# Patient Record
Sex: Female | Born: 1935
Health system: Southern US, Community
[De-identification: ages and names within clinical notes are randomized; demographics above are authoritative.]

## PROBLEM LIST (undated history)

## (undated) DIAGNOSIS — I10 Essential (primary) hypertension: Secondary | ICD-10-CM

## (undated) DIAGNOSIS — E079 Disorder of thyroid, unspecified: Secondary | ICD-10-CM

## (undated) HISTORY — DX: Essential (primary) hypertension: I10

## (undated) HISTORY — DX: Disorder of thyroid, unspecified: E07.9

## (undated) HISTORY — PX: APPENDECTOMY: SHX54

## (undated) MED FILL — Leucovorin Calcium For Inj 350 MG: INTRAMUSCULAR | Qty: 31.5 | Status: AC

## (undated) MED FILL — Fluorouracil IV Soln 5 GM/100ML (50 MG/ML): INTRAVENOUS | Qty: 76 | Status: AC

## (undated) MED FILL — Dexamethasone Sodium Phosphate Inj 100 MG/10ML: INTRAMUSCULAR | Qty: 1 | Status: AC

## (undated) MED FILL — Fluorouracil IV Soln 5 GM/100ML (50 MG/ML): INTRAVENOUS | Qty: 77 | Status: AC

## (undated) MED FILL — Fluorouracil IV Soln 2.5 GM/50ML (50 MG/ML): INTRAVENOUS | Qty: 13 | Status: AC

## (undated) MED FILL — Nivolumab IV Soln 100 MG/10ML: INTRAVENOUS | Qty: 24 | Status: AC

## (undated) MED FILL — Fosaprepitant Dimeglumine For IV Infusion 150 MG (Base Eq): INTRAVENOUS | Qty: 5 | Status: AC

---

## 1998-02-25 ENCOUNTER — Ambulatory Visit (HOSPITAL_COMMUNITY): Admission: RE | Admit: 1998-02-25 | Discharge: 1998-02-25 | Payer: Self-pay | Admitting: Family Medicine

## 1999-03-09 ENCOUNTER — Encounter: Payer: Self-pay | Admitting: Family Medicine

## 1999-03-09 ENCOUNTER — Encounter: Admission: RE | Admit: 1999-03-09 | Discharge: 1999-03-09 | Payer: Self-pay | Admitting: Family Medicine

## 2000-11-18 ENCOUNTER — Encounter: Payer: Self-pay | Admitting: Internal Medicine

## 2000-11-18 ENCOUNTER — Encounter: Admission: RE | Admit: 2000-11-18 | Discharge: 2000-11-18 | Payer: Self-pay | Admitting: Internal Medicine

## 2001-04-06 ENCOUNTER — Other Ambulatory Visit: Admission: RE | Admit: 2001-04-06 | Discharge: 2001-04-06 | Payer: Self-pay | Admitting: Internal Medicine

## 2001-06-27 ENCOUNTER — Ambulatory Visit (HOSPITAL_COMMUNITY): Admission: RE | Admit: 2001-06-27 | Discharge: 2001-06-27 | Payer: Self-pay | Admitting: Gastroenterology

## 2001-07-08 ENCOUNTER — Emergency Department (HOSPITAL_COMMUNITY): Admission: EM | Admit: 2001-07-08 | Discharge: 2001-07-08 | Payer: Self-pay

## 2002-01-16 ENCOUNTER — Encounter: Payer: Self-pay | Admitting: Internal Medicine

## 2002-01-16 ENCOUNTER — Encounter: Admission: RE | Admit: 2002-01-16 | Discharge: 2002-01-16 | Payer: Self-pay | Admitting: Internal Medicine

## 2003-01-15 ENCOUNTER — Encounter: Admission: RE | Admit: 2003-01-15 | Discharge: 2003-01-15 | Payer: Self-pay | Admitting: Internal Medicine

## 2003-01-30 ENCOUNTER — Encounter: Admission: RE | Admit: 2003-01-30 | Discharge: 2003-01-30 | Payer: Self-pay | Admitting: Internal Medicine

## 2004-07-29 ENCOUNTER — Encounter: Admission: RE | Admit: 2004-07-29 | Discharge: 2004-07-29 | Payer: Self-pay | Admitting: Internal Medicine

## 2005-02-19 ENCOUNTER — Encounter: Admission: RE | Admit: 2005-02-19 | Discharge: 2005-02-19 | Payer: Self-pay | Admitting: Internal Medicine

## 2005-08-23 ENCOUNTER — Encounter: Admission: RE | Admit: 2005-08-23 | Discharge: 2005-08-23 | Payer: Self-pay | Admitting: Internal Medicine

## 2006-09-28 ENCOUNTER — Encounter: Admission: RE | Admit: 2006-09-28 | Discharge: 2006-09-28 | Payer: Self-pay | Admitting: Internal Medicine

## 2008-11-29 ENCOUNTER — Encounter: Admission: RE | Admit: 2008-11-29 | Discharge: 2008-11-29 | Payer: Self-pay | Admitting: Internal Medicine

## 2010-03-07 ENCOUNTER — Encounter: Payer: Self-pay | Admitting: Internal Medicine

## 2010-04-14 ENCOUNTER — Other Ambulatory Visit: Payer: Self-pay | Admitting: Internal Medicine

## 2010-04-14 DIAGNOSIS — Z1231 Encounter for screening mammogram for malignant neoplasm of breast: Secondary | ICD-10-CM

## 2010-06-09 ENCOUNTER — Ambulatory Visit
Admission: RE | Admit: 2010-06-09 | Discharge: 2010-06-09 | Disposition: A | Discharge status: Home / Self Care | Payer: Medicare Other | Source: Ambulatory Visit | Attending: Internal Medicine | Admitting: Internal Medicine

## 2010-06-09 DIAGNOSIS — Z1231 Encounter for screening mammogram for malignant neoplasm of breast: Secondary | ICD-10-CM

## 2010-07-03 NOTE — Procedures (Signed)
[facility]  Patient:    Mackenzie Lewis, Mackenzie Lewis Visit Number: [id number] MRN: [id number]          Service Type: END Location: ENDO Attending Physician:  Charna Elizabeth Proc. Date: 06/27/01 Admit Date:  06/27/2001   CC:         Velna Hatchet, M.D.   Procedure Report  DATE OF BIRTH:  February 14, 2036.  PROCEDURE PERFORMED:  Colonoscopy.  ENDOSCOPIST:  Anselmo Rod, M.D.  INSTRUMENT USED:  Olympus colonoscope.  INDICATIONS FOR PROCEDURE:  Rectal bleeding in a 75 year old white female, rule out colonic polyps, masses, hemorrhoids, etc.  PREPROCEDURE PREPARATION:  Informed consent was procured from the patient. The patient was fasted for eight hours prior to the procedure, and prepped with a bottle of magnesium of citrate and a gallon of NuLytely the night prior to the procedure.  PREPROCEDURE PHYSICAL:  VITAL SIGNS:  The patient had stable vital signs.  NECK:  Supple.  CHEST:  Clear to auscultation.  CARDIAC:  S1 and S2 is regular.  ABDOMEN:  Soft with normal bowel sounds.  DESCRIPTION OF THE PROCEDURE:   The patient was placed in the left lateral decubitus position and sedated with 50 mg of Demerol and 5 mg of Versed intravenously.  Once the patient was adequately sedated and maintained on low-flow oxygen and continuous cardiac monitoring, the Olympus videocolonoscope was advanced from the rectum to the cecum without difficulty. Except for small nonbleeding internal hemorrhoids, no other abnormalities were seen.   The appendiceal orifice and the ileocecal valve were clearly visualized and photographed.  No masses, polyps, or diverticula were present.  IMPRESSION:  Normal colonoscopy except for small internal hemorrhoids.  RECOMMENDATIONS: 1. A high fiber diet has been recommended for the patient. 2. Repeat colorectal cancer screening is recommended in the next five years,    unless the patient were to develop any abnormal symptoms in the  interim. 3. If the patient has recurrent rectal bleeding in spite of a high    fiber diet, she should come to the office at the earliest. Attending Physician:  Charna Elizabeth DD:  06/27/01 TD:  06/28/01 Job: [id number]

## 2011-03-02 DIAGNOSIS — K5900 Constipation, unspecified: Secondary | ICD-10-CM | POA: Diagnosis not present

## 2011-03-02 DIAGNOSIS — R141 Gas pain: Secondary | ICD-10-CM | POA: Diagnosis not present

## 2011-05-25 DIAGNOSIS — E039 Hypothyroidism, unspecified: Secondary | ICD-10-CM | POA: Diagnosis not present

## 2011-05-25 DIAGNOSIS — E559 Vitamin D deficiency, unspecified: Secondary | ICD-10-CM | POA: Diagnosis not present

## 2011-05-25 DIAGNOSIS — I10 Essential (primary) hypertension: Secondary | ICD-10-CM | POA: Diagnosis not present

## 2011-05-25 DIAGNOSIS — Z0000 Encounter for general adult medical examination without abnormal findings: Secondary | ICD-10-CM | POA: Diagnosis not present

## 2011-05-25 DIAGNOSIS — R7989 Other specified abnormal findings of blood chemistry: Secondary | ICD-10-CM | POA: Diagnosis not present

## 2011-05-25 DIAGNOSIS — Z79899 Other long term (current) drug therapy: Secondary | ICD-10-CM | POA: Diagnosis not present

## 2011-05-25 DIAGNOSIS — I119 Hypertensive heart disease without heart failure: Secondary | ICD-10-CM | POA: Diagnosis not present

## 2011-05-25 DIAGNOSIS — R413 Other amnesia: Secondary | ICD-10-CM | POA: Diagnosis not present

## 2011-05-25 DIAGNOSIS — R945 Abnormal results of liver function studies: Secondary | ICD-10-CM | POA: Diagnosis not present

## 2011-06-04 DIAGNOSIS — Z1231 Encounter for screening mammogram for malignant neoplasm of breast: Secondary | ICD-10-CM | POA: Diagnosis not present

## 2011-11-24 DIAGNOSIS — M545 Low back pain, unspecified: Secondary | ICD-10-CM | POA: Diagnosis not present

## 2011-11-24 DIAGNOSIS — K219 Gastro-esophageal reflux disease without esophagitis: Secondary | ICD-10-CM | POA: Diagnosis not present

## 2011-11-24 DIAGNOSIS — I10 Essential (primary) hypertension: Secondary | ICD-10-CM | POA: Diagnosis not present

## 2011-11-24 DIAGNOSIS — Z79899 Other long term (current) drug therapy: Secondary | ICD-10-CM | POA: Diagnosis not present

## 2011-11-24 DIAGNOSIS — E039 Hypothyroidism, unspecified: Secondary | ICD-10-CM | POA: Diagnosis not present

## 2011-12-22 DIAGNOSIS — R0989 Other specified symptoms and signs involving the circulatory and respiratory systems: Secondary | ICD-10-CM | POA: Diagnosis not present

## 2011-12-22 DIAGNOSIS — G4710 Hypersomnia, unspecified: Secondary | ICD-10-CM | POA: Diagnosis not present

## 2011-12-22 DIAGNOSIS — R0609 Other forms of dyspnea: Secondary | ICD-10-CM | POA: Diagnosis not present

## 2011-12-22 DIAGNOSIS — G4733 Obstructive sleep apnea (adult) (pediatric): Secondary | ICD-10-CM | POA: Diagnosis not present

## 2011-12-25 DIAGNOSIS — Z79899 Other long term (current) drug therapy: Secondary | ICD-10-CM | POA: Diagnosis not present

## 2011-12-25 DIAGNOSIS — R0602 Shortness of breath: Secondary | ICD-10-CM | POA: Diagnosis not present

## 2011-12-25 DIAGNOSIS — G4733 Obstructive sleep apnea (adult) (pediatric): Secondary | ICD-10-CM | POA: Diagnosis not present

## 2011-12-25 DIAGNOSIS — E7800 Pure hypercholesterolemia, unspecified: Secondary | ICD-10-CM | POA: Diagnosis not present

## 2011-12-25 DIAGNOSIS — I10 Essential (primary) hypertension: Secondary | ICD-10-CM | POA: Diagnosis not present

## 2011-12-25 DIAGNOSIS — K219 Gastro-esophageal reflux disease without esophagitis: Secondary | ICD-10-CM | POA: Diagnosis not present

## 2011-12-25 DIAGNOSIS — E669 Obesity, unspecified: Secondary | ICD-10-CM | POA: Diagnosis not present

## 2011-12-27 DIAGNOSIS — R0602 Shortness of breath: Secondary | ICD-10-CM | POA: Diagnosis not present

## 2011-12-31 DIAGNOSIS — G4733 Obstructive sleep apnea (adult) (pediatric): Secondary | ICD-10-CM | POA: Diagnosis not present

## 2011-12-31 DIAGNOSIS — G4710 Hypersomnia, unspecified: Secondary | ICD-10-CM | POA: Diagnosis not present

## 2011-12-31 DIAGNOSIS — R0609 Other forms of dyspnea: Secondary | ICD-10-CM | POA: Diagnosis not present

## 2011-12-31 DIAGNOSIS — R05 Cough: Secondary | ICD-10-CM | POA: Diagnosis not present

## 2011-12-31 DIAGNOSIS — R059 Cough, unspecified: Secondary | ICD-10-CM | POA: Diagnosis not present

## 2011-12-31 DIAGNOSIS — R0989 Other specified symptoms and signs involving the circulatory and respiratory systems: Secondary | ICD-10-CM | POA: Diagnosis not present

## 2012-01-11 DIAGNOSIS — G4733 Obstructive sleep apnea (adult) (pediatric): Secondary | ICD-10-CM | POA: Diagnosis not present

## 2012-01-19 DIAGNOSIS — I10 Essential (primary) hypertension: Secondary | ICD-10-CM | POA: Diagnosis not present

## 2012-01-19 DIAGNOSIS — R0989 Other specified symptoms and signs involving the circulatory and respiratory systems: Secondary | ICD-10-CM | POA: Diagnosis not present

## 2012-01-19 DIAGNOSIS — E669 Obesity, unspecified: Secondary | ICD-10-CM | POA: Diagnosis not present

## 2012-01-19 DIAGNOSIS — G4730 Sleep apnea, unspecified: Secondary | ICD-10-CM | POA: Diagnosis not present

## 2012-01-19 DIAGNOSIS — R0609 Other forms of dyspnea: Secondary | ICD-10-CM | POA: Diagnosis not present

## 2012-02-01 DIAGNOSIS — I10 Essential (primary) hypertension: Secondary | ICD-10-CM | POA: Diagnosis not present

## 2012-02-10 DIAGNOSIS — I10 Essential (primary) hypertension: Secondary | ICD-10-CM | POA: Diagnosis not present

## 2012-02-10 DIAGNOSIS — R0609 Other forms of dyspnea: Secondary | ICD-10-CM | POA: Diagnosis not present

## 2012-02-10 DIAGNOSIS — R0989 Other specified symptoms and signs involving the circulatory and respiratory systems: Secondary | ICD-10-CM | POA: Diagnosis not present

## 2012-05-09 DIAGNOSIS — G4733 Obstructive sleep apnea (adult) (pediatric): Secondary | ICD-10-CM | POA: Diagnosis not present

## 2012-05-23 DIAGNOSIS — G4733 Obstructive sleep apnea (adult) (pediatric): Secondary | ICD-10-CM | POA: Diagnosis not present

## 2012-05-29 DIAGNOSIS — G4733 Obstructive sleep apnea (adult) (pediatric): Secondary | ICD-10-CM | POA: Diagnosis not present

## 2012-05-29 DIAGNOSIS — Z0000 Encounter for general adult medical examination without abnormal findings: Secondary | ICD-10-CM | POA: Diagnosis not present

## 2012-05-29 DIAGNOSIS — E559 Vitamin D deficiency, unspecified: Secondary | ICD-10-CM | POA: Diagnosis not present

## 2012-05-29 DIAGNOSIS — R7309 Other abnormal glucose: Secondary | ICD-10-CM | POA: Diagnosis not present

## 2012-05-29 DIAGNOSIS — R635 Abnormal weight gain: Secondary | ICD-10-CM | POA: Diagnosis not present

## 2012-05-29 DIAGNOSIS — R1084 Generalized abdominal pain: Secondary | ICD-10-CM | POA: Diagnosis not present

## 2012-05-29 DIAGNOSIS — I10 Essential (primary) hypertension: Secondary | ICD-10-CM | POA: Diagnosis not present

## 2012-05-29 DIAGNOSIS — E785 Hyperlipidemia, unspecified: Secondary | ICD-10-CM | POA: Diagnosis not present

## 2012-05-29 DIAGNOSIS — IMO0001 Reserved for inherently not codable concepts without codable children: Secondary | ICD-10-CM | POA: Diagnosis not present

## 2012-05-29 DIAGNOSIS — E039 Hypothyroidism, unspecified: Secondary | ICD-10-CM | POA: Diagnosis not present

## 2012-05-31 DIAGNOSIS — R1084 Generalized abdominal pain: Secondary | ICD-10-CM | POA: Diagnosis not present

## 2012-06-29 DIAGNOSIS — R32 Unspecified urinary incontinence: Secondary | ICD-10-CM | POA: Diagnosis not present

## 2012-06-29 DIAGNOSIS — Z0289 Encounter for other administrative examinations: Secondary | ICD-10-CM | POA: Diagnosis not present

## 2012-06-29 DIAGNOSIS — Z79899 Other long term (current) drug therapy: Secondary | ICD-10-CM | POA: Diagnosis not present

## 2012-06-29 DIAGNOSIS — I10 Essential (primary) hypertension: Secondary | ICD-10-CM | POA: Diagnosis not present

## 2012-07-04 DIAGNOSIS — G4733 Obstructive sleep apnea (adult) (pediatric): Secondary | ICD-10-CM | POA: Diagnosis not present

## 2012-08-08 DIAGNOSIS — Z1231 Encounter for screening mammogram for malignant neoplasm of breast: Secondary | ICD-10-CM | POA: Diagnosis not present

## 2012-09-11 DIAGNOSIS — M545 Low back pain, unspecified: Secondary | ICD-10-CM | POA: Diagnosis not present

## 2012-09-11 DIAGNOSIS — M25559 Pain in unspecified hip: Secondary | ICD-10-CM | POA: Diagnosis not present

## 2012-09-25 DIAGNOSIS — R109 Unspecified abdominal pain: Secondary | ICD-10-CM | POA: Diagnosis not present

## 2012-09-28 DIAGNOSIS — R109 Unspecified abdominal pain: Secondary | ICD-10-CM | POA: Diagnosis not present

## 2012-10-10 DIAGNOSIS — M545 Low back pain, unspecified: Secondary | ICD-10-CM | POA: Diagnosis not present

## 2012-11-29 DIAGNOSIS — I129 Hypertensive chronic kidney disease with stage 1 through stage 4 chronic kidney disease, or unspecified chronic kidney disease: Secondary | ICD-10-CM | POA: Diagnosis not present

## 2012-11-29 DIAGNOSIS — Z79899 Other long term (current) drug therapy: Secondary | ICD-10-CM | POA: Diagnosis not present

## 2012-11-29 DIAGNOSIS — E039 Hypothyroidism, unspecified: Secondary | ICD-10-CM | POA: Diagnosis not present

## 2012-11-29 DIAGNOSIS — N182 Chronic kidney disease, stage 2 (mild): Secondary | ICD-10-CM | POA: Diagnosis not present

## 2012-11-29 DIAGNOSIS — J3489 Other specified disorders of nose and nasal sinuses: Secondary | ICD-10-CM | POA: Diagnosis not present

## 2012-12-07 DIAGNOSIS — H2589 Other age-related cataract: Secondary | ICD-10-CM | POA: Diagnosis not present

## 2012-12-07 DIAGNOSIS — H04129 Dry eye syndrome of unspecified lacrimal gland: Secondary | ICD-10-CM | POA: Diagnosis not present

## 2012-12-07 DIAGNOSIS — H17829 Peripheral opacity of cornea, unspecified eye: Secondary | ICD-10-CM | POA: Diagnosis not present

## 2012-12-07 DIAGNOSIS — H43399 Other vitreous opacities, unspecified eye: Secondary | ICD-10-CM | POA: Diagnosis not present

## 2013-07-02 DIAGNOSIS — E559 Vitamin D deficiency, unspecified: Secondary | ICD-10-CM | POA: Diagnosis not present

## 2013-07-02 DIAGNOSIS — Z0000 Encounter for general adult medical examination without abnormal findings: Secondary | ICD-10-CM | POA: Diagnosis not present

## 2013-07-02 DIAGNOSIS — E785 Hyperlipidemia, unspecified: Secondary | ICD-10-CM | POA: Diagnosis not present

## 2013-07-02 DIAGNOSIS — N182 Chronic kidney disease, stage 2 (mild): Secondary | ICD-10-CM | POA: Diagnosis not present

## 2013-07-02 DIAGNOSIS — E039 Hypothyroidism, unspecified: Secondary | ICD-10-CM | POA: Diagnosis not present

## 2013-07-02 DIAGNOSIS — M545 Low back pain, unspecified: Secondary | ICD-10-CM | POA: Diagnosis not present

## 2013-07-02 DIAGNOSIS — I129 Hypertensive chronic kidney disease with stage 1 through stage 4 chronic kidney disease, or unspecified chronic kidney disease: Secondary | ICD-10-CM | POA: Diagnosis not present

## 2013-07-30 DIAGNOSIS — M6281 Muscle weakness (generalized): Secondary | ICD-10-CM | POA: Diagnosis not present

## 2013-07-30 DIAGNOSIS — M545 Low back pain, unspecified: Secondary | ICD-10-CM | POA: Diagnosis not present

## 2013-07-30 DIAGNOSIS — M533 Sacrococcygeal disorders, not elsewhere classified: Secondary | ICD-10-CM | POA: Diagnosis not present

## 2013-08-01 DIAGNOSIS — M545 Low back pain, unspecified: Secondary | ICD-10-CM | POA: Diagnosis not present

## 2013-08-01 DIAGNOSIS — M533 Sacrococcygeal disorders, not elsewhere classified: Secondary | ICD-10-CM | POA: Diagnosis not present

## 2013-08-01 DIAGNOSIS — M6281 Muscle weakness (generalized): Secondary | ICD-10-CM | POA: Diagnosis not present

## 2013-08-03 DIAGNOSIS — M545 Low back pain, unspecified: Secondary | ICD-10-CM | POA: Diagnosis not present

## 2013-08-03 DIAGNOSIS — M6281 Muscle weakness (generalized): Secondary | ICD-10-CM | POA: Diagnosis not present

## 2013-08-03 DIAGNOSIS — M533 Sacrococcygeal disorders, not elsewhere classified: Secondary | ICD-10-CM | POA: Diagnosis not present

## 2013-08-06 DIAGNOSIS — M6281 Muscle weakness (generalized): Secondary | ICD-10-CM | POA: Diagnosis not present

## 2013-08-06 DIAGNOSIS — M533 Sacrococcygeal disorders, not elsewhere classified: Secondary | ICD-10-CM | POA: Diagnosis not present

## 2013-08-06 DIAGNOSIS — M545 Low back pain, unspecified: Secondary | ICD-10-CM | POA: Diagnosis not present

## 2013-08-08 DIAGNOSIS — M545 Low back pain, unspecified: Secondary | ICD-10-CM | POA: Diagnosis not present

## 2013-08-08 DIAGNOSIS — M533 Sacrococcygeal disorders, not elsewhere classified: Secondary | ICD-10-CM | POA: Diagnosis not present

## 2013-08-08 DIAGNOSIS — M6281 Muscle weakness (generalized): Secondary | ICD-10-CM | POA: Diagnosis not present

## 2013-08-10 DIAGNOSIS — M545 Low back pain, unspecified: Secondary | ICD-10-CM | POA: Diagnosis not present

## 2013-08-10 DIAGNOSIS — M6281 Muscle weakness (generalized): Secondary | ICD-10-CM | POA: Diagnosis not present

## 2013-08-10 DIAGNOSIS — M533 Sacrococcygeal disorders, not elsewhere classified: Secondary | ICD-10-CM | POA: Diagnosis not present

## 2013-08-13 DIAGNOSIS — M545 Low back pain, unspecified: Secondary | ICD-10-CM | POA: Diagnosis not present

## 2013-08-13 DIAGNOSIS — M533 Sacrococcygeal disorders, not elsewhere classified: Secondary | ICD-10-CM | POA: Diagnosis not present

## 2013-08-13 DIAGNOSIS — M6281 Muscle weakness (generalized): Secondary | ICD-10-CM | POA: Diagnosis not present

## 2013-08-14 DIAGNOSIS — L821 Other seborrheic keratosis: Secondary | ICD-10-CM | POA: Diagnosis not present

## 2013-08-14 DIAGNOSIS — D1739 Benign lipomatous neoplasm of skin and subcutaneous tissue of other sites: Secondary | ICD-10-CM | POA: Diagnosis not present

## 2013-08-15 DIAGNOSIS — M6281 Muscle weakness (generalized): Secondary | ICD-10-CM | POA: Diagnosis not present

## 2013-08-15 DIAGNOSIS — M545 Low back pain, unspecified: Secondary | ICD-10-CM | POA: Diagnosis not present

## 2013-08-15 DIAGNOSIS — M533 Sacrococcygeal disorders, not elsewhere classified: Secondary | ICD-10-CM | POA: Diagnosis not present

## 2013-08-16 DIAGNOSIS — M545 Low back pain, unspecified: Secondary | ICD-10-CM | POA: Diagnosis not present

## 2013-08-16 DIAGNOSIS — M6281 Muscle weakness (generalized): Secondary | ICD-10-CM | POA: Diagnosis not present

## 2013-08-16 DIAGNOSIS — M533 Sacrococcygeal disorders, not elsewhere classified: Secondary | ICD-10-CM | POA: Diagnosis not present

## 2013-08-20 DIAGNOSIS — M545 Low back pain, unspecified: Secondary | ICD-10-CM | POA: Diagnosis not present

## 2013-08-20 DIAGNOSIS — M533 Sacrococcygeal disorders, not elsewhere classified: Secondary | ICD-10-CM | POA: Diagnosis not present

## 2013-08-20 DIAGNOSIS — M6281 Muscle weakness (generalized): Secondary | ICD-10-CM | POA: Diagnosis not present

## 2013-08-23 DIAGNOSIS — M545 Low back pain, unspecified: Secondary | ICD-10-CM | POA: Diagnosis not present

## 2013-08-23 DIAGNOSIS — M6281 Muscle weakness (generalized): Secondary | ICD-10-CM | POA: Diagnosis not present

## 2013-08-23 DIAGNOSIS — M533 Sacrococcygeal disorders, not elsewhere classified: Secondary | ICD-10-CM | POA: Diagnosis not present

## 2013-08-27 DIAGNOSIS — M6281 Muscle weakness (generalized): Secondary | ICD-10-CM | POA: Diagnosis not present

## 2013-08-27 DIAGNOSIS — M533 Sacrococcygeal disorders, not elsewhere classified: Secondary | ICD-10-CM | POA: Diagnosis not present

## 2013-08-27 DIAGNOSIS — M545 Low back pain, unspecified: Secondary | ICD-10-CM | POA: Diagnosis not present

## 2013-08-30 DIAGNOSIS — M545 Low back pain, unspecified: Secondary | ICD-10-CM | POA: Diagnosis not present

## 2013-08-30 DIAGNOSIS — M6281 Muscle weakness (generalized): Secondary | ICD-10-CM | POA: Diagnosis not present

## 2013-08-30 DIAGNOSIS — M533 Sacrococcygeal disorders, not elsewhere classified: Secondary | ICD-10-CM | POA: Diagnosis not present

## 2013-09-03 DIAGNOSIS — M545 Low back pain, unspecified: Secondary | ICD-10-CM | POA: Diagnosis not present

## 2013-09-03 DIAGNOSIS — M533 Sacrococcygeal disorders, not elsewhere classified: Secondary | ICD-10-CM | POA: Diagnosis not present

## 2013-09-03 DIAGNOSIS — M6281 Muscle weakness (generalized): Secondary | ICD-10-CM | POA: Diagnosis not present

## 2013-09-06 DIAGNOSIS — M545 Low back pain, unspecified: Secondary | ICD-10-CM | POA: Diagnosis not present

## 2013-09-06 DIAGNOSIS — M533 Sacrococcygeal disorders, not elsewhere classified: Secondary | ICD-10-CM | POA: Diagnosis not present

## 2013-09-06 DIAGNOSIS — M6281 Muscle weakness (generalized): Secondary | ICD-10-CM | POA: Diagnosis not present

## 2013-09-11 DIAGNOSIS — M6281 Muscle weakness (generalized): Secondary | ICD-10-CM | POA: Diagnosis not present

## 2013-09-11 DIAGNOSIS — M545 Low back pain, unspecified: Secondary | ICD-10-CM | POA: Diagnosis not present

## 2013-09-11 DIAGNOSIS — Z1231 Encounter for screening mammogram for malignant neoplasm of breast: Secondary | ICD-10-CM | POA: Diagnosis not present

## 2013-09-11 DIAGNOSIS — M533 Sacrococcygeal disorders, not elsewhere classified: Secondary | ICD-10-CM | POA: Diagnosis not present

## 2013-09-18 DIAGNOSIS — M6281 Muscle weakness (generalized): Secondary | ICD-10-CM | POA: Diagnosis not present

## 2013-09-18 DIAGNOSIS — M545 Low back pain, unspecified: Secondary | ICD-10-CM | POA: Diagnosis not present

## 2013-09-18 DIAGNOSIS — M533 Sacrococcygeal disorders, not elsewhere classified: Secondary | ICD-10-CM | POA: Diagnosis not present

## 2013-09-20 DIAGNOSIS — M545 Low back pain, unspecified: Secondary | ICD-10-CM | POA: Diagnosis not present

## 2013-09-20 DIAGNOSIS — M6281 Muscle weakness (generalized): Secondary | ICD-10-CM | POA: Diagnosis not present

## 2013-09-20 DIAGNOSIS — M533 Sacrococcygeal disorders, not elsewhere classified: Secondary | ICD-10-CM | POA: Diagnosis not present

## 2013-09-25 DIAGNOSIS — M545 Low back pain, unspecified: Secondary | ICD-10-CM | POA: Diagnosis not present

## 2013-09-25 DIAGNOSIS — M533 Sacrococcygeal disorders, not elsewhere classified: Secondary | ICD-10-CM | POA: Diagnosis not present

## 2013-09-25 DIAGNOSIS — M6281 Muscle weakness (generalized): Secondary | ICD-10-CM | POA: Diagnosis not present

## 2013-09-27 DIAGNOSIS — M545 Low back pain, unspecified: Secondary | ICD-10-CM | POA: Diagnosis not present

## 2013-09-27 DIAGNOSIS — M533 Sacrococcygeal disorders, not elsewhere classified: Secondary | ICD-10-CM | POA: Diagnosis not present

## 2013-09-27 DIAGNOSIS — M6281 Muscle weakness (generalized): Secondary | ICD-10-CM | POA: Diagnosis not present

## 2013-10-09 DIAGNOSIS — M6281 Muscle weakness (generalized): Secondary | ICD-10-CM | POA: Diagnosis not present

## 2013-10-09 DIAGNOSIS — M545 Low back pain, unspecified: Secondary | ICD-10-CM | POA: Diagnosis not present

## 2013-10-09 DIAGNOSIS — M533 Sacrococcygeal disorders, not elsewhere classified: Secondary | ICD-10-CM | POA: Diagnosis not present

## 2013-10-11 DIAGNOSIS — M533 Sacrococcygeal disorders, not elsewhere classified: Secondary | ICD-10-CM | POA: Diagnosis not present

## 2013-10-11 DIAGNOSIS — M6281 Muscle weakness (generalized): Secondary | ICD-10-CM | POA: Diagnosis not present

## 2013-10-11 DIAGNOSIS — M545 Low back pain, unspecified: Secondary | ICD-10-CM | POA: Diagnosis not present

## 2013-10-16 DIAGNOSIS — M545 Low back pain, unspecified: Secondary | ICD-10-CM | POA: Diagnosis not present

## 2013-10-16 DIAGNOSIS — M533 Sacrococcygeal disorders, not elsewhere classified: Secondary | ICD-10-CM | POA: Diagnosis not present

## 2013-10-16 DIAGNOSIS — M6281 Muscle weakness (generalized): Secondary | ICD-10-CM | POA: Diagnosis not present

## 2013-10-18 DIAGNOSIS — M533 Sacrococcygeal disorders, not elsewhere classified: Secondary | ICD-10-CM | POA: Diagnosis not present

## 2013-10-18 DIAGNOSIS — M6281 Muscle weakness (generalized): Secondary | ICD-10-CM | POA: Diagnosis not present

## 2013-10-18 DIAGNOSIS — M545 Low back pain, unspecified: Secondary | ICD-10-CM | POA: Diagnosis not present

## 2013-10-23 DIAGNOSIS — M545 Low back pain, unspecified: Secondary | ICD-10-CM | POA: Diagnosis not present

## 2013-10-23 DIAGNOSIS — M533 Sacrococcygeal disorders, not elsewhere classified: Secondary | ICD-10-CM | POA: Diagnosis not present

## 2013-10-23 DIAGNOSIS — M6281 Muscle weakness (generalized): Secondary | ICD-10-CM | POA: Diagnosis not present

## 2013-10-25 DIAGNOSIS — M533 Sacrococcygeal disorders, not elsewhere classified: Secondary | ICD-10-CM | POA: Diagnosis not present

## 2013-10-25 DIAGNOSIS — M545 Low back pain, unspecified: Secondary | ICD-10-CM | POA: Diagnosis not present

## 2013-10-25 DIAGNOSIS — M6281 Muscle weakness (generalized): Secondary | ICD-10-CM | POA: Diagnosis not present

## 2013-10-29 DIAGNOSIS — M545 Low back pain, unspecified: Secondary | ICD-10-CM | POA: Diagnosis not present

## 2013-10-29 DIAGNOSIS — M533 Sacrococcygeal disorders, not elsewhere classified: Secondary | ICD-10-CM | POA: Diagnosis not present

## 2013-10-29 DIAGNOSIS — M6281 Muscle weakness (generalized): Secondary | ICD-10-CM | POA: Diagnosis not present

## 2013-10-31 DIAGNOSIS — M6281 Muscle weakness (generalized): Secondary | ICD-10-CM | POA: Diagnosis not present

## 2013-10-31 DIAGNOSIS — M533 Sacrococcygeal disorders, not elsewhere classified: Secondary | ICD-10-CM | POA: Diagnosis not present

## 2013-10-31 DIAGNOSIS — M545 Low back pain, unspecified: Secondary | ICD-10-CM | POA: Diagnosis not present

## 2014-01-03 DIAGNOSIS — M85859 Other specified disorders of bone density and structure, unspecified thigh: Secondary | ICD-10-CM | POA: Diagnosis not present

## 2014-01-03 DIAGNOSIS — E039 Hypothyroidism, unspecified: Secondary | ICD-10-CM | POA: Diagnosis not present

## 2014-01-03 DIAGNOSIS — R7309 Other abnormal glucose: Secondary | ICD-10-CM | POA: Diagnosis not present

## 2014-01-03 DIAGNOSIS — R945 Abnormal results of liver function studies: Secondary | ICD-10-CM | POA: Diagnosis not present

## 2014-01-03 DIAGNOSIS — M79671 Pain in right foot: Secondary | ICD-10-CM | POA: Diagnosis not present

## 2014-01-03 DIAGNOSIS — Z13820 Encounter for screening for osteoporosis: Secondary | ICD-10-CM | POA: Diagnosis not present

## 2014-01-03 DIAGNOSIS — N182 Chronic kidney disease, stage 2 (mild): Secondary | ICD-10-CM | POA: Diagnosis not present

## 2014-01-03 DIAGNOSIS — I129 Hypertensive chronic kidney disease with stage 1 through stage 4 chronic kidney disease, or unspecified chronic kidney disease: Secondary | ICD-10-CM | POA: Diagnosis not present

## 2014-02-04 ENCOUNTER — Ambulatory Visit: Payer: Self-pay

## 2014-02-25 ENCOUNTER — Ambulatory Visit (INDEPENDENT_AMBULATORY_CARE_PROVIDER_SITE_OTHER): Payer: Medicare Other

## 2014-02-25 VITALS — BP 148/86 | HR 80 | Resp 12

## 2014-02-25 DIAGNOSIS — M7731 Calcaneal spur, right foot: Secondary | ICD-10-CM

## 2014-02-25 DIAGNOSIS — R52 Pain, unspecified: Secondary | ICD-10-CM

## 2014-02-25 DIAGNOSIS — M722 Plantar fascial fibromatosis: Secondary | ICD-10-CM

## 2014-02-25 NOTE — Progress Notes (Signed)
   Subjective:    Patient ID: Mackenzie Lewis, female    DOB: 03-29-1935, 79 y.o.   MRN: [id number]  HPI  PT STATED RT BOTTOM/BACK OF THE HEEL IS BEEN PAINFUL FOR 5 MONTHS. THE FOOT IS GETTING WORSE ESPECIALLY WHEN WALKING BAREFOOT. TRIED NO TREATMENT.  ALSO, BILATERAL THE WHOLE FOOT ARE ITCHING.  Review of Systems  HENT: Positive for sinus pressure.   Skin: Positive for color change.  All other systems reviewed and are negative.      Objective:   Physical Exam 79 year old white female well-developed well-nourished oriented 3 presents this time with pain of her right heel past 6 months pain especially first and first step in the morning refute tried going barefoot having pain in the inferior as well as inferior posterior junction of her right heel left heel and mid arch also having some tenderness on palpation consistent with early plantar fasciitis. Patient wearing Birkenstocks today actually the Birkenstock seem to help him feel the most comfortable she can't wear certain other shoes definite can go barefoot. Again pedal pulses are palpable DP and PT +2 over 4 Refill time 3 seconds all digits epicritic and proprioceptive sensations intact and symmetric there is normal plantar response DTRs not elicited      Assessment & Plan:  Assessment plantar fasciitis/heel spur syndrome right plan at this time fascial strapping applied to the right foot patient taking Aleve 1 daily does have a history of some GI issues with other medications however tolerates Aleve and will stay with that in the interim. May be candidate for orthoses in the future based on progress maintain good shoes such as Birkenstocks Brooks or a 6 her new balance athletic shoes are recommended. Exercise activities at this time try to moderate activities and avoid any ballistic activities. Recheck in 2 weeks for follow-up  Harriet Masson DPM

## 2014-02-25 NOTE — Patient Instructions (Signed)

## 2014-03-11 ENCOUNTER — Ambulatory Visit: Payer: Medicare Other

## 2014-03-28 ENCOUNTER — Ambulatory Visit: Payer: Medicare Other

## 2014-05-08 DIAGNOSIS — Z79899 Other long term (current) drug therapy: Secondary | ICD-10-CM | POA: Diagnosis not present

## 2014-05-08 DIAGNOSIS — R7309 Other abnormal glucose: Secondary | ICD-10-CM | POA: Diagnosis not present

## 2014-05-08 DIAGNOSIS — R945 Abnormal results of liver function studies: Secondary | ICD-10-CM | POA: Diagnosis not present

## 2014-05-08 DIAGNOSIS — I129 Hypertensive chronic kidney disease with stage 1 through stage 4 chronic kidney disease, or unspecified chronic kidney disease: Secondary | ICD-10-CM | POA: Diagnosis not present

## 2014-05-08 DIAGNOSIS — N182 Chronic kidney disease, stage 2 (mild): Secondary | ICD-10-CM | POA: Diagnosis not present

## 2014-05-27 DIAGNOSIS — R7989 Other specified abnormal findings of blood chemistry: Secondary | ICD-10-CM | POA: Diagnosis not present

## 2014-05-27 DIAGNOSIS — R945 Abnormal results of liver function studies: Secondary | ICD-10-CM | POA: Diagnosis not present

## 2014-06-18 DIAGNOSIS — K5900 Constipation, unspecified: Secondary | ICD-10-CM | POA: Diagnosis not present

## 2014-06-18 DIAGNOSIS — Z6836 Body mass index (BMI) 36.0-36.9, adult: Secondary | ICD-10-CM | POA: Diagnosis not present

## 2014-06-18 DIAGNOSIS — R350 Frequency of micturition: Secondary | ICD-10-CM | POA: Diagnosis not present

## 2014-06-18 DIAGNOSIS — I129 Hypertensive chronic kidney disease with stage 1 through stage 4 chronic kidney disease, or unspecified chronic kidney disease: Secondary | ICD-10-CM | POA: Diagnosis not present

## 2014-08-07 DIAGNOSIS — N182 Chronic kidney disease, stage 2 (mild): Secondary | ICD-10-CM | POA: Diagnosis not present

## 2014-08-07 DIAGNOSIS — K529 Noninfective gastroenteritis and colitis, unspecified: Secondary | ICD-10-CM | POA: Diagnosis not present

## 2014-08-07 DIAGNOSIS — R509 Fever, unspecified: Secondary | ICD-10-CM | POA: Diagnosis not present

## 2014-08-07 DIAGNOSIS — I129 Hypertensive chronic kidney disease with stage 1 through stage 4 chronic kidney disease, or unspecified chronic kidney disease: Secondary | ICD-10-CM | POA: Diagnosis not present

## 2014-08-07 DIAGNOSIS — Z79899 Other long term (current) drug therapy: Secondary | ICD-10-CM | POA: Diagnosis not present

## 2014-08-11 DIAGNOSIS — J069 Acute upper respiratory infection, unspecified: Secondary | ICD-10-CM | POA: Diagnosis not present

## 2014-10-04 DIAGNOSIS — Z1231 Encounter for screening mammogram for malignant neoplasm of breast: Secondary | ICD-10-CM | POA: Diagnosis not present

## 2014-10-29 DIAGNOSIS — M25561 Pain in right knee: Secondary | ICD-10-CM | POA: Diagnosis not present

## 2014-10-29 DIAGNOSIS — M2550 Pain in unspecified joint: Secondary | ICD-10-CM | POA: Diagnosis not present

## 2014-10-29 DIAGNOSIS — Z79899 Other long term (current) drug therapy: Secondary | ICD-10-CM | POA: Diagnosis not present

## 2014-10-29 DIAGNOSIS — E039 Hypothyroidism, unspecified: Secondary | ICD-10-CM | POA: Diagnosis not present

## 2014-10-29 DIAGNOSIS — E559 Vitamin D deficiency, unspecified: Secondary | ICD-10-CM | POA: Diagnosis not present

## 2014-10-29 DIAGNOSIS — M25562 Pain in left knee: Secondary | ICD-10-CM | POA: Diagnosis not present

## 2015-01-22 DIAGNOSIS — M25511 Pain in right shoulder: Secondary | ICD-10-CM | POA: Diagnosis not present

## 2015-01-22 DIAGNOSIS — M79671 Pain in right foot: Secondary | ICD-10-CM | POA: Diagnosis not present

## 2015-01-22 DIAGNOSIS — M79641 Pain in right hand: Secondary | ICD-10-CM | POA: Diagnosis not present

## 2015-01-22 DIAGNOSIS — R5381 Other malaise: Secondary | ICD-10-CM | POA: Diagnosis not present

## 2015-01-22 DIAGNOSIS — M79642 Pain in left hand: Secondary | ICD-10-CM | POA: Diagnosis not present

## 2015-01-22 DIAGNOSIS — M1712 Unilateral primary osteoarthritis, left knee: Secondary | ICD-10-CM | POA: Diagnosis not present

## 2015-01-22 DIAGNOSIS — M79672 Pain in left foot: Secondary | ICD-10-CM | POA: Diagnosis not present

## 2015-01-22 DIAGNOSIS — M2550 Pain in unspecified joint: Secondary | ICD-10-CM | POA: Diagnosis not present

## 2015-01-22 DIAGNOSIS — M25512 Pain in left shoulder: Secondary | ICD-10-CM | POA: Diagnosis not present

## 2015-01-22 DIAGNOSIS — Z79899 Other long term (current) drug therapy: Secondary | ICD-10-CM | POA: Diagnosis not present

## 2015-01-22 DIAGNOSIS — M1711 Unilateral primary osteoarthritis, right knee: Secondary | ICD-10-CM | POA: Diagnosis not present

## 2015-01-29 DIAGNOSIS — Z0000 Encounter for general adult medical examination without abnormal findings: Secondary | ICD-10-CM | POA: Diagnosis not present

## 2015-01-29 DIAGNOSIS — I129 Hypertensive chronic kidney disease with stage 1 through stage 4 chronic kidney disease, or unspecified chronic kidney disease: Secondary | ICD-10-CM | POA: Diagnosis not present

## 2015-01-29 DIAGNOSIS — N182 Chronic kidney disease, stage 2 (mild): Secondary | ICD-10-CM | POA: Diagnosis not present

## 2015-01-29 DIAGNOSIS — R7309 Other abnormal glucose: Secondary | ICD-10-CM | POA: Diagnosis not present

## 2015-01-29 DIAGNOSIS — M2550 Pain in unspecified joint: Secondary | ICD-10-CM | POA: Diagnosis not present

## 2015-02-20 DIAGNOSIS — M170 Bilateral primary osteoarthritis of knee: Secondary | ICD-10-CM | POA: Diagnosis not present

## 2015-02-20 DIAGNOSIS — M19271 Secondary osteoarthritis, right ankle and foot: Secondary | ICD-10-CM | POA: Diagnosis not present

## 2015-02-20 DIAGNOSIS — M19041 Primary osteoarthritis, right hand: Secondary | ICD-10-CM | POA: Diagnosis not present

## 2015-05-07 DIAGNOSIS — N182 Chronic kidney disease, stage 2 (mild): Secondary | ICD-10-CM | POA: Diagnosis not present

## 2015-05-07 DIAGNOSIS — I129 Hypertensive chronic kidney disease with stage 1 through stage 4 chronic kidney disease, or unspecified chronic kidney disease: Secondary | ICD-10-CM | POA: Diagnosis not present

## 2015-05-07 DIAGNOSIS — M25512 Pain in left shoulder: Secondary | ICD-10-CM | POA: Diagnosis not present

## 2015-05-07 DIAGNOSIS — R001 Bradycardia, unspecified: Secondary | ICD-10-CM | POA: Diagnosis not present

## 2015-06-18 DIAGNOSIS — R001 Bradycardia, unspecified: Secondary | ICD-10-CM | POA: Diagnosis not present

## 2015-07-17 DIAGNOSIS — R001 Bradycardia, unspecified: Secondary | ICD-10-CM | POA: Diagnosis not present

## 2015-08-07 DIAGNOSIS — E039 Hypothyroidism, unspecified: Secondary | ICD-10-CM | POA: Diagnosis not present

## 2015-08-07 DIAGNOSIS — N182 Chronic kidney disease, stage 2 (mild): Secondary | ICD-10-CM | POA: Diagnosis not present

## 2015-08-07 DIAGNOSIS — R7309 Other abnormal glucose: Secondary | ICD-10-CM | POA: Diagnosis not present

## 2015-08-07 DIAGNOSIS — I129 Hypertensive chronic kidney disease with stage 1 through stage 4 chronic kidney disease, or unspecified chronic kidney disease: Secondary | ICD-10-CM | POA: Diagnosis not present

## 2015-08-21 DIAGNOSIS — M19071 Primary osteoarthritis, right ankle and foot: Secondary | ICD-10-CM | POA: Diagnosis not present

## 2015-08-21 DIAGNOSIS — M19072 Primary osteoarthritis, left ankle and foot: Secondary | ICD-10-CM | POA: Diagnosis not present

## 2015-08-21 DIAGNOSIS — M19042 Primary osteoarthritis, left hand: Secondary | ICD-10-CM | POA: Diagnosis not present

## 2015-08-21 DIAGNOSIS — M19041 Primary osteoarthritis, right hand: Secondary | ICD-10-CM | POA: Diagnosis not present

## 2015-09-23 DIAGNOSIS — K219 Gastro-esophageal reflux disease without esophagitis: Secondary | ICD-10-CM | POA: Diagnosis not present

## 2015-09-23 DIAGNOSIS — E559 Vitamin D deficiency, unspecified: Secondary | ICD-10-CM | POA: Diagnosis not present

## 2015-09-23 DIAGNOSIS — R079 Chest pain, unspecified: Secondary | ICD-10-CM | POA: Diagnosis not present

## 2015-09-23 DIAGNOSIS — R202 Paresthesia of skin: Secondary | ICD-10-CM | POA: Diagnosis not present

## 2015-10-13 DIAGNOSIS — R6884 Jaw pain: Secondary | ICD-10-CM | POA: Diagnosis not present

## 2016-01-13 ENCOUNTER — Other Ambulatory Visit: Payer: Self-pay | Admitting: Rheumatology

## 2016-01-13 DIAGNOSIS — M2550 Pain in unspecified joint: Secondary | ICD-10-CM | POA: Diagnosis not present

## 2016-01-13 DIAGNOSIS — Z79899 Other long term (current) drug therapy: Secondary | ICD-10-CM | POA: Diagnosis not present

## 2016-01-13 LAB — CBC WITH DIFFERENTIAL/PLATELET
Basophils Absolute: 0 cells/uL (ref 0–200)
Basophils Relative: 0 %
Eosinophils Absolute: 213 cells/uL (ref 15–500)
Eosinophils Relative: 3 %
HCT: 44.2 % (ref 35.0–45.0)
Hemoglobin: 15.1 g/dL (ref 11.7–15.5)
Lymphocytes Relative: 32 %
Lymphs Abs: 2272 cells/uL (ref 850–3900)
MCH: 29.6 pg (ref 27.0–33.0)
MCHC: 34.2 g/dL (ref 32.0–36.0)
MCV: 86.7 fL (ref 80.0–100.0)
MPV: 10.2 fL (ref 7.5–12.5)
Monocytes Absolute: 710 cells/uL (ref 200–950)
Monocytes Relative: 10 %
Neutro Abs: 3905 cells/uL (ref 1500–7800)
Neutrophils Relative %: 55 %
Platelets: 201 10*3/uL (ref 140–400)
RBC: 5.1 MIL/uL (ref 3.80–5.10)
RDW: 13.4 % (ref 11.0–15.0)
WBC: 7.1 10*3/uL (ref 3.8–10.8)

## 2016-01-13 LAB — COMPLETE METABOLIC PANEL WITH GFR
ALT: 45 U/L — ABNORMAL HIGH (ref 6–29)
AST: 28 U/L (ref 10–35)
Albumin: 4.4 g/dL (ref 3.6–5.1)
Alkaline Phosphatase: 79 U/L (ref 33–130)
BUN: 15 mg/dL (ref 7–25)
CO2: 25 mmol/L (ref 20–31)
Calcium: 9.7 mg/dL (ref 8.6–10.4)
Chloride: 104 mmol/L (ref 98–110)
Creat: 0.89 mg/dL (ref 0.60–0.93)
GFR, Est African American: 72 mL/min (ref >=60)
GFR, Est Non African American: 62 mL/min (ref >=60)
Glucose, Bld: 109 mg/dL — ABNORMAL HIGH (ref 65–99)
Potassium: 4 mmol/L (ref 3.5–5.3)
Sodium: 141 mmol/L (ref 135–146)
Total Bilirubin: 0.5 mg/dL (ref 0.2–1.2)
Total Protein: 7.1 g/dL (ref 6.1–8.1)

## 2016-01-14 DIAGNOSIS — H04123 Dry eye syndrome of bilateral lacrimal glands: Secondary | ICD-10-CM | POA: Diagnosis not present

## 2016-01-14 DIAGNOSIS — H524 Presbyopia: Secondary | ICD-10-CM | POA: Diagnosis not present

## 2016-01-14 LAB — RHEUMATOID FACTOR: Rhuematoid fact SerPl-aCnc: <14 IU/mL (ref <14)

## 2016-01-14 LAB — ANA: Anti Nuclear Antibody(ANA): NEGATIVE

## 2016-01-14 LAB — CYCLIC CITRUL PEPTIDE ANTIBODY, IGG: Cyclic Citrullin Peptide Ab: 26 Units — ABNORMAL HIGH

## 2016-01-15 NOTE — Progress Notes (Signed)
CCP positive at 26 rest of the labs are normal. We will discuss at follow-up visit

## 2016-01-20 DIAGNOSIS — M170 Bilateral primary osteoarthritis of knee: Secondary | ICD-10-CM

## 2016-01-20 DIAGNOSIS — M19071 Primary osteoarthritis, right ankle and foot: Secondary | ICD-10-CM

## 2016-01-20 DIAGNOSIS — R7989 Other specified abnormal findings of blood chemistry: Secondary | ICD-10-CM | POA: Insufficient documentation

## 2016-01-20 DIAGNOSIS — R768 Other specified abnormal immunological findings in serum: Secondary | ICD-10-CM

## 2016-01-20 DIAGNOSIS — M19072 Primary osteoarthritis, left ankle and foot: Secondary | ICD-10-CM

## 2016-01-20 DIAGNOSIS — M19049 Primary osteoarthritis, unspecified hand: Secondary | ICD-10-CM | POA: Insufficient documentation

## 2016-01-20 HISTORY — DX: Other specified abnormal findings of blood chemistry: R79.89

## 2016-01-20 HISTORY — DX: Primary osteoarthritis, right ankle and foot: M19.072

## 2016-01-20 HISTORY — DX: Primary osteoarthritis, right ankle and foot: M19.071

## 2016-01-20 HISTORY — DX: Bilateral primary osteoarthritis of knee: M17.0

## 2016-01-20 HISTORY — DX: Other specified abnormal immunological findings in serum: R76.8

## 2016-01-20 HISTORY — DX: Primary osteoarthritis, unspecified hand: M19.049

## 2016-01-20 NOTE — Progress Notes (Signed)
Office Visit Note  Patient: Mackenzie Lewis             Date of Birth: 11-13-1935           MRN: [id number]             PCP: Maximino Greenland, MD Referring: Glendale Chard, MD Visit Date: 01/22/2016 Occupation: Retired salesperson    Subjective:  Hand Pain (left ring finger locks at times otherwise doing well)   History of Present Illness: Mackenzie Lewis is a 80 y.o. female with history of osteoarthritis. She states she's been having left fourth trigger finger she does sewing and some crafting which causes the finger lock but it gets better after massaging. She denies any joint swelling she has some discomfort in her shoulders. She states the knee joint pain and that feet pain is tolerable currently  Activities of Daily Living:  Patient reports morning stiffness for 10 minutes.   Patient Denies nocturnal pain.  Difficulty dressing/grooming: Denies Difficulty climbing stairs: Denies Difficulty getting out of chair: Denies Difficulty using hands for taps, buttons, cutlery, and/or writing: Reports   Review of Systems  Constitutional: Positive for fatigue. Negative for night sweats, weight gain, weight loss and weakness.  HENT: Negative for mouth sores, trouble swallowing, trouble swallowing, mouth dryness and nose dryness.   Eyes: Positive for dryness. Negative for pain, redness and visual disturbance.  Respiratory: Negative for cough, shortness of breath and difficulty breathing.   Cardiovascular: Negative for chest pain, palpitations, hypertension, irregular heartbeat and swelling in legs/feet.  Gastrointestinal: Negative for blood in stool, constipation and diarrhea.  Endocrine: Negative for increased urination.  Genitourinary: Negative for vaginal dryness.  Musculoskeletal: Positive for arthralgias, joint pain and morning stiffness. Negative for joint swelling, myalgias, muscle weakness, muscle tenderness and myalgias.  Skin: Negative for color change, rash, hair loss, skin  tightness, ulcers and sensitivity to sunlight.  Allergic/Immunologic: Negative for susceptible to infections.  Neurological: Negative for dizziness, memory loss and night sweats.  Hematological: Negative for swollen glands.  Psychiatric/Behavioral: Positive for sleep disturbance. Negative for depressed mood. The patient is not nervous/anxious.     PMFS History:  Patient Active Problem List   Diagnosis Date Noted  . Bilateral primary osteoarthritis of knee 01/20/2016  . Osteoarthritis of both feet 01/20/2016  . Osteoarthritis, hand 01/20/2016  . Cyclic citrullinated peptide (CCP) antibody positive 01/20/2016    Past Medical History:  Diagnosis Date  . Hypertension   . Thyroid disease     No family history on file. No past surgical history on file. Social History   Social History Narrative  . No narrative on file     Objective: Vital Signs: BP 134/70   Pulse 80   Resp 14   Ht 5\' 4"  (1.626 m)   Wt 213 lb (96.6 kg)   BMI 36.56 kg/m    Physical Exam  Constitutional: She is oriented to person, place, and time. She appears well-developed and well-nourished.  HENT:  Head: Normocephalic and atraumatic.  Eyes: Conjunctivae and EOM are normal.  Neck: Normal range of motion.  Cardiovascular: Normal rate, regular rhythm, normal heart sounds and intact distal pulses.   Pulmonary/Chest: Effort normal and breath sounds normal.  Abdominal: Soft. Bowel sounds are normal.  Lymphadenopathy:    She has no cervical adenopathy.  Neurological: She is alert and oriented to person, place, and time.  Skin: Skin is warm and dry. Capillary refill takes less than 2 seconds.  Psychiatric: She has a normal  mood and affect. Her behavior is normal.  Nursing note and vitals reviewed.    Musculoskeletal Exam: C-spine, thoracic, lumbar spine good range of motion. She has good range of motion of her shoulder joints elbow joints wrist joints she has some thickening of PIP/DIP joints. She has left  fourth trigger finger. Hip joints knee joints ankles MTPs PIPs with good range of motion. With no synovitis.  CDAI Exam: No CDAI exam completed.    Investigation: Findings:  01/27/2015   She has positive CCP(30), negative rheumatoid factor, negative ANA.   CMP, UA, CK, CCP, ACE level, uric acid, SPEP, G6PD, immunoglobulins, and acute hepatitis panel normal Parvovirus B19 IgG/IgM Abs       Parvovirus B19 IgG Abs  5.1 <0.9 index    Parvovirus B19 IgM Abs 0.1  <0.9 index  01/13/2016 CCP 26 rheumatoid factor negative, ANA negative, CMP normal, CBC normal     Imaging: No results found.  Speciality Comments: No specialty comments available.    Procedures:  No procedures performed Allergies: Sulfa antibiotics   Assessment / Plan:     Visit Diagnoses: Bilateral primary osteoarthritis of knee - Bilateral moderate with chondromalacia patella: She is doing fairly well without much discomfort in her knee joints she is overweight weight loss diet and exercise was discussed.  Primary osteoarthritis of both feet - With calcaneal spurs: Proper fitting shoes were discussed.  Primary osteoarthritis of both hands - Bilateral mild: Muscle strengthening and joint protection was discussed.  Trigger ring finger of left hand: I offered cortisone injection or Voltaren gel but patient declined. She states that she has some topical medication at home she will try that for right now. I've advised her to contact me in case her symptoms get worse.  Cyclic citrullinated peptide (CCP) antibody positive: She continues to have low titer positive antibody with no features of synovitis on examination.  Her other medical problems are as follows for which she seen by PCP:  Essential hypertension  Acquired hypothyroidism    Orders: No orders of the defined types were placed in this encounter.  No orders of the defined types were placed in this encounter.   Face-to-face time spent with patient was 30  minutes. 50% of time was spent in counseling and coordination of care.  Follow-Up Instructions: Return in about 6 months (around 07/22/2016) for Osteoarthritis.   Bo Merino, MD

## 2016-01-22 ENCOUNTER — Ambulatory Visit (INDEPENDENT_AMBULATORY_CARE_PROVIDER_SITE_OTHER): Payer: Medicare Other | Admitting: Rheumatology

## 2016-01-22 ENCOUNTER — Encounter: Payer: Self-pay | Admitting: Rheumatology

## 2016-01-22 ENCOUNTER — Ambulatory Visit: Payer: Medicare Other | Admitting: Rheumatology

## 2016-01-22 VITALS — BP 134/70 | HR 80 | Resp 14 | Ht 64.0 in | Wt 213.0 lb

## 2016-01-22 DIAGNOSIS — R7989 Other specified abnormal findings of blood chemistry: Secondary | ICD-10-CM | POA: Diagnosis not present

## 2016-01-22 DIAGNOSIS — M19041 Primary osteoarthritis, right hand: Secondary | ICD-10-CM | POA: Diagnosis not present

## 2016-01-22 DIAGNOSIS — M19042 Primary osteoarthritis, left hand: Secondary | ICD-10-CM | POA: Diagnosis not present

## 2016-01-22 DIAGNOSIS — E039 Hypothyroidism, unspecified: Secondary | ICD-10-CM

## 2016-01-22 DIAGNOSIS — M65342 Trigger finger, left ring finger: Secondary | ICD-10-CM | POA: Diagnosis not present

## 2016-01-22 DIAGNOSIS — M170 Bilateral primary osteoarthritis of knee: Secondary | ICD-10-CM

## 2016-01-22 DIAGNOSIS — I10 Essential (primary) hypertension: Secondary | ICD-10-CM | POA: Diagnosis not present

## 2016-01-22 DIAGNOSIS — M19071 Primary osteoarthritis, right ankle and foot: Secondary | ICD-10-CM | POA: Diagnosis not present

## 2016-01-22 DIAGNOSIS — M19072 Primary osteoarthritis, left ankle and foot: Secondary | ICD-10-CM

## 2016-01-22 DIAGNOSIS — R768 Other specified abnormal immunological findings in serum: Secondary | ICD-10-CM

## 2016-01-22 NOTE — Patient Instructions (Signed)
Supplements for OA Natural anti-inflammatories  You can purchase these at [employer], [employer] or online.  . Turmeric (capsules)  . Ginger (ginger root or capsules)  . Omega 3 (Fish, flax seeds, chia seeds, walnuts, almonds)  . Tart cherry (dried or extract)   Patient should be under the care of a physician while taking these supplements. This may not be reproduced without the permission of Dr. Kirkland Figg.  

## 2016-03-24 DIAGNOSIS — R002 Palpitations: Secondary | ICD-10-CM | POA: Diagnosis not present

## 2016-03-24 DIAGNOSIS — H9202 Otalgia, left ear: Secondary | ICD-10-CM | POA: Diagnosis not present

## 2016-03-24 DIAGNOSIS — E039 Hypothyroidism, unspecified: Secondary | ICD-10-CM | POA: Diagnosis not present

## 2016-03-24 DIAGNOSIS — I951 Orthostatic hypotension: Secondary | ICD-10-CM | POA: Diagnosis not present

## 2016-04-10 ENCOUNTER — Emergency Department (HOSPITAL_COMMUNITY): Payer: Medicare Other | Admitting: Certified Registered Nurse Anesthetist

## 2016-04-10 ENCOUNTER — Encounter (HOSPITAL_COMMUNITY): Payer: Self-pay | Admitting: Emergency Medicine

## 2016-04-10 ENCOUNTER — Emergency Department (HOSPITAL_COMMUNITY): Payer: Medicare Other

## 2016-04-10 ENCOUNTER — Encounter (HOSPITAL_COMMUNITY): Admission: EM | Disposition: A | Discharge status: Home / Self Care | Payer: Self-pay | Source: Home / Self Care

## 2016-04-10 ENCOUNTER — Inpatient Hospital Stay (HOSPITAL_COMMUNITY)
Admission: EM | Admit: 2016-04-10 | Discharge: 2016-04-13 | DRG: 340 | Disposition: A | Discharge status: Home / Self Care | Payer: Medicare Other | Attending: Surgery | Admitting: Surgery

## 2016-04-10 DIAGNOSIS — M170 Bilateral primary osteoarthritis of knee: Secondary | ICD-10-CM | POA: Diagnosis not present

## 2016-04-10 DIAGNOSIS — I10 Essential (primary) hypertension: Secondary | ICD-10-CM | POA: Diagnosis not present

## 2016-04-10 DIAGNOSIS — M19072 Primary osteoarthritis, left ankle and foot: Secondary | ICD-10-CM | POA: Diagnosis not present

## 2016-04-10 DIAGNOSIS — Z881 Allergy status to other antibiotic agents status: Secondary | ICD-10-CM | POA: Diagnosis not present

## 2016-04-10 DIAGNOSIS — Z7982 Long term (current) use of aspirin: Secondary | ICD-10-CM

## 2016-04-10 DIAGNOSIS — Z90710 Acquired absence of both cervix and uterus: Secondary | ICD-10-CM | POA: Diagnosis not present

## 2016-04-10 DIAGNOSIS — R1031 Right lower quadrant pain: Secondary | ICD-10-CM | POA: Diagnosis not present

## 2016-04-10 DIAGNOSIS — K353 Acute appendicitis with localized peritonitis, without perforation or gangrene: Secondary | ICD-10-CM

## 2016-04-10 DIAGNOSIS — Z79899 Other long term (current) drug therapy: Secondary | ICD-10-CM

## 2016-04-10 DIAGNOSIS — E039 Hypothyroidism, unspecified: Secondary | ICD-10-CM | POA: Diagnosis present

## 2016-04-10 DIAGNOSIS — M19071 Primary osteoarthritis, right ankle and foot: Secondary | ICD-10-CM | POA: Diagnosis not present

## 2016-04-10 DIAGNOSIS — M19049 Primary osteoarthritis, unspecified hand: Secondary | ICD-10-CM | POA: Diagnosis not present

## 2016-04-10 DIAGNOSIS — K3533 Acute appendicitis with perforation and localized peritonitis, with abscess: Secondary | ICD-10-CM

## 2016-04-10 DIAGNOSIS — K3580 Unspecified acute appendicitis: Secondary | ICD-10-CM | POA: Diagnosis not present

## 2016-04-10 DIAGNOSIS — K760 Fatty (change of) liver, not elsewhere classified: Secondary | ICD-10-CM | POA: Diagnosis not present

## 2016-04-10 DIAGNOSIS — R110 Nausea: Secondary | ICD-10-CM | POA: Diagnosis not present

## 2016-04-10 HISTORY — PX: LAPAROSCOPIC APPENDECTOMY: SHX408

## 2016-04-10 HISTORY — DX: Acute appendicitis with perforation, localized peritonitis, and gangrene, with abscess: K35.33

## 2016-04-10 LAB — URINALYSIS, ROUTINE W REFLEX MICROSCOPIC
Bacteria, UA: NONE SEEN
Bilirubin Urine: NEGATIVE
Glucose, UA: NEGATIVE mg/dL
Hgb urine dipstick: NEGATIVE
Ketones, ur: NEGATIVE mg/dL
Nitrite: NEGATIVE
Protein, ur: NEGATIVE mg/dL
Specific Gravity, Urine: 1.005 (ref 1.005–1.030)
Squamous Epithelial / LPF: NONE SEEN
pH: 6 (ref 5.0–8.0)

## 2016-04-10 LAB — COMPREHENSIVE METABOLIC PANEL
ALT: 43 U/L (ref 14–54)
AST: 25 U/L (ref 15–41)
Albumin: 4.2 g/dL (ref 3.5–5.0)
Alkaline Phosphatase: 85 U/L (ref 38–126)
Anion gap: 11 (ref 5–15)
BUN: 12 mg/dL (ref 6–20)
CO2: 25 mmol/L (ref 22–32)
Calcium: 9.6 mg/dL (ref 8.9–10.3)
Chloride: 102 mmol/L (ref 101–111)
Creatinine, Ser: 0.92 mg/dL (ref 0.44–1.00)
GFR calc Af Amer: >60 mL/min (ref >60)
GFR calc non Af Amer: 58 mL/min — ABNORMAL LOW (ref >60)
Glucose, Bld: 122 mg/dL — ABNORMAL HIGH (ref 65–99)
Potassium: 4.1 mmol/L (ref 3.5–5.1)
Sodium: 138 mmol/L (ref 135–145)
Total Bilirubin: 0.9 mg/dL (ref 0.3–1.2)
Total Protein: 7.6 g/dL (ref 6.5–8.1)

## 2016-04-10 LAB — CBC
HCT: 42.7 % (ref 36.0–46.0)
Hemoglobin: 14.7 g/dL (ref 12.0–15.0)
MCH: 29.6 pg (ref 26.0–34.0)
MCHC: 34.4 g/dL (ref 30.0–36.0)
MCV: 86.1 fL (ref 78.0–100.0)
Platelets: 198 10*3/uL (ref 150–400)
RBC: 4.96 MIL/uL (ref 3.87–5.11)
RDW: 12.9 % (ref 11.5–15.5)
WBC: 14.4 10*3/uL — ABNORMAL HIGH (ref 4.0–10.5)

## 2016-04-10 LAB — LIPASE, BLOOD: Lipase: 48 U/L (ref 11–51)

## 2016-04-10 SURGERY — APPENDECTOMY, LAPAROSCOPIC
Anesthesia: General | Site: Abdomen

## 2016-04-10 MED ORDER — FENTANYL CITRATE (PF) 100 MCG/2ML IJ SOLN
INTRAMUSCULAR | Status: AC
  Filled 2016-04-10: qty 2

## 2016-04-10 MED ORDER — ROCURONIUM BROMIDE 50 MG/5ML IV SOSY
PREFILLED_SYRINGE | INTRAVENOUS | Status: AC
  Filled 2016-04-10: qty 5

## 2016-04-10 MED ORDER — FENTANYL CITRATE (PF) 100 MCG/2ML IJ SOLN
50.0000 ug | INTRAMUSCULAR | Status: DC | PRN
  Administered 2016-04-10 (×6): 50 ug via INTRAVENOUS
  Filled 2016-04-10 (×2): qty 2

## 2016-04-10 MED ORDER — SODIUM CHLORIDE 0.9 % IV SOLN: Freq: Once | INTRAVENOUS | Status: DC

## 2016-04-10 MED ORDER — LIDOCAINE 2% (20 MG/ML) 5 ML SYRINGE
INTRAMUSCULAR | Status: AC
  Filled 2016-04-10: qty 5

## 2016-04-10 MED ORDER — PIPERACILLIN-TAZOBACTAM 3.375 G IVPB 30 MIN
3.3750 g | Freq: Once | INTRAVENOUS | Status: AC
  Administered 2016-04-10: 3.375 g via INTRAVENOUS
  Filled 2016-04-10: qty 50

## 2016-04-10 MED ORDER — BUPIVACAINE HCL 0.25 % IJ SOLN
INTRAMUSCULAR | Status: DC | PRN
  Administered 2016-04-10: 20 mL

## 2016-04-10 MED ORDER — OXYCODONE HCL 5 MG/5ML PO SOLN: 5.0000 mg | Freq: Once | ORAL | Status: DC | PRN

## 2016-04-10 MED ORDER — SUGAMMADEX SODIUM 200 MG/2ML IV SOLN
INTRAVENOUS | Status: AC
  Filled 2016-04-10: qty 2

## 2016-04-10 MED ORDER — PROPOFOL 10 MG/ML IV BOLUS
INTRAVENOUS | Status: DC | PRN
  Administered 2016-04-10: 140 mg via INTRAVENOUS

## 2016-04-10 MED ORDER — IOPAMIDOL (ISOVUE-300) INJECTION 61%
INTRAVENOUS | Status: AC
  Administered 2016-04-10: 100 mL
  Filled 2016-04-10: qty 100

## 2016-04-10 MED ORDER — PHENYLEPHRINE 40 MCG/ML (10ML) SYRINGE FOR IV PUSH (FOR BLOOD PRESSURE SUPPORT)
PREFILLED_SYRINGE | INTRAVENOUS | Status: AC
  Filled 2016-04-10: qty 10

## 2016-04-10 MED ORDER — SUCCINYLCHOLINE CHLORIDE 200 MG/10ML IV SOSY
PREFILLED_SYRINGE | INTRAVENOUS | Status: AC
  Filled 2016-04-10: qty 10

## 2016-04-10 MED ORDER — OXYCODONE HCL 5 MG PO TABS: 5.0000 mg | ORAL_TABLET | Freq: Once | ORAL | Status: DC | PRN

## 2016-04-10 MED ORDER — SUGAMMADEX SODIUM 200 MG/2ML IV SOLN
INTRAVENOUS | Status: DC | PRN
  Administered 2016-04-10: 200 mg via INTRAVENOUS

## 2016-04-10 MED ORDER — ARTIFICIAL TEARS OP OINT
TOPICAL_OINTMENT | OPHTHALMIC | Status: AC
  Filled 2016-04-10: qty 3.5

## 2016-04-10 MED ORDER — FENTANYL CITRATE (PF) 100 MCG/2ML IJ SOLN
INTRAMUSCULAR | Status: AC
  Administered 2016-04-10: 25 ug via INTRAVENOUS
  Filled 2016-04-10: qty 2

## 2016-04-10 MED ORDER — ONDANSETRON HCL 4 MG/2ML IJ SOLN
INTRAMUSCULAR | Status: DC | PRN
  Administered 2016-04-10: 4 mg via INTRAVENOUS

## 2016-04-10 MED ORDER — LACTATED RINGERS IV SOLN
INTRAVENOUS | Status: DC | PRN
  Administered 2016-04-10 (×2): via INTRAVENOUS

## 2016-04-10 MED ORDER — SODIUM CHLORIDE 0.9 % IV BOLUS (SEPSIS)
500.0000 mL | Freq: Once | INTRAVENOUS | Status: AC
  Administered 2016-04-10: 500 mL via INTRAVENOUS

## 2016-04-10 MED ORDER — FENTANYL CITRATE (PF) 100 MCG/2ML IJ SOLN
25.0000 ug | INTRAMUSCULAR | Status: DC | PRN
  Administered 2016-04-10 – 2016-04-11 (×3): 25 ug via INTRAVENOUS

## 2016-04-10 MED ORDER — BUPIVACAINE HCL (PF) 0.25 % IJ SOLN
INTRAMUSCULAR | Status: AC
  Filled 2016-04-10: qty 30

## 2016-04-10 MED ORDER — ROCURONIUM BROMIDE 100 MG/10ML IV SOLN
INTRAVENOUS | Status: DC | PRN
  Administered 2016-04-10: 40 mg via INTRAVENOUS

## 2016-04-10 MED ORDER — SODIUM CHLORIDE 0.9 % IR SOLN
Status: DC | PRN
  Administered 2016-04-10: 1000 mL

## 2016-04-10 MED ORDER — POTASSIUM CHLORIDE IN NACL 20-0.9 MEQ/L-% IV SOLN
INTRAVENOUS | Status: DC
  Administered 2016-04-11: 100 mL/h via INTRAVENOUS
  Administered 2016-04-12: 21:00:00 via INTRAVENOUS
  Filled 2016-04-10 (×4): qty 1000

## 2016-04-10 MED ORDER — 0.9 % SODIUM CHLORIDE (POUR BTL) OPTIME
TOPICAL | Status: DC | PRN
  Administered 2016-04-10: 1000 mL

## 2016-04-10 SURGICAL SUPPLY — 45 items
ADH SKN CLS APL DERMABOND .7 (GAUZE/BANDAGES/DRESSINGS) ×1
APPLIER CLIP 5 13 M/L LIGAMAX5 (MISCELLANEOUS) ×2
APPLIER CLIP ROT 10 11.4 M/L (STAPLE) ×2
APR CLP MED LRG 11.4X10 (STAPLE) ×1
APR CLP MED LRG 5 ANG JAW (MISCELLANEOUS) ×1
BAG SPEC RTRVL LRG 6X4 10 (ENDOMECHANICALS) ×1
CANISTER SUCT 3000ML PPV (MISCELLANEOUS) ×2 IMPLANT
CHLORAPREP W/TINT 26ML (MISCELLANEOUS) ×2 IMPLANT
CLIP APPLIE 5 13 M/L LIGAMAX5 (MISCELLANEOUS) IMPLANT
CLIP APPLIE ROT 10 11.4 M/L (STAPLE) IMPLANT
COVER SURGICAL LIGHT HANDLE (MISCELLANEOUS) ×2 IMPLANT
CUTTER FLEX LINEAR 45M (STAPLE) ×1 IMPLANT
DERMABOND ADVANCED (GAUZE/BANDAGES/DRESSINGS) ×1
DERMABOND ADVANCED .7 DNX12 (GAUZE/BANDAGES/DRESSINGS) ×1 IMPLANT
DRAIN CHANNEL 19F RND (DRAIN) ×1 IMPLANT
ELECT REM PT RETURN 9FT ADLT (ELECTROSURGICAL) ×2
ELECTRODE REM PT RTRN 9FT ADLT (ELECTROSURGICAL) ×1 IMPLANT
EVACUATOR SILICONE 100CC (DRAIN) ×1 IMPLANT
GLOVE SURG SIGNA 7.5 PF LTX (GLOVE) ×2 IMPLANT
GOWN STRL REUS W/ TWL LRG LVL3 (GOWN DISPOSABLE) ×2 IMPLANT
GOWN STRL REUS W/ TWL XL LVL3 (GOWN DISPOSABLE) ×1 IMPLANT
GOWN STRL REUS W/TWL LRG LVL3 (GOWN DISPOSABLE) ×2
GOWN STRL REUS W/TWL XL LVL3 (GOWN DISPOSABLE) ×2
KIT BASIN OR (CUSTOM PROCEDURE TRAY) ×2 IMPLANT
KIT ROOM TURNOVER OR (KITS) ×2 IMPLANT
NS IRRIG 1000ML POUR BTL (IV SOLUTION) ×2 IMPLANT
PAD ARMBOARD 7.5X6 YLW CONV (MISCELLANEOUS) ×4 IMPLANT
POUCH SPECIMEN RETRIEVAL 10MM (ENDOMECHANICALS) ×2 IMPLANT
RELOAD 45 VASCULAR/THIN (ENDOMECHANICALS) IMPLANT
RELOAD STAPLE 45 2.5 WHT GRN (ENDOMECHANICALS) IMPLANT
RELOAD STAPLE 45 3.5 BLU ETS (ENDOMECHANICALS) IMPLANT
RELOAD STAPLE TA45 3.5 REG BLU (ENDOMECHANICALS) ×4 IMPLANT
SET IRRIG TUBING LAPAROSCOPIC (IRRIGATION / IRRIGATOR) ×2 IMPLANT
SHEARS HARMONIC ACE PLUS 36CM (ENDOMECHANICALS) ×2 IMPLANT
SLEEVE ENDOPATH XCEL 5M (ENDOMECHANICALS) ×2 IMPLANT
SPECIMEN JAR SMALL (MISCELLANEOUS) ×2 IMPLANT
SUT MON AB 4-0 PC3 18 (SUTURE) ×2 IMPLANT
SUT SILK 2 0 FS (SUTURE) ×1 IMPLANT
SUT VICRYL 0 AB UR-6 (SUTURE) ×1 IMPLANT
TOWEL OR 17X24 6PK STRL BLUE (TOWEL DISPOSABLE) ×2 IMPLANT
TOWEL OR 17X26 10 PK STRL BLUE (TOWEL DISPOSABLE) ×2 IMPLANT
TRAY LAPAROSCOPIC MC (CUSTOM PROCEDURE TRAY) ×2 IMPLANT
TROCAR XCEL BLUNT TIP 100MML (ENDOMECHANICALS) ×2 IMPLANT
TROCAR XCEL NON-BLD 5MMX100MML (ENDOMECHANICALS) ×2 IMPLANT
TUBING INSUFFLATION (TUBING) ×2 IMPLANT

## 2016-04-10 NOTE — ED Notes (Signed)
Patient walked independently to the room.  No shortness of breath or increased pain.  Patient A&Ox4 at this time.

## 2016-04-10 NOTE — ED Notes (Signed)
Patient returned from CT

## 2016-04-10 NOTE — Op Note (Addendum)
APPENDECTOMY LAPAROSCOPIC  Procedure Note  LONYA SCHRENK 04/10/2016   Pre-op Diagnosis: Acute appendicitis     Post-op Diagnosis: acute appendicitis  Procedure(s): APPENDECTOMY LAPAROSCOPIC  Surgeon(s): Coralie Keens, MD  Anesthesia: General  Staff:  Circulator: Candie Mile, RN Scrub Person: Dennison Mascot Circulator Assistant: Tracey Harries, RN  Estimated Blood Loss: less than 50 mL               Specimens: sent to path   Findings:  Phlegmon in the RLQ with necrotic appendix.  Procedure: The patient was brought to the operating room and identified as the correct patient. She was placed supine on the operating table and general anesthesia was induced. Her abdomen was then prepped and draped in the usual sterile fashion. I made a small transverse incision at the lower edge of the umbilicus through a previous scar. I then took this down to the fascia which I opened with scalpel. I then used a hemostat to pass into the peritoneal cavity under direct vision. A 0 Vicryl person suture was then placed around the fascia opening. The high sun port was placed through the opening and insufflation of the abdomen was begun. I placed a 5 mm trocar the patient's right upper quadrant and a another in the left lower quadrant under direct vision. The cecum was identified. The patient had a phlegmon of the cecum. I had to peel the terminal ileum off of this area. I then could identify the base of the appendix but was difficult to tell initially with the rest of the appendix was. The base itself was soft. I used the suction device to bluntly dissect around the edges of the appendix going down into the phlegmon. I was able to dissect circumferentially around the base of the appendix and then transected with 2 separate firings of the laparoscopic stapling device. The appendix then came out in pieces as I removed it from the phlegmon. The mesial appendix appear to be taken down with the harmonic  scalpel. I evaluated the rest of the phlegmon and I felt like I got all the pieces the appendix out of the right lower quadrant. At this point I See her again the abdomen with saline. I made an incision the right lower quadrant and placed a 19 [place] Blake drain into the right lower quadrant. This was sutured in place with a nylon suture. Hemostasis appeared to be achieved. I removed the port of the umbilicus and tied the suture down to the fascial defect. I then removed the other trochars under direct vision and deflated the abdomen. All incisions were then anesthetized Marcaine and closed with 4-0 Monocryl subcuticular sutures. Skin glue was then applied. The patient tolerated procedure well. All the counts were correct at the end of the procedure. The patient was in a Betadine operating room and taken in a stable condition to the recovery room.      Naji Mehringer A   Date: 04/10/2016  Time: 11:16 PM

## 2016-04-10 NOTE — Anesthesia Preprocedure Evaluation (Signed)
Anesthesia Evaluation  Patient identified by MRN, date of birth, ID band Patient awake    Reviewed: Allergy & Precautions, NPO status , Patient's Chart, lab work & pertinent test results  History of Anesthesia Complications Negative for: history of anesthetic complications  Airway Mallampati: II  TM Distance: >3 FB Neck ROM: Full    Dental   Pulmonary neg pulmonary ROS,    breath sounds clear to auscultation       Cardiovascular hypertension, Pt. on medications  Rhythm:Regular     Neuro/Psych negative neurological ROS  negative psych ROS   GI/Hepatic negative GI ROS, Neg liver ROS,   Endo/Other  Hypothyroidism   Renal/GU negative Renal ROS     Musculoskeletal  (+) Arthritis ,   Abdominal   Peds  Hematology negative hematology ROS (+)   Anesthesia Other Findings   Reproductive/Obstetrics                             Anesthesia Physical Anesthesia Plan  ASA: II  Anesthesia Plan: General   Post-op Pain Management:    Induction: Intravenous  Airway Management Planned: Oral ETT  Additional Equipment: None  Intra-op Plan:   Post-operative Plan: Extubation in OR  Informed Consent: I have reviewed the patients History and Physical, chart, labs and discussed the procedure including the risks, benefits and alternatives for the proposed anesthesia with the patient or authorized representative who has indicated his/her understanding and acceptance.   Dental advisory given  Plan Discussed with: CRNA and Surgeon  Anesthesia Plan Comments:         Anesthesia Quick Evaluation

## 2016-04-10 NOTE — ED Notes (Signed)
MD Zavitz at bedside  

## 2016-04-10 NOTE — ED Notes (Signed)
Patient transported to CT 

## 2016-04-10 NOTE — Anesthesia Procedure Notes (Signed)
Procedure Name: Intubation Date/Time: 04/10/2016 10:18 PM Performed by: Hollie Salk Z Pre-anesthesia Checklist: Patient identified, Emergency Drugs available, Suction available and Patient being monitored Patient Re-evaluated:Patient Re-evaluated prior to inductionOxygen Delivery Method: Circle System Utilized Preoxygenation: Pre-oxygenation with 100% oxygen Intubation Type: IV induction Ventilation: Mask ventilation without difficulty Laryngoscope Size: Mac and 3 Grade View: Grade I Tube type: Oral Number of attempts: 1 Airway Equipment and Method: Stylet and Oral airway Placement Confirmation: ETT inserted through vocal cords under direct vision,  positive ETCO2 and breath sounds checked- equal and bilateral Secured at: 22 cm Tube secured with: Tape Dental Injury: Teeth and Oropharynx as per pre-operative assessment

## 2016-04-10 NOTE — ED Provider Notes (Addendum)
[facility] DEPT Provider Note   CSN: [id number] Arrival date & time: 04/10/16  1439     History   Chief Complaint Chief Complaint  Patient presents with  . Abdominal Pain    HPI Mackenzie Lewis is a 81 y.o. female.  Patient presents from urgent care for worsening right lower quadrant abdominal pain. Patient has hysterectomy history. Patient is high blood pressure history. Pain fairly constant gradually worsening the past few days. No fevers or chills. No blood in the stools. Ache sensation. No pain after eating.      Past Medical History:  Diagnosis Date  . Hypertension   . Thyroid disease     Patient Active Problem List   Diagnosis Date Noted  . Bilateral primary osteoarthritis of knee 01/20/2016  . Osteoarthritis of both feet 01/20/2016  . Osteoarthritis, hand 01/20/2016  . Cyclic citrullinated peptide (CCP) antibody positive 01/20/2016    History reviewed. No pertinent surgical history.  OB History    No data available       Home Medications    Prior to Admission medications   Medication Sig Start Date End Date Taking? Authorizing Provider  aspirin 81 MG EC tablet Take 81 mg by mouth daily. Swallow whole.    Historical Provider, MD  b complex vitamins capsule Take 1 capsule by mouth daily.    Historical Provider, MD  glucosamine-chondroitin 500-400 MG tablet Take 1 tablet by mouth 3 (three) times daily.    Historical Provider, MD  KRILL OIL PO Take by mouth.    Historical Provider, MD  levothyroxine (SYNTHROID, LEVOTHROID) 125 MCG tablet Take 125 mcg by mouth daily before breakfast.    Historical Provider, MD  Olmesartan-Amlodipine-HCTZ (TRIBENZOR PO) Take by mouth.    Historical Provider, MD  Probiotic Product (PROBIOTIC DAILY PO) Take by mouth.    Historical Provider, MD  Vitamin D, Ergocalciferol, (DRISDOL) 50000 UNITS CAPS capsule Take 50,000 Units by mouth every 7 (seven) days.    Historical Provider, MD    Family History No family history on  file.  Social History Social History  Substance Use Topics  . Smoking status: Never Smoker  . Smokeless tobacco: Never Used  . Alcohol use No     Allergies   Sulfa antibiotics   Review of Systems Review of Systems  Constitutional: Negative for chills and fever.  HENT: Negative for ear pain and sore throat.   Eyes: Negative for pain and visual disturbance.  Respiratory: Negative for cough and shortness of breath.   Cardiovascular: Negative for chest pain and palpitations.  Gastrointestinal: Positive for abdominal pain and nausea. Negative for vomiting.  Genitourinary: Negative for dysuria and hematuria.  Musculoskeletal: Negative for arthralgias and back pain.  Skin: Negative for color change and rash.  Neurological: Negative for seizures and syncope.  All other systems reviewed and are negative.    Physical Exam Updated Vital Signs BP 153/70   Pulse 99   Temp 98.7 F (37.1 C) (Oral)   Resp 22   Ht 5\' 4"  (1.626 m)   Wt 213 lb (96.6 kg)   SpO2 93%   BMI 36.56 kg/m   Physical Exam  Constitutional: She appears well-developed and well-nourished. No distress.  HENT:  Head: Normocephalic and atraumatic.  Eyes: Conjunctivae are normal.  Neck: Neck supple.  Cardiovascular: Normal rate and regular rhythm.   No murmur heard. Pulmonary/Chest: Effort normal and breath sounds normal. No respiratory distress.  Abdominal: Soft. There is tenderness (mild right mid and right lower quadrant).  Musculoskeletal: She exhibits no edema.  Neurological: She is alert.  Skin: Skin is warm and dry.  Psychiatric: She has a normal mood and affect.  Nursing note and vitals reviewed.    ED Treatments / Results  Labs (all labs ordered are listed, but only abnormal results are displayed) Labs Reviewed  COMPREHENSIVE METABOLIC PANEL - Abnormal; Notable for the following:       Result Value   Glucose, Bld 122 (*)    GFR calc non Af Amer 58 (*)    All other components within normal  limits  CBC - Abnormal; Notable for the following:    WBC 14.4 (*)    All other components within normal limits  URINALYSIS, ROUTINE W REFLEX MICROSCOPIC - Abnormal; Notable for the following:    Color, Urine STRAW (*)    Leukocytes, UA TRACE (*)    All other components within normal limits  CULTURE, BLOOD (ROUTINE X 2)  CULTURE, BLOOD (ROUTINE X 2)  LIPASE, BLOOD    EKG  EKG Interpretation  Date/Time:  Saturday April 10 2016 21:28:46 EST Ventricular Rate:  98 PR Interval:    QRS Duration: 107 QT Interval:  359 QTC Calculation: 459 R Axis:   39 Text Interpretation:  Sinus tachycardia Ventricular premature complex Prolonged PR interval Confirmed by Reather Converse MD, Vonna Kotyk [id number]) on 04/10/2016 9:38:19 PM       Radiology Ct Abdomen Pelvis W Contrast  Result Date: 04/10/2016 CLINICAL DATA:  Right lower quadrant abdominal pain EXAM: CT ABDOMEN AND PELVIS WITH CONTRAST TECHNIQUE: Multidetector CT imaging of the abdomen and pelvis was performed using the standard protocol following bolus administration of intravenous contrast. CONTRAST:  100 mL Isovue-300 intravenous COMPARISON:  Ultrasound abdomen 05/27/2014 FINDINGS: Lower chest: Mild scarring and fibrosis in the right middle lobe and subpleural right lower lobe anteriorly. 6 mm pulmonary nodule anterior left lung base. 7 mm pulmonary nodule posterior left lung base. Heart size nonenlarged. Hepatobiliary: Hepatic steatosis. No calcified gallstones. No biliary dilatation. Pancreas: Unremarkable. No pancreatic ductal dilatation or surrounding inflammatory changes. Spleen: Normal in size without focal abnormality. Adrenals/Urinary Tract: Adrenal glands are within normal limits. Kidneys show no focal abnormality. No ureteral stones. Bladder normal. 1.5 cm left posterior perinephric solid nodule. Stomach/Bowel: Stomach nonenlarged.  No dilated small bowel. Inflammatory process in the right lower quadrant. The appendix is enlarged, measuring up to 9  mm. The distal lumen and tip or poorly defined. Moderate phlegmonous change and inflammation. No extraluminal gas bubbles. Vascular/Lymphatic: Multiple enlarged right lower quadrant lymph nodes. Atherosclerosis of the aorta. Reproductive: Status post hysterectomy. No adnexal masses. Other: No free air.  Trace free fluid in the pelvis. Musculoskeletal: Degenerative changes of the spine. No acute or suspicious bone lesions. IMPRESSION: 1. Findings consistent with acute appendicitis. Moderate phlegmonous change and inflammatory response in the right lower quadrant around the appendix with poor definition of the distal lumen and tip, cannot exclude micro perforation. No extraluminal gas, free air, or abscess is visualized. 2. Multiple enlarged right lower quadrant lymph nodes, likely reactive. 3. 2 pulmonary nodules in the left lower lobe. No follow-up needed if patient is low-risk (and has no known or suspected primary neoplasm). Non-contrast chest CT can be considered in 12 months if patient is high-risk. This recommendation follows the consensus statement: Guidelines for Management of Incidental Pulmonary Nodules Detected on CT Images: From the Fleischner Society 2017; Radiology 2017; 284:228-243. 4. 1.5 cm solid left posterior periureteral nodule could represent an enlarged lymph node or possible small solid  exophytic renal lesion. 5. Hepatic steatosis Electronically Signed   By: Donavan Foil M.D.   On: 04/10/2016 19:30    Procedures Procedures (including critical care time)  Medications Ordered in ED Medications  fentaNYL (SUBLIMAZE) injection 50 mcg (50 mcg Intravenous Given 04/10/16 2030)  piperacillin-tazobactam (ZOSYN) IVPB 3.375 g (3.375 g Intravenous New Bag/Given 04/10/16 2120)  0.9 %  sodium chloride infusion (not administered)  sodium chloride 0.9 % bolus 500 mL (0 mLs Intravenous Stopped 04/10/16 1829)  iopamidol (ISOVUE-300) 61 % injection (100 mLs  Contrast Given 04/10/16 1836)     Initial  Impression / Assessment and Plan / ED Course  I have reviewed the triage vital signs and the nursing notes.  Pertinent labs & imaging results that were available during my care of the patient were reviewed by me and considered in my medical decision making (see chart for details).    Patient presents with right mid and lower abdominal pain. Discuss concern for appendicitis versus possible gallbladder however atypical symptoms. CT scan pain meds IV fluids.  CT scan confirms appendicitis. Blood cultures and Zosyn ordered. Discussed with surgeon for consult at 748.  The patients results and plan were reviewed and discussed.   Any x-rays performed were independently reviewed by myself.   Differential diagnosis were considered with the presenting HPI.  Medications  fentaNYL (SUBLIMAZE) injection 50 mcg (50 mcg Intravenous Given 04/10/16 2030)  piperacillin-tazobactam (ZOSYN) IVPB 3.375 g (3.375 g Intravenous New Bag/Given 04/10/16 2120)  0.9 %  sodium chloride infusion (not administered)  sodium chloride 0.9 % bolus 500 mL (0 mLs Intravenous Stopped 04/10/16 1829)  iopamidol (ISOVUE-300) 61 % injection (100 mLs  Contrast Given 04/10/16 1836)    Vitals:   04/10/16 2030 04/10/16 2045 04/10/16 2121 04/10/16 2128  BP: 146/72 143/82 153/70   Pulse: 97 99 95 99  Resp:    22  Temp:      TempSrc:      SpO2: 92% 94% 91% 93%  Weight:      Height:        Final diagnoses:  Acute appendicitis with localized peritonitis    Admission/ observation were discussed with the admitting physician, patient and/or family and they are comfortable with the plan.    Final Clinical Impressions(s) / ED Diagnoses   Final diagnoses:  Acute appendicitis with localized peritonitis    New Prescriptions New Prescriptions   No medications on file     Elnora Morrison, MD 04/10/16 1949    Elnora Morrison, MD 04/10/16 2139

## 2016-04-10 NOTE — ED Triage Notes (Signed)
Pt c/o abdominal pain that's been going on for awhile but has increased past few days. Pt also reports experiencing nausea. Pt was seen at urgent care in [place] and was told to come here for ultrasound.

## 2016-04-10 NOTE — H&P (Signed)
Mackenzie Lewis is an 81 y.o. female.   Chief Complaint: Right lower quadrant abdominal pain HPI: This is a pleasant 81 year old female who presents with a several-day history of worsening right lower quadrant abdominal pain. It started a clear and the umbilicus. The pain is now sharp and constant. It is moderate in severity. She has had no nausea or vomiting. Albumin 2 been normal. She is otherwise without complaints. She denies fever.  Past Medical History:  Diagnosis Date  . Hypertension   . Thyroid disease     History reviewed. No pertinent surgical history.  No family history on file. Social History:  reports that she has never smoked. She has never used smokeless tobacco. She reports that she does not drink alcohol or use drugs.  Allergies:  Allergies  Allergen Reactions  . Sulfa Antibiotics      (Not in a hospital admission)  Results for orders placed or performed during the hospital encounter of 04/10/16 (from the past 48 hour(s))  Lipase, blood     Status: None   Collection Time: 04/10/16  2:55 PM  Result Value Ref Range   Lipase 48 11 - 51 U/L  Comprehensive metabolic panel     Status: Abnormal   Collection Time: 04/10/16  2:55 PM  Result Value Ref Range   Sodium 138 135 - 145 mmol/L   Potassium 4.1 3.5 - 5.1 mmol/L   Chloride 102 101 - 111 mmol/L   CO2 25 22 - 32 mmol/L   Glucose, Bld 122 (H) 65 - 99 mg/dL   BUN 12 6 - 20 mg/dL   Creatinine, Ser 0.92 0.44 - 1.00 mg/dL   Calcium 9.6 8.9 - 10.3 mg/dL   Total Protein 7.6 6.5 - 8.1 g/dL   Albumin 4.2 3.5 - 5.0 g/dL   AST 25 15 - 41 U/L   ALT 43 14 - 54 U/L   Alkaline Phosphatase 85 38 - 126 U/L   Total Bilirubin 0.9 0.3 - 1.2 mg/dL   GFR calc non Af Amer 58 (L) >60 mL/min   GFR calc Af Amer >60 >60 mL/min    Comment: (NOTE) The eGFR has been calculated using the CKD EPI equation. This calculation has not been validated in all clinical situations. eGFR's persistently <60 mL/min signify possible Chronic  Kidney Disease.    Anion gap 11 5 - 15  CBC     Status: Abnormal   Collection Time: 04/10/16  2:55 PM  Result Value Ref Range   WBC 14.4 (H) 4.0 - 10.5 K/uL   RBC 4.96 3.87 - 5.11 MIL/uL   Hemoglobin 14.7 12.0 - 15.0 g/dL   HCT 42.7 36.0 - 46.0 %   MCV 86.1 78.0 - 100.0 fL   MCH 29.6 26.0 - 34.0 pg   MCHC 34.4 30.0 - 36.0 g/dL   RDW 12.9 11.5 - 15.5 %   Platelets 198 150 - 400 K/uL  Urinalysis, Routine w reflex microscopic     Status: Abnormal   Collection Time: 04/10/16  3:25 PM  Result Value Ref Range   Color, Urine STRAW (A) YELLOW   APPearance CLEAR CLEAR   Specific Gravity, Urine 1.005 1.005 - 1.030   pH 6.0 5.0 - 8.0   Glucose, UA NEGATIVE NEGATIVE mg/dL   Hgb urine dipstick NEGATIVE NEGATIVE   Bilirubin Urine NEGATIVE NEGATIVE   Ketones, ur NEGATIVE NEGATIVE mg/dL   Protein, ur NEGATIVE NEGATIVE mg/dL   Nitrite NEGATIVE NEGATIVE   Leukocytes, UA TRACE (A) NEGATIVE  RBC / HPF 0-5 0 - 5 RBC/hpf   WBC, UA 0-5 0 - 5 WBC/hpf   Bacteria, UA NONE SEEN NONE SEEN   Squamous Epithelial / LPF NONE SEEN NONE SEEN   Mucous PRESENT    Ct Abdomen Pelvis W Contrast  Result Date: 04/10/2016 CLINICAL DATA:  Right lower quadrant abdominal pain EXAM: CT ABDOMEN AND PELVIS WITH CONTRAST TECHNIQUE: Multidetector CT imaging of the abdomen and pelvis was performed using the standard protocol following bolus administration of intravenous contrast. CONTRAST:  100 mL Isovue-300 intravenous COMPARISON:  Ultrasound abdomen 05/27/2014 FINDINGS: Lower chest: Mild scarring and fibrosis in the right middle lobe and subpleural right lower lobe anteriorly. 6 mm pulmonary nodule anterior left lung base. 7 mm pulmonary nodule posterior left lung base. Heart size nonenlarged. Hepatobiliary: Hepatic steatosis. No calcified gallstones. No biliary dilatation. Pancreas: Unremarkable. No pancreatic ductal dilatation or surrounding inflammatory changes. Spleen: Normal in size without focal abnormality.  Adrenals/Urinary Tract: Adrenal glands are within normal limits. Kidneys show no focal abnormality. No ureteral stones. Bladder normal. 1.5 cm left posterior perinephric solid nodule. Stomach/Bowel: Stomach nonenlarged.  No dilated small bowel. Inflammatory process in the right lower quadrant. The appendix is enlarged, measuring up to 9 mm. The distal lumen and tip or poorly defined. Moderate phlegmonous change and inflammation. No extraluminal gas bubbles. Vascular/Lymphatic: Multiple enlarged right lower quadrant lymph nodes. Atherosclerosis of the aorta. Reproductive: Status post hysterectomy. No adnexal masses. Other: No free air.  Trace free fluid in the pelvis. Musculoskeletal: Degenerative changes of the spine. No acute or suspicious bone lesions. IMPRESSION: 1. Findings consistent with acute appendicitis. Moderate phlegmonous change and inflammatory response in the right lower quadrant around the appendix with poor definition of the distal lumen and tip, cannot exclude micro perforation. No extraluminal gas, free air, or abscess is visualized. 2. Multiple enlarged right lower quadrant lymph nodes, likely reactive. 3. 2 pulmonary nodules in the left lower lobe. No follow-up needed if patient is low-risk (and has no known or suspected primary neoplasm). Non-contrast chest CT can be considered in 12 months if patient is high-risk. This recommendation follows the consensus statement: Guidelines for Management of Incidental Pulmonary Nodules Detected on CT Images: From the Fleischner Society 2017; Radiology 2017; 284:228-243. 4. 1.5 cm solid left posterior periureteral nodule could represent an enlarged lymph node or possible small solid exophytic renal lesion. 5. Hepatic steatosis Electronically Signed   By: Donavan Foil M.D.   On: 04/10/2016 19:30    Review of Systems  Constitutional: Negative for chills and fever.  HENT: Negative for congestion.   Eyes: Negative for blurred vision.  Respiratory:  Negative for cough, shortness of breath and stridor.   Cardiovascular: Negative for chest pain.  Gastrointestinal: Positive for abdominal pain. Negative for nausea and vomiting.  Genitourinary: Negative for dysuria.  All other systems reviewed and are negative.   Blood pressure 155/81, pulse 95, temperature 98.7 F (37.1 C), temperature source Oral, resp. rate 18, height '5\' 4"'  (1.626 m), weight 96.6 kg (213 lb), SpO2 93 %. Physical Exam  Constitutional: She is oriented to person, place, and time. She appears well-developed and well-nourished. No distress.  HENT:  Head: Normocephalic and atraumatic.  Right Ear: External ear normal.  Left Ear: External ear normal.  Nose: Nose normal.  Mouth/Throat: Oropharynx is clear and moist. No oropharyngeal exudate.  Eyes: Conjunctivae are normal. Pupils are equal, round, and reactive to light. Right eye exhibits no discharge. Left eye exhibits no discharge. No scleral icterus.  Neck: Normal range of motion. No tracheal deviation present.  Cardiovascular: Normal rate, regular rhythm, normal heart sounds and intact distal pulses.   No murmur heard. Respiratory: Effort normal and breath sounds normal. She has no wheezes. She exhibits no tenderness.  GI: Soft. There is tenderness. There is guarding.  There is moderate tenderness with guarding in the right lower quadrant  Musculoskeletal: Normal range of motion. She exhibits no edema.  Lymphadenopathy:    She has no cervical adenopathy.  Neurological: She is alert and oriented to person, place, and time.  Skin: Skin is warm and dry. She is not diaphoretic. No erythema.  Psychiatric: Her behavior is normal. Judgment normal.     Assessment/Plan Acute appendicitis  I explained the diagnosis to the patient. Appendectomy is recommended. I will attempt a laparoscopically. She has a moderate phlegmon and perforation cannot be ruled out. I discussed the surgical procedure with her in detail. I discussed the  risks which includes but is not limited to bleeding, infection, injury to stranding structures, the need to convert to an open procedure, cardiopulmonary issues, DVT, postoperative recovery, etc. She understands and agrees to proceed with surgery  Tellis Spivak A, MD 04/10/2016, 8:38 PM

## 2016-04-10 NOTE — Transfer of Care (Signed)
Immediate Anesthesia Transfer of Care Note  Patient: Mackenzie Lewis  Procedure(s) Performed: Procedure(s): APPENDECTOMY LAPAROSCOPIC (N/A)  Patient Location: PACU  Anesthesia Type:General  Level of Consciousness: awake, alert , oriented and patient cooperative  Airway & Oxygen Therapy: Patient Spontanous Breathing and Patient connected to nasal cannula oxygen  Post-op Assessment: Report given to RN and Post -op Vital signs reviewed and stable  Post vital signs: Reviewed and stable  Last Vitals:  Vitals:   04/10/16 2121 04/10/16 2128  BP: 153/70   Pulse: 95 99  Resp:  22  Temp:      Last Pain:  Vitals:   04/10/16 2129  TempSrc:   PainSc: 4          Complications: No apparent anesthesia complications

## 2016-04-11 LAB — CBC
HCT: 40.1 % (ref 36.0–46.0)
Hemoglobin: 13.6 g/dL (ref 12.0–15.0)
MCH: 29.2 pg (ref 26.0–34.0)
MCHC: 33.9 g/dL (ref 30.0–36.0)
MCV: 86.1 fL (ref 78.0–100.0)
Platelets: 178 10*3/uL (ref 150–400)
RBC: 4.66 MIL/uL (ref 3.87–5.11)
RDW: 13.1 % (ref 11.5–15.5)
WBC: 14 10*3/uL — ABNORMAL HIGH (ref 4.0–10.5)

## 2016-04-11 LAB — BASIC METABOLIC PANEL
Anion gap: 8 (ref 5–15)
BUN: 7 mg/dL (ref 6–20)
CO2: 28 mmol/L (ref 22–32)
Calcium: 9.2 mg/dL (ref 8.9–10.3)
Chloride: 102 mmol/L (ref 101–111)
Creatinine, Ser: 0.92 mg/dL (ref 0.44–1.00)
GFR calc Af Amer: >60 mL/min (ref >60)
GFR calc non Af Amer: 58 mL/min — ABNORMAL LOW (ref >60)
Glucose, Bld: 137 mg/dL — ABNORMAL HIGH (ref 65–99)
Potassium: 4.1 mmol/L (ref 3.5–5.1)
Sodium: 138 mmol/L (ref 135–145)

## 2016-04-11 MED ORDER — DIPHENHYDRAMINE HCL 50 MG/ML IJ SOLN: 12.5000 mg | Freq: Four times a day (QID) | INTRAMUSCULAR | Status: DC | PRN

## 2016-04-11 MED ORDER — ALUM & MAG HYDROXIDE-SIMETH 200-200-20 MG/5ML PO SUSP
30.0000 mL | ORAL | Status: DC | PRN
  Administered 2016-04-11 – 2016-04-12 (×2): 30 mL via ORAL
  Filled 2016-04-11 (×2): qty 30

## 2016-04-11 MED ORDER — LEVOTHYROXINE SODIUM 25 MCG PO TABS
125.0000 ug | ORAL_TABLET | Freq: Every day | ORAL | Status: DC
  Filled 2016-04-11: qty 1

## 2016-04-11 MED ORDER — ONDANSETRON HCL 4 MG/2ML IJ SOLN: 4.0000 mg | Freq: Four times a day (QID) | INTRAMUSCULAR | Status: DC | PRN

## 2016-04-11 MED ORDER — LEVOTHYROXINE SODIUM 75 MCG PO TABS
75.0000 ug | ORAL_TABLET | Freq: Every day | ORAL | Status: DC
  Administered 2016-04-11 – 2016-04-13 (×3): 75 ug via ORAL
  Filled 2016-04-11 (×3): qty 1

## 2016-04-11 MED ORDER — MORPHINE SULFATE (PF) 2 MG/ML IV SOLN
1.0000 mg | INTRAVENOUS | Status: DC | PRN
  Administered 2016-04-11 (×4): 2 mg via INTRAVENOUS
  Administered 2016-04-11: 1 mg via INTRAVENOUS
  Filled 2016-04-11 (×5): qty 1

## 2016-04-11 MED ORDER — ONDANSETRON 4 MG PO TBDP
4.0000 mg | ORAL_TABLET | Freq: Four times a day (QID) | ORAL | Status: DC | PRN
  Filled 2016-04-11: qty 1

## 2016-04-11 MED ORDER — HYDROCODONE-ACETAMINOPHEN 5-325 MG PO TABS
1.0000 | ORAL_TABLET | ORAL | Status: DC | PRN
  Administered 2016-04-11 – 2016-04-13 (×5): 1 via ORAL
  Filled 2016-04-11 (×6): qty 1

## 2016-04-11 MED ORDER — DIPHENHYDRAMINE HCL 12.5 MG/5ML PO ELIX: 12.5000 mg | ORAL_SOLUTION | Freq: Four times a day (QID) | ORAL | Status: DC | PRN

## 2016-04-11 MED ORDER — ENOXAPARIN SODIUM 40 MG/0.4ML ~~LOC~~ SOLN
40.0000 mg | SUBCUTANEOUS | Status: DC
  Administered 2016-04-11 – 2016-04-13 (×3): 40 mg via SUBCUTANEOUS
  Filled 2016-04-11 (×3): qty 0.4

## 2016-04-11 MED ORDER — PIPERACILLIN-TAZOBACTAM 3.375 G IVPB
3.3750 g | Freq: Three times a day (TID) | INTRAVENOUS | Status: DC
  Administered 2016-04-11 – 2016-04-13 (×8): 3.375 g via INTRAVENOUS
  Filled 2016-04-11 (×9): qty 50

## 2016-04-11 MED ORDER — DOCUSATE SODIUM 100 MG PO CAPS
100.0000 mg | ORAL_CAPSULE | Freq: Two times a day (BID) | ORAL | Status: DC
  Administered 2016-04-11 – 2016-04-13 (×4): 100 mg via ORAL
  Filled 2016-04-11 (×4): qty 1

## 2016-04-11 NOTE — Progress Notes (Signed)
  Progress Note: General Surgery Service   Subjective: Pain in lower abdomen, no nausea or vomiting  Objective: Vital signs in last 24 hours: Temp:  [98.1 F (36.7 C)-99.1 F (37.3 C)] 99.1 F (37.3 C) (02/25 0527) Pulse Rate:  [60-114] 101 (02/25 0527) Resp:  [12-22] 16 (02/25 0213) BP: (139-171)/(62-82) 149/67 (02/25 0527) SpO2:  [91 %-95 %] 95 % (02/25 0527) Weight:  [96.6 kg (213 lb)] 96.6 kg (213 lb) (02/24 1452) Last BM Date: 04/10/16  Intake/Output from previous day: 02/24 0701 - 02/25 0700 In: 1361.7 [I.V.:1311.7; IV Piggyback:50] Out: 60 [Drains:30; Blood:30] Intake/Output this shift: No intake/output data recorded.  Lungs: CTAB  Cardiovascular: tachycardic  Abd: soft, NT, ND, bulb with SS drainage  Extremities: no edema  Neuro: AOx4  Lab Results: CBC   Recent Labs  04/10/16 1455 04/11/16 0448  WBC 14.4* 14.0*  HGB 14.7 13.6  HCT 42.7 40.1  PLT 198 178   BMET  Recent Labs  04/10/16 1455 04/11/16 0448  NA 138 138  K 4.1 4.1  CL 102 102  CO2 25 28  GLUCOSE 122* 137*  BUN 12 7  CREATININE 0.92 0.92  CALCIUM 9.6 9.2   PT/INR No results for input(s): LABPROT, INR in the last 72 hours. ABG No results for input(s): PHART, HCO3 in the last 72 hours.  Invalid input(s): PCO2, PO2  Studies/Results:  Anti-infectives: Anti-infectives    Start     Dose/Rate Route Frequency Ordered Stop   04/11/16 0400  piperacillin-tazobactam (ZOSYN) IVPB 3.375 g     3.375 g 12.5 mL/hr over 240 Minutes Intravenous Every 8 hours 04/11/16 0052     04/10/16 2000  piperacillin-tazobactam (ZOSYN) IVPB 3.375 g     3.375 g 100 mL/hr over 30 Minutes Intravenous  Once 04/10/16 1949 04/10/16 2150      Medications: Scheduled Meds: . sodium chloride   Intravenous Once  . enoxaparin (LOVENOX) injection  40 mg Subcutaneous Q24H  . levothyroxine  75 mcg Oral QAC breakfast  . piperacillin-tazobactam (ZOSYN)  IV  3.375 g Intravenous Q8H   Continuous Infusions: .  0.9 % NaCl with KCl 20 mEq / L 100 mL/hr (04/11/16 0120)   PRN Meds:.diphenhydrAMINE **OR** diphenhydrAMINE, fentaNYL (SUBLIMAZE) injection, HYDROcodone-acetaminophen, morphine injection, ondansetron **OR** ondansetron (ZOFRAN) IV  Assessment/Plan: Patient Active Problem List   Diagnosis Date Noted  . Appendicitis with peritonitis 04/10/2016  . Bilateral primary osteoarthritis of knee 01/20/2016  . Osteoarthritis of both feet 01/20/2016  . Osteoarthritis, hand 01/20/2016  . Cyclic citrullinated peptide (CCP) antibody positive 01/20/2016   s/p Procedure(s): APPENDECTOMY LAPAROSCOPIC 04/10/2016 With peritonitis -continue abx -continue drain -advance diet -cbc in am    LOS: 1 day   Mickeal Skinner, MD Pg# [phone] [facility]

## 2016-04-11 NOTE — Progress Notes (Signed)
Placed patient on CPAP with pressure set 6cm and oxygen set at 3lpm

## 2016-04-12 ENCOUNTER — Encounter (HOSPITAL_COMMUNITY): Payer: Self-pay | Admitting: Surgery

## 2016-04-12 LAB — BASIC METABOLIC PANEL
Anion gap: 5 (ref 5–15)
BUN: 7 mg/dL (ref 6–20)
CO2: 29 mmol/L (ref 22–32)
Calcium: 8.6 mg/dL — ABNORMAL LOW (ref 8.9–10.3)
Chloride: 104 mmol/L (ref 101–111)
Creatinine, Ser: 1.01 mg/dL — ABNORMAL HIGH (ref 0.44–1.00)
GFR calc Af Amer: >60 mL/min (ref >60)
GFR calc non Af Amer: 52 mL/min — ABNORMAL LOW (ref >60)
Glucose, Bld: 135 mg/dL — ABNORMAL HIGH (ref 65–99)
Potassium: 3.9 mmol/L (ref 3.5–5.1)
Sodium: 138 mmol/L (ref 135–145)

## 2016-04-12 LAB — CBC
HCT: 37.1 % (ref 36.0–46.0)
Hemoglobin: 12.7 g/dL (ref 12.0–15.0)
MCH: 29.9 pg (ref 26.0–34.0)
MCHC: 34.2 g/dL (ref 30.0–36.0)
MCV: 87.3 fL (ref 78.0–100.0)
Platelets: 160 10*3/uL (ref 150–400)
RBC: 4.25 MIL/uL (ref 3.87–5.11)
RDW: 13.3 % (ref 11.5–15.5)
WBC: 11.6 10*3/uL — ABNORMAL HIGH (ref 4.0–10.5)

## 2016-04-12 MED ORDER — POLYETHYLENE GLYCOL 3350 17 G PO PACK
17.0000 g | PACK | Freq: Every day | ORAL | Status: DC
  Administered 2016-04-12 – 2016-04-13 (×2): 17 g via ORAL
  Filled 2016-04-12 (×2): qty 1

## 2016-04-12 NOTE — Progress Notes (Signed)
[facility] Progress Note  2 Days Post-Op  Subjective: Pt had a lot of gas pains yesterday which have resolved. No abdominal pain today at rest. + Flatus, no nausea, vomiting. Had a fever yesterday no fever or chills overnight. Pt is ambulating. No acute events overnight.   Objective: Vital signs in last 24 hours: Temp:  [98.5 F (36.9 C)-100.4 F (38 C)] 99.5 F (37.5 C) (02/26 0647) Pulse Rate:  [99-105] 104 (02/26 0647) Resp:  [16-18] 16 (02/26 0647) BP: (132-140)/(52-66) 140/66 (02/26 0647) SpO2:  [92 %-95 %] 95 % (02/26 0647) Last BM Date: 04/10/16  Intake/Output from previous day: 02/25 0701 - 02/26 0700 In: 720 [P.O.:720] Out: 50 [Drains:50] Intake/Output this shift: No intake/output data recorded.  PE: Gen:  Alert, NAD, pleasant, cooperative, well appearing Card:  RRR, mild systolic murmur noted Pulm:  CTA, no W/R/R, effort normal Abd: Soft, nondistended, +BS, mild TTP, incisions C/D/I, drain with minimal serosanguinous drainage, mild blanching erythema noted around umbilical incision, no drainage noted. ecchymosis noted around other incisions.  Skin: no rashes noted, warm and dry, not diaphoretic  Lab Results:   Recent Labs  04/11/16 0448 04/12/16 0248  WBC 14.0* 11.6*  HGB 13.6 12.7  HCT 40.1 37.1  PLT 178 160   BMET  Recent Labs  04/11/16 0448 04/12/16 0248  NA 138 138  K 4.1 3.9  CL 102 104  CO2 28 29  GLUCOSE 137* 135*  BUN 7 7  CREATININE 0.92 1.01*  CALCIUM 9.2 8.6*   PT/INR No results for input(s): LABPROT, INR in the last 72 hours. CMP     Component Value Date/Time   NA 138 04/12/2016 0248   K 3.9 04/12/2016 0248   CL 104 04/12/2016 0248   CO2 29 04/12/2016 0248   GLUCOSE 135 (H) 04/12/2016 0248   BUN 7 04/12/2016 0248   CREATININE 1.01 (H) 04/12/2016 0248   CREATININE 0.89 01/13/2016 1113   CALCIUM 8.6 (L) 04/12/2016 0248   PROT 7.6 04/10/2016 1455   ALBUMIN 4.2 04/10/2016 1455   AST 25 04/10/2016 1455   ALT 43  04/10/2016 1455   ALKPHOS 85 04/10/2016 1455   BILITOT 0.9 04/10/2016 1455   GFRNONAA 52 (L) 04/12/2016 0248   GFRNONAA 62 01/13/2016 1113   GFRAA >60 04/12/2016 0248   GFRAA 72 01/13/2016 1113   Lipase     Component Value Date/Time   LIPASE 48 04/10/2016 1455       Studies/Results: Ct Abdomen Pelvis W Contrast  Result Date: 04/10/2016 CLINICAL DATA:  Right lower quadrant abdominal pain EXAM: CT ABDOMEN AND PELVIS WITH CONTRAST TECHNIQUE: Multidetector CT imaging of the abdomen and pelvis was performed using the standard protocol following bolus administration of intravenous contrast. CONTRAST:  100 mL Isovue-300 intravenous COMPARISON:  Ultrasound abdomen 05/27/2014 FINDINGS: Lower chest: Mild scarring and fibrosis in the right middle lobe and subpleural right lower lobe anteriorly. 6 mm pulmonary nodule anterior left lung base. 7 mm pulmonary nodule posterior left lung base. Heart size nonenlarged. Hepatobiliary: Hepatic steatosis. No calcified gallstones. No biliary dilatation. Pancreas: Unremarkable. No pancreatic ductal dilatation or surrounding inflammatory changes. Spleen: Normal in size without focal abnormality. Adrenals/Urinary Tract: Adrenal glands are within normal limits. Kidneys show no focal abnormality. No ureteral stones. Bladder normal. 1.5 cm left posterior perinephric solid nodule. Stomach/Bowel: Stomach nonenlarged.  No dilated small bowel. Inflammatory process in the right lower quadrant. The appendix is enlarged, measuring up to 9 mm. The distal lumen and tip or poorly defined.  Moderate phlegmonous change and inflammation. No extraluminal gas bubbles. Vascular/Lymphatic: Multiple enlarged right lower quadrant lymph nodes. Atherosclerosis of the aorta. Reproductive: Status post hysterectomy. No adnexal masses. Other: No free air.  Trace free fluid in the pelvis. Musculoskeletal: Degenerative changes of the spine. No acute or suspicious bone lesions. IMPRESSION: 1. Findings  consistent with acute appendicitis. Moderate phlegmonous change and inflammatory response in the right lower quadrant around the appendix with poor definition of the distal lumen and tip, cannot exclude micro perforation. No extraluminal gas, free air, or abscess is visualized. 2. Multiple enlarged right lower quadrant lymph nodes, likely reactive. 3. 2 pulmonary nodules in the left lower lobe. No follow-up needed if patient is low-risk (and has no known or suspected primary neoplasm). Non-contrast chest CT can be considered in 12 months if patient is high-risk. This recommendation follows the consensus statement: Guidelines for Management of Incidental Pulmonary Nodules Detected on CT Images: From the Fleischner Society 2017; Radiology 2017; 284:228-243. 4. 1.5 cm solid left posterior periureteral nodule could represent an enlarged lymph node or possible small solid exophytic renal lesion. 5. Hepatic steatosis Electronically Signed   By: Donavan Foil M.D.   On: 04/10/2016 19:30    Anti-infectives: Anti-infectives    Start     Dose/Rate Route Frequency Ordered Stop   04/11/16 0400  piperacillin-tazobactam (ZOSYN) IVPB 3.375 g     3.375 g 12.5 mL/hr over 240 Minutes Intravenous Every 8 hours 04/11/16 0052     04/10/16 2000  piperacillin-tazobactam (ZOSYN) IVPB 3.375 g     3.375 g 100 mL/hr over 30 Minutes Intravenous  Once 04/10/16 1949 04/10/16 2150       Assessment/Plan  Appendicitis S/P appendectomy laparoscopic, 04/10/16, Dr. Ninfa Linden - WBC trending down, 11.6 today down from 14  - Tmax yesterday afternoon 100.4 - tolerating diet, + flatus  Plan: continue abd, continue drain until f/u Thursday, PO Abx for 10 days outpt, encourage ambulation, IS, hopefully discharge today   LOS: 2 days    Kalman Drape , [facility] 04/12/2016, 11:18 AM Pager: [phone] Consults: [phone] Mon-Fri 7:00 am-4:30 pm Sat-Sun 7:00 am-11:30 am

## 2016-04-13 ENCOUNTER — Encounter (HOSPITAL_COMMUNITY): Payer: Self-pay | Admitting: Surgery

## 2016-04-13 MED ORDER — CIPROFLOXACIN HCL 500 MG PO TABS: 500.0000 mg | ORAL_TABLET | Freq: Two times a day (BID) | ORAL | 0 refills | Status: DC

## 2016-04-13 MED ORDER — HYDROCODONE-ACETAMINOPHEN 5-325 MG PO TABS: 1.0000 | ORAL_TABLET | Freq: Four times a day (QID) | ORAL | 0 refills | Status: DC | PRN

## 2016-04-13 MED ORDER — METRONIDAZOLE 500 MG PO TABS: 500.0000 mg | ORAL_TABLET | Freq: Three times a day (TID) | ORAL | 0 refills | Status: DC

## 2016-04-13 NOTE — Progress Notes (Signed)
Discharge instructions reviewed with the patient and family to include activity, medications and follow up appointments.  Printed copies of D/C instructions given to the patient as well as her prescriptions.  Patient discharged with JP drain in place which is to be removed by MD on Thursday.  All voice understanding to teaching. To door via wheelchair.  Home via [place] with her daughter driving.

## 2016-04-13 NOTE — Discharge Instructions (Addendum)
Please arrive at least 30 min before your appointment to complete your check in paperwork.  If you are unable to arrive 30 min prior to your appointment time we may have to cancel or reschedule you.  **please keep a log of how much fluid is coming from your drain every 24 hours  LAPAROSCOPIC SURGERY: POST OP INSTRUCTIONS  1. DIET: Follow a light bland diet the first 24 hours after arrival home, such as soup, liquids, crackers, etc. Be sure to include lots of fluids daily. Avoid fast food or heavy meals as your are more likely to get nauseated. Eat a low fat the next few days after surgery.  2. Take your usually prescribed home medications unless otherwise directed. 3. PAIN CONTROL:  1. Pain is best controlled by a usual combination of three different methods TOGETHER:  1. Ice/Heat 2. Over the counter pain medication 3. Prescription pain medication 2. Most patients will experience some swelling and bruising around the incisions. Ice packs or heating pads (30-60 minutes up to 6 times a day) will help. Use ice for the first few days to help decrease swelling and bruising, then switch to heat to help relax tight/sore spots and speed recovery. Some people prefer to use ice alone, heat alone, alternating between ice & heat. Experiment to what works for you. Swelling and bruising can take several weeks to resolve.  3. It is helpful to take an over-the-counter pain medication regularly for the first few weeks. Choose one of the following that works best for you:  1. Naproxen (Aleve, etc) Two 220mg  tabs twice a day 2. Ibuprofen (Advil, etc) Three 200mg  tabs four times a day (every meal & bedtime) 3. Acetaminophen (Tylenol, etc) 500-650mg  four times a day (every meal & bedtime) 4. A prescription for pain medication (such as oxycodone, hydrocodone, etc) should be given to you upon discharge. Take your pain medication as prescribed.  1. If you are having problems/concerns with the prescription medicine (does  not control pain, nausea, vomiting, rash, itching, etc), please call us [phone] to see if we need to switch you to a different pain medicine that will work better for you and/or control your side effect better. 2. If you need a refill on your pain medication, please contact your pharmacy. They will contact our office to request authorization. Prescriptions will not be filled after 5 pm or on week-ends. 4. Avoid getting constipated. Between the surgery and the pain medications, it is common to experience some constipation. Increasing fluid intake and taking a fiber supplement (such as Metamucil, Citrucel, FiberCon, MiraLax, etc) 1-2 times a day regularly will usually help prevent this problem from occurring. A mild laxative (prune juice, Milk of Magnesia, MiraLax, etc) should be taken according to package directions if there are no bowel movements after 48 hours.  5. Watch out for diarrhea. If you have many loose bowel movements, simplify your diet to bland foods & liquids for a few days. Stop any stool softeners and decrease your fiber supplement. Switching to mild anti-diarrheal medications (Kayopectate, Pepto Bismol) can help. If this worsens or does not improve, please call us. 6. Wash / shower every day. You may shower over the dressings as they are waterproof. Continue to shower over incision(s) after the dressing is off. 7. Remove your waterproof bandages 5 days after surgery. You may leave the incision open to air. You may replace a dressing/Band-Aid to cover the incision for comfort if you wish.  8. ACTIVITIES as tolerated:  1.  NO LIFTING >15lbs for 4 weeks 2. You may resume regular (light) daily activities beginning the next day--such as daily self-care, walking, climbing stairs--gradually increasing activities as tolerated. If you can walk 30 minutes without difficulty, it is safe to try more intense activity such as jogging, treadmill, bicycling, low-impact aerobics, swimming, etc. 3. DO  NOT PUSH THROUGH PAIN. Let pain be your guide: If it hurts to do something, don't do it. Pain is your body warning you to avoid that activity for another week until the pain goes down. 4. You may drive when you are no longer taking prescription pain medication, you can comfortably wear a seatbelt, and you can safely maneuver your car and apply brakes. 5. You may have sexual intercourse when it is comfortable.  9. FOLLOW UP in our office  1. Please call [facility] at [phone] to set up an appointment to see your surgeon in the office for a follow-up appointment approximately 2-3 weeks after your surgery. 2. Make sure that you call for this appointment the day you arrive home to insure a convenient appointment time.      10. IF YOU HAVE DISABILITY OR FAMILY LEAVE FORMS, BRING THEM TO THE               OFFICE FOR PROCESSING.   WHEN TO CALL us [phone]:  1. Poor pain control 2. Reactions / problems with new medications (rash/itching, nausea, etc)  3. Fever over 101.5 F (38.5 C) 4. Inability to urinate 5. Nausea and/or vomiting 6. Worsening swelling or bruising 7. Continued bleeding from incision. 8. Increased pain, redness, or drainage from the incision  The clinic staff is available to answer your questions during regular business hours (8:30am-5pm). Please dont hesitate to call and ask to speak to one of our nurses for clinical concerns.  If you have a medical emergency, go to the nearest emergency room or call 911.  A surgeon from [facility] is always on call at the [facility], [place], [place], [place], [place] [address] ?  MAIN: [phone] ? TOLL FREE: [phone] ?  FAX ([phone]) [phone]  [email or web address]

## 2016-04-13 NOTE — Anesthesia Postprocedure Evaluation (Addendum)
Anesthesia Post Note  Patient: HEBA ARUTYUNYAN  Procedure(s) Performed: Procedure(s) (LRB): APPENDECTOMY LAPAROSCOPIC (N/A)  Patient location during evaluation: PACU Anesthesia Type: General Level of consciousness: awake and alert Pain management: pain level controlled Vital Signs Assessment: post-procedure vital signs reviewed and stable Respiratory status: spontaneous breathing, nonlabored ventilation, respiratory function stable and patient connected to nasal cannula oxygen Cardiovascular status: blood pressure returned to baseline and stable Postop Assessment: no signs of nausea or vomiting Anesthetic complications: no       Last Vitals:  Vitals:   04/13/16 0428 04/13/16 0952  BP: (!) 129/59 136/77  Pulse: 89 94  Resp: 18 18  Temp: 36.9 C 37.4 C    Last Pain:  Vitals:   04/13/16 0952  TempSrc: Oral  PainSc:                  Avedis Bevis

## 2016-04-15 LAB — CULTURE, BLOOD (ROUTINE X 2)
Culture: NO GROWTH
Culture: NO GROWTH

## 2016-04-15 NOTE — Discharge Summary (Signed)
[facility] Discharge Summary   Patient ID: Mackenzie Lewis MRN: [id number] DOB/AGE: Sep 23, 1935 81 y.o.  Admit date: 04/10/2016 Discharge date: 04/12/2016  Admitting Diagnosis: Acute appendicitis  Discharge Diagnosis Patient Active Problem List   Diagnosis Date Noted  . Appendicitis with peritonitis 04/10/2016  . Bilateral primary osteoarthritis of knee 01/20/2016  . Osteoarthritis of both feet 01/20/2016  . Osteoarthritis, hand 01/20/2016  . Cyclic citrullinated peptide (CCP) antibody positive 01/20/2016    Consultants None  Imaging: CT abdomen pelvis w contrast 04/10/16: 1. Findings consistent with acute appendicitis. Moderate phlegmonous change and inflammatory response in the right lower quadrant around the appendix with poor definition of the distal lumen and tip, cannot exclude micro perforation. No extraluminal gas, free air, or abscess is visualized.  2. Multiple enlarged right lower quadrant lymph nodes, likely reactive. 3. 2 pulmonary nodules in the left lower lobe. No follow-up needed if patient is low-risk (and has no known or suspected primary neoplasm). Non-contrast chest CT can be considered in 12 months if patient is high-risk. This recommendation follows the consensus statement: Guidelines for Management of Incidental Pulmonary Nodules Detected on CT Images: From the Fleischner Society 2017; Radiology 2017; 284:228-243. 4. 1.5 cm solid left posterior periureteral nodule could represent an enlarged lymph node or possible small solid exophytic renal lesion. 5. Hepatic steatosis  Procedures Dr. Ninfa Linden (04/10/16) - Laparoscopic Appendectomy  Hospital Course:  Mackenzie Lewis is a 81yo female who presented to [facility]/10/16 with worsening RLQ abdominal pain.  Workup showed acute appendicitis.  Patient was admitted and underwent procedure listed above; a drain was left intraoperatively.  Tolerated procedure well and was transferred to the floor.  Diet was  advanced as tolerated.  On POD2 the patient was voiding well, tolerating diet, ambulating well, pain well controlled, vital signs stable, incisions c/d/i and felt stable for discharge home.  She will follow-up in our office later this week for possible drain removal. She knows to call with questions or concerns.    I have personally reviewed the patients medication history on the [place] controlled substance database.    Physical Exam: Gen:  Alert, NAD, pleasant, cooperative, well appearing Card:  RRR, mild systolic murmur noted Pulm:  CTA, no W/R/R, effort normal Abd: Soft, nondistended, +BS, mild TTP, incisions C/D/I, drain with minimal serosanguinous drainage, mild blanching erythema noted around umbilical incision, no drainage noted. ecchymosis noted around other incisions.  Skin: no rashes noted, warm and dry, not diaphoretic  Allergies as of 04/13/2016      Reactions   Sulfa Antibiotics Rash      Medication List    STOP taking these medications   TRIBENZOR 40-5-12.5 MG Tabs Generic drug:  Olmesartan-Amlodipine-HCTZ     TAKE these medications   aspirin 81 MG EC tablet Take 81 mg by mouth daily. Swallow whole.   ciprofloxacin 500 MG tablet Commonly known as:  CIPRO Take 1 tablet (500 mg total) by mouth 2 (two) times daily. Notes to patient:  Take 12 hours apart   glucosamine-chondroitin 500-400 MG tablet Take 1 tablet by mouth 3 (three) times daily.   HYDROcodone-acetaminophen 5-325 MG tablet Commonly known as:  NORCO/VICODIN Take 1 tablet by mouth every 6 (six) hours as needed for moderate pain.   KRILL OIL PO Take by mouth.   levothyroxine 125 MCG tablet Commonly known as:  SYNTHROID, LEVOTHROID Take 75 mcg by mouth daily before breakfast.   metroNIDAZOLE 500 MG tablet Commonly known as:  FLAGYL Take 1 tablet (500  mg total) by mouth 3 (three) times daily.   PROBIOTIC DAILY PO Take by mouth.   Vitamin D (Ergocalciferol) 50000 units Caps capsule Commonly known as:   DRISDOL Take 50,000 Units by mouth every 7 (seven) days.        Follow-up [facility], [place]. Go on 04/15/2016.   Specialty:  General Surgery Why:  Appointment on 04/15/16 @ 11:00 to have drain looked at/removed. Please arrive 59mins prior for paperwork. Also you have an postop follow up appointment on 05/04/16 @ 11:00.  Contact information: [address] [place] [phone]          Signed: Jerrye Beavers, [facility] 04/15/2016, 3:14 PM Pager: [phone] Consults: [phone] Mon-Fri 7:00 am-4:30 pm Sat-Sun 7:00 am-11:30 am

## 2016-05-03 DIAGNOSIS — J3489 Other specified disorders of nose and nasal sinuses: Secondary | ICD-10-CM | POA: Diagnosis not present

## 2016-05-03 DIAGNOSIS — M270 Developmental disorders of jaws: Secondary | ICD-10-CM | POA: Diagnosis not present

## 2016-05-03 DIAGNOSIS — H9202 Otalgia, left ear: Secondary | ICD-10-CM | POA: Diagnosis not present

## 2016-05-03 DIAGNOSIS — J342 Deviated nasal septum: Secondary | ICD-10-CM | POA: Diagnosis not present

## 2016-05-03 DIAGNOSIS — G4733 Obstructive sleep apnea (adult) (pediatric): Secondary | ICD-10-CM | POA: Diagnosis not present

## 2016-05-03 DIAGNOSIS — Z9989 Dependence on other enabling machines and devices: Secondary | ICD-10-CM | POA: Diagnosis not present

## 2016-05-17 DIAGNOSIS — R945 Abnormal results of liver function studies: Secondary | ICD-10-CM | POA: Diagnosis not present

## 2016-05-18 DIAGNOSIS — R238 Other skin changes: Secondary | ICD-10-CM | POA: Diagnosis not present

## 2016-05-18 DIAGNOSIS — E559 Vitamin D deficiency, unspecified: Secondary | ICD-10-CM | POA: Diagnosis not present

## 2016-05-18 DIAGNOSIS — Z0000 Encounter for general adult medical examination without abnormal findings: Secondary | ICD-10-CM | POA: Diagnosis not present

## 2016-05-18 DIAGNOSIS — E039 Hypothyroidism, unspecified: Secondary | ICD-10-CM | POA: Diagnosis not present

## 2016-05-18 DIAGNOSIS — I129 Hypertensive chronic kidney disease with stage 1 through stage 4 chronic kidney disease, or unspecified chronic kidney disease: Secondary | ICD-10-CM | POA: Diagnosis not present

## 2016-05-18 DIAGNOSIS — N182 Chronic kidney disease, stage 2 (mild): Secondary | ICD-10-CM | POA: Diagnosis not present

## 2016-06-10 DIAGNOSIS — D225 Melanocytic nevi of trunk: Secondary | ICD-10-CM | POA: Diagnosis not present

## 2016-06-10 DIAGNOSIS — L821 Other seborrheic keratosis: Secondary | ICD-10-CM | POA: Diagnosis not present

## 2016-06-10 DIAGNOSIS — L820 Inflamed seborrheic keratosis: Secondary | ICD-10-CM | POA: Diagnosis not present

## 2016-06-10 DIAGNOSIS — D2239 Melanocytic nevi of other parts of face: Secondary | ICD-10-CM | POA: Diagnosis not present

## 2016-06-10 DIAGNOSIS — L578 Other skin changes due to chronic exposure to nonionizing radiation: Secondary | ICD-10-CM | POA: Diagnosis not present

## 2016-07-19 NOTE — Addendum Note (Signed)
Addendum  created 07/19/16 1138 by Oleta Mouse, MD   Sign clinical note

## 2016-07-22 ENCOUNTER — Ambulatory Visit: Payer: Medicare Other | Admitting: Rheumatology

## 2016-08-04 DIAGNOSIS — R079 Chest pain, unspecified: Secondary | ICD-10-CM | POA: Diagnosis not present

## 2016-08-04 DIAGNOSIS — K219 Gastro-esophageal reflux disease without esophagitis: Secondary | ICD-10-CM | POA: Diagnosis not present

## 2016-08-04 DIAGNOSIS — Z1389 Encounter for screening for other disorder: Secondary | ICD-10-CM | POA: Diagnosis not present

## 2016-08-04 DIAGNOSIS — M25512 Pain in left shoulder: Secondary | ICD-10-CM | POA: Diagnosis not present

## 2016-08-04 DIAGNOSIS — R202 Paresthesia of skin: Secondary | ICD-10-CM | POA: Diagnosis not present

## 2016-08-24 DIAGNOSIS — R079 Chest pain, unspecified: Secondary | ICD-10-CM | POA: Diagnosis not present

## 2016-08-24 DIAGNOSIS — K219 Gastro-esophageal reflux disease without esophagitis: Secondary | ICD-10-CM | POA: Diagnosis not present

## 2016-09-01 DIAGNOSIS — E785 Hyperlipidemia, unspecified: Secondary | ICD-10-CM

## 2016-09-01 DIAGNOSIS — E039 Hypothyroidism, unspecified: Secondary | ICD-10-CM

## 2016-09-01 DIAGNOSIS — N182 Chronic kidney disease, stage 2 (mild): Secondary | ICD-10-CM

## 2016-09-01 DIAGNOSIS — I129 Hypertensive chronic kidney disease with stage 1 through stage 4 chronic kidney disease, or unspecified chronic kidney disease: Secondary | ICD-10-CM

## 2016-09-01 HISTORY — DX: Hypothyroidism, unspecified: E03.9

## 2016-09-01 HISTORY — DX: Hypertensive chronic kidney disease with stage 1 through stage 4 chronic kidney disease, or unspecified chronic kidney disease: I12.9

## 2016-09-01 HISTORY — DX: Hyperlipidemia, unspecified: E78.5

## 2016-09-01 HISTORY — DX: Chronic kidney disease, stage 2 (mild): N18.2

## 2016-09-06 ENCOUNTER — Other Ambulatory Visit: Payer: Self-pay

## 2016-09-09 ENCOUNTER — Encounter: Payer: Self-pay | Admitting: Cardiology

## 2016-09-09 ENCOUNTER — Ambulatory Visit (INDEPENDENT_AMBULATORY_CARE_PROVIDER_SITE_OTHER): Payer: Medicare Other | Admitting: Cardiology

## 2016-09-09 VITALS — BP 146/78 | HR 100 | Resp 10 | Ht 60.0 in | Wt 216.8 lb

## 2016-09-09 DIAGNOSIS — R7303 Prediabetes: Secondary | ICD-10-CM | POA: Diagnosis not present

## 2016-09-09 DIAGNOSIS — I10 Essential (primary) hypertension: Secondary | ICD-10-CM

## 2016-09-09 DIAGNOSIS — R0789 Other chest pain: Secondary | ICD-10-CM | POA: Diagnosis not present

## 2016-09-09 DIAGNOSIS — E782 Mixed hyperlipidemia: Secondary | ICD-10-CM

## 2016-09-09 HISTORY — DX: Essential (primary) hypertension: I10

## 2016-09-09 HISTORY — DX: Prediabetes: R73.03

## 2016-09-09 HISTORY — DX: Other chest pain: R07.89

## 2016-09-09 NOTE — Patient Instructions (Signed)
Medication Instructions:  Your physician recommends that you continue on your current medications as directed. Please refer to the Current Medication list given to you today.   Labwork: None  Testing/Procedures: Your physician has requested that you have an exercise tolerance test. For further information please visit [email or web address]. Please also follow instruction sheet, as given.  Your physician has requested that you have an echocardiogram. Echocardiography is a painless test that uses sound waves to create images of your heart. It provides your doctor with information about the size and shape of your heart and how well your heart's chambers and valves are working. This procedure takes approximately one hour. There are no restrictions for this procedure.    Follow-Up: Your physician recommends that you schedule a follow-up appointment in: 3-4 weeks.   Any Other Special Instructions Will Be Listed Below (If Applicable).     If you need a refill on your cardiac medications before your next appointment, please call your pharmacy.

## 2016-09-09 NOTE — Progress Notes (Signed)
Cardiology Consultation:    Date:  09/09/2016   ID:  Mackenzie Lewis, DOB [birth year], MRN [id number]  PCP:  Glendale Chard, MD  Cardiologist:  Jenne Campus, MD   Referring MD: Glendale Chard, MD   Chief Complaint  Patient presents with  . Chest Pain    Tightness in the chest  I'm here to be evaluated for chest pain  History of Present Illness:    Mackenzie Lewis is a 81 y.o. female who is being seen today for the evaluation of Chest pain at the request of Glendale Chard, MD. Patient is a 48 is a woman with hypertension, borderline diabetes, poorly controlled dyslipidemia, presented to [place] with chief complaint of chest pain. For about 6 months she's been experiencing this uncomfortable sensation in the chest that is not related to exercise. She described this as heaviness in the chest. That sensation can last for a few hours. It is not provoked by exercise, it is not relieved by rest, she does not have any sweating with the sensation but sometimes does have some shortness of breath. Also described to have some exertional shortness of breath. She is not physically active. She was scheduled to have gastroscopy however was referred to [place] for evaluation for potential heart problem which was very appropriate. She was referred to gastroenterologist for gastroscopy however symptoms were suspicious and she was very appropriately referred to [place] for evaluation of her heart.  Past Medical History:  Diagnosis Date  . Hypertension   . Thyroid disease     Past Surgical History:  Procedure Laterality Date  . LAPAROSCOPIC APPENDECTOMY N/A 04/10/2016   Procedure: APPENDECTOMY LAPAROSCOPIC;  Surgeon: Coralie Keens, MD;  Location: [facility];  Service: General;  Laterality: N/A;    Current Medications: Current Meds  Medication Sig  . aspirin 81 MG EC tablet Take 81 mg by mouth daily. Swallow whole.  . Cholecalciferol (VITAMIN D3) 5000 units CAPS Take 1 capsule by mouth daily.  Marland Kitchen KRILL OIL PO Take 1  capsule by mouth daily.   Marland Kitchen levothyroxine (SYNTHROID, LEVOTHROID) 150 MCG tablet Take 75 mcg by mouth daily before breakfast.   . Olmesartan-Amlodipine-HCTZ (TRIBENZOR) 40-5-12.5 MG TABS Take 1 tablet by mouth daily.  . pantoprazole (PROTONIX) 40 MG tablet Take 40 mg by mouth daily.  . Probiotic Product (PROBIOTIC DAILY PO) Take by mouth.     Allergies:   Sulfa antibiotics   Social History   Social History  . Marital status: Divorced    Spouse name: N/A  . Number of children: N/A  . Years of education: N/A   Social History Main Topics  . Smoking status: Never Smoker  . Smokeless tobacco: Never Used  . Alcohol use No  . Drug use: No  . Sexual activity: Not Asked   Other Topics Concern  . None   Social History Narrative  . None     Family History: The patient's family history includes AAA (abdominal aortic aneurysm) in her brother; Hypertension in her mother; Prostate cancer in her father. ROS:   Please see the history of present illness.    All 14 point review of systems negative except as described per history of present illness.  EKGs/Labs/Other Studies Reviewed:    The following studies were reviewed today: EKG showed normal sinus rhythm, normal. Interval, normal., Nonspecific ST-T segment changes.   Recent Labs: 04/10/2016: ALT 43 04/12/2016: BUN 7; Creatinine, Ser 1.01; Hemoglobin 12.7; Platelets 160; Potassium 3.9; Sodium 138  Recent Lipid Panel No  results found for: CHOL, TRIG, HDL, CHOLHDL, VLDL, LDLCALC, LDLDIRECT  Physical Exam:    VS:  BP (!) 146/78   Pulse 100   Resp 10   Ht 5' (1.524 m)   Wt 216 lb 12.8 oz (98.3 kg)   BMI 42.34 kg/m     Wt Readings from Last 3 Encounters:  09/09/16 216 lb 12.8 oz (98.3 kg)  04/10/16 213 lb (96.6 kg)  01/22/16 213 lb (96.6 kg)     GEN:  Well nourished, well developed in no acute distress HEENT: Normal NECK: No JVD; No carotid bruits LYMPHATICS: No lymphadenopathy CARDIAC: RRR,There is a soft ejection  murmur best heard at the right upper portion of the sternum grade 1-2/6, no, no rubs, no gallops RESPIRATORY:  Clear to auscultation without rales, wheezing or rhonchi  ABDOMEN: Soft, non-tender, non-distended MUSCULOSKELETAL:  No edema; No deformity  SKIN: Warm and dry NEUROLOGIC:  Alert and oriented x 3 PSYCHIATRIC:  Normal affect   ASSESSMENT:    1. Mixed hyperlipidemia   2. Atypical chest pain   3. Essential hypertension   4. Borderline diabetes    PLAN:    In order of problems listed above:  1. Atypical chest pain in this woman with multiple risk factors for coronary artery disease. We had a long discussion about what to do with this situation. I advised her to continue with aspirin. I advised her to avoid exercise until we have a stress test done. I will schedule her to have nuclear stress test. 2. Essential hypertension. His first visit in our office will keep eye on this I expect that needs to intensify her medication to get better control her blood pressure. 3. Dyslipidemia: She does have minimal elevation of liver function tests which is probably related to fatty reliever. Started talking to her about cholesterol on medication she's reluctant I think we can wait for results of stress test to decide about aggressiveness of therapy. 4. Borderline diabetes: We talked about exercises on the radial basis and this is something we can revisit again when I see him one more time. I told her not to push herself up to will get stress test). 5. Systolic murmur I suspect aortic stenosis but I doubt very much that this is critical and significant however echocardiogram to be done to confirm the diagnosis and check severity.   Medication Adjustments/Labs and Tests Ordered: Current medicines are reviewed at length with the patient today.  Concerns regarding medicines are outlined above.  No orders of the defined types were placed in this encounter.  No orders of the defined types were placed  in this encounter.   Signed, Park Liter, MD, [facility]. 09/09/2016 9:29 AM    [place]

## 2016-09-15 DIAGNOSIS — R0789 Other chest pain: Secondary | ICD-10-CM | POA: Diagnosis not present

## 2016-09-21 ENCOUNTER — Telehealth: Payer: Self-pay

## 2016-09-21 NOTE — Telephone Encounter (Signed)
Pt advised of results. 

## 2016-10-07 ENCOUNTER — Ambulatory Visit: Payer: Medicare Other | Admitting: Cardiology

## 2016-10-14 ENCOUNTER — Ambulatory Visit (INDEPENDENT_AMBULATORY_CARE_PROVIDER_SITE_OTHER): Payer: Medicare Other | Admitting: Cardiology

## 2016-10-14 ENCOUNTER — Encounter: Payer: Self-pay | Admitting: Cardiology

## 2016-10-14 VITALS — BP 138/70 | HR 88 | Resp 10 | Ht 60.0 in | Wt 217.8 lb

## 2016-10-14 DIAGNOSIS — R0789 Other chest pain: Secondary | ICD-10-CM | POA: Diagnosis not present

## 2016-10-14 DIAGNOSIS — I10 Essential (primary) hypertension: Secondary | ICD-10-CM | POA: Diagnosis not present

## 2016-10-14 DIAGNOSIS — E782 Mixed hyperlipidemia: Secondary | ICD-10-CM | POA: Diagnosis not present

## 2016-10-14 DIAGNOSIS — R7303 Prediabetes: Secondary | ICD-10-CM | POA: Diagnosis not present

## 2016-10-14 NOTE — Patient Instructions (Addendum)
Medication Instructions:  Your physician recommends that you continue on your current medications as directed. Please refer to the Current Medication list given to you today.  Labwork: None  Testing/Procedures: None   Follow-Up: Your physician recommends that you schedule a follow-up appointment in: 4 months   Dr. Chrys Racer C. Prochnau, MD [address] [place], [place] [address] [phone]  Any Other Special Instructions Will Be Listed Below (If Applicable).  Please note that any paperwork needing to be filled out by the provider will need to be addressed at the front desk prior to seeing the provider. Please note that any paperwork FMLA, Disability or other documents regarding health condition is subject to a $25.00 charge that must be received prior to completion of paperwork in the form of a money order or check.    If you need a refill on your cardiac medications before your next appointment, please call your pharmacy.

## 2016-10-14 NOTE — Progress Notes (Signed)
Cardiology Office Note:    Date:  10/14/2016   ID:  Mackenzie Lewis, DOB 16-Dec-1935, MRN [id number]  PCP:  Glendale Chard, MD  Cardiologist:  Jenne Campus, MD    Referring MD: Glendale Chard, MD   Chief Complaint  Patient presents with  . 3-4 week follow up  Doing better.  History of Present Illness:    Mackenzie Lewis is a 81 y.o. female  with borderline diabetes, hypertension, dyslipidemia, she complained of atypical chest pain. She did have echocardiogram done which showed preserved left ventricular ejection fraction, she also had stress test done which showed no evidence of ischemia. Loss was very low. However, she is already determined that she would like to start exercising on the regular basis. She sign-up with gym she got new shoes and ready to exercise. We spent cradle of time talking about that. I told her I will not initiate any medications for now seems she decided to lose weight and change her diet however I would like to see her back in about 3 months to revisit status issues.  Past Medical History:  Diagnosis Date  . Hypertension   . Thyroid disease     Past Surgical History:  Procedure Laterality Date  . LAPAROSCOPIC APPENDECTOMY N/A 04/10/2016   Procedure: APPENDECTOMY LAPAROSCOPIC;  Surgeon: Coralie Keens, MD;  Location: [facility];  Service: General;  Laterality: N/A;    Current Medications: Current Meds  Medication Sig  . aspirin 81 MG EC tablet Take 81 mg by mouth daily. Swallow whole.  . Cholecalciferol (VITAMIN D3) 5000 units CAPS Take 1 capsule by mouth daily.  Marland Kitchen KRILL OIL PO Take 1 capsule by mouth daily.   Marland Kitchen levothyroxine (SYNTHROID, LEVOTHROID) 150 MCG tablet Take 75 mcg by mouth daily before breakfast.   . Olmesartan-Amlodipine-HCTZ (TRIBENZOR) 40-5-12.5 MG TABS Take 1 tablet by mouth daily.  . pantoprazole (PROTONIX) 40 MG tablet Take 40 mg by mouth daily.  . Probiotic Product (PROBIOTIC DAILY PO) Take by mouth.     Allergies:   Sulfa antibiotics    Social History   Social History  . Marital status: Divorced    Spouse name: N/A  . Number of children: N/A  . Years of education: N/A   Social History Main Topics  . Smoking status: Never Smoker  . Smokeless tobacco: Never Used  . Alcohol use No  . Drug use: No  . Sexual activity: Not Asked   Other Topics Concern  . None   Social History Narrative  . None     Family History: The patient's family history includes AAA (abdominal aortic aneurysm) in her brother; Hypertension in her mother; Prostate cancer in her father. ROS:   Please see the history of present illness.    All 14 point review of systems negative except as described per history of present illness  EKGs/Labs/Other Studies Reviewed:      Recent Labs: 04/10/2016: ALT 43 04/12/2016: BUN 7; Creatinine, Ser 1.01; Hemoglobin 12.7; Platelets 160; Potassium 3.9; Sodium 138  Recent Lipid Panel No results found for: CHOL, TRIG, HDL, CHOLHDL, VLDL, LDLCALC, LDLDIRECT  Physical Exam:    VS:  BP 138/70   Pulse 88   Resp 10   Ht 5' (1.524 m)   Wt 217 lb 12.8 oz (98.8 kg)   BMI 42.54 kg/m     Wt Readings from Last 3 Encounters:  10/14/16 217 lb 12.8 oz (98.8 kg)  09/09/16 216 lb 12.8 oz (98.3 kg)  04/10/16 213 lb (96.6  kg)     GEN:  Well nourished, well developed in no acute distress HEENT: Normal NECK: No JVD; No carotid bruits LYMPHATICS: No lymphadenopathy CARDIAC: RRR, no murmurs, no rubs, no gallops RESPIRATORY:  Clear to auscultation without rales, wheezing or rhonchi  ABDOMEN: Soft, non-tender, non-distended MUSCULOSKELETAL:  No edema; No deformity  SKIN: Warm and dry LOWER EXTREMITIES: no swelling NEUROLOGIC:  Alert and oriented x 3 PSYCHIATRIC:  Normal affect   ASSESSMENT:    1. Essential hypertension   2. Mixed hyperlipidemia   3. Atypical chest pain   4. Borderline diabetes    PLAN:    In order of problems listed above:  1. Essential hypertension: Blood pressure was slightly  elevated today but again she is planning to exercise on the radial basis as well as change her diet which can lead to improvement in blood pressure. 2. Dyslipidemia: Not enough to start treating it but again we can watch her carefully and hopefully she will be able to correct this with exercises and diet. 3. Chest pain: Denies having any, stress test negative for exercise-induced myocardial ischemia. 4. Borderline diabetes I'm convinced that losing weight and exercises on the radial basis help prevent her from developing diabetes.   Medication Adjustments/Labs and Tests Ordered: Current medicines are reviewed at length with the patient today.  Concerns regarding medicines are outlined above.  No orders of the defined types were placed in this encounter.  Medication changes: No orders of the defined types were placed in this encounter.   Signed, Park Liter, MD, [facility] 10/14/2016 12:15 PM    [place]

## 2016-11-02 DIAGNOSIS — R079 Chest pain, unspecified: Secondary | ICD-10-CM | POA: Diagnosis not present

## 2016-11-02 DIAGNOSIS — K219 Gastro-esophageal reflux disease without esophagitis: Secondary | ICD-10-CM | POA: Diagnosis not present

## 2016-11-02 DIAGNOSIS — R140 Abdominal distension (gaseous): Secondary | ICD-10-CM | POA: Diagnosis not present

## 2016-11-02 DIAGNOSIS — R1013 Epigastric pain: Secondary | ICD-10-CM | POA: Diagnosis not present

## 2016-11-26 DIAGNOSIS — K219 Gastro-esophageal reflux disease without esophagitis: Secondary | ICD-10-CM | POA: Diagnosis not present

## 2016-11-26 DIAGNOSIS — K2970 Gastritis, unspecified, without bleeding: Secondary | ICD-10-CM | POA: Diagnosis not present

## 2016-11-26 DIAGNOSIS — R079 Chest pain, unspecified: Secondary | ICD-10-CM | POA: Diagnosis not present

## 2017-01-19 DIAGNOSIS — H02834 Dermatochalasis of left upper eyelid: Secondary | ICD-10-CM | POA: Diagnosis not present

## 2017-01-19 DIAGNOSIS — H02831 Dermatochalasis of right upper eyelid: Secondary | ICD-10-CM | POA: Diagnosis not present

## 2017-01-19 DIAGNOSIS — H40003 Preglaucoma, unspecified, bilateral: Secondary | ICD-10-CM | POA: Diagnosis not present

## 2017-01-19 DIAGNOSIS — H25813 Combined forms of age-related cataract, bilateral: Secondary | ICD-10-CM | POA: Diagnosis not present

## 2017-01-20 DIAGNOSIS — R748 Abnormal levels of other serum enzymes: Secondary | ICD-10-CM | POA: Diagnosis not present

## 2017-01-20 DIAGNOSIS — E559 Vitamin D deficiency, unspecified: Secondary | ICD-10-CM | POA: Diagnosis not present

## 2017-01-20 DIAGNOSIS — M2550 Pain in unspecified joint: Secondary | ICD-10-CM | POA: Diagnosis not present

## 2017-01-20 DIAGNOSIS — Z79899 Other long term (current) drug therapy: Secondary | ICD-10-CM | POA: Diagnosis not present

## 2017-01-20 DIAGNOSIS — R5383 Other fatigue: Secondary | ICD-10-CM | POA: Diagnosis not present

## 2017-01-20 DIAGNOSIS — K2970 Gastritis, unspecified, without bleeding: Secondary | ICD-10-CM | POA: Diagnosis not present

## 2017-01-20 DIAGNOSIS — R0982 Postnasal drip: Secondary | ICD-10-CM | POA: Diagnosis not present

## 2017-01-20 DIAGNOSIS — E038 Other specified hypothyroidism: Secondary | ICD-10-CM | POA: Diagnosis not present

## 2017-01-20 DIAGNOSIS — I10 Essential (primary) hypertension: Secondary | ICD-10-CM | POA: Diagnosis not present

## 2017-01-20 DIAGNOSIS — E063 Autoimmune thyroiditis: Secondary | ICD-10-CM | POA: Diagnosis not present

## 2017-01-20 DIAGNOSIS — R110 Nausea: Secondary | ICD-10-CM | POA: Diagnosis not present

## 2017-01-20 DIAGNOSIS — G4733 Obstructive sleep apnea (adult) (pediatric): Secondary | ICD-10-CM | POA: Diagnosis not present

## 2017-01-20 DIAGNOSIS — E785 Hyperlipidemia, unspecified: Secondary | ICD-10-CM | POA: Diagnosis not present

## 2017-02-03 ENCOUNTER — Encounter: Payer: Self-pay | Admitting: Cardiology

## 2017-02-03 ENCOUNTER — Ambulatory Visit (INDEPENDENT_AMBULATORY_CARE_PROVIDER_SITE_OTHER): Payer: Medicare Other | Admitting: Cardiology

## 2017-02-03 VITALS — BP 134/66 | HR 93 | Ht 60.0 in | Wt 220.0 lb

## 2017-02-03 DIAGNOSIS — R0789 Other chest pain: Secondary | ICD-10-CM

## 2017-02-03 DIAGNOSIS — E782 Mixed hyperlipidemia: Secondary | ICD-10-CM | POA: Diagnosis not present

## 2017-02-03 DIAGNOSIS — R7303 Prediabetes: Secondary | ICD-10-CM

## 2017-02-03 NOTE — Patient Instructions (Signed)
Medication Instructions:  Your physician recommends that you continue on your current medications as directed. Please refer to the Current Medication list given to you today.  Labwork: None ordered  Testing/Procedures: None ordered  Follow-Up: Your physician recommends that you schedule a follow-up appointment in: 4 months with Dr. Krasowski   Any Other Special Instructions Will Be Listed Below (If Applicable).     If you need a refill on your cardiac medications before your next appointment, please call your pharmacy.   

## 2017-02-03 NOTE — Progress Notes (Signed)
Cardiology Office Note:    Date:  02/03/2017   ID:  Mackenzie Lewis, DOB [birth year], MRN [id number]  PCP:  Glendale Chard, MD  Cardiologist:  Jenne Campus, MD    Referring MD: Glendale Chard, MD   Chief Complaint  Patient presents with  . Follow-up  Doing fine  History of Present Illness:    Mackenzie Lewis is a 81 y.o. female with atypical chest pain.  So far workup has been negative and we concentrating on modification of her risk factors for coronary artery disease.  Last time I seen her she decided to sign up with the gym she brought her new comfortable shoes for walking however now she tells me she does not have time to do it in the medevac she 3 pounds since I seen her last time again we spent a lot of time talking about healthy lifestyle good diet exercises on the regular basis she promised to do it.  She visited her primary care physician who did her cholesterol will wait for results of it.  Past Medical History:  Diagnosis Date  . Hypertension   . Thyroid disease     Past Surgical History:  Procedure Laterality Date  . LAPAROSCOPIC APPENDECTOMY N/A 04/10/2016   Procedure: APPENDECTOMY LAPAROSCOPIC;  Surgeon: Coralie Keens, MD;  Location: [facility];  Service: General;  Laterality: N/A;    Current Medications: Current Meds  Medication Sig  . aspirin 81 MG EC tablet Take 81 mg by mouth daily. Swallow whole.  . Cholecalciferol (VITAMIN D3) 5000 units CAPS Take 1 capsule by mouth daily.  Marland Kitchen KRILL OIL PO Take 1 capsule by mouth daily.   Marland Kitchen levothyroxine (SYNTHROID, LEVOTHROID) 150 MCG tablet Take 75 mcg by mouth daily before breakfast.   . Olmesartan-Amlodipine-HCTZ (TRIBENZOR) 40-5-12.5 MG TABS Take 1 tablet by mouth daily.  . pantoprazole (PROTONIX) 40 MG tablet Take 40 mg by mouth daily.  . Probiotic Product (PROBIOTIC DAILY PO) Take by mouth.     Allergies:   Sulfa antibiotics   Social History   Socioeconomic History  . Marital status: Divorced    Spouse name:  None  . Number of children: None  . Years of education: None  . Highest education level: None  Social Needs  . Financial resource strain: None  . Food insecurity - worry: None  . Food insecurity - inability: None  . Transportation needs - medical: None  . Transportation needs - non-medical: None  Occupational History  . None  Tobacco Use  . Smoking status: Never Smoker  . Smokeless tobacco: Never Used  Substance and Sexual Activity  . Alcohol use: No    Alcohol/week: 0.0 oz  . Drug use: No  . Sexual activity: None  Other Topics Concern  . None  Social History Narrative  . None     Family History: The patient's family history includes AAA (abdominal aortic aneurysm) in her brother; Hypertension in her mother; Prostate cancer in her father. ROS:   Please see the history of present illness.    All 14 point review of systems negative except as described per history of present illness  EKGs/Labs/Other Studies Reviewed:      Recent Labs: 04/10/2016: ALT 43 04/12/2016: BUN 7; Creatinine, Ser 1.01; Hemoglobin 12.7; Platelets 160; Potassium 3.9; Sodium 138  Recent Lipid Panel No results found for: CHOL, TRIG, HDL, CHOLHDL, VLDL, LDLCALC, LDLDIRECT  Physical Exam:    VS:  BP 134/66   Pulse 93   Ht 5' (1.524 m)  Wt 220 lb (99.8 kg)   SpO2 96%   BMI 42.97 kg/m     Wt Readings from Last 3 Encounters:  02/03/17 220 lb (99.8 kg)  10/14/16 217 lb 12.8 oz (98.8 kg)  09/09/16 216 lb 12.8 oz (98.3 kg)     GEN:  Well nourished, well developed in no acute distress HEENT: Normal NECK: No JVD; No carotid bruits LYMPHATICS: No lymphadenopathy CARDIAC: RRR, no murmurs, no rubs, no gallops RESPIRATORY:  Clear to auscultation without rales, wheezing or rhonchi  ABDOMEN: Soft, non-tender, non-distended MUSCULOSKELETAL:  No edema; No deformity  SKIN: Warm and dry LOWER EXTREMITIES: no swelling NEUROLOGIC:  Alert and oriented x 3 PSYCHIATRIC:  Normal affect   ASSESSMENT:     1. Atypical chest pain   2. Borderline diabetes   3. Mixed hyperlipidemia    PLAN:    In order of problems listed above:  1. Essential hypertension: Blood pressure well controlled continue present management. 2. Typical chest pain: Stress test negative.  Denies having any symptoms. 3. Diabetes will get results from her primary care physician.  Again issue of exercise been discussed in length and she promised to try to do it.   Medication Adjustments/Labs and Tests Ordered: Current medicines are reviewed at length with the patient today.  Concerns regarding medicines are outlined above.  No orders of the defined types were placed in this encounter.  Medication changes: No orders of the defined types were placed in this encounter.   Signed, Park Liter, MD, [facility] 02/03/2017 10:48 AM    [place]

## 2017-03-02 DIAGNOSIS — R110 Nausea: Secondary | ICD-10-CM | POA: Diagnosis not present

## 2017-03-02 DIAGNOSIS — I10 Essential (primary) hypertension: Secondary | ICD-10-CM | POA: Diagnosis not present

## 2017-03-02 DIAGNOSIS — R1011 Right upper quadrant pain: Secondary | ICD-10-CM | POA: Diagnosis not present

## 2017-03-02 DIAGNOSIS — I8393 Asymptomatic varicose veins of bilateral lower extremities: Secondary | ICD-10-CM | POA: Diagnosis not present

## 2017-03-02 DIAGNOSIS — E063 Autoimmune thyroiditis: Secondary | ICD-10-CM | POA: Diagnosis not present

## 2017-03-02 DIAGNOSIS — L299 Pruritus, unspecified: Secondary | ICD-10-CM | POA: Diagnosis not present

## 2017-03-09 DIAGNOSIS — R1011 Right upper quadrant pain: Secondary | ICD-10-CM | POA: Diagnosis not present

## 2017-03-09 DIAGNOSIS — R110 Nausea: Secondary | ICD-10-CM | POA: Diagnosis not present

## 2017-03-17 DIAGNOSIS — I8311 Varicose veins of right lower extremity with inflammation: Secondary | ICD-10-CM | POA: Diagnosis not present

## 2017-03-17 DIAGNOSIS — I8312 Varicose veins of left lower extremity with inflammation: Secondary | ICD-10-CM | POA: Diagnosis not present

## 2017-03-17 DIAGNOSIS — I83813 Varicose veins of bilateral lower extremities with pain: Secondary | ICD-10-CM | POA: Diagnosis not present

## 2017-03-22 DIAGNOSIS — I8311 Varicose veins of right lower extremity with inflammation: Secondary | ICD-10-CM | POA: Diagnosis not present

## 2017-03-22 DIAGNOSIS — I8312 Varicose veins of left lower extremity with inflammation: Secondary | ICD-10-CM | POA: Diagnosis not present

## 2017-03-24 DIAGNOSIS — R1010 Upper abdominal pain, unspecified: Secondary | ICD-10-CM | POA: Diagnosis not present

## 2017-03-24 DIAGNOSIS — R110 Nausea: Secondary | ICD-10-CM | POA: Diagnosis not present

## 2017-03-24 DIAGNOSIS — R1011 Right upper quadrant pain: Secondary | ICD-10-CM | POA: Diagnosis not present

## 2017-04-05 DIAGNOSIS — I83813 Varicose veins of bilateral lower extremities with pain: Secondary | ICD-10-CM | POA: Diagnosis not present

## 2017-04-05 DIAGNOSIS — I8312 Varicose veins of left lower extremity with inflammation: Secondary | ICD-10-CM | POA: Diagnosis not present

## 2017-04-05 DIAGNOSIS — I8311 Varicose veins of right lower extremity with inflammation: Secondary | ICD-10-CM | POA: Diagnosis not present

## 2017-04-13 DIAGNOSIS — E063 Autoimmune thyroiditis: Secondary | ICD-10-CM | POA: Diagnosis not present

## 2017-04-13 DIAGNOSIS — E785 Hyperlipidemia, unspecified: Secondary | ICD-10-CM | POA: Diagnosis not present

## 2017-04-13 DIAGNOSIS — E038 Other specified hypothyroidism: Secondary | ICD-10-CM | POA: Diagnosis not present

## 2017-04-13 DIAGNOSIS — Z1231 Encounter for screening mammogram for malignant neoplasm of breast: Secondary | ICD-10-CM | POA: Diagnosis not present

## 2017-04-13 DIAGNOSIS — G4733 Obstructive sleep apnea (adult) (pediatric): Secondary | ICD-10-CM | POA: Diagnosis not present

## 2017-04-13 DIAGNOSIS — I10 Essential (primary) hypertension: Secondary | ICD-10-CM | POA: Diagnosis not present

## 2017-04-13 DIAGNOSIS — K219 Gastro-esophageal reflux disease without esophagitis: Secondary | ICD-10-CM | POA: Diagnosis not present

## 2017-04-13 DIAGNOSIS — Z6837 Body mass index (BMI) 37.0-37.9, adult: Secondary | ICD-10-CM | POA: Diagnosis not present

## 2017-04-21 DIAGNOSIS — E063 Autoimmune thyroiditis: Secondary | ICD-10-CM | POA: Diagnosis not present

## 2017-04-21 DIAGNOSIS — E785 Hyperlipidemia, unspecified: Secondary | ICD-10-CM | POA: Diagnosis not present

## 2017-04-21 DIAGNOSIS — E038 Other specified hypothyroidism: Secondary | ICD-10-CM | POA: Diagnosis not present

## 2017-04-21 DIAGNOSIS — R7309 Other abnormal glucose: Secondary | ICD-10-CM | POA: Diagnosis not present

## 2017-04-21 DIAGNOSIS — R748 Abnormal levels of other serum enzymes: Secondary | ICD-10-CM | POA: Diagnosis not present

## 2017-04-21 DIAGNOSIS — I10 Essential (primary) hypertension: Secondary | ICD-10-CM | POA: Diagnosis not present

## 2017-04-21 DIAGNOSIS — Z79899 Other long term (current) drug therapy: Secondary | ICD-10-CM | POA: Diagnosis not present

## 2017-04-22 DIAGNOSIS — I8312 Varicose veins of left lower extremity with inflammation: Secondary | ICD-10-CM | POA: Diagnosis not present

## 2017-04-22 DIAGNOSIS — I83812 Varicose veins of left lower extremities with pain: Secondary | ICD-10-CM | POA: Diagnosis not present

## 2017-04-27 DIAGNOSIS — I8312 Varicose veins of left lower extremity with inflammation: Secondary | ICD-10-CM | POA: Diagnosis not present

## 2017-05-03 DIAGNOSIS — Z1231 Encounter for screening mammogram for malignant neoplasm of breast: Secondary | ICD-10-CM | POA: Diagnosis not present

## 2017-05-18 DIAGNOSIS — N6041 Mammary duct ectasia of right breast: Secondary | ICD-10-CM | POA: Diagnosis not present

## 2017-05-18 DIAGNOSIS — R928 Other abnormal and inconclusive findings on diagnostic imaging of breast: Secondary | ICD-10-CM | POA: Diagnosis not present

## 2017-05-18 DIAGNOSIS — N6001 Solitary cyst of right breast: Secondary | ICD-10-CM | POA: Diagnosis not present

## 2017-05-20 DIAGNOSIS — I8312 Varicose veins of left lower extremity with inflammation: Secondary | ICD-10-CM | POA: Diagnosis not present

## 2017-05-20 DIAGNOSIS — I83812 Varicose veins of left lower extremities with pain: Secondary | ICD-10-CM | POA: Diagnosis not present

## 2017-05-23 DIAGNOSIS — I8312 Varicose veins of left lower extremity with inflammation: Secondary | ICD-10-CM | POA: Diagnosis not present

## 2017-06-10 ENCOUNTER — Encounter (HOSPITAL_COMMUNITY): Payer: Self-pay | Admitting: *Deleted

## 2017-06-10 ENCOUNTER — Emergency Department (HOSPITAL_COMMUNITY)
Admission: EM | Admit: 2017-06-10 | Discharge: 2017-06-10 | Disposition: A | Discharge status: Home / Self Care | Payer: Medicare Other | Attending: Emergency Medicine | Admitting: Emergency Medicine

## 2017-06-10 ENCOUNTER — Emergency Department (HOSPITAL_COMMUNITY): Payer: Medicare Other

## 2017-06-10 ENCOUNTER — Other Ambulatory Visit: Payer: Self-pay

## 2017-06-10 DIAGNOSIS — Y999 Unspecified external cause status: Secondary | ICD-10-CM | POA: Insufficient documentation

## 2017-06-10 DIAGNOSIS — Z79899 Other long term (current) drug therapy: Secondary | ICD-10-CM | POA: Insufficient documentation

## 2017-06-10 DIAGNOSIS — N182 Chronic kidney disease, stage 2 (mild): Secondary | ICD-10-CM | POA: Insufficient documentation

## 2017-06-10 DIAGNOSIS — I129 Hypertensive chronic kidney disease with stage 1 through stage 4 chronic kidney disease, or unspecified chronic kidney disease: Secondary | ICD-10-CM | POA: Diagnosis not present

## 2017-06-10 DIAGNOSIS — Z7982 Long term (current) use of aspirin: Secondary | ICD-10-CM | POA: Insufficient documentation

## 2017-06-10 DIAGNOSIS — S92511A Displaced fracture of proximal phalanx of right lesser toe(s), initial encounter for closed fracture: Secondary | ICD-10-CM | POA: Insufficient documentation

## 2017-06-10 DIAGNOSIS — Y9301 Activity, walking, marching and hiking: Secondary | ICD-10-CM | POA: Insufficient documentation

## 2017-06-10 DIAGNOSIS — Y929 Unspecified place or not applicable: Secondary | ICD-10-CM | POA: Diagnosis not present

## 2017-06-10 DIAGNOSIS — S99921A Unspecified injury of right foot, initial encounter: Secondary | ICD-10-CM | POA: Diagnosis present

## 2017-06-10 DIAGNOSIS — W2203XA Walked into furniture, initial encounter: Secondary | ICD-10-CM | POA: Diagnosis not present

## 2017-06-10 DIAGNOSIS — S92514A Nondisplaced fracture of proximal phalanx of right lesser toe(s), initial encounter for closed fracture: Secondary | ICD-10-CM | POA: Diagnosis not present

## 2017-06-10 DIAGNOSIS — S92312A Displaced fracture of first metatarsal bone, left foot, initial encounter for closed fracture: Secondary | ICD-10-CM | POA: Diagnosis not present

## 2017-06-10 DIAGNOSIS — E039 Hypothyroidism, unspecified: Secondary | ICD-10-CM | POA: Diagnosis not present

## 2017-06-10 NOTE — ED Notes (Signed)
Buddy tape and post op shoe applied to patient's right 4th and 5th toes. Procedure explained to the patient and understanding verbalized prior to procedure.  Patient tolerated procedure well.  Sensation unchanged throughout.    See radiology notes below.   Dg Foot Complete Right  Result Date: 06/10/2017 CLINICAL DATA:  Right fourth and fifth toe pain after stubbing foot against table last evening. EXAM: RIGHT FOOT COMPLETE - 3+ VIEW COMPARISON:  None. FINDINGS: Acute closed fractures of the right fourth and fifth proximal phalanges are identified. The fracture involving the fourth proximal phalanx is at the neck, transverse in orientation and without significant displacement. An oblique fracture of the distal fifth proximal phalanx with displacement of the distal fracture fragment by nearly 1 full shaft width is noted. No joint dislocation. No intra-articular extension of fractures. Mid and hindfoot are unremarkable. An accessory ossicle is seen adjacent to the tarsal navicular. There soft tissue swelling over the lateral malleolus and forefoot. Calcaneal enthesopathy is noted. IMPRESSION: 1. Acute nondisplaced transverse fracture of the distal fourth proximal phalanx at the neck. 2. Acute oblique fracture with lateral displacement involving the distal fifth proximal phalanx. 3. Soft tissue swelling over the lateral malleolus and forefoot. 4. Plantar and dorsal calcaneal enthesopathy. Electronically Signed   By: Ashley Royalty M.D.   On: 06/10/2017 20:33

## 2017-06-10 NOTE — ED Provider Notes (Signed)
[facility] EMERGENCY DEPARTMENT Provider Note   CSN: [id number] Arrival date & time: 06/10/17  1235     History   Chief Complaint Chief Complaint  Patient presents with  . Toe Pain    HPI Mackenzie Lewis is a 82 y.o. female.  The history is provided by the patient and medical records. No language interpreter was used.   Mackenzie Lewis is a 82 y.o. female  with a PMH as listed above who presents to the Emergency Department complaining of acute onset of right 4th and 5th toe pain since last night.  Patient states that she hit her toes on a table accidentally.  She went to urgent care this morning where she states x-rays were done showing fractures to her fourth and fifth toes.  She reports that urgent care called orthopedist, recommending that she come to the emergency department for further evaluation.  Unfortunately, I cannot see any records of urgent care visit in computer system and patient was given no paperwork from urgent care.  She has been taking Tylenol with some improvement as well as applying ice to the area.  She is on 81 mg aspirin, but no other blood thinners.  No numbness, tingling or weakness.  Pain is worse with ambulation or movement of the toes.  Past Medical History:  Diagnosis Date  . Hypertension   . Thyroid disease     Patient Active Problem List   Diagnosis Date Noted  . Atypical chest pain 09/09/2016  . Essential hypertension 09/09/2016  . Borderline diabetes 09/09/2016  . Benign hypertensive renal disease 09/01/2016  . Hypothyroidism 09/01/2016  . Hyperlipidemia 09/01/2016  . CKD (chronic kidney disease), stage II 09/01/2016  . Appendicitis with peritonitis 04/10/2016  . Bilateral primary osteoarthritis of knee 01/20/2016  . Osteoarthritis of both feet 01/20/2016  . Osteoarthritis, hand 01/20/2016  . Cyclic citrullinated peptide (CCP) antibody positive 01/20/2016    Past Surgical History:  Procedure Laterality Date  .  LAPAROSCOPIC APPENDECTOMY N/A 04/10/2016   Procedure: APPENDECTOMY LAPAROSCOPIC;  Surgeon: Coralie Keens, MD;  Location: [facility];  Service: General;  Laterality: N/A;     OB History   None      Home Medications    Prior to Admission medications   Medication Sig Start Date End Date Taking? Authorizing Provider  aspirin 81 MG EC tablet Take 81 mg by mouth daily. Swallow whole.    [provider]  Cholecalciferol (VITAMIN D3) 5000 units CAPS Take 1 capsule by mouth daily.    [provider]  KRILL OIL PO Take 1 capsule by mouth daily.     [provider]  levothyroxine (SYNTHROID, LEVOTHROID) 150 MCG tablet Take 75 mcg by mouth daily before breakfast.     [provider]  Olmesartan-Amlodipine-HCTZ (TRIBENZOR) 40-5-12.5 MG TABS Take 1 tablet by mouth daily.    [provider]  pantoprazole (PROTONIX) 40 MG tablet Take 40 mg by mouth daily.    [provider]  Probiotic Product (PROBIOTIC DAILY PO) Take by mouth.    [provider]    Family History Family History  Problem Relation Age of Onset  . Hypertension Mother   . Prostate cancer Father   . AAA (abdominal aortic aneurysm) Brother     Social History Social History   Tobacco Use  . Smoking status: Never Smoker  . Smokeless tobacco: Never Used  Substance Use Topics  . Alcohol use: No    Alcohol/week: 0.0 oz  .  Drug use: No     Allergies   Sulfa antibiotics   Review of Systems Review of Systems  Constitutional: Negative for fever.  Musculoskeletal: Positive for arthralgias and myalgias.  Skin: Negative for wound.  Allergic/Immunologic: Negative for immunocompromised state.  Neurological: Negative for weakness and numbness.  Hematological: Does not bruise/bleed easily.     Physical Exam Updated Vital Signs BP (!) 155/70 (BP Location: Right Arm)   Pulse 98   Temp 99 F (37.2 C) (Oral)   Resp 16   Ht 5\' 4"  (1.626 m)   Wt 97.1 kg (214 lb)    SpO2 94%   BMI 36.73 kg/m   Physical Exam  Constitutional: She is oriented to person, place, and time. She appears well-developed and well-nourished. No distress.  HENT:  Head: Normocephalic and atraumatic.  Neck: Neck supple.  Cardiovascular: Normal rate, regular rhythm and normal heart sounds.  No murmur heard. Pulmonary/Chest: Effort normal and breath sounds normal. No respiratory distress.  Musculoskeletal:  Tenderness to palpation of the right fourth and fifth toes.  No tenderness to malleoli or fifth metatarsal area.  No tenderness to the forefoot.  Sensation intact.  Able to wiggle all toes without difficulty.  2+ DP.  No open wounds.  Neurological: She is alert and oriented to person, place, and time.  Skin: Skin is warm and dry.  Nursing note and vitals reviewed.    ED Treatments / Results  Labs (all labs ordered are listed, but only abnormal results are displayed) Labs Reviewed - No data to display  EKG None  Radiology Dg Foot Complete Right  Result Date: 06/10/2017 CLINICAL DATA:  Right fourth and fifth toe pain after stubbing foot against table last evening. EXAM: RIGHT FOOT COMPLETE - 3+ VIEW COMPARISON:  None. FINDINGS: Acute closed fractures of the right fourth and fifth proximal phalanges are identified. The fracture involving the fourth proximal phalanx is at the neck, transverse in orientation and without significant displacement. An oblique fracture of the distal fifth proximal phalanx with displacement of the distal fracture fragment by nearly 1 full shaft width is noted. No joint dislocation. No intra-articular extension of fractures. Mid and hindfoot are unremarkable. An accessory ossicle is seen adjacent to the tarsal navicular. There soft tissue swelling over the lateral malleolus and forefoot. Calcaneal enthesopathy is noted. IMPRESSION: 1. Acute nondisplaced transverse fracture of the distal fourth proximal phalanx at the neck. 2. Acute oblique fracture with  lateral displacement involving the distal fifth proximal phalanx. 3. Soft tissue swelling over the lateral malleolus and forefoot. 4. Plantar and dorsal calcaneal enthesopathy. Electronically Signed   By: Ashley Royalty M.D.   On: 06/10/2017 20:33    Procedures Procedures (including critical care time)  Medications Ordered in ED Medications - No data to display   Initial Impression / Assessment and Plan / ED Course  I have reviewed the triage vital signs and the nursing notes.  Pertinent labs & imaging results that were available during my care of the patient were reviewed by me and considered in my medical decision making (see chart for details).    Mackenzie Lewis is a 82 y.o. female who presents to ED from urgent care for toe pain after hitting toe on a table last night. NVI with no open wounds on exam. Does have tenderness to 4th/5th digits. X-ray obtained showing acute nondisplaced transverse fracture of the fourth phalanx and acute fracture of distal fifth phalanx with lateral displacement.  Imaging reviewed with attending, Dr. Jeanell Sparrow.  Will buddy taped his place postop shoe.  Patient states that she does have a podiatrist in [place] who she would like to follow-up.  She will call on Monday morning to schedule this appointment.  Symptomatic home care instructions discussed as well.  All questions answered.  Patient discussed with Dr. Jeanell Sparrow who agrees with treatment plan.    Final Clinical Impressions(s) / ED Diagnoses   Final diagnoses:  Closed displaced fracture of proximal phalanx of lesser toe of right foot, initial encounter    ED Discharge Orders    None       Ward, Ozella Almond, PA-C 06/10/17 2110    Pattricia Boss, MD 06/11/17 [phone]

## 2017-06-10 NOTE — Discharge Instructions (Signed)
It was my pleasure taking care of you today!   Ice affected area for pain relief. Tylenol three times daily as needed for pain.  Keeping the foot elevated will help with pain / swelling.   Follow up with your podiatrist - call Monday morning to schedule an appointment.   COLD THERAPY DIRECTIONS:  Ice or gel packs can be used to reduce both pain and swelling. Ice is the most helpful within the first 24 to 48 hours after an injury or flareup from overusing a muscle or joint.  Ice is effective, has very few side effects, and is safe for most people to use.   If you expose your skin to cold temperatures for too long or without the proper protection, you can damage your skin or nerves. Watch for signs of skin damage due to cold.   HOME CARE INSTRUCTIONS  Follow these tips to use ice and cold packs safely.  Place a dry or damp towel between the ice and skin. A damp towel will cool the skin more quickly, so you may need to shorten the time that the ice is used.  For a more rapid response, add gentle compression to the ice.  Ice for no more than 10 to 20 minutes at a time. The bonier the area you are icing, the less time it will take to get the benefits of ice.  Check your skin after 5 minutes to make sure there are no signs of a poor response to cold or skin damage.  Rest 20 minutes or more in between uses.  Once your skin is numb, you can end your treatment. You can test numbness by very lightly touching your skin. The touch should be so light that you do not see the skin dimple from the pressure of your fingertip. When using ice, most people will feel these normal sensations in this order: cold, burning, aching, and numbness.

## 2017-06-10 NOTE — ED Notes (Signed)
Patient Alert and oriented to baseline. Stable and ambulatory to baseline. Patient verbalized understanding of the discharge instructions.  Patient belongings were taken by the patient.   

## 2017-06-10 NOTE — ED Triage Notes (Addendum)
Pt hit R 4th and 5th toe on a table last night, pt went [facility] with xrays that revealed fractures, pt sent here for eval, pt A&O x4, DP pulses present

## 2017-06-22 DIAGNOSIS — I83892 Varicose veins of left lower extremities with other complications: Secondary | ICD-10-CM | POA: Diagnosis not present

## 2017-06-22 DIAGNOSIS — I8312 Varicose veins of left lower extremity with inflammation: Secondary | ICD-10-CM | POA: Diagnosis not present

## 2017-07-25 DIAGNOSIS — I8312 Varicose veins of left lower extremity with inflammation: Secondary | ICD-10-CM | POA: Diagnosis not present

## 2017-07-25 DIAGNOSIS — I83892 Varicose veins of left lower extremities with other complications: Secondary | ICD-10-CM | POA: Diagnosis not present

## 2017-08-11 IMAGING — CT CT ABD-PELV W/ CM
2 of 5 series · 15 of 46 positions shown, 17 images · IV contrast (APPLIED)
Comparison: Ultrasound abdomen 05/27/2014

CLINICAL DATA: Right lower quadrant abdominal pain

EXAM:
CT ABDOMEN AND PELVIS WITH CONTRAST
TECHNIQUE: Multidetector CT imaging of the abdomen and pelvis was performed
using the standard protocol following bolus administration of
intravenous contrast.
CONTRAST:  100 mL [id number] intravenous

[Series 2: abd/ pelvis 5.0 i30f 1 · axial · 0.77mm/px · z∈[+626,+1056]mm · 12 of 98 slices shown, 14 images]
[im 6/98  soft-tissue]
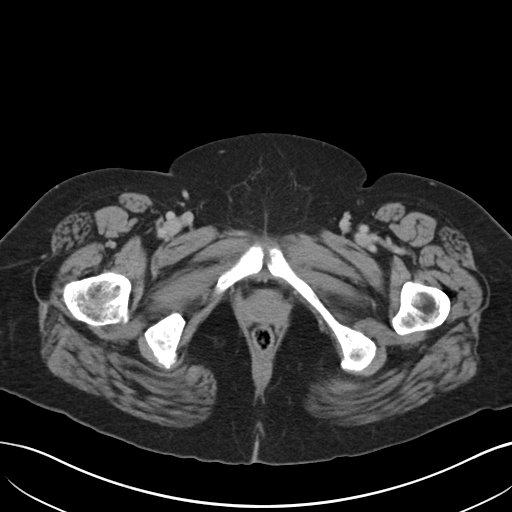
[im 6/98  bone]
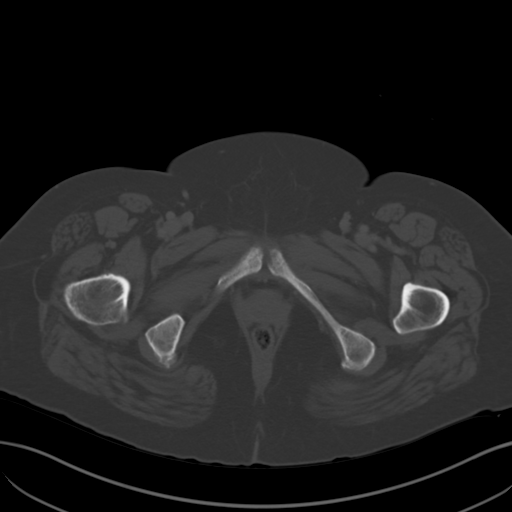
[im 17/98  soft-tissue]
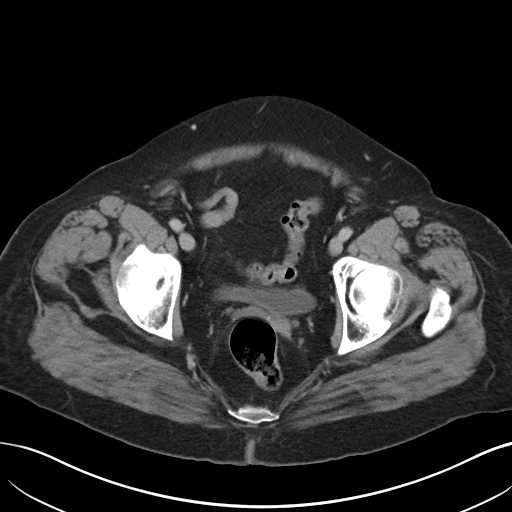
[im 22/98  soft-tissue]
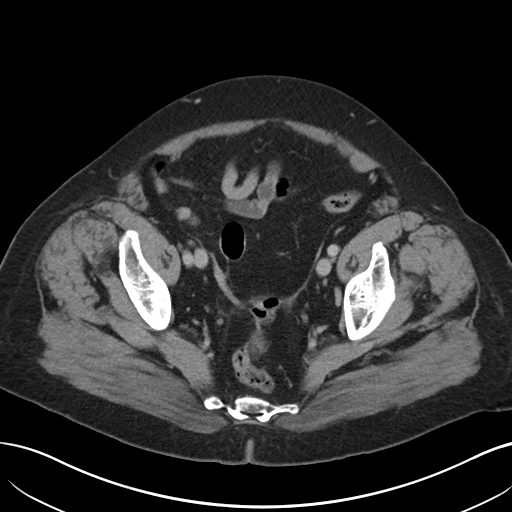
[im 27/98  soft-tissue]
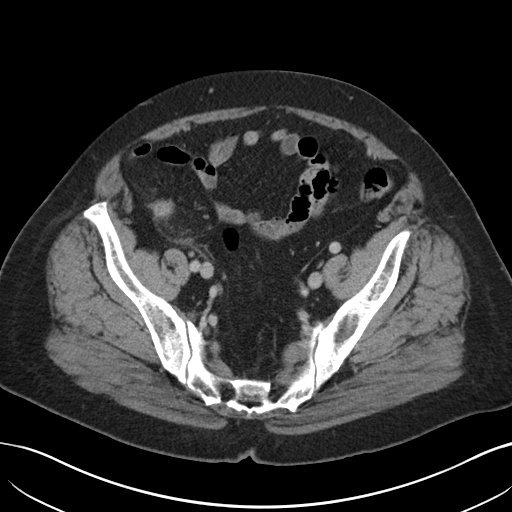
[im 38/98  soft-tissue]
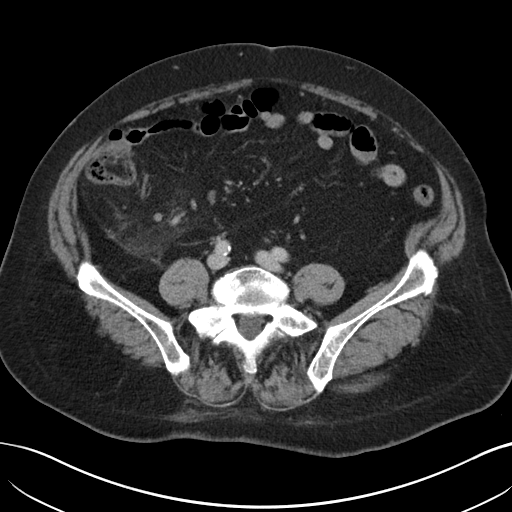
[im 44/98  soft-tissue]
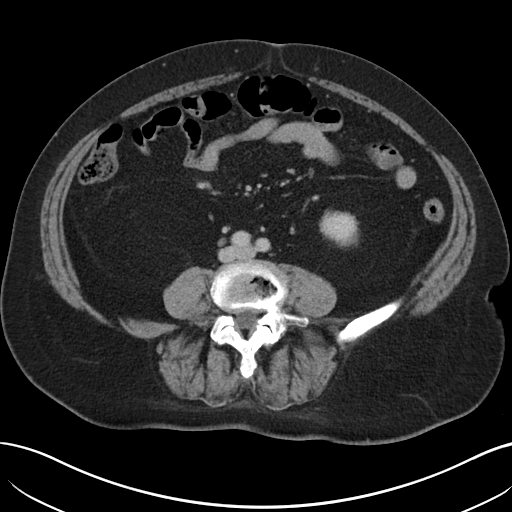
[im 54/98  soft-tissue]
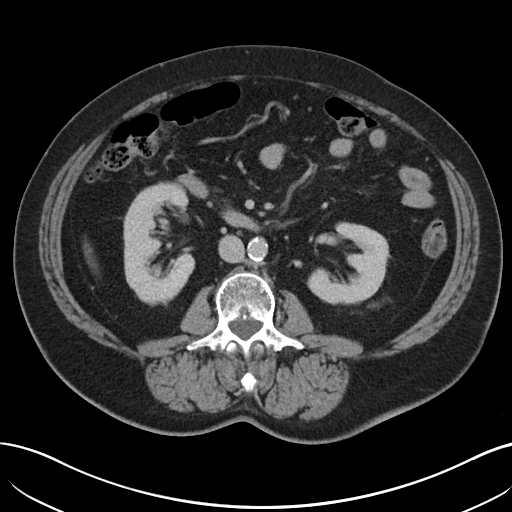
[im 60/98  soft-tissue]
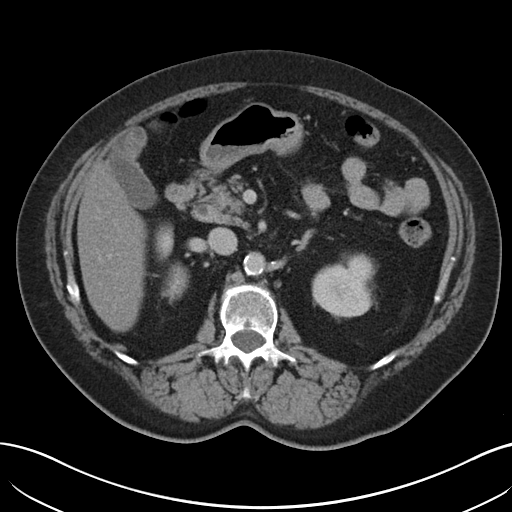
[im 71/98  soft-tissue]
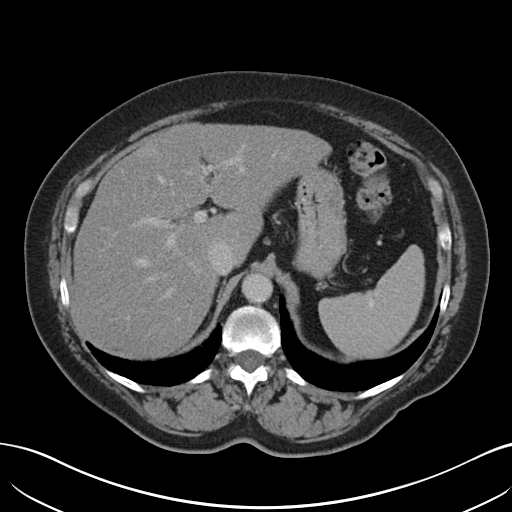
[im 71/98  bone]
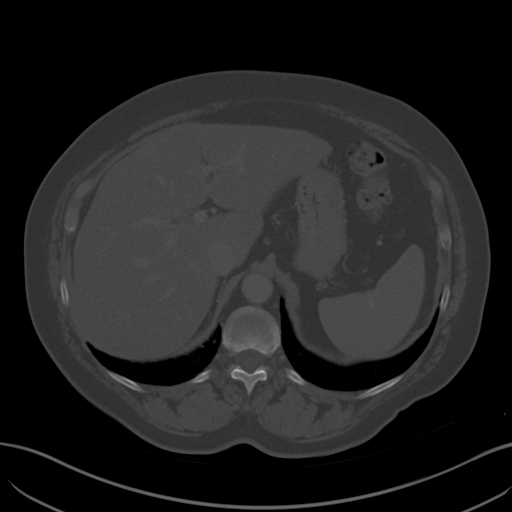
[im 76/98  soft-tissue]
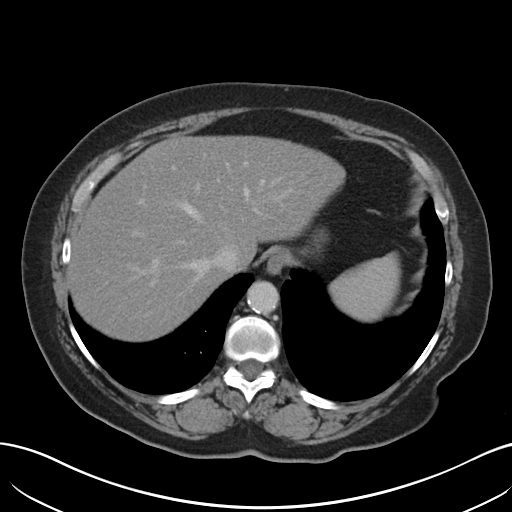
[im 81/98  soft-tissue]
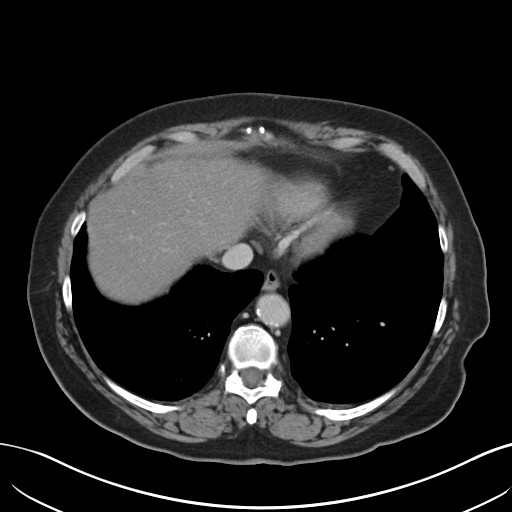
[im 92/98  soft-tissue]
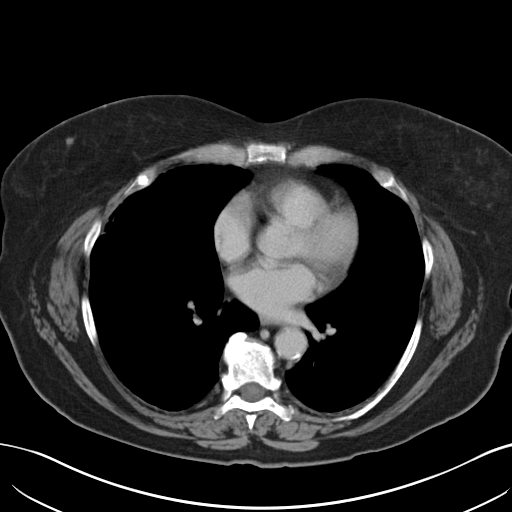

[Series 5: coronal soft tissue · coronal · 0.73mm/px · 3 of 98 slices shown]
[im 33/98  soft-tissue]
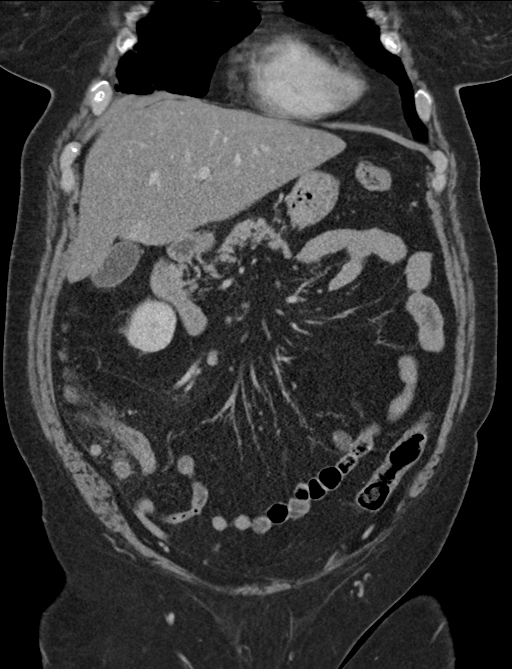
[im 44/98  soft-tissue]
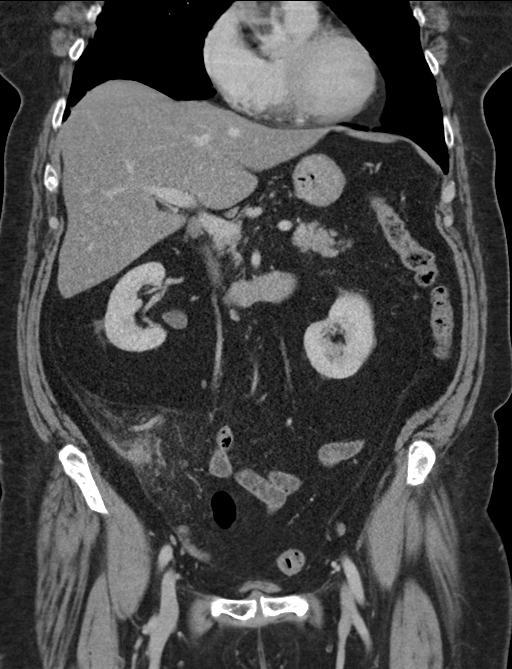
[im 54/98  soft-tissue]
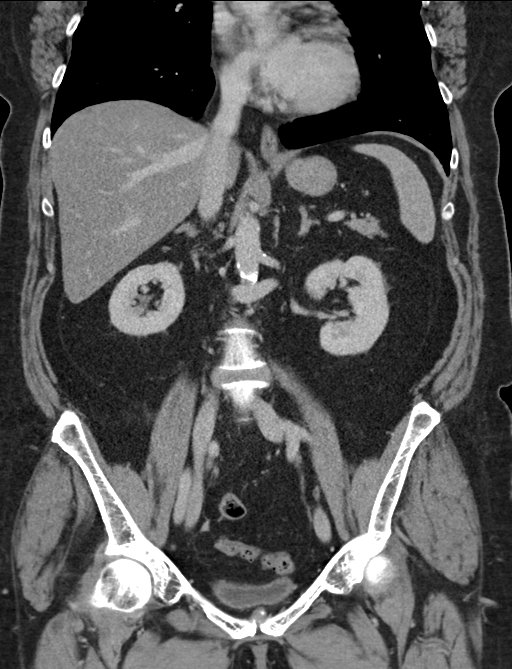

[15 of 46 positions shown; findings below may reference images not displayed]

FINDINGS: Lower chest: Mild scarring and fibrosis in the right middle lobe and
subpleural right lower lobe anteriorly. 6 mm pulmonary nodule
anterior left lung base. 7 mm pulmonary nodule posterior left lung
base. Heart size nonenlarged.

Hepatobiliary: Hepatic steatosis. No calcified gallstones. No
biliary dilatation.

Pancreas: Unremarkable. No pancreatic ductal dilatation or
surrounding inflammatory changes.

Spleen: Normal in size without focal abnormality.

Adrenals/Urinary Tract: Adrenal glands are within normal limits.
Kidneys show no focal abnormality. No ureteral stones. Bladder
normal.

1.5 cm left posterior perinephric solid nodule.

Stomach/Bowel: Stomach nonenlarged.  No dilated small bowel.

Inflammatory process in the right lower quadrant. The appendix is
enlarged, measuring up to 9 mm. The distal lumen and tip or poorly
defined. Moderate phlegmonous change and inflammation. No
extraluminal gas bubbles.

Vascular/Lymphatic: Multiple enlarged right lower quadrant lymph
nodes. Atherosclerosis of the aorta.

Reproductive: Status post hysterectomy. No adnexal masses.

Other: No free air.  Trace free fluid in the pelvis.

Musculoskeletal: Degenerative changes of the spine. No acute or
suspicious bone lesions.
IMPRESSION: 1. Findings consistent with acute appendicitis. Moderate phlegmonous
change and inflammatory response in the right lower quadrant around
the appendix with poor definition of the distal lumen and tip,
cannot exclude micro perforation. No extraluminal gas, free air, or
abscess is visualized.
2. Multiple enlarged right lower quadrant lymph nodes, likely
reactive.
3. 2 pulmonary nodules in the left lower lobe. No follow-up needed
if patient is low-risk (and has no known or suspected primary
neoplasm). Non-contrast chest CT can be considered in 12 months if
patient is high-risk. This recommendation follows the consensus
statement: Guidelines for Management of Incidental Pulmonary Nodules
Detected on CT Images: From the [HOSPITAL] 6507; Radiology
6507; [DATE].
4. 1.5 cm solid left posterior periureteral nodule could represent
an enlarged lymph node or possible small solid exophytic renal
lesion.
5. Hepatic steatosis

## 2017-08-15 DIAGNOSIS — E063 Autoimmune thyroiditis: Secondary | ICD-10-CM | POA: Diagnosis not present

## 2017-08-15 DIAGNOSIS — I10 Essential (primary) hypertension: Secondary | ICD-10-CM | POA: Diagnosis not present

## 2017-08-15 DIAGNOSIS — R748 Abnormal levels of other serum enzymes: Secondary | ICD-10-CM | POA: Diagnosis not present

## 2017-08-15 DIAGNOSIS — E785 Hyperlipidemia, unspecified: Secondary | ICD-10-CM | POA: Diagnosis not present

## 2017-08-15 DIAGNOSIS — Z79899 Other long term (current) drug therapy: Secondary | ICD-10-CM | POA: Diagnosis not present

## 2017-08-15 DIAGNOSIS — G4733 Obstructive sleep apnea (adult) (pediatric): Secondary | ICD-10-CM | POA: Diagnosis not present

## 2017-12-16 DIAGNOSIS — E669 Obesity, unspecified: Secondary | ICD-10-CM | POA: Diagnosis not present

## 2017-12-16 DIAGNOSIS — Z79899 Other long term (current) drug therapy: Secondary | ICD-10-CM | POA: Diagnosis not present

## 2017-12-16 DIAGNOSIS — K219 Gastro-esophageal reflux disease without esophagitis: Secondary | ICD-10-CM | POA: Diagnosis not present

## 2017-12-16 DIAGNOSIS — G4733 Obstructive sleep apnea (adult) (pediatric): Secondary | ICD-10-CM | POA: Diagnosis not present

## 2017-12-16 DIAGNOSIS — J0100 Acute maxillary sinusitis, unspecified: Secondary | ICD-10-CM | POA: Diagnosis not present

## 2017-12-16 DIAGNOSIS — I10 Essential (primary) hypertension: Secondary | ICD-10-CM | POA: Diagnosis not present

## 2017-12-16 DIAGNOSIS — E785 Hyperlipidemia, unspecified: Secondary | ICD-10-CM | POA: Diagnosis not present

## 2017-12-16 DIAGNOSIS — Z6836 Body mass index (BMI) 36.0-36.9, adult: Secondary | ICD-10-CM | POA: Diagnosis not present

## 2017-12-16 DIAGNOSIS — R1310 Dysphagia, unspecified: Secondary | ICD-10-CM | POA: Diagnosis not present

## 2017-12-16 DIAGNOSIS — E038 Other specified hypothyroidism: Secondary | ICD-10-CM | POA: Diagnosis not present

## 2018-01-05 DIAGNOSIS — R1310 Dysphagia, unspecified: Secondary | ICD-10-CM | POA: Diagnosis not present

## 2018-01-05 DIAGNOSIS — K449 Diaphragmatic hernia without obstruction or gangrene: Secondary | ICD-10-CM | POA: Diagnosis not present

## 2018-01-10 DIAGNOSIS — M25512 Pain in left shoulder: Secondary | ICD-10-CM | POA: Diagnosis not present

## 2018-01-10 DIAGNOSIS — M549 Dorsalgia, unspecified: Secondary | ICD-10-CM | POA: Diagnosis not present

## 2018-02-01 DIAGNOSIS — F5101 Primary insomnia: Secondary | ICD-10-CM | POA: Diagnosis not present

## 2018-02-01 DIAGNOSIS — I10 Essential (primary) hypertension: Secondary | ICD-10-CM | POA: Diagnosis not present

## 2018-02-01 DIAGNOSIS — E669 Obesity, unspecified: Secondary | ICD-10-CM | POA: Diagnosis not present

## 2018-02-01 DIAGNOSIS — E038 Other specified hypothyroidism: Secondary | ICD-10-CM | POA: Diagnosis not present

## 2018-02-01 DIAGNOSIS — K219 Gastro-esophageal reflux disease without esophagitis: Secondary | ICD-10-CM | POA: Diagnosis not present

## 2018-02-01 DIAGNOSIS — Z6835 Body mass index (BMI) 35.0-35.9, adult: Secondary | ICD-10-CM | POA: Diagnosis not present

## 2018-02-01 DIAGNOSIS — Q278 Other specified congenital malformations of peripheral vascular system: Secondary | ICD-10-CM | POA: Diagnosis not present

## 2018-02-01 DIAGNOSIS — G4733 Obstructive sleep apnea (adult) (pediatric): Secondary | ICD-10-CM | POA: Diagnosis not present

## 2018-02-01 DIAGNOSIS — M549 Dorsalgia, unspecified: Secondary | ICD-10-CM | POA: Diagnosis not present

## 2018-02-27 DIAGNOSIS — H25813 Combined forms of age-related cataract, bilateral: Secondary | ICD-10-CM | POA: Diagnosis not present

## 2018-02-28 DIAGNOSIS — J111 Influenza due to unidentified influenza virus with other respiratory manifestations: Secondary | ICD-10-CM | POA: Diagnosis not present

## 2018-03-23 ENCOUNTER — Encounter: Payer: Self-pay | Admitting: Cardiology

## 2018-03-23 ENCOUNTER — Ambulatory Visit (INDEPENDENT_AMBULATORY_CARE_PROVIDER_SITE_OTHER): Payer: Medicare Other | Admitting: Cardiology

## 2018-03-23 VITALS — BP 124/72 | HR 87 | Ht 64.0 in | Wt 210.4 lb

## 2018-03-23 DIAGNOSIS — R0789 Other chest pain: Secondary | ICD-10-CM | POA: Diagnosis not present

## 2018-03-23 DIAGNOSIS — R7303 Prediabetes: Secondary | ICD-10-CM | POA: Diagnosis not present

## 2018-03-23 DIAGNOSIS — I10 Essential (primary) hypertension: Secondary | ICD-10-CM

## 2018-03-23 DIAGNOSIS — E782 Mixed hyperlipidemia: Secondary | ICD-10-CM | POA: Diagnosis not present

## 2018-03-23 NOTE — Progress Notes (Signed)
Cardiology Office Note:    Date:  03/23/2018   ID:  Mackenzie Lewis, DOB [birth year], MRN [id number]  PCP:  Glendale Chard, MD  Cardiologist:  Jenne Campus, MD    Referring MD: Glendale Chard, MD   Chief Complaint  Patient presents with  . Follow-up  Doing well  History of Present Illness:    Mackenzie Lewis is a 83 y.o. female with borderline diabetes hypertension obesity hyperlipidemia comes today to office for follow-up cardiac wise seems to be doing fine described to have some pain in the chest which is relieved by proton pump inhibitors.  She will have had some work done in her esophagus.  Previously we did stress test which was negative she does not have exertional chest pain.  She is trying to be a little more active and have no difficulty doing it.  She lost some weight and she is happy with it.  She is telling me that she did have some esophageal study done which showed some vessel crossing her esophagus and pressing there I will log into computer system in front of hospital trying to investigate exactly what was done what it showed.  Past Medical History:  Diagnosis Date  . Hypertension   . Thyroid disease     Past Surgical History:  Procedure Laterality Date  . LAPAROSCOPIC APPENDECTOMY N/A 04/10/2016   Procedure: APPENDECTOMY LAPAROSCOPIC;  Surgeon: Coralie Keens, MD;  Location: [facility];  Service: General;  Laterality: N/A;    Current Medications: Current Meds  Medication Sig  . aspirin 81 MG EC tablet Take 81 mg by mouth daily. Swallow whole.  . Cholecalciferol (VITAMIN D3) 5000 units CAPS Take 1 capsule by mouth daily.  Marland Kitchen KRILL OIL PO Take 1 capsule by mouth daily.   Marland Kitchen levothyroxine (SYNTHROID, LEVOTHROID) 75 MCG tablet Take 75 mcg by mouth daily before breakfast.   . Olmesartan-Amlodipine-HCTZ (TRIBENZOR) 40-5-12.5 MG TABS Take 1 tablet by mouth daily.  . pantoprazole (PROTONIX) 40 MG tablet Take 40 mg by mouth daily.  . Probiotic Product (PROBIOTIC DAILY PO)  Take by mouth.     Allergies:   Sulfa antibiotics   Social History   Socioeconomic History  . Marital status: Divorced    Spouse name: Not on file  . Number of children: Not on file  . Years of education: Not on file  . Highest education level: Not on file  Occupational History  . Not on file  Social Needs  . Financial resource strain: Not on file  . Food insecurity:    Worry: Not on file    Inability: Not on file  . Transportation needs:    Medical: Not on file    Non-medical: Not on file  Tobacco Use  . Smoking status: Never Smoker  . Smokeless tobacco: Never Used  Substance and Sexual Activity  . Alcohol use: No    Alcohol/week: 0.0 standard drinks  . Drug use: No  . Sexual activity: Not on file  Lifestyle  . Physical activity:    Days per week: Not on file    Minutes per session: Not on file  . Stress: Not on file  Relationships  . Social connections:    Talks on phone: Not on file    Gets together: Not on file    Attends religious service: Not on file    Active member of club or organization: Not on file    Attends meetings of clubs or organizations: Not on file  Relationship status: Not on file  Other Topics Concern  . Not on file  Social History Narrative  . Not on file     Family History: The patient's family history includes AAA (abdominal aortic aneurysm) in her brother; Hypertension in her mother; Prostate cancer in her father. ROS:   Please see the history of present illness.    All 14 point review of systems negative except as described per history of present illness  EKGs/Labs/Other Studies Reviewed:    EKG done today showed normal sinus rhythm normal P interval normal QS complex duration morphology  Recent Labs: No results found for requested labs within last 8760 hours.  Recent Lipid Panel No results found for: CHOL, TRIG, HDL, CHOLHDL, VLDL, LDLCALC, LDLDIRECT  Physical Exam:    VS:  BP 124/72   Pulse 87   Ht 5\' 4"  (1.626 m)    Wt 210 lb 6.4 oz (95.4 kg)   SpO2 97%   BMI 36.12 kg/m     Wt Readings from Last 3 Encounters:  03/23/18 210 lb 6.4 oz (95.4 kg)  06/10/17 214 lb (97.1 kg)  02/03/17 220 lb (99.8 kg)     GEN:  Well nourished, well developed in no acute distress HEENT: Normal NECK: No JVD; No carotid bruits LYMPHATICS: No lymphadenopathy CARDIAC: RRR, no murmurs, no rubs, no gallops RESPIRATORY:  Clear to auscultation without rales, wheezing or rhonchi  ABDOMEN: Soft, non-tender, non-distended MUSCULOSKELETAL:  No edema; No deformity  SKIN: Warm and dry LOWER EXTREMITIES: no swelling NEUROLOGIC:  Alert and oriented x 3 PSYCHIATRIC:  Normal affect   ASSESSMENT:    1. Essential hypertension   2. Mixed hyperlipidemia   3. Borderline diabetes   4. Atypical chest pain    PLAN:    In order of problems listed above:  1. Essential hypertension blood pressure well controlled continue present management. 2. Mixed dyslipidemia will call primary care physician to get fasting lipid profile last numbers have from 2018 which showing high HDL but also has slightly elevated LDL. 3. Borderline diabetes apparently stable followed by internal medicine team. 4. Atypical chest pain does not look like it is related to her heart but will do EKG today and will get test from the hospital. We will retrieve records from the hospital she did have barium swallow which suggest anomalous right subclavian artery.  She is completely asymptomatic from that point of view.  Therefore we will just monitor that.  Medication Adjustments/Labs and Tests Ordered: Current medicines are reviewed at length with the patient today.  Concerns regarding medicines are outlined above.  No orders of the defined types were placed in this encounter.  Medication changes: No orders of the defined types were placed in this encounter.   Signed, Park Liter, MD, [facility] 03/23/2018 8:34 AM    [facility]

## 2018-03-23 NOTE — Patient Instructions (Signed)
Medication Instructions:  Your physician recommends that you continue on your current medications as directed. Please refer to the Current Medication list given to you today.  If you need a refill on your cardiac medications before your next appointment, please call your pharmacy.   Lab work: None.  If you have labs (blood work) drawn today and your tests are completely normal, you will receive your results only by: Marland Kitchen MyChart Message (if you have MyChart) OR . A paper copy in the mail If you have any lab test that is abnormal or we need to change your treatment, we will call you to review the results.  Testing/Procedures: None.  Follow-Up: At [facility], you and your health needs are our priority.  As part of our continuing mission to provide you with exceptional heart care, we have created designated Provider Care Teams.  These Care Teams include your primary Cardiologist (physician) and Advanced Practice Providers (APPs -  Physician Assistants and Nurse Practitioners) who all work together t2o provide you with the care you need, when you need it. You will need a follow up appointment in years.  Please call our office 2 months in advance to schedule this appointment.  You may see No primary care provider on file. or another member of our [employer] Provider Team in [place]: Shirlee More, MD . Jyl Heinz, MD  Any Other Special Instructions Will Be Listed Below (If Applicable).

## 2018-05-08 DIAGNOSIS — E785 Hyperlipidemia, unspecified: Secondary | ICD-10-CM | POA: Diagnosis not present

## 2018-05-08 DIAGNOSIS — I10 Essential (primary) hypertension: Secondary | ICD-10-CM | POA: Diagnosis not present

## 2018-05-08 DIAGNOSIS — Z6836 Body mass index (BMI) 36.0-36.9, adult: Secondary | ICD-10-CM | POA: Diagnosis not present

## 2018-05-08 DIAGNOSIS — E063 Autoimmune thyroiditis: Secondary | ICD-10-CM | POA: Diagnosis not present

## 2018-05-08 DIAGNOSIS — K219 Gastro-esophageal reflux disease without esophagitis: Secondary | ICD-10-CM | POA: Diagnosis not present

## 2018-05-08 DIAGNOSIS — Z79899 Other long term (current) drug therapy: Secondary | ICD-10-CM | POA: Diagnosis not present

## 2018-05-08 DIAGNOSIS — R7301 Impaired fasting glucose: Secondary | ICD-10-CM | POA: Diagnosis not present

## 2018-05-08 DIAGNOSIS — G4733 Obstructive sleep apnea (adult) (pediatric): Secondary | ICD-10-CM | POA: Diagnosis not present

## 2018-05-08 DIAGNOSIS — E038 Other specified hypothyroidism: Secondary | ICD-10-CM | POA: Diagnosis not present

## 2018-07-07 DIAGNOSIS — Z01818 Encounter for other preprocedural examination: Secondary | ICD-10-CM | POA: Diagnosis not present

## 2018-07-07 DIAGNOSIS — H40003 Preglaucoma, unspecified, bilateral: Secondary | ICD-10-CM | POA: Diagnosis not present

## 2018-07-07 DIAGNOSIS — H25812 Combined forms of age-related cataract, left eye: Secondary | ICD-10-CM | POA: Diagnosis not present

## 2018-08-08 DIAGNOSIS — H40003 Preglaucoma, unspecified, bilateral: Secondary | ICD-10-CM | POA: Diagnosis not present

## 2018-08-08 DIAGNOSIS — H25812 Combined forms of age-related cataract, left eye: Secondary | ICD-10-CM | POA: Diagnosis not present

## 2018-08-08 DIAGNOSIS — H259 Unspecified age-related cataract: Secondary | ICD-10-CM | POA: Diagnosis not present

## 2018-08-29 DIAGNOSIS — K219 Gastro-esophageal reflux disease without esophagitis: Secondary | ICD-10-CM | POA: Diagnosis not present

## 2018-08-29 DIAGNOSIS — E039 Hypothyroidism, unspecified: Secondary | ICD-10-CM | POA: Diagnosis not present

## 2018-08-29 DIAGNOSIS — Z7982 Long term (current) use of aspirin: Secondary | ICD-10-CM | POA: Diagnosis not present

## 2018-08-29 DIAGNOSIS — G4730 Sleep apnea, unspecified: Secondary | ICD-10-CM | POA: Diagnosis not present

## 2018-08-29 DIAGNOSIS — H25811 Combined forms of age-related cataract, right eye: Secondary | ICD-10-CM | POA: Diagnosis not present

## 2018-08-29 DIAGNOSIS — Z79899 Other long term (current) drug therapy: Secondary | ICD-10-CM | POA: Diagnosis not present

## 2018-08-29 DIAGNOSIS — H259 Unspecified age-related cataract: Secondary | ICD-10-CM | POA: Diagnosis not present

## 2018-08-29 DIAGNOSIS — Z9989 Dependence on other enabling machines and devices: Secondary | ICD-10-CM | POA: Diagnosis not present

## 2018-09-13 ENCOUNTER — Other Ambulatory Visit: Payer: Self-pay

## 2018-10-13 DIAGNOSIS — H26492 Other secondary cataract, left eye: Secondary | ICD-10-CM | POA: Diagnosis not present

## 2018-10-13 DIAGNOSIS — H04123 Dry eye syndrome of bilateral lacrimal glands: Secondary | ICD-10-CM | POA: Diagnosis not present

## 2018-10-13 DIAGNOSIS — H02833 Dermatochalasis of right eye, unspecified eyelid: Secondary | ICD-10-CM | POA: Diagnosis not present

## 2018-10-16 DIAGNOSIS — Z0000 Encounter for general adult medical examination without abnormal findings: Secondary | ICD-10-CM | POA: Diagnosis not present

## 2018-10-16 DIAGNOSIS — E785 Hyperlipidemia, unspecified: Secondary | ICD-10-CM | POA: Diagnosis not present

## 2018-10-16 DIAGNOSIS — Z1339 Encounter for screening examination for other mental health and behavioral disorders: Secondary | ICD-10-CM | POA: Diagnosis not present

## 2018-10-16 DIAGNOSIS — R7301 Impaired fasting glucose: Secondary | ICD-10-CM | POA: Diagnosis not present

## 2018-10-16 DIAGNOSIS — R1013 Epigastric pain: Secondary | ICD-10-CM | POA: Diagnosis not present

## 2018-10-16 DIAGNOSIS — R5383 Other fatigue: Secondary | ICD-10-CM | POA: Diagnosis not present

## 2018-10-16 DIAGNOSIS — M25511 Pain in right shoulder: Secondary | ICD-10-CM | POA: Diagnosis not present

## 2018-10-16 DIAGNOSIS — E038 Other specified hypothyroidism: Secondary | ICD-10-CM | POA: Diagnosis not present

## 2018-10-16 DIAGNOSIS — K219 Gastro-esophageal reflux disease without esophagitis: Secondary | ICD-10-CM | POA: Diagnosis not present

## 2018-10-16 DIAGNOSIS — Z79899 Other long term (current) drug therapy: Secondary | ICD-10-CM | POA: Diagnosis not present

## 2018-10-16 DIAGNOSIS — Z1231 Encounter for screening mammogram for malignant neoplasm of breast: Secondary | ICD-10-CM | POA: Diagnosis not present

## 2018-10-16 DIAGNOSIS — Z1331 Encounter for screening for depression: Secondary | ICD-10-CM | POA: Diagnosis not present

## 2018-10-16 DIAGNOSIS — Z6836 Body mass index (BMI) 36.0-36.9, adult: Secondary | ICD-10-CM | POA: Diagnosis not present

## 2018-10-16 DIAGNOSIS — I10 Essential (primary) hypertension: Secondary | ICD-10-CM | POA: Diagnosis not present

## 2018-10-30 DIAGNOSIS — R1310 Dysphagia, unspecified: Secondary | ICD-10-CM | POA: Diagnosis not present

## 2018-10-30 DIAGNOSIS — K219 Gastro-esophageal reflux disease without esophagitis: Secondary | ICD-10-CM | POA: Diagnosis not present

## 2018-11-09 DIAGNOSIS — H02833 Dermatochalasis of right eye, unspecified eyelid: Secondary | ICD-10-CM | POA: Diagnosis not present

## 2018-11-09 DIAGNOSIS — H02402 Unspecified ptosis of left eyelid: Secondary | ICD-10-CM | POA: Diagnosis not present

## 2018-11-09 DIAGNOSIS — Z01818 Encounter for other preprocedural examination: Secondary | ICD-10-CM | POA: Diagnosis not present

## 2018-11-14 DIAGNOSIS — H26492 Other secondary cataract, left eye: Secondary | ICD-10-CM | POA: Diagnosis not present

## 2018-11-21 DIAGNOSIS — Z90710 Acquired absence of both cervix and uterus: Secondary | ICD-10-CM | POA: Diagnosis not present

## 2018-11-21 DIAGNOSIS — Z79899 Other long term (current) drug therapy: Secondary | ICD-10-CM | POA: Diagnosis not present

## 2018-11-21 DIAGNOSIS — I10 Essential (primary) hypertension: Secondary | ICD-10-CM | POA: Diagnosis not present

## 2018-11-21 DIAGNOSIS — K219 Gastro-esophageal reflux disease without esophagitis: Secondary | ICD-10-CM | POA: Diagnosis not present

## 2018-11-21 DIAGNOSIS — K449 Diaphragmatic hernia without obstruction or gangrene: Secondary | ICD-10-CM | POA: Diagnosis not present

## 2018-11-21 DIAGNOSIS — R1310 Dysphagia, unspecified: Secondary | ICD-10-CM | POA: Diagnosis not present

## 2018-11-21 DIAGNOSIS — Z7982 Long term (current) use of aspirin: Secondary | ICD-10-CM | POA: Diagnosis not present

## 2018-11-21 DIAGNOSIS — R634 Abnormal weight loss: Secondary | ICD-10-CM | POA: Diagnosis not present

## 2018-11-27 DIAGNOSIS — H02402 Unspecified ptosis of left eyelid: Secondary | ICD-10-CM | POA: Diagnosis not present

## 2018-11-27 DIAGNOSIS — H02834 Dermatochalasis of left upper eyelid: Secondary | ICD-10-CM | POA: Diagnosis not present

## 2018-11-27 DIAGNOSIS — H02831 Dermatochalasis of right upper eyelid: Secondary | ICD-10-CM | POA: Diagnosis not present

## 2018-12-21 DIAGNOSIS — Z1231 Encounter for screening mammogram for malignant neoplasm of breast: Secondary | ICD-10-CM | POA: Diagnosis not present

## 2019-01-01 DIAGNOSIS — M19011 Primary osteoarthritis, right shoulder: Secondary | ICD-10-CM | POA: Diagnosis not present

## 2019-01-01 DIAGNOSIS — Z6835 Body mass index (BMI) 35.0-35.9, adult: Secondary | ICD-10-CM | POA: Diagnosis not present

## 2019-01-01 DIAGNOSIS — M545 Low back pain: Secondary | ICD-10-CM | POA: Diagnosis not present

## 2019-01-01 DIAGNOSIS — R350 Frequency of micturition: Secondary | ICD-10-CM | POA: Diagnosis not present

## 2019-01-09 DIAGNOSIS — M545 Low back pain: Secondary | ICD-10-CM | POA: Diagnosis not present

## 2019-01-09 DIAGNOSIS — R531 Weakness: Secondary | ICD-10-CM | POA: Diagnosis not present

## 2019-01-09 DIAGNOSIS — M25611 Stiffness of right shoulder, not elsewhere classified: Secondary | ICD-10-CM | POA: Diagnosis not present

## 2019-01-09 DIAGNOSIS — M19011 Primary osteoarthritis, right shoulder: Secondary | ICD-10-CM | POA: Diagnosis not present

## 2019-01-09 DIAGNOSIS — M25511 Pain in right shoulder: Secondary | ICD-10-CM | POA: Diagnosis not present

## 2019-01-15 DIAGNOSIS — M19011 Primary osteoarthritis, right shoulder: Secondary | ICD-10-CM | POA: Diagnosis not present

## 2019-01-15 DIAGNOSIS — M25511 Pain in right shoulder: Secondary | ICD-10-CM | POA: Diagnosis not present

## 2019-01-15 DIAGNOSIS — M545 Low back pain: Secondary | ICD-10-CM | POA: Diagnosis not present

## 2019-01-15 DIAGNOSIS — M25611 Stiffness of right shoulder, not elsewhere classified: Secondary | ICD-10-CM | POA: Diagnosis not present

## 2019-01-15 DIAGNOSIS — R531 Weakness: Secondary | ICD-10-CM | POA: Diagnosis not present

## 2019-01-17 DIAGNOSIS — K219 Gastro-esophageal reflux disease without esophagitis: Secondary | ICD-10-CM | POA: Diagnosis not present

## 2019-01-17 DIAGNOSIS — K5900 Constipation, unspecified: Secondary | ICD-10-CM | POA: Diagnosis not present

## 2019-01-19 DIAGNOSIS — M545 Low back pain: Secondary | ICD-10-CM | POA: Diagnosis not present

## 2019-01-19 DIAGNOSIS — R531 Weakness: Secondary | ICD-10-CM | POA: Diagnosis not present

## 2019-01-19 DIAGNOSIS — M25611 Stiffness of right shoulder, not elsewhere classified: Secondary | ICD-10-CM | POA: Diagnosis not present

## 2019-01-19 DIAGNOSIS — M25511 Pain in right shoulder: Secondary | ICD-10-CM | POA: Diagnosis not present

## 2019-01-19 DIAGNOSIS — M19011 Primary osteoarthritis, right shoulder: Secondary | ICD-10-CM | POA: Diagnosis not present

## 2019-03-23 ENCOUNTER — Other Ambulatory Visit: Payer: Self-pay

## 2019-03-23 ENCOUNTER — Ambulatory Visit (INDEPENDENT_AMBULATORY_CARE_PROVIDER_SITE_OTHER): Payer: Medicare Other | Admitting: Cardiology

## 2019-03-23 ENCOUNTER — Encounter: Payer: Self-pay | Admitting: Cardiology

## 2019-03-23 VITALS — BP 126/82 | HR 89 | Ht 64.0 in | Wt 210.0 lb

## 2019-03-23 DIAGNOSIS — E039 Hypothyroidism, unspecified: Secondary | ICD-10-CM

## 2019-03-23 DIAGNOSIS — I10 Essential (primary) hypertension: Secondary | ICD-10-CM | POA: Diagnosis not present

## 2019-03-23 DIAGNOSIS — R7303 Prediabetes: Secondary | ICD-10-CM

## 2019-03-23 DIAGNOSIS — R0789 Other chest pain: Secondary | ICD-10-CM

## 2019-03-23 DIAGNOSIS — E782 Mixed hyperlipidemia: Secondary | ICD-10-CM | POA: Diagnosis not present

## 2019-03-23 NOTE — Addendum Note (Signed)
Addended by: Ashok Norris on: 03/23/2019 02:18 PM   Modules accepted: Orders

## 2019-03-23 NOTE — Progress Notes (Signed)
Cardiology Office Note:    Date:  03/23/2019   ID:  Mackenzie Lewis, DOB 05/31/1935, MRN [id number]  PCP:  Glendale Chard, MD  Cardiologist:  Jenne Campus, MD    Referring MD: Glendale Chard, MD   Chief Complaint  Patient presents with  . Follow-up    1 YR FU     History of Present Illness:    Mackenzie Lewis is a 84 y.o. female with multiple risk factors for coronary artery disease history include hypertension borderline diabetes dyslipidemia.  I been following her for some atypical symptoms she did have a atypical chest pain, that being managed with proton pump inhibitor with very good response she denies having any chest pain right now.  She admits that she does not exercise on a regular basis but she is trying to be active.  She lost 3 pounds and she is very proud of herself.  Past Medical History:  Diagnosis Date  . Hypertension   . Thyroid disease     Past Surgical History:  Procedure Laterality Date  . LAPAROSCOPIC APPENDECTOMY N/A 04/10/2016   Procedure: APPENDECTOMY LAPAROSCOPIC;  Surgeon: Coralie Keens, MD;  Location: [place];  Service: General;  Laterality: N/A;    Current Medications: Current Meds  Medication Sig  . aspirin 81 MG EC tablet Take 81 mg by mouth daily. Swallow whole.  . Cholecalciferol (VITAMIN D3) 5000 units CAPS Take 1 capsule by mouth daily.  Marland Kitchen KRILL OIL PO Take 1 capsule by mouth daily.   Marland Kitchen levothyroxine (SYNTHROID, LEVOTHROID) 75 MCG tablet Take 75 mcg by mouth daily before breakfast.   . Olmesartan-Amlodipine-HCTZ (TRIBENZOR) 40-5-12.5 MG TABS Take 1 tablet by mouth daily.  . pantoprazole (PROTONIX) 40 MG tablet Take 40 mg by mouth daily.  . Probiotic Product (PROBIOTIC DAILY PO) Take by mouth.     Allergies:   Sulfa antibiotics   Social History   Socioeconomic History  . Marital status: Divorced    Spouse name: Not on file  . Number of children: Not on file  . Years of education: Not on file  . Highest education level: Not on file   Occupational History  . Not on file  Tobacco Use  . Smoking status: Never Smoker  . Smokeless tobacco: Never Used  Substance and Sexual Activity  . Alcohol use: No    Alcohol/week: 0.0 standard drinks  . Drug use: No  . Sexual activity: Not on file  Other Topics Concern  . Not on file  Social History Narrative  . Not on file   Social Determinants of Health   Financial Resource Strain:   . Difficulty of Paying Living Expenses: Not on file  Food Insecurity:   . Worried About Charity fundraiser in the Last Year: Not on file  . Ran Out of Food in the Last Year: Not on file  Transportation Needs:   . Lack of Transportation (Medical): Not on file  . Lack of Transportation (Non-Medical): Not on file  Physical Activity:   . Days of Exercise per Week: Not on file  . Minutes of Exercise per Session: Not on file  Stress:   . Feeling of Stress : Not on file  Social Connections:   . Frequency of Communication with Friends and Family: Not on file  . Frequency of Social Gatherings with Friends and Family: Not on file  . Attends Religious Services: Not on file  . Active Member of Clubs or Organizations: Not on file  . Attends  Club or Organization Meetings: Not on file  . Marital Status: Not on file     Family History: The patient's family history includes AAA (abdominal aortic aneurysm) in her brother; Hypertension in her mother; Prostate cancer in her father. ROS:   Please see the history of present illness.    All 14 point review of systems negative except as described per history of present illness  EKGs/Labs/Other Studies Reviewed:    EKG reviewed showed normal sinus rhythm, normal P interval, normal QS complex duration morphology  Recent Labs: No results found for requested labs within last 8760 hours.  Recent Lipid Panel No results found for: CHOL, TRIG, HDL, CHOLHDL, VLDL, LDLCALC, LDLDIRECT  Physical Exam:    VS:  BP 126/82   Pulse 89   Ht 5\' 4"  (1.626 m)   Wt  210 lb (95.3 kg)   SpO2 97%   BMI 36.05 kg/m     Wt Readings from Last 3 Encounters:  03/23/19 210 lb (95.3 kg)  03/23/18 210 lb 6.4 oz (95.4 kg)  06/10/17 214 lb (97.1 kg)     GEN:  Well nourished, well developed in no acute distress HEENT: Normal NECK: No JVD; No carotid bruits LYMPHATICS: No lymphadenopathy CARDIAC: RRR, no murmurs, no rubs, no gallops RESPIRATORY:  Clear to auscultation without rales, wheezing or rhonchi  ABDOMEN: Soft, non-tender, non-distended MUSCULOSKELETAL:  No edema; No deformity  SKIN: Warm and dry LOWER EXTREMITIES: no swelling NEUROLOGIC:  Alert and oriented x 3 PSYCHIATRIC:  Normal affect   ASSESSMENT:    1. Essential hypertension   2. Hypothyroidism, unspecified type   3. Mixed hyperlipidemia   4. Atypical chest pain   5. Borderline diabetes    PLAN:    In order of problems listed above:  1. Essential hypertension blood pressure well controlled I will continue present management. 2. Hypothyroidism diabetes followed by primary care physician.  I reviewed her K PN dated today TSH was 1.060 on August of last year.  We will continue present management. 3. Mixed dyslipidemia.  Again I reviewed K PN her LDL last time was 126 it was in March 2020.  I will schedule her to have her fasting lipid profile done.  She is not taking any statin but if her repeat her cholesterol will be still elevated and statin will be initiated. 4. Borderline diabetes to me followed by primary care physician APN showed a A1c of 5.9 which was in October 16, 2018 I encouraged her to stick with good diet as well as exercises.   Medication Adjustments/Labs and Tests Ordered: Current medicines are reviewed at length with the patient today.  Concerns regarding medicines are outlined above.  No orders of the defined types were placed in this encounter.  Medication changes: No orders of the defined types were placed in this encounter.   Signed, Park Liter, MD,  [facility] 03/23/2019 2:06 PM    [place]

## 2019-03-23 NOTE — Patient Instructions (Signed)
Medication Instructions:  Your physician recommends that you continue on your current medications as directed. Please refer to the Current Medication list given to you today.  *If you need a refill on your cardiac medications before your next appointment, please call your pharmacy*  Lab Work: Your physician recommends that you return for lab work in 1 week: lipids   If you have labs (blood work) drawn today and your tests are completely normal, you will receive your results only by: Marland Kitchen MyChart Message (if you have MyChart) OR . A paper copy in the mail If you have any lab test that is abnormal or we need to change your treatment, we will call you to review the results.  Testing/Procedures: None.   Follow-Up: At [facility], you and your health needs are our priority.  As part of our continuing mission to provide you with exceptional heart care, we have created designated Provider Care Teams.  These Care Teams include your primary Cardiologist (physician) and Advanced Practice Providers (APPs -  Physician Assistants and Nurse Practitioners) who all work together to provide you with the care you need, when you need it.  Your next appointment:   6 month(s)  The format for your next appointment:   In Person  Provider:   Jenne Campus, MD  Other Instructions

## 2019-03-27 DIAGNOSIS — E782 Mixed hyperlipidemia: Secondary | ICD-10-CM | POA: Diagnosis not present

## 2019-03-27 LAB — LIPID PANEL
Chol/HDL Ratio: 3.3 ratio (ref 0.0–4.4)
Cholesterol, Total: 206 mg/dL — ABNORMAL HIGH (ref 100–199)
HDL: 62 mg/dL (ref >39)
LDL Chol Calc (NIH): 114 mg/dL — ABNORMAL HIGH (ref 0–99)
Triglycerides: 172 mg/dL — ABNORMAL HIGH (ref 0–149)
VLDL Cholesterol Cal: 30 mg/dL (ref 5–40)

## 2019-03-28 ENCOUNTER — Telehealth: Payer: Self-pay | Admitting: *Deleted

## 2019-03-28 DIAGNOSIS — E782 Mixed hyperlipidemia: Secondary | ICD-10-CM

## 2019-03-28 MED ORDER — ATORVASTATIN CALCIUM 20 MG PO TABS: 20.0000 mg | ORAL_TABLET | Freq: Every day | ORAL | 3 refills | Status: DC

## 2019-03-28 MED ORDER — ATORVASTATIN CALCIUM 20 MG PO TABS: 20.0000 mg | ORAL_TABLET | Freq: Every day | ORAL | 0 refills | Status: DC

## 2019-03-28 NOTE — Telephone Encounter (Signed)
-----   Message from Park Liter, MD sent at 03/27/2019 10:12 PM EST ----- Cholesterol still elevated please start Lipitor 20 mg daily, fasting lipid profile in 6 weeks

## 2019-04-27 DIAGNOSIS — Z6836 Body mass index (BMI) 36.0-36.9, adult: Secondary | ICD-10-CM | POA: Diagnosis not present

## 2019-04-27 DIAGNOSIS — Z131 Encounter for screening for diabetes mellitus: Secondary | ICD-10-CM | POA: Diagnosis not present

## 2019-04-27 DIAGNOSIS — E038 Other specified hypothyroidism: Secondary | ICD-10-CM | POA: Diagnosis not present

## 2019-04-27 DIAGNOSIS — R7301 Impaired fasting glucose: Secondary | ICD-10-CM | POA: Diagnosis not present

## 2019-04-27 DIAGNOSIS — I10 Essential (primary) hypertension: Secondary | ICD-10-CM | POA: Diagnosis not present

## 2019-04-27 DIAGNOSIS — E785 Hyperlipidemia, unspecified: Secondary | ICD-10-CM | POA: Diagnosis not present

## 2019-04-27 DIAGNOSIS — Z79899 Other long term (current) drug therapy: Secondary | ICD-10-CM | POA: Diagnosis not present

## 2019-10-16 ENCOUNTER — Encounter: Payer: Self-pay | Admitting: Cardiology

## 2019-10-16 ENCOUNTER — Ambulatory Visit (INDEPENDENT_AMBULATORY_CARE_PROVIDER_SITE_OTHER): Payer: Medicare Other | Admitting: Cardiology

## 2019-10-16 ENCOUNTER — Other Ambulatory Visit: Payer: Self-pay

## 2019-10-16 VITALS — BP 148/72 | HR 84 | Ht 64.0 in | Wt 210.0 lb

## 2019-10-16 DIAGNOSIS — R7303 Prediabetes: Secondary | ICD-10-CM

## 2019-10-16 DIAGNOSIS — R0789 Other chest pain: Secondary | ICD-10-CM | POA: Diagnosis not present

## 2019-10-16 DIAGNOSIS — I10 Essential (primary) hypertension: Secondary | ICD-10-CM

## 2019-10-16 MED ORDER — PRAVASTATIN SODIUM 20 MG PO TABS: 20.0000 mg | ORAL_TABLET | Freq: Every evening | ORAL | 1 refills | Status: DC

## 2019-10-16 NOTE — Progress Notes (Signed)
Cardiology Office Note:    Date:  10/16/2019   ID:  Mackenzie Lewis, DOB February 07, 1936, MRN [id number]  PCP:  Ernestene Kiel, MD  Cardiologist:  Jenne Campus, MD    Referring MD: Glendale Chard, MD   No chief complaint on file. I am doing fine but have multiple family members with diet recently  History of Present Illness:    Mackenzie Lewis is a 84 y.o. female past medical history significant for multiple risk factors for coronary artery disease namely hypertension, borderline diabetes, dyslipidemia today follow-up for atypical chest pain.  She was given proton pump inhibitor and pain completely subsided.  Now she is doing very well.  She is devastated because she got multiple family members who died.  Denies have any chest pain tightness squeezing pressure burning chest.  Past Medical History:  Diagnosis Date  . Hypertension   . Thyroid disease     Past Surgical History:  Procedure Laterality Date  . LAPAROSCOPIC APPENDECTOMY N/A 04/10/2016   Procedure: APPENDECTOMY LAPAROSCOPIC;  Surgeon: Coralie Keens, MD;  Location: [place];  Service: General;  Laterality: N/A;    Current Medications: Current Meds  Medication Sig  . aspirin 81 MG EC tablet Take 81 mg by mouth daily. Swallow whole.  . Cholecalciferol (VITAMIN D3) 5000 units CAPS Take 1 capsule by mouth daily.  Marland Kitchen KRILL OIL PO Take 1 capsule by mouth daily.   Marland Kitchen levothyroxine (SYNTHROID, LEVOTHROID) 75 MCG tablet Take 75 mcg by mouth daily before breakfast.   . Olmesartan-Amlodipine-HCTZ (TRIBENZOR) 40-5-12.5 MG TABS Take 1 tablet by mouth daily.  . pantoprazole (PROTONIX) 40 MG tablet Take 40 mg by mouth daily.  . Probiotic Product (PROBIOTIC DAILY PO) Take by mouth.     Allergies:   Sulfa antibiotics   Social History   Socioeconomic History  . Marital status: Divorced    Spouse name: Not on file  . Number of children: Not on file  . Years of education: Not on file  . Highest education level: Not on file    Occupational History  . Not on file  Tobacco Use  . Smoking status: Never Smoker  . Smokeless tobacco: Never Used  Vaping Use  . Vaping Use: Never used  Substance and Sexual Activity  . Alcohol use: No    Alcohol/week: 0.0 standard drinks  . Drug use: No  . Sexual activity: Not on file  Other Topics Concern  . Not on file  Social History Narrative  . Not on file   Social Determinants of Health   Financial Resource Strain:   . Difficulty of Paying Living Expenses: Not on file  Food Insecurity:   . Worried About Charity fundraiser in the Last Year: Not on file  . Ran Out of Food in the Last Year: Not on file  Transportation Needs:   . Lack of Transportation (Medical): Not on file  . Lack of Transportation (Non-Medical): Not on file  Physical Activity:   . Days of Exercise per Week: Not on file  . Minutes of Exercise per Session: Not on file  Stress:   . Feeling of Stress : Not on file  Social Connections:   . Frequency of Communication with Friends and Family: Not on file  . Frequency of Social Gatherings with Friends and Family: Not on file  . Attends Religious Services: Not on file  . Active Member of Clubs or Organizations: Not on file  . Attends Archivist Meetings: Not on file  .  Marital Status: Not on file     Family History: The patient's family history includes AAA (abdominal aortic aneurysm) in her brother; Hypertension in her mother; Prostate cancer in her father. ROS:   Please see the history of present illness.    All 14 point review of systems negative except as described per history of present illness  EKGs/Labs/Other Studies Reviewed:      Recent Labs: No results found for requested labs within last 8760 hours.  Recent Lipid Panel    Component Value Date/Time   CHOL 206 (H) 03/27/2019 0901   TRIG 172 (H) 03/27/2019 0901   HDL 62 03/27/2019 0901   CHOLHDL 3.3 03/27/2019 0901   LDLCALC 114 (H) 03/27/2019 0901    Physical Exam:     VS:  BP (!) 148/72   Pulse 84   Ht 5\' 4"  (1.626 m)   Wt 210 lb (95.3 kg)   SpO2 94%   BMI 36.05 kg/m     Wt Readings from Last 3 Encounters:  10/16/19 210 lb (95.3 kg)  03/23/19 210 lb (95.3 kg)  03/23/18 210 lb 6.4 oz (95.4 kg)     GEN:  Well nourished, well developed in no acute distress HEENT: Normal NECK: No JVD; No carotid bruits LYMPHATICS: No lymphadenopathy CARDIAC: RRR, no murmurs, no rubs, no gallops RESPIRATORY:  Clear to auscultation without rales, wheezing or rhonchi  ABDOMEN: Soft, non-tender, non-distended MUSCULOSKELETAL:  No edema; No deformity  SKIN: Warm and dry LOWER EXTREMITIES: no swelling NEUROLOGIC:  Alert and oriented x 3 PSYCHIATRIC:  Normal affect   ASSESSMENT:    1. Essential hypertension   2. Borderline diabetes   3. Atypical chest pain    PLAN:    In order of problems listed above:  1. Essential hypertension blood pressure seems to be well controlled today we will continue present management. 2. Borderline diabetes: Her last hemoglobin A1c from March is 5.8.  That being followed by internal medicine team we did talk about good diet and exercises which he understand. 3. Atypical chest pain denies having any since she was put on proton pump inhibitor. 4. Dyslipidemia: Intolerant to Lipitor.  She agreed to try pravastatin 20 mg which we will do.   Medication Adjustments/Labs and Tests Ordered: Current medicines are reviewed at length with the patient today.  Concerns regarding medicines are outlined above.  No orders of the defined types were placed in this encounter.  Medication changes: No orders of the defined types were placed in this encounter.   Signed, Park Liter, MD, [facility] 10/16/2019 1:29 PM    [place]

## 2019-10-16 NOTE — Addendum Note (Signed)
Addended by: Ashok Norris on: 10/16/2019 01:36 PM   Modules accepted: Orders

## 2019-10-16 NOTE — Patient Instructions (Signed)
Medication Instructions:  Your physician has recommended you make the following change in your medication:   START: Pravastatin 20 mg daily    *If you need a refill on your cardiac medications before your next appointment, please call your pharmacy*   Lab Work: None.  If you have labs (blood work) drawn today and your tests are completely normal, you will receive your results only by: Marland Kitchen MyChart Message (if you have MyChart) OR . A paper copy in the mail If you have any lab test that is abnormal or we need to change your treatment, we will call you to review the results.   Testing/Procedures: None.    Follow-Up: At [facility], you and your health needs are our priority.  As part of our continuing mission to provide you with exceptional heart care, we have created designated Provider Care Teams.  These Care Teams include your primary Cardiologist (physician) and Advanced Practice Providers (APPs -  Physician Assistants and Nurse Practitioners) who all work together to provide you with the care you need, when you need it.  We recommend signing up for the patient portal called "MyChart".  Sign up information is provided on this After Visit Summary.  MyChart is used to connect with patients for Virtual Visits (Telemedicine).  Patients are able to view lab/test results, encounter notes, upcoming appointments, etc.  Non-urgent messages can be sent to your provider as well.   To learn more about what you can do with MyChart, go to [email or web address].    Your next appointment:   5 month(s)  The format for your next appointment:   In Person  Provider:   Jenne Campus, MD   Other Instructions  Pravastatin tablets What is this medicine? PRAVASTATIN (PRA va stat in) is known as a HMG-CoA reductase inhibitor or 'statin'. It lowers the level of cholesterol and triglycerides in the blood. This drug may also reduce the risk of heart attack, stroke, or other health problems in  patients with risk factors for heart disease. Diet and lifestyle changes are often used with this drug. This medicine may be used for other purposes; ask your health care provider or pharmacist if you have questions. COMMON BRAND NAME(S): Pravachol What should I tell my health care provider before I take this medicine? They need to know if you have any of these conditions:  diabetes  if you often drink alcohol  history of stroke  kidney disease  liver disease  muscle aches or weakness  thyroid disease  an unusual or allergic reaction to pravastatin, other medicines, foods, dyes, or preservatives  pregnant or trying to get pregnant  breast-feeding How should I use this medicine? Take pravastatin tablets by mouth. Swallow the tablets with a drink of water. Pravastatin can be taken at anytime of the day, with or without food. Follow the directions on the prescription label. Take your doses at regular intervals. Do not take your medicine more often than directed. Talk to your pediatrician regarding the use of this medicine in children. Special care may be needed. Pravastatin has been used in children as young as 77 years of age. Overdosage: If you think you have taken too much of this medicine contact a poison control center or emergency room at once. NOTE: This medicine is only for you. Do not share this medicine with others. What if I miss a dose? If you miss a dose, take it as soon as you can. If it is almost time for your next dose, take  only that dose. Do not take double or extra doses. What may interact with this medicine? This medicine may interact with the following medications:  colchicine  cyclosporine  other medicines for high cholesterol  some antibiotics like azithromycin, clarithromycin, erythromycin, and telithromycin This list may not describe all possible interactions. Give your health care provider a list of all the medicines, herbs, non-prescription drugs, or  dietary supplements you use. Also tell them if you smoke, drink alcohol, or use illegal drugs. Some items may interact with your medicine. What should I watch for while using this medicine? Visit your doctor or health care professional for regular check-ups. You may need regular tests to make sure your liver is working properly. Your health care professional may tell you to stop taking this medicine if you develop muscle problems. If your muscle problems do not go away after stopping this medicine, contact your health care professional. Do not become pregnant while taking this medicine. Women should inform their health care professional if they wish to become pregnant or think they might be pregnant. There is a potential for serious side effects to an unborn child. Talk to your health care professional or pharmacist for more information. Do not breast-feed an infant while taking this medicine. This medicine may affect blood sugar levels. If you have diabetes, check with your doctor or health care professional before you change your diet or the dose of your diabetic medicine. If you are going to need surgery or other procedure, tell your doctor that you are using this medicine. This drug is only part of a total heart-health program. Your doctor or a dietician can suggest a low-cholesterol and low-fat diet to help. Avoid alcohol and smoking, and keep a proper exercise schedule. This medicine may cause a decrease in Co-Enzyme Q-10. You should make sure that you get enough Co-Enzyme Q-10 while you are taking this medicine. Discuss the foods you eat and the vitamins you take with your health care professional. What side effects may I notice from receiving this medicine? Side effects that you should report to your doctor or health care professional as soon as possible:  allergic reactions like skin rash, itching or hives, swelling of the face, lips, or tongue  dark urine  fever  muscle pain, cramps, or  weakness  redness, blistering, peeling or loosening of the skin, including inside the mouth  trouble passing urine or change in the amount of urine  unusually weak or tired  yellowing of the eyes or skin Side effects that usually do not require medical attention (report to your doctor or health care professional if they continue or are bothersome):  gas  headache  heartburn  indigestion  stomach pain This list may not describe all possible side effects. Call your doctor for medical advice about side effects. You may report side effects to FDA at 1-800-FDA-1088. Where should I keep my medicine? Keep out of the reach of children. Store at room temperature between 15 to 30 degrees C (59 to 86 degrees F). Protect from light. Keep container tightly closed. Throw away any unused medicine after the expiration date. NOTE: This sheet is a summary. It may not cover all possible information. If you have questions about this medicine, talk to your doctor, pharmacist, or health care provider.  2020 Elsevier/Gold Standard (2016-10-05 12:37:09)

## 2019-10-26 DIAGNOSIS — Z79899 Other long term (current) drug therapy: Secondary | ICD-10-CM | POA: Diagnosis not present

## 2019-10-26 DIAGNOSIS — I10 Essential (primary) hypertension: Secondary | ICD-10-CM | POA: Diagnosis not present

## 2019-10-26 DIAGNOSIS — R140 Abdominal distension (gaseous): Secondary | ICD-10-CM | POA: Diagnosis not present

## 2019-10-26 DIAGNOSIS — R7301 Impaired fasting glucose: Secondary | ICD-10-CM | POA: Diagnosis not present

## 2019-10-26 DIAGNOSIS — K219 Gastro-esophageal reflux disease without esophagitis: Secondary | ICD-10-CM | POA: Diagnosis not present

## 2019-10-26 DIAGNOSIS — E785 Hyperlipidemia, unspecified: Secondary | ICD-10-CM | POA: Diagnosis not present

## 2019-10-26 DIAGNOSIS — E038 Other specified hypothyroidism: Secondary | ICD-10-CM | POA: Diagnosis not present

## 2019-10-26 DIAGNOSIS — R413 Other amnesia: Secondary | ICD-10-CM | POA: Diagnosis not present

## 2019-10-30 DIAGNOSIS — Z23 Encounter for immunization: Secondary | ICD-10-CM | POA: Diagnosis not present

## 2020-01-01 DIAGNOSIS — Z1231 Encounter for screening mammogram for malignant neoplasm of breast: Secondary | ICD-10-CM | POA: Diagnosis not present

## 2020-01-02 DIAGNOSIS — R0600 Dyspnea, unspecified: Secondary | ICD-10-CM | POA: Diagnosis not present

## 2020-01-02 DIAGNOSIS — K219 Gastro-esophageal reflux disease without esophagitis: Secondary | ICD-10-CM | POA: Diagnosis not present

## 2020-01-02 DIAGNOSIS — R1010 Upper abdominal pain, unspecified: Secondary | ICD-10-CM | POA: Diagnosis not present

## 2020-01-02 DIAGNOSIS — R0602 Shortness of breath: Secondary | ICD-10-CM | POA: Diagnosis not present

## 2020-01-02 DIAGNOSIS — R109 Unspecified abdominal pain: Secondary | ICD-10-CM | POA: Diagnosis not present

## 2020-01-02 DIAGNOSIS — J329 Chronic sinusitis, unspecified: Secondary | ICD-10-CM | POA: Diagnosis not present

## 2020-01-02 DIAGNOSIS — Z6835 Body mass index (BMI) 35.0-35.9, adult: Secondary | ICD-10-CM | POA: Diagnosis not present

## 2020-01-08 DIAGNOSIS — Z961 Presence of intraocular lens: Secondary | ICD-10-CM | POA: Diagnosis not present

## 2020-01-08 DIAGNOSIS — H04123 Dry eye syndrome of bilateral lacrimal glands: Secondary | ICD-10-CM | POA: Diagnosis not present

## 2020-01-08 DIAGNOSIS — H01009 Unspecified blepharitis unspecified eye, unspecified eyelid: Secondary | ICD-10-CM | POA: Diagnosis not present

## 2020-03-17 DIAGNOSIS — I10 Essential (primary) hypertension: Secondary | ICD-10-CM | POA: Insufficient documentation

## 2020-03-17 DIAGNOSIS — E079 Disorder of thyroid, unspecified: Secondary | ICD-10-CM | POA: Insufficient documentation

## 2020-03-19 ENCOUNTER — Ambulatory Visit (INDEPENDENT_AMBULATORY_CARE_PROVIDER_SITE_OTHER): Payer: Medicare Other | Admitting: Cardiology

## 2020-03-19 ENCOUNTER — Encounter: Payer: Self-pay | Admitting: Cardiology

## 2020-03-19 ENCOUNTER — Other Ambulatory Visit: Payer: Self-pay

## 2020-03-19 VITALS — BP 156/74 | HR 51 | Ht 64.0 in | Wt 208.0 lb

## 2020-03-19 DIAGNOSIS — I10 Essential (primary) hypertension: Secondary | ICD-10-CM | POA: Diagnosis not present

## 2020-03-19 DIAGNOSIS — R0789 Other chest pain: Secondary | ICD-10-CM | POA: Diagnosis not present

## 2020-03-19 DIAGNOSIS — E782 Mixed hyperlipidemia: Secondary | ICD-10-CM | POA: Diagnosis not present

## 2020-03-19 NOTE — Progress Notes (Signed)
Cardiology Office Note:    Date:  03/19/2020   ID:  Mackenzie Lewis, DOB [birth year], MRN [id number]  PCP:  Ernestene Kiel, MD  Cardiologist:  Jenne Campus, MD    Referring MD: Ernestene Kiel, MD   Chief Complaint  Patient presents with  . Follow-up  I am doing much better  History of Present Illness:    Mackenzie Lewis is a 85 y.o. female with past medical history significant for essential hypertension, dyslipidemia, atypical chest pain.  She was referred to [place] because of that atypical chest pain however she was also given proton pump inhibitor would make her symptoms go away completely since that time she is doing well.  She try to be more active she try to exercise on the regular basis she does have stationary bike that she use every morning for one half an hour and no problem doing it.  Past Medical History:  Diagnosis Date  . Appendicitis with peritonitis 04/10/2016  . Atypical chest pain 09/09/2016  . Benign hypertensive renal disease 09/01/2016  . Bilateral primary osteoarthritis of knee 01/20/2016  . Borderline diabetes 09/09/2016  . CKD (chronic kidney disease), stage II 09/01/2016  . Cyclic citrullinated peptide (CCP) antibody positive 01/20/2016   Because she has positive CCP, I want to make sure we monitor the patient closely and we encouraged the patient to look for symptoms that include increased hand stiffness, swelling and redness to the MCP joint.  If that happens, she is to call us so that we can schedule her for an ultrasound to look for synovitis.    . Essential hypertension 09/09/2016  . Hyperlipidemia 09/01/2016  . Hypertension   . Hypothyroidism 09/01/2016  . Osteoarthritis of both feet 01/20/2016  . Osteoarthritis, hand 01/20/2016  . Thyroid disease     Past Surgical History:  Procedure Laterality Date  . LAPAROSCOPIC APPENDECTOMY N/A 04/10/2016   Procedure: APPENDECTOMY LAPAROSCOPIC;  Surgeon: Coralie Keens, MD;  Location: [facility];  Service: General;   Laterality: N/A;    Current Medications: Current Meds  Medication Sig  . aspirin 81 MG EC tablet Take 81 mg by mouth daily. Swallow whole.  Marland Kitchen atorvastatin (LIPITOR) 20 MG tablet Take 1 tablet (20 mg total) by mouth daily.  . Cholecalciferol (VITAMIN D3) 5000 units CAPS Take 1 capsule by mouth daily.  Marland Kitchen KRILL OIL PO Take 1 capsule by mouth daily.   Marland Kitchen levothyroxine (SYNTHROID, LEVOTHROID) 75 MCG tablet Take 75 mcg by mouth daily before breakfast.   . Olmesartan-amLODIPine-HCTZ 40-5-12.5 MG TABS Take 1 tablet by mouth daily.  . pantoprazole (PROTONIX) 40 MG tablet Take 40 mg by mouth daily.  . pravastatin (PRAVACHOL) 20 MG tablet Take 1 tablet (20 mg total) by mouth every evening.  . Probiotic Product (PROBIOTIC DAILY PO) Take by mouth.  . [DISCONTINUED] EXFORGE HCT 5-160-12.5 MG TABS Take 1 tablet by mouth daily.     Allergies:   Sulfa antibiotics   Social History   Socioeconomic History  . Marital status: Divorced    Spouse name: Not on file  . Number of children: Not on file  . Years of education: Not on file  . Highest education level: Not on file  Occupational History  . Not on file  Tobacco Use  . Smoking status: Never Smoker  . Smokeless tobacco: Never Used  Vaping Use  . Vaping Use: Never used  Substance and Sexual Activity  . Alcohol use: No    Alcohol/week: 0.0 standard drinks  . Drug  use: No  . Sexual activity: Not on file  Other Topics Concern  . Not on file  Social History Narrative  . Not on file   Social Determinants of Health   Financial Resource Strain: Not on file  Food Insecurity: Not on file  Transportation Needs: Not on file  Physical Activity: Not on file  Stress: Not on file  Social Connections: Not on file     Family History: The patient's family history includes AAA (abdominal aortic aneurysm) in her brother; Hypertension in her mother; Prostate cancer in her father. ROS:   Please see the history of present illness.    All 14 point  review of systems negative except as described per history of present illness  EKGs/Labs/Other Studies Reviewed:      Recent Labs: No results found for requested labs within last 8760 hours.  Recent Lipid Panel    Component Value Date/Time   CHOL 206 (H) 03/27/2019 0901   TRIG 172 (H) 03/27/2019 0901   HDL 62 03/27/2019 0901   CHOLHDL 3.3 03/27/2019 0901   LDLCALC 114 (H) 03/27/2019 0901    Physical Exam:    VS:  BP (!) 156/74 (BP Location: Right Arm, Patient Position: Sitting)   Pulse (!) 51   Ht 5\' 4"  (1.626 m)   Wt 208 lb (94.3 kg)   SpO2 99%   BMI 35.70 kg/m     Wt Readings from Last 3 Encounters:  03/19/20 208 lb (94.3 kg)  10/16/19 210 lb (95.3 kg)  03/23/19 210 lb (95.3 kg)     GEN:  Well nourished, well developed in no acute distress HEENT: Normal NECK: No JVD; No carotid bruits LYMPHATICS: No lymphadenopathy CARDIAC: RRR, no murmurs, no rubs, no gallops RESPIRATORY:  Clear to auscultation without rales, wheezing or rhonchi  ABDOMEN: Soft, non-tender, non-distended MUSCULOSKELETAL:  No edema; No deformity  SKIN: Warm and dry LOWER EXTREMITIES: no swelling NEUROLOGIC:  Alert and oriented x 3 PSYCHIATRIC:  Normal affect   ASSESSMENT:    1. Essential hypertension   2. Mixed hyperlipidemia   3. Atypical chest pain    PLAN:    In order of problems listed above:  1. Atypical chest pain denies having any.  Doing well from that point review on proton pump inhibitor. 2. Essential hypertension blood pressure not at target today.  I asked her to keep checking blood pressure at home and let me know what the blood pressure is she said she is probably excited that is why her blood pressure is elevated she may be right. 3. Dyslipidemia she is on statin she take Lipitor 20 which I will continue, her last K PN from March of last year showed an LDL of 112 and HDL 73.  We will recheck her fasting lipid profile today   Medication Adjustments/Labs and Tests  Ordered: Current medicines are reviewed at length with the patient today.  Concerns regarding medicines are outlined above.  No orders of the defined types were placed in this encounter.  Medication changes: No orders of the defined types were placed in this encounter.   Signed, Park Liter, MD, [facility] 03/19/2020 11:35 AM    [place]

## 2020-03-19 NOTE — Patient Instructions (Signed)
Medication Instructions:  Your physician recommends that you continue on your current medications as directed. Please refer to the Current Medication list given to you today.  *If you need a refill on your cardiac medications before your next appointment, please call your pharmacy*   Lab Work: Your physician recommends that you return for lab work today: lipid   If you have labs (blood work) drawn today and your tests are completely normal, you will receive your results only by: Marland Kitchen MyChart Message (if you have MyChart) OR . A paper copy in the mail If you have any lab test that is abnormal or we need to change your treatment, we will call you to review the results.   Testing/Procedures: [place]   Follow-Up: At [facility], you and your health needs are our priority.  As part of our continuing mission to provide you with exceptional heart care, we have created designated Provider Care Teams.  These Care Teams include your primary Cardiologist (physician) and Advanced Practice Providers (APPs -  Physician Assistants and Nurse Practitioners) who all work together to provide you with the care you need, when you need it.  We recommend signing up for the patient portal called "MyChart".  Sign up information is provided on this After Visit Summary.  MyChart is used to connect with patients for Virtual Visits (Telemedicine).  Patients are able to view lab/test results, encounter notes, upcoming appointments, etc.  Non-urgent messages can be sent to your provider as well.   To learn more about what you can do with MyChart, go to [email or web address].    Your next appointment:   6 month(s)  The format for your next appointment:   In Person  Provider:   Jenne Campus, MD   Other Instructions

## 2020-03-19 NOTE — Addendum Note (Signed)
Addended by: Senaida Ores on: 03/19/2020 11:39 AM   Modules accepted: Orders

## 2020-03-20 LAB — LIPID PANEL
Chol/HDL Ratio: 2.8 ratio (ref 0.0–4.4)
Cholesterol, Total: 181 mg/dL (ref 100–199)
HDL: 64 mg/dL (ref >39)
LDL Chol Calc (NIH): 92 mg/dL (ref 0–99)
Triglycerides: 145 mg/dL (ref 0–149)
VLDL Cholesterol Cal: 25 mg/dL (ref 5–40)

## 2020-04-01 DIAGNOSIS — Z1331 Encounter for screening for depression: Secondary | ICD-10-CM | POA: Diagnosis not present

## 2020-04-01 DIAGNOSIS — E2839 Other primary ovarian failure: Secondary | ICD-10-CM | POA: Diagnosis not present

## 2020-04-01 DIAGNOSIS — Z6835 Body mass index (BMI) 35.0-35.9, adult: Secondary | ICD-10-CM | POA: Diagnosis not present

## 2020-04-01 DIAGNOSIS — Z0000 Encounter for general adult medical examination without abnormal findings: Secondary | ICD-10-CM | POA: Diagnosis not present

## 2020-04-01 DIAGNOSIS — Z1339 Encounter for screening examination for other mental health and behavioral disorders: Secondary | ICD-10-CM | POA: Diagnosis not present

## 2020-04-07 DIAGNOSIS — M8589 Other specified disorders of bone density and structure, multiple sites: Secondary | ICD-10-CM | POA: Diagnosis not present

## 2020-04-07 DIAGNOSIS — E2839 Other primary ovarian failure: Secondary | ICD-10-CM | POA: Diagnosis not present

## 2020-04-19 ENCOUNTER — Other Ambulatory Visit: Payer: Self-pay | Admitting: Cardiology

## 2020-04-21 NOTE — Telephone Encounter (Signed)
Rx refill sent to pharmacy.  Atorvastatin discontinued per pt

## 2020-04-25 DIAGNOSIS — R7301 Impaired fasting glucose: Secondary | ICD-10-CM | POA: Diagnosis not present

## 2020-04-25 DIAGNOSIS — E785 Hyperlipidemia, unspecified: Secondary | ICD-10-CM | POA: Diagnosis not present

## 2020-04-25 DIAGNOSIS — E038 Other specified hypothyroidism: Secondary | ICD-10-CM | POA: Diagnosis not present

## 2020-04-25 DIAGNOSIS — I10 Essential (primary) hypertension: Secondary | ICD-10-CM | POA: Diagnosis not present

## 2020-04-25 DIAGNOSIS — K219 Gastro-esophageal reflux disease without esophagitis: Secondary | ICD-10-CM | POA: Diagnosis not present

## 2020-04-25 DIAGNOSIS — Z79899 Other long term (current) drug therapy: Secondary | ICD-10-CM | POA: Diagnosis not present

## 2020-05-19 DIAGNOSIS — J0100 Acute maxillary sinusitis, unspecified: Secondary | ICD-10-CM | POA: Diagnosis not present

## 2020-09-01 DIAGNOSIS — H81399 Other peripheral vertigo, unspecified ear: Secondary | ICD-10-CM | POA: Diagnosis not present

## 2020-09-01 DIAGNOSIS — J0100 Acute maxillary sinusitis, unspecified: Secondary | ICD-10-CM | POA: Diagnosis not present

## 2020-09-01 DIAGNOSIS — H6990 Unspecified Eustachian tube disorder, unspecified ear: Secondary | ICD-10-CM | POA: Diagnosis not present

## 2020-09-01 DIAGNOSIS — R5383 Other fatigue: Secondary | ICD-10-CM | POA: Diagnosis not present

## 2020-09-01 DIAGNOSIS — Z20828 Contact with and (suspected) exposure to other viral communicable diseases: Secondary | ICD-10-CM | POA: Diagnosis not present

## 2020-10-17 ENCOUNTER — Other Ambulatory Visit: Payer: Self-pay | Admitting: Cardiology

## 2020-10-27 DIAGNOSIS — Z79899 Other long term (current) drug therapy: Secondary | ICD-10-CM | POA: Diagnosis not present

## 2020-10-27 DIAGNOSIS — G479 Sleep disorder, unspecified: Secondary | ICD-10-CM | POA: Diagnosis not present

## 2020-10-27 DIAGNOSIS — F419 Anxiety disorder, unspecified: Secondary | ICD-10-CM | POA: Diagnosis not present

## 2020-10-27 DIAGNOSIS — I10 Essential (primary) hypertension: Secondary | ICD-10-CM | POA: Diagnosis not present

## 2020-10-27 DIAGNOSIS — E063 Autoimmune thyroiditis: Secondary | ICD-10-CM | POA: Diagnosis not present

## 2020-10-27 DIAGNOSIS — E785 Hyperlipidemia, unspecified: Secondary | ICD-10-CM | POA: Diagnosis not present

## 2020-10-27 DIAGNOSIS — K5900 Constipation, unspecified: Secondary | ICD-10-CM | POA: Diagnosis not present

## 2020-10-27 DIAGNOSIS — R7303 Prediabetes: Secondary | ICD-10-CM | POA: Diagnosis not present

## 2020-11-25 DIAGNOSIS — M545 Low back pain, unspecified: Secondary | ICD-10-CM | POA: Diagnosis not present

## 2020-12-10 ENCOUNTER — Other Ambulatory Visit: Payer: Self-pay

## 2020-12-11 ENCOUNTER — Ambulatory Visit (INDEPENDENT_AMBULATORY_CARE_PROVIDER_SITE_OTHER): Payer: Medicare Other | Admitting: Cardiology

## 2020-12-11 ENCOUNTER — Other Ambulatory Visit: Payer: Self-pay

## 2020-12-11 ENCOUNTER — Encounter: Payer: Self-pay | Admitting: Cardiology

## 2020-12-11 VITALS — BP 134/66 | HR 87 | Ht 64.0 in | Wt 199.4 lb

## 2020-12-11 DIAGNOSIS — I129 Hypertensive chronic kidney disease with stage 1 through stage 4 chronic kidney disease, or unspecified chronic kidney disease: Secondary | ICD-10-CM | POA: Diagnosis not present

## 2020-12-11 DIAGNOSIS — I10 Essential (primary) hypertension: Secondary | ICD-10-CM | POA: Diagnosis not present

## 2020-12-11 DIAGNOSIS — R7303 Prediabetes: Secondary | ICD-10-CM

## 2020-12-11 DIAGNOSIS — R0789 Other chest pain: Secondary | ICD-10-CM

## 2020-12-11 DIAGNOSIS — E782 Mixed hyperlipidemia: Secondary | ICD-10-CM | POA: Diagnosis not present

## 2020-12-11 DIAGNOSIS — E039 Hypothyroidism, unspecified: Secondary | ICD-10-CM | POA: Diagnosis not present

## 2020-12-11 NOTE — Progress Notes (Signed)
Cardiology Office Note:    Date:  12/11/2020   ID:  Mackenzie Lewis, DOB 01-Sep-1935, MRN [id number]  PCP:  Mackenzie Kiel, MD  Cardiologist:  Mackenzie Campus, MD    Referring MD: Mackenzie Kiel, MD   Chief Complaint  Patient presents with   Follow-up    HR elevated    History of Present Illness:    Mackenzie Lewis is a 85 y.o. female with past medical history significant for essential hypertension, atypical chest pain she comes for regular follow-up.  She noted her heart rate being elevated and she is very concerned about it.  There is no chest pain tightness squeezing pressure burning chest she does have some exertional shortness of breath but she is obese.  She lost some weight however.  He also got dyslipidemia and she is unhappy because apparently her cholesterol being rechecked which show slightly worsening results.  Past Medical History:  Diagnosis Date   Appendicitis with peritonitis 04/10/2016   Atypical chest pain 09/09/2016   Benign hypertensive renal disease 09/01/2016   Bilateral primary osteoarthritis of knee 01/20/2016   Borderline diabetes 09/09/2016   CKD (chronic kidney disease), stage II 05/24/8117   Cyclic citrullinated peptide (CCP) antibody positive 01/20/2016   Because she has positive CCP, I want to make sure we monitor the patient closely and we encouraged the patient to look for symptoms that include increased hand stiffness, swelling and redness to the MCP joint.  If that happens, she is to call us so that we can schedule her for an ultrasound to look for synovitis.     Essential hypertension 09/09/2016   Hyperlipidemia 09/01/2016   Hypertension    Hypothyroidism 09/01/2016   Osteoarthritis of both feet 01/20/2016   Osteoarthritis, hand 01/20/2016   Thyroid disease     Past Surgical History:  Procedure Laterality Date   LAPAROSCOPIC APPENDECTOMY N/A 04/10/2016   Procedure: APPENDECTOMY LAPAROSCOPIC;  Surgeon: Mackenzie Keens, MD;  Location: [facility];   Service: General;  Laterality: N/A;    Current Medications: Current Meds  Medication Sig   aspirin 81 MG EC tablet Take 81 mg by mouth daily. Swallow whole.   Calcium Carbonate (CALCIUM 600 PO) Take 1 tablet by mouth daily.   Cholecalciferol (VITAMIN D3) 5000 units CAPS Take 1 capsule by mouth daily.   KRILL OIL PO Take 1 capsule by mouth daily. Unknown strenght   levothyroxine (SYNTHROID, LEVOTHROID) 75 MCG tablet Take 75 mcg by mouth daily before breakfast.    Olmesartan-amLODIPine-HCTZ 40-5-12.5 MG TABS Take 1 tablet by mouth daily.   pantoprazole (PROTONIX) 40 MG tablet Take 40 mg by mouth daily.   pravastatin (PRAVACHOL) 20 MG tablet TAKE 1 TABLET BY MOUTH EVERY EVENING (Patient taking differently: Take 20 mg by mouth daily.)   Probiotic Product (PROBIOTIC DAILY PO) Take 1 tablet by mouth daily.     Allergies:   Sulfa antibiotics   Social History   Socioeconomic History   Marital status: Divorced    Spouse name: Not on file   Number of children: Not on file   Years of education: Not on file   Highest education level: Not on file  Occupational History   Not on file  Tobacco Use   Smoking status: Never   Smokeless tobacco: Never  Vaping Use   Vaping Use: Never used  Substance and Sexual Activity   Alcohol use: No    Alcohol/week: 0.0 standard drinks   Drug use: No   Sexual activity: Not on file  Other Topics Concern   Not on file  Social History Narrative   Not on file   Social Determinants of Health   Financial Resource Strain: Not on file  Food Insecurity: Not on file  Transportation Needs: Not on file  Physical Activity: Not on file  Stress: Not on file  Social Connections: Not on file     Family History: The patient's family history includes AAA (abdominal aortic aneurysm) in her brother; Hypertension in her mother; Prostate cancer in her father. ROS:   Please see the history of present illness.    All 14 point review of systems negative except as  described per history of present illness  EKGs/Labs/Other Studies Reviewed:      Recent Labs: No results found for requested labs within last 8760 hours.  Recent Lipid Panel    Component Value Date/Time   CHOL 181 03/19/2020 1148   TRIG 145 03/19/2020 1148   HDL 64 03/19/2020 1148   CHOLHDL 2.8 03/19/2020 1148   LDLCALC 92 03/19/2020 1148    Physical Exam:    VS:  BP 134/66 (BP Location: Left Arm, Patient Position: Sitting)   Pulse 87   Ht 5\' 4"  (1.626 m)   Wt 199 lb 6.4 oz (90.4 kg)   SpO2 95%   BMI 34.23 kg/m     Wt Readings from Last 3 Encounters:  12/11/20 199 lb 6.4 oz (90.4 kg)  03/19/20 208 lb (94.3 kg)  10/16/19 210 lb (95.3 kg)     GEN:  Well nourished, well developed in no acute distress HEENT: Normal NECK: No JVD; No carotid bruits LYMPHATICS: No lymphadenopathy CARDIAC: RRR, no murmurs, no rubs, no gallops RESPIRATORY:  Clear to auscultation without rales, wheezing or rhonchi  ABDOMEN: Soft, non-tender, non-distended MUSCULOSKELETAL:  No edema; No deformity  SKIN: Warm and dry LOWER EXTREMITIES: no swelling NEUROLOGIC:  Alert and oriented x 3 PSYCHIATRIC:  Normal affect   ASSESSMENT:    1. Essential hypertension   2. Acquired hypothyroidism   3. Benign hypertensive renal disease   4. Mixed hyperlipidemia   5. Borderline diabetes    PLAN:    In order of problems listed above:  Palpitations/tachycardia.  She noted persistent elevation of heart rate.  We will do EKG today we will schedule her to have echocardiogram to assess left ventricle ejection fraction. Hypothyroidism.  I did review her K PN which show me her TSH is 1.63 this is from October 27, 2020 is a good number. Dyslipidemia I did review K PN which show me HDL of 66 from September 12 but they do not have LDL.  We will call primary care physician to get copy of it. Borderline diabetes last hemoglobin A1c from September 5 0.8 continue present management.   Medication  Adjustments/Labs and Tests Ordered: Current medicines are reviewed at length with the patient today.  Concerns regarding medicines are outlined above.  No orders of the defined types were placed in this encounter.  Medication changes: No orders of the defined types were placed in this encounter.   Signed, Park Liter, MD, [facility] 12/11/2020 1:41 PM    [place]

## 2020-12-11 NOTE — Patient Instructions (Signed)
Medication Instructions:  Your physician recommends that you continue on your current medications as directed. Please refer to the Current Medication list given to you today.  *If you need a refill on your cardiac medications before your next appointment, please call your pharmacy*   Lab Work: None If you have labs (blood work) drawn today and your tests are completely normal, you will receive your results only by: [place] (if you have MyChart) OR A paper copy in the mail If you have any lab test that is abnormal or we need to change your treatment, we will call you to review the results.   Testing/Procedures: Your physician has requested that you have an echocardiogram. Echocardiography is a painless test that uses sound waves to create images of your heart. It provides your doctor with information about the size and shape of your heart and how well your heart's chambers and valves are working. This procedure takes approximately one hour. There are no restrictions for this procedure.    Follow-Up: At [facility], you and your health needs are our priority.  As part of our continuing mission to provide you with exceptional heart care, we have created designated Provider Care Teams.  These Care Teams include your primary Cardiologist (physician) and Advanced Practice Providers (APPs -  Physician Assistants and Nurse Practitioners) who all work together to provide you with the care you need, when you need it.  We recommend signing up for the patient portal called "MyChart".  Sign up information is provided on this After Visit Summary.  MyChart is used to connect with patients for Virtual Visits (Telemedicine).  Patients are able to view lab/test results, encounter notes, upcoming appointments, etc.  Non-urgent messages can be sent to your provider as well.   To learn more about what you can do with MyChart, go to [email or web address].    Your next appointment:   6  month(s)  The format for your next appointment:   In Person  Provider:   Jenne Campus, MD   Other Instructions  Echocardiogram An echocardiogram is a test that uses sound waves (ultrasound) to produce images of the heart. Images from an echocardiogram can provide important information about: Heart size and shape. The size and thickness and movement of your heart's walls. Heart muscle function and strength. Heart valve function or if you have stenosis. Stenosis is when the heart valves are too narrow. If blood is flowing backward through the heart valves (regurgitation). A tumor or infectious growth around the heart valves. Areas of heart muscle that are not working well because of poor blood flow or injury from a heart attack. Aneurysm detection. An aneurysm is a weak or damaged part of an artery wall. The wall bulges out from the normal force of blood pumping through the body. Tell a health care provider about: Any allergies you have. All medicines you are taking, including vitamins, herbs, eye drops, creams, and over-the-counter medicines. Any blood disorders you have. Any surgeries you have had. Any medical conditions you have. Whether you are pregnant or may be pregnant. What are the risks? Generally, this is a safe test. However, problems may occur, including an allergic reaction to dye (contrast) that may be used during the test. What happens before the test? No specific preparation is needed. You may eat and drink normally. What happens during the test?  You will take off your clothes from the waist up and put on a hospital gown. Electrodes or electrocardiogram (ECG)patches may be placed  on your chest. The electrodes or patches are then connected to a device that monitors your heart rate and rhythm. You will lie down on a table for an ultrasound exam. A gel will be applied to your chest to help sound waves pass through your skin. A handheld device, called a  transducer, will be pressed against your chest and moved over your heart. The transducer produces sound waves that travel to your heart and bounce back (or "echo" back) to the transducer. These sound waves will be captured in real-time and changed into images of your heart that can be viewed on a video monitor. The images will be recorded on a computer and reviewed by your health care provider. You may be asked to change positions or hold your breath for a short time. This makes it easier to get different views or better views of your heart. In some cases, you may receive contrast through an IV in one of your veins. This can improve the quality of the pictures from your heart. The procedure may vary among health care providers and hospitals. What can I expect after the test? You may return to your normal, everyday life, including diet, activities, and medicines, unless your health care provider tells you not to do that. Follow these instructions at home: It is up to you to get the results of your test. Ask your health care provider, or the department that is doing the test, when your results will be ready. Keep all follow-up visits. This is important. Summary An echocardiogram is a test that uses sound waves (ultrasound) to produce images of the heart. Images from an echocardiogram can provide important information about the size and shape of your heart, heart muscle function, heart valve function, and other possible heart problems. You do not need to do anything to prepare before this test. You may eat and drink normally. After the echocardiogram is completed, you may return to your normal, everyday life, unless your health care provider tells you not to do that. This information is not intended to replace advice given to you by your health care provider. Make sure you discuss any questions you have with your health care provider. Document Revised: 09/25/2019 Document Reviewed: 09/25/2019 Elsevier  Patient Education  2022 [employer].

## 2020-12-24 ENCOUNTER — Ambulatory Visit (INDEPENDENT_AMBULATORY_CARE_PROVIDER_SITE_OTHER): Payer: Medicare Other

## 2020-12-24 ENCOUNTER — Other Ambulatory Visit: Payer: Self-pay

## 2020-12-24 DIAGNOSIS — R7303 Prediabetes: Secondary | ICD-10-CM | POA: Diagnosis not present

## 2020-12-24 DIAGNOSIS — R0789 Other chest pain: Secondary | ICD-10-CM | POA: Diagnosis not present

## 2020-12-24 DIAGNOSIS — E039 Hypothyroidism, unspecified: Secondary | ICD-10-CM | POA: Diagnosis not present

## 2020-12-24 DIAGNOSIS — E782 Mixed hyperlipidemia: Secondary | ICD-10-CM

## 2020-12-24 DIAGNOSIS — I10 Essential (primary) hypertension: Secondary | ICD-10-CM

## 2020-12-24 DIAGNOSIS — I129 Hypertensive chronic kidney disease with stage 1 through stage 4 chronic kidney disease, or unspecified chronic kidney disease: Secondary | ICD-10-CM

## 2020-12-24 LAB — ECHOCARDIOGRAM COMPLETE
AR max vel: 1.49 cm2
AV Area VTI: 1.55 cm2
AV Area mean vel: 1.41 cm2
AV Mean grad: 9 mmHg
AV Peak grad: 16.3 mmHg
Ao pk vel: 2.02 m/s
Area-P 1/2: 5.34 cm2
S' Lateral: 2.7 cm

## 2021-01-14 DIAGNOSIS — H26101 Unspecified traumatic cataract, right eye: Secondary | ICD-10-CM | POA: Diagnosis not present

## 2021-01-14 DIAGNOSIS — H40003 Preglaucoma, unspecified, bilateral: Secondary | ICD-10-CM | POA: Diagnosis not present

## 2021-01-14 DIAGNOSIS — Z961 Presence of intraocular lens: Secondary | ICD-10-CM | POA: Diagnosis not present

## 2021-01-16 DIAGNOSIS — Z1231 Encounter for screening mammogram for malignant neoplasm of breast: Secondary | ICD-10-CM | POA: Diagnosis not present

## 2021-01-20 ENCOUNTER — Telehealth: Payer: Self-pay | Admitting: Cardiology

## 2021-01-20 MED ORDER — PRAVASTATIN SODIUM 20 MG PO TABS: 20.0000 mg | ORAL_TABLET | Freq: Every evening | ORAL | 0 refills | Status: DC

## 2021-01-20 NOTE — Telephone Encounter (Signed)
*  STAT* If patient is at the pharmacy, call can be transferred to refill team.   1. Which medications need to be refilled? (please list name of each medication and dose if known) pravastatin (PRAVACHOL) 20 MG tablet  2. Which pharmacy/location (including street and city if local pharmacy) is medication to be sent to? [place], [place]  3. Do they need a 30 day or 90 day supply? [place]

## 2021-01-20 NOTE — Telephone Encounter (Signed)
Medication refilled

## 2021-02-12 DIAGNOSIS — R051 Acute cough: Secondary | ICD-10-CM | POA: Diagnosis not present

## 2021-02-12 DIAGNOSIS — Z20828 Contact with and (suspected) exposure to other viral communicable diseases: Secondary | ICD-10-CM | POA: Diagnosis not present

## 2021-02-12 DIAGNOSIS — M791 Myalgia, unspecified site: Secondary | ICD-10-CM | POA: Diagnosis not present

## 2021-02-12 DIAGNOSIS — J019 Acute sinusitis, unspecified: Secondary | ICD-10-CM | POA: Diagnosis not present

## 2021-03-03 DIAGNOSIS — Z6834 Body mass index (BMI) 34.0-34.9, adult: Secondary | ICD-10-CM | POA: Diagnosis not present

## 2021-03-03 DIAGNOSIS — L309 Dermatitis, unspecified: Secondary | ICD-10-CM | POA: Diagnosis not present

## 2021-03-03 DIAGNOSIS — R5383 Other fatigue: Secondary | ICD-10-CM | POA: Diagnosis not present

## 2021-03-31 ENCOUNTER — Other Ambulatory Visit: Payer: Self-pay | Admitting: Cardiology

## 2021-04-03 DIAGNOSIS — R1013 Epigastric pain: Secondary | ICD-10-CM | POA: Diagnosis not present

## 2021-04-03 DIAGNOSIS — Z79899 Other long term (current) drug therapy: Secondary | ICD-10-CM | POA: Diagnosis not present

## 2021-04-03 DIAGNOSIS — Z1211 Encounter for screening for malignant neoplasm of colon: Secondary | ICD-10-CM | POA: Diagnosis not present

## 2021-04-03 DIAGNOSIS — R42 Dizziness and giddiness: Secondary | ICD-10-CM | POA: Diagnosis not present

## 2021-04-03 DIAGNOSIS — J329 Chronic sinusitis, unspecified: Secondary | ICD-10-CM | POA: Diagnosis not present

## 2021-04-03 DIAGNOSIS — R209 Unspecified disturbances of skin sensation: Secondary | ICD-10-CM | POA: Diagnosis not present

## 2021-04-03 DIAGNOSIS — E785 Hyperlipidemia, unspecified: Secondary | ICD-10-CM | POA: Diagnosis not present

## 2021-04-03 DIAGNOSIS — Z0000 Encounter for general adult medical examination without abnormal findings: Secondary | ICD-10-CM | POA: Diagnosis not present

## 2021-04-03 DIAGNOSIS — I10 Essential (primary) hypertension: Secondary | ICD-10-CM | POA: Diagnosis not present

## 2021-04-03 DIAGNOSIS — E038 Other specified hypothyroidism: Secondary | ICD-10-CM | POA: Diagnosis not present

## 2021-04-03 DIAGNOSIS — Z6833 Body mass index (BMI) 33.0-33.9, adult: Secondary | ICD-10-CM | POA: Diagnosis not present

## 2021-04-03 DIAGNOSIS — Z8709 Personal history of other diseases of the respiratory system: Secondary | ICD-10-CM | POA: Diagnosis not present

## 2021-04-03 DIAGNOSIS — Z1331 Encounter for screening for depression: Secondary | ICD-10-CM | POA: Diagnosis not present

## 2021-04-07 DIAGNOSIS — Z1211 Encounter for screening for malignant neoplasm of colon: Secondary | ICD-10-CM | POA: Diagnosis not present

## 2021-04-27 ENCOUNTER — Encounter: Payer: Self-pay | Admitting: Podiatry

## 2021-04-27 ENCOUNTER — Ambulatory Visit (INDEPENDENT_AMBULATORY_CARE_PROVIDER_SITE_OTHER): Payer: Medicare Other

## 2021-04-27 ENCOUNTER — Ambulatory Visit (INDEPENDENT_AMBULATORY_CARE_PROVIDER_SITE_OTHER): Payer: Medicare Other | Admitting: Podiatry

## 2021-04-27 ENCOUNTER — Other Ambulatory Visit: Payer: Self-pay

## 2021-04-27 DIAGNOSIS — K625 Hemorrhage of anus and rectum: Secondary | ICD-10-CM | POA: Insufficient documentation

## 2021-04-27 DIAGNOSIS — M7742 Metatarsalgia, left foot: Secondary | ICD-10-CM

## 2021-04-27 DIAGNOSIS — R140 Abdominal distension (gaseous): Secondary | ICD-10-CM | POA: Insufficient documentation

## 2021-04-27 DIAGNOSIS — R142 Eructation: Secondary | ICD-10-CM | POA: Insufficient documentation

## 2021-04-27 DIAGNOSIS — M7741 Metatarsalgia, right foot: Secondary | ICD-10-CM

## 2021-04-27 DIAGNOSIS — R141 Gas pain: Secondary | ICD-10-CM | POA: Insufficient documentation

## 2021-04-27 DIAGNOSIS — R1013 Epigastric pain: Secondary | ICD-10-CM | POA: Insufficient documentation

## 2021-04-27 DIAGNOSIS — K219 Gastro-esophageal reflux disease without esophagitis: Secondary | ICD-10-CM | POA: Insufficient documentation

## 2021-04-27 DIAGNOSIS — R635 Abnormal weight gain: Secondary | ICD-10-CM | POA: Insufficient documentation

## 2021-04-27 DIAGNOSIS — K5900 Constipation, unspecified: Secondary | ICD-10-CM | POA: Insufficient documentation

## 2021-04-27 DIAGNOSIS — R194 Change in bowel habit: Secondary | ICD-10-CM | POA: Insufficient documentation

## 2021-04-27 NOTE — Progress Notes (Signed)
?  Subjective:  ?Patient ID: Mackenzie Lewis, female    DOB: 18-Mar-1935,   MRN: [id number] ? ?No chief complaint on file. ? ? ?86 y.o. female presents for concern of bilateral pain in feet in the ball. Relates this has been going on for over a year. Relates she normally wears birkenstocks and does well but when she wears 'cute' shoes she has a lot of pain and feels like she walks on rocks . Denies any other pedal complaints. Denies n/v/f/c.  ? ?Past Medical History:  ?Diagnosis Date  ? Appendicitis with peritonitis 04/10/2016  ? Atypical chest pain 09/09/2016  ? Benign hypertensive renal disease 09/01/2016  ? Bilateral primary osteoarthritis of knee 01/20/2016  ? Borderline diabetes 09/09/2016  ? CKD (chronic kidney disease), stage II 09/01/2016  ? Cyclic citrullinated peptide (CCP) antibody positive 01/20/2016  ? Because she has positive CCP, I want to make sure we monitor the patient closely and we encouraged the patient to look for symptoms that include increased hand stiffness, swelling and redness to the MCP joint.  If that happens, she is to call us so that we can schedule her for an ultrasound to look for synovitis.    ? Essential hypertension 09/09/2016  ? Hyperlipidemia 09/01/2016  ? Hypertension   ? Hypothyroidism 09/01/2016  ? Osteoarthritis of both feet 01/20/2016  ? Osteoarthritis, hand 01/20/2016  ? Thyroid disease   ? ? ?Objective:  ?Physical Exam: ?Vascular: DP/PT pulses 2/4 bilateral. CFT <3 seconds. Normal hair growth on digits. No edema.  ?Skin. No lacerations or abrasions bilateral feet.  ?Musculoskeletal: MMT 5/5 bilateral lower extremities in DF, PF, Inversion and Eversion. Deceased ROM in DF of ankle joint. Minimally tender to plantar metatarsal heads although they are prominent and fat pad atrophy noted.  ?Neurological: Sensation intact to light touch.  ? ?Assessment:  ? ?1. Metatarsalgia of both feet   ? ? ? ?Plan:  ?Patient was evaluated and treated and all questions answered. ?Discussed metatarsalgia and  fat pad atrophy and treatment options with patient.  ?Radiographs reviewed and discussed with patient.  ?Discussed padding and offloading today.  ?Anti-inflammatories as needed.  ?Patient to return as needed.  ? ? ?Lorenda Peck, DPM  ? ? ?

## 2021-05-07 DIAGNOSIS — Z20822 Contact with and (suspected) exposure to covid-19: Secondary | ICD-10-CM | POA: Diagnosis not present

## 2021-05-12 DIAGNOSIS — Z20828 Contact with and (suspected) exposure to other viral communicable diseases: Secondary | ICD-10-CM | POA: Diagnosis not present

## 2021-06-05 DIAGNOSIS — Z20822 Contact with and (suspected) exposure to covid-19: Secondary | ICD-10-CM | POA: Diagnosis not present

## 2021-06-11 ENCOUNTER — Encounter: Payer: Self-pay | Admitting: Cardiology

## 2021-06-11 ENCOUNTER — Ambulatory Visit (INDEPENDENT_AMBULATORY_CARE_PROVIDER_SITE_OTHER): Payer: Medicare Other | Admitting: Cardiology

## 2021-06-11 VITALS — BP 130/82 | HR 95 | Ht 64.0 in | Wt 198.4 lb

## 2021-06-11 DIAGNOSIS — E782 Mixed hyperlipidemia: Secondary | ICD-10-CM | POA: Diagnosis not present

## 2021-06-11 DIAGNOSIS — R7303 Prediabetes: Secondary | ICD-10-CM | POA: Diagnosis not present

## 2021-06-11 DIAGNOSIS — I10 Essential (primary) hypertension: Secondary | ICD-10-CM | POA: Diagnosis not present

## 2021-06-11 MED ORDER — PRAVASTATIN SODIUM 20 MG PO TABS: 20.0000 mg | ORAL_TABLET | Freq: Every evening | ORAL | 3 refills | Status: DC

## 2021-06-11 NOTE — Patient Instructions (Signed)

## 2021-06-11 NOTE — Progress Notes (Signed)
?Cardiology Office Note:   ? ?Date:  06/11/2021  ? ?ID:  Mackenzie Lewis, DOB August 14, 1935, MRN [id number] ? ?PCP:  Ernestene Kiel, MD  ?Cardiologist:  Jenne Campus, MD   ? ?Referring MD: Ernestene Kiel, MD  ? ?Chief Complaint  ?Patient presents with  ? Follow-up  ?Doing very well ? ?History of Present Illness:   ? ?Mackenzie Lewis is a 86 y.o. female with past medical history significant for essential hypertension, chronic kidney failure, dyslipidemia initially sent to Mackenzie Lewis because of epigastric discomfort.  She was given further primary.  Complete subsided symptomatology.  Last time I seen her she was complaining of having palpitations, she is supposed to wear a monitor but she never did doing quite well right now doing well denies have any chest pain tightness squeezing pressure burning chest no palpitations dizziness swelling of lower extremities. ? ?Past Medical History:  ?Diagnosis Date  ? Appendicitis with peritonitis 04/10/2016  ? Atypical chest pain 09/09/2016  ? Benign hypertensive renal disease 09/01/2016  ? Bilateral primary osteoarthritis of knee 01/20/2016  ? Borderline diabetes 09/09/2016  ? CKD (chronic kidney disease), stage II 09/01/2016  ? Cyclic citrullinated peptide (CCP) antibody positive 01/20/2016  ? Because she has positive CCP, I want to make sure we monitor the patient closely and we encouraged the patient to look for symptoms that include increased hand stiffness, swelling and redness to the MCP joint.  If that happens, she is to call Mackenzie Lewis so that we can schedule her for an ultrasound to look for synovitis.    ? Essential hypertension 09/09/2016  ? Hyperlipidemia 09/01/2016  ? Hypertension   ? Hypothyroidism 09/01/2016  ? Osteoarthritis of both feet 01/20/2016  ? Osteoarthritis, hand 01/20/2016  ? Thyroid disease   ? ? ?Past Surgical History:  ?Procedure Laterality Date  ? LAPAROSCOPIC APPENDECTOMY N/A 04/10/2016  ? Procedure: APPENDECTOMY LAPAROSCOPIC;  Surgeon: Coralie Keens, MD;  Location: [facility];  Service: General;  Laterality: N/A;  ? ? ?Current Medications: ?Current Meds  ?Medication Sig  ? aspirin 81 MG EC tablet Take 81 mg by mouth daily. Swallow whole.  ? Calcium Carbonate (CALCIUM 600 PO) Take 1 tablet by mouth daily.  ? Cholecalciferol (VITAMIN D3) 5000 units CAPS Take 1 capsule by mouth daily.  ? EXFORGE HCT 5-160-12.5 MG TABS Take 1 tablet by mouth daily.  ? KRILL OIL PO Take 1 capsule by mouth daily. Unknown strenght  ? levothyroxine (SYNTHROID, LEVOTHROID) 75 MCG tablet Take 75 mcg by mouth daily before breakfast.   ? Olmesartan-amLODIPine-HCTZ 40-5-12.5 MG TABS Take 1 tablet by mouth daily.  ? pravastatin (PRAVACHOL) 20 MG tablet TAKE 1 TABLET BY MOUTH EVERY EVENING (Patient taking differently: Take 20 mg by mouth daily.)  ? Probiotic Product (PROBIOTIC DAILY PO) Take 1 tablet by mouth daily.  ?  ? ?Allergies:   Sulfa antibiotics  ? ?Social History  ? ?Socioeconomic History  ? Marital status: Divorced  ?  Spouse name: Not on file  ? Number of children: Not on file  ? Years of education: Not on file  ? Highest education level: Not on file  ?Occupational History  ? Not on file  ?Tobacco Use  ? Smoking status: Never  ? Smokeless tobacco: Never  ?Vaping Use  ? Vaping Use: Never used  ?Substance and Sexual Activity  ? Alcohol use: No  ?  Alcohol/week: 0.0 standard drinks  ? Drug use: No  ? Sexual activity: Not on file  ?Other Topics Concern  ?  Not on file  ?Social History Narrative  ? Not on file  ? ?Social Determinants of Health  ? ?Financial Resource Strain: Not on file  ?Food Insecurity: Not on file  ?Transportation Needs: Not on file  ?Physical Activity: Not on file  ?Stress: Not on file  ?Social Connections: Not on file  ?  ? ?Family History: ?The patient's family history includes AAA (abdominal aortic aneurysm) in her brother; Hypertension in her mother; Prostate cancer in her father. ?ROS:   ?Please see the history of present illness.    ?All 14 point review of systems negative except as  described per history of present illness ? ?EKGs/Labs/Other Studies Reviewed:   ? ? ? ?Recent Labs: ?No results found for requested labs within last 8760 hours.  ?Recent Lipid Panel ?   ?Component Value Date/Time  ? CHOL 181 03/19/2020 1148  ? TRIG 145 03/19/2020 1148  ? HDL 64 03/19/2020 1148  ? CHOLHDL 2.8 03/19/2020 1148  ? [place] 92 03/19/2020 1148  ? ? ?Physical Exam:   ? ?VS:  BP 130/82 (BP Location: Left Arm, Patient Position: Sitting)   Pulse 95   Ht '5\' 4"'$  (1.626 m)   Wt 198 lb 6.4 oz (90 kg)   SpO2 96%   BMI 34.06 kg/m?    ? ?Wt Readings from Last 3 Encounters:  ?06/11/21 198 lb 6.4 oz (90 kg)  ?12/11/20 199 lb 6.4 oz (90.4 kg)  ?03/19/20 208 lb (94.3 kg)  ?  ? ?GEN:  Well nourished, well developed in no acute distress ?HEENT: Normal ?NECK: No JVD; No carotid bruits ?LYMPHATICS: No lymphadenopathy ?CARDIAC: RRR, no murmurs, no rubs, no gallops ?RESPIRATORY:  Clear to auscultation without rales, wheezing or rhonchi  ?ABDOMEN: Soft, non-tender, non-distended ?MUSCULOSKELETAL:  No edema; No deformity  ?SKIN: Warm and dry ?LOWER EXTREMITIES: no swelling ?NEUROLOGIC:  Alert and oriented x 3 ?PSYCHIATRIC:  Normal affect  ? ?ASSESSMENT:   ? ?1. Essential hypertension   ?2. Borderline diabetes   ?3. Mixed hyperlipidemia   ? ?PLAN:   ? ?In order of problems listed above: ? ?Essential hypertension blood pressure controlled continue present management. ?Dyslipidemia I did review K PN done by primary care physician LDL 89 HDL 66 this is from April 03, 2021 she is on pravastatin which I will continue. ?Borderline diabetes I did review K PN which show me her hemoglobin A1c of 5.8 continue present management. ?We did talk about risk factors modification need to exercise on the regular basis which she is trying to do and good diet. ? ? ?Medication Adjustments/Labs and Tests Ordered: ?Current medicines are reviewed at length with the patient today.  Concerns regarding medicines are outlined above.  ?No orders of  the defined types were placed in this encounter. ? ?Medication changes: No orders of the defined types were placed in this encounter. ? ? ?Signed, ?Park Liter, MD, [facility] ?06/11/2021 12:52 PM    ?[place] ?

## 2021-06-20 DIAGNOSIS — Z20822 Contact with and (suspected) exposure to covid-19: Secondary | ICD-10-CM | POA: Diagnosis not present

## 2021-06-22 DIAGNOSIS — Z6833 Body mass index (BMI) 33.0-33.9, adult: Secondary | ICD-10-CM | POA: Diagnosis not present

## 2021-06-22 DIAGNOSIS — R1013 Epigastric pain: Secondary | ICD-10-CM | POA: Diagnosis not present

## 2021-06-29 DIAGNOSIS — R1013 Epigastric pain: Secondary | ICD-10-CM | POA: Diagnosis not present

## 2021-06-29 DIAGNOSIS — K7689 Other specified diseases of liver: Secondary | ICD-10-CM | POA: Diagnosis not present

## 2021-06-29 DIAGNOSIS — K828 Other specified diseases of gallbladder: Secondary | ICD-10-CM | POA: Diagnosis not present

## 2021-07-20 DIAGNOSIS — M47816 Spondylosis without myelopathy or radiculopathy, lumbar region: Secondary | ICD-10-CM | POA: Diagnosis not present

## 2021-07-20 DIAGNOSIS — M5136 Other intervertebral disc degeneration, lumbar region: Secondary | ICD-10-CM | POA: Diagnosis not present

## 2021-07-20 DIAGNOSIS — R600 Localized edema: Secondary | ICD-10-CM | POA: Diagnosis not present

## 2021-07-20 DIAGNOSIS — R935 Abnormal findings on diagnostic imaging of other abdominal regions, including retroperitoneum: Secondary | ICD-10-CM | POA: Diagnosis not present

## 2021-07-20 DIAGNOSIS — K3189 Other diseases of stomach and duodenum: Secondary | ICD-10-CM | POA: Diagnosis not present

## 2021-07-20 DIAGNOSIS — K838 Other specified diseases of biliary tract: Secondary | ICD-10-CM | POA: Diagnosis not present

## 2021-07-20 DIAGNOSIS — R932 Abnormal findings on diagnostic imaging of liver and biliary tract: Secondary | ICD-10-CM | POA: Diagnosis not present

## 2021-07-29 DIAGNOSIS — J324 Chronic pansinusitis: Secondary | ICD-10-CM | POA: Diagnosis not present

## 2021-08-10 DIAGNOSIS — R1310 Dysphagia, unspecified: Secondary | ICD-10-CM | POA: Diagnosis not present

## 2021-08-10 DIAGNOSIS — R112 Nausea with vomiting, unspecified: Secondary | ICD-10-CM | POA: Diagnosis not present

## 2021-08-10 DIAGNOSIS — R634 Abnormal weight loss: Secondary | ICD-10-CM | POA: Diagnosis not present

## 2021-08-10 DIAGNOSIS — R935 Abnormal findings on diagnostic imaging of other abdominal regions, including retroperitoneum: Secondary | ICD-10-CM | POA: Diagnosis not present

## 2021-08-10 DIAGNOSIS — K219 Gastro-esophageal reflux disease without esophagitis: Secondary | ICD-10-CM | POA: Diagnosis not present

## 2021-08-21 DIAGNOSIS — K219 Gastro-esophageal reflux disease without esophagitis: Secondary | ICD-10-CM | POA: Diagnosis not present

## 2021-08-21 DIAGNOSIS — R634 Abnormal weight loss: Secondary | ICD-10-CM | POA: Diagnosis not present

## 2021-08-21 DIAGNOSIS — R978 Other abnormal tumor markers: Secondary | ICD-10-CM | POA: Diagnosis not present

## 2021-08-21 DIAGNOSIS — R935 Abnormal findings on diagnostic imaging of other abdominal regions, including retroperitoneum: Secondary | ICD-10-CM | POA: Diagnosis not present

## 2021-08-21 DIAGNOSIS — C161 Malignant neoplasm of fundus of stomach: Secondary | ICD-10-CM | POA: Diagnosis not present

## 2021-08-21 DIAGNOSIS — C169 Malignant neoplasm of stomach, unspecified: Secondary | ICD-10-CM | POA: Diagnosis not present

## 2021-08-21 DIAGNOSIS — R1310 Dysphagia, unspecified: Secondary | ICD-10-CM | POA: Diagnosis not present

## 2021-08-21 DIAGNOSIS — K259 Gastric ulcer, unspecified as acute or chronic, without hemorrhage or perforation: Secondary | ICD-10-CM | POA: Diagnosis not present

## 2021-08-21 DIAGNOSIS — D371 Neoplasm of uncertain behavior of stomach: Secondary | ICD-10-CM | POA: Diagnosis not present

## 2021-08-21 DIAGNOSIS — R112 Nausea with vomiting, unspecified: Secondary | ICD-10-CM | POA: Diagnosis not present

## 2021-08-21 DIAGNOSIS — C163 Malignant neoplasm of pyloric antrum: Secondary | ICD-10-CM | POA: Diagnosis not present

## 2021-08-28 ENCOUNTER — Telehealth: Payer: Self-pay | Admitting: Oncology

## 2021-08-28 NOTE — Telephone Encounter (Signed)
Scheduled appt per 7/13 referral. Pt is aware of appt date and time. Pt is aware to arrive 15 mins prior to appt time and to bring and updated insurance card. Pt is aware of appt location.   

## 2021-09-02 DIAGNOSIS — C168 Malignant neoplasm of overlapping sites of stomach: Secondary | ICD-10-CM | POA: Diagnosis not present

## 2021-09-02 DIAGNOSIS — E440 Moderate protein-calorie malnutrition: Secondary | ICD-10-CM | POA: Diagnosis not present

## 2021-09-02 DIAGNOSIS — E038 Other specified hypothyroidism: Secondary | ICD-10-CM | POA: Diagnosis not present

## 2021-09-02 DIAGNOSIS — I10 Essential (primary) hypertension: Secondary | ICD-10-CM | POA: Diagnosis not present

## 2021-09-10 ENCOUNTER — Other Ambulatory Visit: Payer: Self-pay | Admitting: Oncology

## 2021-09-10 ENCOUNTER — Inpatient Hospital Stay (INDEPENDENT_AMBULATORY_CARE_PROVIDER_SITE_OTHER): Payer: Medicare Other | Admitting: Oncology

## 2021-09-10 ENCOUNTER — Encounter: Payer: Self-pay | Admitting: Oncology

## 2021-09-10 ENCOUNTER — Inpatient Hospital Stay: Payer: Medicare Other | Attending: Oncology

## 2021-09-10 DIAGNOSIS — C168 Malignant neoplasm of overlapping sites of stomach: Secondary | ICD-10-CM

## 2021-09-10 DIAGNOSIS — C169 Malignant neoplasm of stomach, unspecified: Secondary | ICD-10-CM | POA: Diagnosis not present

## 2021-09-10 DIAGNOSIS — Z79899 Other long term (current) drug therapy: Secondary | ICD-10-CM | POA: Insufficient documentation

## 2021-09-10 HISTORY — DX: Malignant neoplasm of stomach, unspecified: C16.9

## 2021-09-10 LAB — HEPATIC FUNCTION PANEL
ALT: 21 U/L (ref 7–35)
AST: 26 (ref 13–35)
Alkaline Phosphatase: 81 (ref 25–125)
Bilirubin, Total: 0.6

## 2021-09-10 LAB — CANCER ANTIGEN 19-9
CA 19-9: 7
CEA: 2.8

## 2021-09-10 LAB — CBC AND DIFFERENTIAL
HCT: 45 (ref 36–46)
Hemoglobin: 15.2 (ref 12.0–16.0)
Neutrophils Absolute: 8.96
Platelets: 214 10*3/uL (ref 150–400)
WBC: 12.8

## 2021-09-10 LAB — CBC: RBC: 5.25 — AB (ref 3.87–5.11)

## 2021-09-10 LAB — COMPREHENSIVE METABOLIC PANEL
Albumin: 4.5 (ref 3.5–5.0)
Calcium: 9.6 (ref 8.7–10.7)

## 2021-09-10 LAB — BASIC METABOLIC PANEL
BUN: 21 (ref 4–21)
CO2: 24 — AB (ref 13–22)
Chloride: 103 (ref 99–108)
Creatinine: 0.8 (ref 0.5–1.1)
Glucose: 135
Potassium: 3.6 mEq/L (ref 3.5–5.1)
Sodium: 136 — AB (ref 137–147)

## 2021-09-11 LAB — CEA: CEA: 2.9 ng/mL (ref 0.0–4.7)

## 2021-09-11 NOTE — Progress Notes (Signed)
[facility]  [address] [place],  [place]  [address] [phone]  Clinic Day:  09/10/21  Referring physician: Kyra Leyland, MD   ASSESSMENT & PLAN:   Gastric adenocarcinoma This is a poorly differentiated adenocarcinoma with signet ring features and ulceration.  I would consider it as metastatic to the omentum and so that would stage this as a T4 N0 M1, stage IVB.  However I think a PET scan would help to more accurately stage this and assess any other possible sites of disease.  I have discussed the fact that she will need systemic treatment with chemotherapy and recommend XELOX, i.e. oral Xeloda with oxaliplatin IV.  I believe she will also benefit from the addition of immunotherapy to improve the success rate and we will make sure that this is approved for her.  I have recommended guardant testing of the tumor as well as of her blood to see if we have any other targetable mutations.  She will have a formal chemotherapy education session prior to starting treatment.  Stain for HER2 was negative at 0.    I will have her scheduled for a PET scan for complete staging and let her know about those results.  In the meantime we will also proceed with having a port placed by Dr. Amalia Hailey and formulate a treatment plan with XELOX and likely nivolumab.  I will arrange for guardant testing of the tumor as well as liquid.  I have recommended she increase her omeprazole to twice daily.  I will have the dietitian meet with her and advised her on dietary interventions that may help.  We hope to get started fairly soon, will likely in early August.  She will have a chemotherapy education session next week prior to starting and antiemetics will be prescribed.  I have outlined this plan and diagnosis and written it down for the patient and her family as well as the most commonly expected toxicities and schedule.  Testing for HER2 was negative, but we will check PD-L1 as  well.  I also discussed that she may be a candidate for genetic testing with 2 first-degree relatives with breast cancer and her advanced gastric cancer as well as her father with prostate cancer.  We will therefore make her a referral to the genetics counselor for their opinion.  I discussed the assessment and treatment plan with the patient.  The patient was provided an opportunity to ask questions and all were answered.  The patient agreed with the plan and demonstrated an understanding of the instructions.  The patient was advised to call back if the symptoms worsen or if the condition fails to improve as anticipated.  Thank you for the opportunity to participate in the care of your patients.  I provided 60 minutes of face-to-face time during this this encounter and > 50% was spent counseling as documented under my assessment and plan.    Derwood Kaplan, MD [facility] [address] [place] [address] Dept: [phone] Dept Fax: [phone]   CHIEF COMPLAINT:  CC: Gastric cancer  Current Treatment: Chemotherapy/immunotherapy   HISTORY OF PRESENT ILLNESS:  Mackenzie Lewis is a 86 y.o. female with a history of gastric cancer who is referred in consultation with Dr. Sandria Senter for assessment and management.  She had noticed that she was having regurgitation when eating and had lost over 30 pounds.  An ultrasound was done, revealing hepatic  steatosis, and led to an MRI scan on June 5 which revealed gastric wall thickening with confluent nodularity of the omentum anterior to the stomach measuring 3.5 cm consistent with metastatic tumor.  She also had low-grade edema and wall thickening extending into the duodenum from the stomach.  She was referred to Dr. Sandria Senter and he did an EGD on July 7.  This revealed a large ulceration measuring 1.2 cm along the greater curvature.  She also had diffusely edematous and  erythematous wall with erosions of the antrum and stiff and friable mucosa with oozing of blood.  These findings extended to the gastric fundus as well.  Pathology revealed a poorly differentiated adenocarcinoma with signet ring features from the biopsies of the ulcer as well as the antrum and the fundus of the stomach.  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  She was placed on omeprazole.  Her test for H. pylori was negative.  She was referred to Dr. Kendell Bane for consideration of surgery but he felt this was not resectable because of the extensive involvement and I agree.  I would consider this extending to the duodenum and also metastatic to the omentum.  We will plan to get a PET scan to confirm these findings but I think she needs systemic intravenous therapy.  She continues to eat and has adjusted her diet to softer foods and liquids and is drinking boost so she has maintained her weight.  She does have some Zofran ODT for nausea when needed.  She has had some anorexia, nausea, occasional vomiting, early satiety, dysphagia, and belching.  A CEA and CA 19-9 were normal.  INTERVAL HISTORY:  I have reviewed her chart and materials related to her cancer extensively and collaborated history with the patient. Summary of oncologic history is as follows: Oncology History  Gastric cancer ([facility])  09/10/2021 Initial Diagnosis   Gastric cancer ([facility])   09/10/2021 Cancer Staging   Staging form: Stomach, AJCC 8th Edition - Clinical stage from 09/10/2021: Stage IVB (cT4b, cN0, cM1) - Signed by Derwood Kaplan, MD on 09/10/2021 Histopathologic type: Adenocarcinoma, NOS Stage prefix: Initial diagnosis Total positive nodes: 0 Histologic grade (G): G3 Histologic grading system: 3 grade system Sites of metastasis: Peritoneal surface Diagnostic confirmation: Positive histology PLUS positive immunophenotyping and/or positive genetic studies Specimen type:  Endoscopy with Biopsy Staged by: Managing physician Carcinoembryonic antigen (CEA) (ng/mL): 2.8 Carbohydrate antigen 19-9 (CA 19-9) (U/mL): 4.9 HER2 status: Unknown Microsatellite instability (MSI): Unknown Tumor location in stomach: Other Clinical staging modalities: Biopsy, Endoscopy Stage used in treatment planning: Yes National guidelines used in treatment planning: Yes Type of national guideline used in treatment planning: NCCN     Reality is seen in the clinic for follow up of her gastric cancer.  Her son, daughter and daughter-in-law are all here with her today and very involved with her care.  They did have some questions about diet and I will ask our dietitian to contact her and assist her with these issues.  Normally Dr. Collene Mares in [place] does her colonoscopies and her last EGD was done in October 2020.  She appears quite healthy for her age and is active, exercising and working in the yard.  She does have yearly mammograms.  CBC reveals a mild leukocytosis with a white count of 12.8 but normal differential.  CMP reveals a nonfasting blood sugar 135 and BUN of 21 but the rest is normal.  She has been able to  stabilize her weight at this time and she does have some degree of constipation so takes MiraLAX fairly regularly.  I recommended she go to daily.  I also recommended that she increase her omeprazole to twice daily as we prepare to start treatment.  She tells me she is on a medication at bedtime to help her sleep but did not know the name of this medication, but I will try to find out.  She does have Zofran ODT for nausea as needed.  She denies fever, chills, night sweats, or other signs of infection. She denies cardiorespiratory and gastrointestinal issues. She  denies pain. Her appetite is good. Her weight has decreased by over 30 pounds.  She did take hormonal replacement therapy for 10 to 15 years after her hysterectomy.  HISTORY:   Past Medical History:  Diagnosis Date    Appendicitis with peritonitis 04/10/2016   Atypical chest pain 09/09/2016   Benign hypertensive renal disease 09/01/2016   Bilateral primary osteoarthritis of knee 01/20/2016   Borderline diabetes 09/09/2016   CKD (chronic kidney disease), stage II 02/16/5850   Cyclic citrullinated peptide (CCP) antibody positive 01/20/2016   Because she has positive CCP, I want to make sure we monitor the patient closely and we encouraged the patient to look for symptoms that include increased hand stiffness, swelling and redness to the MCP joint.  If that happens, she is to call us so that we can schedule her for an ultrasound to look for synovitis.     Essential hypertension 09/09/2016   Gastric cancer ([facility]) 09/10/2021   Hyperlipidemia 09/01/2016   Hypertension    Hypothyroidism 09/01/2016   Osteoarthritis of both feet 01/20/2016   Osteoarthritis, hand 01/20/2016   Thyroid disease   Degenerative disc disease History of endometriosis  Past Surgical History:  Procedure Laterality Date   APPENDECTOMY     LAPAROSCOPIC APPENDECTOMY N/A 04/10/2016   Procedure: APPENDECTOMY LAPAROSCOPIC;  Surgeon: Coralie Keens, MD;  Location: [facility];  Service: General;  Laterality: N/A;  Bilateral tubal ligation Total hysterectomy and bilateral salpingo-oophorectomy in 1980  Family History  Problem Relation Age of Onset   Hypertension Mother    Prostate cancer Father    AAA (abdominal aortic aneurysm) Brother   Her sister has had breast cancer as well as a tumor of her head Her daughter has had breast cancer last year  Social History:  reports that she has never smoked. She has never used smokeless tobacco. She reports that she does not drink alcohol and does not use drugs.The patient is accompanied by her son, daughter-in-law and daughter today.  She is single but lives with a significant other.  She has the 2 children.  She worked in [employer] (medical) and denies any chemical or toxin exposures.  She grew up in [place] and [place].  She is  active and healthy, especially for her age.  Allergies:  Allergies  Allergen Reactions   Sulfa Antibiotics Rash    Current Medications: Current Outpatient Medications  Medication Sig Dispense Refill   aspirin 81 MG EC tablet Take 81 mg by mouth daily. Swallow whole.     Calcium Carbonate (CALCIUM 600 PO) Take 1 tablet by mouth daily.     Cholecalciferol (VITAMIN D3) 5000 units CAPS Take 1 capsule by mouth daily.     EXFORGE HCT 5-160-12.5 MG TABS Take 1 tablet by mouth daily.     famotidine (PEPCID) 40 MG tablet Take 40 mg by mouth at bedtime.     KRILL OIL PO  Take 1 capsule by mouth daily. Unknown strenght     levothyroxine (SYNTHROID, LEVOTHROID) 75 MCG tablet Take 75 mcg by mouth daily before breakfast.      omeprazole (PRILOSEC) 40 MG capsule Take 40 mg by mouth daily.     ondansetron (ZOFRAN) 4 MG tablet Take 4 mg by mouth every 4 (four) hours as needed.     pravastatin (PRAVACHOL) 20 MG tablet Take 1 tablet (20 mg total) by mouth every evening. 90 tablet 3   Probiotic Product (PROBIOTIC DAILY PO) Take 1 tablet by mouth daily.     No current facility-administered medications for this visit.    REVIEW OF SYSTEMS:  Review of Systems  Constitutional:  Positive for appetite change and unexpected weight change.  HENT:   Positive for trouble swallowing.   Eyes: Negative.   Respiratory: Negative.    Cardiovascular: Negative.   Gastrointestinal:  Positive for constipation and nausea.       She does have some dysphagia and regurgitation with certain foods.  She has to eat very slowly.  Genitourinary: Negative.    Musculoskeletal: Negative.   Skin: Negative.   Neurological: Negative.   Hematological: Negative.   Psychiatric/Behavioral: Negative.       VITALS:  Height 5' 4" (1.626 m), weight 187 lb 1.6 oz (84.9 kg).  Wt Readings from Last 3 Encounters:  09/10/21 187 lb 1.6 oz (84.9 kg)  06/11/21 198 lb 6.4 oz (90 kg)  12/11/20 199 lb 6.4 oz (90.4 kg)    Body mass index is  32.12 kg/m.  Performance status (ECOG): 1 - Symptomatic but completely ambulatory  PHYSICAL EXAM:  Physical Exam Constitutional:      Appearance: Normal appearance.  HENT:     Head: Normocephalic and atraumatic.     Nose: Nose normal.     Mouth/Throat:     Pharynx: Oropharynx is clear.  Eyes:     Extraocular Movements: Extraocular movements intact.     Conjunctiva/sclera: Conjunctivae normal.     Pupils: Pupils are equal, round, and reactive to light.  Cardiovascular:     Rate and Rhythm: Normal rate and regular rhythm.     Heart sounds: Normal heart sounds.  Pulmonary:     Effort: Pulmonary effort is normal.     Breath sounds: Normal breath sounds.  Abdominal:     General: Bowel sounds are normal.     Palpations: Abdomen is soft. There is mass.     Comments: She has a long mass effect in the epigastric area measuring at least 12 to 15 cm long and hard to palpation  Musculoskeletal:        General: Normal range of motion.     Cervical back: Normal range of motion and neck supple.  Skin:    General: Skin is warm and dry.  Neurological:     General: No focal deficit present.     Mental Status: She is alert and oriented to person, place, and time.  Psychiatric:        Mood and Affect: Mood normal.        Behavior: Behavior normal.        Thought Content: Thought content normal.        Judgment: Judgment normal.    LABS:      Latest Ref Rng & Units 09/10/2021   12:00 AM 04/12/2016    2:48 AM 04/11/2016    4:48 AM  CBC  WBC  12.8     11.6  14.0  Hemoglobin 12.0 - 16.0 15.2     12.7  13.6   Hematocrit 36 - 46 45     37.1  40.1   Platelets 150 - 400 K/uL 214     160  178      This result is from an external source.      Latest Ref Rng & Units 09/10/2021   12:00 AM 04/12/2016    2:48 AM 04/11/2016    4:48 AM  CMP  Glucose 65 - 99 mg/dL  135  137   BUN 4 - _0 Creatinine 0.5 - 1.1 0.8     1.01  0.92   Sodium 137 - 147 136     138  138   Potassium 3.5  - 5.1 mEq/L 3.6     3.9  4.1   Chloride 99 - 108 103     104  102   CO2 13 - _1 Calcium 8.7 - 10.7 9.6     8.6  9.2   Alkaline Phos 25 - 125 81        AST 13 - 35 26        ALT 7 - 35 U/L 21           This result is from an external source.     Lab Results  Component Value Date   CEA1 2.9 09/10/2021   /  CEA  Date Value Ref Range Status  09/10/2021 2.9 0.0 - 4.7 ng/mL Final    Comment:    (NOTE)                             Nonsmokers          <3.9                             Smokers             <5.6 Roche [employer] Electrochemiluminescence Immunoassay (ECLIA) Values obtained with different assay methods or kits cannot be used interchangeably.  Results cannot be interpreted as absolute evidence of the presence or absence of malignant disease. Performed At: [facility] [place], [place] [id number] Rush Farmer MD YO:[id number]    No results found for: "PSA1" No results found for: "[id number]" No results found for: "CAN125"  No results found for: "TOTALPROTELP", "ALBUMINELP", "A1GS", "A2GS", "BETS", "BETA2SER", "GAMS", "MSPIKE", "SPEI" No results found for: "TIBC", "FERRITIN", "IRONPCTSAT" No results found for: "LDH"  STUDIES:  No results found.

## 2021-09-14 ENCOUNTER — Other Ambulatory Visit: Payer: Self-pay

## 2021-09-14 ENCOUNTER — Telehealth: Payer: Self-pay

## 2021-09-14 DIAGNOSIS — C168 Malignant neoplasm of overlapping sites of stomach: Secondary | ICD-10-CM

## 2021-09-14 NOTE — Telephone Encounter (Signed)
Referral sent to Genetics/

## 2021-09-14 NOTE — Telephone Encounter (Signed)
-----   Message from Derwood Kaplan, MD sent at 09/11/2021  6:59 PM EDT ----- Regarding: note Note is done

## 2021-09-14 NOTE — Telephone Encounter (Signed)
Printed

## 2021-09-14 NOTE — Telephone Encounter (Signed)
-----   Message from Derwood Kaplan, MD sent at 09/11/2021  6:51 PM EDT ----- Regarding: genetics She has advanced gastric cancer, daughter with breast and sister with breast. Father with prostate.  I think she and/or daughter may be appropriate for genetic testing.  Let's make a referral to  Genetics clinic for their opinion

## 2021-09-15 ENCOUNTER — Ambulatory Visit: Payer: Medicare Other | Admitting: Oncology

## 2021-09-15 ENCOUNTER — Other Ambulatory Visit: Payer: Medicare Other

## 2021-09-16 ENCOUNTER — Telehealth: Payer: Self-pay

## 2021-09-16 DIAGNOSIS — E440 Moderate protein-calorie malnutrition: Secondary | ICD-10-CM | POA: Diagnosis not present

## 2021-09-16 DIAGNOSIS — E038 Other specified hypothyroidism: Secondary | ICD-10-CM | POA: Diagnosis not present

## 2021-09-16 DIAGNOSIS — I10 Essential (primary) hypertension: Secondary | ICD-10-CM | POA: Diagnosis not present

## 2021-09-16 DIAGNOSIS — C168 Malignant neoplasm of overlapping sites of stomach: Secondary | ICD-10-CM | POA: Diagnosis not present

## 2021-09-16 NOTE — Telephone Encounter (Signed)
-----   Message from Wyline Copas sent at 09/14/2021  1:06 PM EDT ----- Regarding: RE: guardant Have you called this pt? ----- Message ----- From: Belva Chimes, LPN Sent: 0/17/5102  12:03 PM EDT To: Wyline Copas Subject: FW: guardant                                    ----- Message ----- From: Derwood Kaplan, MD Sent: 09/11/2021   6:17 PM EDT To: Belva Chimes, LPN Subject: guardant                                       We need to do Guardant testing.  Request thru Shirle[facility] Mylar for the path but would also like to do the liquid so she will need to drop by to have that drawn.  Tell her I think we will be able to add the immunotherapy to her chemo to improve the success but this test will help [place] to determine that.

## 2021-09-17 ENCOUNTER — Encounter (HOSPITAL_COMMUNITY)
Admission: RE | Admit: 2021-09-17 | Discharge: 2021-09-17 | Disposition: A | Discharge status: Home / Self Care | Payer: Medicare Other | Source: Ambulatory Visit | Attending: Oncology | Admitting: Oncology

## 2021-09-17 ENCOUNTER — Other Ambulatory Visit: Payer: Self-pay | Admitting: Oncology

## 2021-09-17 DIAGNOSIS — C168 Malignant neoplasm of overlapping sites of stomach: Secondary | ICD-10-CM | POA: Insufficient documentation

## 2021-09-17 LAB — GLUCOSE, CAPILLARY: Glucose-Capillary: 124 mg/dL — ABNORMAL HIGH (ref 70–99)

## 2021-09-17 MED ORDER — FLUDEOXYGLUCOSE F - 18 (FDG) INJECTION
9.8000 | Freq: Once | INTRAVENOUS | Status: AC
  Administered 2021-09-17: 9.2 via INTRAVENOUS

## 2021-09-18 ENCOUNTER — Other Ambulatory Visit: Payer: Self-pay | Admitting: Oncology

## 2021-09-18 ENCOUNTER — Inpatient Hospital Stay: Payer: Medicare Other

## 2021-09-18 ENCOUNTER — Inpatient Hospital Stay: Payer: Medicare Other | Attending: Hematology and Oncology | Admitting: Hematology and Oncology

## 2021-09-18 ENCOUNTER — Encounter: Payer: Self-pay | Admitting: Hematology and Oncology

## 2021-09-18 VITALS — BP 159/72 | HR 79 | Temp 98.3°F | Resp 20 | Ht 64.0 in | Wt 188.9 lb

## 2021-09-18 DIAGNOSIS — Z79899 Other long term (current) drug therapy: Secondary | ICD-10-CM | POA: Insufficient documentation

## 2021-09-18 DIAGNOSIS — C168 Malignant neoplasm of overlapping sites of stomach: Secondary | ICD-10-CM

## 2021-09-18 DIAGNOSIS — C169 Malignant neoplasm of stomach, unspecified: Secondary | ICD-10-CM | POA: Insufficient documentation

## 2021-09-18 DIAGNOSIS — Z5111 Encounter for antineoplastic chemotherapy: Secondary | ICD-10-CM | POA: Insufficient documentation

## 2021-09-18 MED ORDER — PROCHLORPERAZINE MALEATE 10 MG PO TABS: 10.0000 mg | ORAL_TABLET | Freq: Four times a day (QID) | ORAL | 3 refills | Status: DC | PRN

## 2021-09-18 MED ORDER — ONDANSETRON 4 MG PO TBDP: 4.0000 mg | ORAL_TABLET | Freq: Three times a day (TID) | ORAL | 0 refills | Status: DC | PRN

## 2021-09-18 NOTE — Progress Notes (Signed)
[place] NOTE Patient Care Team: Ernestene Kiel, MD as PCP - General (Internal Medicine)   Name of the patient: Mackenzie Lewis  [id number]  1936-02-12   Date of visit: 09/18/21  Diagnosis- Gastric Cancer  Chief complaint/Reason for visit- Initial Meeting for [facility], preparing for starting chemotherapy   Heme/Onc history:  Oncology History  Gastric cancer ([place])  09/10/2021 Initial Diagnosis   Gastric cancer ([place])   09/10/2021 Cancer Staging   Staging form: Stomach, AJCC 8th Edition - Clinical stage from 09/10/2021: Stage IVB (cT4b, cN0, cM1) - Signed by Derwood Kaplan, MD on 09/10/2021 Histopathologic type: Adenocarcinoma, NOS Stage prefix: Initial diagnosis Total positive nodes: 0 Histologic grade (G): G3 Histologic grading system: 3 grade system Sites of metastasis: Peritoneal surface Diagnostic confirmation: Positive histology PLUS positive immunophenotyping and/or positive genetic studies Specimen type: Endoscopy with Biopsy Staged by: Managing physician Carcinoembryonic antigen (CEA) (ng/mL): 2.8 Carbohydrate antigen 19-9 (CA 19-9) (U/mL): 4.9 HER2 status: Unknown Microsatellite instability (MSI): Unknown Tumor location in stomach: Other Clinical staging modalities: Biopsy, Endoscopy Stage used in treatment planning: Yes National guidelines used in treatment planning: Yes Type of national guideline used in treatment planning: NCCN     Interval history-  Patient presents to chemo care clinic today for initial meeting in preparation for starting chemotherapy. I introduced the chemo care clinic and we discussed that the role of the clinic is to assist those who are at an increased risk of emergency room visits and/or complications during the course of chemotherapy treatment. We discussed that the increased risk takes into account factors such as age, performance status, and co-morbidities. We also discussed that for some, this might  include barriers to care such as not having a primary care provider, lack of insurance/transportation, or not being able to afford medications. We discussed that the goal of the program is to help prevent unplanned ER visits and help reduce complications during chemotherapy. We do this by discussing specific risk factors to each individual and identifying ways that we can help improve these risk factors and reduce barriers to care.   Allergies  Allergen Reactions   Sulfa Antibiotics Rash    Past Medical History:  Diagnosis Date   Appendicitis with peritonitis 04/10/2016   Atypical chest pain 09/09/2016   Benign hypertensive renal disease 09/01/2016   Bilateral primary osteoarthritis of knee 01/20/2016   Borderline diabetes 09/09/2016   CKD (chronic kidney disease), stage II 3/81/0175   Cyclic citrullinated peptide (CCP) antibody positive 01/20/2016   Because she has positive CCP, I want to make sure we monitor the patient closely and we encouraged the patient to look for symptoms that include increased hand stiffness, swelling and redness to the MCP joint.  If that happens, she is to call us so that we can schedule her for an ultrasound to look for synovitis.     Essential hypertension 09/09/2016   Gastric cancer ([place]) 09/10/2021   Hyperlipidemia 09/01/2016   Hypertension    Hypothyroidism 09/01/2016   Osteoarthritis of both feet 01/20/2016   Osteoarthritis, hand 01/20/2016   Thyroid disease     Past Surgical History:  Procedure Laterality Date   APPENDECTOMY     LAPAROSCOPIC APPENDECTOMY N/A 04/10/2016   Procedure: APPENDECTOMY LAPAROSCOPIC;  Surgeon: Coralie Keens, MD;  Location: [facility] OR;  Service: General;  Laterality: N/A;    Social History   Socioeconomic History   Marital status: Divorced    Spouse name: Not on file   Number of children:  2   Years of education: 57   Highest education level: 12th grade  Occupational History   Occupation: RETIRED  Tobacco Use   Smoking status:  Never   Smokeless tobacco: Never  Vaping Use   Vaping Use: Never used  Substance and Sexual Activity   Alcohol use: Never   Drug use: Never   Sexual activity: Not Currently  Other Topics Concern   Not on file  Social History Narrative   Not on file   Social Determinants of Health   Financial Resource Strain: Not on file  Food Insecurity: Not on file  Transportation Needs: Not on file  Physical Activity: Not on file  Stress: Not on file  Social Connections: Not on file  Intimate Partner Violence: Not on file    Family History  Problem Relation Age of Onset   Hypertension Mother    Prostate cancer Father    AAA (abdominal aortic aneurysm) Brother      Current Outpatient Medications:    aspirin 81 MG EC tablet, Take 81 mg by mouth daily. Swallow whole., Disp: , Rfl:    Calcium Carbonate (CALCIUM 600 PO), Take 1 tablet by mouth daily., Disp: , Rfl:    Cholecalciferol (VITAMIN D3) 5000 units CAPS, Take 1 capsule by mouth daily., Disp: , Rfl:    EXFORGE HCT 5-160-12.5 MG TABS, Take 1 tablet by mouth daily., Disp: , Rfl:    famotidine (PEPCID) 40 MG tablet, Take 40 mg by mouth at bedtime., Disp: , Rfl:    KRILL OIL PO, Take 1 capsule by mouth daily. Unknown strenght, Disp: , Rfl:    levothyroxine (SYNTHROID, LEVOTHROID) 75 MCG tablet, Take 75 mcg by mouth daily before breakfast. , Disp: , Rfl:    omeprazole (PRILOSEC) 40 MG capsule, Take 40 mg by mouth daily., Disp: , Rfl:    ondansetron (ZOFRAN) 4 MG tablet, Take 4 mg by mouth every 4 (four) hours as needed., Disp: , Rfl:    pravastatin (PRAVACHOL) 20 MG tablet, Take 1 tablet (20 mg total) by mouth every evening., Disp: 90 tablet, Rfl: 3   Probiotic Product (PROBIOTIC DAILY PO), Take 1 tablet by mouth daily., Disp: , Rfl:      Latest Ref Rng & Units 09/10/2021   12:00 AM  CMP  BUN 4 - 21 21      Creatinine 0.5 - 1.1 0.8      Sodium 137 - 147 136      Potassium 3.5 - 5.1 mEq/L 3.6      Chloride 99 - 108 103      CO2 13 -  22 24      Calcium 8.7 - 10.7 9.6      Alkaline Phos 25 - 125 81      AST 13 - 35 26      ALT 7 - 35 U/L 21         This result is from an external source.      Latest Ref Rng & Units 09/10/2021   12:00 AM  CBC  WBC  12.8      Hemoglobin 12.0 - 16.0 15.2      Hematocrit 36 - 46 45      Platelets 150 - 400 K/uL 214         This result is from an external source.    No images are attached to the encounter.  NM PET Image Initial (PI) Skull Base To Thigh  Result Date: 09/18/2021 CLINICAL DATA:  Initial treatment strategy for gastric cancer. EXAM: NUCLEAR MEDICINE PET SKULL BASE TO THIGH TECHNIQUE: 9.2 mCi F-18 FDG was injected intravenously. Full-ring PET imaging was performed from the skull base to thigh after the radiotracer. CT data was obtained and used for attenuation correction and anatomic localization. Fasting blood glucose: 124 mg/dl COMPARISON:  MRI July 20, 2021 and CT April 10, 2016 FINDINGS: Mediastinal blood pool activity: SUV max 2.4 Liver activity: SUV max NA NECK: No hypermetabolic cervical adenopathy. Symmetric hypermetabolic hyperplasia of the tonsils commonly reactive. Incidental CT findings: none CHEST: No hypermetabolic thoracic adenopathy. No hypermetabolic pulmonary nodules or masses. Incidental CT findings: Aortic atherosclerosis. Calcified mediastinal and right hilar lymph nodes. Motion degraded examination reveals no suspicious pulmonary nodules or masses. Patulous esophagus with a small hiatal hernia. ABDOMEN/PELVIS: Evaluation of the gastric wall is limited by minimal distension, within this context: Similar diffuse nonspecific gastric wall thickening with no significant change in the appearance of the possible gastric body ulceration seen on image 117/4 and diffuse hypermetabolic activity within the stomach demonstrating a max SUV of 8.6. Mildly metabolic nodularity anterior and inferior to the greater curvature of the stomach with the largest of which measures 22 x  13 mm on image 119/4 with a max SUV of 2.3. No abnormal hypermetabolic activity within the liver, pancreas, adrenal glands or spleen. No hypermetabolic abdominopelvic adenopathy. Incidental CT findings: Similar mild intrahepatic and moderate extrahepatic biliary ductal dilation with the common duct measuring 13 mm. Colonic diverticulosis without findings of acute diverticulitis. Aortic atherosclerosis. SKELETON: No focal hypermetabolic activity to suggest skeletal metastasis. Incidental CT findings: Multilevel degenerative changes spine IMPRESSION: 1. Evaluation of the gastric wall is limited by minimal distension, within this context, here is diffuse hypermetabolic activity within the stomach with similar diffuse nonspecific gastric wall thickening and no significant change in the appearance of the possible anterior gastric body ulceration. 2. Mildly metabolic omental nodularity anterior and inferior to the stomach, likely reflects omental disease involvement. 3. No convincing evidence of hypermetabolic metastatic disease in the neck, chest, or pelvis. 4. Similar mild intrahepatic and moderate extrahepatic biliary ductal dilation with the common duct measuring 13 mm no suspicious hypermetabolic lesion identified within the duct and no discrete lesion identified on prior MRI dated July 20, 2021. 5.  Aortic Atherosclerosis (ICD10-I70.0). Electronically Signed   By: Dahlia Bailiff M.D.   On: 09/18/2021 08:13     Assessment and plan- Patient is a 86 y.o. female who presents to [facility] for initial meeting in preparation for starting chemotherapy for the treatment of gastric cancer.   Chemo Care Clinic/High Risk for ER/Hospitalization during chemotherapy- We discussed the role of the chemo care clinic and identified patient specific risk factors. I discussed that patient was identified as high risk primarily based on:  Patient has past medical history positive for: Past Medical History:  Diagnosis Date    Appendicitis with peritonitis 04/10/2016   Atypical chest pain 09/09/2016   Benign hypertensive renal disease 09/01/2016   Bilateral primary osteoarthritis of knee 01/20/2016   Borderline diabetes 09/09/2016   CKD (chronic kidney disease), stage II 07/22/3708   Cyclic citrullinated peptide (CCP) antibody positive 01/20/2016   Because she has positive CCP, I want to make sure we monitor the patient closely and we encouraged the patient to look for symptoms that include increased hand stiffness, swelling and redness to the MCP joint.  If that happens, she is to call us so that we can schedule her for an ultrasound to look  for synovitis.     Essential hypertension 09/09/2016   Gastric cancer ([place]) 09/10/2021   Hyperlipidemia 09/01/2016   Hypertension    Hypothyroidism 09/01/2016   Osteoarthritis of both feet 01/20/2016   Osteoarthritis, hand 01/20/2016   Thyroid disease     Patient has past surgical history positive for: Past Surgical History:  Procedure Laterality Date   APPENDECTOMY     LAPAROSCOPIC APPENDECTOMY N/A 04/10/2016   Procedure: APPENDECTOMY LAPAROSCOPIC;  Surgeon: Coralie Keens, MD;  Location: [place];  Service: General;  Laterality: N/A;   Provided general information including the following: 1.  Date of education: 09/18/2021 2.  Physician name: Dr. Hinton Rao 3.  Diagnosis: Gastric Cancer 4.  Stage: Stage IVB 5.  Control 6.  Chemotherapy plan including drugs and how often: Fluorouracil, Leucovorin, Oxaliplatin, Nivolumab 7.  Start date: Pending authorization 8.  Other referrals: None at this time 9.  The patient is to call our office with any questions or concerns.  Our office number [phone], if after hours or on the weekend, call the same number and wait for the answering service.  There is always an oncologist on call 10.  Medications prescribed: Ondansetron, Prochlorperazine 11.  The patient has verbalized understanding of the treatment plan and has no barriers to  adherence or understanding.  Obtained signed consent from patient.  Discussed symptoms including 1.  Low blood counts including red blood cells, white blood cells and platelets. 2. Infection including to avoid large crowds, wash hands frequently, and stay away from people who were sick.  If fever develops of 100.4 or higher, call our office. 3.  Mucositis-given instructions on mouth rinse (baking soda and salt mixture).  Keep mouth clean.  Use soft bristle toothbrush.  If mouth sores develop, call our clinic. 4.  Nausea/vomiting-gave prescriptions for ondansetron 4 mg every 4 hours as needed for nausea, may take around the clock if persistent.  Compazine 10 mg every 6 hours, may take around the clock if persistent. 5.  Diarrhea-use over-the-counter Imodium.  Call clinic if not controlled. 6.  Constipation-use senna, 1 to 2 tablets twice a day.  If no BM in 2 to 3 days call the clinic. 7.  Loss of appetite-try to eat small meals every 2-3 hours.  Call clinic if not eating. 8.  Taste changes-zinc 500 mg daily.  If becomes severe call clinic. 9.  Alcoholic beverages. 10.  Drink 2 to 3 quarts of water per day. 11.  Peripheral neuropathy-patient to call if numbness or tingling in hands or feet is persistent  Neulasta-will be given 24 to 48 hours after chemotherapy.  Gave information sheet on bone and joint pain.  Use Claritin or Pepcid.  May use ibuprofen or Aleve.  Call if symptoms persist or are unbearable.  Gave information on the supportive care team and how to contact them regarding services.  Discussed advanced directives.  The patient does not have their advanced directives but will look at the copy provided in their notebook and will call with any questions. Spiritual Nutrition Financial Social worker Advanced directives  Answered questions to patient satisfaction.  Patient is to call with any further questions or concerns.  The medication prescribed to the patient will be printed  out from [place] references This will give the following information: Name of your medication Approved uses Dose and schedule Storage and handling Handling body fluids and waste Drug and food interactions Possible side effects and management Pregnancy, sexual activity, and contraception Obtaining medication   We discussed  that social determinants of health may have significant impacts on health and outcomes for cancer patients.  Today we discussed specific social determinants of performance status, alcohol use, depression, financial needs, food insecurity, housing, interpersonal violence, social connections, stress, tobacco use, and transportation.    After lengthy discussion the following were identified as areas of need:   Outpatient services: We discussed options including home based and outpatient services, DME and care program. We discusssed that patients who participate in regular physical activity report fewer negative impacts of cancer and treatments and report less fatigue.   Financial Concerns: We discussed that living with cancer can create tremendous financial burden.  We discussed options for assistance. I asked that if assistance is needed in affording medications or paying bills to please let us know so that we can provide assistance. We discussed options for food including social services and onsite food pantry.  We will also notify Mort Sawyers to see if cancer center can provide additional support.  Referral to Social work: Introduced Education officer, museum Mort Sawyers and the services she can provide such as support with utility bill, cell phone and gas vouchers.   Support groups: We discussed options for support groups at the cancer center. If interested, please notify nurse navigator to enroll. We discussed options for managing stress including healthy eating, exercise as well as participating in no charge counseling services at the cancer center and support groups.  If these are of  interest, patient can notify either myself or primary nursing team.We discussed options for management including medications and referral to quit Smart program  Transportation: We discussed options for transportation.  I have notified primary oncology team who will help assist with arranging Lucianne Lei transportation for appointments when/if needed. We also discussed options for transportation on short notice/acute visits.  Palliative care services: We have palliative care services available in the cancer center to discuss goals of care and advanced care planning.  Please let us know if you have any questions or would like to speak to our palliative nurse practitioner.  Symptom Management Clinic: We discussed our symptom management clinic which is available for acute concerns while receiving treatment such as nausea, vomiting or diarrhea.  We can be reached via telephone at [phone] or through my chart.  We are available for virtual or in person visits on the same day from 830 to 4 PM Monday through Friday. She denies needing specific assistance at this time and She will be followed by Dr. Hinton Rao clinical team.  Plan: Discussed symptom management clinic. Discussed palliative care services. Discussed resources that are available here at the cancer center. Discussed medications and new prescriptions to begin treatment such as anti-nausea or steroids.   Disposition: RTC on   Visit Diagnosis No diagnosis found.  Patient expressed understanding and was in agreement with this plan. She also understands that She can call clinic at any time with any questions, concerns, or complaints.   I provided 30 minutes of  face to face  during this encounter, and > 50% was spent counseling as documented under my assessment & plan.   Dayton Scrape, FNP-BC

## 2021-09-18 NOTE — Progress Notes (Signed)
START ON PATHWAY REGIMEN - Gastroesophageal     A cycle is every 14 days:     Nivolumab      Oxaliplatin      Leucovorin      Fluorouracil      Fluorouracil   **Always confirm dose/schedule in your pharmacy ordering system**  Patient Characteristics: Distant Metastases (cM1/pM1) / Locally Recurrent Disease, Adenocarcinoma - Esophageal, [place], and Gastric, First Line, HER2 Negative/Unknown, PD?L1 Expression  PositiveCPS ? 5 Therapeutic Status: Distant Metastases (No Additional Staging) Histology: Adenocarcinoma Disease Classification: Gastric Line of Therapy: First Line HER2 Status: Negative PD-L1 Expression Status: PD-L1 Expression Positive CPS ? 5 Intent of Therapy: Non-Curative / Palliative Intent, Discussed with Patient

## 2021-09-19 ENCOUNTER — Other Ambulatory Visit: Payer: Self-pay

## 2021-09-19 DIAGNOSIS — C168 Malignant neoplasm of overlapping sites of stomach: Secondary | ICD-10-CM

## 2021-09-20 ENCOUNTER — Other Ambulatory Visit: Payer: Self-pay | Admitting: Oncology

## 2021-09-20 DIAGNOSIS — C168 Malignant neoplasm of overlapping sites of stomach: Secondary | ICD-10-CM

## 2021-09-20 NOTE — Progress Notes (Signed)
[facility]  [address] [place],  [place]  [address] [phone]  Clinic Day: 09/21/21  Referring physician: Ernestene Kiel, MD   ASSESSMENT & PLAN:   Gastric adenocarcinoma This is a poorly differentiated adenocarcinoma with signet ring features and ulceration.  I would consider it as metastatic to the omentum and so that would stage this as a T4 N0 M1, stage IVB.  I have discussed the fact that she will need systemic treatment with chemotherapy and will also benefit from the addition of immunotherapy to improve the success rate.  I have recommended guardant testing of the tumor as well as of her blood to see if we have any other targetable mutations.  She will have a formal chemotherapy education session prior to starting treatment.  Stain for HER2 was negative at 0.    Her port was placed this morning by Dr. Amalia Hailey.  I have recommended she increase her omeprazole to twice daily.  I will have the dietitian meet with her and advised her on dietary interventions that may help.  We hope to get started fairly soon, planning for 8/14.Marland Kitchen  She will have a chemotherapy education session this week prior to starting and antiemetics will be prescribed. We plan FOLFOX and nivolumab every 2 weeks.  I have outlined this plan and diagnosis and written it down for the patient and her family as well as the most commonly expected toxicities and schedule.  Testing for HER2 was negative, but we will check PD-L1 as well.  I also discussed that she may be a candidate for genetic testing with 2 first-degree relatives with breast cancer and her advanced gastric cancer as well as her father with prostate cancer.  We will therefore make her a referral to the genetics counselor for their opinion.  I discussed the assessment and treatment plan with the patient.  The patient was provided an opportunity to ask questions and all were answered.  The patient agreed with the plan and  demonstrated an understanding of the instructions.  The patient was advised to call back if the symptoms worsen or if the condition fails to improve as anticipated.   I provided 60 minutes of face-to-face time during this this encounter and > 50% was spent counseling as documented under my assessment and plan.    Derwood Kaplan, MD [facility] [address] [place] [address] Dept: [phone] Dept Fax: [phone]   CHIEF COMPLAINT:  CC: Gastric cancer  Current Treatment: Chemotherapy/immunotherapy   HISTORY OF PRESENT ILLNESS:  Mackenzie Lewis is a 86 y.o. female with a history of gastric cancer who is referred in consultation with Dr. Sandria Senter for assessment and management.  She had noticed that she was having regurgitation when eating and had lost over 30 pounds.  An ultrasound was done, revealing hepatic steatosis, and led to an MRI scan on June 5 which revealed gastric wall thickening with confluent nodularity of the omentum anterior to the stomach measuring 3.5 cm consistent with metastatic tumor.  She also had low-grade edema and wall thickening extending into the duodenum from the stomach.  She was referred to Dr. Sandria Senter and he did an EGD on July 7.  This revealed a large ulceration measuring 1.2 cm along the greater curvature.  She also had diffusely edematous and erythematous wall with erosions of the antrum and stiff and friable mucosa with oozing of blood.  These findings  extended to the gastric fundus as well.  Pathology revealed a poorly differentiated adenocarcinoma with signet ring features from the biopsies of the ulcer as well as the antrum and the fundus of the stomach.  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  She was placed on omeprazole.  Her test for H. pylori was negative.  She was referred to Dr. Kendell Bane for consideration of  surgery but he felt this was not resectable because of the extensive involvement and I agree.  I would consider this extending to the duodenum and also metastatic to the omentum.  We will plan to get a PET scan to confirm these findings but I think she needs systemic intravenous therapy.  She continues to eat and has adjusted her diet to softer foods and liquids and is drinking boost so she has maintained her weight.  She does have some Zofran ODT for nausea when needed.  She has had some anorexia, nausea, occasional vomiting, early satiety, dysphagia, and belching.  A CEA and CA 19-9 were normal.  INTERVAL HISTORY:  I have reviewed her chart and materials related to her cancer extensively and collaborated history with the patient. Summary of oncologic history is as follows: Oncology History  Gastric cancer ([facility])  09/10/2021 Initial Diagnosis   Gastric cancer ([facility])   09/10/2021 Cancer Staging   Staging form: Stomach, AJCC 8th Edition - Clinical stage from 09/10/2021: Stage IVB (cT4b, cN0, cM1) - Signed by Derwood Kaplan, MD on 09/10/2021 Histopathologic type: Adenocarcinoma, NOS Stage prefix: Initial diagnosis Total positive nodes: 0 Histologic grade (G): G3 Histologic grading system: 3 grade system Sites of metastasis: Peritoneal surface Diagnostic confirmation: Positive histology PLUS positive immunophenotyping and/or positive genetic studies Specimen type: Endoscopy with Biopsy Staged by: Managing physician Carcinoembryonic antigen (CEA) (ng/mL): 2.8 Carbohydrate antigen 19-9 (CA 19-9) (U/mL): 4.9 HER2 status: Unknown Microsatellite instability (MSI): Unknown Tumor location in stomach: Other Clinical staging modalities: Biopsy, Endoscopy Stage used in treatment planning: Yes National guidelines used in treatment planning: Yes Type of national guideline used in treatment planning: NCCN   09/28/2021 -  Chemotherapy   Patient is on Treatment Plan : GASTROESOPHAGEAL FOLFOX + Nivolumab  q14d       Mackenzie Lewis is seen in the clinic for follow up of her gastric cancer.We reviewed her PET scan report and it reveals diffuse hypermetabolic activity within the stomach with diffuse gastric wall thickening. There is metabolic omental nodularity anterior and inferior to the stomach. There is no other metastatic disease seen.  Her son, daughter and daughter-in-law are all here with her today and very involved with her care.  She appears quite healthy for her age and is active, exercising and working in the yard.  She does have yearly mammograms.  CBC  and CMP are normal.  She has been able to stabilize her weight at this time and she does have some degree of constipation so takes MiraLAX fairly regularly.  I recommended she go to daily.  I also recommended that she increase her omeprazole to twice daily as we prepare to start treatment.  She tells me she is on a medication at bedtime to help her sleep but did not know the name of this medication, but I will try to find out.  She does have Zofran ODT for nausea as needed.  She denies fever, chills, night sweats, or other signs of infection. She denies cardiorespiratory and gastrointestinal issues. She  denies pain. Her appetite is good.  Her weight has decreased by over 30 pounds.  She did take hormonal replacement therapy for 10 to 15 years after her hysterectomy.  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  If that  happens, she is to call us so that we can schedule her for an ultrasound to look for synovitis.     Essential hypertension 09/09/2016   Gastric cancer ([facility]) 09/10/2021   Hyperlipidemia 09/01/2016   Hypertension    Hypothyroidism 09/01/2016   Osteoarthritis of both feet 01/20/2016   Osteoarthritis, hand 01/20/2016   Thyroid disease   Degenerative disc disease History of endometriosis  Past Surgical History:  Procedure Laterality Date   APPENDECTOMY     LAPAROSCOPIC APPENDECTOMY N/A 04/10/2016   Procedure: APPENDECTOMY LAPAROSCOPIC;  Surgeon: Coralie Keens, MD;  Location: [facility];  Service: General;  Laterality: N/A;  Bilateral tubal ligation Total hysterectomy and bilateral salpingo-oophorectomy in 1980  Family History  Problem Relation Age of Onset   Hypertension Mother    Prostate cancer Father    AAA (abdominal aortic aneurysm) Brother   Her sister has had breast cancer as well as a tumor of her head Her daughter has had breast cancer last year  Social History:  reports that she has never smoked. She has never used smokeless tobacco. She reports that she does not drink alcohol and does not use drugs.The patient is accompanied by her son, daughter-in-law and daughter today.  She is single but lives with a significant other.  She has the 2 children.  She worked in [employer] (medical) and denies any chemical or toxin exposures.  She grew up in [place] and [place].  She is active and healthy, especially for her age.  Allergies:  Allergies  Allergen Reactions   Sulfa Antibiotics Rash    Other reaction(s): Other (See Comments) "Made me feel weird"    Current Medications: Current Outpatient Medications  Medication Sig Dispense Refill   aspirin 81 MG EC tablet Take 81 mg by mouth daily. Swallow whole.     Calcium Carbonate (CALCIUM 600 PO) Take 1 tablet by mouth daily.     Cholecalciferol (VITAMIN D3) 5000 units CAPS Take 1 capsule by mouth daily.     EXFORGE HCT 5-160-12.5 MG TABS Take 1 tablet  by mouth daily.     famotidine (PEPCID) 40 MG tablet Take 40 mg by mouth at bedtime.     KRILL OIL PO Take 1 capsule by mouth daily. Unknown strenght     levothyroxine (SYNTHROID, LEVOTHROID) 75 MCG tablet Take 75 mcg by mouth daily before breakfast.      omeprazole (PRILOSEC) 40 MG capsule Take 40 mg by mouth daily.     ondansetron (ZOFRAN) 4 MG tablet Take 4 mg by mouth every 4 (four) hours as needed.     ondansetron (ZOFRAN-ODT) 4 MG disintegrating tablet Take 1 tablet (4 mg total) by mouth every 8 (eight) hours as needed for nausea or vomiting. 90 tablet 0   pravastatin (PRAVACHOL) 20 MG  tablet Take 1 tablet (20 mg total) by mouth every evening. 90 tablet 3   Probiotic Product (PROBIOTIC DAILY PO) Take 1 tablet by mouth daily.     prochlorperazine (COMPAZINE) 10 MG tablet Take 1 tablet (10 mg total) by mouth every 6 (six) hours as needed for nausea or vomiting. 90 tablet 3   No current facility-administered medications for this visit.    REVIEW OF SYSTEMS:  Review of Systems  Constitutional:  Positive for appetite change and unexpected weight change.  HENT:   Positive for trouble swallowing.   Eyes: Negative.   Respiratory: Negative.    Cardiovascular: Negative.   Gastrointestinal:  Positive for constipation and nausea.       She does have some dysphagia and regurgitation with certain foods.  She has to eat very slowly.  Genitourinary: Negative.    Musculoskeletal: Negative.   Skin: Negative.   Neurological: Negative.   Hematological: Negative.   Psychiatric/Behavioral: Negative.        VITALS:  Blood pressure (!) 147/71, pulse 68, temperature 98.5 F (36.9 C), temperature source Oral, resp. rate 18, height _0  (1.626 m), weight 189 lb 12.8 oz (86.1 kg), SpO2 95 %.  Wt Readings from Last 3 Encounters:  10/12/21 188 lb 1.9 oz (85.3 kg)  10/08/21 187 lb 9.6 oz (85.1 kg)  09/30/21 190 lb (86.2 kg)    Body mass index is 32.58 kg/m.  Performance status (ECOG): 1 -  Symptomatic but completely ambulatory  PHYSICAL EXAM:  Physical Exam Constitutional:      Appearance: Normal appearance.  HENT:     Head: Normocephalic and atraumatic.     Nose: Nose normal.     Mouth/Throat:     Pharynx: Oropharynx is clear.  Eyes:     Extraocular Movements: Extraocular movements intact.     Conjunctiva/sclera: Conjunctivae normal.     Pupils: Pupils are equal, round, and reactive to light.  Cardiovascular:     Rate and Rhythm: Normal rate and regular rhythm.     Heart sounds: Normal heart sounds.  Pulmonary:     Effort: Pulmonary effort is normal.     Breath sounds: Normal breath sounds.  Abdominal:     General: Bowel sounds are normal.     Palpations: Abdomen is soft. There is mass.     Comments: She has a long mass effect in the epigastric area measuring at least 12 to 15 cm long and hard to palpation  Musculoskeletal:        General: Normal range of motion.     Cervical back: Normal range of motion and neck supple.  Skin:    General: Skin is warm and dry.  Neurological:     General: No focal deficit present.     Mental Status: She is alert and oriented to person, place, and time.  Psychiatric:        Mood and Affect: Mood normal.        Behavior: Behavior normal.        Thought Content: Thought content normal.        Judgment: Judgment normal.     LABS:      Latest Ref Rng & Units 10/08/2021   12:00 AM 09/21/2021   12:00 AM 09/10/2021   12:00 AM  CBC  WBC  7.6     10.7     12.8      Hemoglobin 12.0 - 16.0 13.7     14.3     15.2  Hematocrit 36 - 46 39     42     45      Platelets 150 - 400 K/uL 241     197     214         This result is from an external source.      Latest Ref Rng & Units 10/08/2021   12:00 AM 09/21/2021   12:00 AM 09/10/2021   12:00 AM  CMP  BUN 4 - _0 Creatinine 0.5 - 1.1 0.9     0.8     0.8      Sodium 137 - 147 137     138     136      Potassium 3.5 - 5.1 mEq/L 3.5     3.6     3.6       Chloride 99 - 108 105     104     103      CO2 13 - _1 Calcium 8.7 - 10.7 9.0     9.1     9.6      Alkaline Phos 25 - 125 64     72     81      AST 13 - 35 _2 ALT 7 - 35 U/L _3 This result is from an external source.     Lab Results  Component Value Date   CEA1 2.9 09/10/2021   /  CEA  Date Value Ref Range Status  09/10/2021 2.9 0.0 - 4.7 ng/mL Final    Comment:    (NOTE)                             Nonsmokers          <3.9                             Smokers             <5.6 [employer] Electrochemiluminescence Immunoassay (ECLIA) Values obtained with different assay methods or kits cannot be used interchangeably.  Results cannot be interpreted as absolute evidence of the presence or absence of malignant disease. Performed At: [facility] [place], [place] [id number] Rush Farmer MD DG:[id number]    No results found for: "PSA1" No results found for: [phone]" No results found for: "CAN125"  No results found for: "TOTALPROTELP", "ALBUMINELP", "A1GS", "A2GS", "BETS", "BETA2SER", "GAMS", "MSPIKE", "SPEI" No results found for: "TIBC", "FERRITIN", "IRONPCTSAT" No results found for: "LDH"  STUDIES:  NM PET Image Initial (PI) Skull Base To Thigh  Result Date: 09/18/2021 CLINICAL DATA:  Initial treatment strategy for gastric cancer. EXAM: NUCLEAR MEDICINE PET SKULL BASE TO THIGH TECHNIQUE: 9.2 mCi F-18 FDG was injected intravenously. Full-ring PET imaging was performed from the skull base to thigh after the radiotracer. CT data was obtained and used for attenuation correction and anatomic localization. Fasting blood glucose: 124 mg/dl COMPARISON:  MRI July 20, 2021 and CT April 10, 2016 FINDINGS: Mediastinal blood pool activity: SUV max 2.4 Liver  activity: SUV max NA NECK: No hypermetabolic cervical adenopathy. Symmetric hypermetabolic hyperplasia of the tonsils commonly reactive.  Incidental CT findings: none CHEST: No hypermetabolic thoracic adenopathy. No hypermetabolic pulmonary nodules or masses. Incidental CT findings: Aortic atherosclerosis. Calcified mediastinal and right hilar lymph nodes. Motion degraded examination reveals no suspicious pulmonary nodules or masses. Patulous esophagus with a small hiatal hernia. ABDOMEN/PELVIS: Evaluation of the gastric wall is limited by minimal distension, within this context: Similar diffuse nonspecific gastric wall thickening with no significant change in the appearance of the possible gastric body ulceration seen on image 117/4 and diffuse hypermetabolic activity within the stomach demonstrating a max SUV of 8.6. Mildly metabolic nodularity anterior and inferior to the greater curvature of the stomach with the largest of which measures 22 x 13 mm on image 119/4 with a max SUV of 2.3. No abnormal hypermetabolic activity within the liver, pancreas, adrenal glands or spleen. No hypermetabolic abdominopelvic adenopathy. Incidental CT findings: Similar mild intrahepatic and moderate extrahepatic biliary ductal dilation with the common duct measuring 13 mm. Colonic diverticulosis without findings of acute diverticulitis. Aortic atherosclerosis. SKELETON: No focal hypermetabolic activity to suggest skeletal metastasis. Incidental CT findings: Multilevel degenerative changes spine IMPRESSION: 1. Evaluation of the gastric wall is limited by minimal distension, within this context, here is diffuse hypermetabolic activity within the stomach with similar diffuse nonspecific gastric wall thickening and no significant change in the appearance of the possible anterior gastric body ulceration. 2. Mildly metabolic omental nodularity anterior and inferior to the stomach, likely reflects omental disease involvement. 3. No convincing evidence of hypermetabolic metastatic disease in the neck, chest, or pelvis. 4. Similar mild intrahepatic and moderate extrahepatic  biliary ductal dilation with the common duct measuring 13 mm no suspicious hypermetabolic lesion identified within the duct and no discrete lesion identified on prior MRI dated July 20, 2021. 5.  Aortic Atherosclerosis (ICD10-I70.0). Electronically Signed   By: Dahlia Bailiff M.D.   On: 09/18/2021 08:13

## 2021-09-21 ENCOUNTER — Encounter: Payer: Self-pay | Admitting: Oncology

## 2021-09-21 ENCOUNTER — Inpatient Hospital Stay (INDEPENDENT_AMBULATORY_CARE_PROVIDER_SITE_OTHER): Payer: Medicare Other | Admitting: Oncology

## 2021-09-21 ENCOUNTER — Inpatient Hospital Stay: Payer: Medicare Other

## 2021-09-21 VITALS — BP 147/71 | HR 68 | Temp 98.5°F | Resp 18 | Ht 64.0 in | Wt 189.8 lb

## 2021-09-21 DIAGNOSIS — Z01810 Encounter for preprocedural cardiovascular examination: Secondary | ICD-10-CM | POA: Diagnosis not present

## 2021-09-21 DIAGNOSIS — C168 Malignant neoplasm of overlapping sites of stomach: Secondary | ICD-10-CM

## 2021-09-21 DIAGNOSIS — Z79899 Other long term (current) drug therapy: Secondary | ICD-10-CM | POA: Diagnosis not present

## 2021-09-21 DIAGNOSIS — C169 Malignant neoplasm of stomach, unspecified: Secondary | ICD-10-CM | POA: Diagnosis not present

## 2021-09-21 DIAGNOSIS — D649 Anemia, unspecified: Secondary | ICD-10-CM | POA: Diagnosis not present

## 2021-09-21 DIAGNOSIS — Z5111 Encounter for antineoplastic chemotherapy: Secondary | ICD-10-CM | POA: Diagnosis not present

## 2021-09-21 DIAGNOSIS — I999 Unspecified disorder of circulatory system: Secondary | ICD-10-CM | POA: Diagnosis not present

## 2021-09-21 DIAGNOSIS — I491 Atrial premature depolarization: Secondary | ICD-10-CM | POA: Diagnosis not present

## 2021-09-21 LAB — COMPREHENSIVE METABOLIC PANEL
Albumin: 4.2 (ref 3.5–5.0)
Calcium: 9.1 (ref 8.7–10.7)

## 2021-09-21 LAB — BASIC METABOLIC PANEL
BUN: 19 (ref 4–21)
CO2: 27 — AB (ref 13–22)
Chloride: 104 (ref 99–108)
Creatinine: 0.8 (ref 0.5–1.1)
Glucose: 103
Potassium: 3.6 mEq/L (ref 3.5–5.1)
Sodium: 138 (ref 137–147)

## 2021-09-21 LAB — HEPATIC FUNCTION PANEL
ALT: 19 U/L (ref 7–35)
AST: 22 (ref 13–35)
Alkaline Phosphatase: 72 (ref 25–125)
Bilirubin, Total: 0.5

## 2021-09-21 LAB — CBC AND DIFFERENTIAL
HCT: 42 (ref 36–46)
Hemoglobin: 14.3 (ref 12.0–16.0)
Neutrophils Absolute: 6.74
Platelets: 197 10*3/uL (ref 150–400)
WBC: 10.7

## 2021-09-21 LAB — TSH: TSH: 1.099 u[IU]/mL (ref 0.350–4.500)

## 2021-09-21 LAB — CBC: RBC: 4.93 (ref 3.87–5.11)

## 2021-09-22 ENCOUNTER — Encounter: Payer: Self-pay | Admitting: Oncology

## 2021-09-22 ENCOUNTER — Other Ambulatory Visit: Payer: Self-pay

## 2021-09-23 ENCOUNTER — Other Ambulatory Visit: Payer: Self-pay | Admitting: Pharmacist

## 2021-09-23 LAB — T4: T4, Total: 8.1 ug/dL (ref 4.5–12.0)

## 2021-09-23 NOTE — Progress Notes (Signed)
..  Pharmacist Chemotherapy Monitoring - Initial Assessment    Anticipated start date: 09/28/2021  The following has been reviewed per standard work regarding the patient's treatment regimen: The patient's diagnosis, treatment plan and drug doses, and organ/hematologic function Lab orders and baseline tests specific to treatment regimen  The treatment plan start date, drug sequencing, and pre-medications Prior authorization status  Patient's documented medication list, including drug-drug interaction screen and prescriptions for anti-emetics and supportive care specific to the treatment regimen The drug concentrations, fluid compatibility, administration routes, and timing of the medications to be used The patient's access for treatment and lifetime cumulative dose history, if applicable  The patient's medication allergies and previous infusion related reactions, if applicable   Changes made to treatment plan:  N/A  Follow up needed:  N/A   Juanetta Beets, [facility], 09/23/2021  4:09 PM

## 2021-09-25 MED FILL — Fluorouracil IV Soln 5 GM/100ML (50 MG/ML): INTRAVENOUS | Qty: 76 | Status: AC

## 2021-09-25 MED FILL — Oxaliplatin IV Soln 100 MG/20ML: INTRAVENOUS | Qty: 27 | Status: AC

## 2021-09-25 MED FILL — Dexamethasone Sodium Phosphate Inj 100 MG/10ML: INTRAMUSCULAR | Qty: 1 | Status: AC

## 2021-09-25 MED FILL — Leucovorin Calcium For Inj 350 MG: INTRAMUSCULAR | Qty: 31.5 | Status: AC

## 2021-09-25 MED FILL — Fluorouracil IV Soln 2.5 GM/50ML (50 MG/ML): INTRAVENOUS | Qty: 13 | Status: AC

## 2021-09-27 DIAGNOSIS — C169 Malignant neoplasm of stomach, unspecified: Secondary | ICD-10-CM | POA: Diagnosis not present

## 2021-09-28 ENCOUNTER — Inpatient Hospital Stay: Payer: Medicare Other

## 2021-09-28 VITALS — BP 144/69 | HR 74 | Temp 98.2°F | Resp 18 | Ht 64.0 in | Wt 190.1 lb

## 2021-09-28 DIAGNOSIS — Z5111 Encounter for antineoplastic chemotherapy: Secondary | ICD-10-CM | POA: Diagnosis not present

## 2021-09-28 DIAGNOSIS — Z79899 Other long term (current) drug therapy: Secondary | ICD-10-CM | POA: Diagnosis not present

## 2021-09-28 DIAGNOSIS — C168 Malignant neoplasm of overlapping sites of stomach: Secondary | ICD-10-CM

## 2021-09-28 DIAGNOSIS — C169 Malignant neoplasm of stomach, unspecified: Secondary | ICD-10-CM | POA: Diagnosis not present

## 2021-09-28 MED ORDER — OXALIPLATIN CHEMO INJECTION 100 MG/20ML
68.0000 mg/m2 | Freq: Once | INTRAVENOUS | Status: AC
  Administered 2021-09-28: 135 mg via INTRAVENOUS
  Filled 2021-09-28: qty 20

## 2021-09-28 MED ORDER — SODIUM CHLORIDE 0.9 % IV SOLN
10.0000 mg | Freq: Once | INTRAVENOUS | Status: AC
  Administered 2021-09-28: 10 mg via INTRAVENOUS
  Filled 2021-09-28: qty 10

## 2021-09-28 MED ORDER — SODIUM CHLORIDE 0.9 % IV SOLN
1920.0000 mg/m2 | INTRAVENOUS | Status: DC
  Administered 2021-09-28: 3800 mg via INTRAVENOUS
  Filled 2021-09-28: qty 76

## 2021-09-28 MED ORDER — SODIUM CHLORIDE 0.9 % IV SOLN
240.0000 mg | Freq: Once | INTRAVENOUS | Status: AC
  Administered 2021-09-28: 240 mg via INTRAVENOUS
  Filled 2021-09-28: qty 24

## 2021-09-28 MED ORDER — DEXTROSE 5 % IV SOLN: Freq: Once | INTRAVENOUS | Status: AC

## 2021-09-28 MED ORDER — FLUOROURACIL CHEMO INJECTION 2.5 GM/50ML
320.0000 mg/m2 | Freq: Once | INTRAVENOUS | Status: AC
  Administered 2021-09-28: 650 mg via INTRAVENOUS
  Filled 2021-09-28: qty 13

## 2021-09-28 MED ORDER — PALONOSETRON HCL INJECTION 0.25 MG/5ML
0.2500 mg | Freq: Once | INTRAVENOUS | Status: AC
  Administered 2021-09-28: 0.25 mg via INTRAVENOUS

## 2021-09-28 MED ORDER — LEUCOVORIN CALCIUM INJECTION 350 MG
320.0000 mg/m2 | Freq: Once | INTRAVENOUS | Status: AC
  Administered 2021-09-28: 630 mg via INTRAVENOUS
  Filled 2021-09-28: qty 31.5

## 2021-09-28 NOTE — Patient Instructions (Signed)
[facility]  Discharge Instructions: Thank you for choosing [facility] to provide your oncology and hematology care.  If you have a lab appointment with the [facility], please go directly to the [facility] and check in at the registration area.   Wear comfortable clothing and clothing appropriate for easy access to any Portacath or PICC line.   We strive to give you quality time with your provider. You may need to reschedule your appointment if you arrive late (15 or more minutes).  Arriving late affects you and other patients whose appointments are after yours.  Also, if you miss three or more appointments without notifying the office, you may be dismissed from the clinic at the provider's discretion.      For prescription refill requests, have your pharmacy contact our office and allow 72 hours for refills to be completed.    Today you received the following chemotherapy and/or immunotherapy agents    To help prevent nausea and vomiting after your treatment, we encourage you to take your nausea medication as directed.  BELOW ARE SYMPTOMS THAT SHOULD BE REPORTED IMMEDIATELY: *FEVER GREATER THAN 100.4 F (38 C) OR HIGHER *CHILLS OR SWEATING *NAUSEA AND VOMITING THAT IS NOT CONTROLLED WITH YOUR NAUSEA MEDICATION *UNUSUAL SHORTNESS OF BREATH *UNUSUAL BRUISING OR BLEEDING *URINARY PROBLEMS (pain or burning when urinating, or frequent urination) *BOWEL PROBLEMS (unusual diarrhea, constipation, pain near the anus) TENDERNESS IN MOUTH AND THROAT WITH OR WITHOUT PRESENCE OF ULCERS (sore throat, sores in mouth, or a toothache) UNUSUAL RASH, SWELLING OR PAIN  UNUSUAL VAGINAL DISCHARGE OR ITCHING   Items with * indicate a potential emergency and should be followed up as soon as possible or go to the Emergency Department if any problems should occur.  Please show the CHEMOTHERAPY ALERT CARD or IMMUNOTHERAPY ALERT CARD at check-in to the Emergency  Department and triage nurse.  Should you have questions after your visit or need to cancel or reschedule your appointment, please contact [facility]  Dept: [phone]  and follow the prompts.  Office hours are 8:00 a.m. to 4:30 p.m. Monday - Friday. Please note that voicemails left after 4:00 p.m. may not be returned until the following business day.  We are closed weekends and major holidays. You have access to a nurse at all times for urgent questions. Please call the main number to the clinic Dept: [phone] and follow the prompts.  For any non-urgent questions, you may also contact your provider using MyChart. We now offer e-Visits for anyone 62 and older to request care online for non-urgent symptoms. For details visit [email or web address].   Also download the MyChart app! Go to the app store, search "MyChart", open the app, select [facility], and log in with your MyChart username and password.  Masks are optional in the cancer centers. If you would like for your care team to wear a mask while they are taking care of you, please let them know. You may have one support person who is at least 84 years old accompany you for your appointments. Oxaliplatin Injection What is this medication? OXALIPLATIN (ox AL i PLA tin) treats some types of cancer. It works by slowing down the growth of cancer cells. This medicine may be used for other purposes; ask your health care provider or pharmacist if you have questions. COMMON BRAND NAME(S): Eloxatin What should I tell my care team before I take this medication? They need to know if  you have any of these conditions: Heart disease History of irregular heartbeat or rhythm Liver disease Low blood cell levels (white cells, red cells, and platelets) Lung or breathing disease, such as asthma Take medications that treat or prevent blood clots Tingling of the fingers, toes, or other nerve disorder An unusual or allergic  reaction to oxaliplatin, other medications, foods, dyes, or preservatives If you or your partner are pregnant or trying to get pregnant Breast-feeding How should I use this medication? This medication is injected into a vein. It is given by your care team in a hospital or clinic setting. Talk to your care team about the use of this medication in children. Special care may be needed. Overdosage: If you think you have taken too much of this medicine contact a poison control center or emergency room at once. NOTE: This medicine is only for you. Do not share this medicine with others. What if I miss a dose? Keep appointments for follow-up doses. It is important not to miss a dose. Call your care team if you are unable to keep an appointment. What may interact with this medication? Do not take this medication with any of the following: Cisapride Dronedarone Pimozide Thioridazine This medication may also interact with the following: Aspirin and aspirin-like medications Certain medications that treat or prevent blood clots, such as warfarin, apixaban, dabigatran, and rivaroxaban Cisplatin Cyclosporine Diuretics Medications for infection, such as acyclovir, adefovir, amphotericin B, bacitracin, cidofovir, foscarnet, ganciclovir, gentamicin, pentamidine, vancomycin NSAIDs, medications for pain and inflammation, such as ibuprofen or naproxen Other medications that cause heart rhythm changes Pamidronate Zoledronic acid This list may not describe all possible interactions. Give your health care provider a list of all the medicines, herbs, non-prescription drugs, or dietary supplements you use. Also tell them if you smoke, drink alcohol, or use illegal drugs. Some items may interact with your medicine. What should I watch for while using this medication? Your condition will be monitored carefully while you are receiving this medication. You may need blood work while taking this medication. This  medication may make you feel generally unwell. This is not uncommon as chemotherapy can affect healthy cells as well as cancer cells. Report any side effects. Continue your course of treatment even though you feel ill unless your care team tells you to stop. This medication may increase your risk of getting an infection. Call your care team for advice if you get a fever, chills, sore throat, or other symptoms of a cold or flu. Do not treat yourself. Try to avoid being around people who are sick. Avoid taking medications that contain aspirin, acetaminophen, ibuprofen, naproxen, or ketoprofen unless instructed by your care team. These medications may hide a fever. Be careful brushing or flossing your teeth or using a toothpick because you may get an infection or bleed more easily. If you have any dental work done, tell your dentist you are receiving this medication. This medication can make you more sensitive to cold. Do not drink cold drinks or use ice. Cover exposed skin before coming in contact with cold temperatures or cold objects. When out in cold weather wear warm clothing and cover your mouth and nose to warm the air that goes into your lungs. Tell your care team if you get sensitive to the cold. Talk to your care team if you or your partner are pregnant or think either of you might be pregnant. This medication can cause serious birth defects if taken during pregnancy and for 9 months  after the last dose. A negative pregnancy test is required before starting this medication. A reliable form of contraception is recommended while taking this medication and for 9 months after the last dose. Talk to your care team about effective forms of contraception. Do not father a child while taking this medication and for 6 months after the last dose. Use a condom while having sex during this time period. Do not breastfeed while taking this medication and for 3 months after the last dose. This medication may cause  infertility. Talk to your care team if you are concerned about your fertility. What side effects may I notice from receiving this medication? Side effects that you should report to your care team as soon as possible: Allergic reactions--skin rash, itching, hives, swelling of the face, lips, tongue, or throat Bleeding--bloody or black, tar-like stools, vomiting blood or brown material that looks like coffee grounds, red or dark brown urine, small red or purple spots on skin, unusual bruising or bleeding Dry cough, shortness of breath or trouble breathing Heart rhythm changes--fast or irregular heartbeat, dizziness, feeling faint or lightheaded, chest pain, trouble breathing Infection--fever, chills, cough, sore throat, wounds that don't heal, pain or trouble when passing urine, general feeling of discomfort or being unwell Liver injury--right upper belly pain, loss of appetite, nausea, light-colored stool, dark yellow or brown urine, yellowing skin or eyes, unusual weakness or fatigue Low red blood cell level--unusual weakness or fatigue, dizziness, headache, trouble breathing Muscle injury--unusual weakness or fatigue, muscle pain, dark yellow or brown urine, decrease in amount of urine Pain, tingling, or numbness in the hands or feet Sudden and severe headache, confusion, change in vision, seizures, which may be signs of posterior reversible encephalopathy syndrome (PRES) Unusual bruising or bleeding Side effects that usually do not require medical attention (report to your care team if they continue or are bothersome): Diarrhea Nausea Pain, redness, or swelling with sores inside the mouth or throat Unusual weakness or fatigue Vomiting This list may not describe all possible side effects. Call your doctor for medical advice about side effects. You may report side effects to FDA at 1-800-FDA-1088. Where should I keep my medication? This medication is given in a hospital or clinic. It will not  be stored at home. NOTE: This sheet is a summary. It may not cover all possible information. If you have questions about this medicine, talk to your doctor, pharmacist, or health care provider.  2023 Elsevier/Gold Standard (2021-05-29 00:00:00) Leucovorin Injection What is this medication? LEUCOVORIN (loo koe VOR in) prevents side effects from certain medications, such as methotrexate. It works by increasing folate levels. This helps protect healthy cells in your body. It may also be used to treat anemia caused by low levels of folate. It can also be used with fluorouracil, a type of chemotherapy, to treat colorectal cancer. It works by increasing the effects of fluorouracil in the body. This medicine may be used for other purposes; ask your health care provider or pharmacist if you have questions. What should I tell my care team before I take this medication? They need to know if you have any of these conditions: Anemia from low levels of vitamin B12 in the blood An unusual or allergic reaction to leucovorin, folic acid, other medications, foods, dyes, or preservatives Pregnant or trying to get pregnant Breastfeeding How should I use this medication? This medication is injected into a vein or a muscle. It is given by your care team in a hospital or clinic  setting. Talk to your care team about the use of this medication in children. Special care may be needed. Overdosage: If you think you have taken too much of this medicine contact a poison control center or emergency room at once. NOTE: This medicine is only for you. Do not share this medicine with others. What if I miss a dose? Keep appointments for follow-up doses. It is important not to miss your dose. Call your care team if you are unable to keep an appointment. What may interact with this medication? Capecitabine Fluorouracil Phenobarbital Phenytoin Primidone Trimethoprim;sulfamethoxazole This list may not describe all possible  interactions. Give your health care provider a list of all the medicines, herbs, non-prescription drugs, or dietary supplements you use. Also tell them if you smoke, drink alcohol, or use illegal drugs. Some items may interact with your medicine. What should I watch for while using this medication? Your condition will be monitored carefully while you are receiving this medication. This medication may increase the side effects of 5-fluorouracil. Tell your care team if you have diarrhea or mouth sores that do not get better or that get worse. What side effects may I notice from receiving this medication? Side effects that you should report to your care team as soon as possible: Allergic reactions--skin rash, itching, hives, swelling of the face, lips, tongue, or throat This list may not describe all possible side effects. Call your doctor for medical advice about side effects. You may report side effects to FDA at 1-800-FDA-1088. Where should I keep my medication? This medication is given in a hospital or clinic. It will not be stored at home. NOTE: This sheet is a summary. It may not cover all possible information. If you have questions about this medicine, talk to your doctor, pharmacist, or health care provider.  2023 Elsevier/Gold Standard (2021-06-12 00:00:00)   The chemotherapy medication bag should finish at 46 hours, 96 hours, or 7 days. For example, if your pump is scheduled for 46 hours and it was put on at 4:00 p.m., it should finish at 2:00 p.m. the day it is scheduled to come off regardless of your appointment time.        If the display on your pump reads "Low Volume" and it is beeping, take the batteries out of the pump and come to the cancer center for it to be taken off.   If the pump alarms go off prior to the pump reading "Low Volume" then call [phone] and someone can assist you.  If the plunger comes out and the chemotherapy medication is leaking out, please use your  home chemo spill kit to clean up the spill. Do NOT use paper towels or other household products.  If you have problems or questions regarding your pump, please call either [phone] (24 hours a day) or the cancer center Monday-Friday 8:00 a.m.- 4:30 p.m. at the clinic number and we will assist you. If you are unable to get assistance, then go to the nearest Emergency Department and ask the staff to contact the IV team for assistance.   Fluorouracil Injection What is this medication? FLUOROURACIL (flure oh YOOR a sil) treats some types of cancer. It works by slowing down the growth of cancer cells. This medicine may be used for other purposes; ask your health care provider or pharmacist if you have questions. COMMON BRAND NAME(S): Adrucil What should I tell my care team before I take this medication? They need to know if you have any of  these conditions: Blood disorders Dihydropyrimidine dehydrogenase (DPD) deficiency Infection, such as chickenpox, cold sores, herpes Kidney disease Liver disease Poor nutrition Recent or ongoing radiation therapy An unusual or allergic reaction to fluorouracil, other medications, foods, dyes, or preservatives If you or your partner are pregnant or trying to get pregnant Breast-feeding How should I use this medication? This medication is injected into a vein. It is administered by your care team in a hospital or clinic setting. Talk to your care team about the use of this medication in children. Special care may be needed. Overdosage: If you think you have taken too much of this medicine contact a poison control center or emergency room at once. NOTE: This medicine is only for you. Do not share this medicine with others. What if I miss a dose? Keep appointments for follow-up doses. It is important not to miss your dose. Call your care team if you are unable to keep an appointment. What may interact with this medication? Do not take this medication with  any of the following: Live virus vaccines This medication may also interact with the following: Medications that treat or prevent blood clots, such as warfarin, enoxaparin, dalteparin This list may not describe all possible interactions. Give your health care provider a list of all the medicines, herbs, non-prescription drugs, or dietary supplements you use. Also tell them if you smoke, drink alcohol, or use illegal drugs. Some items may interact with your medicine. What should I watch for while using this medication? Your condition will be monitored carefully while you are receiving this medication. This medication may make you feel generally unwell. This is not uncommon as chemotherapy can affect healthy cells as well as cancer cells. Report any side effects. Continue your course of treatment even though you feel ill unless your care team tells you to stop. In some cases, you may be given additional medications to help with side effects. Follow all directions for their use. This medication may increase your risk of getting an infection. Call your care team for advice if you get a fever, chills, sore throat, or other symptoms of a cold or flu. Do not treat yourself. Try to avoid being around people who are sick. This medication may increase your risk to bruise or bleed. Call your care team if you notice any unusual bleeding. Be careful brushing or flossing your teeth or using a toothpick because you may get an infection or bleed more easily. If you have any dental work done, tell your dentist you are receiving this medication. Avoid taking medications that contain aspirin, acetaminophen, ibuprofen, naproxen, or ketoprofen unless instructed by your care team. These medications may hide a fever. Do not treat diarrhea with over the counter products. Contact your care team if you have diarrhea that lasts more than 2 days or if it is severe and watery. This medication can make you more sensitive to the  sun. Keep out of the sun. If you cannot avoid being in the sun, wear protective clothing and sunscreen. Do not use sun lamps, tanning beds, or tanning booths. Talk to your care team if you or your partner wish to become pregnant or think you might be pregnant. This medication can cause serious birth defects if taken during pregnancy and for 3 months after the last dose. A reliable form of contraception is recommended while taking this medication and for 3 months after the last dose. Talk to your care team about effective forms of contraception. Do not father a  child while taking this medication and for 3 months after the last dose. Use a condom while having sex during this time period. Do not breastfeed while taking this medication. This medication may cause infertility. Talk to your care team if you are concerned about your fertility. What side effects may I notice from receiving this medication? Side effects that you should report to your care team as soon as possible: Allergic reactions--skin rash, itching, hives, swelling of the face, lips, tongue, or throat Heart attack--pain or tightness in the chest, shoulders, arms, or jaw, nausea, shortness of breath, cold or clammy skin, feeling faint or lightheaded Heart failure--shortness of breath, swelling of the ankles, feet, or hands, sudden weight gain, unusual weakness or fatigue Heart rhythm changes--fast or irregular heartbeat, dizziness, feeling faint or lightheaded, chest pain, trouble breathing High ammonia level--unusual weakness or fatigue, confusion, loss of appetite, nausea, vomiting, seizures Infection--fever, chills, cough, sore throat, wounds that don't heal, pain or trouble when passing urine, general feeling of discomfort or being unwell Low red blood cell level--unusual weakness or fatigue, dizziness, headache, trouble breathing Pain, tingling, or numbness in the hands or feet, muscle weakness, change in vision, confusion or trouble  speaking, loss of balance or coordination, trouble walking, seizures Redness, swelling, and blistering of the skin over hands and feet Severe or prolonged diarrhea Unusual bruising or bleeding Side effects that usually do not require medical attention (report to your care team if they continue or are bothersome): Dry skin Headache Increased tears Nausea Pain, redness, or swelling with sores inside the mouth or throat Sensitivity to light Vomiting This list may not describe all possible side effects. Call your doctor for medical advice about side effects. You may report side effects to FDA at 1-800-FDA-1088. Where should I keep my medication? This medication is given in a hospital or clinic. It will not be stored at home. NOTE: This sheet is a summary. It may not cover all possible information. If you have questions about this medicine, talk to your doctor, pharmacist, or health care provider.  2023 Elsevier/Gold Standard (2021-06-09 00:00:00) Nivolumab Injection What is this medication? NIVOLUMAB (nye VOL ue mab) treats some types of cancer. It works by helping your immune system slow or stop the spread of cancer cells. It is a monoclonal antibody. This medicine may be used for other purposes; ask your health care provider or pharmacist if you have questions. COMMON BRAND NAME(S): Opdivo What should I tell my care team before I take this medication? They need to know if you have any of these conditions: Allogeneic stem cell transplant (uses someone else's stem cells) Autoimmune diseases, such as Crohn disease, ulcerative colitis, lupus History of chest radiation Nervous system problems, such as Guillain-Barre syndrome or myasthenia gravis Organ transplant An unusual or allergic reaction to nivolumab, other medications, foods, dyes, or preservatives Pregnant or trying to get pregnant Breast-feeding How should I use this medication? This medication is infused into a vein. It is given  in a hospital or clinic setting. A special MedGuide will be given to you before each treatment. Be sure to read this information carefully each time. Talk to your care team about the use of this medication in children. While it may be prescribed for children as young as 12 years for selected conditions, precautions do apply. Overdosage: If you think you have taken too much of this medicine contact a poison control center or emergency room at once. NOTE: This medicine is only for you. Do not share  this medicine with others. What if I miss a dose? Keep appointments for follow-up doses. It is important not to miss your dose. Call your care team if you are unable to keep an appointment. What may interact with this medication? Interactions have not been studied. This list may not describe all possible interactions. Give your health care provider a list of all the medicines, herbs, non-prescription drugs, or dietary supplements you use. Also tell them if you smoke, drink alcohol, or use illegal drugs. Some items may interact with your medicine. What should I watch for while using this medication? Your condition will be monitored carefully while you are receiving this medication. You may need blood work while taking this medication. This medication may cause serious skin reactions. They can happen weeks to months after starting the medication. Contact your care team right away if you notice fevers or flu-like symptoms with a rash. The rash may be red or purple and then turn into blisters or peeling of the skin. You may also notice a red rash with swelling of the face, lips, or lymph nodes in your neck or under your arms. Tell your care team right away if you have any change in your eyesight. Talk to your care team if you are pregnant or think you might be pregnant. A negative pregnancy test is required before starting this medication. A reliable form of contraception is recommended while taking this  medication and for 5 months after the last dose. Talk to your care team about effective forms of contraception. Do not breast-feed while taking this medication and for 5 months after the last dose. What side effects may I notice from receiving this medication? Side effects that you should report to your care team as soon as possible: Allergic reactions--skin rash, itching, hives, swelling of the face, lips, tongue, or throat Dry cough, shortness of breath or trouble breathing Eye pain, redness, irritation, or discharge with blurry or decreased vision Heart muscle inflammation--unusual weakness or fatigue, shortness of breath, chest pain, fast or irregular heartbeat, dizziness, swelling of the ankles, feet, or hands Hormone gland problems--headache, sensitivity to light, unusual weakness or fatigue, dizziness, fast or irregular heartbeat, increased sensitivity to cold or heat, excessive sweating, constipation, hair loss, increased thirst or amount of urine, tremors or shaking, irritability Infusion reactions--chest pain, shortness of breath or trouble breathing, feeling faint or lightheaded Kidney injury (glomerulonephritis)--decrease in the amount of urine, red or dark brown urine, foamy or bubbly urine, swelling of the ankles, hands, or feet Liver injury--right upper belly pain, loss of appetite, nausea, light-colored stool, dark yellow or brown urine, yellowing skin or eyes, unusual weakness or fatigue Pain, tingling, or numbness in the hands or feet, muscle weakness, change in vision, confusion or trouble speaking, loss of balance or coordination, trouble walking, seizures Rash, fever, and swollen lymph nodes Redness, blistering, peeling, or loosening of the skin, including inside the mouth Sudden or severe stomach pain, bloody diarrhea, fever, nausea, vomiting Side effects that usually do not require medical attention (report these to your care team if they continue or are bothersome): Bone,  joint, or muscle pain Diarrhea Fatigue Loss of appetite Nausea Skin rash This list may not describe all possible side effects. Call your doctor for medical advice about side effects. You may report side effects to FDA at 1-800-FDA-1088. Where should I keep my medication? This medication is given in a hospital or clinic. It will not be stored at home. NOTE: This sheet is a summary. It  may not cover all possible information. If you have questions about this medicine, talk to your doctor, pharmacist, or health care provider.  2023 Elsevier/Gold Standard (2021-01-02 00:00:00)

## 2021-09-29 ENCOUNTER — Telehealth: Payer: Self-pay

## 2021-09-29 NOTE — Telephone Encounter (Signed)
I spoke w/pt. She admits to having intermittent nausea, but no vomiting. I asked if she had taken anything for nausea. She replied, No, I wasn't sure I should". I reviewed with her how to take the Compazine q 6hr PRN, and when she can start the Zofran (3 days after chemo)if needed. I also discussed with her that sometimes those medications can cause constipation. Pt states she takes generic Miralax @ times. She only "had 2 little terds yesterday". I reinforced to her she should take the Miralax daily, unless she is having diarrhea. She voiced understanding. She was afraid she was going to have diarrhea from the treatment, so she hasn't taken any Miralax. She denies skin rash/itching, SOB, chest pain, fever and emesis. She didn't have any issues with the pump. She is aware of appt today and to go in sooner if her pumps starts beeping. Pt thanked me several times for calling and checking on her.

## 2021-09-30 ENCOUNTER — Inpatient Hospital Stay: Payer: Medicare Other

## 2021-09-30 VITALS — BP 140/60 | HR 85 | Temp 98.3°F | Resp 18 | Ht 64.0 in | Wt 190.0 lb

## 2021-09-30 DIAGNOSIS — C169 Malignant neoplasm of stomach, unspecified: Secondary | ICD-10-CM | POA: Diagnosis not present

## 2021-09-30 DIAGNOSIS — Z5111 Encounter for antineoplastic chemotherapy: Secondary | ICD-10-CM | POA: Diagnosis not present

## 2021-09-30 DIAGNOSIS — C168 Malignant neoplasm of overlapping sites of stomach: Secondary | ICD-10-CM

## 2021-09-30 DIAGNOSIS — Z79899 Other long term (current) drug therapy: Secondary | ICD-10-CM | POA: Diagnosis not present

## 2021-09-30 MED ORDER — HEPARIN SOD (PORK) LOCK FLUSH 100 UNIT/ML IV SOLN
500.0000 [IU] | Freq: Once | INTRAVENOUS | Status: AC | PRN
  Administered 2021-09-30: 500 [IU]

## 2021-09-30 MED ORDER — SODIUM CHLORIDE 0.9% FLUSH
10.0000 mL | INTRAVENOUS | Status: DC | PRN
  Administered 2021-09-30: 10 mL

## 2021-09-30 NOTE — Patient Instructions (Signed)
Fluorouracil Injection What is this medication? FLUOROURACIL (flure oh YOOR a sil) treats some types of cancer. It works by slowing down the growth of cancer cells. This medicine may be used for other purposes; ask your health care provider or pharmacist if you have questions. COMMON BRAND NAME(S): Adrucil What should I tell my care team before I take this medication? They need to know if you have any of these conditions: Blood disorders Dihydropyrimidine dehydrogenase (DPD) deficiency Infection, such as chickenpox, cold sores, herpes Kidney disease Liver disease Poor nutrition Recent or ongoing radiation therapy An unusual or allergic reaction to fluorouracil, other medications, foods, dyes, or preservatives If you or your partner are pregnant or trying to get pregnant Breast-feeding How should I use this medication? This medication is injected into a vein. It is administered by your care team in a hospital or clinic setting. Talk to your care team about the use of this medication in children. Special care may be needed. Overdosage: If you think you have taken too much of this medicine contact a poison control center or emergency room at once. NOTE: This medicine is only for you. Do not share this medicine with others. What if I miss a dose? Keep appointments for follow-up doses. It is important not to miss your dose. Call your care team if you are unable to keep an appointment. What may interact with this medication? Do not take this medication with any of the following: Live virus vaccines This medication may also interact with the following: Medications that treat or prevent blood clots, such as warfarin, enoxaparin, dalteparin This list may not describe all possible interactions. Give your health care provider a list of all the medicines, herbs, non-prescription drugs, or dietary supplements you use. Also tell them if you smoke, drink alcohol, or use illegal drugs. Some items may  interact with your medicine. What should I watch for while using this medication? Your condition will be monitored carefully while you are receiving this medication. This medication may make you feel generally unwell. This is not uncommon as chemotherapy can affect healthy cells as well as cancer cells. Report any side effects. Continue your course of treatment even though you feel ill unless your care team tells you to stop. In some cases, you may be given additional medications to help with side effects. Follow all directions for their use. This medication may increase your risk of getting an infection. Call your care team for advice if you get a fever, chills, sore throat, or other symptoms of a cold or flu. Do not treat yourself. Try to avoid being around people who are sick. This medication may increase your risk to bruise or bleed. Call your care team if you notice any unusual bleeding. Be careful brushing or flossing your teeth or using a toothpick because you may get an infection or bleed more easily. If you have any dental work done, tell your dentist you are receiving this medication. Avoid taking medications that contain aspirin, acetaminophen, ibuprofen, naproxen, or ketoprofen unless instructed by your care team. These medications may hide a fever. Do not treat diarrhea with over the counter products. Contact your care team if you have diarrhea that lasts more than 2 days or if it is severe and watery. This medication can make you more sensitive to the sun. Keep out of the sun. If you cannot avoid being in the sun, wear protective clothing and sunscreen. Do not use sun lamps, tanning beds, or tanning booths. Talk to   your care team if you or your partner wish to become pregnant or think you might be pregnant. This medication can cause serious birth defects if taken during pregnancy and for 3 months after the last dose. A reliable form of contraception is recommended while taking this  medication and for 3 months after the last dose. Talk to your care team about effective forms of contraception. Do not father a child while taking this medication and for 3 months after the last dose. Use a condom while having sex during this time period. Do not breastfeed while taking this medication. This medication may cause infertility. Talk to your care team if you are concerned about your fertility. What side effects may I notice from receiving this medication? Side effects that you should report to your care team as soon as possible: Allergic reactions--skin rash, itching, hives, swelling of the face, lips, tongue, or throat Heart attack--pain or tightness in the chest, shoulders, arms, or jaw, nausea, shortness of breath, cold or clammy skin, feeling faint or lightheaded Heart failure--shortness of breath, swelling of the ankles, feet, or hands, sudden weight gain, unusual weakness or fatigue Heart rhythm changes--fast or irregular heartbeat, dizziness, feeling faint or lightheaded, chest pain, trouble breathing High ammonia level--unusual weakness or fatigue, confusion, loss of appetite, nausea, vomiting, seizures Infection--fever, chills, cough, sore throat, wounds that don't heal, pain or trouble when passing urine, general feeling of discomfort or being unwell Low red blood cell level--unusual weakness or fatigue, dizziness, headache, trouble breathing Pain, tingling, or numbness in the hands or feet, muscle weakness, change in vision, confusion or trouble speaking, loss of balance or coordination, trouble walking, seizures Redness, swelling, and blistering of the skin over hands and feet Severe or prolonged diarrhea Unusual bruising or bleeding Side effects that usually do not require medical attention (report to your care team if they continue or are bothersome): Dry skin Headache Increased tears Nausea Pain, redness, or swelling with sores inside the mouth or throat Sensitivity  to light Vomiting This list may not describe all possible side effects. Call your doctor for medical advice about side effects. You may report side effects to FDA at 1-800-FDA-1088. Where should I keep my medication? This medication is given in a hospital or clinic. It will not be stored at home. NOTE: This sheet is a summary. It may not cover all possible information. If you have questions about this medicine, talk to your doctor, pharmacist, or health care provider.  2023 Elsevier/Gold Standard (2021-06-09 00:00:00)  

## 2021-10-01 DIAGNOSIS — E063 Autoimmune thyroiditis: Secondary | ICD-10-CM | POA: Diagnosis not present

## 2021-10-01 DIAGNOSIS — C168 Malignant neoplasm of overlapping sites of stomach: Secondary | ICD-10-CM | POA: Diagnosis not present

## 2021-10-01 DIAGNOSIS — Z6831 Body mass index (BMI) 31.0-31.9, adult: Secondary | ICD-10-CM | POA: Diagnosis not present

## 2021-10-01 DIAGNOSIS — E785 Hyperlipidemia, unspecified: Secondary | ICD-10-CM | POA: Diagnosis not present

## 2021-10-01 DIAGNOSIS — I10 Essential (primary) hypertension: Secondary | ICD-10-CM | POA: Diagnosis not present

## 2021-10-08 ENCOUNTER — Inpatient Hospital Stay (INDEPENDENT_AMBULATORY_CARE_PROVIDER_SITE_OTHER): Payer: Medicare Other | Admitting: Oncology

## 2021-10-08 ENCOUNTER — Inpatient Hospital Stay: Payer: Medicare Other

## 2021-10-08 ENCOUNTER — Encounter: Payer: Self-pay | Admitting: Oncology

## 2021-10-08 VITALS — BP 141/67 | HR 90 | Temp 98.1°F | Resp 18 | Ht 64.0 in | Wt 187.6 lb

## 2021-10-08 DIAGNOSIS — D649 Anemia, unspecified: Secondary | ICD-10-CM | POA: Diagnosis not present

## 2021-10-08 DIAGNOSIS — C168 Malignant neoplasm of overlapping sites of stomach: Secondary | ICD-10-CM

## 2021-10-08 DIAGNOSIS — Z5111 Encounter for antineoplastic chemotherapy: Secondary | ICD-10-CM | POA: Diagnosis not present

## 2021-10-08 DIAGNOSIS — Z79899 Other long term (current) drug therapy: Secondary | ICD-10-CM | POA: Diagnosis not present

## 2021-10-08 DIAGNOSIS — C169 Malignant neoplasm of stomach, unspecified: Secondary | ICD-10-CM | POA: Diagnosis not present

## 2021-10-08 LAB — CBC AND DIFFERENTIAL
HCT: 39 (ref 36–46)
Hemoglobin: 13.7 (ref 12.0–16.0)
Neutrophils Absolute: 4.03
Platelets: 241 10*3/uL (ref 150–400)
WBC: 7.6

## 2021-10-08 LAB — HEPATIC FUNCTION PANEL
ALT: 17 U/L (ref 7–35)
AST: 22 (ref 13–35)
Alkaline Phosphatase: 64 (ref 25–125)
Bilirubin, Total: 0.4

## 2021-10-08 LAB — COMPREHENSIVE METABOLIC PANEL
Albumin: 4.1 (ref 3.5–5.0)
Calcium: 9 (ref 8.7–10.7)

## 2021-10-08 LAB — BASIC METABOLIC PANEL
BUN: 17 (ref 4–21)
CO2: 27 — AB (ref 13–22)
Chloride: 105 (ref 99–108)
Creatinine: 0.9 (ref 0.5–1.1)
Glucose: 130
Potassium: 3.5 mEq/L (ref 3.5–5.1)
Sodium: 137 (ref 137–147)

## 2021-10-08 LAB — TSH: TSH: 1.119 u[IU]/mL (ref 0.350–4.500)

## 2021-10-08 LAB — CBC: RBC: 4.65 (ref 3.87–5.11)

## 2021-10-08 NOTE — Progress Notes (Unsigned)
Mackenzie Lewis  [address] 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  I would consider it as metastatic to the omentum and so that would stage this as a T4 N0 M1, stage IVB.  PET scan did not show evidence of metastatic disease other than the omentum. She is on treatment with chemotherapy of FOLFOX and immunotherapy with nivolumab to improve the success rate. Stain for HER2 was negative at 0.    She tolerated her first cycle of chemotherapy fairly well so we will proceed with the 2nd one next week. She may be a candidate for genetic testing with 2 first-degree relatives with breast cancer and her advanced gastric cancer as well as her father with prostate cancer.  We will therefore make her a referral to the genetics counselor for their opinion. We will then see her back in 2 weeks with CBC,CMP, TSH and T4 for the next cycle. I reviewed all of this with her and her family and answered their questions. She understands and agrees with this plan.  I provided 20 minutes of face-to-face time during this this encounter and > 50% was spent counseling as documented under my assessment and plan.    Derwood Kaplan, MD [facility] [address] [place] [address] Dept: [phone] Dept Fax: [phone]   CHIEF COMPLAINT:  CC: Gastric cancer  Current Treatment: Chemotherapy/immunotherapy   HISTORY OF PRESENT ILLNESS:  Mackenzie Lewis is a 86 y.o. female with a history of gastric cancer who is referred in consultation with Dr. Sandria Senter for assessment and management.  She had noticed that she was having regurgitation when eating and had lost over 30 pounds.  An ultrasound was done,  revealing hepatic steatosis, and led to an MRI scan on June 5 which revealed gastric wall thickening with confluent nodularity of the omentum anterior to the stomach measuring 3.5 cm consistent with metastatic tumor.  She also had low-grade edema and wall thickening extending into the duodenum from the stomach.  She was referred to Dr. Sandria Senter and he did an EGD on July 7.  This revealed a large ulceration measuring 1.2 cm along the greater curvature.  She also had diffusely edematous and erythematous wall with erosions of the antrum and stiff and friable mucosa with oozing of blood.  These findings extended to the gastric fundus as well.  Pathology revealed a poorly differentiated adenocarcinoma with signet ring features from the biopsies of the ulcer as well as the antrum and the fundus of the stomach.  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  She was placed on omeprazole.  Her test for H. pylori was negative.  She was referred to Dr. Kendell Bane for consideration of surgery but he felt this was not resectable because of the extensive involvement and I agree.  I would consider this extending to the duodenum and also metastatic to the omentum.  PET scan confirmed these findings and she wished to pursue systemic intravenous therapy.  She continues to eat and has adjusted her diet to softer foods and liquids and is drinking boost so she has maintained her weight.  She does have some Zofran ODT for nausea when needed.  She has had  some anorexia, nausea, occasional vomiting, early satiety, dysphagia, and belching.  A CEA and CA 19-9 were normal. She has been started on FOLFOX chemotherapy along with immunotherapy.  INTERVAL HISTORY:  I have reviewed her chart and materials related to her cancer extensively and collaborated history with the patient. Summary of oncologic history is as follows: Oncology History  Gastric cancer ([facility])  09/10/2021 Initial  Diagnosis   Gastric cancer ([facility])   09/10/2021 Cancer Staging   Staging form: Stomach, AJCC 8th Edition - Clinical stage from 09/10/2021: Stage IVB (cT4b, cN0, cM1) - Signed by Derwood Kaplan, MD on 09/10/2021 Histopathologic type: Adenocarcinoma, NOS Stage prefix: Initial diagnosis Total positive nodes: 0 Histologic grade (G): G3 Histologic grading system: 3 grade system Sites of metastasis: Peritoneal surface Diagnostic confirmation: Positive histology PLUS positive immunophenotyping and/or positive genetic studies Specimen type: Endoscopy with Biopsy Staged by: Managing physician Carcinoembryonic antigen (CEA) (ng/mL): 2.8 Carbohydrate antigen 19-9 (CA 19-9) (U/mL): 4.9 HER2 status: Unknown Microsatellite instability (MSI): Unknown Tumor location in stomach: Other Clinical staging modalities: Biopsy, Endoscopy Stage used in treatment planning: Yes National guidelines used in treatment planning: Yes Type of national guideline used in treatment planning: NCCN   09/28/2021 - 10/14/2021 Chemotherapy   Patient is on Treatment Plan : GASTROESOPHAGEAL FOLFOX + Nivolumab q14d     09/28/2021 -  Chemotherapy   Patient is on Treatment Plan : GASTROESOPHAGEAL FOLFOX + Nivolumab q14d       Mackenzie Lewis is seen in the clinic for follow up of her gastric cancer.   ve yearly mammograms.  CBC is normal and CMP reveals a nonfasting blood sugar 130 and the rest is normal.  She has been able to stabilize her weight at this time and she does have some degree of constipation so takes MiraLAX fairly regularly.  I recommended she go to daily.  I also recommended that she increase her omeprazole to twice daily as we prepare to start treatment.  She tells me she is on a medication at bedtime to help her sleep but did not know the name of this medication, but I will try to find out. She has to force herself to eat. She does have Zofran ODT for nausea as needed.  She denies fever, chills, night sweats, or other  signs of infection. She denies cardiorespiratory and gastrointestinal issues. She  denies pain. Her appetite is poor.  Her weight has decreased by over 30 pounds.    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  If that happens, she is to call us so that we can schedule her for an ultrasound to look for synovitis.     Essential hypertension 09/09/2016   Gastric cancer ([facility]) 09/10/2021   Hyperlipidemia 09/01/2016   Hypertension    Hypothyroidism 09/01/2016   Osteoarthritis of both feet 01/20/2016   Osteoarthritis, hand 01/20/2016   Thyroid disease   Degenerative disc disease History of endometriosis  Past Surgical History:  Procedure Laterality Date   APPENDECTOMY  LAPAROSCOPIC APPENDECTOMY N/A 04/10/2016   Procedure: APPENDECTOMY LAPAROSCOPIC;  Surgeon: Coralie Keens, MD;  Location: [facility];  Service: General;  Laterality: N/A;  Bilateral tubal ligation Total hysterectomy and bilateral salpingo-oophorectomy in 1980  Family History  Problem Relation Age of Onset   Hypertension Mother    Prostate cancer Father    AAA (abdominal aortic aneurysm) Brother   Her sister has had breast cancer as well as a tumor of her head Her daughter has had breast cancer last year  Social History:  reports that she has never smoked. She has never used smokeless tobacco. She reports current alcohol use. She reports that she does not use drugs.The patient is accompanied by  her son, daughter-in-law and daughter today.  She is single but lives with a significant other.  She has the 2 children.  She worked in [employer] (medical) and denies any chemical or toxin exposures.  She grew up in [place] and [place].  She is active and healthy, especially for her age.  Allergies:  Allergies  Allergen Reactions   Sulfa Antibiotics Rash    Other reaction(s): Other (See Comments) "Made me feel weird"    Current Medications: Current Outpatient Medications  Medication Sig Dispense Refill   aspirin 81 MG EC tablet Take 81 mg by mouth daily. Swallow whole.     Calcium Carbonate (CALCIUM 600 PO) Take 1 tablet by mouth daily.     Cholecalciferol (VITAMIN D3) 5000 units CAPS Take 1 capsule by mouth daily.     EXFORGE HCT 5-160-12.5 MG TABS Take 1 tablet by mouth daily.     famotidine (PEPCID) 40 MG tablet Take 40 mg by mouth at bedtime.     KRILL OIL PO Take 1 capsule by mouth daily. Unknown strenght     levothyroxine (SYNTHROID, LEVOTHROID) 75 MCG tablet Take 75 mcg by mouth daily before breakfast.      omeprazole (PRILOSEC) 40 MG capsule Take 40 mg by mouth daily.     ondansetron (ZOFRAN) 4 MG tablet Take 4 mg by mouth every 4 (four) hours as needed.     ondansetron (ZOFRAN-ODT) 4 MG disintegrating tablet Take 1 tablet (4 mg total) by mouth every 8 (eight) hours as needed for nausea or vomiting. 90 tablet 0   polyethylene glycol powder (GLYCOLAX/MIRALAX) 17 GM/SCOOP powder SMARTSIG:1 scoopful By Mouth Daily     pravastatin (PRAVACHOL) 20 MG tablet Take 1 tablet (20 mg total) by mouth every evening. 90 tablet 3   Probiotic Product (PROBIOTIC DAILY PO) Take 1 tablet by mouth daily.     prochlorperazine (COMPAZINE) 10 MG tablet Take 1 tablet (10 mg total) by mouth every 6 (six) hours as needed for nausea or vomiting. 90 tablet 3   No current facility-administered medications for this visit.    REVIEW OF SYSTEMS:  Review of Systems  Eyes: Negative.   Respiratory: Negative.     Cardiovascular: Negative.   Gastrointestinal:  Positive for constipation and nausea.       She does have some dysphagia and regurgitation with certain foods.  She has to eat very slowly.  Genitourinary: Negative.    Musculoskeletal: Negative.   Skin: Negative.   Neurological: Negative.   Hematological: Negative.   Psychiatric/Behavioral: Negative.        VITALS:  Blood pressure (!) 141/67, pulse 90, temperature 98.1 F (36.7 C), temperature source Oral, resp. rate 18, height _0  (1.626 m), weight 187 lb 9.6 oz (85.1 kg), SpO2 95 %.  Wt Readings from Last 3  Encounters:  10/26/21 189 lb 0.6 oz (85.7 kg)  10/22/21 187 lb (84.8 kg)  10/12/21 188 lb 1.9 oz (85.3 kg)    Body mass index is 32.2 kg/m.  Performance status (ECOG): 1 - Symptomatic but completely ambulatory  PHYSICAL EXAM:  Physical Exam Constitutional:      Appearance: Normal appearance.  HENT:     Head: Normocephalic and atraumatic.     Nose: Nose normal.     Mouth/Throat:     Pharynx: Oropharynx is clear.  Eyes:     Extraocular Movements: Extraocular movements intact.     Conjunctiva/sclera: Conjunctivae normal.     Pupils: Pupils are equal, round, and reactive to light.  Cardiovascular:     Rate and Rhythm: Normal rate and regular rhythm.     Heart sounds: Normal heart sounds.  Pulmonary:     Effort: Pulmonary effort is normal.     Breath sounds: Normal breath sounds.  Abdominal:     General: Bowel sounds are normal.     Palpations: Abdomen is soft. There is mass.     Comments: She has a long mass effect in the epigastric area measuring at least 12 to 15 cm long and hard to palpation  Musculoskeletal:        General: Normal range of motion.     Cervical back: Normal range of motion and neck supple.  Skin:    General: Skin is warm and dry.  Neurological:     General: No focal deficit present.     Mental Status: She is alert and oriented to person, place, and time.  Psychiatric:        Mood and  Affect: Mood normal.        Behavior: Behavior normal.        Thought Content: Thought content normal.        Judgment: Judgment normal.     LABS:      Latest Ref Rng & Units 10/22/2021   12:00 AM 10/08/2021   12:00 AM 09/21/2021   12:00 AM  CBC  WBC  7.2     7.6     10.7      Hemoglobin 12.0 - 16.0 13.8     13.7     14.3      Hematocrit 36 - 46 40     39     42      Platelets 150 - 400 K/uL 214     241     197         This result is from an external source.      Latest Ref Rng & Units 10/22/2021   12:00 AM 10/08/2021   12:00 AM 09/21/2021   12:00 AM  CMP  BUN 4 - _0 Creatinine 0.5 - 1.1 0.7     0.9     0.8      Sodium 137 - 147 136     137     138      Potassium 3.5 - 5.1 mEq/L 3.5     3.5     3.6      Chloride 99 - 108 103     105     104      CO2 13 - _1 Calcium 8.7 - 10.7 9.1  9.0     9.1      Alkaline Phos 25 - 125 74     64     72      AST 13 - 35 _0 ALT 7 - 35 U/L _1 This result is from an external source.     Lab Results  Component Value Date   CEA1 2.9 09/10/2021   /  CEA  Date Value Ref Range Status  09/10/2021 2.9 0.0 - 4.7 ng/mL Final    Comment:    (NOTE)                             Nonsmokers          <3.9                             Smokers             <5.6 Roche [employer] Electrochemiluminescence Immunoassay (ECLIA) Values obtained with different assay methods or kits cannot be used interchangeably.  Results cannot be interpreted as absolute evidence of the presence or absence of malignant disease. Performed At: [facility] [place], [place] [id number] Rush Farmer MD VH:[id number]    No results found for: "PSA1" No results found for: "940 096 2605" No results found for: "CAN125"  No results found for: "TOTALPROTELP", "ALBUMINELP", "A1GS", "A2GS", "BETS", "BETA2SER", "GAMS", "MSPIKE", "SPEI" No results found for: "TIBC", "FERRITIN",  "IRONPCTSAT" No results found for: "LDH"  STUDIES:  No results found.

## 2021-10-09 MED FILL — Leucovorin Calcium For Inj 350 MG: INTRAMUSCULAR | Qty: 31.5 | Status: AC

## 2021-10-09 MED FILL — Dexamethasone Sodium Phosphate Inj 100 MG/10ML: INTRAMUSCULAR | Qty: 1 | Status: AC

## 2021-10-09 MED FILL — Nivolumab IV Soln 100 MG/10ML: INTRAVENOUS | Qty: 24 | Status: AC

## 2021-10-09 MED FILL — Fluorouracil IV Soln 5 GM/100ML (50 MG/ML): INTRAVENOUS | Qty: 76 | Status: AC

## 2021-10-09 MED FILL — Fluorouracil IV Soln 2.5 GM/50ML (50 MG/ML): INTRAVENOUS | Qty: 13 | Status: AC

## 2021-10-09 MED FILL — Oxaliplatin IV Soln 100 MG/20ML: INTRAVENOUS | Qty: 27 | Status: AC

## 2021-10-10 LAB — T4: T4, Total: 9.2 ug/dL (ref 4.5–12.0)

## 2021-10-12 ENCOUNTER — Inpatient Hospital Stay: Payer: Medicare Other

## 2021-10-12 VITALS — BP 135/70 | HR 84 | Temp 98.3°F | Resp 18 | Ht 64.0 in | Wt 188.1 lb

## 2021-10-12 DIAGNOSIS — Z79899 Other long term (current) drug therapy: Secondary | ICD-10-CM | POA: Diagnosis not present

## 2021-10-12 DIAGNOSIS — C168 Malignant neoplasm of overlapping sites of stomach: Secondary | ICD-10-CM

## 2021-10-12 DIAGNOSIS — C169 Malignant neoplasm of stomach, unspecified: Secondary | ICD-10-CM | POA: Diagnosis not present

## 2021-10-12 DIAGNOSIS — Z5111 Encounter for antineoplastic chemotherapy: Secondary | ICD-10-CM | POA: Diagnosis not present

## 2021-10-12 MED ORDER — PALONOSETRON HCL INJECTION 0.25 MG/5ML
0.2500 mg | Freq: Once | INTRAVENOUS | Status: AC
  Administered 2021-10-12: 0.25 mg via INTRAVENOUS
  Filled 2021-10-12: qty 5

## 2021-10-12 MED ORDER — DEXTROSE 5 % IV SOLN: Freq: Once | INTRAVENOUS | Status: AC

## 2021-10-12 MED ORDER — SODIUM CHLORIDE 0.9 % IV SOLN
10.0000 mg | Freq: Once | INTRAVENOUS | Status: AC
  Administered 2021-10-12: 10 mg via INTRAVENOUS
  Filled 2021-10-12: qty 10

## 2021-10-12 MED ORDER — LEUCOVORIN CALCIUM INJECTION 350 MG
320.0000 mg/m2 | Freq: Once | INTRAVENOUS | Status: AC
  Administered 2021-10-12: 630 mg via INTRAVENOUS
  Filled 2021-10-12: qty 31.5

## 2021-10-12 MED ORDER — FLUOROURACIL CHEMO INJECTION 2.5 GM/50ML
320.0000 mg/m2 | Freq: Once | INTRAVENOUS | Status: AC
  Administered 2021-10-12: 650 mg via INTRAVENOUS
  Filled 2021-10-12: qty 13

## 2021-10-12 MED ORDER — OXALIPLATIN CHEMO INJECTION 100 MG/20ML
68.0000 mg/m2 | Freq: Once | INTRAVENOUS | Status: AC
  Administered 2021-10-12: 135 mg via INTRAVENOUS
  Filled 2021-10-12: qty 20

## 2021-10-12 MED ORDER — SODIUM CHLORIDE 0.9 % IV SOLN
240.0000 mg | Freq: Once | INTRAVENOUS | Status: AC
  Administered 2021-10-12: 240 mg via INTRAVENOUS
  Filled 2021-10-12: qty 24

## 2021-10-12 MED ORDER — SODIUM CHLORIDE 0.9 % IV SOLN
1920.0000 mg/m2 | INTRAVENOUS | Status: DC
  Administered 2021-10-12: 3800 mg via INTRAVENOUS
  Filled 2021-10-12: qty 76

## 2021-10-12 NOTE — Patient Instructions (Signed)
[facility]  Discharge Instructions: Thank you for choosing [facility] to provide your oncology and hematology care.  If you have a lab appointment with the [facility], please go directly to the [facility] and check in at the registration area.   Wear comfortable clothing and clothing appropriate for easy access to any Portacath or PICC line.   We strive to give you quality time with your provider. You may need to reschedule your appointment if you arrive late (15 or more minutes).  Arriving late affects you and other patients whose appointments are after yours.  Also, if you miss three or more appointments without notifying the office, you may be dismissed from the clinic at the provider's discretion.      For prescription refill requests, have your pharmacy contact our office and allow 72 hours for refills to be completed.    Today you received the following chemotherapy and/or immunotherapy agents      To help prevent nausea and vomiting after your treatment, we encourage you to take your nausea medication as directed.  BELOW ARE SYMPTOMS THAT SHOULD BE REPORTED IMMEDIATELY: *FEVER GREATER THAN 100.4 F (38 C) OR HIGHER *CHILLS OR SWEATING *NAUSEA AND VOMITING THAT IS NOT CONTROLLED WITH YOUR NAUSEA MEDICATION *UNUSUAL SHORTNESS OF BREATH *UNUSUAL BRUISING OR BLEEDING *URINARY PROBLEMS (pain or burning when urinating, or frequent urination) *BOWEL PROBLEMS (unusual diarrhea, constipation, pain near the anus) TENDERNESS IN MOUTH AND THROAT WITH OR WITHOUT PRESENCE OF ULCERS (sore throat, sores in mouth, or a toothache) UNUSUAL RASH, SWELLING OR PAIN  UNUSUAL VAGINAL DISCHARGE OR ITCHING   Items with * indicate a potential emergency and should be followed up as soon as possible or go to the Emergency Department if any problems should occur.  Please show the CHEMOTHERAPY ALERT CARD or IMMUNOTHERAPY ALERT CARD at check-in to the Emergency  Department and triage nurse.  Should you have questions after your visit or need to cancel or reschedule your appointment, please contact [facility]  Dept: [phone]  and follow the prompts.  Office hours are 8:00 a.m. to 4:30 p.m. Monday - Friday. Please note that voicemails left after 4:00 p.m. may not be returned until the following business day.  We are closed weekends and major holidays. You have access to a nurse at all times for urgent questions. Please call the main number to the clinic Dept: [phone] and follow the prompts.  For any non-urgent questions, you may also contact your provider using MyChart. We now offer e-Visits for anyone 60 and older to request care online for non-urgent symptoms. For details visit [email or web address].   Also download the MyChart app! Go to the app store, search "MyChart", open the app, select [facility], and log in with your MyChart username and password.  Masks are optional in the cancer centers. If you would like for your care team to wear a mask while they are taking care of you, please let them know. You may have one support person who is at least 85 years old accompany you for your appointments. Oxaliplatin Injection What is this medication? OXALIPLATIN (ox AL i PLA tin) treats some types of cancer. It works by slowing down the growth of cancer cells. This medicine may be used for other purposes; ask your health care provider or pharmacist if you have questions. COMMON BRAND NAME(S): Eloxatin What should I tell my care team before I take this medication? They need to  know if you have any of these conditions: Heart disease History of irregular heartbeat or rhythm Liver disease Low blood cell levels (white cells, red cells, and platelets) Lung or breathing disease, such as asthma Take medications that treat or prevent blood clots Tingling of the fingers, toes, or other nerve disorder An unusual or allergic  reaction to oxaliplatin, other medications, foods, dyes, or preservatives If you or your partner are pregnant or trying to get pregnant Breast-feeding How should I use this medication? This medication is injected into a vein. It is given by your care team in a hospital or clinic setting. Talk to your care team about the use of this medication in children. Special care may be needed. Overdosage: If you think you have taken too much of this medicine contact a poison control center or emergency room at once. NOTE: This medicine is only for you. Do not share this medicine with others. What if I miss a dose? Keep appointments for follow-up doses. It is important not to miss a dose. Call your care team if you are unable to keep an appointment. What may interact with this medication? Do not take this medication with any of the following: Cisapride Dronedarone Pimozide Thioridazine This medication may also interact with the following: Aspirin and aspirin-like medications Certain medications that treat or prevent blood clots, such as warfarin, apixaban, dabigatran, and rivaroxaban Cisplatin Cyclosporine Diuretics Medications for infection, such as acyclovir, adefovir, amphotericin B, bacitracin, cidofovir, foscarnet, ganciclovir, gentamicin, pentamidine, vancomycin NSAIDs, medications for pain and inflammation, such as ibuprofen or naproxen Other medications that cause heart rhythm changes Pamidronate Zoledronic acid This list may not describe all possible interactions. Give your health care provider a list of all the medicines, herbs, non-prescription drugs, or dietary supplements you use. Also tell them if you smoke, drink alcohol, or use illegal drugs. Some items may interact with your medicine. What should I watch for while using this medication? Your condition will be monitored carefully while you are receiving this medication. You may need blood work while taking this medication. This  medication may make you feel generally unwell. This is not uncommon as chemotherapy can affect healthy cells as well as cancer cells. Report any side effects. Continue your course of treatment even though you feel ill unless your care team tells you to stop. This medication may increase your risk of getting an infection. Call your care team for advice if you get a fever, chills, sore throat, or other symptoms of a cold or flu. Do not treat yourself. Try to avoid being around people who are sick. Avoid taking medications that contain aspirin, acetaminophen, ibuprofen, naproxen, or ketoprofen unless instructed by your care team. These medications may hide a fever. Be careful brushing or flossing your teeth or using a toothpick because you may get an infection or bleed more easily. If you have any dental work done, tell your dentist you are receiving this medication. This medication can make you more sensitive to cold. Do not drink cold drinks or use ice. Cover exposed skin before coming in contact with cold temperatures or cold objects. When out in cold weather wear warm clothing and cover your mouth and nose to warm the air that goes into your lungs. Tell your care team if you get sensitive to the cold. Talk to your care team if you or your partner are pregnant or think either of you might be pregnant. This medication can cause serious birth defects if taken during pregnancy and for  9 months after the last dose. A negative pregnancy test is required before starting this medication. A reliable form of contraception is recommended while taking this medication and for 9 months after the last dose. Talk to your care team about effective forms of contraception. Do not father a child while taking this medication and for 6 months after the last dose. Use a condom while having sex during this time period. Do not breastfeed while taking this medication and for 3 months after the last dose. This medication may cause  infertility. Talk to your care team if you are concerned about your fertility. What side effects may I notice from receiving this medication? Side effects that you should report to your care team as soon as possible: Allergic reactions--skin rash, itching, hives, swelling of the face, lips, tongue, or throat Bleeding--bloody or black, tar-like stools, vomiting blood or brown material that looks like coffee grounds, red or dark brown urine, small red or purple spots on skin, unusual bruising or bleeding Dry cough, shortness of breath or trouble breathing Heart rhythm changes--fast or irregular heartbeat, dizziness, feeling faint or lightheaded, chest pain, trouble breathing Infection--fever, chills, cough, sore throat, wounds that don't heal, pain or trouble when passing urine, general feeling of discomfort or being unwell Liver injury--right upper belly pain, loss of appetite, nausea, light-colored stool, dark yellow or brown urine, yellowing skin or eyes, unusual weakness or fatigue Low red blood cell level--unusual weakness or fatigue, dizziness, headache, trouble breathing Muscle injury--unusual weakness or fatigue, muscle pain, dark yellow or brown urine, decrease in amount of urine Pain, tingling, or numbness in the hands or feet Sudden and severe headache, confusion, change in vision, seizures, which may be signs of posterior reversible encephalopathy syndrome (PRES) Unusual bruising or bleeding Side effects that usually do not require medical attention (report to your care team if they continue or are bothersome): Diarrhea Nausea Pain, redness, or swelling with sores inside the mouth or throat Unusual weakness or fatigue Vomiting This list may not describe all possible side effects. Call your doctor for medical advice about side effects. You may report side effects to FDA at 1-800-FDA-1088. Where should I keep my medication? This medication is given in a hospital or clinic. It will not  be stored at home. NOTE: This sheet is a summary. It may not cover all possible information. If you have questions about this medicine, talk to your doctor, pharmacist, or health care provider.  2023 Elsevier/Gold Standard (2021-05-29 00:00:00) Leucovorin Injection What is this medication? LEUCOVORIN (loo koe VOR in) prevents side effects from certain medications, such as methotrexate. It works by increasing folate levels. This helps protect healthy cells in your body. It may also be used to treat anemia caused by low levels of folate. It can also be used with fluorouracil, a type of chemotherapy, to treat colorectal cancer. It works by increasing the effects of fluorouracil in the body. This medicine may be used for other purposes; ask your health care provider or pharmacist if you have questions. What should I tell my care team before I take this medication? They need to know if you have any of these conditions: Anemia from low levels of vitamin B12 in the blood An unusual or allergic reaction to leucovorin, folic acid, other medications, foods, dyes, or preservatives Pregnant or trying to get pregnant Breastfeeding How should I use this medication? This medication is injected into a vein or a muscle. It is given by your care team in a hospital  or clinic setting. Talk to your care team about the use of this medication in children. Special care may be needed. Overdosage: If you think you have taken too much of this medicine contact a poison control center or emergency room at once. NOTE: This medicine is only for you. Do not share this medicine with others. What if I miss a dose? Keep appointments for follow-up doses. It is important not to miss your dose. Call your care team if you are unable to keep an appointment. What may interact with this medication? Capecitabine Fluorouracil Phenobarbital Phenytoin Primidone Trimethoprim;sulfamethoxazole This list may not describe all possible  interactions. Give your health care provider a list of all the medicines, herbs, non-prescription drugs, or dietary supplements you use. Also tell them if you smoke, drink alcohol, or use illegal drugs. Some items may interact with your medicine. What should I watch for while using this medication? Your condition will be monitored carefully while you are receiving this medication. This medication may increase the side effects of 5-fluorouracil. Tell your care team if you have diarrhea or mouth sores that do not get better or that get worse. What side effects may I notice from receiving this medication? Side effects that you should report to your care team as soon as possible: Allergic reactions--skin rash, itching, hives, swelling of the face, lips, tongue, or throat This list may not describe all possible side effects. Call your doctor for medical advice about side effects. You may report side effects to FDA at 1-800-FDA-1088. Where should I keep my medication? This medication is given in a hospital or clinic. It will not be stored at home. NOTE: This sheet is a summary. It may not cover all possible information. If you have questions about this medicine, talk to your doctor, pharmacist, or health care provider.  2023 Elsevier/Gold Standard (2021-06-12 00:00:00) Fluorouracil Injection What is this medication? FLUOROURACIL (flure oh YOOR a sil) treats some types of cancer. It works by slowing down the growth of cancer cells. This medicine may be used for other purposes; ask your health care provider or pharmacist if you have questions. COMMON BRAND NAME(S): Adrucil What should I tell my care team before I take this medication? They need to know if you have any of these conditions: Blood disorders Dihydropyrimidine dehydrogenase (DPD) deficiency Infection, such as chickenpox, cold sores, herpes Kidney disease Liver disease Poor nutrition Recent or ongoing radiation therapy An unusual or  allergic reaction to fluorouracil, other medications, foods, dyes, or preservatives If you or your partner are pregnant or trying to get pregnant Breast-feeding How should I use this medication? This medication is injected into a vein. It is administered by your care team in a hospital or clinic setting. Talk to your care team about the use of this medication in children. Special care may be needed. Overdosage: If you think you have taken too much of this medicine contact a poison control center or emergency room at once. NOTE: This medicine is only for you. Do not share this medicine with others. What if I miss a dose? Keep appointments for follow-up doses. It is important not to miss your dose. Call your care team if you are unable to keep an appointment. What may interact with this medication? Do not take this medication with any of the following: Live virus vaccines This medication may also interact with the following: Medications that treat or prevent blood clots, such as warfarin, enoxaparin, dalteparin This list may not describe all possible interactions.  Give your health care provider a list of all the medicines, herbs, non-prescription drugs, or dietary supplements you use. Also tell them if you smoke, drink alcohol, or use illegal drugs. Some items may interact with your medicine. What should I watch for while using this medication? Your condition will be monitored carefully while you are receiving this medication. This medication may make you feel generally unwell. This is not uncommon as chemotherapy can affect healthy cells as well as cancer cells. Report any side effects. Continue your course of treatment even though you feel ill unless your care team tells you to stop. In some cases, you may be given additional medications to help with side effects. Follow all directions for their use. This medication may increase your risk of getting an infection. Call your care team for advice if  you get a fever, chills, sore throat, or other symptoms of a cold or flu. Do not treat yourself. Try to avoid being around people who are sick. This medication may increase your risk to bruise or bleed. Call your care team if you notice any unusual bleeding. Be careful brushing or flossing your teeth or using a toothpick because you may get an infection or bleed more easily. If you have any dental work done, tell your dentist you are receiving this medication. Avoid taking medications that contain aspirin, acetaminophen, ibuprofen, naproxen, or ketoprofen unless instructed by your care team. These medications may hide a fever. Do not treat diarrhea with over the counter products. Contact your care team if you have diarrhea that lasts more than 2 days or if it is severe and watery. This medication can make you more sensitive to the sun. Keep out of the sun. If you cannot avoid being in the sun, wear protective clothing and sunscreen. Do not use sun lamps, tanning beds, or tanning booths. Talk to your care team if you or your partner wish to become pregnant or think you might be pregnant. This medication can cause serious birth defects if taken during pregnancy and for 3 months after the last dose. A reliable form of contraception is recommended while taking this medication and for 3 months after the last dose. Talk to your care team about effective forms of contraception. Do not father a child while taking this medication and for 3 months after the last dose. Use a condom while having sex during this time period. Do not breastfeed while taking this medication. This medication may cause infertility. Talk to your care team if you are concerned about your fertility. What side effects may I notice from receiving this medication? Side effects that you should report to your care team as soon as possible: Allergic reactions--skin rash, itching, hives, swelling of the face, lips, tongue, or throat Heart  attack--pain or tightness in the chest, shoulders, arms, or jaw, nausea, shortness of breath, cold or clammy skin, feeling faint or lightheaded Heart failure--shortness of breath, swelling of the ankles, feet, or hands, sudden weight gain, unusual weakness or fatigue Heart rhythm changes--fast or irregular heartbeat, dizziness, feeling faint or lightheaded, chest pain, trouble breathing High ammonia level--unusual weakness or fatigue, confusion, loss of appetite, nausea, vomiting, seizures Infection--fever, chills, cough, sore throat, wounds that don't heal, pain or trouble when passing urine, general feeling of discomfort or being unwell Low red blood cell level--unusual weakness or fatigue, dizziness, headache, trouble breathing Pain, tingling, or numbness in the hands or feet, muscle weakness, change in vision, confusion or trouble speaking, loss of balance or coordination,  trouble walking, seizures Redness, swelling, and blistering of the skin over hands and feet Severe or prolonged diarrhea Unusual bruising or bleeding Side effects that usually do not require medical attention (report to your care team if they continue or are bothersome): Dry skin Headache Increased tears Nausea Pain, redness, or swelling with sores inside the mouth or throat Sensitivity to light Vomiting This list may not describe all possible side effects. Call your doctor for medical advice about side effects. You may report side effects to FDA at 1-800-FDA-1088. Where should I keep my medication? This medication is given in a hospital or clinic. It will not be stored at home. NOTE: This sheet is a summary. It may not cover all possible information. If you have questions about this medicine, talk to your doctor, pharmacist, or health care provider.  2023 Elsevier/Gold Standard (2021-06-09 00:00:00) Nivolumab Injection What is this medication? NIVOLUMAB (nye VOL ue mab) treats some types of cancer. It works by  helping your immune system slow or stop the spread of cancer cells. It is a monoclonal antibody. This medicine may be used for other purposes; ask your health care provider or pharmacist if you have questions. COMMON BRAND NAME(S): Opdivo What should I tell my care team before I take this medication? They need to know if you have any of these conditions: Allogeneic stem cell transplant (uses someone else's stem cells) Autoimmune diseases, such as Crohn disease, ulcerative colitis, lupus History of chest radiation Nervous system problems, such as Guillain-Barre syndrome or myasthenia gravis Organ transplant An unusual or allergic reaction to nivolumab, other medications, foods, dyes, or preservatives Pregnant or trying to get pregnant Breast-feeding How should I use this medication? This medication is infused into a vein. It is given in a hospital or clinic setting. A special MedGuide will be given to you before each treatment. Be sure to read this information carefully each time. Talk to your care team about the use of this medication in children. While it may be prescribed for children as young as 12 years for selected conditions, precautions do apply. Overdosage: If you think you have taken too much of this medicine contact a poison control center or emergency room at once. NOTE: This medicine is only for you. Do not share this medicine with others. What if I miss a dose? Keep appointments for follow-up doses. It is important not to miss your dose. Call your care team if you are unable to keep an appointment. What may interact with this medication? Interactions have not been studied. This list may not describe all possible interactions. Give your health care provider a list of all the medicines, herbs, non-prescription drugs, or dietary supplements you use. Also tell them if you smoke, drink alcohol, or use illegal drugs. Some items may interact with your medicine. What should I watch for  while using this medication? Your condition will be monitored carefully while you are receiving this medication. You may need blood work while taking this medication. This medication may cause serious skin reactions. They can happen weeks to months after starting the medication. Contact your care team right away if you notice fevers or flu-like symptoms with a rash. The rash may be red or purple and then turn into blisters or peeling of the skin. You may also notice a red rash with swelling of the face, lips, or lymph nodes in your neck or under your arms. Tell your care team right away if you have any change in your eyesight. Talk  to your care team if you are pregnant or think you might be pregnant. A negative pregnancy test is required before starting this medication. A reliable form of contraception is recommended while taking this medication and for 5 months after the last dose. Talk to your care team about effective forms of contraception. Do not breast-feed while taking this medication and for 5 months after the last dose. What side effects may I notice from receiving this medication? Side effects that you should report to your care team as soon as possible: Allergic reactions--skin rash, itching, hives, swelling of the face, lips, tongue, or throat Dry cough, shortness of breath or trouble breathing Eye pain, redness, irritation, or discharge with blurry or decreased vision Heart muscle inflammation--unusual weakness or fatigue, shortness of breath, chest pain, fast or irregular heartbeat, dizziness, swelling of the ankles, feet, or hands Hormone gland problems--headache, sensitivity to light, unusual weakness or fatigue, dizziness, fast or irregular heartbeat, increased sensitivity to cold or heat, excessive sweating, constipation, hair loss, increased thirst or amount of urine, tremors or shaking, irritability Infusion reactions--chest pain, shortness of breath or trouble breathing, feeling  faint or lightheaded Kidney injury (glomerulonephritis)--decrease in the amount of urine, red or dark brown urine, foamy or bubbly urine, swelling of the ankles, hands, or feet Liver injury--right upper belly pain, loss of appetite, nausea, light-colored stool, dark yellow or brown urine, yellowing skin or eyes, unusual weakness or fatigue Pain, tingling, or numbness in the hands or feet, muscle weakness, change in vision, confusion or trouble speaking, loss of balance or coordination, trouble walking, seizures Rash, fever, and swollen lymph nodes Redness, blistering, peeling, or loosening of the skin, including inside the mouth Sudden or severe stomach pain, bloody diarrhea, fever, nausea, vomiting Side effects that usually do not require medical attention (report these to your care team if they continue or are bothersome): Bone, joint, or muscle pain Diarrhea Fatigue Loss of appetite Nausea Skin rash This list may not describe all possible side effects. Call your doctor for medical advice about side effects. You may report side effects to FDA at 1-800-FDA-1088. Where should I keep my medication? This medication is given in a hospital or clinic. It will not be stored at home. NOTE: This sheet is a summary. It may not cover all possible information. If you have questions about this medicine, talk to your doctor, pharmacist, or health care provider.  2023 Elsevier/Gold Standard (2021-01-02 00:00:00)

## 2021-10-14 ENCOUNTER — Inpatient Hospital Stay: Payer: Medicare Other

## 2021-10-14 VITALS — BP 137/69 | HR 88 | Temp 99.4°F | Resp 18

## 2021-10-14 DIAGNOSIS — Z5111 Encounter for antineoplastic chemotherapy: Secondary | ICD-10-CM | POA: Diagnosis not present

## 2021-10-14 DIAGNOSIS — C168 Malignant neoplasm of overlapping sites of stomach: Secondary | ICD-10-CM

## 2021-10-14 DIAGNOSIS — Z79899 Other long term (current) drug therapy: Secondary | ICD-10-CM | POA: Diagnosis not present

## 2021-10-14 DIAGNOSIS — C169 Malignant neoplasm of stomach, unspecified: Secondary | ICD-10-CM | POA: Diagnosis not present

## 2021-10-14 MED ORDER — HEPARIN SOD (PORK) LOCK FLUSH 100 UNIT/ML IV SOLN
500.0000 [IU] | Freq: Once | INTRAVENOUS | Status: AC | PRN
  Administered 2021-10-14: 500 [IU]

## 2021-10-14 MED ORDER — SODIUM CHLORIDE 0.9% FLUSH
10.0000 mL | INTRAVENOUS | Status: DC | PRN
  Administered 2021-10-14: 10 mL

## 2021-10-14 NOTE — Patient Instructions (Signed)
Fluorouracil Injection What is this medication? FLUOROURACIL (flure oh YOOR a sil) treats some types of cancer. It works by slowing down the growth of cancer cells. This medicine may be used for other purposes; ask your health care provider or pharmacist if you have questions. COMMON BRAND NAME(S): Adrucil What should I tell my care team before I take this medication? They need to know if you have any of these conditions: Blood disorders Dihydropyrimidine dehydrogenase (DPD) deficiency Infection, such as chickenpox, cold sores, herpes Kidney disease Liver disease Poor nutrition Recent or ongoing radiation therapy An unusual or allergic reaction to fluorouracil, other medications, foods, dyes, or preservatives If you or your partner are pregnant or trying to get pregnant Breast-feeding How should I use this medication? This medication is injected into a vein. It is administered by your care team in a hospital or clinic setting. Talk to your care team about the use of this medication in children. Special care may be needed. Overdosage: If you think you have taken too much of this medicine contact a poison control center or emergency room at once. NOTE: This medicine is only for you. Do not share this medicine with others. What if I miss a dose? Keep appointments for follow-up doses. It is important not to miss your dose. Call your care team if you are unable to keep an appointment. What may interact with this medication? Do not take this medication with any of the following: Live virus vaccines This medication may also interact with the following: Medications that treat or prevent blood clots, such as warfarin, enoxaparin, dalteparin This list may not describe all possible interactions. Give your health care provider a list of all the medicines, herbs, non-prescription drugs, or dietary supplements you use. Also tell them if you smoke, drink alcohol, or use illegal drugs. Some items may  interact with your medicine. What should I watch for while using this medication? Your condition will be monitored carefully while you are receiving this medication. This medication may make you feel generally unwell. This is not uncommon as chemotherapy can affect healthy cells as well as cancer cells. Report any side effects. Continue your course of treatment even though you feel ill unless your care team tells you to stop. In some cases, you may be given additional medications to help with side effects. Follow all directions for their use. This medication may increase your risk of getting an infection. Call your care team for advice if you get a fever, chills, sore throat, or other symptoms of a cold or flu. Do not treat yourself. Try to avoid being around people who are sick. This medication may increase your risk to bruise or bleed. Call your care team if you notice any unusual bleeding. Be careful brushing or flossing your teeth or using a toothpick because you may get an infection or bleed more easily. If you have any dental work done, tell your dentist you are receiving this medication. Avoid taking medications that contain aspirin, acetaminophen, ibuprofen, naproxen, or ketoprofen unless instructed by your care team. These medications may hide a fever. Do not treat diarrhea with over the counter products. Contact your care team if you have diarrhea that lasts more than 2 days or if it is severe and watery. This medication can make you more sensitive to the sun. Keep out of the sun. If you cannot avoid being in the sun, wear protective clothing and sunscreen. Do not use sun lamps, tanning beds, or tanning booths. Talk to   your care team if you or your partner wish to become pregnant or think you might be pregnant. This medication can cause serious birth defects if taken during pregnancy and for 3 months after the last dose. A reliable form of contraception is recommended while taking this  medication and for 3 months after the last dose. Talk to your care team about effective forms of contraception. Do not father a child while taking this medication and for 3 months after the last dose. Use a condom while having sex during this time period. Do not breastfeed while taking this medication. This medication may cause infertility. Talk to your care team if you are concerned about your fertility. What side effects may I notice from receiving this medication? Side effects that you should report to your care team as soon as possible: Allergic reactions--skin rash, itching, hives, swelling of the face, lips, tongue, or throat Heart attack--pain or tightness in the chest, shoulders, arms, or jaw, nausea, shortness of breath, cold or clammy skin, feeling faint or lightheaded Heart failure--shortness of breath, swelling of the ankles, feet, or hands, sudden weight gain, unusual weakness or fatigue Heart rhythm changes--fast or irregular heartbeat, dizziness, feeling faint or lightheaded, chest pain, trouble breathing High ammonia level--unusual weakness or fatigue, confusion, loss of appetite, nausea, vomiting, seizures Infection--fever, chills, cough, sore throat, wounds that don't heal, pain or trouble when passing urine, general feeling of discomfort or being unwell Low red blood cell level--unusual weakness or fatigue, dizziness, headache, trouble breathing Pain, tingling, or numbness in the hands or feet, muscle weakness, change in vision, confusion or trouble speaking, loss of balance or coordination, trouble walking, seizures Redness, swelling, and blistering of the skin over hands and feet Severe or prolonged diarrhea Unusual bruising or bleeding Side effects that usually do not require medical attention (report to your care team if they continue or are bothersome): Dry skin Headache Increased tears Nausea Pain, redness, or swelling with sores inside the mouth or throat Sensitivity  to light Vomiting This list may not describe all possible side effects. Call your doctor for medical advice about side effects. You may report side effects to FDA at 1-800-FDA-1088. Where should I keep my medication? This medication is given in a hospital or clinic. It will not be stored at home. NOTE: This sheet is a summary. It may not cover all possible information. If you have questions about this medicine, talk to your doctor, pharmacist, or health care provider.  2023 Elsevier/Gold Standard (2021-06-09 00:00:00)  

## 2021-10-15 DIAGNOSIS — E785 Hyperlipidemia, unspecified: Secondary | ICD-10-CM | POA: Diagnosis not present

## 2021-10-18 ENCOUNTER — Other Ambulatory Visit: Payer: Self-pay | Admitting: Pharmacist

## 2021-10-18 DIAGNOSIS — C168 Malignant neoplasm of overlapping sites of stomach: Secondary | ICD-10-CM

## 2021-10-20 ENCOUNTER — Other Ambulatory Visit: Payer: Self-pay

## 2021-10-20 ENCOUNTER — Telehealth: Payer: Self-pay

## 2021-10-20 NOTE — Patient Outreach (Signed)
  Care Coordination   Initial Visit Note   10/20/2021 Name: Mackenzie Lewis MRN: [id number] DOB: 12-19-1935  Mackenzie Lewis is a 86 y.o. year old female who sees Prochnau, Chrys Racer, MD for primary care. I spoke with  Osie Bond by phone today.  What matters to the patients health and wellness today?  Placed call to patient and    Goals Addressed               This Visit's Progress     A few questons about my cancer treatment (pt-stated)        Care Coordination Interventions: Assessed patient understanding of cancer diagnosis and recommended treatment plan Reviewed upcoming provider appointments and treatment appointments Assessed available transportation to appointments and treatments. Has consistent/reliable transportation: Yes Assessed support system. Has consistent/reliable family or other support: Yes Encouraged patient to talk to the cancer center about dietician and a local support group. Encouraged patient if she needed my services in the future to let her MD know. Provided my name.          SDOH assessments and interventions completed:  Yes  SDOH Interventions Today    Flowsheet Row Most Recent Value  SDOH Interventions   Transportation Interventions Intervention Not Indicated        Care Coordination Interventions Activated:  Yes  Care Coordination Interventions:  Yes, provided   Follow up plan: No further intervention required.   Encounter Outcome:  Pt. Visit Completed   Tomasa Rand, RN, BSN, CEN [place] Coordinator [phone]

## 2021-10-21 NOTE — Progress Notes (Signed)
[facility]  [address] 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  Stain for HER2 was negative at 0.  PET scan confirmed omental involvement, but no distant metastasis was seen. She is receiving palliative chemotherapy with FOLFOX/nivolumab (capecitbine/oxaliplatin/nivolumab). She is tolerating this well.  She will proceed with a 3rd cycle.  We will plan to see her back in 2 weeks prior to a 4th cycle.   The patient understands the plans discussed today and is in agreement with them.  She knows to contact our office if she develops concerns prior to her next appointment.   I provided 10 minutes of face-to-face time during this encounter and > 50% was spent counseling as documented under my assessment and plan.    Marvia Pickles, PA-C  [facility] [address] [place] [address] Dept: [phone] Dept Fax: [phone]   Orders Placed This Encounter  Procedures   CBC and differential    This external order was created through the Results Console.   CBC    This external order was created through the Results Console.   Basic metabolic panel    This external order was created through the Results Console.   Comprehensive metabolic panel    This external order was created through the Results Console.   Hepatic function panel    This external order was created through the Results Console.      CHIEF COMPLAINT:  CC: Stage IV gastric cancer  Current Treatment:  Palliative FOLFOX/nivolumab  HISTORY OF PRESENT ILLNESS:   Oncology History  Gastric cancer ([place])  09/10/2021 Initial Diagnosis   Gastric  cancer ([place])   09/10/2021 Cancer Staging   Staging form: Stomach, AJCC 8th Edition - Clinical stage from 09/10/2021: Stage IVB (cT4b, cN0, cM1) - Signed by Derwood Kaplan, MD on 09/10/2021 Histopathologic type: Adenocarcinoma, NOS Stage prefix: Initial diagnosis Total positive nodes: 0 Histologic grade (G): G3 Histologic grading system: 3 grade system Sites of metastasis: Peritoneal surface Diagnostic confirmation: Positive histology PLUS positive immunophenotyping and/or positive genetic studies Specimen type: Endoscopy with Biopsy Staged by: Managing physician Carcinoembryonic antigen (CEA) (ng/mL): 2.8 Carbohydrate antigen 19-9 (CA 19-9) (U/mL): 4.9 HER2 status: Unknown Microsatellite instability (MSI): Unknown Tumor location in stomach: Other Clinical staging modalities: Biopsy, Endoscopy Stage used in treatment planning: Yes National guidelines used in treatment planning: Yes Type of national guideline used in treatment planning: NCCN   09/28/2021 - 10/14/2021 Chemotherapy   Patient is on Treatment Plan : GASTROESOPHAGEAL FOLFOX + Nivolumab q14d     09/28/2021 -  Chemotherapy   Patient is on Treatment Plan : GASTROESOPHAGEAL FOLFOX + Nivolumab q14d         INTERVAL HISTORY:  Alois is here today for repeat clinical assessment prior to a 3rd cycle of FOLFOX/nivolumab.  She states she tolerated her 2nd cycle without significant difficulty.  She has had mild nausea, for which medication is effective.  She reports a funny sensation in her jaw when she first starts eating.  This may be due to oxaliplatin, as it can cause pharynolaryngeal dysesthesia.  She states she has had slight lightheadedness today.  She denies other symptoms, such as vision  changes, headache, fever, appetite changes or temporal tenderness. She denies fevers or chills. She denies pain. Her appetite is good. Her weight has decreased 1 pounds over last 2 weeks .  REVIEW OF SYSTEMS:  Review of Systems   Constitutional:  Negative for appetite change, chills, fatigue, fever and unexpected weight change.  HENT:   Negative for lump/mass, mouth sores and sore throat.   Respiratory:  Negative for cough and shortness of breath.   Cardiovascular:  Negative for chest pain and leg swelling.  Gastrointestinal:  Negative for abdominal pain, constipation, diarrhea, nausea and vomiting.  Endocrine: Negative for hot flashes.  Genitourinary:  Negative for difficulty urinating, dysuria, frequency and hematuria.   Musculoskeletal:  Negative for arthralgias, back pain and myalgias.  Skin:  Negative for rash.  Neurological:  Positive for light-headedness. Negative for dizziness and headaches.  Hematological:  Negative for adenopathy. Does not bruise/bleed easily.  Psychiatric/Behavioral:  Negative for depression and sleep disturbance. The patient is not nervous/anxious.      VITALS:  Blood pressure (!) 165/77, pulse 93, temperature 99 F (37.2 C), temperature source Oral, resp. rate 18, height _0  (1.626 m), weight 187 lb (84.8 kg), SpO2 94 %.  Wt Readings from Last 3 Encounters:  10/22/21 187 lb (84.8 kg)  10/12/21 188 lb 1.9 oz (85.3 kg)  10/08/21 187 lb 9.6 oz (85.1 kg)    Body mass index is 32.1 kg/m.  Performance status (ECOG): 1 - Symptomatic but completely ambulatory  PHYSICAL EXAM:  Physical Exam Vitals and nursing note reviewed.  Constitutional:      General: She is not in acute distress.    Appearance: Normal appearance.  HENT:     Head: Normocephalic and atraumatic.     Mouth/Throat:     Mouth: Mucous membranes are moist.     Pharynx: Oropharynx is clear. No oropharyngeal exudate or posterior oropharyngeal erythema.  Eyes:     General: No scleral icterus.    Extraocular Movements: Extraocular movements intact.     Conjunctiva/sclera: Conjunctivae normal.     Pupils: Pupils are equal, round, and reactive to light.  Cardiovascular:     Rate and Rhythm: Normal rate and regular  rhythm.     Heart sounds: Normal heart sounds. No murmur heard.    No friction rub. No gallop.  Pulmonary:     Effort: Pulmonary effort is normal.     Breath sounds: Normal breath sounds. No wheezing, rhonchi or rales.  Abdominal:     General: There is no distension.     Palpations: Abdomen is soft. There is no hepatomegaly, splenomegaly or mass.     Tenderness: There is no abdominal tenderness.  Musculoskeletal:        General: Normal range of motion.     Cervical back: Normal range of motion and neck supple. No tenderness.     Right lower leg: No edema.     Left lower leg: No edema.  Lymphadenopathy:     Cervical: No cervical adenopathy.     Upper Body:     Right upper body: No supraclavicular or axillary adenopathy.     Left upper body: No supraclavicular or axillary adenopathy.     Lower Body: No right inguinal adenopathy. No left inguinal adenopathy.  Skin:    General: Skin is warm and dry.     Coloration: Skin is not jaundiced.     Findings: No rash.  Neurological:     Mental Status: She is alert and oriented to  person, place, and time.     Cranial Nerves: No cranial nerve deficit.  Psychiatric:        Mood and Affect: Mood normal.        Behavior: Behavior normal.        Thought Content: Thought content normal.    LABS:      Latest Ref Rng & Units 10/22/2021   12:00 AM 10/08/2021   12:00 AM 09/21/2021   12:00 AM  CBC  WBC  7.2     7.6     10.7      Hemoglobin 12.0 - 16.0 13.8     13.7     14.3      Hematocrit 36 - 46 40     39     42      Platelets 150 - 400 K/uL 214     241     197         This result is from an external source.      Latest Ref Rng & Units 10/22/2021   12:00 AM 10/08/2021   12:00 AM 09/21/2021   12:00 AM  CMP  BUN 4 - _0 Creatinine 0.5 - 1.1 0.7     0.9     0.8      Sodium 137 - 147 136     137     138      Potassium 3.5 - 5.1 mEq/L 3.5     3.5     3.6      Chloride 99 - 108 103     105     104      CO2 13 - _1 Calcium 8.7 - 10.7 9.1     9.0     9.1      Alkaline Phos 25 - 125 74     64     72      AST 13 - 35 _2 ALT 7 - 35 U/L _3 This result is from an external source.     Lab Results  Component Value Date   CEA1 2.9 09/10/2021   /  CEA  Date Value Ref Range Status  09/10/2021 2.9 0.0 - 4.7 ng/mL Final    Comment:    (NOTE)                             Nonsmokers          <3.9                             Smokers             <5.6 Roche [employer] Electrochemiluminescence Immunoassay (ECLIA) Values obtained with different assay methods or kits cannot be used interchangeably.  Results cannot be interpreted as absolute evidence of the presence or absence of malignant disease. Performed At: [facility] [place], [place] [id number] Rush Farmer MD [id number]    No results found for: "PSA1" No results found for: "IRW431" No results found  for: "ZOX096"  No results found for: "TOTALPROTELP", "ALBUMINELP", "A1GS", "A2GS", "BETS", "BETA2SER", "GAMS", "MSPIKE", "SPEI" No results found for: "TIBC", "FERRITIN", "IRONPCTSAT" No results found for: "LDH"  STUDIES:  No results found.    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  If that happens, she is to call us so that we can schedule her for an ultrasound to look for synovitis.     Essential hypertension 09/09/2016    Gastric cancer ([place]) 09/10/2021   Hyperlipidemia 09/01/2016   Hypertension    Hypothyroidism 09/01/2016   Osteoarthritis of both feet 01/20/2016   Osteoarthritis, hand 01/20/2016   Thyroid disease     Past Surgical History:  Procedure Laterality Date   APPENDECTOMY     LAPAROSCOPIC APPENDECTOMY N/A 04/10/2016   Procedure: APPENDECTOMY LAPAROSCOPIC;  Surgeon: Coralie Keens, MD;  Location: [facility] OR;  Service: General;  Laterality: N/A;    Family History  Problem Relation Age of Onset   Hypertension Mother    Prostate cancer Father    AAA (abdominal aortic aneurysm) Brother     Social History:  reports that she has never smoked. She has never used smokeless tobacco. She reports current alcohol use. She reports that she does not use drugs.The patient is accompanied by her daughter today.  Allergies:  Allergies  Allergen Reactions   Sulfa Antibiotics Rash    Other reaction(s): Other (See Comments) "Made me feel weird"    Current Medications: Current Outpatient Medications  Medication Sig Dispense Refill   aspirin 81 MG EC tablet Take 81 mg by mouth daily. Swallow whole.     Calcium Carbonate (CALCIUM 600 PO) Take 1 tablet by mouth daily.     Cholecalciferol (VITAMIN D3) 5000 units CAPS Take 1 capsule by mouth daily.     EXFORGE HCT 5-160-12.5 MG TABS Take 1 tablet by mouth daily.     famotidine (PEPCID) 40 MG tablet Take 40 mg by mouth at bedtime.     KRILL OIL PO Take 1 capsule by mouth daily. Unknown strenght     levothyroxine (SYNTHROID, LEVOTHROID) 75 MCG tablet Take 75 mcg by mouth daily before breakfast.      omeprazole (PRILOSEC) 40 MG capsule Take 40 mg by mouth daily.     ondansetron (ZOFRAN) 4 MG tablet Take 4 mg by mouth every 4 (four) hours as needed.     ondansetron (ZOFRAN-ODT) 4 MG disintegrating tablet Take 1 tablet (4 mg total) by mouth every 8 (eight) hours as needed for nausea or vomiting. 90 tablet 0   polyethylene glycol powder (GLYCOLAX/MIRALAX) 17 GM/SCOOP  powder SMARTSIG:1 scoopful By Mouth Daily     pravastatin (PRAVACHOL) 20 MG tablet Take 1 tablet (20 mg total) by mouth every evening. 90 tablet 3   Probiotic Product (PROBIOTIC DAILY PO) Take 1 tablet by mouth daily.     prochlorperazine (COMPAZINE) 10 MG tablet Take 1 tablet (10 mg total) by mouth every 6 (six) hours as needed for nausea or vomiting. Madrone  tablet 3   No current facility-administered medications for this visit.

## 2021-10-21 NOTE — Assessment & Plan Note (Addendum)
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  Stain for HER2 was negative at 0.  PET scan confirmed omental involvement, but no distant metastasis was seen. She is receiving palliative chemotherapy with FOLFOX/nivolumab (capecitbine/oxaliplatin/nivolumab). She is tolerating this well. She will proceed with a 3rd cycle.  We will plan to see her back in 2 weeks prior to a 4th cycle. 

## 2021-10-22 ENCOUNTER — Inpatient Hospital Stay: Payer: Medicare Other | Attending: Hematology and Oncology | Admitting: Hematology and Oncology

## 2021-10-22 ENCOUNTER — Inpatient Hospital Stay: Payer: Medicare Other

## 2021-10-22 ENCOUNTER — Encounter: Payer: Self-pay | Admitting: Hematology and Oncology

## 2021-10-22 DIAGNOSIS — C786 Secondary malignant neoplasm of retroperitoneum and peritoneum: Secondary | ICD-10-CM | POA: Diagnosis not present

## 2021-10-22 DIAGNOSIS — Z5111 Encounter for antineoplastic chemotherapy: Secondary | ICD-10-CM | POA: Diagnosis not present

## 2021-10-22 DIAGNOSIS — Z79899 Other long term (current) drug therapy: Secondary | ICD-10-CM | POA: Insufficient documentation

## 2021-10-22 DIAGNOSIS — C169 Malignant neoplasm of stomach, unspecified: Secondary | ICD-10-CM | POA: Insufficient documentation

## 2021-10-22 DIAGNOSIS — C168 Malignant neoplasm of overlapping sites of stomach: Secondary | ICD-10-CM

## 2021-10-22 DIAGNOSIS — D649 Anemia, unspecified: Secondary | ICD-10-CM | POA: Diagnosis not present

## 2021-10-22 LAB — BASIC METABOLIC PANEL
BUN: 15 (ref 4–21)
CO2: 27 — AB (ref 13–22)
Chloride: 103 (ref 99–108)
Creatinine: 0.7 (ref 0.5–1.1)
Glucose: 141
Potassium: 3.5 mEq/L (ref 3.5–5.1)
Sodium: 136 — AB (ref 137–147)

## 2021-10-22 LAB — HEPATIC FUNCTION PANEL
ALT: 19 U/L (ref 7–35)
AST: 23 (ref 13–35)
Alkaline Phosphatase: 74 (ref 25–125)
Bilirubin, Total: 0.4

## 2021-10-22 LAB — CBC: RBC: 4.67 (ref 3.87–5.11)

## 2021-10-22 LAB — CBC AND DIFFERENTIAL
HCT: 40 (ref 36–46)
Hemoglobin: 13.8 (ref 12.0–16.0)
Neutrophils Absolute: 3.67
Platelets: 214 10*3/uL (ref 150–400)
WBC: 7.2

## 2021-10-22 LAB — TSH: TSH: 1.4 u[IU]/mL (ref 0.350–4.500)

## 2021-10-22 LAB — COMPREHENSIVE METABOLIC PANEL
Albumin: 4.1 (ref 3.5–5.0)
Calcium: 9.1 (ref 8.7–10.7)

## 2021-10-23 MED FILL — Leucovorin Calcium For Inj 350 MG: INTRAMUSCULAR | Qty: 31.5 | Status: AC

## 2021-10-23 MED FILL — Dexamethasone Sodium Phosphate Inj 100 MG/10ML: INTRAMUSCULAR | Qty: 1 | Status: AC

## 2021-10-23 MED FILL — Fluorouracil IV Soln 2.5 GM/50ML (50 MG/ML): INTRAVENOUS | Qty: 13 | Status: AC

## 2021-10-23 MED FILL — Nivolumab IV Soln 100 MG/10ML: INTRAVENOUS | Qty: 24 | Status: AC

## 2021-10-23 MED FILL — Oxaliplatin IV Soln 100 MG/20ML: INTRAVENOUS | Qty: 27 | Status: AC

## 2021-10-23 MED FILL — Fluorouracil IV Soln 5 GM/100ML (50 MG/ML): INTRAVENOUS | Qty: 76 | Status: AC

## 2021-10-25 LAB — T4: T4, Total: 11 ug/dL (ref 4.5–12.0)

## 2021-10-26 ENCOUNTER — Inpatient Hospital Stay: Payer: Medicare Other

## 2021-10-26 VITALS — BP 150/79 | HR 93 | Temp 98.1°F | Resp 18 | Wt 189.0 lb

## 2021-10-26 DIAGNOSIS — C786 Secondary malignant neoplasm of retroperitoneum and peritoneum: Secondary | ICD-10-CM | POA: Diagnosis not present

## 2021-10-26 DIAGNOSIS — Z79899 Other long term (current) drug therapy: Secondary | ICD-10-CM | POA: Diagnosis not present

## 2021-10-26 DIAGNOSIS — C169 Malignant neoplasm of stomach, unspecified: Secondary | ICD-10-CM | POA: Diagnosis not present

## 2021-10-26 DIAGNOSIS — Z5111 Encounter for antineoplastic chemotherapy: Secondary | ICD-10-CM | POA: Diagnosis not present

## 2021-10-26 DIAGNOSIS — C168 Malignant neoplasm of overlapping sites of stomach: Secondary | ICD-10-CM

## 2021-10-26 MED ORDER — OXALIPLATIN CHEMO INJECTION 100 MG/20ML
68.0000 mg/m2 | Freq: Once | INTRAVENOUS | Status: AC
  Administered 2021-10-26: 135 mg via INTRAVENOUS
  Filled 2021-10-26: qty 27

## 2021-10-26 MED ORDER — SODIUM CHLORIDE 0.9 % IV SOLN
10.0000 mg | Freq: Once | INTRAVENOUS | Status: AC
  Administered 2021-10-26: 10 mg via INTRAVENOUS
  Filled 2021-10-26: qty 10

## 2021-10-26 MED ORDER — FLUOROURACIL CHEMO INJECTION 2.5 GM/50ML
320.0000 mg/m2 | Freq: Once | INTRAVENOUS | Status: AC
  Administered 2021-10-26: 650 mg via INTRAVENOUS
  Filled 2021-10-26: qty 13

## 2021-10-26 MED ORDER — SODIUM CHLORIDE 0.9 % IV SOLN
1920.0000 mg/m2 | INTRAVENOUS | Status: DC
  Administered 2021-10-26: 3800 mg via INTRAVENOUS
  Filled 2021-10-26: qty 76

## 2021-10-26 MED ORDER — SODIUM CHLORIDE 0.9 % IV SOLN
240.0000 mg | Freq: Once | INTRAVENOUS | Status: AC
  Administered 2021-10-26: 240 mg via INTRAVENOUS
  Filled 2021-10-26: qty 24

## 2021-10-26 MED ORDER — SODIUM CHLORIDE 0.9% FLUSH: 10.0000 mL | INTRAVENOUS | Status: DC | PRN

## 2021-10-26 MED ORDER — LEUCOVORIN CALCIUM INJECTION 350 MG
320.0000 mg/m2 | Freq: Once | INTRAVENOUS | Status: AC
  Administered 2021-10-26: 630 mg via INTRAVENOUS
  Filled 2021-10-26: qty 31.5

## 2021-10-26 MED ORDER — DEXTROSE 5 % IV SOLN: Freq: Once | INTRAVENOUS | Status: AC

## 2021-10-26 MED ORDER — PALONOSETRON HCL INJECTION 0.25 MG/5ML
0.2500 mg | Freq: Once | INTRAVENOUS | Status: AC
  Administered 2021-10-26: 0.25 mg via INTRAVENOUS
  Filled 2021-10-26: qty 5

## 2021-10-26 MED ORDER — HEPARIN SOD (PORK) LOCK FLUSH 100 UNIT/ML IV SOLN: 500.0000 [IU] | Freq: Once | INTRAVENOUS | Status: DC | PRN

## 2021-10-26 NOTE — Patient Instructions (Signed)
[facility]  Discharge Instructions: Thank you for choosing [facility] to provide your oncology and hematology care.  If you have a lab appointment with the [facility], please go directly to the [facility] and check in at the registration area.   Wear comfortable clothing and clothing appropriate for easy access to any Portacath or PICC line.   We strive to give you quality time with your provider. You may need to reschedule your appointment if you arrive late (15 or more minutes).  Arriving late affects you and other patients whose appointments are after yours.  Also, if you miss three or more appointments without notifying the office, you may be dismissed from the clinic at the provider's discretion.      For prescription refill requests, have your pharmacy contact our office and allow 72 hours for refills to be completed.    Today you received the following chemotherapy and/or immunotherapy agents:Nivolumab, Fluorouracil Injection What is this medication? FLUOROURACIL (flure oh YOOR a sil) treats some types of cancer. It works by slowing down the growth of cancer cells. This medicine may be used for other purposes; ask your health care provider or pharmacist if you have questions. COMMON BRAND NAME(S): Adrucil What should I tell my care team before I take this medication? They need to know if you have any of these conditions: Blood disorders Dihydropyrimidine dehydrogenase (DPD) deficiency Infection, such as chickenpox, cold sores, herpes Kidney disease Liver disease Poor nutrition Recent or ongoing radiation therapy An unusual or allergic reaction to fluorouracil, other medications, foods, dyes, or preservatives If you or your partner are pregnant or trying to get pregnant Breast-feeding How should I use this medication? This medication is injected into a vein. It is administered by your care team in a hospital or clinic setting. Talk to  your care team about the use of this medication in children. Special care may be needed. Overdosage: If you think you have taken too much of this medicine contact a poison control center or emergency room at once. NOTE: This medicine is only for you. Do not share this medicine with others. What if I miss a dose? Keep appointments for follow-up doses. It is important not to miss your dose. Call your care team if you are unable to keep an appointment. What may interact with this medication? Do not take this medication with any of the following: Live virus vaccines This medication may also interact with the following: Medications that treat or prevent blood clots, such as warfarin, enoxaparin, dalteparin This list may not describe all possible interactions. Give your health care provider a list of all the medicines, herbs, non-prescription drugs, or dietary supplements you use. Also tell them if you smoke, drink alcohol, or use illegal drugs. Some items may interact with your medicine. What should I watch for while using this medication? Your condition will be monitored carefully while you are receiving this medication. This medication may make you feel generally unwell. This is not uncommon as chemotherapy can affect healthy cells as well as cancer cells. Report any side effects. Continue your course of treatment even though you feel ill unless your care team tells you to stop. In some cases, you may be given additional medications to help with side effects. Follow all directions for their use. This medication may increase your risk of getting an infection. Call your care team for advice if you get a fever, chills, sore throat, or other symptoms of  a cold or flu. Do not treat yourself. Try to avoid being around people who are sick. This medication may increase your risk to bruise or bleed. Call your care team if you notice any unusual bleeding. Be careful brushing or flossing your teeth or using a  toothpick because you may get an infection or bleed more easily. If you have any dental work done, tell your dentist you are receiving this medication. Avoid taking medications that contain aspirin, acetaminophen, ibuprofen, naproxen, or ketoprofen unless instructed by your care team. These medications may hide a fever. Do not treat diarrhea with over the counter products. Contact your care team if you have diarrhea that lasts more than 2 days or if it is severe and watery. This medication can make you more sensitive to the sun. Keep out of the sun. If you cannot avoid being in the sun, wear protective clothing and sunscreen. Do not use sun lamps, tanning beds, or tanning booths. Talk to your care team if you or your partner wish to become pregnant or think you might be pregnant. This medication can cause serious birth defects if taken during pregnancy and for 3 months after the last dose. A reliable form of contraception is recommended while taking this medication and for 3 months after the last dose. Talk to your care team about effective forms of contraception. Do not father a child while taking this medication and for 3 months after the last dose. Use a condom while having sex during this time period. Do not breastfeed while taking this medication. This medication may cause infertility. Talk to your care team if you are concerned about your fertility. What side effects may I notice from receiving this medication? Side effects that you should report to your care team as soon as possible: Allergic reactions--skin rash, itching, hives, swelling of the face, lips, tongue, or throat Heart attack--pain or tightness in the chest, shoulders, arms, or jaw, nausea, shortness of breath, cold or clammy skin, feeling faint or lightheaded Heart failure--shortness of breath, swelling of the ankles, feet, or hands, sudden weight gain, unusual weakness or fatigue Heart rhythm changes--fast or irregular heartbeat,  dizziness, feeling faint or lightheaded, chest pain, trouble breathing High ammonia level--unusual weakness or fatigue, confusion, loss of appetite, nausea, vomiting, seizures Infection--fever, chills, cough, sore throat, wounds that don't heal, pain or trouble when passing urine, general feeling of discomfort or being unwell Low red blood cell level--unusual weakness or fatigue, dizziness, headache, trouble breathing Pain, tingling, or numbness in the hands or feet, muscle weakness, change in vision, confusion or trouble speaking, loss of balance or coordination, trouble walking, seizures Redness, swelling, and blistering of the skin over hands and feet Severe or prolonged diarrhea Unusual bruising or bleeding Side effects that usually do not require medical attention (report to your care team if they continue or are bothersome): Dry skin Headache Increased tears Nausea Pain, redness, or swelling with sores inside the mouth or throat Sensitivity to light Vomiting This list may not describe all possible side effects. Call your doctor for medical advice about side effects. You may report side effects to FDA at 1-800-FDA-1088. Where should I keep my medication? This medication is given in a hospital or clinic. It will not be stored at home. NOTE: This sheet is a summary. It may not cover all possible information. If you have questions about this medicine, talk to your doctor, pharmacist, or health care provider.  2023 Elsevier/Gold Standard (2021-06-09 00:00:00) lLeucovorin Injection What is this  medication? LEUCOVORIN (loo koe VOR in) prevents side effects from certain medications, such as methotrexate. It works by increasing folate levels. This helps protect healthy cells in your body. It may also be used to treat anemia caused by low levels of folate. It can also be used with fluorouracil, a type of chemotherapy, to treat colorectal cancer. It works by increasing the effects of fluorouracil  in the body. This medicine may be used for other purposes; ask your health care provider or pharmacist if you have questions. What should I tell my care team before I take this medication? They need to know if you have any of these conditions: Anemia from low levels of vitamin B12 in the blood An unusual or allergic reaction to leucovorin, folic acid, other medications, foods, dyes, or preservatives Pregnant or trying to get pregnant Breastfeeding How should I use this medication? This medication is injected into a vein or a muscle. It is given by your care team in a hospital or clinic setting. Talk to your care team about the use of this medication in children. Special care may be needed. Overdosage: If you think you have taken too much of this medicine contact a poison control center or emergency room at once. NOTE: This medicine is only for you. Do not share this medicine with others. What if I miss a dose? Keep appointments for follow-up doses. It is important not to miss your dose. Call your care team if you are unable to keep an appointment. What may interact with this medication? Capecitabine Fluorouracil Phenobarbital Phenytoin Primidone Trimethoprim;sulfamethoxazole This list may not describe all possible interactions. Give your health care provider a list of all the medicines, herbs, non-prescription drugs, or dietary supplements you use. Also tell them if you smoke, drink alcohol, or use illegal drugs. Some items may interact with your medicine. What should I watch for while using this medication? Your condition will be monitored carefully while you are receiving this medication. This medication may increase the side effects of 5-fluorouracil. Tell your care team if you have diarrhea or mouth sores that do not get better or that get worse. What side effects may I notice from receiving this medication? Side effects that you should report to your care team as soon as  possible: Allergic reactions--skin rash, itching, hives, swelling of the face, lips, tongue, or throat This list may not describe all possible side effects. Call your doctor for medical advice about side effects. You may report side effects to FDA at 1-800-FDA-1088. Where should I keep my medication? This medication is given in a hospital or clinic. It will not be stored at home. NOTE: This sheet is a summary. It may not cover all possible information. If you have questions about this medicine, talk to your doctor, pharmacist, or health care provider.  2023 Elsevier/Gold Standard (2021-06-12 00:00:00) Oxaliplatin Injection What is this medication? OXALIPLATIN (ox AL i PLA tin) treats some types of cancer. It works by slowing down the growth of cancer cells. This medicine may be used for other purposes; ask your health care provider or pharmacist if you have questions. COMMON BRAND NAME(S): Eloxatin What should I tell my care team before I take this medication? They need to know if you have any of these conditions: Heart disease History of irregular heartbeat or rhythm Liver disease Low blood cell levels (white cells, red cells, and platelets) Lung or breathing disease, such as asthma Take medications that treat or prevent blood clots Tingling of the fingers,  toes, or other nerve disorder An unusual or allergic reaction to oxaliplatin, other medications, foods, dyes, or preservatives If you or your partner are pregnant or trying to get pregnant Breast-feeding How should I use this medication? This medication is injected into a vein. It is given by your care team in a hospital or clinic setting. Talk to your care team about the use of this medication in children. Special care may be needed. Overdosage: If you think you have taken too much of this medicine contact a poison control center or emergency room at once. NOTE: This medicine is only for you. Do not share this medicine with  others. What if I miss a dose? Keep appointments for follow-up doses. It is important not to miss a dose. Call your care team if you are unable to keep an appointment. What may interact with this medication? Do not take this medication with any of the following: Cisapride Dronedarone Pimozide Thioridazine This medication may also interact with the following: Aspirin and aspirin-like medications Certain medications that treat or prevent blood clots, such as warfarin, apixaban, dabigatran, and rivaroxaban Cisplatin Cyclosporine Diuretics Medications for infection, such as acyclovir, adefovir, amphotericin B, bacitracin, cidofovir, foscarnet, ganciclovir, gentamicin, pentamidine, vancomycin NSAIDs, medications for pain and inflammation, such as ibuprofen or naproxen Other medications that cause heart rhythm changes Pamidronate Zoledronic acid This list may not describe all possible interactions. Give your health care provider a list of all the medicines, herbs, non-prescription drugs, or dietary supplements you use. Also tell them if you smoke, drink alcohol, or use illegal drugs. Some items may interact with your medicine. What should I watch for while using this medication? Your condition will be monitored carefully while you are receiving this medication. You may need blood work while taking this medication. This medication may make you feel generally unwell. This is not uncommon as chemotherapy can affect healthy cells as well as cancer cells. Report any side effects. Continue your course of treatment even though you feel ill unless your care team tells you to stop. This medication may increase your risk of getting an infection. Call your care team for advice if you get a fever, chills, sore throat, or other symptoms of a cold or flu. Do not treat yourself. Try to avoid being around people who are sick. Avoid taking medications that contain aspirin, acetaminophen, ibuprofen, naproxen, or  ketoprofen unless instructed by your care team. These medications may hide a fever. Be careful brushing or flossing your teeth or using a toothpick because you may get an infection or bleed more easily. If you have any dental work done, tell your dentist you are receiving this medication. This medication can make you more sensitive to cold. Do not drink cold drinks or use ice. Cover exposed skin before coming in contact with cold temperatures or cold objects. When out in cold weather wear warm clothing and cover your mouth and nose to warm the air that goes into your lungs. Tell your care team if you get sensitive to the cold. Talk to your care team if you or your partner are pregnant or think either of you might be pregnant. This medication can cause serious birth defects if taken during pregnancy and for 9 months after the last dose. A negative pregnancy test is required before starting this medication. A reliable form of contraception is recommended while taking this medication and for 9 months after the last dose. Talk to your care team about effective forms of contraception. Do not  father a child while taking this medication and for 6 months after the last dose. Use a condom while having sex during this time period. Do not breastfeed while taking this medication and for 3 months after the last dose. This medication may cause infertility. Talk to your care team if you are concerned about your fertility. What side effects may I notice from receiving this medication? Side effects that you should report to your care team as soon as possible: Allergic reactions--skin rash, itching, hives, swelling of the face, lips, tongue, or throat Bleeding--bloody or black, tar-like stools, vomiting blood or brown material that looks like coffee grounds, red or dark brown urine, small red or purple spots on skin, unusual bruising or bleeding Dry cough, shortness of breath or trouble breathing Heart rhythm changes--fast  or irregular heartbeat, dizziness, feeling faint or lightheaded, chest pain, trouble breathing Infection--fever, chills, cough, sore throat, wounds that don't heal, pain or trouble when passing urine, general feeling of discomfort or being unwell Liver injury--right upper belly pain, loss of appetite, nausea, light-colored stool, dark yellow or brown urine, yellowing skin or eyes, unusual weakness or fatigue Low red blood cell level--unusual weakness or fatigue, dizziness, headache, trouble breathing Muscle injury--unusual weakness or fatigue, muscle pain, dark yellow or brown urine, decrease in amount of urine Pain, tingling, or numbness in the hands or feet Sudden and severe headache, confusion, change in vision, seizures, which may be signs of posterior reversible encephalopathy syndrome (PRES) Unusual bruising or bleeding Side effects that usually do not require medical attention (report to your care team if they continue or are bothersome): Diarrhea Nausea Pain, redness, or swelling with sores inside the mouth or throat Unusual weakness or fatigue Vomiting This list may not describe all possible side effects. Call your doctor for medical advice about side effects. You may report side effects to FDA at 1-800-FDA-1088. Where should I keep my medication? This medication is given in a hospital or clinic. It will not be stored at home. NOTE: This sheet is a summary. It may not cover all possible information. If you have questions about this medicine, talk to your doctor, pharmacist, or health care provider.  2023 Elsevier/Gold Standard (2021-05-29 00:00:00) Nivolumab Injection What is this medication? NIVOLUMAB (nye VOL ue mab) treats some types of cancer. It works by helping your immune system slow or stop the spread of cancer cells. It is a monoclonal antibody. This medicine may be used for other purposes; ask your health care provider or pharmacist if you have questions. COMMON BRAND  NAME(S): Opdivo What should I tell my care team before I take this medication? They need to know if you have any of these conditions: Allogeneic stem cell transplant (uses someone else's stem cells) Autoimmune diseases, such as Crohn disease, ulcerative colitis, lupus History of chest radiation Nervous system problems, such as Guillain-Barre syndrome or myasthenia gravis Organ transplant An unusual or allergic reaction to nivolumab, other medications, foods, dyes, or preservatives Pregnant or trying to get pregnant Breast-feeding How should I use this medication? This medication is infused into a vein. It is given in a hospital or clinic setting. A special MedGuide will be given to you before each treatment. Be sure to read this information carefully each time. Talk to your care team about the use of this medication in children. While it may be prescribed for children as young as 12 years for selected conditions, precautions do apply. Overdosage: If you think you have taken too much of this medicine  contact a poison control center or emergency room at once. NOTE: This medicine is only for you. Do not share this medicine with others. What if I miss a dose? Keep appointments for follow-up doses. It is important not to miss your dose. Call your care team if you are unable to keep an appointment. What may interact with this medication? Interactions have not been studied. This list may not describe all possible interactions. Give your health care provider a list of all the medicines, herbs, non-prescription drugs, or dietary supplements you use. Also tell them if you smoke, drink alcohol, or use illegal drugs. Some items may interact with your medicine. What should I watch for while using this medication? Your condition will be monitored carefully while you are receiving this medication. You may need blood work while taking this medication. This medication may cause serious skin reactions. They  can happen weeks to months after starting the medication. Contact your care team right away if you notice fevers or flu-like symptoms with a rash. The rash may be red or purple and then turn into blisters or peeling of the skin. You may also notice a red rash with swelling of the face, lips, or lymph nodes in your neck or under your arms. Tell your care team right away if you have any change in your eyesight. Talk to your care team if you are pregnant or think you might be pregnant. A negative pregnancy test is required before starting this medication. A reliable form of contraception is recommended while taking this medication and for 5 months after the last dose. Talk to your care team about effective forms of contraception. Do not breast-feed while taking this medication and for 5 months after the last dose. What side effects may I notice from receiving this medication? Side effects that you should report to your care team as soon as possible: Allergic reactions--skin rash, itching, hives, swelling of the face, lips, tongue, or throat Dry cough, shortness of breath or trouble breathing Eye pain, redness, irritation, or discharge with blurry or decreased vision Heart muscle inflammation--unusual weakness or fatigue, shortness of breath, chest pain, fast or irregular heartbeat, dizziness, swelling of the ankles, feet, or hands Hormone gland problems--headache, sensitivity to light, unusual weakness or fatigue, dizziness, fast or irregular heartbeat, increased sensitivity to cold or heat, excessive sweating, constipation, hair loss, increased thirst or amount of urine, tremors or shaking, irritability Infusion reactions--chest pain, shortness of breath or trouble breathing, feeling faint or lightheaded Kidney injury (glomerulonephritis)--decrease in the amount of urine, red or dark brown urine, foamy or bubbly urine, swelling of the ankles, hands, or feet Liver injury--right upper belly pain, loss of  appetite, nausea, light-colored stool, dark yellow or brown urine, yellowing skin or eyes, unusual weakness or fatigue Pain, tingling, or numbness in the hands or feet, muscle weakness, change in vision, confusion or trouble speaking, loss of balance or coordination, trouble walking, seizures Rash, fever, and swollen lymph nodes Redness, blistering, peeling, or loosening of the skin, including inside the mouth Sudden or severe stomach pain, bloody diarrhea, fever, nausea, vomiting Side effects that usually do not require medical attention (report these to your care team if they continue or are bothersome): Bone, joint, or muscle pain Diarrhea Fatigue Loss of appetite Nausea Skin rash This list may not describe all possible side effects. Call your doctor for medical advice about side effects. You may report side effects to FDA at 1-800-FDA-1088. Where should I keep my medication? This medication is  given in a hospital or clinic. It will not be stored at home. NOTE: This sheet is a summary. It may not cover all possible information. If you have questions about this medicine, talk to your doctor, pharmacist, or health care provider.  2023 Elsevier/Gold Standard (2021-01-02 00:00:00) umab, Oxaliplatin, Luecovorin and flurouracil      To help prevent nausea and vomiting after your treatment, we encourage you to take your nausea medication as directed.  BELOW ARE SYMPTOMS THAT SHOULD BE REPORTED IMMEDIATELY: *FEVER GREATER THAN 100.4 F (38 C) OR HIGHER *CHILLS OR SWEATING *NAUSEA AND VOMITING THAT IS NOT CONTROLLED WITH YOUR NAUSEA MEDICATION *UNUSUAL SHORTNESS OF BREATH *UNUSUAL BRUISING OR BLEEDING *URINARY PROBLEMS (pain or burning when urinating, or frequent urination) *BOWEL PROBLEMS (unusual diarrhea, constipation, pain near the anus) TENDERNESS IN MOUTH AND THROAT WITH OR WITHOUT PRESENCE OF ULCERS (sore throat, sores in mouth, or a toothache) UNUSUAL RASH, SWELLING OR PAIN  UNUSUAL  VAGINAL DISCHARGE OR ITCHING   Items with * indicate a potential emergency and should be followed up as soon as possible or go to the Emergency Department if any problems should occur.  Please show the CHEMOTHERAPY ALERT CARD or IMMUNOTHERAPY ALERT CARD at check-in to the Emergency Department and triage nurse.  Should you have questions after your visit or need to cancel or reschedule your appointment, please contact [facility]  Dept: [phone]  and follow the prompts.  Office hours are 8:00 a.m. to 4:30 p.m. Monday - Friday. Please note that voicemails left after 4:00 p.m. may not be returned until the following business day.  We are closed weekends and major holidays. You have access to a nurse at all times for urgent questions. Please call the main number to the clinic Dept: [phone] and follow the prompts.  For any non-urgent questions, you may also contact your provider using MyChart. We now offer e-Visits for anyone 52 and older to request care online for non-urgent symptoms. For details visit [email or web address].   Also download the MyChart app! Go to the app store, search "MyChart", open the app, select [facility], and log in with your MyChart username and password.  The chemotherapy medication bag should finish at 46 hours.. For example, if your pump is scheduled for 46 hours and it was put on at 4:00 p.m., it should finish at 2:00 p.m. the day it is scheduled to come off regardless of your appointment time.     Estimated time to finish at [facility] on 10/28/21 .   If the display on your pump reads "Low Volume" and it is beeping, take the batteries out of the pump and come to the cancer center for it to be taken off.   If the pump alarms go off prior to the pump reading "Low Volume" then call [phone] and someone can assist you.  If the plunger comes out and the chemotherapy medication is leaking out, please use your home chemo spill kit to clean up  the spill. Do NOT use paper towels or other household products.  If you have problems or questions regarding your pump, please call either [phone] (24 hours a day) or the cancer center Monday-Friday 8:00 a.m.- 4:30 p.m. at the clinic number and we will assist you. If you are unable to get assistance, then go to the nearest Emergency Department and ask the staff to contact the IV team for assistance.    Masks are optional in the cancer centers. If you would like  for your care team to wear a mask while they are taking care of you, please let them know. You may have one support person who is at least 84 years old accompany you for your appointments.

## 2021-10-28 ENCOUNTER — Inpatient Hospital Stay: Payer: Medicare Other

## 2021-10-28 VITALS — BP 105/67 | HR 93 | Temp 98.4°F | Resp 18

## 2021-10-28 DIAGNOSIS — C786 Secondary malignant neoplasm of retroperitoneum and peritoneum: Secondary | ICD-10-CM | POA: Diagnosis not present

## 2021-10-28 DIAGNOSIS — C168 Malignant neoplasm of overlapping sites of stomach: Secondary | ICD-10-CM

## 2021-10-28 DIAGNOSIS — C169 Malignant neoplasm of stomach, unspecified: Secondary | ICD-10-CM | POA: Diagnosis not present

## 2021-10-28 DIAGNOSIS — Z79899 Other long term (current) drug therapy: Secondary | ICD-10-CM | POA: Diagnosis not present

## 2021-10-28 DIAGNOSIS — Z5111 Encounter for antineoplastic chemotherapy: Secondary | ICD-10-CM | POA: Diagnosis not present

## 2021-10-28 MED ORDER — HEPARIN SOD (PORK) LOCK FLUSH 100 UNIT/ML IV SOLN
500.0000 [IU] | Freq: Once | INTRAVENOUS | Status: AC | PRN
  Administered 2021-10-28: 500 [IU]

## 2021-10-28 MED ORDER — SODIUM CHLORIDE 0.9% FLUSH
10.0000 mL | INTRAVENOUS | Status: DC | PRN
  Administered 2021-10-28: 10 mL

## 2021-10-28 NOTE — Progress Notes (Signed)
Patient reports odd sensation in mouth and throat that started last pm and resolved quickly- Mackenzie Lewis D saw patient. Patient denies any symptoms at this time

## 2021-10-28 NOTE — Patient Instructions (Signed)
Fluorouracil Injection What is this medication? FLUOROURACIL (flure oh YOOR a sil) treats some types of cancer. It works by slowing down the growth of cancer cells. This medicine may be used for other purposes; ask your health care provider or pharmacist if you have questions. COMMON BRAND NAME(S): Adrucil What should I tell my care team before I take this medication? They need to know if you have any of these conditions: Blood disorders Dihydropyrimidine dehydrogenase (DPD) deficiency Infection, such as chickenpox, cold sores, herpes Kidney disease Liver disease Poor nutrition Recent or ongoing radiation therapy An unusual or allergic reaction to fluorouracil, other medications, foods, dyes, or preservatives If you or your partner are pregnant or trying to get pregnant Breast-feeding How should I use this medication? This medication is injected into a vein. It is administered by your care team in a hospital or clinic setting. Talk to your care team about the use of this medication in children. Special care may be needed. Overdosage: If you think you have taken too much of this medicine contact a poison control center or emergency room at once. NOTE: This medicine is only for you. Do not share this medicine with others. What if I miss a dose? Keep appointments for follow-up doses. It is important not to miss your dose. Call your care team if you are unable to keep an appointment. What may interact with this medication? Do not take this medication with any of the following: Live virus vaccines This medication may also interact with the following: Medications that treat or prevent blood clots, such as warfarin, enoxaparin, dalteparin This list may not describe all possible interactions. Give your health care provider a list of all the medicines, herbs, non-prescription drugs, or dietary supplements you use. Also tell them if you smoke, drink alcohol, or use illegal drugs. Some items may  interact with your medicine. What should I watch for while using this medication? Your condition will be monitored carefully while you are receiving this medication. This medication may make you feel generally unwell. This is not uncommon as chemotherapy can affect healthy cells as well as cancer cells. Report any side effects. Continue your course of treatment even though you feel ill unless your care team tells you to stop. In some cases, you may be given additional medications to help with side effects. Follow all directions for their use. This medication may increase your risk of getting an infection. Call your care team for advice if you get a fever, chills, sore throat, or other symptoms of a cold or flu. Do not treat yourself. Try to avoid being around people who are sick. This medication may increase your risk to bruise or bleed. Call your care team if you notice any unusual bleeding. Be careful brushing or flossing your teeth or using a toothpick because you may get an infection or bleed more easily. If you have any dental work done, tell your dentist you are receiving this medication. Avoid taking medications that contain aspirin, acetaminophen, ibuprofen, naproxen, or ketoprofen unless instructed by your care team. These medications may hide a fever. Do not treat diarrhea with over the counter products. Contact your care team if you have diarrhea that lasts more than 2 days or if it is severe and watery. This medication can make you more sensitive to the sun. Keep out of the sun. If you cannot avoid being in the sun, wear protective clothing and sunscreen. Do not use sun lamps, tanning beds, or tanning booths. Talk to   your care team if you or your partner wish to become pregnant or think you might be pregnant. This medication can cause serious birth defects if taken during pregnancy and for 3 months after the last dose. A reliable form of contraception is recommended while taking this  medication and for 3 months after the last dose. Talk to your care team about effective forms of contraception. Do not father a child while taking this medication and for 3 months after the last dose. Use a condom while having sex during this time period. Do not breastfeed while taking this medication. This medication may cause infertility. Talk to your care team if you are concerned about your fertility. What side effects may I notice from receiving this medication? Side effects that you should report to your care team as soon as possible: Allergic reactions--skin rash, itching, hives, swelling of the face, lips, tongue, or throat Heart attack--pain or tightness in the chest, shoulders, arms, or jaw, nausea, shortness of breath, cold or clammy skin, feeling faint or lightheaded Heart failure--shortness of breath, swelling of the ankles, feet, or hands, sudden weight gain, unusual weakness or fatigue Heart rhythm changes--fast or irregular heartbeat, dizziness, feeling faint or lightheaded, chest pain, trouble breathing High ammonia level--unusual weakness or fatigue, confusion, loss of appetite, nausea, vomiting, seizures Infection--fever, chills, cough, sore throat, wounds that don't heal, pain or trouble when passing urine, general feeling of discomfort or being unwell Low red blood cell level--unusual weakness or fatigue, dizziness, headache, trouble breathing Pain, tingling, or numbness in the hands or feet, muscle weakness, change in vision, confusion or trouble speaking, loss of balance or coordination, trouble walking, seizures Redness, swelling, and blistering of the skin over hands and feet Severe or prolonged diarrhea Unusual bruising or bleeding Side effects that usually do not require medical attention (report to your care team if they continue or are bothersome): Dry skin Headache Increased tears Nausea Pain, redness, or swelling with sores inside the mouth or throat Sensitivity  to light Vomiting This list may not describe all possible side effects. Call your doctor for medical advice about side effects. You may report side effects to FDA at 1-800-FDA-1088. Where should I keep my medication? This medication is given in a hospital or clinic. It will not be stored at home. NOTE: This sheet is a summary. It may not cover all possible information. If you have questions about this medicine, talk to your doctor, pharmacist, or health care provider.  2023 Elsevier/Gold Standard (2021-06-09 00:00:00)  

## 2021-11-02 ENCOUNTER — Other Ambulatory Visit: Payer: Self-pay | Admitting: Oncology

## 2021-11-02 ENCOUNTER — Encounter: Payer: Self-pay | Admitting: Oncology

## 2021-11-02 DIAGNOSIS — C168 Malignant neoplasm of overlapping sites of stomach: Secondary | ICD-10-CM

## 2021-11-03 NOTE — Progress Notes (Signed)
[facility]  [address] 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  I would consider it as metastatic to the omentum and so that would stage this as a T4 N0 M1, stage IVB.  PET scan did not show evidence of metastatic disease other than the omentum. She is on treatment with chemotherapy of FOLFOX and immunotherapy with nivolumab to improve the success rate. Stain for HER2 was negative at 0. She is tolerating treatment well other than mild nausea and mouth sores.    She is tolerating treatment well other than mild nausea and mouth sores. She will proceed with her FOLFOX + Nivolumab treatment next week. We will then see her back in 2 weeks with CBC,CMP, TSH and T4 for the next cycle. I reviewed all of this with her and her family and answered their questions. She understands and agrees with this plan.  I provided 20 minutes of face-to-face time during this this encounter and > 50% was spent counseling as documented under my assessment and plan.    Derwood Kaplan, MD [facility] [address] [place] [address] Dept: [phone] Dept Fax: [phone]   CHIEF COMPLAINT:  CC: Gastric cancer  Current Treatment: Chemotherapy/immunotherapy   HISTORY OF PRESENT ILLNESS:  Mackenzie Lewis is a 86 y.o. female with a history of gastric cancer who is referred in consultation with Dr. Sandria Senter for assessment and management.  She had noticed that she was having regurgitation when eating and had lost over 30 pounds.  An ultrasound was done, revealing hepatic steatosis, and led to an MRI scan on June 5 which revealed gastric wall thickening with confluent nodularity of the omentum  anterior to the stomach measuring 3.5 cm consistent with metastatic tumor.  She also had low-grade edema and wall thickening extending into the duodenum from the stomach.  She was referred to Dr. Sandria Senter and he did an EGD on July 7.  This revealed a large ulceration measuring 1.2 cm along the greater curvature.  She also had diffusely edematous and erythematous wall with erosions of the antrum and stiff and friable mucosa with oozing of blood.  These findings extended to the gastric fundus as well.  Pathology revealed a poorly differentiated adenocarcinoma with signet ring features from the biopsies of the ulcer as well as the antrum and the fundus of the stomach.  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  She was placed on omeprazole.  Her test for H. pylori was negative.  She was referred to Dr. Kendell Bane for consideration of surgery but he felt this was not resectable because of the extensive involvement and I agree.  I would consider this extending to the duodenum and also metastatic to the omentum.  PET scan confirmed these findings and she wished to pursue systemic intravenous therapy.  She continues to eat and has adjusted her diet to softer foods and liquids and is drinking boost so she has maintained her weight.  She does have some Zofran ODT for nausea when needed.  She has had some anorexia, nausea, occasional vomiting, early satiety, dysphagia, and belching.  A CEA and CA 19-9 were normal. She has been started on FOLFOX chemotherapy along with  immunotherapy.  INTERVAL HISTORY:  I have reviewed her chart and materials related to her cancer extensively and collaborated history with the patient. Summary of oncologic history is as follows: Oncology History  Gastric cancer ([place])  09/10/2021 Initial Diagnosis   Gastric cancer ([place])   09/10/2021 Cancer Staging   Staging form: Stomach, AJCC 8th Edition - Clinical stage from 09/10/2021: Stage  IVB (cT4b, cN0, cM1) - Signed by Derwood Kaplan, MD on 09/10/2021 Histopathologic type: Adenocarcinoma, NOS Stage prefix: Initial diagnosis Total positive nodes: 0 Histologic grade (G): G3 Histologic grading system: 3 grade system Sites of metastasis: Peritoneal surface Diagnostic confirmation: Positive histology PLUS positive immunophenotyping and/or positive genetic studies Specimen type: Endoscopy with Biopsy Staged by: Managing physician Carcinoembryonic antigen (CEA) (ng/mL): 2.8 Carbohydrate antigen 19-9 (CA 19-9) (U/mL): 4.9 HER2 status: Unknown Microsatellite instability (MSI): Unknown Tumor location in stomach: Other Clinical staging modalities: Biopsy, Endoscopy Stage used in treatment planning: Yes National guidelines used in treatment planning: Yes Type of national guideline used in treatment planning: NCCN   09/28/2021 - 10/14/2021 Chemotherapy   Patient is on Treatment Plan : GASTROESOPHAGEAL FOLFOX + Nivolumab q14d     09/28/2021 -  Chemotherapy   Patient is on Treatment Plan : GASTROESOPHAGEAL FOLFOX + Nivolumab q14d      INTERVAL HISTORY Mackenzie Lewis is seen in the clinic for follow up of her gastric cancer. She states she has a sore in her mouth. She notes after her most recent chemotherapy treatment she felt more nauseated. She took ondansetron 4 mg but they suggested her to take the prochlorperazine 10 mg instead. She also noted for 2 nights she couldn't sleep. I will prescribe Lorazepam 0.5 mg as needed for sleep and nausea.  She denies fever, chills, night sweats, or other signs of infection. She denies cardiorespiratory and gastrointestinal issues. She  denies pain. Her appetite is fair.  She has lost 2 pounds since her last visit.     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  If that happens, she is to call us so that we can schedule her for an ultrasound to look for synovitis.     Essential hypertension 09/09/2016   Gastric cancer ([place]) 09/10/2021   Hyperlipidemia 09/01/2016   Hypertension    Hypothyroidism 09/01/2016   Osteoarthritis of both feet 01/20/2016   Osteoarthritis, hand 01/20/2016   Thyroid disease   Degenerative disc disease History of endometriosis  Past Surgical History:  Procedure Laterality Date   APPENDECTOMY     LAPAROSCOPIC APPENDECTOMY N/A 04/10/2016   Procedure: APPENDECTOMY LAPAROSCOPIC;  Surgeon: Coralie Keens, MD;  Location: [facility];  Service: General;  Laterality: N/A;  Bilateral tubal ligation Total hysterectomy and bilateral salpingo-oophorectomy in 1980  Family History  Problem Relation Age of Onset   Hypertension Mother    Prostate cancer Father    AAA (abdominal aortic aneurysm) Brother   Her sister has had breast cancer as well as a tumor of her head Her daughter has had  breast cancer last year  Social History:  reports that she has never smoked. She has never used smokeless tobacco. She reports that she does not currently use alcohol. She reports that she does not use drugs.The patient is accompanied by her son, daughter-in-law and daughter today.  She is single but lives with a significant other.  She has the 2 children.  She worked in [employer] (medical) and denies any chemical or toxin exposures.  She grew up in [place] and [place].  She is active and healthy, especially for her age.  Allergies:  Allergies  Allergen Reactions   Sulfa Antibiotics Rash    Other reaction(s): Other (See  Comments) "Made me feel weird"    Current Medications: Current Outpatient Medications  Medication Sig Dispense Refill   NON FORMULARY MMW: 3 parts Maalox 2 parts Benadryl 1 part viscious lidicaine  Disp. 6oz  Instructions: 2m swish and swallow every 3-4 hours     aspirin 81 MG EC tablet Take 81 mg by mouth daily. Swallow whole.     Calcium Carbonate (CALCIUM 600 PO) Take 1 tablet by mouth daily.     Cholecalciferol (VITAMIN D3) 5000 units CAPS Take 1 capsule by mouth daily.     EXFORGE HCT 5-160-12.5 MG TABS Take 1 tablet by mouth daily.     famotidine (PEPCID) 40 MG tablet Take 40 mg by mouth at bedtime.     KRILL OIL PO Take 1 capsule by mouth daily. Unknown strenght     levothyroxine (SYNTHROID, LEVOTHROID) 75 MCG tablet Take 75 mcg by mouth daily before breakfast.      LORazepam (ATIVAN) 0.5 MG tablet Take 1 tablet (0.5 mg total) by mouth at bedtime. 30 tablet 0   magic mouthwash SOLN SWISH AND SWALLOW 5ML BY MOUTH EVERY THREE TO FOUR HOURS     omeprazole (PRILOSEC) 40 MG capsule Take 40 mg by mouth daily.     ondansetron (ZOFRAN) 4 MG tablet Take 4 mg by mouth every 4 (four) hours as needed.     ondansetron (ZOFRAN-ODT) 4 MG disintegrating tablet Take 1 tablet (4 mg total) by mouth every 8 (eight) hours as needed for nausea or vomiting. 90 tablet 0   polyethylene glycol powder (GLYCOLAX/MIRALAX) 17 GM/SCOOP powder SMARTSIG:1 scoopful By Mouth Daily     pravastatin (PRAVACHOL) 20 MG tablet Take 1 tablet (20 mg total) by mouth every evening. 90 tablet 3   Probiotic Product (PROBIOTIC DAILY PO) Take 1 tablet by mouth daily.     prochlorperazine (COMPAZINE) 10 MG tablet Take 1 tablet (10 mg total) by mouth every 6 (six) hours as needed for nausea or vomiting. 90 tablet 3   No current facility-administered medications for this visit.    REVIEW OF SYSTEMS:  Review of Systems  Eyes: Negative.   Respiratory: Negative.    Cardiovascular: Negative.   Gastrointestinal:  Positive  for constipation and nausea.       She does have some dysphagia and regurgitation with certain foods.  She has to eat very slowly.  Genitourinary: Negative.    Musculoskeletal: Negative.   Skin: Negative.   Neurological: Negative.   Hematological: Negative.   Psychiatric/Behavioral: Negative.        VITALS:  Blood pressure (!) 148/74, pulse 76, temperature 98.2 F (36.8 C), temperature source Oral, resp. rate 16, height _0  (1.626 m), weight 187 lb 14.4 oz (85.2 kg), SpO2 95 %.  Wt Readings from Last 3 Encounters:  11/23/21 188 lb 0.6 oz (85.3 kg)  11/19/21  187 lb 11.2 oz (85.1 kg)  11/11/21 191 lb 4 oz (86.8 kg)    Body mass index is 32.25 kg/m.  Performance status (ECOG): 1 - Symptomatic but completely ambulatory  PHYSICAL EXAM:  Physical Exam Constitutional:      General: She is not in acute distress.    Appearance: Normal appearance. She is not ill-appearing or toxic-appearing.  HENT:     Head: Normocephalic and atraumatic.     Nose: Nose normal.     Mouth/Throat:     Pharynx: Oropharynx is clear.  Eyes:     General:        Right eye: No discharge.        Left eye: No discharge.     Extraocular Movements: Extraocular movements intact.     Conjunctiva/sclera: Conjunctivae normal.     Pupils: Pupils are equal, round, and reactive to light.  Cardiovascular:     Rate and Rhythm: Normal rate and regular rhythm.     Pulses: Normal pulses.     Heart sounds: Normal heart sounds. No murmur heard.    No gallop.  Pulmonary:     Effort: Pulmonary effort is normal. No respiratory distress.     Breath sounds: Normal breath sounds. No wheezing or rales.  Abdominal:     General: Bowel sounds are normal.     Palpations: Abdomen is soft. There is mass.     Comments: She has a long mass effect in the epigastric area measuring at least 12 to 15 cm long and hard to palpation  Musculoskeletal:        General: Normal range of motion.     Cervical back: Normal range of motion and  neck supple.  Skin:    General: Skin is warm and dry.  Neurological:     General: No focal deficit present.     Mental Status: She is alert and oriented to person, place, and time.  Psychiatric:        Mood and Affect: Mood normal.        Behavior: Behavior normal.        Thought Content: Thought content normal.        Judgment: Judgment normal.     LABS:      Latest Ref Rng & Units 11/19/2021   12:00 AM 11/05/2021   12:00 AM 10/22/2021   12:00 AM  CBC  WBC  7.1     8.2     7.2      Hemoglobin 12.0 - 16.0 13.8     13.7     13.8      Hematocrit 36 - 46 38     39     40      Platelets 150 - 400 K/uL 187     210     214         This result is from an external source.      Latest Ref Rng & Units 11/19/2021   12:00 AM 10/22/2021   12:00 AM 10/08/2021   12:00 AM  CMP  BUN 4 - _0 Creatinine 0.5 - 1.1 0.7     0.7     0.9      Sodium 137 - 147 139     136     137      Potassium 3.5 - 5.1 mEq/L 3.6     3.5  3.5      Chloride 99 - 108 106     103     105      CO2 13 - _0 Calcium 8.7 - 10.7 9.2     9.1     9.0      Alkaline Phos 25 - 125 79     74     64      AST 13 - 35 _1 ALT 7 - 35 U/L _2 This result is from an external source.     Lab Results  Component Value Date   CEA1 2.9 09/10/2021   /  CEA  Date Value Ref Range Status  09/10/2021 2.9 0.0 - 4.7 ng/mL Final    Comment:    (NOTE)                             Nonsmokers          <3.9                             Smokers             <5.6 Roche [employer] Electrochemiluminescence Immunoassay (ECLIA) Values obtained with different assay methods or kits cannot be used interchangeably.  Results cannot be interpreted as absolute evidence of the presence or absence of malignant disease. Performed At: [facility] [place], [place] [id number] Rush Farmer MD [id number]    No results found for: "PSA1" No  results found for: "[id number]" No results found for: "CAN125"  No results found for: "TOTALPROTELP", "ALBUMINELP", "A1GS", "A2GS", "BETS", "BETA2SER", "GAMS", "MSPIKE", "SPEI" No results found for: "TIBC", "FERRITIN", "IRONPCTSAT" No results found for: "LDH"  STUDIES:  No results found.  EXAM:09/18/2021 NUCLEAR MEDICINE PET SKULL BASE TO THIGH CLINICAL DATA:  Initial treatment strategy for gastric cancer.   EXAM: NUCLEAR MEDICINE PET SKULL BASE TO THIGH   TECHNIQUE: 9.2 mCi F-18 FDG was injected intravenously. Full-ring PET imaging was performed from the skull base to thigh after the radiotracer. CT data was obtained and used for attenuation correction and anatomic localization.   Fasting blood glucose: 124 mg/dl   COMPARISON:  MRI July 20, 2021 and CT April 10, 2016   FINDINGS: Mediastinal blood pool activity: SUV max 2.4   Liver activity: SUV max NA   NECK: No hypermetabolic cervical adenopathy.   Symmetric hypermetabolic hyperplasia of the tonsils commonly reactive.   Incidental CT findings: none   CHEST: No hypermetabolic thoracic adenopathy.   No hypermetabolic pulmonary nodules or masses.   Incidental CT findings: Aortic atherosclerosis. Calcified mediastinal and right hilar lymph nodes. Motion degraded examination reveals no suspicious pulmonary nodules or masses. Patulous esophagus with a small hiatal hernia.   ABDOMEN/PELVIS: Evaluation of the gastric wall is limited by minimal distension, within this context: Similar diffuse nonspecific gastric wall thickening with no significant change in the appearance of the possible gastric body ulceration seen on image 117/4 and diffuse hypermetabolic activity within the stomach demonstrating a max SUV of 8.6.   Mildly metabolic nodularity anterior and inferior to the greater curvature of the stomach with the largest  of which measures 22 x 13 mm on image 119/4 with a max SUV of 2.3.   No abnormal hypermetabolic  activity within the liver, pancreas, adrenal glands or spleen.   No hypermetabolic abdominopelvic adenopathy.   Incidental CT findings: Similar mild intrahepatic and moderate extrahepatic biliary ductal dilation with the common duct measuring 13 mm. Colonic diverticulosis without findings of acute diverticulitis. Aortic atherosclerosis.   SKELETON: No focal hypermetabolic activity to suggest skeletal metastasis.   Incidental CT findings: Multilevel degenerative changes spine   IMPRESSION: 1. Evaluation of the gastric wall is limited by minimal distension, within this context, here is diffuse hypermetabolic activity within the stomach with similar diffuse nonspecific gastric wall thickening and no significant change in the appearance of the possible anterior gastric body ulceration. 2. Mildly metabolic omental nodularity anterior and inferior to the stomach, likely reflects omental disease involvement. 3. No convincing evidence of hypermetabolic metastatic disease in the neck, chest, or pelvis. 4. Similar mild intrahepatic and moderate extrahepatic biliary ductal dilation with the common duct measuring 13 mm no suspicious hypermetabolic lesion identified within the duct and no discrete lesion identified on prior MRI dated July 20, 2021. 5.  Aortic Atherosclerosis (ICD10-I70.0).        I,Gabriella Ballesteros,acting as a scribe for Derwood Kaplan, MD.,have documented all relevant documentation on the behalf of Derwood Kaplan, MD,as directed by  Derwood Kaplan, MD while in the presence of Derwood Kaplan, MD.

## 2021-11-05 ENCOUNTER — Encounter: Payer: Self-pay | Admitting: Oncology

## 2021-11-05 ENCOUNTER — Other Ambulatory Visit: Payer: Self-pay | Admitting: Oncology

## 2021-11-05 ENCOUNTER — Inpatient Hospital Stay (INDEPENDENT_AMBULATORY_CARE_PROVIDER_SITE_OTHER): Payer: Medicare Other | Admitting: Oncology

## 2021-11-05 ENCOUNTER — Inpatient Hospital Stay: Payer: Medicare Other

## 2021-11-05 VITALS — BP 148/74 | HR 76 | Temp 98.2°F | Resp 16 | Ht 64.0 in | Wt 187.9 lb

## 2021-11-05 DIAGNOSIS — C168 Malignant neoplasm of overlapping sites of stomach: Secondary | ICD-10-CM

## 2021-11-05 DIAGNOSIS — D649 Anemia, unspecified: Secondary | ICD-10-CM | POA: Diagnosis not present

## 2021-11-05 LAB — CBC AND DIFFERENTIAL
HCT: 39 (ref 36–46)
Hemoglobin: 13.7 (ref 12.0–16.0)
Neutrophils Absolute: 4.59
Platelets: 210 10*3/uL (ref 150–400)
WBC: 8.2

## 2021-11-05 LAB — CBC: RBC: 4.57 (ref 3.87–5.11)

## 2021-11-05 MED ORDER — LORAZEPAM 0.5 MG PO TABS: 0.5000 mg | ORAL_TABLET | Freq: Every day | ORAL | 0 refills | Status: DC

## 2021-11-06 MED FILL — Oxaliplatin IV Soln 100 MG/20ML: INTRAVENOUS | Qty: 27 | Status: AC

## 2021-11-06 MED FILL — Nivolumab IV Soln 100 MG/10ML: INTRAVENOUS | Qty: 24 | Status: AC

## 2021-11-06 MED FILL — Fluorouracil IV Soln 2.5 GM/50ML (50 MG/ML): INTRAVENOUS | Qty: 13 | Status: AC

## 2021-11-06 MED FILL — Fluorouracil IV Soln 5 GM/100ML (50 MG/ML): INTRAVENOUS | Qty: 76 | Status: AC

## 2021-11-06 MED FILL — Dexamethasone Sodium Phosphate Inj 100 MG/10ML: INTRAMUSCULAR | Qty: 1 | Status: AC

## 2021-11-06 MED FILL — Leucovorin Calcium For Inj 350 MG: INTRAMUSCULAR | Qty: 31.5 | Status: AC

## 2021-11-06 MED FILL — Fosaprepitant Dimeglumine For IV Infusion 150 MG (Base Eq): INTRAVENOUS | Qty: 5 | Status: AC

## 2021-11-07 ENCOUNTER — Other Ambulatory Visit: Payer: Self-pay

## 2021-11-09 ENCOUNTER — Encounter: Payer: Self-pay | Admitting: Oncology

## 2021-11-09 ENCOUNTER — Inpatient Hospital Stay: Payer: Medicare Other

## 2021-11-09 VITALS — BP 144/72 | HR 74 | Temp 98.9°F | Resp 18 | Ht 64.0 in | Wt 188.0 lb

## 2021-11-09 DIAGNOSIS — C169 Malignant neoplasm of stomach, unspecified: Secondary | ICD-10-CM | POA: Diagnosis not present

## 2021-11-09 DIAGNOSIS — Z79899 Other long term (current) drug therapy: Secondary | ICD-10-CM | POA: Diagnosis not present

## 2021-11-09 DIAGNOSIS — Z5111 Encounter for antineoplastic chemotherapy: Secondary | ICD-10-CM | POA: Diagnosis not present

## 2021-11-09 DIAGNOSIS — C786 Secondary malignant neoplasm of retroperitoneum and peritoneum: Secondary | ICD-10-CM | POA: Diagnosis not present

## 2021-11-09 DIAGNOSIS — C168 Malignant neoplasm of overlapping sites of stomach: Secondary | ICD-10-CM

## 2021-11-09 MED ORDER — SODIUM CHLORIDE 0.9 % IV SOLN
1920.0000 mg/m2 | INTRAVENOUS | Status: DC
  Administered 2021-11-09: 3800 mg via INTRAVENOUS
  Filled 2021-11-09: qty 76

## 2021-11-09 MED ORDER — DEXTROSE 5 % IV SOLN: Freq: Once | INTRAVENOUS | Status: AC

## 2021-11-09 MED ORDER — FLUOROURACIL CHEMO INJECTION 2.5 GM/50ML
320.0000 mg/m2 | Freq: Once | INTRAVENOUS | Status: AC
  Administered 2021-11-09: 650 mg via INTRAVENOUS
  Filled 2021-11-09: qty 13

## 2021-11-09 MED ORDER — LEUCOVORIN CALCIUM INJECTION 350 MG
320.0000 mg/m2 | Freq: Once | INTRAVENOUS | Status: AC
  Administered 2021-11-09: 630 mg via INTRAVENOUS
  Filled 2021-11-09: qty 31.5

## 2021-11-09 MED ORDER — OXALIPLATIN CHEMO INJECTION 100 MG/20ML
68.0000 mg/m2 | Freq: Once | INTRAVENOUS | Status: AC
  Administered 2021-11-09: 135 mg via INTRAVENOUS
  Filled 2021-11-09: qty 20

## 2021-11-09 MED ORDER — SODIUM CHLORIDE 0.9 % IV SOLN
240.0000 mg | Freq: Once | INTRAVENOUS | Status: AC
  Administered 2021-11-09: 240 mg via INTRAVENOUS
  Filled 2021-11-09: qty 24

## 2021-11-09 MED ORDER — PALONOSETRON HCL INJECTION 0.25 MG/5ML
0.2500 mg | Freq: Once | INTRAVENOUS | Status: AC
  Administered 2021-11-09: 0.25 mg via INTRAVENOUS
  Filled 2021-11-09: qty 5

## 2021-11-09 MED ORDER — SODIUM CHLORIDE 0.9 % IV SOLN
150.0000 mg | Freq: Once | INTRAVENOUS | Status: AC
  Administered 2021-11-09: 150 mg via INTRAVENOUS
  Filled 2021-11-09: qty 150

## 2021-11-09 MED ORDER — SODIUM CHLORIDE 0.9 % IV SOLN
10.0000 mg | Freq: Once | INTRAVENOUS | Status: AC
  Administered 2021-11-09: 10 mg via INTRAVENOUS
  Filled 2021-11-09: qty 10

## 2021-11-09 NOTE — Patient Instructions (Signed)
Oxaliplatin Injection What is this medication? OXALIPLATIN (ox AL i PLA tin) treats some types of cancer. It works by slowing down the growth of cancer cells. This medicine may be used for other purposes; ask your health care provider or pharmacist if you have questions. COMMON BRAND NAME(S): Eloxatin What should I tell my care team before I take this medication? They need to know if you have any of these conditions: Heart disease History of irregular heartbeat or rhythm Liver disease Low blood cell levels (white cells, red cells, and platelets) Lung or breathing disease, such as asthma Take medications that treat or prevent blood clots Tingling of the fingers, toes, or other nerve disorder An unusual or allergic reaction to oxaliplatin, other medications, foods, dyes, or preservatives If you or your partner are pregnant or trying to get pregnant Breast-feeding How should I use this medication? This medication is injected into a vein. It is given by your care team in a hospital or clinic setting. Talk to your care team about the use of this medication in children. Special care may be needed. Overdosage: If you think you have taken too much of this medicine contact a poison control center or emergency room at once. NOTE: This medicine is only for you. Do not share this medicine with others. What if I miss a dose? Keep appointments for follow-up doses. It is important not to miss a dose. Call your care team if you are unable to keep an appointment. What may interact with this medication? Do not take this medication with any of the following: Cisapride Dronedarone Pimozide Thioridazine This medication may also interact with the following: Aspirin and aspirin-like medications Certain medications that treat or prevent blood clots, such as warfarin, apixaban, dabigatran, and rivaroxaban Cisplatin Cyclosporine Diuretics Medications for infection, such as acyclovir, adefovir, amphotericin  B, bacitracin, cidofovir, foscarnet, ganciclovir, gentamicin, pentamidine, vancomycin NSAIDs, medications for pain and inflammation, such as ibuprofen or naproxen Other medications that cause heart rhythm changes Pamidronate Zoledronic acid This list may not describe all possible interactions. Give your health care provider a list of all the medicines, herbs, non-prescription drugs, or dietary supplements you use. Also tell them if you smoke, drink alcohol, or use illegal drugs. Some items may interact with your medicine. What should I watch for while using this medication? Your condition will be monitored carefully while you are receiving this medication. You may need blood work while taking this medication. This medication may make you feel generally unwell. This is not uncommon as chemotherapy can affect healthy cells as well as cancer cells. Report any side effects. Continue your course of treatment even though you feel ill unless your care team tells you to stop. This medication may increase your risk of getting an infection. Call your care team for advice if you get a fever, chills, sore throat, or other symptoms of a cold or flu. Do not treat yourself. Try to avoid being around people who are sick. Avoid taking medications that contain aspirin, acetaminophen, ibuprofen, naproxen, or ketoprofen unless instructed by your care team. These medications may hide a fever. Be careful brushing or flossing your teeth or using a toothpick because you may get an infection or bleed more easily. If you have any dental work done, tell your dentist you are receiving this medication. This medication can make you more sensitive to cold. Do not drink cold drinks or use ice. Cover exposed skin before coming in contact with cold temperatures or cold objects. When out in  cold weather wear warm clothing and cover your mouth and nose to warm the air that goes into your lungs. Tell your care team if you get sensitive to  the cold. Talk to your care team if you or your partner are pregnant or think either of you might be pregnant. This medication can cause serious birth defects if taken during pregnancy and for 9 months after the last dose. A negative pregnancy test is required before starting this medication. A reliable form of contraception is recommended while taking this medication and for 9 months after the last dose. Talk to your care team about effective forms of contraception. Do not father a child while taking this medication and for 6 months after the last dose. Use a condom while having sex during this time period. Do not breastfeed while taking this medication and for 3 months after the last dose. This medication may cause infertility. Talk to your care team if you are concerned about your fertility. What side effects may I notice from receiving this medication? Side effects that you should report to your care team as soon as possible: Allergic reactions--skin rash, itching, hives, swelling of the face, lips, tongue, or throat Bleeding--bloody or black, tar-like stools, vomiting blood or brown material that looks like coffee grounds, red or dark brown urine, small red or purple spots on skin, unusual bruising or bleeding Dry cough, shortness of breath or trouble breathing Heart rhythm changes--fast or irregular heartbeat, dizziness, feeling faint or lightheaded, chest pain, trouble breathing Infection--fever, chills, cough, sore throat, wounds that don't heal, pain or trouble when passing urine, general feeling of discomfort or being unwell Liver injury--right upper belly pain, loss of appetite, nausea, light-colored stool, dark yellow or brown urine, yellowing skin or eyes, unusual weakness or fatigue Low red blood cell level--unusual weakness or fatigue, dizziness, headache, trouble breathing Muscle injury--unusual weakness or fatigue, muscle pain, dark yellow or brown urine, decrease in amount of  urine Pain, tingling, or numbness in the hands or feet Sudden and severe headache, confusion, change in vision, seizures, which may be signs of posterior reversible encephalopathy syndrome (PRES) Unusual bruising or bleeding Side effects that usually do not require medical attention (report to your care team if they continue or are bothersome): Diarrhea Nausea Pain, redness, or swelling with sores inside the mouth or throat Unusual weakness or fatigue Vomiting This list may not describe all possible side effects. Call your doctor for medical advice about side effects. You may report side effects to FDA at 1-800-FDA-1088. Where should I keep my medication? This medication is given in a hospital or clinic. It will not be stored at home. NOTE: This sheet is a summary. It may not cover all possible information. If you have questions about this medicine, talk to your doctor, pharmacist, or health care provider.  2023 [employer] Standard (2021-05-29 00:00:00) Leucovorin Injection What is this medication? LEUCOVORIN (loo koe VOR in) prevents side effects from certain medications, such as methotrexate. It works by increasing folate levels. This helps protect healthy cells in your body. It may also be used to treat anemia caused by low levels of folate. It can also be used with fluorouracil, a type of chemotherapy, to treat colorectal cancer. It works by increasing the effects of fluorouracil in the body. This medicine may be used for other purposes; ask your health care provider or pharmacist if you have questions. What should I tell my care team before I take this medication? They need to know if  you have any of these conditions: Anemia from low levels of vitamin B12 in the blood An unusual or allergic reaction to leucovorin, folic acid, other medications, foods, dyes, or preservatives Pregnant or trying to get pregnant Breastfeeding How should I use this medication? This medication is  injected into a vein or a muscle. It is given by your care team in a hospital or clinic setting. Talk to your care team about the use of this medication in children. Special care may be needed. Overdosage: If you think you have taken too much of this medicine contact a poison control center or emergency room at once. NOTE: This medicine is only for you. Do not share this medicine with others. What if I miss a dose? Keep appointments for follow-up doses. It is important not to miss your dose. Call your care team if you are unable to keep an appointment. What may interact with this medication? Capecitabine Fluorouracil Phenobarbital Phenytoin Primidone Trimethoprim;sulfamethoxazole This list may not describe all possible interactions. Give your health care provider a list of all the medicines, herbs, non-prescription drugs, or dietary supplements you use. Also tell them if you smoke, drink alcohol, or use illegal drugs. Some items may interact with your medicine. What should I watch for while using this medication? Your condition will be monitored carefully while you are receiving this medication. This medication may increase the side effects of 5-fluorouracil. Tell your care team if you have diarrhea or mouth sores that do not get better or that get worse. What side effects may I notice from receiving this medication? Side effects that you should report to your care team as soon as possible: Allergic reactions--skin rash, itching, hives, swelling of the face, lips, tongue, or throat This list may not describe all possible side effects. Call your doctor for medical advice about side effects. You may report side effects to FDA at 1-800-FDA-1088. Where should I keep my medication? This medication is given in a hospital or clinic. It will not be stored at home. NOTE: This sheet is a summary. It may not cover all possible information. If you have questions about this medicine, talk to your doctor,  pharmacist, or health care provider.  2023 [employer]/Gold Standard (2021-06-12 00:00:00) Nivolumab Injection What is this medication? NIVOLUMAB (nye VOL ue mab) treats some types of cancer. It works by helping your immune system slow or stop the spread of cancer cells. It is a monoclonal antibody. This medicine may be used for other purposes; ask your health care provider or pharmacist if you have questions. COMMON BRAND NAME(S): Opdivo What should I tell my care team before I take this medication? They need to know if you have any of these conditions: Allogeneic stem cell transplant (uses someone else's stem cells) Autoimmune diseases, such as Crohn disease, ulcerative colitis, lupus History of chest radiation Nervous system problems, such as Guillain-Barre syndrome or myasthenia gravis Organ transplant An unusual or allergic reaction to nivolumab, other medications, foods, dyes, or preservatives Pregnant or trying to get pregnant Breast-feeding How should I use this medication? This medication is infused into a vein. It is given in a hospital or clinic setting. A special MedGuide will be given to you before each treatment. Be sure to read this information carefully each time. Talk to your care team about the use of this medication in children. While it may be prescribed for children as young as 12 years for selected conditions, precautions do apply. Overdosage: If you think you have taken too  much of this medicine contact a poison control center or emergency room at once. NOTE: This medicine is only for you. Do not share this medicine with others. What if I miss a dose? Keep appointments for follow-up doses. It is important not to miss your dose. Call your care team if you are unable to keep an appointment. What may interact with this medication? Interactions have not been studied. This list may not describe all possible interactions. Give your health care provider a list of all the  medicines, herbs, non-prescription drugs, or dietary supplements you use. Also tell them if you smoke, drink alcohol, or use illegal drugs. Some items may interact with your medicine. What should I watch for while using this medication? Your condition will be monitored carefully while you are receiving this medication. You may need blood work while taking this medication. This medication may cause serious skin reactions. They can happen weeks to months after starting the medication. Contact your care team right away if you notice fevers or flu-like symptoms with a rash. The rash may be red or purple and then turn into blisters or peeling of the skin. You may also notice a red rash with swelling of the face, lips, or lymph nodes in your neck or under your arms. Tell your care team right away if you have any change in your eyesight. Talk to your care team if you are pregnant or think you might be pregnant. A negative pregnancy test is required before starting this medication. A reliable form of contraception is recommended while taking this medication and for 5 months after the last dose. Talk to your care team about effective forms of contraception. Do not breast-feed while taking this medication and for 5 months after the last dose. What side effects may I notice from receiving this medication? Side effects that you should report to your care team as soon as possible: Allergic reactions--skin rash, itching, hives, swelling of the face, lips, tongue, or throat Dry cough, shortness of breath or trouble breathing Eye pain, redness, irritation, or discharge with blurry or decreased vision Heart muscle inflammation--unusual weakness or fatigue, shortness of breath, chest pain, fast or irregular heartbeat, dizziness, swelling of the ankles, feet, or hands Hormone gland problems--headache, sensitivity to light, unusual weakness or fatigue, dizziness, fast or irregular heartbeat, increased sensitivity to cold  or heat, excessive sweating, constipation, hair loss, increased thirst or amount of urine, tremors or shaking, irritability Infusion reactions--chest pain, shortness of breath or trouble breathing, feeling faint or lightheaded Kidney injury (glomerulonephritis)--decrease in the amount of urine, red or dark brown urine, foamy or bubbly urine, swelling of the ankles, hands, or feet Liver injury--right upper belly pain, loss of appetite, nausea, light-colored stool, dark yellow or brown urine, yellowing skin or eyes, unusual weakness or fatigue Pain, tingling, or numbness in the hands or feet, muscle weakness, change in vision, confusion or trouble speaking, loss of balance or coordination, trouble walking, seizures Rash, fever, and swollen lymph nodes Redness, blistering, peeling, or loosening of the skin, including inside the mouth Sudden or severe stomach pain, bloody diarrhea, fever, nausea, vomiting Side effects that usually do not require medical attention (report these to your care team if they continue or are bothersome): Bone, joint, or muscle pain Diarrhea Fatigue Loss of appetite Nausea Skin rash This list may not describe all possible side effects. Call your doctor for medical advice about side effects. You may report side effects to FDA at 1-800-FDA-1088. Where should I keep my  medication? This medication is given in a hospital or clinic. It will not be stored at home. NOTE: This sheet is a summary. It may not cover all possible information. If you have questions about this medicine, talk to your doctor, pharmacist, or health care provider.  2023 [employer]/Gold Standard (2021-01-02 00:00:00) Fluorouracil Injection What is this medication? FLUOROURACIL (flure oh YOOR a sil) treats some types of cancer. It works by slowing down the growth of cancer cells. This medicine may be used for other purposes; ask your health care provider or pharmacist if you have questions. COMMON BRAND  NAME(S): Adrucil What should I tell my care team before I take this medication? They need to know if you have any of these conditions: Blood disorders Dihydropyrimidine dehydrogenase (DPD) deficiency Infection, such as chickenpox, cold sores, herpes Kidney disease Liver disease Poor nutrition Recent or ongoing radiation therapy An unusual or allergic reaction to fluorouracil, other medications, foods, dyes, or preservatives If you or your partner are pregnant or trying to get pregnant Breast-feeding How should I use this medication? This medication is injected into a vein. It is administered by your care team in a hospital or clinic setting. Talk to your care team about the use of this medication in children. Special care may be needed. Overdosage: If you think you have taken too much of this medicine contact a poison control center or emergency room at once. NOTE: This medicine is only for you. Do not share this medicine with others. What if I miss a dose? Keep appointments for follow-up doses. It is important not to miss your dose. Call your care team if you are unable to keep an appointment. What may interact with this medication? Do not take this medication with any of the following: Live virus vaccines This medication may also interact with the following: Medications that treat or prevent blood clots, such as warfarin, enoxaparin, dalteparin This list may not describe all possible interactions. Give your health care provider a list of all the medicines, herbs, non-prescription drugs, or dietary supplements you use. Also tell them if you smoke, drink alcohol, or use illegal drugs. Some items may interact with your medicine. What should I watch for while using this medication? Your condition will be monitored carefully while you are receiving this medication. This medication may make you feel generally unwell. This is not uncommon as chemotherapy can affect healthy cells as well as  cancer cells. Report any side effects. Continue your course of treatment even though you feel ill unless your care team tells you to stop. In some cases, you may be given additional medications to help with side effects. Follow all directions for their use. This medication may increase your risk of getting an infection. Call your care team for advice if you get a fever, chills, sore throat, or other symptoms of a cold or flu. Do not treat yourself. Try to avoid being around people who are sick. This medication may increase your risk to bruise or bleed. Call your care team if you notice any unusual bleeding. Be careful brushing or flossing your teeth or using a toothpick because you may get an infection or bleed more easily. If you have any dental work done, tell your dentist you are receiving this medication. Avoid taking medications that contain aspirin, acetaminophen, ibuprofen, naproxen, or ketoprofen unless instructed by your care team. These medications may hide a fever. Do not treat diarrhea with over the counter products. Contact your care team if you have diarrhea  that lasts more than 2 days or if it is severe and watery. This medication can make you more sensitive to the sun. Keep out of the sun. If you cannot avoid being in the sun, wear protective clothing and sunscreen. Do not use sun lamps, tanning beds, or tanning booths. Talk to your care team if you or your partner wish to become pregnant or think you might be pregnant. This medication can cause serious birth defects if taken during pregnancy and for 3 months after the last dose. A reliable form of contraception is recommended while taking this medication and for 3 months after the last dose. Talk to your care team about effective forms of contraception. Do not father a child while taking this medication and for 3 months after the last dose. Use a condom while having sex during this time period. Do not breastfeed while taking this  medication. This medication may cause infertility. Talk to your care team if you are concerned about your fertility. What side effects may I notice from receiving this medication? Side effects that you should report to your care team as soon as possible: Allergic reactions--skin rash, itching, hives, swelling of the face, lips, tongue, or throat Heart attack--pain or tightness in the chest, shoulders, arms, or jaw, nausea, shortness of breath, cold or clammy skin, feeling faint or lightheaded Heart failure--shortness of breath, swelling of the ankles, feet, or hands, sudden weight gain, unusual weakness or fatigue Heart rhythm changes--fast or irregular heartbeat, dizziness, feeling faint or lightheaded, chest pain, trouble breathing High ammonia level--unusual weakness or fatigue, confusion, loss of appetite, nausea, vomiting, seizures Infection--fever, chills, cough, sore throat, wounds that don't heal, pain or trouble when passing urine, general feeling of discomfort or being unwell Low red blood cell level--unusual weakness or fatigue, dizziness, headache, trouble breathing Pain, tingling, or numbness in the hands or feet, muscle weakness, change in vision, confusion or trouble speaking, loss of balance or coordination, trouble walking, seizures Redness, swelling, and blistering of the skin over hands and feet Severe or prolonged diarrhea Unusual bruising or bleeding Side effects that usually do not require medical attention (report to your care team if they continue or are bothersome): Dry skin Headache Increased tears Nausea Pain, redness, or swelling with sores inside the mouth or throat Sensitivity to light Vomiting This list may not describe all possible side effects. Call your doctor for medical advice about side effects. You may report side effects to FDA at 1-800-FDA-1088. Where should I keep my medication? This medication is given in a hospital or clinic. It will not be stored  at home. NOTE: This sheet is a summary. It may not cover all possible information. If you have questions about this medicine, talk to your doctor, pharmacist, or health care provider.  2023 [employer]/Gold Standard (2021-06-09 00:00:00)

## 2021-11-10 ENCOUNTER — Other Ambulatory Visit: Payer: Self-pay

## 2021-11-11 ENCOUNTER — Inpatient Hospital Stay: Payer: TRICARE For Life (TFL)

## 2021-11-11 VITALS — BP 129/81 | HR 77 | Temp 98.6°F | Resp 18 | Ht 64.0 in | Wt 191.2 lb

## 2021-11-11 DIAGNOSIS — Z5111 Encounter for antineoplastic chemotherapy: Secondary | ICD-10-CM | POA: Diagnosis not present

## 2021-11-11 DIAGNOSIS — Z79899 Other long term (current) drug therapy: Secondary | ICD-10-CM | POA: Diagnosis not present

## 2021-11-11 DIAGNOSIS — C169 Malignant neoplasm of stomach, unspecified: Secondary | ICD-10-CM | POA: Diagnosis not present

## 2021-11-11 DIAGNOSIS — C168 Malignant neoplasm of overlapping sites of stomach: Secondary | ICD-10-CM

## 2021-11-11 DIAGNOSIS — C786 Secondary malignant neoplasm of retroperitoneum and peritoneum: Secondary | ICD-10-CM | POA: Diagnosis not present

## 2021-11-11 MED ORDER — SODIUM CHLORIDE 0.9% FLUSH
10.0000 mL | INTRAVENOUS | Status: DC | PRN
  Administered 2021-11-11: 10 mL

## 2021-11-11 MED ORDER — HEPARIN SOD (PORK) LOCK FLUSH 100 UNIT/ML IV SOLN
500.0000 [IU] | Freq: Once | INTRAVENOUS | Status: AC | PRN
  Administered 2021-11-11: 500 [IU]

## 2021-11-11 NOTE — Progress Notes (Signed)
DISCHARGED PATIENT AT 1215.Mackenzie Lewis

## 2021-11-11 NOTE — Patient Instructions (Signed)
The chemotherapy medication bag should finish at 46 hours, 96 hours, or 7 days. For example, if your pump is scheduled for 46 hours and it was put on at 4:00 p.m., it should finish at 2:00 p.m. the day it is scheduled to come off regardless of your appointment time.     Estimated time to finish at 1213.   If the display on your pump reads "Low Volume" and it is beeping, take the batteries out of the pump and come to the cancer center for it to be taken off.   If the pump alarms go off prior to the pump reading "Low Volume" then call [phone] and someone can assist you.  If the plunger comes out and the chemotherapy medication is leaking out, please use your home chemo spill kit to clean up the spill. Do NOT use paper towels or other household products.  If you have problems or questions regarding your pump, please call either [phone] (24 hours a day) or the cancer center Monday-Friday 8:00 a.m.- 4:30 p.m. at the clinic number and we will assist you. If you are unable to get assistance, then go to the nearest Emergency Department and ask the staff to contact the IV team for assistance.

## 2021-11-19 ENCOUNTER — Encounter: Payer: Self-pay | Admitting: Hematology and Oncology

## 2021-11-19 ENCOUNTER — Inpatient Hospital Stay: Payer: Medicare Other | Attending: Hematology and Oncology | Admitting: Hematology and Oncology

## 2021-11-19 ENCOUNTER — Inpatient Hospital Stay: Payer: Medicare Other

## 2021-11-19 DIAGNOSIS — Z5111 Encounter for antineoplastic chemotherapy: Secondary | ICD-10-CM | POA: Diagnosis not present

## 2021-11-19 DIAGNOSIS — C168 Malignant neoplasm of overlapping sites of stomach: Secondary | ICD-10-CM | POA: Diagnosis not present

## 2021-11-19 DIAGNOSIS — C786 Secondary malignant neoplasm of retroperitoneum and peritoneum: Secondary | ICD-10-CM | POA: Diagnosis not present

## 2021-11-19 DIAGNOSIS — D649 Anemia, unspecified: Secondary | ICD-10-CM | POA: Diagnosis not present

## 2021-11-19 DIAGNOSIS — Z79899 Other long term (current) drug therapy: Secondary | ICD-10-CM | POA: Diagnosis not present

## 2021-11-19 DIAGNOSIS — C169 Malignant neoplasm of stomach, unspecified: Secondary | ICD-10-CM | POA: Diagnosis not present

## 2021-11-19 LAB — BASIC METABOLIC PANEL
BUN: 14 (ref 4–21)
CO2: 25 — AB (ref 13–22)
Chloride: 106 (ref 99–108)
Creatinine: 0.7 (ref 0.5–1.1)
Glucose: 150
Potassium: 3.6 mEq/L (ref 3.5–5.1)
Sodium: 139 (ref 137–147)

## 2021-11-19 LAB — COMPREHENSIVE METABOLIC PANEL
Albumin: 4.3 (ref 3.5–5.0)
Calcium: 9.2 (ref 8.7–10.7)

## 2021-11-19 LAB — CBC AND DIFFERENTIAL
HCT: 38 (ref 36–46)
Hemoglobin: 13.8 (ref 12.0–16.0)
Neutrophils Absolute: 3.55
Platelets: 187 10*3/uL (ref 150–400)
WBC: 7.1

## 2021-11-19 LAB — HEPATIC FUNCTION PANEL
ALT: 22 U/L (ref 7–35)
AST: 27 (ref 13–35)
Alkaline Phosphatase: 79 (ref 25–125)
Bilirubin, Total: 0.5

## 2021-11-19 LAB — TSH: TSH: 1.709 u[IU]/mL (ref 0.350–4.500)

## 2021-11-19 LAB — CBC: RBC: 4.46 (ref 3.87–5.11)

## 2021-11-19 NOTE — Progress Notes (Signed)
Mackenzie Lewis  [address] [place],  Mackenzie Lewis  [address] [phone]  Clinic Day:  11/19/2021  Referring physician: Derwood Kaplan, MD  ASSESSMENT & PLAN:   Assessment & Plan: Gastric cancer ([place]) Stage IVB (T4 N0 M1) poorly differentiated adenocarcinoma of the stomach with signet ring features and ulceration, metastatic to the omentum.  Stain for HER2 was negative at 0.  PET scan confirmed omental involvement, but no distant metastasis was seen. She is receiving palliative chemotherapy with FOLFOX/nivolumab (capecitbine/oxaliplatin/nivolumab). She is tolerating this well.  She will proceed with a 5th cycle.  We will plan to see her back in 2 weeks prior to a 6th cycle.    The patient understands the plans discussed today and is in agreement with them.  She knows to contact our office if she develops concerns prior to her next appointment.   I provided 15 minutes of face-to-face time during this encounter and > 50% was spent counseling as documented under my assessment and plan.    Mackenzie Pickles, PA-C  [facility] AT [facility] [address] [place] [address] Dept: [phone] Dept Fax: [phone]   Orders Placed This Encounter  Procedures   CBC and differential    This external order was created through the Results Console.   CBC    This external order was created through the Results Console.   Basic metabolic panel    This external order was created through the Results Console.   Comprehensive metabolic panel    This external order was created through the Results Console.   Hepatic function panel    This external order was created through the Results Console.      CHIEF COMPLAINT:  CC: Stage IVB gastric cancer  Current Treatment: FOLFOX/nivolumab every 14 days  HISTORY OF PRESENT ILLNESS:  Mackenzie Lewis is a 86 y.o. female with a history of gastric cancer who is  referred in consultation with Dr. Sandria Senter for assessment and management.  She had noticed that she was having regurgitation when eating and had lost over 30 pounds.  An ultrasound was done, revealing hepatic steatosis, and led to an MRI scan in June, which revealed gastric wall thickening with confluent nodularity of the omentum anterior to the stomach measuring 3.5 cm consistent with metastatic tumor.  She also had low-grade edema and wall thickening extending into the duodenum from the stomach.  She was referred to Dr. Sandria Senter and he did an EGD in July.  This revealed a large ulceration measuring 1.2 cm along the greater curvature.  She also had diffusely edematous and erythematous wall with erosions of the antrum and stiff and friable mucosa with oozing of blood.  These findings extended to the gastric fundus as well.  Pathology revealed a poorly differentiated adenocarcinoma with signet ring features from the biopsies of the ulcer as well as the antrum and the fundus of the stomach.  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  She was placed on omeprazole.  Her test for H. pylori was negative.  She was referred to Dr. Kendell Bane for consideration of surgery, but he felt this was not resectable because of the extensive involvement.  We consider this extending to the duodenum and metastatic to the omentum.  PET scan confirmed these findings.  She wished to pursue systemic intravenous therapy.  CEA and CA 19-9 were normal. She is receiving  FOLFOX chemotherapy along with immunotherapy and has tolerated this fairly well.  Oncology History  Gastric cancer ([place])  09/10/2021 Initial Diagnosis   Gastric cancer ([place])   09/10/2021 Cancer Staging   Staging form: Stomach, AJCC 8th Edition - Clinical stage from 09/10/2021: Stage IVB (cT4b, cN0, cM1) - Signed by Derwood Kaplan, MD on 09/10/2021 Histopathologic type: Adenocarcinoma, NOS Stage  prefix: Initial diagnosis Total positive nodes: 0 Histologic grade (G): G3 Histologic grading system: 3 grade system Sites of metastasis: Peritoneal surface Diagnostic confirmation: Positive histology PLUS positive immunophenotyping and/or positive genetic studies Specimen type: Endoscopy with Biopsy Staged by: Managing physician Carcinoembryonic antigen (CEA) (ng/mL): 2.8 Carbohydrate antigen 19-9 (CA 19-9) (U/mL): 4.9 HER2 status: Unknown Microsatellite instability (MSI): Unknown Tumor location in stomach: Other Clinical staging modalities: Biopsy, Endoscopy Stage used in treatment planning: Yes National guidelines used in treatment planning: Yes Type of national guideline used in treatment planning: NCCN   09/28/2021 - 10/14/2021 Chemotherapy   Patient is on Treatment Plan : GASTROESOPHAGEAL FOLFOX + Nivolumab q14d     09/28/2021 -  Chemotherapy   Patient is on Treatment Plan : GASTROESOPHAGEAL FOLFOX + Nivolumab q14d         INTERVAL HISTORY:  Mackenzie Lewis is here today for repeat clinical assessment and states she continues to tolerate FOLFOX/nivolumab without significant difficulty.  She is fatigued.  She reports occasional nausea without vomiting, not usually requiring medication.  She reports occasional constipation.  She denies cough, shortness of breath or chest pain.  She denies rashes. She denies fevers or chills. She denies pain. Her appetite is good. Her weight has increased 3 pounds over last 2 weeks .  Due to her personal family history of malignancy, she is seeing the genetic counselor tomorrow.  REVIEW OF SYSTEMS:  Review of Systems  Constitutional:  Negative for appetite change, chills, fatigue, fever and unexpected weight change.  HENT:   Negative for lump/mass, mouth sores and sore throat.   Respiratory:  Negative for cough and shortness of breath.   Cardiovascular:  Negative for chest pain and leg swelling.  Gastrointestinal:  Negative for abdominal pain, constipation,  diarrhea, nausea and vomiting.  Endocrine: Negative for hot flashes.  Genitourinary:  Negative for difficulty urinating, dysuria, frequency and hematuria.   Musculoskeletal:  Negative for arthralgias, back pain and myalgias.  Skin:  Negative for rash.  Neurological:  Negative for dizziness and headaches.  Hematological:  Negative for adenopathy. Does not bruise/bleed easily.  Psychiatric/Behavioral:  Negative for depression and sleep disturbance. The patient is not nervous/anxious.      VITALS:  Blood pressure 134/69, pulse 94, temperature 98.3 F (36.8 C), temperature source Oral, resp. rate 14, height _0  (1.626 m), weight 187 lb 11.2 oz (85.1 kg), SpO2 94 %.  Wt Readings from Last 3 Encounters:  11/19/21 187 lb 11.2 oz (85.1 kg)  11/11/21 191 lb 4 oz (86.8 kg)  11/09/21 188 lb 0.6 oz (85.3 kg)    Body mass index is 32.22 kg/m.  Performance status (ECOG): 1 - Symptomatic but completely ambulatory  PHYSICAL EXAM:  Physical Exam Vitals and nursing note reviewed.  Constitutional:      General: She is not in acute distress.    Appearance: Normal appearance.  HENT:     Head: Normocephalic and atraumatic.     Mouth/Throat:     Mouth: Mucous membranes are moist.     Pharynx: Oropharynx is clear. No oropharyngeal exudate or posterior oropharyngeal erythema.  Eyes:     General:  No scleral icterus.    Extraocular Movements: Extraocular movements intact.     Conjunctiva/sclera: Conjunctivae normal.     Pupils: Pupils are equal, round, and reactive to light.  Cardiovascular:     Rate and Rhythm: Normal rate and regular rhythm.     Heart sounds: Normal heart sounds. No murmur heard.    No friction rub. No gallop.  Pulmonary:     Effort: Pulmonary effort is normal.     Breath sounds: Normal breath sounds. No wheezing, rhonchi or rales.  Abdominal:     General: There is no distension.     Palpations: Abdomen is soft. There is no hepatomegaly, splenomegaly or mass.      Tenderness: There is no abdominal tenderness.  Musculoskeletal:        General: Normal range of motion.     Cervical back: Normal range of motion and neck supple. No tenderness.     Right lower leg: No edema.     Left lower leg: No edema.  Lymphadenopathy:     Cervical: No cervical adenopathy.     Upper Body:     Right upper body: No supraclavicular or axillary adenopathy.     Left upper body: No supraclavicular or axillary adenopathy.     Lower Body: No right inguinal adenopathy. No left inguinal adenopathy.  Skin:    General: Skin is warm and dry.     Coloration: Skin is not jaundiced.     Findings: No rash.  Neurological:     Mental Status: She is alert and oriented to person, place, and time.     Cranial Nerves: No cranial nerve deficit.  Psychiatric:        Mood and Affect: Mood normal.        Behavior: Behavior normal.        Thought Content: Thought content normal.    LABS:      Latest Ref Rng & Units 11/19/2021   12:00 AM 11/05/2021   12:00 AM 10/22/2021   12:00 AM  CBC  WBC  7.1     8.2     7.2      Hemoglobin 12.0 - 16.0 13.8     13.7     13.8      Hematocrit 36 - 46 38     39     40      Platelets 150 - 400 K/uL 187     210     214         This result is from an external source.      Latest Ref Rng & Units 11/19/2021   12:00 AM 10/22/2021   12:00 AM 10/08/2021   12:00 AM  CMP  BUN 4 - _0 Creatinine 0.5 - 1.1 0.7     0.7     0.9      Sodium 137 - 147 139     136     137      Potassium 3.5 - 5.1 mEq/L 3.6     3.5     3.5      Chloride 99 - 108 106     103     105      CO2 13 - _1 Calcium 8.7 - 10.7 9.2     9.1  9.0      Alkaline Phos 25 - 125 79     74     64      AST 13 - 35 _0 ALT 7 - 35 U/L _1 This result is from an external source.     Lab Results  Component Value Date   CEA1 2.9 09/10/2021   /  CEA  Date Value Ref Range Status  09/10/2021 2.9 0.0 - 4.7 ng/mL  Final    Comment:    (NOTE)                             Nonsmokers          <3.9                             Smokers             <5.6 Roche [employer]ostics Electrochemiluminescence Immunoassay (ECLIA) Values obtained with different assay methods or kits cannot be used interchangeably.  Results cannot be interpreted as absolute evidence of the presence or absence of malignant disease. Performed At: [facility] [place], [place] [id number] Rush Farmer MD [id number]    No results found for: "PSA1" No results found for: "[id number]" No results found for: "CAN125"  No results found for: "TOTALPROTELP", "ALBUMINELP", "A1GS", "A2GS", "BETS", "BETA2SER", "GAMS", "MSPIKE", "SPEI" No results found for: "TIBC", "FERRITIN", "IRONPCTSAT" No results found for: "LDH"  STUDIES:  No results found.    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  If that happens, she is to call us so that we can schedule her for an ultrasound to look for synovitis.     Essential hypertension 09/09/2016   Gastric cancer ([place]) 09/10/2021   Hyperlipidemia 09/01/2016   Hypertension    Hypothyroidism 09/01/2016   Osteoarthritis of both feet 01/20/2016   Osteoarthritis, hand 01/20/2016   Thyroid disease     Past Surgical History:  Procedure Laterality Date   APPENDECTOMY     LAPAROSCOPIC APPENDECTOMY N/A 04/10/2016   Procedure: APPENDECTOMY LAPAROSCOPIC;   Surgeon: Coralie Keens, MD;  Location: [facility] OR;  Service: General;  Laterality: N/A;    Family History  Problem Relation Age of Onset   Hypertension Mother    Prostate cancer Father    AAA (abdominal aortic aneurysm) Brother     Social History:  reports that she has never smoked. She has never used smokeless tobacco. She reports that she does not currently use alcohol. She reports that she does not use drugs.The patient is accompanied by her daughter today.  Allergies:  Allergies  Allergen Reactions   Sulfa Antibiotics Rash    Other reaction(s): Other (See Comments) "Made me  feel weird"    Current Medications: Current Outpatient Medications  Medication Sig Dispense Refill   aspirin 81 MG EC tablet Take 81 mg by mouth daily. Swallow whole.     Calcium Carbonate (CALCIUM 600 PO) Take 1 tablet by mouth daily.     Cholecalciferol (VITAMIN D3) 5000 units CAPS Take 1 capsule by mouth daily.     EXFORGE HCT 5-160-12.5 MG TABS Take 1 tablet by mouth daily.     famotidine (PEPCID) 40 MG tablet Take 40 mg by mouth at bedtime.     KRILL OIL PO Take 1 capsule by mouth daily. Unknown strenght     levothyroxine (SYNTHROID, LEVOTHROID) 75 MCG tablet Take 75 mcg by mouth daily before breakfast.      LORazepam (ATIVAN) 0.5 MG tablet Take 1 tablet (0.5 mg total) by mouth at bedtime. 30 tablet 0   magic mouthwash SOLN SWISH AND SWALLOW 5ML BY MOUTH EVERY THREE TO FOUR HOURS     NON FORMULARY MMW: 3 parts Maalox 2 parts Benadryl 1 part viscious lidicaine  Disp. 6oz  Instructions: 56m swish and swallow every 3-4 hours     omeprazole (PRILOSEC) 40 MG capsule Take 40 mg by mouth daily.     ondansetron (ZOFRAN) 4 MG tablet Take 4 mg by mouth every 4 (four) hours as needed.     ondansetron (ZOFRAN-ODT) 4 MG disintegrating tablet Take 1 tablet (4 mg total) by mouth every 8 (eight) hours as needed for nausea or vomiting. 90 tablet 0   polyethylene glycol powder (GLYCOLAX/MIRALAX) 17 GM/SCOOP powder  SMARTSIG:1 scoopful By Mouth Daily     pravastatin (PRAVACHOL) 20 MG tablet Take 1 tablet (20 mg total) by mouth every evening. 90 tablet 3   Probiotic Product (PROBIOTIC DAILY PO) Take 1 tablet by mouth daily.     prochlorperazine (COMPAZINE) 10 MG tablet Take 1 tablet (10 mg total) by mouth every 6 (six) hours as needed for nausea or vomiting. 90 tablet 3   No current facility-administered medications for this visit.

## 2021-11-19 NOTE — Assessment & Plan Note (Signed)
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  Stain for HER2 was negative at 0.  PET scan confirmed omental involvement, but no distant metastasis was seen. She is receiving palliative chemotherapy with FOLFOX/nivolumab (capecitbine/oxaliplatin/nivolumab). She is tolerating this well. She will proceed with a 5th cycle.  We will plan to see her back in 2 weeks prior to a 6th cycle.

## 2021-11-20 ENCOUNTER — Inpatient Hospital Stay: Payer: Medicare Other

## 2021-11-20 ENCOUNTER — Inpatient Hospital Stay (INDEPENDENT_AMBULATORY_CARE_PROVIDER_SITE_OTHER): Payer: Medicare Other | Admitting: Genetic Counselor

## 2021-11-20 ENCOUNTER — Other Ambulatory Visit: Payer: Self-pay | Admitting: Genetic Counselor

## 2021-11-20 DIAGNOSIS — Z803 Family history of malignant neoplasm of breast: Secondary | ICD-10-CM

## 2021-11-20 DIAGNOSIS — Z8042 Family history of malignant neoplasm of prostate: Secondary | ICD-10-CM

## 2021-11-20 DIAGNOSIS — C168 Malignant neoplasm of overlapping sites of stomach: Secondary | ICD-10-CM

## 2021-11-20 DIAGNOSIS — C169 Malignant neoplasm of stomach, unspecified: Secondary | ICD-10-CM | POA: Diagnosis not present

## 2021-11-20 LAB — T4: T4, Total: 12.7 ug/dL — ABNORMAL HIGH (ref 4.5–12.0)

## 2021-11-20 MED FILL — Fluorouracil IV Soln 2.5 GM/50ML (50 MG/ML): INTRAVENOUS | Qty: 13 | Status: AC

## 2021-11-20 MED FILL — Fluorouracil IV Soln 5 GM/100ML (50 MG/ML): INTRAVENOUS | Qty: 76 | Status: AC

## 2021-11-20 MED FILL — Leucovorin Calcium For Inj 350 MG: INTRAMUSCULAR | Qty: 31.5 | Status: AC

## 2021-11-20 MED FILL — Fosaprepitant Dimeglumine For IV Infusion 150 MG (Base Eq): INTRAVENOUS | Qty: 5 | Status: AC

## 2021-11-20 MED FILL — Nivolumab IV Soln 100 MG/10ML: INTRAVENOUS | Qty: 24 | Status: AC

## 2021-11-20 MED FILL — Oxaliplatin IV Soln 100 MG/20ML: INTRAVENOUS | Qty: 27 | Status: AC

## 2021-11-20 MED FILL — Dexamethasone Sodium Phosphate Inj 100 MG/10ML: INTRAMUSCULAR | Qty: 1 | Status: AC

## 2021-11-20 NOTE — Progress Notes (Signed)
REFERRING PROVIDER: Derwood Kaplan, MD [address] [place],  [place] [address]  PRIMARY PROVIDER:  Ernestene Kiel, MD  PRIMARY REASON FOR VISIT:  Encounter Diagnoses  Name Primary?   Malignant neoplasm of overlapping sites of stomach ([facility]) Yes   Family history of breast cancer    Family history of prostate cancer      HISTORY OF PRESENT ILLNESS:   Mackenzie Lewis, a 86 y.o. female, was seen for a [place] cancer genetics consultation at the request of Mackenzie Lewis due to a personal history of gastric cancer and family history of prostate and breast cancer.  Mackenzie Lewis presents to clinic today to discuss the possibility of a hereditary predisposition to cancer, to discuss genetic testing, and to further clarify her future cancer risks, as well as potential cancer risks for family members.   In 2023, at the age of 29, Mackenzie Lewis was diagnosed with poorly differentiated adenocarcinoma of the stomach with signet ring features.    CANCER HISTORY:  Oncology History  Gastric cancer ([place])  09/10/2021 Initial Diagnosis   Gastric cancer ([facility])   09/10/2021 Cancer Staging   Staging form: Stomach, AJCC 8th Edition - Clinical stage from 09/10/2021: Stage IVB (cT4b, cN0, cM1) - Signed by Derwood Kaplan, MD on 09/10/2021 Histopathologic type: Adenocarcinoma, NOS Stage prefix: Initial diagnosis Total positive nodes: 0 Histologic grade (G): G3 Histologic grading system: 3 grade system Sites of metastasis: Peritoneal surface Diagnostic confirmation: Positive histology PLUS positive immunophenotyping and/or positive genetic studies Specimen type: Endoscopy with Biopsy Staged by: Managing physician Carcinoembryonic antigen (CEA) (ng/mL): 2.8 Carbohydrate antigen 19-9 (CA 19-9) (U/mL): 4.9 HER2 status: Unknown Microsatellite instability (MSI): Unknown Tumor location in stomach: Other Clinical staging modalities: Biopsy, Endoscopy Stage used in treatment planning: Yes National  guidelines used in treatment planning: Yes Type of national guideline used in treatment planning: NCCN   09/28/2021 - 10/14/2021 Chemotherapy   Patient is on Treatment Plan : GASTROESOPHAGEAL FOLFOX + Nivolumab q14d     09/28/2021 -  Chemotherapy   Patient is on Treatment Plan : GASTROESOPHAGEAL FOLFOX + Nivolumab q14d        RISK FACTORS:  Colonoscopy: yes;  every 10 years . Hysterectomy: no-- due to endometriosis; before age 51 Ovaries intact: no.  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  If that happens, she is to call us so that we can schedule her for an ultrasound to look for synovitis.     Essential hypertension 09/09/2016   Gastric cancer ([place]) 09/10/2021   Hyperlipidemia 09/01/2016   Hypertension    Hypothyroidism 09/01/2016   Osteoarthritis of both feet 01/20/2016   Osteoarthritis, hand 01/20/2016   Thyroid disease     Past Surgical History:  Procedure Laterality Date   APPENDECTOMY     LAPAROSCOPIC APPENDECTOMY N/A 04/10/2016   Procedure: APPENDECTOMY LAPAROSCOPIC;  Surgeon: Coralie Keens, MD;  Location: [facility];  Service: General;  Laterality: N/A;    FAMILY HISTORY:  We obtained  a detailed, 4-generation family history.  Significant diagnoses are listed below: Family History  Problem Relation Age of Onset   Prostate cancer Father        metastatic; d. 93   Brain cancer Sister 45   Breast cancer Sister 62    Leukemia Cousin        x2 maternal female cousins; d. before 23   Breast cancer Daughter 8       DCIS      Mackenzie Lewis is unaware of previous family history of genetic testing for hereditary cancer risks. Patient's maternal ancestors are of [place] descent, and paternal ancestors are of [place] descent. There is reported Ashkenazi Jewish ancestry in her maternal family. There is no known consanguinity.  GENETIC COUNSELING ASSESSMENT: Mackenzie Lewis is a 86 y.o. female with a personal and family historywhich is somewhat suggestive of a hereditary cancer syndrome and predisposition to cancer given the presence of related cancers in the family and reported Ashkenazi Jewish ancestry. We, therefore, discussed and recommended the following at today's visit.   DISCUSSION: We discussed that 5 - 10% of cancer is hereditary.  Most cases of hereditary breast and prostate cancer are associated with mutations in BRCA1/2.  There are other genes that can be associated with hereditary breast and gastric cancer syndromes, such as CDH1.  We discussed that testing is beneficial for several reasons including knowing how to follow individuals for their cancer risks and understanding if other family members could be at risk for cancer and allowing them to undergo genetic testing.   We reviewed the characteristics, features and inheritance patterns of hereditary cancer syndromes. We also discussed genetic testing, including the appropriate family members to test, the process of testing, insurance coverage and turn-around-time for results. We discussed the implications of a negative, positive, carrier and/or variant of uncertain significant result. We recommended Mackenzie Lewis pursue genetic testing for a panel that includes genes associated with gastric, breast, prostate, and other cancer.   The Multi-Cancer + RNA Panel offered by [facility] includes sequencing and/or deletion/duplication analysis of the following 84 genes:  AIP*, ALK,  APC*, ATM*, AXIN2*, BAP1*, BARD1*, BLM*, BMPR1A*, BRCA1*, BRCA2*, BRIP1*, CASR, CDC73*, CDH1*, CDK4, CDKN1B*, CDKN1C*, CDKN2A, CEBPA, CHEK2*, CTNNA1*, DICER1*, DIS3L2*, EGFR, EPCAM, FH*, FLCN*, GATA2*, GPC3, GREM1, HOXB13, HRAS, KIT, MAX*, MEN1*, MET, MITF, MLH1*, MSH2*, MSH3*, MSH6*, MUTYH*, NBN*, NF1*, NF2*, NTHL1*, PALB2*, PDGFRA, PHOX2B, PMS2*, POLD1*, POLE*, POT1*, PRKAR1A*, PTCH1*, PTEN*, RAD50*, RAD51C*, RAD51D*, RB1*, RECQL4, RET, RUNX1*, SDHA*, SDHAF2*, SDHB*, SDHC*, SDHD*, SMAD4*, SMARCA4*, SMARCB1*, SMARCE1*, STK11*, SUFU*, TERC, TERT, TMEM127*, Tp53*, TSC1*, TSC2*, VHL*, WRN*, and WT1.  RNA analysis is performed for * genes.  Based on Mackenzie Lewis's personal and family history of cancer, she meets medical criteria for genetic testing.   PLAN: After considering the risks, benefits, and limitations, Mackenzie Lewis provided informed consent to pursue genetic testing and the blood sample was sent to [facility] for analysis of the [facility] Multi-Cancer +RNA Panel. Results should be available within approximately 3 weeks' time, at which point they will be disclosed by telephone to Mackenzie Lewis, as will any additional recommendations warranted by these results. Mackenzie Lewis will receive a summary of her genetic counseling visit and a copy of her results once available. This information will also be available in Epic.    Ms. Abbey questions were answered to her satisfaction today. Our contact information was provided should additional questions or concerns arise. Thank you for the referral and allowing [place] to share in the care  of your patient.   Cari M. Joette Lewis, Jolly, [facility] Genetic Counselor [email or web address] (P) [phone]  The patient was seen for a total of 30 minutes in face-to-face genetic counseling.  She was accompanied by her daughter, Mackenzie Lewis. Mackenzie Lewis was available to discuss this case as needed.    _______________________________________________________________________ For  Office Staff:  Number of people involved in session: 2 Was an Intern/ student involved with case: no

## 2021-11-23 ENCOUNTER — Inpatient Hospital Stay: Payer: Medicare Other

## 2021-11-23 VITALS — BP 156/68 | HR 94 | Temp 97.9°F | Resp 18 | Ht 64.0 in | Wt 188.0 lb

## 2021-11-23 DIAGNOSIS — C168 Malignant neoplasm of overlapping sites of stomach: Secondary | ICD-10-CM

## 2021-11-23 DIAGNOSIS — C169 Malignant neoplasm of stomach, unspecified: Secondary | ICD-10-CM | POA: Diagnosis not present

## 2021-11-23 DIAGNOSIS — Z5111 Encounter for antineoplastic chemotherapy: Secondary | ICD-10-CM | POA: Diagnosis not present

## 2021-11-23 DIAGNOSIS — Z79899 Other long term (current) drug therapy: Secondary | ICD-10-CM | POA: Diagnosis not present

## 2021-11-23 DIAGNOSIS — C786 Secondary malignant neoplasm of retroperitoneum and peritoneum: Secondary | ICD-10-CM | POA: Diagnosis not present

## 2021-11-23 MED ORDER — OXALIPLATIN CHEMO INJECTION 100 MG/20ML
68.0000 mg/m2 | Freq: Once | INTRAVENOUS | Status: AC
  Administered 2021-11-23: 135 mg via INTRAVENOUS
  Filled 2021-11-23: qty 20

## 2021-11-23 MED ORDER — FLUOROURACIL CHEMO INJECTION 2.5 GM/50ML
320.0000 mg/m2 | Freq: Once | INTRAVENOUS | Status: AC
  Administered 2021-11-23: 650 mg via INTRAVENOUS
  Filled 2021-11-23: qty 13

## 2021-11-23 MED ORDER — SODIUM CHLORIDE 0.9 % IV SOLN
1920.0000 mg/m2 | INTRAVENOUS | Status: DC
  Administered 2021-11-23: 3800 mg via INTRAVENOUS
  Filled 2021-11-23: qty 76

## 2021-11-23 MED ORDER — SODIUM CHLORIDE 0.9 % IV SOLN
240.0000 mg | Freq: Once | INTRAVENOUS | Status: AC
  Administered 2021-11-23: 240 mg via INTRAVENOUS
  Filled 2021-11-23: qty 24

## 2021-11-23 MED ORDER — PALONOSETRON HCL INJECTION 0.25 MG/5ML
0.2500 mg | Freq: Once | INTRAVENOUS | Status: AC
  Administered 2021-11-23: 0.25 mg via INTRAVENOUS
  Filled 2021-11-23: qty 5

## 2021-11-23 MED ORDER — DEXTROSE 5 % IV SOLN: Freq: Once | INTRAVENOUS | Status: AC

## 2021-11-23 MED ORDER — SODIUM CHLORIDE 0.9 % IV SOLN
150.0000 mg | Freq: Once | INTRAVENOUS | Status: AC
  Administered 2021-11-23: 150 mg via INTRAVENOUS
  Filled 2021-11-23: qty 150

## 2021-11-23 MED ORDER — LEUCOVORIN CALCIUM INJECTION 350 MG
320.0000 mg/m2 | Freq: Once | INTRAVENOUS | Status: AC
  Administered 2021-11-23: 630 mg via INTRAVENOUS
  Filled 2021-11-23: qty 31.5

## 2021-11-23 MED ORDER — SODIUM CHLORIDE 0.9 % IV SOLN
10.0000 mg | Freq: Once | INTRAVENOUS | Status: AC
  Administered 2021-11-23: 10 mg via INTRAVENOUS
  Filled 2021-11-23: qty 10

## 2021-11-23 NOTE — Patient Instructions (Signed)
[facility]  Discharge Instructions: Thank you for choosing [facility] to provide your oncology and hematology care.  If you have a lab appointment with the [facility], please go directly to the [facility] and check in at the registration area.   Wear comfortable clothing and clothing appropriate for easy access to any Portacath or PICC line.   We strive to give you quality time with your provider. You may need to reschedule your appointment if you arrive late (15 or more minutes).  Arriving late affects you and other patients whose appointments are after yours.  Also, if you miss three or more appointments without notifying the office, you may be dismissed from the clinic at the provider's discretion.      For prescription refill requests, have your pharmacy contact our office and allow 72 hours for refills to be completed.    Today you received the following chemotherapy and/or immunotherapy agents    To help prevent nausea and vomiting after your treatment, we encourage you to take your nausea medication as directed.  BELOW ARE SYMPTOMS THAT SHOULD BE REPORTED IMMEDIATELY: *FEVER GREATER THAN 100.4 F (38 C) OR HIGHER *CHILLS OR SWEATING *NAUSEA AND VOMITING THAT IS NOT CONTROLLED WITH YOUR NAUSEA MEDICATION *UNUSUAL SHORTNESS OF BREATH *UNUSUAL BRUISING OR BLEEDING *URINARY PROBLEMS (pain or burning when urinating, or frequent urination) *BOWEL PROBLEMS (unusual diarrhea, constipation, pain near the anus) TENDERNESS IN MOUTH AND THROAT WITH OR WITHOUT PRESENCE OF ULCERS (sore throat, sores in mouth, or a toothache) UNUSUAL RASH, SWELLING OR PAIN  UNUSUAL VAGINAL DISCHARGE OR ITCHING   Items with * indicate a potential emergency and should be followed up as soon as possible or go to the Emergency Department if any problems should occur.  Please show the CHEMOTHERAPY ALERT CARD or IMMUNOTHERAPY ALERT CARD at check-in to  the Emergency Department and triage nurse.  Should you have questions after your visit or need to cancel or reschedule your appointment, please contact [facility]  Dept: [phone]  and follow the prompts.  Office hours are 8:00 a.m. to 4:30 p.m. Monday - Friday. Please note that voicemails left after 4:00 p.m. may not be returned until the following business day.  We are closed weekends and major holidays. You have access to a nurse at all times for urgent questions. Please call the main number to the clinic Dept: [phone] and follow the prompts.  For any non-urgent questions, you may also contact your provider using MyChart. We now offer e-Visits for anyone 84 and older to request care online for non-urgent symptoms. For details visit [email or web address].   Also download the MyChart app! Go to the app store, search "MyChart", open the app, select [facility], and log in with your MyChart username and password.  Masks are optional in the cancer centers. If you would like for your care team to wear a mask while they are taking care of you, please let them know. You may have one support person who is at least 84 years old accompany you for your appointments. n What is this medication? Mackenzie (loo koe VOR in) prevents side effects from certain medications, such as methotrexate. It works by increasing folate levels. This helps protect healthy cells in your body. It may also be used to treat anemia caused by low levels of folate. It can also be used with fluorouracil, a type of chemotherapy, to treat colorectal cancer. It works by increasing  the effects of fluorouracil in the body. This medicine may be used for other purposes; ask your health care provider or pharmacist if you have questions. What should I tell my care team before I take this medication? They need to know if you have any of these conditions: Anemia from low levels of vitamin B12 in the blood An  unusual or allergic reaction to Mackenzie, folic acid, other medications, foods, dyes, or preservatives Pregnant or trying to get pregnant Breastfeeding How should I use this medication? This medication is injected into a vein or a muscle. It is given by your care team in a hospital or clinic setting. Talk to your care team about the use of this medication in children. Special care may be needed. Overdosage: If you think you have taken too much of this medicine contact a poison control center or emergency room at once. NOTE: This medicine is only for you. Do not share this medicine with others. What if I miss a dose? Keep appointments for follow-up doses. It is important not to miss your dose. Call your care team if you are unable to keep an appointment. What may interact with this medication? Capecitabine Fluorouracil Phenobarbital Phenytoin Primidone Trimethoprim;sulfamethoxazole This list may not describe all possible interactions. Give your health care provider a list of all the medicines, herbs, non-prescription drugs, or dietary supplements you use. Also tell them if you smoke, drink alcohol, or use illegal drugs. Some items may interact with your medicine. What should I watch for while using this medication? Your condition will be monitored carefully while you are receiving this medication. This medication may increase the side effects of 5-fluorouracil. Tell your care team if you have diarrhea or mouth sores that do not get better or that get worse. What side effects may I notice from receiving this medication? Side effects that you should report to your care team as soon as possible: Allergic reactions--skin rash, itching, hives, swelling of the face, lips, tongue, or throat This list may not describe all possible side effects. Call your doctor for medical advice about side effects. You may report side effects to FDA at 1-800-FDA-1088. Where should I keep my medication? This  medication is given in a hospital or clinic. It will not be stored at home. NOTE: This sheet is a summary. It may not cover all possible information. If you have questions about this medicine, talk to your doctor, pharmacist, or health care provider.  2023 Elsevier/Gold Standard (2021-06-12 00:00:00) Oxaliplatin Injection What is this medication? OXALIPLATIN (ox AL i PLA tin) treats some types of cancer. It works by slowing down the growth of cancer cells. This medicine may be used for other purposes; ask your health care provider or pharmacist if you have questions. COMMON BRAND NAME(S): Eloxatin What should I tell my care team before I take this medication? They need to know if you have any of these conditions: Heart disease History of irregular heartbeat or rhythm Liver disease Low blood cell levels (white cells, red cells, and platelets) Lung or breathing disease, such as asthma Take medications that treat or prevent blood clots Tingling of the fingers, toes, or other nerve disorder An unusual or allergic reaction to oxaliplatin, other medications, foods, dyes, or preservatives If you or your partner are pregnant or trying to get pregnant Breast-feeding How should I use this medication? This medication is injected into a vein. It is given by your care team in a hospital or clinic setting. Talk to your care team  about the use of this medication in children. Special care may be needed. Overdosage: If you think you have taken too much of this medicine contact a poison control center or emergency room at once. NOTE: This medicine is only for you. Do not share this medicine with others. What if I miss a dose? Keep appointments for follow-up doses. It is important not to miss a dose. Call your care team if you are unable to keep an appointment. What may interact with this medication? Do not take this medication with any of the  following: Cisapride Dronedarone Pimozide Thioridazine This medication may also interact with the following: Aspirin and aspirin-like medications Certain medications that treat or prevent blood clots, such as warfarin, apixaban, dabigatran, and rivaroxaban Cisplatin Cyclosporine Diuretics Medications for infection, such as acyclovir, adefovir, amphotericin B, bacitracin, cidofovir, foscarnet, ganciclovir, gentamicin, pentamidine, vancomycin NSAIDs, medications for pain and inflammation, such as ibuprofen or naproxen Other medications that cause heart rhythm changes Pamidronate Zoledronic acid This list may not describe all possible interactions. Give your health care provider a list of all the medicines, herbs, non-prescription drugs, or dietary supplements you use. Also tell them if you smoke, drink alcohol, or use illegal drugs. Some items may interact with your medicine. What should I watch for while using this medication? Your condition will be monitored carefully while you are receiving this medication. You may need blood work while taking this medication. This medication may make you feel generally unwell. This is not uncommon as chemotherapy can affect healthy cells as well as cancer cells. Report any side effects. Continue your course of treatment even though you feel ill unless your care team tells you to stop. This medication may increase your risk of getting an infection. Call your care team for advice if you get a fever, chills, sore throat, or other symptoms of a cold or flu. Do not treat yourself. Try to avoid being around people who are sick. Avoid taking medications that contain aspirin, acetaminophen, ibuprofen, naproxen, or ketoprofen unless instructed by your care team. These medications may hide a fever. Be careful brushing or flossing your teeth or using a toothpick because you may get an infection or bleed more easily. If you have any dental work done, tell your dentist  you are receiving this medication. This medication can make you more sensitive to cold. Do not drink cold drinks or use ice. Cover exposed skin before coming in contact with cold temperatures or cold objects. When out in cold weather wear warm clothing and cover your mouth and nose to warm the air that goes into your lungs. Tell your care team if you get sensitive to the cold. Talk to your care team if you or your partner are pregnant or think either of you might be pregnant. This medication can cause serious birth defects if taken during pregnancy and for 9 months after the last dose. A negative pregnancy test is required before starting this medication. A reliable form of contraception is recommended while taking this medication and for 9 months after the last dose. Talk to your care team about effective forms of contraception. Do not father a child while taking this medication and for 6 months after the last dose. Use a condom while having sex during this time period. Do not breastfeed while taking this medication and for 3 months after the last dose. This medication may cause infertility. Talk to your care team if you are concerned about your fertility. What side effects may I  notice from receiving this medication? Side effects that you should report to your care team as soon as possible: Allergic reactions--skin rash, itching, hives, swelling of the face, lips, tongue, or throat Bleeding--bloody or black, tar-like stools, vomiting blood or brown material that looks like coffee grounds, red or dark brown urine, small red or purple spots on skin, unusual bruising or bleeding Dry cough, shortness of breath or trouble breathing Heart rhythm changes--fast or irregular heartbeat, dizziness, feeling faint or lightheaded, chest pain, trouble breathing Infection--fever, chills, cough, sore throat, wounds that don't heal, pain or trouble when passing urine, general feeling of discomfort or being unwell Liver  injury--right upper belly pain, loss of appetite, nausea, light-colored stool, dark yellow or brown urine, yellowing skin or eyes, unusual weakness or fatigue Low red blood cell level--unusual weakness or fatigue, dizziness, headache, trouble breathing Muscle injury--unusual weakness or fatigue, muscle pain, dark yellow or brown urine, decrease in amount of urine Pain, tingling, or numbness in the hands or feet Sudden and severe headache, confusion, change in vision, seizures, which may be signs of posterior reversible encephalopathy syndrome (PRES) Unusual bruising or bleeding Side effects that usually do not require medical attention (report to your care team if they continue or are bothersome): Diarrhea Nausea Pain, redness, or swelling with sores inside the mouth or throat Unusual weakness or fatigue Vomiting This list may not describe all possible side effects. Call your doctor for medical advice about side effects. You may report side effects to FDA at 1-800-FDA-1088. Where should I keep my medication? This medication is given in a hospital or clinic. It will not be stored at home. NOTE: This sheet is a summary. It may not cover all possible information. If you have questions about this medicine, talk to your doctor, pharmacist, or health care provider.  2023 Elsevier/Gold Standard (2021-05-29 00:00:00) Nivolumab Injection What is this medication? NIVOLUMAB (nye VOL ue mab) treats some types of cancer. It works by helping your immune system slow or stop the spread of cancer cells. It is a monoclonal antibody. This medicine may be used for other purposes; ask your health care provider or pharmacist if you have questions. COMMON BRAND NAME(S): Opdivo What should I tell my care team before I take this medication? They need to know if you have any of these conditions: Allogeneic stem cell transplant (uses someone else's stem cells) Autoimmune diseases, such as Crohn disease, ulcerative  colitis, lupus History of chest radiation Nervous system problems, such as Guillain-Barre syndrome or myasthenia gravis Organ transplant An unusual or allergic reaction to nivolumab, other medications, foods, dyes, or preservatives Pregnant or trying to get pregnant Breast-feeding How should I use this medication? This medication is infused into a vein. It is given in a hospital or clinic setting. A special MedGuide will be given to you before each treatment. Be sure to read this information carefully each time. Talk to your care team about the use of this medication in children. While it may be prescribed for children as young as 12 years for selected conditions, precautions do apply. Overdosage: If you think you have taken too much of this medicine contact a poison control center or emergency room at once. NOTE: This medicine is only for you. Do not share this medicine with others. What if I miss a dose? Keep appointments for follow-up doses. It is important not to miss your dose. Call your care team if you are unable to keep an appointment. What may interact with this medication? Interactions  have not been studied. This list may not describe all possible interactions. Give your health care provider a list of all the medicines, herbs, non-prescription drugs, or dietary supplements you use. Also tell them if you smoke, drink alcohol, or use illegal drugs. Some items may interact with your medicine. What should I watch for while using this medication? Your condition will be monitored carefully while you are receiving this medication. You may need blood work while taking this medication. This medication may cause serious skin reactions. They can happen weeks to months after starting the medication. Contact your care team right away if you notice fevers or flu-like symptoms with a rash. The rash may be red or purple and then turn into blisters or peeling of the skin. You may also notice a red rash  with swelling of the face, lips, or lymph nodes in your neck or under your arms. Tell your care team right away if you have any change in your eyesight. Talk to your care team if you are pregnant or think you might be pregnant. A negative pregnancy test is required before starting this medication. A reliable form of contraception is recommended while taking this medication and for 5 months after the last dose. Talk to your care team about effective forms of contraception. Do not breast-feed while taking this medication and for 5 months after the last dose. What side effects may I notice from receiving this medication? Side effects that you should report to your care team as soon as possible: Allergic reactions--skin rash, itching, hives, swelling of the face, lips, tongue, or throat Dry cough, shortness of breath or trouble breathing Eye pain, redness, irritation, or discharge with blurry or decreased vision Heart muscle inflammation--unusual weakness or fatigue, shortness of breath, chest pain, fast or irregular heartbeat, dizziness, swelling of the ankles, feet, or hands Hormone gland problems--headache, sensitivity to light, unusual weakness or fatigue, dizziness, fast or irregular heartbeat, increased sensitivity to cold or heat, excessive sweating, constipation, hair loss, increased thirst or amount of urine, tremors or shaking, irritability Infusion reactions--chest pain, shortness of breath or trouble breathing, feeling faint or lightheaded Kidney injury (glomerulonephritis)--decrease in the amount of urine, red or dark brown urine, foamy or bubbly urine, swelling of the ankles, hands, or feet Liver injury--right upper belly pain, loss of appetite, nausea, light-colored stool, dark yellow or brown urine, yellowing skin or eyes, unusual weakness or fatigue Pain, tingling, or numbness in the hands or feet, muscle weakness, change in vision, confusion or trouble speaking, loss of balance or  coordination, trouble walking, seizures Rash, fever, and swollen lymph nodes Redness, blistering, peeling, or loosening of the skin, including inside the mouth Sudden or severe stomach pain, bloody diarrhea, fever, nausea, vomiting Side effects that usually do not require medical attention (report these to your care team if they continue or are bothersome): Bone, joint, or muscle pain Diarrhea Fatigue Loss of appetite Nausea Skin rash This list may not describe all possible side effects. Call your doctor for medical advice about side effects. You may report side effects to FDA at 1-800-FDA-1088. Where should I keep my medication? This medication is given in a hospital or clinic. It will not be stored at home. NOTE: This sheet is a summary. It may not cover all possible information. If you have questions about this medicine, talk to your doctor, pharmacist, or health care provider.  2023 Elsevier/Gold Standard (2021-01-02 00:00:00) Fluorouracil Injection What is this medication? FLUOROURACIL (flure oh YOOR a sil) treats some  types of cancer. It works by slowing down the growth of cancer cells. This medicine may be used for other purposes; ask your health care provider or pharmacist if you have questions. COMMON BRAND NAME(S): Adrucil What should I tell my care team before I take this medication? They need to know if you have any of these conditions: Blood disorders Dihydropyrimidine dehydrogenase (DPD) deficiency Infection, such as chickenpox, cold sores, herpes Kidney disease Liver disease Poor nutrition Recent or ongoing radiation therapy An unusual or allergic reaction to fluorouracil, other medications, foods, dyes, or preservatives If you or your partner are pregnant or trying to get pregnant Breast-feeding How should I use this medication? This medication is injected into a vein. It is administered by your care team in a hospital or clinic setting. Talk to your care team  about the use of this medication in children. Special care may be needed. Overdosage: If you think you have taken too much of this medicine contact a poison control center or emergency room at once. NOTE: This medicine is only for you. Do not share this medicine with others. What if I miss a dose? Keep appointments for follow-up doses. It is important not to miss your dose. Call your care team if you are unable to keep an appointment. What may interact with this medication? Do not take this medication with any of the following: Live virus vaccines This medication may also interact with the following: Medications that treat or prevent blood clots, such as warfarin, enoxaparin, dalteparin This list may not describe all possible interactions. Give your health care provider a list of all the medicines, herbs, non-prescription drugs, or dietary supplements you use. Also tell them if you smoke, drink alcohol, or use illegal drugs. Some items may interact with your medicine. What should I watch for while using this medication? Your condition will be monitored carefully while you are receiving this medication. This medication may make you feel generally unwell. This is not uncommon as chemotherapy can affect healthy cells as well as cancer cells. Report any side effects. Continue your course of treatment even though you feel ill unless your care team tells you to stop. In some cases, you may be given additional medications to help with side effects. Follow all directions for their use. This medication may increase your risk of getting an infection. Call your care team for advice if you get a fever, chills, sore throat, or other symptoms of a cold or flu. Do not treat yourself. Try to avoid being around people who are sick. This medication may increase your risk to bruise or bleed. Call your care team if you notice any unusual bleeding. Be careful brushing or flossing your teeth or using a toothpick because  you may get an infection or bleed more easily. If you have any dental work done, tell your dentist you are receiving this medication. Avoid taking medications that contain aspirin, acetaminophen, ibuprofen, naproxen, or ketoprofen unless instructed by your care team. These medications may hide a fever. Do not treat diarrhea with over the counter products. Contact your care team if you have diarrhea that lasts more than 2 days or if it is severe and watery. This medication can make you more sensitive to the sun. Keep out of the sun. If you cannot avoid being in the sun, wear protective clothing and sunscreen. Do not use sun lamps, tanning beds, or tanning booths. Talk to your care team if you or your partner wish to become pregnant or  think you might be pregnant. This medication can cause serious birth defects if taken during pregnancy and for 3 months after the last dose. A reliable form of contraception is recommended while taking this medication and for 3 months after the last dose. Talk to your care team about effective forms of contraception. Do not father a child while taking this medication and for 3 months after the last dose. Use a condom while having sex during this time period. Do not breastfeed while taking this medication. This medication may cause infertility. Talk to your care team if you are concerned about your fertility. What side effects may I notice from receiving this medication? Side effects that you should report to your care team as soon as possible: Allergic reactions--skin rash, itching, hives, swelling of the face, lips, tongue, or throat Heart attack--pain or tightness in the chest, shoulders, arms, or jaw, nausea, shortness of breath, cold or clammy skin, feeling faint or lightheaded Heart failure--shortness of breath, swelling of the ankles, feet, or hands, sudden weight gain, unusual weakness or fatigue Heart rhythm changes--fast or irregular heartbeat, dizziness, feeling  faint or lightheaded, chest pain, trouble breathing High ammonia level--unusual weakness or fatigue, confusion, loss of appetite, nausea, vomiting, seizures Infection--fever, chills, cough, sore throat, wounds that don't heal, pain or trouble when passing urine, general feeling of discomfort or being unwell Low red blood cell level--unusual weakness or fatigue, dizziness, headache, trouble breathing Pain, tingling, or numbness in the hands or feet, muscle weakness, change in vision, confusion or trouble speaking, loss of balance or coordination, trouble walking, seizures Redness, swelling, and blistering of the skin over hands and feet Severe or prolonged diarrhea Unusual bruising or bleeding Side effects that usually do not require medical attention (report to your care team if they continue or are bothersome): Dry skin Headache Increased tears Nausea Pain, redness, or swelling with sores inside the mouth or throat Sensitivity to light Vomiting This list may not describe all possible side effects. Call your doctor for medical advice about side effects. You may report side effects to FDA at 1-800-FDA-1088. Where should I keep my medication? This medication is given in a hospital or clinic. It will not be stored at home. NOTE: This sheet is a summary. It may not cover all possible information. If you have questions about this medicine, talk to your doctor, pharmacist, or health care provider.  2023 Elsevier/Gold Standard (2021-06-09 00:00:00)

## 2021-11-24 ENCOUNTER — Encounter: Payer: Self-pay | Admitting: Genetic Counselor

## 2021-11-25 ENCOUNTER — Encounter: Payer: Self-pay | Admitting: Oncology

## 2021-11-25 ENCOUNTER — Inpatient Hospital Stay: Payer: Medicare Other

## 2021-11-25 VITALS — BP 173/76 | HR 79 | Temp 98.1°F | Resp 18 | Ht 64.0 in | Wt 190.0 lb

## 2021-11-25 DIAGNOSIS — C786 Secondary malignant neoplasm of retroperitoneum and peritoneum: Secondary | ICD-10-CM | POA: Diagnosis not present

## 2021-11-25 DIAGNOSIS — C168 Malignant neoplasm of overlapping sites of stomach: Secondary | ICD-10-CM

## 2021-11-25 DIAGNOSIS — C169 Malignant neoplasm of stomach, unspecified: Secondary | ICD-10-CM | POA: Diagnosis not present

## 2021-11-25 DIAGNOSIS — Z79899 Other long term (current) drug therapy: Secondary | ICD-10-CM | POA: Diagnosis not present

## 2021-11-25 DIAGNOSIS — Z5111 Encounter for antineoplastic chemotherapy: Secondary | ICD-10-CM | POA: Diagnosis not present

## 2021-11-25 MED ORDER — SODIUM CHLORIDE 0.9% FLUSH
10.0000 mL | INTRAVENOUS | Status: DC | PRN
  Administered 2021-11-25: 10 mL

## 2021-11-25 MED ORDER — HEPARIN SOD (PORK) LOCK FLUSH 100 UNIT/ML IV SOLN
500.0000 [IU] | Freq: Once | INTRAVENOUS | Status: AC | PRN
  Administered 2021-11-25: 500 [IU]

## 2021-12-03 ENCOUNTER — Encounter: Payer: Self-pay | Admitting: Oncology

## 2021-12-03 ENCOUNTER — Inpatient Hospital Stay (INDEPENDENT_AMBULATORY_CARE_PROVIDER_SITE_OTHER): Payer: Medicare Other | Admitting: Oncology

## 2021-12-03 ENCOUNTER — Other Ambulatory Visit: Payer: Self-pay | Admitting: Oncology

## 2021-12-03 ENCOUNTER — Inpatient Hospital Stay: Payer: Medicare Other

## 2021-12-03 VITALS — BP 172/80 | HR 90 | Temp 98.0°F | Resp 18 | Ht 64.0 in | Wt 189.4 lb

## 2021-12-03 DIAGNOSIS — C169 Malignant neoplasm of stomach, unspecified: Secondary | ICD-10-CM | POA: Diagnosis not present

## 2021-12-03 DIAGNOSIS — Z79899 Other long term (current) drug therapy: Secondary | ICD-10-CM | POA: Diagnosis not present

## 2021-12-03 DIAGNOSIS — D649 Anemia, unspecified: Secondary | ICD-10-CM | POA: Diagnosis not present

## 2021-12-03 DIAGNOSIS — Z5111 Encounter for antineoplastic chemotherapy: Secondary | ICD-10-CM | POA: Diagnosis not present

## 2021-12-03 DIAGNOSIS — E876 Hypokalemia: Secondary | ICD-10-CM

## 2021-12-03 DIAGNOSIS — C168 Malignant neoplasm of overlapping sites of stomach: Secondary | ICD-10-CM

## 2021-12-03 DIAGNOSIS — C786 Secondary malignant neoplasm of retroperitoneum and peritoneum: Secondary | ICD-10-CM | POA: Diagnosis not present

## 2021-12-03 LAB — BASIC METABOLIC PANEL
BUN: 13 (ref 4–21)
CO2: 28 — AB (ref 13–22)
Chloride: 104 (ref 99–108)
Creatinine: 0.8 (ref 0.5–1.1)
Glucose: 138
Potassium: 3.1 mEq/L — AB (ref 3.5–5.1)
Sodium: 138 (ref 137–147)

## 2021-12-03 LAB — TSH: TSH: 1.657 u[IU]/mL (ref 0.350–4.500)

## 2021-12-03 LAB — CBC AND DIFFERENTIAL
HCT: 37 (ref 36–46)
Hemoglobin: 12.8 (ref 12.0–16.0)
Neutrophils Absolute: 3.28
Platelets: 152 10*3/uL (ref 150–400)
WBC: 6.7

## 2021-12-03 LAB — HEPATIC FUNCTION PANEL
ALT: 22 U/L (ref 7–35)
AST: 27 (ref 13–35)
Alkaline Phosphatase: 80 (ref 25–125)
Bilirubin, Total: 0.6

## 2021-12-03 LAB — COMPREHENSIVE METABOLIC PANEL
Albumin: 4.2 (ref 3.5–5.0)
Calcium: 9.1 (ref 8.7–10.7)

## 2021-12-03 LAB — CBC: RBC: 4.23 (ref 3.87–5.11)

## 2021-12-03 MED ORDER — POTASSIUM CHLORIDE CRYS ER 20 MEQ PO TBCR: 20.0000 meq | EXTENDED_RELEASE_TABLET | Freq: Every day | ORAL | 5 refills | Status: DC

## 2021-12-03 NOTE — Progress Notes (Signed)
[facility]  [address] [place],  [place]  [address] [phone]  Clinic Day: 12/03/2021  Referring physician: Derwood Kaplan, MD   ASSESSMENT & PLAN:   Gastric adenocarcinoma This is a poorly differentiated adenocarcinoma with signet ring features and ulceration.  I would consider it as metastatic to the omentum and so that would stage this as a T4 N0 M1, stage IVB.  PET scan did not show evidence of metastatic disease other than the omentum. She is on treatment with chemotherapy of FOLFOX and immunotherapy with nivolumab to improve the success rate. Stain for HER2 was negative at 0. She is tolerating treatment well other than mild nausea and mouth sores.    She is tolerating treatment well other than mild nausea and mouth sores.  I need to place her on potassium supplement of 20 mEq daily and also have her increase it in her diet.  She will proceed with her FOLFOX + Nivolumab treatment next week. We will then see her back in 2 weeks with CBC,CMP, TSH and T4 for the next cycle.  She will also be due for CT of chest abdomen and pelvis.  I reviewed all of this with her and her family and answered their questions. She understands and agrees with this plan.  I provided 20 minutes of face-to-face time during this this encounter and > 50% was spent counseling as documented under my assessment and plan.    Derwood Kaplan, MD [facility] [address] [place] [address] Dept: [phone] Dept Fax: [phone]   CHIEF COMPLAINT:  CC: Gastric cancer  Current Treatment: Chemotherapy/immunotherapy   HISTORY OF PRESENT ILLNESS:  Mackenzie Lewis is a 86 y.o. female with a history of gastric cancer who is referred in consultation with Dr. Sandria Senter for assessment and management.  She had noticed that she was having regurgitation when eating and had lost over 30 pounds.  An  ultrasound was done, revealing hepatic steatosis, and led to an MRI scan on June 5 which revealed gastric wall thickening with confluent nodularity of the omentum anterior to the stomach measuring 3.5 cm consistent with metastatic tumor.  She also had low-grade edema and wall thickening extending into the duodenum from the stomach.  She was referred to Dr. Sandria Senter and he did an EGD on July 7.  This revealed a large ulceration measuring 1.2 cm along the greater curvature.  She also had diffusely edematous and erythematous wall with erosions of the antrum and stiff and friable mucosa with oozing of blood.  These findings extended to the gastric fundus as well.  Pathology revealed a poorly differentiated adenocarcinoma with signet ring features from the biopsies of the ulcer as well as the antrum and the fundus of the stomach.  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  She was placed on omeprazole.  Her test for H. pylori was negative.  She was referred to Dr. Kendell Bane for consideration of surgery but he felt this was not resectable because of the extensive involvement and I agree.  I would consider this extending to the duodenum and also metastatic to the omentum.  PET scan confirmed these findings and she wished to pursue systemic intravenous therapy.  She continues to eat and has adjusted her diet to softer foods and liquids and is drinking boost so she has maintained her weight.  She does have  some Zofran ODT for nausea when needed.  She has had some anorexia, nausea, occasional vomiting, early satiety, dysphagia, and belching.  A CEA and CA 19-9 were normal. She has been started on FOLFOX chemotherapy along with immunotherapy.  INTERVAL HISTORY:  I have reviewed her chart and materials related to her cancer extensively and collaborated history with the patient. Summary of oncologic history is as follows: Oncology History  Gastric cancer ([facility])   09/10/2021 Initial Diagnosis   Gastric cancer ([place])   09/10/2021 Cancer Staging   Staging form: Stomach, AJCC 8th Edition - Clinical stage from 09/10/2021: Stage IVB (cT4b, cN0, cM1) - Signed by Derwood Kaplan, MD on 09/10/2021 Histopathologic type: Adenocarcinoma, NOS Stage prefix: Initial diagnosis Total positive nodes: 0 Histologic grade (G): G3 Histologic grading system: 3 grade system Sites of metastasis: Peritoneal surface Diagnostic confirmation: Positive histology PLUS positive immunophenotyping and/or positive genetic studies Specimen type: Endoscopy with Biopsy Staged by: Managing physician Carcinoembryonic antigen (CEA) (ng/mL): 2.8 Carbohydrate antigen 19-9 (CA 19-9) (U/mL): 4.9 HER2 status: Unknown Microsatellite instability (MSI): Unknown Tumor location in stomach: Other Clinical staging modalities: Biopsy, Endoscopy Stage used in treatment planning: Yes National guidelines used in treatment planning: Yes Type of national guideline used in treatment planning: NCCN   09/28/2021 - 10/14/2021 Chemotherapy   Patient is on Treatment Plan : GASTROESOPHAGEAL FOLFOX + Nivolumab q14d     09/28/2021 -  Chemotherapy   Patient is on Treatment Plan : GASTROESOPHAGEAL FOLFOX + Nivolumab q14d     12/09/2021 Genetic Testing   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  Report date is 12/09/2021.   The Multi-Cancer + RNA Panel offered by [facility] includes sequencing and/or deletion/duplication analysis of the following 84 genes:  AIP*, ALK, APC*, ATM*, AXIN2*, BAP1*, BARD1*, BLM*, BMPR1A*, BRCA1*, BRCA2*, BRIP1*, CASR, CDC73*, CDH1*, CDK4, CDKN1B*, CDKN1C*, CDKN2A, CEBPA, CHEK2*, CTNNA1*, DICER1*, DIS3L2*, EGFR, EPCAM, FH*, FLCN*, GATA2*, GPC3, GREM1, HOXB13, HRAS, KIT, MAX*, MEN1*, MET, MITF, MLH1*, MSH2*, MSH3*, MSH6*, MUTYH*, NBN*, NF1*, NF2*, NTHL1*, PALB2*, PDGFRA, PHOX2B, PMS2*, POLD1*, POLE*, POT1*, PRKAR1A*, PTCH1*, PTEN*, RAD50*,  RAD51C*, RAD51D*, RB1*, RECQL4, RET, RUNX1*, SDHA*, SDHAF2*, SDHB*, SDHC*, SDHD*, SMAD4*, SMARCA4*, SMARCB1*, SMARCE1*, STK11*, SUFU*, TERC, TERT, TMEM127*, Tp53*, TSC1*, TSC2*, VHL*, WRN*, and WT1.  RNA analysis is performed for * genes.    INTERVAL HISTORY Mackenzie Lewis is seen in the clinic for follow up of her gastric cancer.         She states she has a sore in her mouth.   She notes after her most recent chemotherapy treatment she felt more nauseated.   She took ondansetron 4 mg but they suggested her to take the prochlorperazine 10 mg instead.   She also noted for 2 nights she couldn't sleep. I will prescribe Lorazepam 0.5 mg as needed for sleep and nausea.    She denies fever, chills, night sweats, or other signs of infection. She denies cardiorespiratory and gastrointestinal issues. She  denies pain. Her appetite is fair.  She has lost 2 pounds since her last visit.     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  If that happens, she is to call us so that we can schedule her for an ultrasound to look for synovitis.     Essential hypertension 09/09/2016   Gastric cancer ([facility]) 09/10/2021   Hyperlipidemia 09/01/2016   Hypertension    Hypothyroidism 09/01/2016   Osteoarthritis of both feet 01/20/2016   Osteoarthritis, hand 01/20/2016   Thyroid disease   Degenerative disc disease History of endometriosis  Past Surgical History:  Procedure Laterality  Date   APPENDECTOMY     LAPAROSCOPIC APPENDECTOMY N/A 04/10/2016   Procedure: APPENDECTOMY LAPAROSCOPIC;  Surgeon: Coralie Keens, MD;  Location: [facility];  Service: General;  Laterality: N/A;  Bilateral tubal ligation Total hysterectomy and bilateral salpingo-oophorectomy in 1980  Family History  Problem Relation Age of Onset   Hypertension Mother    Prostate cancer Father        metastatic; d. 22   Brain cancer Sister 14   Breast cancer Sister 15   AAA (abdominal aortic aneurysm) Brother    Leukemia Cousin        x2 maternal female cousins; d. before 84   Breast cancer Daughter 52       DCIS  Her sister has had breast cancer as well as a tumor of her head Her daughter has had breast cancer last year  Social History:  reports that she has never smoked. She has never used smokeless tobacco. She reports that she does not currently use alcohol. She reports that she does not use drugs.The patient is accompanied by her son, daughter-in-law and daughter today.  She is single but lives with a significant other.  She has the 2 children.  She worked in [employer] (medical) and denies any chemical or toxin exposures.  She grew up in [place] and [place].  She is active and healthy, especially for her age.  Allergies:  Allergies  Allergen Reactions   Sulfa Antibiotics Rash    Other reaction(s): Other (See Comments) "Made me feel weird"    Current Medications: Current Outpatient Medications  Medication Sig Dispense Refill   aspirin 81 MG EC tablet Take 81 mg by mouth daily. Swallow whole.     Calcium Carbonate (CALCIUM 600 PO) Take 1 tablet by mouth daily.     Cholecalciferol (VITAMIN D3) 5000 units CAPS Take 1 capsule by mouth daily.     EXFORGE HCT 5-160-12.5 MG TABS Take 1 tablet by mouth daily.     famotidine (PEPCID) 40 MG tablet Take 40 mg by mouth at bedtime.     KRILL OIL PO Take 1 capsule by mouth daily. Unknown strenght     levothyroxine (SYNTHROID, LEVOTHROID) 75 MCG tablet Take 75 mcg by mouth  daily before breakfast.      LORazepam (ATIVAN) 0.5 MG tablet Take 1 tablet (0.5 mg total) by mouth at bedtime. 30 tablet 0   NON FORMULARY MMW: 3 parts Maalox 2 parts Benadryl 1 part viscious lidicaine  Disp. 6oz  Instructions: 76m swish and swallow every 3-4 hours     omeprazole (PRILOSEC) 40 MG capsule Take 40 mg by mouth 2 (two) times daily.     ondansetron (ZOFRAN) 4 MG tablet Take 4 mg by mouth every 4 (four) hours as needed.     ondansetron (ZOFRAN-ODT) 4 MG disintegrating tablet Take 1 tablet (4 mg total) by mouth every 8 (eight) hours as needed for nausea or  vomiting. 90 tablet 0   polyethylene glycol powder (GLYCOLAX/MIRALAX) 17 GM/SCOOP powder SMARTSIG:1 scoopful By Mouth Daily     potassium chloride SA (KLOR-CON M) 20 MEQ tablet Take 1 tablet (20 mEq total) by mouth daily. 30 tablet 5   pravastatin (PRAVACHOL) 20 MG tablet Take 1 tablet (20 mg total) by mouth every evening. 90 tablet 3   Probiotic Product (PROBIOTIC DAILY PO) Take 1 tablet by mouth daily.     prochlorperazine (COMPAZINE) 10 MG tablet Take 1 tablet (10 mg total) by mouth every 6 (six) hours as needed for nausea or vomiting. 90 tablet 3   No current facility-administered medications for this visit.    REVIEW OF SYSTEMS:  Review of Systems  Eyes: Negative.   Respiratory: Negative.    Cardiovascular: Negative.   Gastrointestinal:  Positive for constipation and nausea.       She does have some dysphagia and regurgitation with certain foods.  She has to eat very slowly.  Genitourinary: Negative.    Musculoskeletal: Negative.   Skin: Negative.   Neurological: Negative.   Hematological: Negative.   Psychiatric/Behavioral: Negative.        VITALS:  Blood pressure (!) 172/80, pulse 90, temperature 98 F (36.7 C), temperature source Oral, resp. rate 18, height _0  (1.626 m), weight 189 lb 6.4 oz (85.9 kg), SpO2 96 %.  Wt Readings from Last 3 Encounters:  12/23/21 195 lb (88.5 kg)  12/21/21 191 lb (86.6  kg)  12/18/21 189 lb 6.4 oz (85.9 kg)    Body mass index is 32.51 kg/m.  Performance status (ECOG): 1 - Symptomatic but completely ambulatory  PHYSICAL EXAM:  Physical Exam Constitutional:      General: She is not in acute distress.    Appearance: Normal appearance. She is not ill-appearing or toxic-appearing.  HENT:     Head: Normocephalic and atraumatic.     Nose: Nose normal.     Mouth/Throat:     Pharynx: Oropharynx is clear.  Eyes:     General:        Right eye: No discharge.        Left eye: No discharge.     Extraocular Movements: Extraocular movements intact.     Conjunctiva/sclera: Conjunctivae normal.     Pupils: Pupils are equal, round, and reactive to light.  Cardiovascular:     Rate and Rhythm: Normal rate and regular rhythm.     Pulses: Normal pulses.     Heart sounds: Normal heart sounds. No murmur heard.    No gallop.  Pulmonary:     Effort: Pulmonary effort is normal. No respiratory distress.     Breath sounds: Normal breath sounds. No wheezing or rales.  Abdominal:     General: Bowel sounds are normal.     Palpations: Abdomen is soft. There is mass.     Comments: She has a long mass effect in the epigastric area measuring at least 12 to 15 cm long and hard to palpation  Musculoskeletal:        General: Normal range of motion.     Cervical back: Normal range of motion and neck supple.  Skin:    General: Skin is warm and dry.  Neurological:     General: No focal deficit present.     Mental Status: She is alert and oriented to person, place, and time.  Psychiatric:        Mood and Affect: Mood normal.        Behavior:  Behavior normal.        Thought Content: Thought content normal.        Judgment: Judgment normal.     LABS:      Latest Ref Rng & Units 12/16/2021   12:00 AM 12/03/2021   12:00 AM 11/19/2021   12:00 AM  CBC  WBC  5.5     6.7     7.1      Hemoglobin 12.0 - 16.0 13.6     12.8     13.8      Hematocrit 36 - 46 39     37     38       Platelets 150 - 400 K/uL 143     152     187         This result is from an external source.      Latest Ref Rng & Units 12/16/2021   12:00 AM 12/03/2021   12:00 AM 11/19/2021   12:00 AM  CMP  BUN 4 - _0 Creatinine 0.5 - 1.1 0.8     0.8     0.7      Sodium 137 - 147 138     138     139      Potassium 3.5 - 5.1 mEq/L 3.7     3.1     3.6      Chloride 99 - 108 105     104     106      CO2 13 - _1 Calcium 8.7 - 10.7 9.5     9.1  C    9.2      Alkaline Phos 25 - 125 85     80     79      AST 13 - 35 _2 ALT 7 - 35 U/L _3 C Corrected result   This result is from an external source.     Lab Results  Component Value Date   CEA1 2.9 09/10/2021   /  CEA  Date Value Ref Range Status  09/10/2021 2.9 0.0 - 4.7 ng/mL Final    Comment:    (NOTE)                             Nonsmokers          <3.9                             Smokers             <5.6 [employer] Electrochemiluminescence Immunoassay (ECLIA) Values obtained with different assay methods or kits cannot be used interchangeably.  Results cannot be interpreted as absolute evidence of the presence or absence of malignant disease. Performed At: [facility] [place], [place] [id number] Rush Farmer MD WU:[id number]    No results found for: "PSA1" No results found for: "CAN199" No results found for: "CAN125"  No results found for: "TOTALPROTELP", "ALBUMINELP", "A1GS", "A2GS", "BETS", "BETA2SER", "GAMS", "MSPIKE", "SPEI" No results found for: "  TIBC", "FERRITIN", "IRONPCTSAT" No results found for: "LDH"  STUDIES:  No results found.  EXAM:09/18/2021 NUCLEAR MEDICINE PET SKULL BASE TO THIGH CLINICAL DATA:  Initial treatment strategy for gastric cancer.   EXAM: NUCLEAR MEDICINE PET SKULL BASE TO THIGH   TECHNIQUE: 9.2 mCi F-18 FDG was injected intravenously. Full-ring PET imaging was performed from the  skull base to thigh after the radiotracer. CT data was obtained and used for attenuation correction and anatomic localization.   Fasting blood glucose: 124 mg/dl   COMPARISON:  MRI July 20, 2021 and CT April 10, 2016   FINDINGS: Mediastinal blood pool activity: SUV max 2.4   Liver activity: SUV max NA   NECK: No hypermetabolic cervical adenopathy.   Symmetric hypermetabolic hyperplasia of the tonsils commonly reactive.   Incidental CT findings: none   CHEST: No hypermetabolic thoracic adenopathy.   No hypermetabolic pulmonary nodules or masses.   Incidental CT findings: Aortic atherosclerosis. Calcified mediastinal and right hilar lymph nodes. Motion degraded examination reveals no suspicious pulmonary nodules or masses. Patulous esophagus with a small hiatal hernia.   ABDOMEN/PELVIS: Evaluation of the gastric wall is limited by minimal distension, within this context: Similar diffuse nonspecific gastric wall thickening with no significant change in the appearance of the possible gastric body ulceration seen on image 117/4 and diffuse hypermetabolic activity within the stomach demonstrating a max SUV of 8.6.   Mildly metabolic nodularity anterior and inferior to the greater curvature of the stomach with the largest of which measures 22 x 13 mm on image 119/4 with a max SUV of 2.3.   No abnormal hypermetabolic activity within the liver, pancreas, adrenal glands or spleen.   No hypermetabolic abdominopelvic adenopathy.   Incidental CT findings: Similar mild intrahepatic and moderate extrahepatic biliary ductal dilation with the common duct measuring 13 mm. Colonic diverticulosis without findings of acute diverticulitis. Aortic atherosclerosis.   SKELETON: No focal hypermetabolic activity to suggest skeletal metastasis.   Incidental CT findings: Multilevel degenerative changes spine   IMPRESSION: 1. Evaluation of the gastric wall is limited by minimal  distension, within this context, here is diffuse hypermetabolic activity within the stomach with similar diffuse nonspecific gastric wall thickening and no significant change in the appearance of the possible anterior gastric body ulceration. 2. Mildly metabolic omental nodularity anterior and inferior to the stomach, likely reflects omental disease involvement. 3. No convincing evidence of hypermetabolic metastatic disease in the neck, chest, or pelvis. 4. Similar mild intrahepatic and moderate extrahepatic biliary ductal dilation with the common duct measuring 13 mm no suspicious hypermetabolic lesion identified within the duct and no discrete lesion identified on prior MRI dated July 20, 2021. 5.  Aortic Atherosclerosis (ICD10-I70.0).        I,Gabriella Ballesteros,acting as a scribe for Derwood Kaplan, MD.,have documented all relevant documentation on the behalf of Derwood Kaplan, MD,as directed by  Derwood Kaplan, MD while in the presence of Derwood Kaplan, MD.

## 2021-12-04 ENCOUNTER — Other Ambulatory Visit: Payer: Self-pay

## 2021-12-04 ENCOUNTER — Encounter: Payer: Self-pay | Admitting: Oncology

## 2021-12-04 LAB — T4: T4, Total: 13.4 ug/dL — ABNORMAL HIGH (ref 4.5–12.0)

## 2021-12-04 MED FILL — Oxaliplatin IV Soln 100 MG/20ML: INTRAVENOUS | Qty: 27 | Status: AC

## 2021-12-04 MED FILL — Leucovorin Calcium For Inj 350 MG: INTRAMUSCULAR | Qty: 31.5 | Status: AC

## 2021-12-04 MED FILL — Fosaprepitant Dimeglumine For IV Infusion 150 MG (Base Eq): INTRAVENOUS | Qty: 5 | Status: AC

## 2021-12-04 MED FILL — Dexamethasone Sodium Phosphate Inj 100 MG/10ML: INTRAMUSCULAR | Qty: 1 | Status: AC

## 2021-12-04 MED FILL — Nivolumab IV Soln 240 MG/24ML: INTRAVENOUS | Qty: 24 | Status: AC

## 2021-12-04 MED FILL — Fluorouracil IV Soln 5 GM/100ML (50 MG/ML): INTRAVENOUS | Qty: 76 | Status: AC

## 2021-12-04 MED FILL — Fluorouracil IV Soln 2.5 GM/50ML (50 MG/ML): INTRAVENOUS | Qty: 13 | Status: AC

## 2021-12-07 ENCOUNTER — Inpatient Hospital Stay: Payer: Medicare Other

## 2021-12-07 VITALS — BP 158/66 | HR 89 | Temp 98.7°F | Resp 18 | Ht 64.0 in | Wt 190.1 lb

## 2021-12-07 DIAGNOSIS — C786 Secondary malignant neoplasm of retroperitoneum and peritoneum: Secondary | ICD-10-CM | POA: Diagnosis not present

## 2021-12-07 DIAGNOSIS — R000 Tachycardia, unspecified: Secondary | ICD-10-CM | POA: Diagnosis not present

## 2021-12-07 DIAGNOSIS — T17920A Food in respiratory tract, part unspecified causing asphyxiation, initial encounter: Secondary | ICD-10-CM | POA: Diagnosis not present

## 2021-12-07 DIAGNOSIS — Z79899 Other long term (current) drug therapy: Secondary | ICD-10-CM | POA: Diagnosis not present

## 2021-12-07 DIAGNOSIS — C168 Malignant neoplasm of overlapping sites of stomach: Secondary | ICD-10-CM

## 2021-12-07 DIAGNOSIS — Z5111 Encounter for antineoplastic chemotherapy: Secondary | ICD-10-CM | POA: Diagnosis not present

## 2021-12-07 DIAGNOSIS — I10 Essential (primary) hypertension: Secondary | ICD-10-CM | POA: Diagnosis not present

## 2021-12-07 DIAGNOSIS — C169 Malignant neoplasm of stomach, unspecified: Secondary | ICD-10-CM | POA: Diagnosis not present

## 2021-12-07 MED ORDER — SODIUM CHLORIDE 0.9 % IV SOLN
240.0000 mg | Freq: Once | INTRAVENOUS | Status: AC
  Administered 2021-12-07: 240 mg via INTRAVENOUS
  Filled 2021-12-07: qty 24

## 2021-12-07 MED ORDER — SODIUM CHLORIDE 0.9 % IV SOLN
10.0000 mg | Freq: Once | INTRAVENOUS | Status: AC
  Administered 2021-12-07: 10 mg via INTRAVENOUS
  Filled 2021-12-07: qty 10

## 2021-12-07 MED ORDER — FLUOROURACIL CHEMO INJECTION 2.5 GM/50ML
320.0000 mg/m2 | Freq: Once | INTRAVENOUS | Status: AC
  Administered 2021-12-07: 650 mg via INTRAVENOUS
  Filled 2021-12-07: qty 13

## 2021-12-07 MED ORDER — PALONOSETRON HCL INJECTION 0.25 MG/5ML
0.2500 mg | Freq: Once | INTRAVENOUS | Status: AC
  Administered 2021-12-07: 0.25 mg via INTRAVENOUS
  Filled 2021-12-07: qty 5

## 2021-12-07 MED ORDER — OXALIPLATIN CHEMO INJECTION 100 MG/20ML
68.0000 mg/m2 | Freq: Once | INTRAVENOUS | Status: AC
  Administered 2021-12-07: 135 mg via INTRAVENOUS
  Filled 2021-12-07: qty 20

## 2021-12-07 MED ORDER — SODIUM CHLORIDE 0.9% FLUSH: 10.0000 mL | INTRAVENOUS | Status: DC | PRN

## 2021-12-07 MED ORDER — SODIUM CHLORIDE 0.9 % IV SOLN
1920.0000 mg/m2 | INTRAVENOUS | Status: DC
  Administered 2021-12-07: 3800 mg via INTRAVENOUS
  Filled 2021-12-07: qty 76

## 2021-12-07 MED ORDER — HEPARIN SOD (PORK) LOCK FLUSH 100 UNIT/ML IV SOLN: 500.0000 [IU] | Freq: Once | INTRAVENOUS | Status: DC | PRN

## 2021-12-07 MED ORDER — DEXTROSE 5 % IV SOLN: Freq: Once | INTRAVENOUS | Status: AC

## 2021-12-07 MED ORDER — SODIUM CHLORIDE 0.9 % IV SOLN
150.0000 mg | Freq: Once | INTRAVENOUS | Status: AC
  Administered 2021-12-07: 150 mg via INTRAVENOUS
  Filled 2021-12-07: qty 150

## 2021-12-07 MED ORDER — LEUCOVORIN CALCIUM INJECTION 350 MG
320.0000 mg/m2 | Freq: Once | INTRAVENOUS | Status: AC
  Administered 2021-12-07: 630 mg via INTRAVENOUS
  Filled 2021-12-07: qty 31.5

## 2021-12-07 NOTE — Patient Instructions (Signed)
Oxaliplatin Injection What is this medication? OXALIPLATIN (ox AL i PLA tin) treats some types of cancer. It works by slowing down the growth of cancer cells. This medicine may be used for other purposes; ask your health care provider or pharmacist if you have questions. COMMON BRAND NAME(S): Eloxatin What should I tell my care team before I take this medication? They need to know if you have any of these conditions: Heart disease History of irregular heartbeat or rhythm Liver disease Low blood cell levels (white cells, red cells, and platelets) Lung or breathing disease, such as asthma Take medications that treat or prevent blood clots Tingling of the fingers, toes, or other nerve disorder An unusual or allergic reaction to oxaliplatin, other medications, foods, dyes, or preservatives If you or your partner are pregnant or trying to get pregnant Breast-feeding How should I use this medication? This medication is injected into a vein. It is given by your care team in a hospital or clinic setting. Talk to your care team about the use of this medication in children. Special care may be needed. Overdosage: If you think you have taken too much of this medicine contact a poison control center or emergency room at once. NOTE: This medicine is only for you. Do not share this medicine with others. What if I miss a dose? Keep appointments for follow-up doses. It is important not to miss a dose. Call your care team if you are unable to keep an appointment. What may interact with this medication? Do not take this medication with any of the following: Cisapride Dronedarone Pimozide Thioridazine This medication may also interact with the following: Aspirin and aspirin-like medications Certain medications that treat or prevent blood clots, such as warfarin, apixaban, dabigatran, and rivaroxaban Cisplatin Cyclosporine Diuretics Medications for infection, such as acyclovir, adefovir, amphotericin  B, bacitracin, cidofovir, foscarnet, ganciclovir, gentamicin, pentamidine, vancomycin NSAIDs, medications for pain and inflammation, such as ibuprofen or naproxen Other medications that cause heart rhythm changes Pamidronate Zoledronic acid This list may not describe all possible interactions. Give your health care provider a list of all the medicines, herbs, non-prescription drugs, or dietary supplements you use. Also tell them if you smoke, drink alcohol, or use illegal drugs. Some items may interact with your medicine. What should I watch for while using this medication? Your condition will be monitored carefully while you are receiving this medication. You may need blood work while taking this medication. This medication may make you feel generally unwell. This is not uncommon as chemotherapy can affect healthy cells as well as cancer cells. Report any side effects. Continue your course of treatment even though you feel ill unless your care team tells you to stop. This medication may increase your risk of getting an infection. Call your care team for advice if you get a fever, chills, sore throat, or other symptoms of a cold or flu. Do not treat yourself. Try to avoid being around people who are sick. Avoid taking medications that contain aspirin, acetaminophen, ibuprofen, naproxen, or ketoprofen unless instructed by your care team. These medications may hide a fever. Be careful brushing or flossing your teeth or using a toothpick because you may get an infection or bleed more easily. If you have any dental work done, tell your dentist you are receiving this medication. This medication can make you more sensitive to cold. Do not drink cold drinks or use ice. Cover exposed skin before coming in contact with cold temperatures or cold objects. When out in  cold weather wear warm clothing and cover your mouth and nose to warm the air that goes into your lungs. Tell your care team if you get sensitive to  the cold. Talk to your care team if you or your partner are pregnant or think either of you might be pregnant. This medication can cause serious birth defects if taken during pregnancy and for 9 months after the last dose. A negative pregnancy test is required before starting this medication. A reliable form of contraception is recommended while taking this medication and for 9 months after the last dose. Talk to your care team about effective forms of contraception. Do not father a child while taking this medication and for 6 months after the last dose. Use a condom while having sex during this time period. Do not breastfeed while taking this medication and for 3 months after the last dose. This medication may cause infertility. Talk to your care team if you are concerned about your fertility. What side effects may I notice from receiving this medication? Side effects that you should report to your care team as soon as possible: Allergic reactions--skin rash, itching, hives, swelling of the face, lips, tongue, or throat Bleeding--bloody or black, tar-like stools, vomiting blood or brown material that looks like coffee grounds, red or dark brown urine, small red or purple spots on skin, unusual bruising or bleeding Dry cough, shortness of breath or trouble breathing Heart rhythm changes--fast or irregular heartbeat, dizziness, feeling faint or lightheaded, chest pain, trouble breathing Infection--fever, chills, cough, sore throat, wounds that don't heal, pain or trouble when passing urine, general feeling of discomfort or being unwell Liver injury--right upper belly pain, loss of appetite, nausea, light-colored stool, dark yellow or brown urine, yellowing skin or eyes, unusual weakness or fatigue Low red blood cell level--unusual weakness or fatigue, dizziness, headache, trouble breathing Muscle injury--unusual weakness or fatigue, muscle pain, dark yellow or brown urine, decrease in amount of  urine Pain, tingling, or numbness in the hands or feet Sudden and severe headache, confusion, change in vision, seizures, which may be signs of posterior reversible encephalopathy syndrome (PRES) Unusual bruising or bleeding Side effects that usually do not require medical attention (report to your care team if they continue or are bothersome): Diarrhea Nausea Pain, redness, or swelling with sores inside the mouth or throat Unusual weakness or fatigue Vomiting This list may not describe all possible side effects. Call your doctor for medical advice about side effects. You may report side effects to FDA at 1-800-FDA-1088. Where should I keep my medication? This medication is given in a hospital or clinic. It will not be stored at home. NOTE: This sheet is a summary. It may not cover all possible information. If you have questions about this medicine, talk to your doctor, pharmacist, or health care provider.  2023 [employer] Standard (2021-05-29 00:00:00) Nivolumab Injection What is this medication? NIVOLUMAB (nye VOL ue mab) treats some types of cancer. It works by helping your immune system slow or stop the spread of cancer cells. It is a monoclonal antibody. This medicine may be used for other purposes; ask your health care provider or pharmacist if you have questions. COMMON BRAND NAME(S): Opdivo What should I tell my care team before I take this medication? They need to know if you have any of these conditions: Allogeneic stem cell transplant (uses someone else's stem cells) Autoimmune diseases, such as Crohn disease, ulcerative colitis, lupus History of chest radiation Nervous system problems, such as Guillain-Barre  syndrome or myasthenia gravis Organ transplant An unusual or allergic reaction to nivolumab, other medications, foods, dyes, or preservatives Pregnant or trying to get pregnant Breast-feeding How should I use this medication? This medication is infused into a vein.  It is given in a hospital or clinic setting. A special MedGuide will be given to you before each treatment. Be sure to read this information carefully each time. Talk to your care team about the use of this medication in children. While it may be prescribed for children as young as 12 years for selected conditions, precautions do apply. Overdosage: If you think you have taken too much of this medicine contact a poison control center or emergency room at once. NOTE: This medicine is only for you. Do not share this medicine with others. What if I miss a dose? Keep appointments for follow-up doses. It is important not to miss your dose. Call your care team if you are unable to keep an appointment. What may interact with this medication? Interactions have not been studied. This list may not describe all possible interactions. Give your health care provider a list of all the medicines, herbs, non-prescription drugs, or dietary supplements you use. Also tell them if you smoke, drink alcohol, or use illegal drugs. Some items may interact with your medicine. What should I watch for while using this medication? Your condition will be monitored carefully while you are receiving this medication. You may need blood work while taking this medication. This medication may cause serious skin reactions. They can happen weeks to months after starting the medication. Contact your care team right away if you notice fevers or flu-like symptoms with a rash. The rash may be red or purple and then turn into blisters or peeling of the skin. You may also notice a red rash with swelling of the face, lips, or lymph nodes in your neck or under your arms. Tell your care team right away if you have any change in your eyesight. Talk to your care team if you are pregnant or think you might be pregnant. A negative pregnancy test is required before starting this medication. A reliable form of contraception is recommended while taking  this medication and for 5 months after the last dose. Talk to your care team about effective forms of contraception. Do not breast-feed while taking this medication and for 5 months after the last dose. What side effects may I notice from receiving this medication? Side effects that you should report to your care team as soon as possible: Allergic reactions--skin rash, itching, hives, swelling of the face, lips, tongue, or throat Dry cough, shortness of breath or trouble breathing Eye pain, redness, irritation, or discharge with blurry or decreased vision Heart muscle inflammation--unusual weakness or fatigue, shortness of breath, chest pain, fast or irregular heartbeat, dizziness, swelling of the ankles, feet, or hands Hormone gland problems--headache, sensitivity to light, unusual weakness or fatigue, dizziness, fast or irregular heartbeat, increased sensitivity to cold or heat, excessive sweating, constipation, hair loss, increased thirst or amount of urine, tremors or shaking, irritability Infusion reactions--chest pain, shortness of breath or trouble breathing, feeling faint or lightheaded Kidney injury (glomerulonephritis)--decrease in the amount of urine, red or dark brown urine, foamy or bubbly urine, swelling of the ankles, hands, or feet Liver injury--right upper belly pain, loss of appetite, nausea, light-colored stool, dark yellow or brown urine, yellowing skin or eyes, unusual weakness or fatigue Pain, tingling, or numbness in the hands or feet, muscle weakness, change in  vision, confusion or trouble speaking, loss of balance or coordination, trouble walking, seizures Rash, fever, and swollen lymph nodes Redness, blistering, peeling, or loosening of the skin, including inside the mouth Sudden or severe stomach pain, bloody diarrhea, fever, nausea, vomiting Side effects that usually do not require medical attention (report these to your care team if they continue or are  bothersome): Bone, joint, or muscle pain Diarrhea Fatigue Loss of appetite Nausea Skin rash This list may not describe all possible side effects. Call your doctor for medical advice about side effects. You may report side effects to FDA at 1-800-FDA-1088. Where should I keep my medication? This medication is given in a hospital or clinic. It will not be stored at home. NOTE: This sheet is a summary. It may not cover all possible information. If you have questions about this medicine, talk to your doctor, pharmacist, or health care provider.  2023 [employer]/Gold Standard (2021-01-02 00:00:00) Leucovorin Injection What is this medication? LEUCOVORIN (loo koe VOR in) prevents side effects from certain medications, such as methotrexate. It works by increasing folate levels. This helps protect healthy cells in your body. It may also be used to treat anemia caused by low levels of folate. It can also be used with fluorouracil, a type of chemotherapy, to treat colorectal cancer. It works by increasing the effects of fluorouracil in the body. This medicine may be used for other purposes; ask your health care provider or pharmacist if you have questions. What should I tell my care team before I take this medication? They need to know if you have any of these conditions: Anemia from low levels of vitamin B12 in the blood An unusual or allergic reaction to leucovorin, folic acid, other medications, foods, dyes, or preservatives Pregnant or trying to get pregnant Breastfeeding How should I use this medication? This medication is injected into a vein or a muscle. It is given by your care team in a hospital or clinic setting. Talk to your care team about the use of this medication in children. Special care may be needed. Overdosage: If you think you have taken too much of this medicine contact a poison control center or emergency room at once. NOTE: This medicine is only for you. Do not share this  medicine with others. What if I miss a dose? Keep appointments for follow-up doses. It is important not to miss your dose. Call your care team if you are unable to keep an appointment. What may interact with this medication? Capecitabine Fluorouracil Phenobarbital Phenytoin Primidone Trimethoprim;sulfamethoxazole This list may not describe all possible interactions. Give your health care provider a list of all the medicines, herbs, non-prescription drugs, or dietary supplements you use. Also tell them if you smoke, drink alcohol, or use illegal drugs. Some items may interact with your medicine. What should I watch for while using this medication? Your condition will be monitored carefully while you are receiving this medication. This medication may increase the side effects of 5-fluorouracil. Tell your care team if you have diarrhea or mouth sores that do not get better or that get worse. What side effects may I notice from receiving this medication? Side effects that you should report to your care team as soon as possible: Allergic reactions--skin rash, itching, hives, swelling of the face, lips, tongue, or throat This list may not describe all possible side effects. Call your doctor for medical advice about side effects. You may report side effects to FDA at 1-800-FDA-1088. Where should I keep my  medication? This medication is given in a hospital or clinic. It will not be stored at home. NOTE: This sheet is a summary. It may not cover all possible information. If you have questions about this medicine, talk to your doctor, pharmacist, or health care provider.  2023 [employer]/Gold Standard (2021-06-12 00:00:00) Fluorouracil Injection What is this medication? FLUOROURACIL (flure oh YOOR a sil) treats some types of cancer. It works by slowing down the growth of cancer cells. This medicine may be used for other purposes; ask your health care provider or pharmacist if you have  questions. COMMON BRAND NAME(S): Adrucil What should I tell my care team before I take this medication? They need to know if you have any of these conditions: Blood disorders Dihydropyrimidine dehydrogenase (DPD) deficiency Infection, such as chickenpox, cold sores, herpes Kidney disease Liver disease Poor nutrition Recent or ongoing radiation therapy An unusual or allergic reaction to fluorouracil, other medications, foods, dyes, or preservatives If you or your partner are pregnant or trying to get pregnant Breast-feeding How should I use this medication? This medication is injected into a vein. It is administered by your care team in a hospital or clinic setting. Talk to your care team about the use of this medication in children. Special care may be needed. Overdosage: If you think you have taken too much of this medicine contact a poison control center or emergency room at once. NOTE: This medicine is only for you. Do not share this medicine with others. What if I miss a dose? Keep appointments for follow-up doses. It is important not to miss your dose. Call your care team if you are unable to keep an appointment. What may interact with this medication? Do not take this medication with any of the following: Live virus vaccines This medication may also interact with the following: Medications that treat or prevent blood clots, such as warfarin, enoxaparin, dalteparin This list may not describe all possible interactions. Give your health care provider a list of all the medicines, herbs, non-prescription drugs, or dietary supplements you use. Also tell them if you smoke, drink alcohol, or use illegal drugs. Some items may interact with your medicine. What should I watch for while using this medication? Your condition will be monitored carefully while you are receiving this medication. This medication may make you feel generally unwell. This is not uncommon as chemotherapy can affect  healthy cells as well as cancer cells. Report any side effects. Continue your course of treatment even though you feel ill unless your care team tells you to stop. In some cases, you may be given additional medications to help with side effects. Follow all directions for their use. This medication may increase your risk of getting an infection. Call your care team for advice if you get a fever, chills, sore throat, or other symptoms of a cold or flu. Do not treat yourself. Try to avoid being around people who are sick. This medication may increase your risk to bruise or bleed. Call your care team if you notice any unusual bleeding. Be careful brushing or flossing your teeth or using a toothpick because you may get an infection or bleed more easily. If you have any dental work done, tell your dentist you are receiving this medication. Avoid taking medications that contain aspirin, acetaminophen, ibuprofen, naproxen, or ketoprofen unless instructed by your care team. These medications may hide a fever. Do not treat diarrhea with over the counter products. Contact your care team if you have diarrhea  that lasts more than 2 days or if it is severe and watery. This medication can make you more sensitive to the sun. Keep out of the sun. If you cannot avoid being in the sun, wear protective clothing and sunscreen. Do not use sun lamps, tanning beds, or tanning booths. Talk to your care team if you or your partner wish to become pregnant or think you might be pregnant. This medication can cause serious birth defects if taken during pregnancy and for 3 months after the last dose. A reliable form of contraception is recommended while taking this medication and for 3 months after the last dose. Talk to your care team about effective forms of contraception. Do not father a child while taking this medication and for 3 months after the last dose. Use a condom while having sex during this time period. Do not breastfeed  while taking this medication. This medication may cause infertility. Talk to your care team if you are concerned about your fertility. What side effects may I notice from receiving this medication? Side effects that you should report to your care team as soon as possible: Allergic reactions--skin rash, itching, hives, swelling of the face, lips, tongue, or throat Heart attack--pain or tightness in the chest, shoulders, arms, or jaw, nausea, shortness of breath, cold or clammy skin, feeling faint or lightheaded Heart failure--shortness of breath, swelling of the ankles, feet, or hands, sudden weight gain, unusual weakness or fatigue Heart rhythm changes--fast or irregular heartbeat, dizziness, feeling faint or lightheaded, chest pain, trouble breathing High ammonia level--unusual weakness or fatigue, confusion, loss of appetite, nausea, vomiting, seizures Infection--fever, chills, cough, sore throat, wounds that don't heal, pain or trouble when passing urine, general feeling of discomfort or being unwell Low red blood cell level--unusual weakness or fatigue, dizziness, headache, trouble breathing Pain, tingling, or numbness in the hands or feet, muscle weakness, change in vision, confusion or trouble speaking, loss of balance or coordination, trouble walking, seizures Redness, swelling, and blistering of the skin over hands and feet Severe or prolonged diarrhea Unusual bruising or bleeding Side effects that usually do not require medical attention (report to your care team if they continue or are bothersome): Dry skin Headache Increased tears Nausea Pain, redness, or swelling with sores inside the mouth or throat Sensitivity to light Vomiting This list may not describe all possible side effects. Call your doctor for medical advice about side effects. You may report side effects to FDA at 1-800-FDA-1088. Where should I keep my medication? This medication is given in a hospital or clinic. It  will not be stored at home. NOTE: This sheet is a summary. It may not cover all possible information. If you have questions about this medicine, talk to your doctor, pharmacist, or health care provider.  2023 [employer] Standard (2021-06-09 00:00:00)

## 2021-12-08 DIAGNOSIS — I10 Essential (primary) hypertension: Secondary | ICD-10-CM | POA: Diagnosis not present

## 2021-12-08 DIAGNOSIS — R0989 Other specified symptoms and signs involving the circulatory and respiratory systems: Secondary | ICD-10-CM | POA: Diagnosis not present

## 2021-12-08 DIAGNOSIS — I700 Atherosclerosis of aorta: Secondary | ICD-10-CM | POA: Diagnosis not present

## 2021-12-08 DIAGNOSIS — R1310 Dysphagia, unspecified: Secondary | ICD-10-CM | POA: Diagnosis not present

## 2021-12-08 DIAGNOSIS — R0602 Shortness of breath: Secondary | ICD-10-CM | POA: Diagnosis not present

## 2021-12-09 ENCOUNTER — Inpatient Hospital Stay: Payer: Medicare Other

## 2021-12-09 VITALS — BP 173/77 | HR 79 | Temp 98.7°F | Resp 20

## 2021-12-09 DIAGNOSIS — C786 Secondary malignant neoplasm of retroperitoneum and peritoneum: Secondary | ICD-10-CM | POA: Diagnosis not present

## 2021-12-09 DIAGNOSIS — Z79899 Other long term (current) drug therapy: Secondary | ICD-10-CM | POA: Diagnosis not present

## 2021-12-09 DIAGNOSIS — C168 Malignant neoplasm of overlapping sites of stomach: Secondary | ICD-10-CM

## 2021-12-09 DIAGNOSIS — C169 Malignant neoplasm of stomach, unspecified: Secondary | ICD-10-CM | POA: Diagnosis not present

## 2021-12-09 DIAGNOSIS — Z5111 Encounter for antineoplastic chemotherapy: Secondary | ICD-10-CM | POA: Diagnosis not present

## 2021-12-09 MED ORDER — HEPARIN SOD (PORK) LOCK FLUSH 100 UNIT/ML IV SOLN
500.0000 [IU] | Freq: Once | INTRAVENOUS | Status: AC | PRN
  Administered 2021-12-09: 500 [IU]

## 2021-12-09 MED ORDER — SODIUM CHLORIDE 0.9% FLUSH
10.0000 mL | INTRAVENOUS | Status: DC | PRN
  Administered 2021-12-09: 10 mL

## 2021-12-09 NOTE — Patient Instructions (Signed)
Fluorouracil Injection What is this medication? FLUOROURACIL (flure oh YOOR a sil) treats some types of cancer. It works by slowing down the growth of cancer cells. This medicine may be used for other purposes; ask your health care provider or pharmacist if you have questions. COMMON BRAND NAME(S): Adrucil What should I tell my care team before I take this medication? They need to know if you have any of these conditions: Blood disorders Dihydropyrimidine dehydrogenase (DPD) deficiency Infection, such as chickenpox, cold sores, herpes Kidney disease Liver disease Poor nutrition Recent or ongoing radiation therapy An unusual or allergic reaction to fluorouracil, other medications, foods, dyes, or preservatives If you or your partner are pregnant or trying to get pregnant Breast-feeding How should I use this medication? This medication is injected into a vein. It is administered by your care team in a hospital or clinic setting. Talk to your care team about the use of this medication in children. Special care may be needed. Overdosage: If you think you have taken too much of this medicine contact a poison control center or emergency room at once. NOTE: This medicine is only for you. Do not share this medicine with others. What if I miss a dose? Keep appointments for follow-up doses. It is important not to miss your dose. Call your care team if you are unable to keep an appointment. What may interact with this medication? Do not take this medication with any of the following: Live virus vaccines This medication may also interact with the following: Medications that treat or prevent blood clots, such as warfarin, enoxaparin, dalteparin This list may not describe all possible interactions. Give your health care provider a list of all the medicines, herbs, non-prescription drugs, or dietary supplements you use. Also tell them if you smoke, drink alcohol, or use illegal drugs. Some items may  interact with your medicine. What should I watch for while using this medication? Your condition will be monitored carefully while you are receiving this medication. This medication may make you feel generally unwell. This is not uncommon as chemotherapy can affect healthy cells as well as cancer cells. Report any side effects. Continue your course of treatment even though you feel ill unless your care team tells you to stop. In some cases, you may be given additional medications to help with side effects. Follow all directions for their use. This medication may increase your risk of getting an infection. Call your care team for advice if you get a fever, chills, sore throat, or other symptoms of a cold or flu. Do not treat yourself. Try to avoid being around people who are sick. This medication may increase your risk to bruise or bleed. Call your care team if you notice any unusual bleeding. Be careful brushing or flossing your teeth or using a toothpick because you may get an infection or bleed more easily. If you have any dental work done, tell your dentist you are receiving this medication. Avoid taking medications that contain aspirin, acetaminophen, ibuprofen, naproxen, or ketoprofen unless instructed by your care team. These medications may hide a fever. Do not treat diarrhea with over the counter products. Contact your care team if you have diarrhea that lasts more than 2 days or if it is severe and watery. This medication can make you more sensitive to the sun. Keep out of the sun. If you cannot avoid being in the sun, wear protective clothing and sunscreen. Do not use sun lamps, tanning beds, or tanning booths. Talk to   your care team if you or your partner wish to become pregnant or think you might be pregnant. This medication can cause serious birth defects if taken during pregnancy and for 3 months after the last dose. A reliable form of contraception is recommended while taking this  medication and for 3 months after the last dose. Talk to your care team about effective forms of contraception. Do not father a child while taking this medication and for 3 months after the last dose. Use a condom while having sex during this time period. Do not breastfeed while taking this medication. This medication may cause infertility. Talk to your care team if you are concerned about your fertility. What side effects may I notice from receiving this medication? Side effects that you should report to your care team as soon as possible: Allergic reactions--skin rash, itching, hives, swelling of the face, lips, tongue, or throat Heart attack--pain or tightness in the chest, shoulders, arms, or jaw, nausea, shortness of breath, cold or clammy skin, feeling faint or lightheaded Heart failure--shortness of breath, swelling of the ankles, feet, or hands, sudden weight gain, unusual weakness or fatigue Heart rhythm changes--fast or irregular heartbeat, dizziness, feeling faint or lightheaded, chest pain, trouble breathing High ammonia level--unusual weakness or fatigue, confusion, loss of appetite, nausea, vomiting, seizures Infection--fever, chills, cough, sore throat, wounds that don't heal, pain or trouble when passing urine, general feeling of discomfort or being unwell Low red blood cell level--unusual weakness or fatigue, dizziness, headache, trouble breathing Pain, tingling, or numbness in the hands or feet, muscle weakness, change in vision, confusion or trouble speaking, loss of balance or coordination, trouble walking, seizures Redness, swelling, and blistering of the skin over hands and feet Severe or prolonged diarrhea Unusual bruising or bleeding Side effects that usually do not require medical attention (report to your care team if they continue or are bothersome): Dry skin Headache Increased tears Nausea Pain, redness, or swelling with sores inside the mouth or throat Sensitivity  to light Vomiting This list may not describe all possible side effects. Call your doctor for medical advice about side effects. You may report side effects to FDA at 1-800-FDA-1088. Where should I keep my medication? This medication is given in a hospital or clinic. It will not be stored at home. NOTE: This sheet is a summary. It may not cover all possible information. If you have questions about this medicine, talk to your doctor, pharmacist, or health care provider.  2023 [employer] (2021-06-02 00:00:00)  

## 2021-12-15 ENCOUNTER — Encounter: Payer: Self-pay | Admitting: Oncology

## 2021-12-16 ENCOUNTER — Ambulatory Visit: Payer: Self-pay | Admitting: Genetic Counselor

## 2021-12-16 ENCOUNTER — Telehealth: Payer: Self-pay | Admitting: Genetic Counselor

## 2021-12-16 DIAGNOSIS — J479 Bronchiectasis, uncomplicated: Secondary | ICD-10-CM | POA: Diagnosis not present

## 2021-12-16 DIAGNOSIS — Z1501 Genetic susceptibility to malignant neoplasm of breast: Secondary | ICD-10-CM

## 2021-12-16 DIAGNOSIS — C168 Malignant neoplasm of overlapping sites of stomach: Secondary | ICD-10-CM | POA: Diagnosis not present

## 2021-12-16 DIAGNOSIS — N281 Cyst of kidney, acquired: Secondary | ICD-10-CM | POA: Diagnosis not present

## 2021-12-16 DIAGNOSIS — C169 Malignant neoplasm of stomach, unspecified: Secondary | ICD-10-CM | POA: Diagnosis not present

## 2021-12-16 DIAGNOSIS — Z1379 Encounter for other screening for genetic and chromosomal anomalies: Secondary | ICD-10-CM

## 2021-12-16 LAB — BASIC METABOLIC PANEL
BUN: 11 (ref 4–21)
CO2: 26 — AB (ref 13–22)
Chloride: 105 (ref 99–108)
Creatinine: 0.8 (ref 0.5–1.1)
Glucose: 113
Potassium: 3.7 mEq/L (ref 3.5–5.1)
Sodium: 138 (ref 137–147)

## 2021-12-16 LAB — CBC AND DIFFERENTIAL
HCT: 39 (ref 36–46)
Hemoglobin: 13.6 (ref 12.0–16.0)
Neutrophils Absolute: 2.42
Platelets: 143 10*3/uL — AB (ref 150–400)
WBC: 5.5

## 2021-12-16 LAB — COMPREHENSIVE METABOLIC PANEL
Albumin: 4.3 (ref 3.5–5.0)
Calcium: 9.5 (ref 8.7–10.7)

## 2021-12-16 LAB — HEPATIC FUNCTION PANEL
ALT: 23 U/L (ref 7–35)
AST: 29 (ref 13–35)
Alkaline Phosphatase: 85 (ref 25–125)
Bilirubin, Total: 0.6

## 2021-12-16 LAB — CBC: RBC: 4.43 (ref 3.87–5.11)

## 2021-12-16 NOTE — Telephone Encounter (Signed)
Revealed low penetrance CHEK2 variant.  Discussed very slight increase in breast cancer risk and slight increase chance in colon cancer risk.  Discussed no changes in management indicated for her at this time.  Recommended family testing.

## 2021-12-17 NOTE — Progress Notes (Signed)
[facility]  [address] [place],  [place]  [address] [phone]  Clinic Day: 12/18/21  Referring physician: Ernestene Kiel, MD   ASSESSMENT & PLAN:   Gastric adenocarcinoma This is a poorly differentiated adenocarcinoma with signet ring features and ulceration.  I would consider it as metastatic to the omentum and so that would stage this as a T4 N0 M1, stage IVB.  PET scan did not show evidence of metastatic disease other than the omentum. She is on treatment with chemotherapy of FOLFOX and immunotherapy with nivolumab. Stain for HER2 was negative at 0. She was tolerating treatment well other than mild nausea and mouth sores. However she did experience laryngospasm/neuropathy with this last dose which frightened her and necessitated a visit to the ER. I have reassured her and counseled her on measures to avoid this again. Her current scans 12/16/21 show good response to treatment after 3 months.   I have reassured her about the recent neuropathy she experienced and discussed ways to avoid this. She is responding well after 3 months, and I recommend at least 3 more. I will decrease her oxaliplatin dose by another 20% for a total of a 40% dose reduction, given her age and toxicities. She does have some expected anxiety. We will proceed with her 7th dose. I have reviewed the scan information with multiple family members today and answered their questions. They understand and agree with this plan of care.   I provided 40 minutes of face-to-face time during this this encounter and > 50% was spent counseling as documented under my assessment and plan.    Derwood Kaplan, MD [facility] [address] [place] [address] Dept: [phone] Dept Fax: [phone]   CHIEF COMPLAINT:  CC: Gastric cancer  Current Treatment: Chemotherapy/immunotherapy   HISTORY OF PRESENT ILLNESS:   Mackenzie Lewis is a 86 y.o. female with a history of gastric cancer who is referred in consultation with Dr. Sandria Senter for assessment and management.  She had noticed that she was having regurgitation when eating and had lost over 30 pounds.  An ultrasound was done, revealing hepatic steatosis, and led to an MRI scan on June 5 which revealed gastric wall thickening with confluent nodularity of the omentum anterior to the stomach measuring 3.5 cm consistent with metastatic tumor.  She also had low-grade edema and wall thickening extending into the duodenum from the stomach.  She was referred to Dr. Sandria Senter and he did an EGD on July 7.  This revealed a large ulceration measuring 1.2 cm along the greater curvature.  She also had diffusely edematous and erythematous wall with erosions of the antrum and stiff and friable mucosa with oozing of blood.  These findings extended to the gastric fundus as well.  Pathology revealed a poorly differentiated adenocarcinoma with signet ring features from the biopsies of the ulcer as well as the antrum and the fundus of the stomach.  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  She was placed on omeprazole.  Her test for H. pylori was negative.  She was referred to Dr. Kendell Bane for consideration of surgery but he felt this was not resectable because of the extensive involvement and I agree.  I would consider this extending to the duodenum and also metastatic to the omentum.  PET scan confirmed these findings and she wished to pursue systemic intravenous therapy.  She continues to eat and has adjusted her diet to softer foods and liquids and is drinking boost so she has maintained her weight.  She does have some Zofran ODT for nausea when needed.  She has had some anorexia, nausea, occasional vomiting, early satiety, dysphagia, and belching. HER 2 testing was zero. A CEA and CA 19-9 were normal. She has been started  on FOLFOX chemotherapy along with immunotherapy.  INTERVAL HISTORY:  I have reviewed her chart and materials related to her cancer extensively and collaborated history with the patient. Summary of oncologic history is as follows: Oncology History  Gastric cancer ([facility])  09/10/2021 Initial Diagnosis   Gastric cancer ([facility])   09/10/2021 Cancer Staging   Staging form: Stomach, AJCC 8th Edition - Clinical stage from 09/10/2021: Stage IVB (cT4b, cN0, cM1) - Signed by Derwood Kaplan, MD on 09/10/2021 Histopathologic type: Adenocarcinoma, NOS Stage prefix: Initial diagnosis Total positive nodes: 0 Histologic grade (G): G3 Histologic grading system: 3 grade system Sites of metastasis: Peritoneal surface Diagnostic confirmation: Positive histology PLUS positive immunophenotyping and/or positive genetic studies Specimen type: Endoscopy with Biopsy Staged by: Managing physician Carcinoembryonic antigen (CEA) (ng/mL): 2.8 Carbohydrate antigen 19-9 (CA 19-9) (U/mL): 4.9 HER2 status: Unknown Microsatellite instability (MSI): Unknown Tumor location in stomach: Other Clinical staging modalities: Biopsy, Endoscopy Stage used in treatment planning: Yes National guidelines used in treatment planning: Yes Type of national guideline used in treatment planning: NCCN   09/28/2021 - 10/14/2021 Chemotherapy   Patient is on Treatment Plan : GASTROESOPHAGEAL FOLFOX + Nivolumab q14d     09/28/2021 -  Chemotherapy   Patient is on Treatment Plan : GASTROESOPHAGEAL FOLFOX + Nivolumab q14d     12/09/2021 Genetic Testing   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  Report date is 12/09/2021.   The Multi-Cancer + RNA Panel offered by [facility] includes sequencing and/or deletion/duplication analysis of the following 84 genes:  AIP*, ALK, APC*, ATM*, AXIN2*, BAP1*, BARD1*, BLM*, BMPR1A*, BRCA1*, BRCA2*, BRIP1*, CASR, CDC73*, CDH1*, CDK4, CDKN1B*, CDKN1C*, CDKN2A, CEBPA,  CHEK2*, CTNNA1*, DICER1*, DIS3L2*, EGFR, EPCAM, FH*, FLCN*, GATA2*, GPC3, GREM1, HOXB13, HRAS, KIT, MAX*, MEN1*, MET, MITF, MLH1*, MSH2*, MSH3*, MSH6*, MUTYH*, NBN*, NF1*, NF2*, NTHL1*, PALB2*, PDGFRA, PHOX2B, PMS2*, POLD1*, POLE*, POT1*, PRKAR1A*, PTCH1*, PTEN*, RAD50*, RAD51C*, RAD51D*, RB1*, RECQL4, RET, RUNX1*, SDHA*, SDHAF2*, SDHB*, SDHC*, SDHD*, SMAD4*, SMARCA4*, SMARCB1*, SMARCE1*, STK11*, SUFU*, TERC, TERT, TMEM127*, Tp53*, TSC1*, TSC2*, VHL*, WRN*, and WT1.  RNA analysis is performed for * genes.    INTERVAL HISTORY Mackenzie Lewis is seen in the clinic for follow up of her gastric cancer. After her last dose, she experienced neuropathy in the form of laryngospasm necessitating a visit to the ER. Evaluation there was negative. I have reassured her about this and discussed ways to avoid this again but I will also plan a further dose reduction of the oxaliplatin. She has anxiety today, understandably so. She is having some blood when blowing her nose but clear mucous so I recommended she let me know if she develops purulent sputum or fever. She denies fever, chills, night sweats, or other signs of infection. She denies cardiorespiratory and gastrointestinal issues. She  denies pain. Her appetite is good and her weight is stable. Her CBC and CMP are unremarkable. CT scan shows the gastric wall thickening is mild and decreased. The perigastric omental nodularity is decreased, from 2.2 cm to 1.4 cm in thickness. This is consistent with response to therapy and no new or progressive disease is seen. She  has an irregular right lower lobe nodule which was present in 2018 and multiple smaller nodules, also similar to 2018. There is evidence of progressive interstitial lung disease that could represent postinfectious/inflammatory or non-specific pneumonitis, but was also present in 2018. I reviewed this with the patient and her family.  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  If that happens, she is to call us so that we can schedule her for an ultrasound to look for synovitis.     Essential hypertension 09/09/2016   Gastric cancer ([facility]) 09/10/2021   Hyperlipidemia 09/01/2016   Hypertension    Hypothyroidism 09/01/2016   Osteoarthritis of both feet 01/20/2016   Osteoarthritis, hand 01/20/2016   Thyroid disease   Degenerative disc disease History of endometriosis  Past Surgical History:  Procedure Laterality Date   APPENDECTOMY     LAPAROSCOPIC APPENDECTOMY N/A 04/10/2016   Procedure: APPENDECTOMY LAPAROSCOPIC;  Surgeon: Coralie Keens, MD;  Location: [facility];  Service: General;  Laterality: N/A;  Bilateral tubal ligation Total hysterectomy and bilateral salpingo-oophorectomy in 1980  Family History  Problem Relation Age of Onset   Hypertension Mother    Prostate cancer Father        metastatic; d. 24   Brain cancer Sister 43   Breast cancer Sister 60   AAA (abdominal aortic aneurysm) Brother    Leukemia Cousin        x2 maternal female cousins; d. before 25   Breast cancer Daughter 8       DCIS  Her sister has had breast cancer as well as a tumor of her head Her daughter has had breast cancer last year  Social History:  reports that she has never smoked. She has never used smokeless tobacco. She reports that she does not currently use alcohol. She reports that she does not use drugs.The patient is accompanied by her son, daughter-in-law and daughter today.  She  is single but lives with a significant other.  She has the 2 children.  She worked in [employer] (medical) and denies any chemical or toxin exposures.  She grew up in [place] and [place].  She is active and healthy, especially for her age.  Allergies:  Allergies  Allergen Reactions   Sulfa Antibiotics Rash    Other reaction(s): Other (See Comments) "Made me feel weird"    Current Medications: Current Outpatient Medications  Medication Sig Dispense Refill   aspirin 81 MG EC tablet Take 81 mg by mouth daily. Swallow whole.     Calcium Carbonate (CALCIUM 600 PO) Take 1 tablet by mouth daily.     Cholecalciferol (VITAMIN D3) 5000 units CAPS Take 1 capsule by mouth daily.     EXFORGE HCT 5-160-12.5 MG TABS Take 1 tablet by mouth daily.     famotidine (PEPCID) 40 MG tablet Take 40 mg by mouth at bedtime.     KRILL OIL PO Take 1 capsule by mouth daily. Unknown strenght     levothyroxine (SYNTHROID, LEVOTHROID) 75  MCG tablet Take 75 mcg by mouth daily before breakfast.      LORazepam (ATIVAN) 0.5 MG tablet Take 1 tablet (0.5 mg total) by mouth at bedtime. 30 tablet 0   NON FORMULARY MMW: 3 parts Maalox 2 parts Benadryl 1 part viscious lidicaine  Disp. 6oz  Instructions: 2m swish and swallow every 3-4 hours     omeprazole (PRILOSEC) 40 MG capsule Take 1 capsule (40 mg total) by mouth 2 (two) times daily. 60 capsule 5   ondansetron (ZOFRAN) 4 MG tablet Take 4 mg by mouth every 4 (four) hours as needed.     ondansetron (ZOFRAN-ODT) 4 MG disintegrating tablet Take 1 tablet (4 mg total) by mouth every 8 (eight) hours as needed for nausea or vomiting. 90 tablet 0   polyethylene glycol powder (GLYCOLAX/MIRALAX) 17 GM/SCOOP powder SMARTSIG:1 scoopful By Mouth Daily     potassium chloride SA (KLOR-CON M) 20 MEQ tablet Take 1 tablet (20 mEq total) by mouth daily. 30 tablet 5   pravastatin (PRAVACHOL) 20 MG tablet Take 1 tablet (20 mg total) by mouth every evening. 90 tablet 3   Probiotic Product (PROBIOTIC DAILY  PO) Take 1 tablet by mouth daily.     prochlorperazine (COMPAZINE) 10 MG tablet Take 1 tablet (10 mg total) by mouth every 6 (six) hours as needed for nausea or vomiting. 90 tablet 3   No current facility-administered medications for this visit.    REVIEW OF SYSTEMS:  Review of Systems  Eyes: Negative.   Respiratory: Negative.    Cardiovascular: Negative.   Gastrointestinal:  Positive for constipation and nausea.       She does have some dysphagia and regurgitation with certain foods.  She has to eat very slowly.  Genitourinary: Negative.    Musculoskeletal: Negative.   Skin: Negative.   Neurological: Negative.   Hematological: Negative.   Psychiatric/Behavioral: Negative.        VITALS:  Blood pressure (!) 164/75, pulse 88, temperature 97.6 F (36.4 C), temperature source Oral, resp. rate 18, height _0  (1.626 m), weight 189 lb 6.4 oz (85.9 kg), SpO2 97 %.  Wt Readings from Last 3 Encounters:  01/04/22 190 lb 1.3 oz (86.2 kg)  12/31/21 191 lb (86.6 kg)  12/23/21 195 lb (88.5 kg)    Body mass index is 32.51 kg/m.  Performance status (ECOG): 1 - Symptomatic but completely ambulatory  PHYSICAL EXAM:  Physical Exam Constitutional:      General: She is not in acute distress.    Appearance: Normal appearance. She is not ill-appearing or toxic-appearing.  HENT:     Head: Normocephalic and atraumatic.     Nose: Nose normal.     Mouth/Throat:     Pharynx: Oropharynx is clear.  Eyes:     General:        Right eye: No discharge.        Left eye: No discharge.     Extraocular Movements: Extraocular movements intact.     Conjunctiva/sclera: Conjunctivae normal.     Pupils: Pupils are equal, round, and reactive to light.  Cardiovascular:     Rate and Rhythm: Normal rate and regular rhythm.     Pulses: Normal pulses.     Heart sounds: Normal heart sounds. No murmur heard.    No gallop.  Pulmonary:     Effort: Pulmonary effort is normal. No respiratory distress.      Breath sounds: Normal breath sounds. No wheezing or rales.  Abdominal:  General: Bowel sounds are normal.     Palpations: Abdomen is soft. There is mass.     Comments: She has a long mass effect in the epigastric area measuring at least 12 to 15 cm long and hard to palpation  Musculoskeletal:        General: Normal range of motion.     Cervical back: Normal range of motion and neck supple.  Skin:    General: Skin is warm and dry.  Neurological:     General: No focal deficit present.     Mental Status: She is alert and oriented to person, place, and time.  Psychiatric:        Mood and Affect: Mood normal.        Behavior: Behavior normal.        Thought Content: Thought content normal.        Judgment: Judgment normal.     LABS:      Latest Ref Rng & Units 12/31/2021   12:00 AM 12/16/2021   12:00 AM 12/03/2021   12:00 AM  CBC  WBC  5.6     5.5     6.7      Hemoglobin 12.0 - 16.0 13.2     13.6     12.8      Hematocrit 36 - 46 39     39     37      Platelets 150 - 400 K/uL 137     143     152         This result is from an external source.      Latest Ref Rng & Units 12/31/2021    9:09 AM 12/16/2021   12:00 AM 12/03/2021   12:00 AM  CMP  Glucose 70 - 99 mg/dL 152     BUN 8 - 23 mg/dL _0 Creatinine 0.44 - 1.00 mg/dL 1.05  0.8     0.8      Sodium 135 - 145 mmol/L 141  138     138      Potassium 3.5 - 5.1 mmol/L 4.1  3.7     3.1      Chloride 98 - 111 mmol/L 109  105     104      CO2 22 - 32 mmol/L _1 Calcium 8.9 - 10.3 mg/dL 9.3  9.5     9.1  C     Total Protein 6.5 - 8.1 g/dL 6.6     Total Bilirubin 0.3 - 1.2 mg/dL 0.6     Alkaline Phos 38 - 126 U/L 70  85     80      AST 15 - 41 U/L _2 ALT 0 - 44 U/L _3 C Corrected result   This result is from an external source.     Lab Results  Component Value Date   CEA1 2.9 09/10/2021   /  CEA  Date Value Ref Range Status  09/10/2021 2.9 0.0 - 4.7  ng/mL Final    Comment:    (NOTE)  Nonsmokers          <3.9                             Smokers             <5.6 Roche [employer] Electrochemiluminescence Immunoassay (ECLIA) Values obtained with different assay methods or kits cannot be used interchangeably.  Results cannot be interpreted as absolute evidence of the presence or absence of malignant disease. Performed At: [facility] [place], [place] [id number] Rush Farmer MD QV:[id number]    No results found for: "PSA1" No results found for: "[phone]" No results found for: "CAN125"  No results found for: "TOTALPROTELP", "ALBUMINELP", "A1GS", "A2GS", "BETS", "BETA2SER", "GAMS", "MSPIKE", "SPEI" No results found for: "TIBC", "FERRITIN", "IRONPCTSAT" No results found for: "LDH"  STUDIES:  No results found.  EXAM:09/18/2021 NUCLEAR MEDICINE PET SKULL BASE TO THIGH CLINICAL DATA:  Initial treatment strategy for gastric cancer.   EXAM: NUCLEAR MEDICINE PET SKULL BASE TO THIGH   TECHNIQUE: 9.2 mCi F-18 FDG was injected intravenously. Full-ring PET imaging was performed from the skull base to thigh after the radiotracer. CT data was obtained and used for attenuation correction and anatomic localization.   Fasting blood glucose: 124 mg/dl   COMPARISON:  MRI July 20, 2021 and CT April 10, 2016   FINDINGS: Mediastinal blood pool activity: SUV max 2.4   Liver activity: SUV max NA   NECK: No hypermetabolic cervical adenopathy.   Symmetric hypermetabolic hyperplasia of the tonsils commonly reactive.   Incidental CT findings: none   CHEST: No hypermetabolic thoracic adenopathy.   No hypermetabolic pulmonary nodules or masses.   Incidental CT findings: Aortic atherosclerosis. Calcified mediastinal and right hilar lymph nodes. Motion degraded examination reveals no suspicious pulmonary nodules or masses. Patulous esophagus with a small hiatal hernia.    ABDOMEN/PELVIS: Evaluation of the gastric wall is limited by minimal distension, within this context: Similar diffuse nonspecific gastric wall thickening with no significant change in the appearance of the possible gastric body ulceration seen on image 117/4 and diffuse hypermetabolic activity within the stomach demonstrating a max SUV of 8.6.   Mildly metabolic nodularity anterior and inferior to the greater curvature of the stomach with the largest of which measures 22 x 13 mm on image 119/4 with a max SUV of 2.3.   No abnormal hypermetabolic activity within the liver, pancreas, adrenal glands or spleen.   No hypermetabolic abdominopelvic adenopathy.   Incidental CT findings: Similar mild intrahepatic and moderate extrahepatic biliary ductal dilation with the common duct measuring 13 mm. Colonic diverticulosis without findings of acute diverticulitis. Aortic atherosclerosis.   SKELETON: No focal hypermetabolic activity to suggest skeletal metastasis.   Incidental CT findings: Multilevel degenerative changes spine   IMPRESSION: 1. Evaluation of the gastric wall is limited by minimal distension, within this context, here is diffuse hypermetabolic activity within the stomach with similar diffuse nonspecific gastric wall thickening and no significant change in the appearance of the possible anterior gastric body ulceration. 2. Mildly metabolic omental nodularity anterior and inferior to the stomach, likely reflects omental disease involvement. 3. No convincing evidence of hypermetabolic metastatic disease in the neck, chest, or pelvis. 4. Similar mild intrahepatic and moderate extrahepatic biliary ductal dilation with the common duct measuring 13 mm no suspicious hypermetabolic lesion identified within the duct and no discrete lesion identified on prior MRI dated July 20, 2021. 5.  Aortic Atherosclerosis (ICD10-I70.0).  I,Gabriella Ballesteros,acting as a scribe  for Derwood Kaplan, MD.,have documented all relevant documentation on the behalf of Derwood Kaplan, MD,as directed by  Derwood Kaplan, MD while in the presence of Derwood Kaplan, MD.

## 2021-12-18 ENCOUNTER — Inpatient Hospital Stay: Payer: Medicare Other | Attending: Hematology and Oncology | Admitting: Oncology

## 2021-12-18 ENCOUNTER — Encounter: Payer: Self-pay | Admitting: Oncology

## 2021-12-18 ENCOUNTER — Other Ambulatory Visit: Payer: Self-pay | Admitting: Oncology

## 2021-12-18 VITALS — BP 164/75 | HR 88 | Temp 97.6°F | Resp 18 | Ht 64.0 in | Wt 189.4 lb

## 2021-12-18 DIAGNOSIS — C168 Malignant neoplasm of overlapping sites of stomach: Secondary | ICD-10-CM | POA: Diagnosis not present

## 2021-12-18 DIAGNOSIS — Z5111 Encounter for antineoplastic chemotherapy: Secondary | ICD-10-CM | POA: Insufficient documentation

## 2021-12-18 DIAGNOSIS — C169 Malignant neoplasm of stomach, unspecified: Secondary | ICD-10-CM | POA: Insufficient documentation

## 2021-12-18 DIAGNOSIS — Z79899 Other long term (current) drug therapy: Secondary | ICD-10-CM | POA: Insufficient documentation

## 2021-12-18 MED FILL — Nivolumab IV Soln 100 MG/10ML: INTRAVENOUS | Qty: 24 | Status: AC

## 2021-12-18 MED FILL — Oxaliplatin IV Soln 100 MG/20ML: INTRAVENOUS | Qty: 27 | Status: AC

## 2021-12-18 MED FILL — Leucovorin Calcium For Inj 350 MG: INTRAMUSCULAR | Qty: 31.5 | Status: AC

## 2021-12-18 MED FILL — Dexamethasone Sodium Phosphate Inj 100 MG/10ML: INTRAMUSCULAR | Qty: 1 | Status: AC

## 2021-12-18 MED FILL — Oxaliplatin IV Soln 100 MG/20ML: INTRAVENOUS | Qty: 20 | Status: AC

## 2021-12-18 MED FILL — Fluorouracil IV Soln 2.5 GM/50ML (50 MG/ML): INTRAVENOUS | Qty: 13 | Status: AC

## 2021-12-18 MED FILL — Fluorouracil IV Soln 5 GM/100ML (50 MG/ML): INTRAVENOUS | Qty: 76 | Status: AC

## 2021-12-18 MED FILL — Fosaprepitant Dimeglumine For IV Infusion 150 MG (Base Eq): INTRAVENOUS | Qty: 5 | Status: AC

## 2021-12-19 ENCOUNTER — Other Ambulatory Visit: Payer: Self-pay

## 2021-12-21 ENCOUNTER — Inpatient Hospital Stay: Payer: Medicare Other

## 2021-12-21 VITALS — BP 180/72 | HR 84 | Temp 97.7°F | Resp 18 | Ht 64.0 in | Wt 191.0 lb

## 2021-12-21 DIAGNOSIS — C168 Malignant neoplasm of overlapping sites of stomach: Secondary | ICD-10-CM

## 2021-12-21 DIAGNOSIS — Z5111 Encounter for antineoplastic chemotherapy: Secondary | ICD-10-CM | POA: Diagnosis not present

## 2021-12-21 DIAGNOSIS — C169 Malignant neoplasm of stomach, unspecified: Secondary | ICD-10-CM | POA: Diagnosis present

## 2021-12-21 DIAGNOSIS — Z79899 Other long term (current) drug therapy: Secondary | ICD-10-CM | POA: Diagnosis not present

## 2021-12-21 MED ORDER — SODIUM CHLORIDE 0.9 % IV SOLN
240.0000 mg | Freq: Once | INTRAVENOUS | Status: AC
  Administered 2021-12-21: 240 mg via INTRAVENOUS
  Filled 2021-12-21: qty 24

## 2021-12-21 MED ORDER — SODIUM CHLORIDE 0.9 % IV SOLN
1920.0000 mg/m2 | INTRAVENOUS | Status: DC
  Administered 2021-12-21: 3800 mg via INTRAVENOUS
  Filled 2021-12-21: qty 76

## 2021-12-21 MED ORDER — OXALIPLATIN CHEMO INJECTION 100 MG/20ML
51.0000 mg/m2 | Freq: Once | INTRAVENOUS | Status: AC
  Administered 2021-12-21: 100 mg via INTRAVENOUS
  Filled 2021-12-21: qty 20

## 2021-12-21 MED ORDER — DEXTROSE 5 % IV SOLN: Freq: Once | INTRAVENOUS | Status: AC

## 2021-12-21 MED ORDER — LEUCOVORIN CALCIUM INJECTION 350 MG
320.0000 mg/m2 | Freq: Once | INTRAVENOUS | Status: AC
  Administered 2021-12-21: 630 mg via INTRAVENOUS
  Filled 2021-12-21: qty 31.5

## 2021-12-21 MED ORDER — PALONOSETRON HCL INJECTION 0.25 MG/5ML
0.2500 mg | Freq: Once | INTRAVENOUS | Status: AC
  Administered 2021-12-21: 0.25 mg via INTRAVENOUS
  Filled 2021-12-21: qty 5

## 2021-12-21 MED ORDER — FLUOROURACIL CHEMO INJECTION 2.5 GM/50ML
320.0000 mg/m2 | Freq: Once | INTRAVENOUS | Status: AC
  Administered 2021-12-21: 650 mg via INTRAVENOUS
  Filled 2021-12-21: qty 13

## 2021-12-21 MED ORDER — SODIUM CHLORIDE 0.9 % IV SOLN
150.0000 mg | Freq: Once | INTRAVENOUS | Status: AC
  Administered 2021-12-21: 150 mg via INTRAVENOUS
  Filled 2021-12-21: qty 150

## 2021-12-21 MED ORDER — SODIUM CHLORIDE 0.9 % IV SOLN
10.0000 mg | Freq: Once | INTRAVENOUS | Status: AC
  Administered 2021-12-21: 10 mg via INTRAVENOUS
  Filled 2021-12-21: qty 10

## 2021-12-21 NOTE — Patient Instructions (Signed)
[facility]  Discharge Instructions: Thank you for choosing [facility] to provide your oncology and hematology care.  If you have a lab appointment with the [facility], please go directly to the [facility] and check in at the registration area.   Wear comfortable clothing and clothing appropriate for easy access to any Portacath or PICC line.   We strive to give you quality time with your provider. You may need to reschedule your appointment if you arrive late (15 or more minutes).  Arriving late affects you and other patients whose appointments are after yours.  Also, if you miss three or more appointments without notifying the office, you may be dismissed from the clinic at the provider's discretion.      For prescription refill requests, have your pharmacy contact our office and allow 72 hours for refills to be completed.    Today you received the following chemotherapy and/or immunotherapy agents    To help prevent nausea and vomiting after your treatment, we encourage you to take your nausea medication as directed.  BELOW ARE SYMPTOMS THAT SHOULD BE REPORTED IMMEDIATELY: *FEVER GREATER THAN 100.4 F (38 C) OR HIGHER *CHILLS OR SWEATING *NAUSEA AND VOMITING THAT IS NOT CONTROLLED WITH YOUR NAUSEA MEDICATION *UNUSUAL SHORTNESS OF BREATH *UNUSUAL BRUISING OR BLEEDING *URINARY PROBLEMS (pain or burning when urinating, or frequent urination) *BOWEL PROBLEMS (unusual diarrhea, constipation, pain near the anus) TENDERNESS IN MOUTH AND THROAT WITH OR WITHOUT PRESENCE OF ULCERS (sore throat, sores in mouth, or a toothache) UNUSUAL RASH, SWELLING OR PAIN  UNUSUAL VAGINAL DISCHARGE OR ITCHING   Items with * indicate a potential emergency and should be followed up as soon as possible or go to the Emergency Department if any problems should occur.  Please show the CHEMOTHERAPY ALERT CARD or IMMUNOTHERAPY ALERT CARD at check-in to the Emergency  Department and triage nurse.  Should you have questions after your visit or need to cancel or reschedule your appointment, please contact [facility]  Dept: [phone]  and follow the prompts.  Office hours are 8:00 a.m. to 4:30 p.m. Monday - Friday. Please note that voicemails left after 4:00 p.m. may not be returned until the following business day.  We are closed weekends and major holidays. You have access to a nurse at all times for urgent questions. Please call the main number to the clinic Dept: [phone] and follow the prompts.  For any non-urgent questions, you may also contact your provider using MyChart. We now offer e-Visits for anyone 89 and older to request care online for non-urgent symptoms. For details visit [email or web address].   Also download the MyChart app! Go to the app store, search "MyChart", open the app, select [facility], and log in with your MyChart username and password.  Masks are optional in the cancer centers. If you would like for your care team to wear a mask while they are taking care of you, please let them know. You may have one support person who is at least 84 years old accompany you for your appointments. Nivolumab Injection What is this medication? NIVOLUMAB (nye VOL ue mab) treats some types of cancer. It works by helping your immune system slow or stop the spread of cancer cells. It is a monoclonal antibody. This medicine may be used for other purposes; ask your health care provider or pharmacist if you have questions. COMMON BRAND NAME(S): Opdivo What should I tell my care team before  I take this medication? They need to know if you have any of these conditions: Allogeneic stem cell transplant (uses someone else's stem cells) Autoimmune diseases, such as Crohn disease, ulcerative colitis, lupus History of chest radiation Nervous system problems, such as Guillain-Barre syndrome or myasthenia gravis Organ transplant An  unusual or allergic reaction to nivolumab, other medications, foods, dyes, or preservatives Pregnant or trying to get pregnant Breast-feeding How should I use this medication? This medication is infused into a vein. It is given in a hospital or clinic setting. A special MedGuide will be given to you before each treatment. Be sure to read this information carefully each time. Talk to your care team about the use of this medication in children. While it may be prescribed for children as young as 12 years for selected conditions, precautions do apply. Overdosage: If you think you have taken too much of this medicine contact a poison control center or emergency room at once. NOTE: This medicine is only for you. Do not share this medicine with others. What if I miss a dose? Keep appointments for follow-up doses. It is important not to miss your dose. Call your care team if you are unable to keep an appointment. What may interact with this medication? Interactions have not been studied. This list may not describe all possible interactions. Give your health care provider a list of all the medicines, herbs, non-prescription drugs, or dietary supplements you use. Also tell them if you smoke, drink alcohol, or use illegal drugs. Some items may interact with your medicine. What should I watch for while using this medication? Your condition will be monitored carefully while you are receiving this medication. You may need blood work while taking this medication. This medication may cause serious skin reactions. They can happen weeks to months after starting the medication. Contact your care team right away if you notice fevers or flu-like symptoms with a rash. The rash may be red or purple and then turn into blisters or peeling of the skin. You may also notice a red rash with swelling of the face, lips, or lymph nodes in your neck or under your arms. Tell your care team right away if you have any change in your  eyesight. Talk to your care team if you are pregnant or think you might be pregnant. A negative pregnancy test is required before starting this medication. A reliable form of contraception is recommended while taking this medication and for 5 months after the last dose. Talk to your care team about effective forms of contraception. Do not breast-feed while taking this medication and for 5 months after the last dose. What side effects may I notice from receiving this medication? Side effects that you should report to your care team as soon as possible: Allergic reactions--skin rash, itching, hives, swelling of the face, lips, tongue, or throat Dry cough, shortness of breath or trouble breathing Eye pain, redness, irritation, or discharge with blurry or decreased vision Heart muscle inflammation--unusual weakness or fatigue, shortness of breath, chest pain, fast or irregular heartbeat, dizziness, swelling of the ankles, feet, or hands Hormone gland problems--headache, sensitivity to light, unusual weakness or fatigue, dizziness, fast or irregular heartbeat, increased sensitivity to cold or heat, excessive sweating, constipation, hair loss, increased thirst or amount of urine, tremors or shaking, irritability Infusion reactions--chest pain, shortness of breath or trouble breathing, feeling faint or lightheaded Kidney injury (glomerulonephritis)--decrease in the amount of urine, red or dark brown urine, foamy or bubbly urine, swelling  of the ankles, hands, or feet Liver injury--right upper belly pain, loss of appetite, nausea, light-colored stool, dark yellow or brown urine, yellowing skin or eyes, unusual weakness or fatigue Pain, tingling, or numbness in the hands or feet, muscle weakness, change in vision, confusion or trouble speaking, loss of balance or coordination, trouble walking, seizures Rash, fever, and swollen lymph nodes Redness, blistering, peeling, or loosening of the skin, including inside  the mouth Sudden or severe stomach pain, bloody diarrhea, fever, nausea, vomiting Side effects that usually do not require medical attention (report these to your care team if they continue or are bothersome): Bone, joint, or muscle pain Diarrhea Fatigue Loss of appetite Nausea Skin rash This list may not describe all possible side effects. Call your doctor for medical advice about side effects. You may report side effects to FDA at 1-800-FDA-1088. Where should I keep my medication? This medication is given in a hospital or clinic. It will not be stored at home. NOTE: This sheet is a summary. It may not cover all possible information. If you have questions about this medicine, talk to your doctor, pharmacist, or health care provider.  2023 Elsevier/Gold Standard (2021-06-01 00:00:00) Nivolumab Injection What is this medication? NIVOLUMAB (nye VOL ue mab) treats some types of cancer. It works by helping your immune system slow or stop the spread of cancer cells. It is a monoclonal antibody. This medicine may be used for other purposes; ask your health care provider or pharmacist if you have questions. COMMON BRAND NAME(S): Opdivo What should I tell my care team before I take this medication? They need to know if you have any of these conditions: Allogeneic stem cell transplant (uses someone else's stem cells) Autoimmune diseases, such as Crohn disease, ulcerative colitis, lupus History of chest radiation Nervous system problems, such as Guillain-Barre syndrome or myasthenia gravis Organ transplant An unusual or allergic reaction to nivolumab, other medications, foods, dyes, or preservatives Pregnant or trying to get pregnant Breast-feeding How should I use this medication? This medication is infused into a vein. It is given in a hospital or clinic setting. A special MedGuide will be given to you before each treatment. Be sure to read this information carefully each time. Talk to your  care team about the use of this medication in children. While it may be prescribed for children as young as 12 years for selected conditions, precautions do apply. Overdosage: If you think you have taken too much of this medicine contact a poison control center or emergency room at once. NOTE: This medicine is only for you. Do not share this medicine with others. What if I miss a dose? Keep appointments for follow-up doses. It is important not to miss your dose. Call your care team if you are unable to keep an appointment. What may interact with this medication? Interactions have not been studied. This list may not describe all possible interactions. Give your health care provider a list of all the medicines, herbs, non-prescription drugs, or dietary supplements you use. Also tell them if you smoke, drink alcohol, or use illegal drugs. Some items may interact with your medicine. What should I watch for while using this medication? Your condition will be monitored carefully while you are receiving this medication. You may need blood work while taking this medication. This medication may cause serious skin reactions. They can happen weeks to months after starting the medication. Contact your care team right away if you notice fevers or flu-like symptoms with a rash.  The rash may be red or purple and then turn into blisters or peeling of the skin. You may also notice a red rash with swelling of the face, lips, or lymph nodes in your neck or under your arms. Tell your care team right away if you have any change in your eyesight. Talk to your care team if you are pregnant or think you might be pregnant. A negative pregnancy test is required before starting this medication. A reliable form of contraception is recommended while taking this medication and for 5 months after the last dose. Talk to your care team about effective forms of contraception. Do not breast-feed while taking this medication and for 5  months after the last dose. What side effects may I notice from receiving this medication? Side effects that you should report to your care team as soon as possible: Allergic reactions--skin rash, itching, hives, swelling of the face, lips, tongue, or throat Dry cough, shortness of breath or trouble breathing Eye pain, redness, irritation, or discharge with blurry or decreased vision Heart muscle inflammation--unusual weakness or fatigue, shortness of breath, chest pain, fast or irregular heartbeat, dizziness, swelling of the ankles, feet, or hands Hormone gland problems--headache, sensitivity to light, unusual weakness or fatigue, dizziness, fast or irregular heartbeat, increased sensitivity to cold or heat, excessive sweating, constipation, hair loss, increased thirst or amount of urine, tremors or shaking, irritability Infusion reactions--chest pain, shortness of breath or trouble breathing, feeling faint or lightheaded Kidney injury (glomerulonephritis)--decrease in the amount of urine, red or dark brown urine, foamy or bubbly urine, swelling of the ankles, hands, or feet Liver injury--right upper belly pain, loss of appetite, nausea, light-colored stool, dark yellow or brown urine, yellowing skin or eyes, unusual weakness or fatigue Pain, tingling, or numbness in the hands or feet, muscle weakness, change in vision, confusion or trouble speaking, loss of balance or coordination, trouble walking, seizures Rash, fever, and swollen lymph nodes Redness, blistering, peeling, or loosening of the skin, including inside the mouth Sudden or severe stomach pain, bloody diarrhea, fever, nausea, vomiting Side effects that usually do not require medical attention (report these to your care team if they continue or are bothersome): Bone, joint, or muscle pain Diarrhea Fatigue Loss of appetite Nausea Skin rash This list may not describe all possible side effects. Call your doctor for medical advice about  side effects. You may report side effects to FDA at 1-800-FDA-1088. Where should I keep my medication? This medication is given in a hospital or clinic. It will not be stored at home. NOTE: This sheet is a summary. It may not cover all possible information. If you have questions about this medicine, talk to your doctor, pharmacist, or health care provider.  2023 Elsevier/Gold Standard (2021-06-01 00:00:00) Fluorouracil Injection What is this medication? FLUOROURACIL (flure oh YOOR a sil) treats some types of cancer. It works by slowing down the growth of cancer cells. This medicine may be used for other purposes; ask your health care provider or pharmacist if you have questions. COMMON BRAND NAME(S): Adrucil What should I tell my care team before I take this medication? They need to know if you have any of these conditions: Blood disorders Dihydropyrimidine dehydrogenase (DPD) deficiency Infection, such as chickenpox, cold sores, herpes Kidney disease Liver disease Poor nutrition Recent or ongoing radiation therapy An unusual or allergic reaction to fluorouracil, other medications, foods, dyes, or preservatives If you or your partner are pregnant or trying to get pregnant Breast-feeding How should I use  this medication? This medication is injected into a vein. It is administered by your care team in a hospital or clinic setting. Talk to your care team about the use of this medication in children. Special care may be needed. Overdosage: If you think you have taken too much of this medicine contact a poison control center or emergency room at once. NOTE: This medicine is only for you. Do not share this medicine with others. What if I miss a dose? Keep appointments for follow-up doses. It is important not to miss your dose. Call your care team if you are unable to keep an appointment. What may interact with this medication? Do not take this medication with any of the following: Live  virus vaccines This medication may also interact with the following: Medications that treat or prevent blood clots, such as warfarin, enoxaparin, dalteparin This list may not describe all possible interactions. Give your health care provider a list of all the medicines, herbs, non-prescription drugs, or dietary supplements you use. Also tell them if you smoke, drink alcohol, or use illegal drugs. Some items may interact with your medicine. What should I watch for while using this medication? Your condition will be monitored carefully while you are receiving this medication. This medication may make you feel generally unwell. This is not uncommon as chemotherapy can affect healthy cells as well as cancer cells. Report any side effects. Continue your course of treatment even though you feel ill unless your care team tells you to stop. In some cases, you may be given additional medications to help with side effects. Follow all directions for their use. This medication may increase your risk of getting an infection. Call your care team for advice if you get a fever, chills, sore throat, or other symptoms of a cold or flu. Do not treat yourself. Try to avoid being around people who are sick. This medication may increase your risk to bruise or bleed. Call your care team if you notice any unusual bleeding. Be careful brushing or flossing your teeth or using a toothpick because you may get an infection or bleed more easily. If you have any dental work done, tell your dentist you are receiving this medication. Avoid taking medications that contain aspirin, acetaminophen, ibuprofen, naproxen, or ketoprofen unless instructed by your care team. These medications may hide a fever. Do not treat diarrhea with over the counter products. Contact your care team if you have diarrhea that lasts more than 2 days or if it is severe and watery. This medication can make you more sensitive to the sun. Keep out of the sun. If  you cannot avoid being in the sun, wear protective clothing and sunscreen. Do not use sun lamps, tanning beds, or tanning booths. Talk to your care team if you or your partner wish to become pregnant or think you might be pregnant. This medication can cause serious birth defects if taken during pregnancy and for 3 months after the last dose. A reliable form of contraception is recommended while taking this medication and for 3 months after the last dose. Talk to your care team about effective forms of contraception. Do not father a child while taking this medication and for 3 months after the last dose. Use a condom while having sex during this time period. Do not breastfeed while taking this medication. This medication may cause infertility. Talk to your care team if you are concerned about your fertility. What side effects may I notice from receiving this  medication? Side effects that you should report to your care team as soon as possible: Allergic reactions--skin rash, itching, hives, swelling of the face, lips, tongue, or throat Heart attack--pain or tightness in the chest, shoulders, arms, or jaw, nausea, shortness of breath, cold or clammy skin, feeling faint or lightheaded Heart failure--shortness of breath, swelling of the ankles, feet, or hands, sudden weight gain, unusual weakness or fatigue Heart rhythm changes--fast or irregular heartbeat, dizziness, feeling faint or lightheaded, chest pain, trouble breathing High ammonia level--unusual weakness or fatigue, confusion, loss of appetite, nausea, vomiting, seizures Infection--fever, chills, cough, sore throat, wounds that don't heal, pain or trouble when passing urine, general feeling of discomfort or being unwell Low red blood cell level--unusual weakness or fatigue, dizziness, headache, trouble breathing Pain, tingling, or numbness in the hands or feet, muscle weakness, change in vision, confusion or trouble speaking, loss of balance or  coordination, trouble walking, seizures Redness, swelling, and blistering of the skin over hands and feet Severe or prolonged diarrhea Unusual bruising or bleeding Side effects that usually do not require medical attention (report to your care team if they continue or are bothersome): Dry skin Headache Increased tears Nausea Pain, redness, or swelling with sores inside the mouth or throat Sensitivity to light Vomiting This list may not describe all possible side effects. Call your doctor for medical advice about side effects. You may report side effects to FDA at 1-800-FDA-1088. Where should I keep my medication? This medication is given in a hospital or clinic. It will not be stored at home. NOTE: This sheet is a summary. It may not cover all possible information. If you have questions about this medicine, talk to your doctor, pharmacist, or health care provider.  2023 Elsevier/Gold Standard (2021-06-02 00:00:00)

## 2021-12-22 ENCOUNTER — Encounter: Payer: Self-pay | Admitting: Oncology

## 2021-12-23 ENCOUNTER — Encounter: Payer: Self-pay | Admitting: Genetic Counselor

## 2021-12-23 ENCOUNTER — Inpatient Hospital Stay: Payer: Medicare Other

## 2021-12-23 VITALS — BP 153/70 | HR 85 | Temp 98.1°F | Resp 18 | Ht 64.0 in | Wt 195.0 lb

## 2021-12-23 DIAGNOSIS — Z79899 Other long term (current) drug therapy: Secondary | ICD-10-CM | POA: Diagnosis not present

## 2021-12-23 DIAGNOSIS — Z1379 Encounter for other screening for genetic and chromosomal anomalies: Secondary | ICD-10-CM | POA: Insufficient documentation

## 2021-12-23 DIAGNOSIS — Z1501 Genetic susceptibility to malignant neoplasm of breast: Secondary | ICD-10-CM | POA: Insufficient documentation

## 2021-12-23 DIAGNOSIS — C169 Malignant neoplasm of stomach, unspecified: Secondary | ICD-10-CM | POA: Diagnosis not present

## 2021-12-23 DIAGNOSIS — C168 Malignant neoplasm of overlapping sites of stomach: Secondary | ICD-10-CM

## 2021-12-23 DIAGNOSIS — Z5111 Encounter for antineoplastic chemotherapy: Secondary | ICD-10-CM | POA: Diagnosis not present

## 2021-12-23 MED ORDER — SODIUM CHLORIDE 0.9% FLUSH
10.0000 mL | INTRAVENOUS | Status: DC | PRN
  Administered 2021-12-23: 10 mL

## 2021-12-23 MED ORDER — HEPARIN SOD (PORK) LOCK FLUSH 100 UNIT/ML IV SOLN
500.0000 [IU] | Freq: Once | INTRAVENOUS | Status: AC | PRN
  Administered 2021-12-23: 500 [IU]

## 2021-12-23 NOTE — Patient Instructions (Signed)
Fluorouracil Injection What is this medication? FLUOROURACIL (flure oh YOOR a sil) treats some types of cancer. It works by slowing down the growth of cancer cells. This medicine may be used for other purposes; ask your health care provider or pharmacist if you have questions. COMMON BRAND NAME(S): Adrucil What should I tell my care team before I take this medication? They need to know if you have any of these conditions: Blood disorders Dihydropyrimidine dehydrogenase (DPD) deficiency Infection, such as chickenpox, cold sores, herpes Kidney disease Liver disease Poor nutrition Recent or ongoing radiation therapy An unusual or allergic reaction to fluorouracil, other medications, foods, dyes, or preservatives If you or your partner are pregnant or trying to get pregnant Breast-feeding How should I use this medication? This medication is injected into a vein. It is administered by your care team in a hospital or clinic setting. Talk to your care team about the use of this medication in children. Special care may be needed. Overdosage: If you think you have taken too much of this medicine contact a poison control center or emergency room at once. NOTE: This medicine is only for you. Do not share this medicine with others. What if I miss a dose? Keep appointments for follow-up doses. It is important not to miss your dose. Call your care team if you are unable to keep an appointment. What may interact with this medication? Do not take this medication with any of the following: Live virus vaccines This medication may also interact with the following: Medications that treat or prevent blood clots, such as warfarin, enoxaparin, dalteparin This list may not describe all possible interactions. Give your health care provider a list of all the medicines, herbs, non-prescription drugs, or dietary supplements you use. Also tell them if you smoke, drink alcohol, or use illegal drugs. Some items may  interact with your medicine. What should I watch for while using this medication? Your condition will be monitored carefully while you are receiving this medication. This medication may make you feel generally unwell. This is not uncommon as chemotherapy can affect healthy cells as well as cancer cells. Report any side effects. Continue your course of treatment even though you feel ill unless your care team tells you to stop. In some cases, you may be given additional medications to help with side effects. Follow all directions for their use. This medication may increase your risk of getting an infection. Call your care team for advice if you get a fever, chills, sore throat, or other symptoms of a cold or flu. Do not treat yourself. Try to avoid being around people who are sick. This medication may increase your risk to bruise or bleed. Call your care team if you notice any unusual bleeding. Be careful brushing or flossing your teeth or using a toothpick because you may get an infection or bleed more easily. If you have any dental work done, tell your dentist you are receiving this medication. Avoid taking medications that contain aspirin, acetaminophen, ibuprofen, naproxen, or ketoprofen unless instructed by your care team. These medications may hide a fever. Do not treat diarrhea with over the counter products. Contact your care team if you have diarrhea that lasts more than 2 days or if it is severe and watery. This medication can make you more sensitive to the sun. Keep out of the sun. If you cannot avoid being in the sun, wear protective clothing and sunscreen. Do not use sun lamps, tanning beds, or tanning booths. Talk to   your care team if you or your partner wish to become pregnant or think you might be pregnant. This medication can cause serious birth defects if taken during pregnancy and for 3 months after the last dose. A reliable form of contraception is recommended while taking this  medication and for 3 months after the last dose. Talk to your care team about effective forms of contraception. Do not father a child while taking this medication and for 3 months after the last dose. Use a condom while having sex during this time period. Do not breastfeed while taking this medication. This medication may cause infertility. Talk to your care team if you are concerned about your fertility. What side effects may I notice from receiving this medication? Side effects that you should report to your care team as soon as possible: Allergic reactions--skin rash, itching, hives, swelling of the face, lips, tongue, or throat Heart attack--pain or tightness in the chest, shoulders, arms, or jaw, nausea, shortness of breath, cold or clammy skin, feeling faint or lightheaded Heart failure--shortness of breath, swelling of the ankles, feet, or hands, sudden weight gain, unusual weakness or fatigue Heart rhythm changes--fast or irregular heartbeat, dizziness, feeling faint or lightheaded, chest pain, trouble breathing High ammonia level--unusual weakness or fatigue, confusion, loss of appetite, nausea, vomiting, seizures Infection--fever, chills, cough, sore throat, wounds that don't heal, pain or trouble when passing urine, general feeling of discomfort or being unwell Low red blood cell level--unusual weakness or fatigue, dizziness, headache, trouble breathing Pain, tingling, or numbness in the hands or feet, muscle weakness, change in vision, confusion or trouble speaking, loss of balance or coordination, trouble walking, seizures Redness, swelling, and blistering of the skin over hands and feet Severe or prolonged diarrhea Unusual bruising or bleeding Side effects that usually do not require medical attention (report to your care team if they continue or are bothersome): Dry skin Headache Increased tears Nausea Pain, redness, or swelling with sores inside the mouth or throat Sensitivity  to light Vomiting This list may not describe all possible side effects. Call your doctor for medical advice about side effects. You may report side effects to FDA at 1-800-FDA-1088. Where should I keep my medication? This medication is given in a hospital or clinic. It will not be stored at home. NOTE: This sheet is a summary. It may not cover all possible information. If you have questions about this medicine, talk to your doctor, pharmacist, or health care provider.  2023 [employer] (2021-06-02 00:00:00)  

## 2021-12-23 NOTE — Progress Notes (Addendum)
GENETIC TEST RESULTS  Patient Name: Mackenzie Lewis Patient Age: 86 y.o. Encounter Date: 12/16/2021  Referring Provider: Hosie Poisson, MD   [name] was seen in the [facility] clinic on November 20, 2021 due to a personal and family history of cancer and concern regarding a hereditary predisposition to cancer in the family. Please refer to the prior Genetics clinic note for more information regarding [name]'s medical and family histories and our assessment at the time.     FAMILY HISTORY:  We obtained a detailed, 4-generation family history.  Significant diagnoses are listed below:      Family History  Problem Relation Age of Onset   Prostate cancer Father          metastatic; d. 3   Brain cancer Sister 68   Breast cancer Sister 70   Leukemia Cousin          x2 maternal female cousins; d. before 46   Breast cancer Daughter 61        DCIS         [name] is unaware of previous family history of genetic testing for hereditary cancer risks. Patient's maternal ancestors are of [place], and paternal ancestors are of [place]. There is reported Ashkenazi Jewish ancestry in her maternal family. There is no known consanguinity.   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   No other pathogenic variants detected in [facility] Multi-Cancer +RNA Panel.  The Multi-Cancer + RNA Panel offered by [facility] includes sequencing and/or deletion/duplication analysis of the following 84 genes:  AIP*, ALK, APC*, ATM*, AXIN2*, BAP1*, BARD1*, BLM*, BMPR1A*, BRCA1*, BRCA2*, BRIP1*, CASR, CDC73*, CDH1*, CDK4, CDKN1B*, CDKN1C*, CDKN2A, CEBPA, CHEK2*, CTNNA1*, DICER1*, DIS3L2*, EGFR, EPCAM, FH*, FLCN*, GATA2*, GPC3, GREM1, HOXB13, HRAS, KIT, MAX*, MEN1*, MET, MITF, MLH1*, MSH2*, MSH3*, MSH6*, MUTYH*, NBN*, NF1*, NF2*, NTHL1*, PALB2*, PDGFRA, PHOX2B, PMS2*, POLD1*, POLE*, POT1*, PRKAR1A*, PTCH1*, PTEN*,  RAD50*, RAD51C*, RAD51D*, RB1*, RECQL4, RET, RUNX1*, SDHA*, SDHAF2*, SDHB*, SDHC*, SDHD*, SMAD4*, SMARCA4*, SMARCB1*, SMARCE1*, STK11*, SUFU*, TERC, TERT, TMEM127*, Tp53*, TSC1*, TSC2*, VHL*, WRN*, and WT1.  RNA analysis is performed for * genes.  Report date is 12/09/2021.        CHEK2 CANCER RISKS & RECOMMENDATIONS:  We discussed that CHEK2 mutations have been found to be associated with an increased risk of breast, colon, prostate, and possibly other cancers. It is important to distinguish that [name]'s specific mutation (c.470T>C) has been shown to confer a risk for breast cancer that is less than the breast cancer risk for other mutations in the CHEK2 gene. Thus, her mutation is classified as a low penetrance mutation. Several studies of this low penetrance mutation (c.470T>C) estimated the lifetime risk for breast cancer among carriers to be increased by about 50% over the general population risk.  For those of average breast cancer risk, this low penetrance mutation in isolation is not expected to increase one's breast cancer risk >20%. The risk for contralateral breast cancer has not been well studied in women with the c.470T>C mutation.   Both men and women with a CHEK2 mutation may have an increased risk of colon cancer, and men have a possible increased prostate cancer risk. Precise lifetime risk estimates are not well established. There is preliminary evidence supporting a correlation with CHEK2 and autosomal dominant predisposition to other cancer types; however, the available evidence is insufficient to make a determination regarding these relationships.   Cancer risks  in families with the same CHEK2 mutation can vary widely, suggesting that there are likely to be other factors involved that we don't understand yet that also contribute to the cancer risks associated with CHEK2 mutations. An individual's cancer risk and medical management are not determined by genetic test results alone.  Overall cancer risk assessment incorporates additional factors, including personal medical history, family history, and any available genetic information that may result in a personalized plan for cancer prevention and surveillance.   According to the [place] [phone]) and [employer] (2023; PMID: [id number]).  Breast Cancer Management:   Low penetrance variants in CHEK2 are not clinically actionable in isolation for breast cancer risk.  Breast surveillance should be based on personalized assessment including family history as a minimum   Colon Cancer Management:  Personal history of colon cancer: Manage based on personal history. Do not have a personal history of colon cancer but have a parent/sibling/child with colon cancer:  Colonoscopy every 5 years starting at age 41 or 16 years younger than the earliest age of onset, whichever is younger. Do not have a personal history of colon cancer and do not have a parent/sibling/child with colon cancer:  Colonoscopy every 5 years starting at age 96.  Per [employer], colon cancer screening should be based on family history and not a CHEK2 c.470T>C mutation in isolation.  Thus, it is reasonable for some individuals with CHEK2 low penetrance variants to opt for less aggressive screening based on shared decision making with their gastroenterologist.   Prostate Cancer Management:  There is emerging evidence associated with pathogenic variants in CHEK2 and prostate cancer. Specific prostate cancer risk associated with c.470T>C variants is unclear at this time.  Consider prostate cancer screening beginning at age 59  Implications for Family Members: Hereditary predisposition to cancer due to pathogenic variants in the CHEK2 gene has autosomal dominant inheritance. This means that an individual with a pathogenic variant has a 50% chance of passing the condition on to his/her offspring. Identification of a pathogenic variant allows for the recognition  of at-risk relatives who can pursue testing for the familial variant.   Family members are encouraged to consider genetic testing for this familial pathogenic variant. As there are generally no childhood cancer risks associated with pathogenic variants in the CHEK2 gene, individuals in the family are not recommended to have testing until they reach at least 84 years of age. Complimentary testing for the familial variant is available for 90 days. They may contact our office at [phone] for more information or to schedule an appointment. Family members who live outside of the area are encouraged to find a genetic counselor in their area by visiting: [email or web address].   Resources: [employer] (Facing Our Risk of Cancer Empowered) is a resource for those with a hereditary predisposition to develop cancer.  [employer] provides information about risk reduction, advocacy, legislation, and clinical trials.  Additionally, [employer] provides a platform for collaboration and support; which includes: peer navigation, message boards, local support groups, a toll-free helpline, research registry and recruitment, advocate training, published medical research, webinars, brochures, mastectomy photos, and more.  For more information, visit www.facingourrisk.org  Plan:  Changes in cancer screening based on this result are not indicated for Ms. Pelzel at this time.  Family letter was provided to encourage family testing for this variant.   Our contact number was provided. Ms. Staton questions were answered to her satisfaction, and she knows she is welcome to call us at anytime with additional questions  or concerns.   Dayn Barich M. Joette Catching, [place], [facility] Genetic Counselor [email or web address] (P) [phone]

## 2021-12-29 ENCOUNTER — Telehealth: Payer: Self-pay

## 2021-12-29 ENCOUNTER — Other Ambulatory Visit: Payer: Self-pay

## 2021-12-29 DIAGNOSIS — C168 Malignant neoplasm of overlapping sites of stomach: Secondary | ICD-10-CM

## 2021-12-29 DIAGNOSIS — Z1501 Genetic susceptibility to malignant neoplasm of breast: Secondary | ICD-10-CM

## 2021-12-29 NOTE — Telephone Encounter (Signed)
Referral sent electronically

## 2021-12-29 NOTE — Telephone Encounter (Signed)
-----   Message from Derwood Kaplan, MD sent at 12/28/2021  7:56 PM EST ----- Regarding: referral I think she would benefit from seeing Teresita and I know I mentioned it to pt. I can't recall if she agreed or whether we made referral.

## 2021-12-29 NOTE — Telephone Encounter (Signed)
Referral sent electronically.

## 2021-12-29 NOTE — Telephone Encounter (Signed)
-----   Message from Mort Sawyers sent at 12/29/2021  8:05 AM EST ----- Regarding: RE: referral Levada Dy I cannot make the referral, can you do that? ----- Message ----- From: Derwood Kaplan, MD Sent: 12/28/2021   7:57 PM EST To: Mort Sawyers; Belva Chimes, LPN Subject: referral                                       I think she would benefit from seeing Teresita and I know I mentioned it to pt. I can't recall if she agreed or whether we made referral.

## 2021-12-29 NOTE — Progress Notes (Signed)
[facility]  [address] [place],  [place]  [address] [phone]  Clinic Day: 12/31/2021  Referring physician: Ernestene Kiel, MD   ASSESSMENT & PLAN:   Gastric adenocarcinoma This is a poorly differentiated adenocarcinoma with signet ring features and ulceration.  I would consider it as metastatic to the omentum and so that would stage this as a T4 N0 M1, stage IVB.  PET scan did not show evidence of metastatic disease other than the omentum. She is on treatment with chemotherapy of FOLFOX and immunotherapy with nivolumab to improve the success rate. Stain for HER2 was negative at 0. She is tolerating treatment well other than mild nausea and mouth sores.    She is tolerating treatment well other than mild nausea and mouth sores. She will proceed with her FOLFOX + Nivolumab treatment next week. We will then see her back in 2 weeks with CBC,CMP, TSH and T4 for the next cycle. I reviewed all of this with her and her family and answered their questions. She understands and agrees with this plan.  I provided 20 minutes of face-to-face time during this this encounter and > 50% was spent counseling as documented under my assessment and plan.    Derwood Kaplan, MD [facility] [address] [place] [address] Dept: [phone] Dept Fax: [phone]   CHIEF COMPLAINT:  CC: Gastric cancer  Current Treatment: Chemotherapy/immunotherapy   HISTORY OF PRESENT ILLNESS:  Mackenzie Lewis is a 86 y.o. female with a history of gastric cancer who is referred in consultation with Dr. Sandria Senter for assessment and management.  She had noticed that she was having regurgitation when eating and had lost over 30 pounds.  An ultrasound was done, revealing hepatic steatosis, and led to an MRI scan on June 5 which revealed gastric wall thickening with confluent nodularity of the omentum  anterior to the stomach measuring 3.5 cm consistent with metastatic tumor.  She also had low-grade edema and wall thickening extending into the duodenum from the stomach.  She was referred to Dr. Sandria Senter and he did an EGD on July 7.  This revealed a large ulceration measuring 1.2 cm along the greater curvature.  She also had diffusely edematous and erythematous wall with erosions of the antrum and stiff and friable mucosa with oozing of blood.  These findings extended to the gastric fundus as well.  Pathology revealed a poorly differentiated adenocarcinoma with signet ring features from the biopsies of the ulcer as well as the antrum and the fundus of the stomach.  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  She was placed on omeprazole.  Her test for H. pylori was negative.  She was referred to Dr. Kendell Bane for consideration of surgery but he felt this was not resectable because of the extensive involvement and I agree.  I would consider this extending to the duodenum and also metastatic to the omentum.  PET scan confirmed these findings and she wished to pursue systemic intravenous therapy.  She continues to eat and has adjusted her diet to softer foods and liquids and is drinking boost so she has maintained her weight.  She does have some Zofran ODT for nausea when needed.  She has had some anorexia, nausea, occasional vomiting, early satiety, dysphagia, and belching.  A CEA and CA 19-9 were normal. She has been started on FOLFOX chemotherapy along with  immunotherapy.  INTERVAL HISTORY:  I have reviewed her chart and materials related to her cancer extensively and collaborated history with the patient. Summary of oncologic history is as follows: Oncology History  Gastric cancer ([facility])  09/10/2021 Initial Diagnosis   Gastric cancer ([facility])   09/10/2021 Cancer Staging   Staging form: Stomach, AJCC 8th Edition - Clinical stage from 09/10/2021: Stage  IVB (cT4b, cN0, cM1) - Signed by Derwood Kaplan, MD on 09/10/2021 Histopathologic type: Adenocarcinoma, NOS Stage prefix: Initial diagnosis Total positive nodes: 0 Histologic grade (G): G3 Histologic grading system: 3 grade system Sites of metastasis: Peritoneal surface Diagnostic confirmation: Positive histology PLUS positive immunophenotyping and/or positive genetic studies Specimen type: Endoscopy with Biopsy Staged by: Managing physician Carcinoembryonic antigen (CEA) (ng/mL): 2.8 Carbohydrate antigen 19-9 (CA 19-9) (U/mL): 4.9 HER2 status: Unknown Microsatellite instability (MSI): Unknown Tumor location in stomach: Other Clinical staging modalities: Biopsy, Endoscopy Stage used in treatment planning: Yes National guidelines used in treatment planning: Yes Type of national guideline used in treatment planning: NCCN   09/28/2021 - 10/14/2021 Chemotherapy   Patient is on Treatment Plan : GASTROESOPHAGEAL FOLFOX + Nivolumab q14d     09/28/2021 -  Chemotherapy   Patient is on Treatment Plan : GASTROESOPHAGEAL FOLFOX + Nivolumab q14d     12/09/2021 Genetic Testing   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  Report date is 12/09/2021.   The Multi-Cancer + RNA Panel offered by [facility] includes sequencing and/or deletion/duplication analysis of the following 84 genes:  AIP*, ALK, APC*, ATM*, AXIN2*, BAP1*, BARD1*, BLM*, BMPR1A*, BRCA1*, BRCA2*, BRIP1*, CASR, CDC73*, CDH1*, CDK4, CDKN1B*, CDKN1C*, CDKN2A, CEBPA, CHEK2*, CTNNA1*, DICER1*, DIS3L2*, EGFR, EPCAM, FH*, FLCN*, GATA2*, GPC3, GREM1, HOXB13, HRAS, KIT, MAX*, MEN1*, MET, MITF, MLH1*, MSH2*, MSH3*, MSH6*, MUTYH*, NBN*, NF1*, NF2*, NTHL1*, PALB2*, PDGFRA, PHOX2B, PMS2*, POLD1*, POLE*, POT1*, PRKAR1A*, PTCH1*, PTEN*, RAD50*, RAD51C*, RAD51D*, RB1*, RECQL4, RET, RUNX1*, SDHA*, SDHAF2*, SDHB*, SDHC*, SDHD*, SMAD4*, SMARCA4*, SMARCB1*, SMARCE1*, STK11*, SUFU*, TERC, TERT, TMEM127*, Tp53*, TSC1*,  TSC2*, VHL*, WRN*, and WT1.  RNA analysis is performed for * genes.    INTERVAL HISTORY Bryttani is seen in the clinic for follow up of her gastric cancer.  She is on FOLFOX and nivolumab every 2 weeks and is due for her eighth dose.  Overall she is tolerating fairly well but we have had to do dose reductions.  She still complains of tingling of her feet.  She needs an updated prescription for omeprazole and we will send that to random drug.  She denies fever, chills, night sweats, or other signs of infection. She denies cardiorespiratory and gastrointestinal issues. She  denies pain. Her appetite is good.  She has gained 2 pounds since her last visit.     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  If that happens, she is to call us so that we can schedule her for an ultrasound to look for synovitis.     Essential hypertension 09/09/2016   Gastric  cancer ([facility]) 09/10/2021   Hyperlipidemia 09/01/2016   Hypertension    Hypothyroidism 09/01/2016   Osteoarthritis of both feet 01/20/2016   Osteoarthritis, hand 01/20/2016   Thyroid disease   Degenerative disc disease History of endometriosis  Past Surgical History:  Procedure Laterality Date   APPENDECTOMY     LAPAROSCOPIC APPENDECTOMY N/A 04/10/2016   Procedure: APPENDECTOMY LAPAROSCOPIC;  Surgeon: Coralie Keens, MD;  Location: [facility];  Service: General;  Laterality: N/A;  Bilateral tubal ligation Total  hysterectomy and bilateral salpingo-oophorectomy in 1980  Family History  Problem Relation Age of Onset   Hypertension Mother    Prostate cancer Father        metastatic; d. 28   Brain cancer Sister 79   Breast cancer Sister 43   AAA (abdominal aortic aneurysm) Brother    Leukemia Cousin        x2 maternal female cousins; d. before 74   Breast cancer Daughter 67       DCIS  Her sister has had breast cancer as well as a tumor of her head Her daughter has had breast cancer last year  Social History:  reports that she has never smoked. She has never used smokeless tobacco. She reports that she does not currently use alcohol. She reports that she does not use drugs.The patient is accompanied by her son, daughter-in-law and daughter today.  She is single but lives with a significant other.  She has the 2 children.  She worked in [employer] (medical) and denies any chemical or toxin exposures.  She grew up in [place] and [place].  She is active and healthy, especially for her age.  Allergies:  Allergies  Allergen Reactions   Sulfa Antibiotics Rash    Other reaction(s): Other (See Comments) "Made me feel weird"    Current Medications: Current Outpatient Medications  Medication Sig Dispense Refill   amoxicillin-clavulanate (AUGMENTIN) 875-125 MG tablet Take 1 tablet by mouth 2 (two) times daily for 10 days. 20 tablet 0   aspirin 81 MG EC tablet Take 81 mg by mouth daily. Swallow whole.     Calcium Carbonate (CALCIUM 600 PO) Take 1 tablet by mouth daily.     cetirizine (ZYRTEC) 10 MG tablet Take 10 mg by mouth daily.     Cholecalciferol (VITAMIN D3) 5000 units CAPS Take 1 capsule by mouth daily.     doxycycline (VIBRAMYCIN) 100 MG capsule Take 100 mg by mouth 2 (two) times daily.     EXFORGE HCT 5-160-12.5 MG TABS Take 1 tablet by mouth daily.     famotidine (PEPCID) 40 MG tablet Take 40 mg by mouth at bedtime.     KRILL OIL PO Take 1 capsule by mouth daily. Unknown strenght     levothyroxine  (SYNTHROID, LEVOTHROID) 75 MCG tablet Take 75 mcg by mouth daily before breakfast.      LORazepam (ATIVAN) 0.5 MG tablet Take 1 tablet (0.5 mg total) by mouth at bedtime. 30 tablet 0   NON FORMULARY MMW: 3 parts Maalox 2 parts Benadryl 1 part viscious lidicaine  Disp. 6oz  Instructions: 47m swish and swallow every 3-4 hours     omeprazole (PRILOSEC) 40 MG capsule Take 1 capsule (40 mg total) by mouth 2 (two) times daily. 60 capsule 5   ondansetron (ZOFRAN) 4 MG tablet Take 4 mg by mouth every 4 (four) hours as needed.     ondansetron (ZOFRAN-ODT) 4 MG disintegrating tablet Take 1 tablet (4 mg total) by mouth every 8 (eight) hours as  needed for nausea or vomiting. 90 tablet 0   polyethylene glycol powder (GLYCOLAX/MIRALAX) 17 GM/SCOOP powder SMARTSIG:1 scoopful By Mouth Daily     potassium chloride SA (KLOR-CON M) 20 MEQ tablet Take 1 tablet (20 mEq total) by mouth daily. 30 tablet 5   pravastatin (PRAVACHOL) 20 MG tablet Take 1 tablet (20 mg total) by mouth every evening. 90 tablet 3   Probiotic Product (PROBIOTIC DAILY PO) Take 1 tablet by mouth daily.     prochlorperazine (COMPAZINE) 10 MG tablet Take 1 tablet (10 mg total) by mouth every 6 (six) hours as needed for nausea or vomiting. 90 tablet 3   No current facility-administered medications for this visit.   Facility-Administered Medications Ordered in Other Visits  Medication Dose Route Frequency Provider Last Rate Last Admin   fluorouracil (ADRUCIL) 3,800 mg in sodium chloride 0.9 % 74 mL chemo infusion  1,920 mg/m2 (Order-Specific) Intravenous 1 day or 1 dose Derwood Kaplan, MD   Infusion Verify at 01/18/22 1358   heparin lock flush 100 unit/mL  500 Units Intracatheter Once PRN Derwood Kaplan, MD       sodium chloride flush (NS) 0.9 % injection 10 mL  10 mL Intracatheter PRN Derwood Kaplan, MD        REVIEW OF SYSTEMS:  Review of Systems  Eyes: Negative.   Respiratory: Negative.    Cardiovascular: Negative.    Gastrointestinal:  Positive for constipation and nausea.       She does have some dysphagia and regurgitation with certain foods.  She has to eat very slowly.  Genitourinary: Negative.    Musculoskeletal: Negative.   Skin: Negative.   Neurological: Negative.   Hematological: Negative.   Psychiatric/Behavioral: Negative.        VITALS:  Blood pressure (!) 170/79, pulse 71, temperature 97.9 F (36.6 C), temperature source Oral, resp. rate 18, height _0  (1.626 m), weight 191 lb (86.6 kg), SpO2 96 %.  Wt Readings from Last 3 Encounters:  01/18/22 192 lb (87.1 kg)  01/04/22 190 lb 1.3 oz (86.2 kg)  12/31/21 191 lb (86.6 kg)    Body mass index is 32.79 kg/m.  Performance status (ECOG): 1 - Symptomatic but completely ambulatory  PHYSICAL EXAM:  Physical Exam Constitutional:      General: She is not in acute distress.    Appearance: Normal appearance. She is not ill-appearing or toxic-appearing.  HENT:     Head: Normocephalic and atraumatic.     Nose: Nose normal.     Mouth/Throat:     Pharynx: Oropharynx is clear.  Eyes:     General:        Right eye: No discharge.        Left eye: No discharge.     Extraocular Movements: Extraocular movements intact.     Conjunctiva/sclera: Conjunctivae normal.     Pupils: Pupils are equal, round, and reactive to light.  Cardiovascular:     Rate and Rhythm: Normal rate and regular rhythm.     Pulses: Normal pulses.     Heart sounds: Normal heart sounds. No murmur heard.    No gallop.  Pulmonary:     Effort: Pulmonary effort is normal. No respiratory distress.     Breath sounds: Normal breath sounds. No wheezing or rales.  Abdominal:     General: Bowel sounds are normal.     Palpations: Abdomen is soft. There is mass.     Comments: She has a long mass effect in the  epigastric area measuring at least 12 to 15 cm long and hard to palpation  Musculoskeletal:        General: Normal range of motion.     Cervical back: Normal range of  motion and neck supple.  Skin:    General: Skin is warm and dry.  Neurological:     General: No focal deficit present.     Mental Status: She is alert and oriented to person, place, and time.  Psychiatric:        Mood and Affect: Mood normal.        Behavior: Behavior normal.        Thought Content: Thought content normal.        Judgment: Judgment normal.     LABS:      Latest Ref Rng & Units 01/14/2022    9:38 AM 12/31/2021   12:00 AM 12/16/2021   12:00 AM  CBC  WBC 4.0 - 10.5 K/uL 5.3  5.6     5.5      Hemoglobin 12.0 - 15.0 g/dL 12.8  13.2     13.6      Hematocrit 36.0 - 46.0 % 37.7  39     39      Platelets 150 - 400 K/uL 147  137     143         This result is from an external source.      Latest Ref Rng & Units 01/14/2022    9:38 AM 12/31/2021    9:09 AM 12/16/2021   12:00 AM  CMP  Glucose 70 - 99 mg/dL 127  152    BUN 8 - 23 mg/dL _0 Creatinine 0.44 - 1.00 mg/dL 1.02  1.05  0.8      Sodium 135 - 145 mmol/L 141  141  138      Potassium 3.5 - 5.1 mmol/L 3.7  4.1  3.7      Chloride 98 - 111 mmol/L 107  109  105      CO2 22 - 32 mmol/L _1 Calcium 8.9 - 10.3 mg/dL 9.1  9.3  9.5      Total Protein 6.5 - 8.1 g/dL 6.6  6.6    Total Bilirubin 0.3 - 1.2 mg/dL 0.5  0.6    Alkaline Phos 38 - 126 U/L 69  70  85      AST 15 - 41 U/L _2 ALT 0 - 44 U/L _3 This result is from an external source.     Lab Results  Component Value Date   CEA1 2.9 09/10/2021   /  CEA  Date Value Ref Range Status  09/10/2021 2.9 0.0 - 4.7 ng/mL Final    Comment:    (NOTE)                             Nonsmokers          <3.9                             Smokers             <5.6 [employer] Electrochemiluminescence Immunoassay (ECLIA) Values obtained with different  assay methods or kits cannot be used interchangeably.  Results cannot be interpreted as absolute evidence of the presence or absence of malignant  disease. Performed At: [facility] [place], [place] [id number] Rush Farmer MD ZD:[id number]    No results found for: "PSA1" No results found for: "[id number]" No results found for: "CAN125"  No results found for: "TOTALPROTELP", "ALBUMINELP", "A1GS", "A2GS", "BETS", "BETA2SER", "GAMS", "MSPIKE", "SPEI" No results found for: "TIBC", "FERRITIN", "IRONPCTSAT" No results found for: "LDH"  STUDIES:  No results found.  EXAM:12/16/21 CT CHEST, ABDOMEN, AND PELVIS WITH CONTRAST IMPRESSION: Response to therapy of gastric primary and adjacent omental nodularity. No new or progressive disease. Right larger than left lower lobe pulmonary nodules, felt to be similar to the 04/10/2016 remote CT and therefore benign. Progressive interstitial lung disease, right greater than left. This could be postinfectious/inflammatory or less likely represent nonspecific interstitial pneumonitis. Improvement in common duct dilation, likely incidental given chronicity.   EXAM:09/18/2021 NUCLEAR MEDICINE PET SKULL BASE TO THIGH CLINICAL DATA:  Initial treatment strategy for gastric cancer.   EXAM: NUCLEAR MEDICINE PET SKULL BASE TO THIGH   TECHNIQUE: 9.2 mCi F-18 FDG was injected intravenously. Full-ring PET imaging was performed from the skull base to thigh after the radiotracer. CT data was obtained and used for attenuation correction and anatomic localization.   Fasting blood glucose: 124 mg/dl   COMPARISON:  MRI July 20, 2021 and CT April 10, 2016   FINDINGS: Mediastinal blood pool activity: SUV max 2.4   Liver activity: SUV max NA   NECK: No hypermetabolic cervical adenopathy.   Symmetric hypermetabolic hyperplasia of the tonsils commonly reactive.   Incidental CT findings: none   CHEST: No hypermetabolic thoracic adenopathy.   No hypermetabolic pulmonary nodules or masses.   Incidental CT findings: Aortic atherosclerosis. Calcified mediastinal and right hilar  lymph nodes. Motion degraded examination reveals no suspicious pulmonary nodules or masses. Patulous esophagus with a small hiatal hernia.   ABDOMEN/PELVIS: Evaluation of the gastric wall is limited by minimal distension, within this context: Similar diffuse nonspecific gastric wall thickening with no significant change in the appearance of the possible gastric body ulceration seen on image 117/4 and diffuse hypermetabolic activity within the stomach demonstrating a max SUV of 8.6.   Mildly metabolic nodularity anterior and inferior to the greater curvature of the stomach with the largest of which measures 22 x 13 mm on image 119/4 with a max SUV of 2.3.   No abnormal hypermetabolic activity within the liver, pancreas, adrenal glands or spleen.   No hypermetabolic abdominopelvic adenopathy.   Incidental CT findings: Similar mild intrahepatic and moderate extrahepatic biliary ductal dilation with the common duct measuring 13 mm. Colonic diverticulosis without findings of acute diverticulitis. Aortic atherosclerosis.   SKELETON: No focal hypermetabolic activity to suggest skeletal metastasis.   Incidental CT findings: Multilevel degenerative changes spine   IMPRESSION: 1. Evaluation of the gastric wall is limited by minimal distension, within this context, here is diffuse hypermetabolic activity within the stomach with similar diffuse nonspecific gastric wall thickening and no significant change in the appearance of the possible anterior gastric body ulceration. 2. Mildly metabolic omental nodularity anterior and inferior to the stomach, likely reflects omental disease involvement. 3. No convincing evidence of hypermetabolic metastatic disease in the neck, chest, or pelvis. 4. Similar mild intrahepatic and moderate extrahepatic biliary ductal dilation with the common duct measuring 13 mm no suspicious hypermetabolic lesion identified within the duct and no discrete lesion  identified on prior  MRI dated July 20, 2021. 5.  Aortic Atherosclerosis (ICD10-I70.0).        I,Gabriella Ballesteros,acting as a scribe for Derwood Kaplan, MD.,have documented all relevant documentation on the behalf of Derwood Kaplan, MD,as directed by  Derwood Kaplan, MD while in the presence of Derwood Kaplan, MD.

## 2021-12-31 ENCOUNTER — Other Ambulatory Visit: Payer: Self-pay

## 2021-12-31 ENCOUNTER — Encounter: Payer: Self-pay | Admitting: Oncology

## 2021-12-31 ENCOUNTER — Inpatient Hospital Stay: Payer: Medicare Other

## 2021-12-31 ENCOUNTER — Other Ambulatory Visit: Payer: Self-pay | Admitting: Oncology

## 2021-12-31 ENCOUNTER — Inpatient Hospital Stay (INDEPENDENT_AMBULATORY_CARE_PROVIDER_SITE_OTHER): Payer: Medicare Other | Admitting: Oncology

## 2021-12-31 VITALS — BP 170/79 | HR 71 | Temp 97.9°F | Resp 18 | Ht 64.0 in | Wt 191.0 lb

## 2021-12-31 DIAGNOSIS — C168 Malignant neoplasm of overlapping sites of stomach: Secondary | ICD-10-CM | POA: Diagnosis not present

## 2021-12-31 DIAGNOSIS — C169 Malignant neoplasm of stomach, unspecified: Secondary | ICD-10-CM | POA: Diagnosis not present

## 2021-12-31 DIAGNOSIS — Z79899 Other long term (current) drug therapy: Secondary | ICD-10-CM | POA: Diagnosis not present

## 2021-12-31 DIAGNOSIS — Z1501 Genetic susceptibility to malignant neoplasm of breast: Secondary | ICD-10-CM

## 2021-12-31 DIAGNOSIS — Z5111 Encounter for antineoplastic chemotherapy: Secondary | ICD-10-CM | POA: Diagnosis not present

## 2021-12-31 DIAGNOSIS — D649 Anemia, unspecified: Secondary | ICD-10-CM | POA: Diagnosis not present

## 2021-12-31 DIAGNOSIS — E876 Hypokalemia: Secondary | ICD-10-CM

## 2021-12-31 LAB — CBC AND DIFFERENTIAL
HCT: 39 (ref 36–46)
Hemoglobin: 13.2 (ref 12.0–16.0)
Neutrophils Absolute: 2.8
Platelets: 137 10*3/uL — AB (ref 150–400)
WBC: 5.6

## 2021-12-31 LAB — CMP (CANCER CENTER ONLY)
ALT: 19 U/L (ref 0–44)
AST: 25 U/L (ref 15–41)
Albumin: 4 g/dL (ref 3.5–5.0)
Alkaline Phosphatase: 70 U/L (ref 38–126)
Anion gap: 6 (ref 5–15)
BUN: 14 mg/dL (ref 8–23)
CO2: 26 mmol/L (ref 22–32)
Calcium: 9.3 mg/dL (ref 8.9–10.3)
Chloride: 109 mmol/L (ref 98–111)
Creatinine: 1.05 mg/dL — ABNORMAL HIGH (ref 0.44–1.00)
GFR, Estimated: 52 mL/min — ABNORMAL LOW (ref >60)
Glucose, Bld: 152 mg/dL — ABNORMAL HIGH (ref 70–99)
Potassium: 4.1 mmol/L (ref 3.5–5.1)
Sodium: 141 mmol/L (ref 135–145)
Total Bilirubin: 0.6 mg/dL (ref 0.3–1.2)
Total Protein: 6.6 g/dL (ref 6.5–8.1)

## 2021-12-31 LAB — CBC: RBC: 4.32 (ref 3.87–5.11)

## 2021-12-31 LAB — TSH: TSH: 1.987 u[IU]/mL (ref 0.350–4.500)

## 2021-12-31 MED ORDER — OMEPRAZOLE 40 MG PO CPDR: 40.0000 mg | DELAYED_RELEASE_CAPSULE | Freq: Two times a day (BID) | ORAL | 5 refills | Status: AC

## 2022-01-01 ENCOUNTER — Encounter: Payer: Self-pay | Admitting: Licensed Clinical Social Worker

## 2022-01-01 DIAGNOSIS — C160 Malignant neoplasm of cardia: Secondary | ICD-10-CM

## 2022-01-01 MED FILL — Fosaprepitant Dimeglumine For IV Infusion 150 MG (Base Eq): INTRAVENOUS | Qty: 5 | Status: AC

## 2022-01-01 MED FILL — Fluorouracil IV Soln 5 GM/100ML (50 MG/ML): INTRAVENOUS | Qty: 76 | Status: AC

## 2022-01-01 MED FILL — Dexamethasone Sodium Phosphate Inj 100 MG/10ML: INTRAMUSCULAR | Qty: 1 | Status: AC

## 2022-01-01 MED FILL — Nivolumab IV Soln 100 MG/10ML: INTRAVENOUS | Qty: 24 | Status: AC

## 2022-01-01 MED FILL — Oxaliplatin IV Soln 100 MG/20ML: INTRAVENOUS | Qty: 20 | Status: AC

## 2022-01-01 MED FILL — Fluorouracil IV Soln 2.5 GM/50ML (50 MG/ML): INTRAVENOUS | Qty: 13 | Status: AC

## 2022-01-01 MED FILL — Leucovorin Calcium For Inj 350 MG: INTRAMUSCULAR | Qty: 31.5 | Status: AC

## 2022-01-01 NOTE — Progress Notes (Signed)
[facility]  Clinical Social Work was referred by medical provider for assessment of psychosocial needs.  Clinical Social Worker contacted patient by phone  to offer support and assess for needs.  CSW left message with someone in the household/  CSW provided contact information and request for a return call.    FA   Adelene Amas, LCSW  Clinical Social Worker [facility]

## 2022-01-02 ENCOUNTER — Other Ambulatory Visit: Payer: Self-pay

## 2022-01-02 LAB — T4: T4, Total: 12.3 ug/dL — ABNORMAL HIGH (ref 4.5–12.0)

## 2022-01-04 ENCOUNTER — Inpatient Hospital Stay: Payer: Medicare Other | Admitting: Licensed Clinical Social Worker

## 2022-01-04 ENCOUNTER — Inpatient Hospital Stay: Payer: Medicare Other

## 2022-01-04 VITALS — BP 170/80 | HR 98 | Temp 98.2°F | Resp 18 | Ht 64.0 in | Wt 190.1 lb

## 2022-01-04 DIAGNOSIS — C169 Malignant neoplasm of stomach, unspecified: Secondary | ICD-10-CM | POA: Diagnosis not present

## 2022-01-04 DIAGNOSIS — Z79899 Other long term (current) drug therapy: Secondary | ICD-10-CM | POA: Diagnosis not present

## 2022-01-04 DIAGNOSIS — C160 Malignant neoplasm of cardia: Secondary | ICD-10-CM

## 2022-01-04 DIAGNOSIS — C168 Malignant neoplasm of overlapping sites of stomach: Secondary | ICD-10-CM

## 2022-01-04 DIAGNOSIS — Z5111 Encounter for antineoplastic chemotherapy: Secondary | ICD-10-CM | POA: Diagnosis not present

## 2022-01-04 MED ORDER — FLUOROURACIL CHEMO INJECTION 2.5 GM/50ML
320.0000 mg/m2 | Freq: Once | INTRAVENOUS | Status: AC
  Administered 2022-01-04: 650 mg via INTRAVENOUS
  Filled 2022-01-04: qty 13

## 2022-01-04 MED ORDER — DEXTROSE 5 % IV SOLN: Freq: Once | INTRAVENOUS | Status: AC

## 2022-01-04 MED ORDER — SODIUM CHLORIDE 0.9 % IV SOLN
10.0000 mg | Freq: Once | INTRAVENOUS | Status: AC
  Administered 2022-01-04: 10 mg via INTRAVENOUS
  Filled 2022-01-04: qty 10

## 2022-01-04 MED ORDER — SODIUM CHLORIDE 0.9 % IV SOLN
240.0000 mg | Freq: Once | INTRAVENOUS | Status: AC
  Administered 2022-01-04: 240 mg via INTRAVENOUS
  Filled 2022-01-04: qty 24

## 2022-01-04 MED ORDER — SODIUM CHLORIDE 0.9 % IV SOLN
1920.0000 mg/m2 | INTRAVENOUS | Status: DC
  Administered 2022-01-04: 3800 mg via INTRAVENOUS
  Filled 2022-01-04: qty 76

## 2022-01-04 MED ORDER — LEUCOVORIN CALCIUM INJECTION 350 MG
320.0000 mg/m2 | Freq: Once | INTRAVENOUS | Status: AC
  Administered 2022-01-04: 630 mg via INTRAVENOUS
  Filled 2022-01-04: qty 31.5

## 2022-01-04 MED ORDER — SODIUM CHLORIDE 0.9 % IV SOLN
150.0000 mg | Freq: Once | INTRAVENOUS | Status: AC
  Administered 2022-01-04: 150 mg via INTRAVENOUS
  Filled 2022-01-04: qty 150

## 2022-01-04 MED ORDER — PALONOSETRON HCL INJECTION 0.25 MG/5ML
0.2500 mg | Freq: Once | INTRAVENOUS | Status: AC
  Administered 2022-01-04: 0.25 mg via INTRAVENOUS
  Filled 2022-01-04: qty 5

## 2022-01-04 MED ORDER — OXALIPLATIN CHEMO INJECTION 100 MG/20ML
51.0000 mg/m2 | Freq: Once | INTRAVENOUS | Status: AC
  Administered 2022-01-04: 100 mg via INTRAVENOUS
  Filled 2022-01-04: qty 20

## 2022-01-04 NOTE — Patient Instructions (Signed)
Fluorouracil Injection What is this medication? FLUOROURACIL (flure oh YOOR a sil) treats some types of cancer. It works by slowing down the growth of cancer cells. This medicine may be used for other purposes; ask your health care provider or pharmacist if you have questions. COMMON BRAND NAME(S): Adrucil What should I tell my care team before I take this medication? They need to know if you have any of these conditions: Blood disorders Dihydropyrimidine dehydrogenase (DPD) deficiency Infection, such as chickenpox, cold sores, herpes Kidney disease Liver disease Poor nutrition Recent or ongoing radiation therapy An unusual or allergic reaction to fluorouracil, other medications, foods, dyes, or preservatives If you or your partner are pregnant or trying to get pregnant Breast-feeding How should I use this medication? This medication is injected into a vein. It is administered by your care team in a hospital or clinic setting. Talk to your care team about the use of this medication in children. Special care may be needed. Overdosage: If you think you have taken too much of this medicine contact a poison control center or emergency room at once. NOTE: This medicine is only for you. Do not share this medicine with others. What if I miss a dose? Keep appointments for follow-up doses. It is important not to miss your dose. Call your care team if you are unable to keep an appointment. What may interact with this medication? Do not take this medication with any of the following: Live virus vaccines This medication may also interact with the following: Medications that treat or prevent blood clots, such as warfarin, enoxaparin, dalteparin This list may not describe all possible interactions. Give your health care provider a list of all the medicines, herbs, non-prescription drugs, or dietary supplements you use. Also tell them if you smoke, drink alcohol, or use illegal drugs. Some items may  interact with your medicine. What should I watch for while using this medication? Your condition will be monitored carefully while you are receiving this medication. This medication may make you feel generally unwell. This is not uncommon as chemotherapy can affect healthy cells as well as cancer cells. Report any side effects. Continue your course of treatment even though you feel ill unless your care team tells you to stop. In some cases, you may be given additional medications to help with side effects. Follow all directions for their use. This medication may increase your risk of getting an infection. Call your care team for advice if you get a fever, chills, sore throat, or other symptoms of a cold or flu. Do not treat yourself. Try to avoid being around people who are sick. This medication may increase your risk to bruise or bleed. Call your care team if you notice any unusual bleeding. Be careful brushing or flossing your teeth or using a toothpick because you may get an infection or bleed more easily. If you have any dental work done, tell your dentist you are receiving this medication. Avoid taking medications that contain aspirin, acetaminophen, ibuprofen, naproxen, or ketoprofen unless instructed by your care team. These medications may hide a fever. Do not treat diarrhea with over the counter products. Contact your care team if you have diarrhea that lasts more than 2 days or if it is severe and watery. This medication can make you more sensitive to the sun. Keep out of the sun. If you cannot avoid being in the sun, wear protective clothing and sunscreen. Do not use sun lamps, tanning beds, or tanning booths. Talk to   your care team if you or your partner wish to become pregnant or think you might be pregnant. This medication can cause serious birth defects if taken during pregnancy and for 3 months after the last dose. A reliable form of contraception is recommended while taking this  medication and for 3 months after the last dose. Talk to your care team about effective forms of contraception. Do not father a child while taking this medication and for 3 months after the last dose. Use a condom while having sex during this time period. Do not breastfeed while taking this medication. This medication may cause infertility. Talk to your care team if you are concerned about your fertility. What side effects may I notice from receiving this medication? Side effects that you should report to your care team as soon as possible: Allergic reactions--skin rash, itching, hives, swelling of the face, lips, tongue, or throat Heart attack--pain or tightness in the chest, shoulders, arms, or jaw, nausea, shortness of breath, cold or clammy skin, feeling faint or lightheaded Heart failure--shortness of breath, swelling of the ankles, feet, or hands, sudden weight gain, unusual weakness or fatigue Heart rhythm changes--fast or irregular heartbeat, dizziness, feeling faint or lightheaded, chest pain, trouble breathing High ammonia level--unusual weakness or fatigue, confusion, loss of appetite, nausea, vomiting, seizures Infection--fever, chills, cough, sore throat, wounds that don't heal, pain or trouble when passing urine, general feeling of discomfort or being unwell Low red blood cell level--unusual weakness or fatigue, dizziness, headache, trouble breathing Pain, tingling, or numbness in the hands or feet, muscle weakness, change in vision, confusion or trouble speaking, loss of balance or coordination, trouble walking, seizures Redness, swelling, and blistering of the skin over hands and feet Severe or prolonged diarrhea Unusual bruising or bleeding Side effects that usually do not require medical attention (report to your care team if they continue or are bothersome): Dry skin Headache Increased tears Nausea Pain, redness, or swelling with sores inside the mouth or throat Sensitivity  to light Vomiting This list may not describe all possible side effects. Call your doctor for medical advice about side effects. You may report side effects to FDA at 1-800-FDA-1088. Where should I keep my medication? This medication is given in a hospital or clinic. It will not be stored at home. NOTE: This sheet is a summary. It may not cover all possible information. If you have questions about this medicine, talk to your doctor, pharmacist, or health care provider.  2023 Elsevier/Gold Standard (2021-06-02 00:00:00) Nivolumab Injection What is this medication? NIVOLUMAB (nye VOL ue mab) treats some types of cancer. It works by helping your immune system slow or stop the spread of cancer cells. It is a monoclonal antibody. This medicine may be used for other purposes; ask your health care provider or pharmacist if you have questions. COMMON BRAND NAME(S): Opdivo What should I tell my care team before I take this medication? They need to know if you have any of these conditions: Allogeneic stem cell transplant (uses someone else's stem cells) Autoimmune diseases, such as Crohn disease, ulcerative colitis, lupus History of chest radiation Nervous system problems, such as Guillain-Barre syndrome or myasthenia gravis Organ transplant An unusual or allergic reaction to nivolumab, other medications, foods, dyes, or preservatives Pregnant or trying to get pregnant Breast-feeding How should I use this medication? This medication is infused into a vein. It is given in a hospital or clinic setting. A special MedGuide will be given to you before each treatment. Be sure  to read this information carefully each time. Talk to your care team about the use of this medication in children. While it may be prescribed for children as young as 12 years for selected conditions, precautions do apply. Overdosage: If you think you have taken too much of this medicine contact a poison control center or emergency  room at once. NOTE: This medicine is only for you. Do not share this medicine with others. What if I miss a dose? Keep appointments for follow-up doses. It is important not to miss your dose. Call your care team if you are unable to keep an appointment. What may interact with this medication? Interactions have not been studied. This list may not describe all possible interactions. Give your health care provider a list of all the medicines, herbs, non-prescription drugs, or dietary supplements you use. Also tell them if you smoke, drink alcohol, or use illegal drugs. Some items may interact with your medicine. What should I watch for while using this medication? Your condition will be monitored carefully while you are receiving this medication. You may need blood work while taking this medication. This medication may cause serious skin reactions. They can happen weeks to months after starting the medication. Contact your care team right away if you notice fevers or flu-like symptoms with a rash. The rash may be red or purple and then turn into blisters or peeling of the skin. You may also notice a red rash with swelling of the face, lips, or lymph nodes in your neck or under your arms. Tell your care team right away if you have any change in your eyesight. Talk to your care team if you are pregnant or think you might be pregnant. A negative pregnancy test is required before starting this medication. A reliable form of contraception is recommended while taking this medication and for 5 months after the last dose. Talk to your care team about effective forms of contraception. Do not breast-feed while taking this medication and for 5 months after the last dose. What side effects may I notice from receiving this medication? Side effects that you should report to your care team as soon as possible: Allergic reactions--skin rash, itching, hives, swelling of the face, lips, tongue, or throat Dry cough,  shortness of breath or trouble breathing Eye pain, redness, irritation, or discharge with blurry or decreased vision Heart muscle inflammation--unusual weakness or fatigue, shortness of breath, chest pain, fast or irregular heartbeat, dizziness, swelling of the ankles, feet, or hands Hormone gland problems--headache, sensitivity to light, unusual weakness or fatigue, dizziness, fast or irregular heartbeat, increased sensitivity to cold or heat, excessive sweating, constipation, hair loss, increased thirst or amount of urine, tremors or shaking, irritability Infusion reactions--chest pain, shortness of breath or trouble breathing, feeling faint or lightheaded Kidney injury (glomerulonephritis)--decrease in the amount of urine, red or dark brown urine, foamy or bubbly urine, swelling of the ankles, hands, or feet Liver injury--right upper belly pain, loss of appetite, nausea, light-colored stool, dark yellow or brown urine, yellowing skin or eyes, unusual weakness or fatigue Pain, tingling, or numbness in the hands or feet, muscle weakness, change in vision, confusion or trouble speaking, loss of balance or coordination, trouble walking, seizures Rash, fever, and swollen lymph nodes Redness, blistering, peeling, or loosening of the skin, including inside the mouth Sudden or severe stomach pain, bloody diarrhea, fever, nausea, vomiting Side effects that usually do not require medical attention (report these to your care team if they continue or are  bothersome): Bone, joint, or muscle pain Diarrhea Fatigue Loss of appetite Nausea Skin rash This list may not describe all possible side effects. Call your doctor for medical advice about side effects. You may report side effects to FDA at 1-800-FDA-1088. Where should I keep my medication? This medication is given in a hospital or clinic. It will not be stored at home. NOTE: This sheet is a summary. It may not cover all possible information. If you have  questions about this medicine, talk to your doctor, pharmacist, or health care provider.  2023 Elsevier/Gold Standard (2021-06-01 00:00:00)

## 2022-01-04 NOTE — Progress Notes (Signed)
[facility]  Initial Assessment   Mackenzie Lewis is a 86 y.o. year old female contacted by phone. Clinical Social Work was referred by medical provider for assessment of psychosocial needs.   SDOH (Social Determinants of Health) assessments performed: Yes SDOH Interventions    Flowsheet Row Clinical Support from 01/04/2022 in [facility] from 10/20/2021 in [facility] Interventions    Food Insecurity Interventions Intervention Not Indicated --  Housing Interventions Intervention Not Indicated --  Transportation Interventions Patient Resources (Friends/Family), Intervention Not Indicated Intervention Not Indicated  Utilities Interventions Intervention Not Indicated --  Financial Strain Interventions Intervention Not Indicated --  Physical Activity Interventions Intervention Not Indicated --  Stress Interventions Intervention Not Indicated --  Social Connections Interventions Intervention Not Indicated --       SDOH Screenings   Food Insecurity: No Food Insecurity (01/04/2022)  Housing: Low Risk  (01/04/2022)  Transportation Needs: No Transportation Needs (01/04/2022)  Utilities: Not At Risk (01/04/2022)  Alcohol Screen: Low Risk  (10/20/2021)  Depression (PHQ2-9): Low Risk  (01/04/2022)  Financial Resource Strain: Low Risk  (01/04/2022)  Physical Activity: Inactive (01/04/2022)  Social Connections: Socially Integrated (01/04/2022)  Stress: No Stress Concern Present (01/04/2022)  Tobacco Use: Low Risk  (12/31/2021)     Distress Screen completed: No     No data to display            Family/Social Information:  Housing Arrangement: patient lives with partner Mackenzie Lewis (Daughter) [phone] (Mobile), main caregiver/contact Family members/support persons in your life? Family, Friends, Social worker, Education administrator, and Geophysical data processor concerns: no  Employment:  Retired  .  Income source: Paediatric nurse concerns: No Type of concern: None Food access concerns: no Religious or spiritual practice: Recruitment consultant Currently in place:  Medicare A&B, Tricare for Life  Coping/ Adjustment to diagnosis: Patient understands treatment plan and what happens next? yes Concerns about diagnosis and/or treatment: I'm not especially worried about anything Patient reported stressors:  N/A Hopes and/or priorities: N/A Patient enjoys time with family/ friends and attending and participating in church activities Current coping skills/ strengths: Ability for insight , Active sense of humor , Average or above average intelligence , Capable of independent living , Communication skills , Scientist, research (life sciences) , General fund of knowledge , Motivation for treatment/growth , Physical Health , Religious Affiliation , Special hobby/interest , Supportive family/friends , and Work skills     SUMMARY: Current SDOH Barriers:  N/A  Clinical Social Work Clinical Goal(s):  No clinical social work goals at this time  Interventions: Discussed common feeling and emotions when being diagnosed with cancer, and the importance of support during treatment Informed patient of the support team roles and support services at [facility] Provided Mackenzie Lewis contact information and encouraged patient to call with any questions or concerns Provided patient with information about CSW role in patient  care and other available resources.  No needs present at this time.   Follow Up Plan: Patient will contact CSW with any support or resource needs Patient verbalizes understanding of plan: Yes    Mackenzie Rapaport, LCSW

## 2022-01-06 ENCOUNTER — Inpatient Hospital Stay: Payer: Medicare Other

## 2022-01-06 VITALS — BP 137/61 | HR 85 | Temp 98.4°F

## 2022-01-06 DIAGNOSIS — C168 Malignant neoplasm of overlapping sites of stomach: Secondary | ICD-10-CM

## 2022-01-06 DIAGNOSIS — C169 Malignant neoplasm of stomach, unspecified: Secondary | ICD-10-CM | POA: Diagnosis not present

## 2022-01-06 DIAGNOSIS — Z79899 Other long term (current) drug therapy: Secondary | ICD-10-CM | POA: Diagnosis not present

## 2022-01-06 DIAGNOSIS — Z5111 Encounter for antineoplastic chemotherapy: Secondary | ICD-10-CM | POA: Diagnosis not present

## 2022-01-06 MED ORDER — SODIUM CHLORIDE 0.9% FLUSH
10.0000 mL | INTRAVENOUS | Status: DC | PRN
  Administered 2022-01-06: 10 mL

## 2022-01-06 MED ORDER — HEPARIN SOD (PORK) LOCK FLUSH 100 UNIT/ML IV SOLN
500.0000 [IU] | Freq: Once | INTRAVENOUS | Status: AC | PRN
  Administered 2022-01-06: 500 [IU]

## 2022-01-06 NOTE — Patient Instructions (Signed)
Fluorouracil Injection What is this medication? FLUOROURACIL (flure oh YOOR a sil) treats some types of cancer. It works by slowing down the growth of cancer cells. This medicine may be used for other purposes; ask your health care provider or pharmacist if you have questions. COMMON BRAND NAME(S): Adrucil What should I tell my care team before I take this medication? They need to know if you have any of these conditions: Blood disorders Dihydropyrimidine dehydrogenase (DPD) deficiency Infection, such as chickenpox, cold sores, herpes Kidney disease Liver disease Poor nutrition Recent or ongoing radiation therapy An unusual or allergic reaction to fluorouracil, other medications, foods, dyes, or preservatives If you or your partner are pregnant or trying to get pregnant Breast-feeding How should I use this medication? This medication is injected into a vein. It is administered by your care team in a hospital or clinic setting. Talk to your care team about the use of this medication in children. Special care may be needed. Overdosage: If you think you have taken too much of this medicine contact a poison control center or emergency room at once. NOTE: This medicine is only for you. Do not share this medicine with others. What if I miss a dose? Keep appointments for follow-up doses. It is important not to miss your dose. Call your care team if you are unable to keep an appointment. What may interact with this medication? Do not take this medication with any of the following: Live virus vaccines This medication may also interact with the following: Medications that treat or prevent blood clots, such as warfarin, enoxaparin, dalteparin This list may not describe all possible interactions. Give your health care provider a list of all the medicines, herbs, non-prescription drugs, or dietary supplements you use. Also tell them if you smoke, drink alcohol, or use illegal drugs. Some items may  interact with your medicine. What should I watch for while using this medication? Your condition will be monitored carefully while you are receiving this medication. This medication may make you feel generally unwell. This is not uncommon as chemotherapy can affect healthy cells as well as cancer cells. Report any side effects. Continue your course of treatment even though you feel ill unless your care team tells you to stop. In some cases, you may be given additional medications to help with side effects. Follow all directions for their use. This medication may increase your risk of getting an infection. Call your care team for advice if you get a fever, chills, sore throat, or other symptoms of a cold or flu. Do not treat yourself. Try to avoid being around people who are sick. This medication may increase your risk to bruise or bleed. Call your care team if you notice any unusual bleeding. Be careful brushing or flossing your teeth or using a toothpick because you may get an infection or bleed more easily. If you have any dental work done, tell your dentist you are receiving this medication. Avoid taking medications that contain aspirin, acetaminophen, ibuprofen, naproxen, or ketoprofen unless instructed by your care team. These medications may hide a fever. Do not treat diarrhea with over the counter products. Contact your care team if you have diarrhea that lasts more than 2 days or if it is severe and watery. This medication can make you more sensitive to the sun. Keep out of the sun. If you cannot avoid being in the sun, wear protective clothing and sunscreen. Do not use sun lamps, tanning beds, or tanning booths. Talk to   your care team if you or your partner wish to become pregnant or think you might be pregnant. This medication can cause serious birth defects if taken during pregnancy and for 3 months after the last dose. A reliable form of contraception is recommended while taking this  medication and for 3 months after the last dose. Talk to your care team about effective forms of contraception. Do not father a child while taking this medication and for 3 months after the last dose. Use a condom while having sex during this time period. Do not breastfeed while taking this medication. This medication may cause infertility. Talk to your care team if you are concerned about your fertility. What side effects may I notice from receiving this medication? Side effects that you should report to your care team as soon as possible: Allergic reactions--skin rash, itching, hives, swelling of the face, lips, tongue, or throat Heart attack--pain or tightness in the chest, shoulders, arms, or jaw, nausea, shortness of breath, cold or clammy skin, feeling faint or lightheaded Heart failure--shortness of breath, swelling of the ankles, feet, or hands, sudden weight gain, unusual weakness or fatigue Heart rhythm changes--fast or irregular heartbeat, dizziness, feeling faint or lightheaded, chest pain, trouble breathing High ammonia level--unusual weakness or fatigue, confusion, loss of appetite, nausea, vomiting, seizures Infection--fever, chills, cough, sore throat, wounds that don't heal, pain or trouble when passing urine, general feeling of discomfort or being unwell Low red blood cell level--unusual weakness or fatigue, dizziness, headache, trouble breathing Pain, tingling, or numbness in the hands or feet, muscle weakness, change in vision, confusion or trouble speaking, loss of balance or coordination, trouble walking, seizures Redness, swelling, and blistering of the skin over hands and feet Severe or prolonged diarrhea Unusual bruising or bleeding Side effects that usually do not require medical attention (report to your care team if they continue or are bothersome): Dry skin Headache Increased tears Nausea Pain, redness, or swelling with sores inside the mouth or throat Sensitivity  to light Vomiting This list may not describe all possible side effects. Call your doctor for medical advice about side effects. You may report side effects to FDA at 1-800-FDA-1088. Where should I keep my medication? This medication is given in a hospital or clinic. It will not be stored at home. NOTE: This sheet is a summary. It may not cover all possible information. If you have questions about this medicine, talk to your doctor, pharmacist, or health care provider.  2023 [employer] (2021-06-02 00:00:00)  

## 2022-01-11 ENCOUNTER — Other Ambulatory Visit: Payer: Self-pay | Admitting: Oncology

## 2022-01-11 DIAGNOSIS — J0100 Acute maxillary sinusitis, unspecified: Secondary | ICD-10-CM

## 2022-01-11 MED ORDER — AMOXICILLIN-POT CLAVULANATE 875-125 MG PO TABS: 1.0000 | ORAL_TABLET | Freq: Two times a day (BID) | ORAL | 0 refills | Status: AC

## 2022-01-12 ENCOUNTER — Telehealth: Payer: Self-pay

## 2022-01-12 DIAGNOSIS — R300 Dysuria: Secondary | ICD-10-CM | POA: Diagnosis not present

## 2022-01-12 DIAGNOSIS — N3090 Cystitis, unspecified without hematuria: Secondary | ICD-10-CM | POA: Diagnosis not present

## 2022-01-12 DIAGNOSIS — R509 Fever, unspecified: Secondary | ICD-10-CM | POA: Diagnosis not present

## 2022-01-12 DIAGNOSIS — J019 Acute sinusitis, unspecified: Secondary | ICD-10-CM | POA: Diagnosis not present

## 2022-01-12 DIAGNOSIS — R051 Acute cough: Secondary | ICD-10-CM | POA: Diagnosis not present

## 2022-01-12 DIAGNOSIS — R0981 Nasal congestion: Secondary | ICD-10-CM | POA: Diagnosis not present

## 2022-01-12 NOTE — Telephone Encounter (Signed)
-----   Message from Derwood Kaplan, MD sent at 01/11/2022  5:30 PM EST ----- Regarding: RE: Sinus issues I think she needs ab, will send in tonight. If not better in a few days, we need to see her with labs ----- Message ----- From: Belva Chimes, LPN Sent: 09/31/1216  12:01 PM EST To: Derwood Kaplan, MD Subject: Sinus issues                                   Patient called to let you know that her sinus issues have gotten worse. She tried to get appointment with PCP but no appointments available. Patient states she has dark colored mucus along with some blood tinge and is weak feeling. Patient would like to know what she needs to do?

## 2022-01-12 NOTE — Telephone Encounter (Signed)
Patient notified. Patient did go to Urgent Care. They placed patient on Doxycycline. Patient will start taking the Doxycycline.

## 2022-01-14 ENCOUNTER — Inpatient Hospital Stay: Payer: Medicare Other

## 2022-01-14 DIAGNOSIS — C169 Malignant neoplasm of stomach, unspecified: Secondary | ICD-10-CM | POA: Diagnosis not present

## 2022-01-14 DIAGNOSIS — C168 Malignant neoplasm of overlapping sites of stomach: Secondary | ICD-10-CM

## 2022-01-14 DIAGNOSIS — Z79899 Other long term (current) drug therapy: Secondary | ICD-10-CM | POA: Diagnosis not present

## 2022-01-14 DIAGNOSIS — Z5111 Encounter for antineoplastic chemotherapy: Secondary | ICD-10-CM | POA: Diagnosis not present

## 2022-01-14 LAB — CMP (CANCER CENTER ONLY)
ALT: 19 U/L (ref 0–44)
AST: 24 U/L (ref 15–41)
Albumin: 4 g/dL (ref 3.5–5.0)
Alkaline Phosphatase: 69 U/L (ref 38–126)
Anion gap: 9 (ref 5–15)
BUN: 16 mg/dL (ref 8–23)
CO2: 25 mmol/L (ref 22–32)
Calcium: 9.1 mg/dL (ref 8.9–10.3)
Chloride: 107 mmol/L (ref 98–111)
Creatinine: 1.02 mg/dL — ABNORMAL HIGH (ref 0.44–1.00)
GFR, Estimated: 54 mL/min — ABNORMAL LOW (ref >60)
Glucose, Bld: 127 mg/dL — ABNORMAL HIGH (ref 70–99)
Potassium: 3.7 mmol/L (ref 3.5–5.1)
Sodium: 141 mmol/L (ref 135–145)
Total Bilirubin: 0.5 mg/dL (ref 0.3–1.2)
Total Protein: 6.6 g/dL (ref 6.5–8.1)

## 2022-01-14 LAB — CBC WITH DIFFERENTIAL (CANCER CENTER ONLY)
Abs Immature Granulocytes: 0.02 10*3/uL (ref 0.00–0.07)
Basophils Absolute: 0.1 10*3/uL (ref 0.0–0.1)
Basophils Relative: 1 %
Eosinophils Absolute: 0.5 10*3/uL (ref 0.0–0.5)
Eosinophils Relative: 9 %
HCT: 37.7 % (ref 36.0–46.0)
Hemoglobin: 12.8 g/dL (ref 12.0–15.0)
Immature Granulocytes: 0 %
Lymphocytes Relative: 41 %
Lymphs Abs: 2.1 10*3/uL (ref 0.7–4.0)
MCH: 30.8 pg (ref 26.0–34.0)
MCHC: 34 g/dL (ref 30.0–36.0)
MCV: 90.8 fL (ref 80.0–100.0)
Monocytes Absolute: 0.7 10*3/uL (ref 0.1–1.0)
Monocytes Relative: 12 %
Neutro Abs: 2 10*3/uL (ref 1.7–7.7)
Neutrophils Relative %: 37 %
Platelet Count: 147 10*3/uL — ABNORMAL LOW (ref 150–400)
RBC: 4.15 MIL/uL (ref 3.87–5.11)
RDW: 14.7 % (ref 11.5–15.5)
WBC Count: 5.3 10*3/uL (ref 4.0–10.5)
nRBC: 0 % (ref 0.0–0.2)

## 2022-01-14 LAB — TSH: TSH: 2.604 u[IU]/mL (ref 0.350–4.500)

## 2022-01-15 ENCOUNTER — Other Ambulatory Visit: Payer: TRICARE For Life (TFL)

## 2022-01-15 LAB — T4: T4, Total: 13.2 ug/dL — ABNORMAL HIGH (ref 4.5–12.0)

## 2022-01-15 MED FILL — Fluorouracil IV Soln 5 GM/100ML (50 MG/ML): INTRAVENOUS | Qty: 76 | Status: AC

## 2022-01-15 MED FILL — Dexamethasone Sodium Phosphate Inj 100 MG/10ML: INTRAMUSCULAR | Qty: 1 | Status: AC

## 2022-01-15 MED FILL — Fluorouracil IV Soln 2.5 GM/50ML (50 MG/ML): INTRAVENOUS | Qty: 13 | Status: AC

## 2022-01-15 MED FILL — Fosaprepitant Dimeglumine For IV Infusion 150 MG (Base Eq): INTRAVENOUS | Qty: 5 | Status: AC

## 2022-01-15 MED FILL — Oxaliplatin IV Soln 100 MG/20ML: INTRAVENOUS | Qty: 20 | Status: AC

## 2022-01-15 MED FILL — Nivolumab IV Soln 100 MG/10ML: INTRAVENOUS | Qty: 24 | Status: AC

## 2022-01-15 MED FILL — Leucovorin Calcium For Inj 350 MG: INTRAMUSCULAR | Qty: 31.5 | Status: AC

## 2022-01-18 ENCOUNTER — Encounter: Payer: Self-pay | Admitting: Oncology

## 2022-01-18 ENCOUNTER — Inpatient Hospital Stay: Payer: Medicare Other | Attending: Hematology and Oncology

## 2022-01-18 VITALS — BP 145/83 | HR 95 | Temp 98.4°F | Resp 18 | Ht 64.0 in | Wt 192.0 lb

## 2022-01-18 DIAGNOSIS — C169 Malignant neoplasm of stomach, unspecified: Secondary | ICD-10-CM | POA: Diagnosis present

## 2022-01-18 DIAGNOSIS — Z5111 Encounter for antineoplastic chemotherapy: Secondary | ICD-10-CM | POA: Diagnosis present

## 2022-01-18 DIAGNOSIS — Z79899 Other long term (current) drug therapy: Secondary | ICD-10-CM | POA: Diagnosis not present

## 2022-01-18 DIAGNOSIS — C168 Malignant neoplasm of overlapping sites of stomach: Secondary | ICD-10-CM

## 2022-01-18 MED ORDER — SODIUM CHLORIDE 0.9% FLUSH: 10.0000 mL | INTRAVENOUS | Status: DC | PRN

## 2022-01-18 MED ORDER — SODIUM CHLORIDE 0.9 % IV SOLN
150.0000 mg | Freq: Once | INTRAVENOUS | Status: AC
  Administered 2022-01-18: 150 mg via INTRAVENOUS
  Filled 2022-01-18: qty 150

## 2022-01-18 MED ORDER — HEPARIN SOD (PORK) LOCK FLUSH 100 UNIT/ML IV SOLN: 500.0000 [IU] | Freq: Once | INTRAVENOUS | Status: DC | PRN

## 2022-01-18 MED ORDER — DEXTROSE 5 % IV SOLN: Freq: Once | INTRAVENOUS | Status: AC

## 2022-01-18 MED ORDER — SODIUM CHLORIDE 0.9 % IV SOLN
10.0000 mg | Freq: Once | INTRAVENOUS | Status: AC
  Administered 2022-01-18: 10 mg via INTRAVENOUS
  Filled 2022-01-18: qty 10

## 2022-01-18 MED ORDER — OXALIPLATIN CHEMO INJECTION 100 MG/20ML
51.0000 mg/m2 | Freq: Once | INTRAVENOUS | Status: AC
  Administered 2022-01-18: 100 mg via INTRAVENOUS
  Filled 2022-01-18: qty 20

## 2022-01-18 MED ORDER — PALONOSETRON HCL INJECTION 0.25 MG/5ML
0.2500 mg | Freq: Once | INTRAVENOUS | Status: AC
  Administered 2022-01-18: 0.25 mg via INTRAVENOUS
  Filled 2022-01-18: qty 5

## 2022-01-18 MED ORDER — LEUCOVORIN CALCIUM INJECTION 350 MG
320.0000 mg/m2 | Freq: Once | INTRAVENOUS | Status: AC
  Administered 2022-01-18: 630 mg via INTRAVENOUS
  Filled 2022-01-18: qty 31.5

## 2022-01-18 MED ORDER — SODIUM CHLORIDE 0.9 % IV SOLN
1920.0000 mg/m2 | INTRAVENOUS | Status: DC
  Administered 2022-01-18: 3800 mg via INTRAVENOUS
  Filled 2022-01-18: qty 76

## 2022-01-18 MED ORDER — SODIUM CHLORIDE 0.9 % IV SOLN
240.0000 mg | Freq: Once | INTRAVENOUS | Status: AC
  Administered 2022-01-18: 240 mg via INTRAVENOUS
  Filled 2022-01-18: qty 24

## 2022-01-18 MED ORDER — FLUOROURACIL CHEMO INJECTION 2.5 GM/50ML
320.0000 mg/m2 | Freq: Once | INTRAVENOUS | Status: AC
  Administered 2022-01-18: 650 mg via INTRAVENOUS
  Filled 2022-01-18: qty 13

## 2022-01-18 NOTE — Patient Instructions (Signed)
Fluorouracil Injection What is this medication? FLUOROURACIL (flure oh YOOR a sil) treats some types of cancer. It works by slowing down the growth of cancer cells. This medicine may be used for other purposes; ask your health care provider or pharmacist if you have questions. COMMON BRAND NAME(S): Adrucil What should I tell my care team before I take this medication? They need to know if you have any of these conditions: Blood disorders Dihydropyrimidine dehydrogenase (DPD) deficiency Infection, such as chickenpox, cold sores, herpes Kidney disease Liver disease Poor nutrition Recent or ongoing radiation therapy An unusual or allergic reaction to fluorouracil, other medications, foods, dyes, or preservatives If you or your partner are pregnant or trying to get pregnant Breast-feeding How should I use this medication? This medication is injected into a vein. It is administered by your care team in a hospital or clinic setting. Talk to your care team about the use of this medication in children. Special care may be needed. Overdosage: If you think you have taken too much of this medicine contact a poison control center or emergency room at once. NOTE: This medicine is only for you. Do not share this medicine with others. What if I miss a dose? Keep appointments for follow-up doses. It is important not to miss your dose. Call your care team if you are unable to keep an appointment. What may interact with this medication? Do not take this medication with any of the following: Live virus vaccines This medication may also interact with the following: Medications that treat or prevent blood clots, such as warfarin, enoxaparin, dalteparin This list may not describe all possible interactions. Give your health care provider a list of all the medicines, herbs, non-prescription drugs, or dietary supplements you use. Also tell them if you smoke, drink alcohol, or use illegal drugs. Some items may  interact with your medicine. What should I watch for while using this medication? Your condition will be monitored carefully while you are receiving this medication. This medication may make you feel generally unwell. This is not uncommon as chemotherapy can affect healthy cells as well as cancer cells. Report any side effects. Continue your course of treatment even though you feel ill unless your care team tells you to stop. In some cases, you may be given additional medications to help with side effects. Follow all directions for their use. This medication may increase your risk of getting an infection. Call your care team for advice if you get a fever, chills, sore throat, or other symptoms of a cold or flu. Do not treat yourself. Try to avoid being around people who are sick. This medication may increase your risk to bruise or bleed. Call your care team if you notice any unusual bleeding. Be careful brushing or flossing your teeth or using a toothpick because you may get an infection or bleed more easily. If you have any dental work done, tell your dentist you are receiving this medication. Avoid taking medications that contain aspirin, acetaminophen, ibuprofen, naproxen, or ketoprofen unless instructed by your care team. These medications may hide a fever. Do not treat diarrhea with over the counter products. Contact your care team if you have diarrhea that lasts more than 2 days or if it is severe and watery. This medication can make you more sensitive to the sun. Keep out of the sun. If you cannot avoid being in the sun, wear protective clothing and sunscreen. Do not use sun lamps, tanning beds, or tanning booths. Talk to   your care team if you or your partner wish to become pregnant or think you might be pregnant. This medication can cause serious birth defects if taken during pregnancy and for 3 months after the last dose. A reliable form of contraception is recommended while taking this  medication and for 3 months after the last dose. Talk to your care team about effective forms of contraception. Do not father a child while taking this medication and for 3 months after the last dose. Use a condom while having sex during this time period. Do not breastfeed while taking this medication. This medication may cause infertility. Talk to your care team if you are concerned about your fertility. What side effects may I notice from receiving this medication? Side effects that you should report to your care team as soon as possible: Allergic reactions--skin rash, itching, hives, swelling of the face, lips, tongue, or throat Heart attack--pain or tightness in the chest, shoulders, arms, or jaw, nausea, shortness of breath, cold or clammy skin, feeling faint or lightheaded Heart failure--shortness of breath, swelling of the ankles, feet, or hands, sudden weight gain, unusual weakness or fatigue Heart rhythm changes--fast or irregular heartbeat, dizziness, feeling faint or lightheaded, chest pain, trouble breathing High ammonia level--unusual weakness or fatigue, confusion, loss of appetite, nausea, vomiting, seizures Infection--fever, chills, cough, sore throat, wounds that don't heal, pain or trouble when passing urine, general feeling of discomfort or being unwell Low red blood cell level--unusual weakness or fatigue, dizziness, headache, trouble breathing Pain, tingling, or numbness in the hands or feet, muscle weakness, change in vision, confusion or trouble speaking, loss of balance or coordination, trouble walking, seizures Redness, swelling, and blistering of the skin over hands and feet Severe or prolonged diarrhea Unusual bruising or bleeding Side effects that usually do not require medical attention (report to your care team if they continue or are bothersome): Dry skin Headache Increased tears Nausea Pain, redness, or swelling with sores inside the mouth or throat Sensitivity  to light Vomiting This list may not describe all possible side effects. Call your doctor for medical advice about side effects. You may report side effects to FDA at 1-800-FDA-1088. Where should I keep my medication? This medication is given in a hospital or clinic. It will not be stored at home. NOTE: This sheet is a summary. It may not cover all possible information. If you have questions about this medicine, talk to your doctor, pharmacist, or health care provider.  2023 [employer] (2021-06-02 00:00:00)  

## 2022-01-20 ENCOUNTER — Inpatient Hospital Stay: Payer: Medicare Other

## 2022-01-20 VITALS — BP 139/63 | HR 88 | Temp 97.9°F | Resp 18

## 2022-01-20 DIAGNOSIS — Z5111 Encounter for antineoplastic chemotherapy: Secondary | ICD-10-CM | POA: Diagnosis not present

## 2022-01-20 DIAGNOSIS — Z79899 Other long term (current) drug therapy: Secondary | ICD-10-CM | POA: Diagnosis not present

## 2022-01-20 DIAGNOSIS — C169 Malignant neoplasm of stomach, unspecified: Secondary | ICD-10-CM | POA: Diagnosis not present

## 2022-01-20 DIAGNOSIS — C168 Malignant neoplasm of overlapping sites of stomach: Secondary | ICD-10-CM

## 2022-01-20 MED ORDER — SODIUM CHLORIDE 0.9% FLUSH
10.0000 mL | INTRAVENOUS | Status: DC | PRN
  Administered 2022-01-20: 10 mL

## 2022-01-20 MED ORDER — HEPARIN SOD (PORK) LOCK FLUSH 100 UNIT/ML IV SOLN
500.0000 [IU] | Freq: Once | INTRAVENOUS | Status: AC | PRN
  Administered 2022-01-20: 500 [IU]

## 2022-01-20 NOTE — Patient Instructions (Signed)
The chemotherapy medication bag should finish at 46 hours, 96 hours, or 7 days. For example, if your pump is scheduled for 46 hours and it was put on at 4:00 p.m., it should finish at 2:00 p.m. the day it is scheduled to come off regardless of your appointment time.     Estimated time to finish at TODAY .   If the display on your pump reads "Low Volume" and it is beeping, take the batteries out of the pump and come to the cancer center for it to be taken off.   If the pump alarms go off prior to the pump reading "Low Volume" then call [phone] and someone can assist you.  If the plunger comes out and the chemotherapy medication is leaking out, please use your home chemo spill kit to clean up the spill. Do NOT use paper towels or other household products.  If you have problems or questions regarding your pump, please call either [phone] (24 hours a day) or the cancer center Monday-Friday 8:00 a.m.- 4:30 p.m. at the clinic number and we will assist you. If you are unable to get assistance, then go to the nearest Emergency Department and ask the staff to contact the IV team for assistance.

## 2022-01-20 NOTE — Progress Notes (Signed)
RED RAISED RASH NOTED TO ARMS, LEGS AND TRUNK- SUSAN PHY PHARMD OUT TO SEE PATIENT- PATIENT STATES UNDERSTANDING TO STOP DOXCYLCINE AND WILL TAKE BENADRYL AT HOME TO MANAGE SYMPTOMS

## 2022-01-21 ENCOUNTER — Other Ambulatory Visit: Payer: TRICARE For Life (TFL)

## 2022-01-21 ENCOUNTER — Ambulatory Visit: Payer: TRICARE For Life (TFL) | Admitting: Oncology

## 2022-01-21 NOTE — Progress Notes (Signed)
[facility]  [address] [place],  [place]  [address] [phone]  Clinic Day: 01/28/22   Referring physician: Ernestene Kiel, MD   ASSESSMENT & PLAN:   Gastric adenocarcinoma This is a poorly differentiated adenocarcinoma with signet ring features and ulceration.  I would consider it as metastatic to the omentum and so that would stage this as a T4 N0 M1, stage IVB.  PET scan did not show evidence of metastatic disease other than the omentum. She is on treatment with chemotherapy of FOLFOX and immunotherapy with nivolumab to improve the success rate. Stain for HER2 was negative at 0. She is tolerating treatment well other than mild nausea and mouth sores.    Plan: She will proceed with her FOLFOX on 02/01/22 and we will hold the Nivolumab. We will see her back on January 2nd with CBC,CMP, TSH and T4 before her next cycle and plan to add the immunotherapy back at that time, if she is doing well. At this time I would consider her allergic to doxycycline but I cannot rule out that the rash is caused by her immunotherapy. Her sinusitis symptoms are mild at this time so we will not give further antibiotics unless she has worsening symptoms. I reviewed all of this with her and her family and answered their questions. She understands and agrees with this plan.  I provided 20 minutes of face-to-face time during this this encounter and > 50% was spent counseling as documented under my assessment and plan.    Derwood Kaplan, MD [facility] [address] [place] [address] Dept: [phone] Dept Fax: [phone]   CHIEF COMPLAINT:  CC: Gastric cancer  Current Treatment: Chemotherapy/immunotherapy   HISTORY OF PRESENT ILLNESS:  Mackenzie Lewis is a 86 y.o. female with a history of gastric cancer who is referred in consultation with Dr. Sandria Senter for assessment and  management.  She had noticed that she was having regurgitation when eating and had lost over 30 pounds.  An ultrasound was done, revealing hepatic steatosis, and led to an MRI scan on June 5 which revealed gastric wall thickening with confluent nodularity of the omentum anterior to the stomach measuring 3.5 cm consistent with metastatic tumor.  She also had low-grade edema and wall thickening extending into the duodenum from the stomach.  She was referred to Dr. Sandria Senter and he did an EGD on July 7.  This revealed a large ulceration measuring 1.2 cm along the greater curvature.  She also had diffusely edematous and erythematous wall with erosions of the antrum and stiff and friable mucosa with oozing of blood.  These findings extended to the gastric fundus as well.  Pathology revealed a poorly differentiated adenocarcinoma with signet ring features from the biopsies of the ulcer as well as the antrum and the fundus of the stomach.  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  She was placed on omeprazole.  Her test for H. pylori was negative.  She was referred to Dr. Kendell Bane for consideration of surgery but he felt this was not resectable because of the extensive involvement and I agree.  I would consider this extending to the duodenum and also metastatic to the omentum.  PET scan confirmed these findings and she wished to pursue systemic intravenous therapy.  She continues to eat and has adjusted her diet to softer foods and liquids and is  drinking boost so she has maintained her weight.  She does have some Zofran ODT for nausea when needed.  She has had some anorexia, nausea, occasional vomiting, early satiety, dysphagia, and belching.  A CEA and CA 19-9 were normal. She has been started on FOLFOX chemotherapy along with immunotherapy.  INTERVAL HISTORY:  I have reviewed her chart and materials related to her cancer extensively and collaborated  history with the patient. Summary of oncologic history is as follows: Oncology History  Gastric cancer ([place])  09/10/2021 Initial Diagnosis   Gastric cancer ([place])   09/10/2021 Cancer Staging   Staging form: Stomach, AJCC 8th Edition - Clinical stage from 09/10/2021: Stage IVB (cT4b, cN0, cM1) - Signed by Derwood Kaplan, MD on 09/10/2021 Histopathologic type: Adenocarcinoma, NOS Stage prefix: Initial diagnosis Total positive nodes: 0 Histologic grade (G): G3 Histologic grading system: 3 grade system Sites of metastasis: Peritoneal surface Diagnostic confirmation: Positive histology PLUS positive immunophenotyping and/or positive genetic studies Specimen type: Endoscopy with Biopsy Staged by: Managing physician Carcinoembryonic antigen (CEA) (ng/mL): 2.8 Carbohydrate antigen 19-9 (CA 19-9) (U/mL): 4.9 HER2 status: Unknown Microsatellite instability (MSI): Unknown Tumor location in stomach: Other Clinical staging modalities: Biopsy, Endoscopy Stage used in treatment planning: Yes National guidelines used in treatment planning: Yes Type of national guideline used in treatment planning: NCCN   09/28/2021 - 10/14/2021 Chemotherapy   Patient is on Treatment Plan : GASTROESOPHAGEAL FOLFOX + Nivolumab q14d     09/28/2021 -  Chemotherapy   Patient is on Treatment Plan : GASTROESOPHAGEAL FOLFOX + Nivolumab q14d     12/09/2021 Genetic Testing   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  Report date is 12/09/2021.   The Multi-Cancer + RNA Panel offered by [facility] includes sequencing and/or deletion/duplication analysis of the following 84 genes:  AIP*, ALK, APC*, ATM*, AXIN2*, BAP1*, BARD1*, BLM*, BMPR1A*, BRCA1*, BRCA2*, BRIP1*, CASR, CDC73*, CDH1*, CDK4, CDKN1B*, CDKN1C*, CDKN2A, CEBPA, CHEK2*, CTNNA1*, DICER1*, DIS3L2*, EGFR, EPCAM, FH*, FLCN*, GATA2*, GPC3, GREM1, HOXB13, HRAS, KIT, MAX*, MEN1*, MET, MITF, MLH1*, MSH2*, MSH3*, MSH6*, MUTYH*, NBN*,  NF1*, NF2*, NTHL1*, PALB2*, PDGFRA, PHOX2B, PMS2*, POLD1*, POLE*, POT1*, PRKAR1A*, PTCH1*, PTEN*, RAD50*, RAD51C*, RAD51D*, RB1*, RECQL4, RET, RUNX1*, SDHA*, SDHAF2*, SDHB*, SDHC*, SDHD*, SMAD4*, SMARCA4*, SMARCB1*, SMARCE1*, STK11*, SUFU*, TERC, TERT, TMEM127*, Tp53*, TSC1*, TSC2*, VHL*, WRN*, and WT1.  RNA analysis is performed for * genes.    INTERVAL HISTORY Mackenzie Lewis is seen in the clinic for follow up of her gastric cancer.  She states she has been very tired. She took an antibiotic for 5 days, she went to her infusion and the next day she broke out all over her arms and legs. She no longer is taking the antibiotic, which was Doxycycline. The pharmacist notes that she had a mild rash prior to her infusion. She does note that it itches but it has not bothered her. She is on FOLFOX and  nivolumab every 2 weeks and is due for her tenth dose scheduled for 02/01/22. We will hold the immunotherapy for the 10th cycle and possibly resume in January. She plans on going to the beach to see her granddaughter on the 26th/27th for about a week after her treatment. She notes that she has been seeing blood when blowing her nose in the morning from her sinus infection. She states the mucus is slightly yellow but more bloody. CBC and CMP are normal. She denies fever, chills, night sweats, or other signs of infection. She denies cardiorespiratory and gastrointestinal issues. She  denies pain. Her appetite is good.  She has gained 2 pounds since her last visit.    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  If that happens, she is to call us so that we can schedule her for an ultrasound to look for synovitis.     Essential hypertension 09/09/2016   Gastric cancer ([place]) 09/10/2021   Hyperlipidemia 09/01/2016   Hypertension    Hypothyroidism 09/01/2016   Osteoarthritis of both feet 01/20/2016   Osteoarthritis, hand 01/20/2016   Thyroid disease   Degenerative disc disease History of endometriosis  Past Surgical History:  Procedure Laterality Date   APPENDECTOMY     LAPAROSCOPIC APPENDECTOMY N/A 04/10/2016   Procedure: APPENDECTOMY LAPAROSCOPIC;  Surgeon: Coralie Keens, MD;  Location: [facility];  Service: General;  Laterality: N/A;  Bilateral tubal ligation Total hysterectomy and bilateral salpingo-oophorectomy in 1980  Family History  Problem Relation Age of Onset   Hypertension Mother    Prostate cancer Father        metastatic; d. 14   Brain cancer Sister 36   Breast cancer Sister 106   AAA (abdominal aortic aneurysm) Brother    Leukemia Cousin        x2 maternal female cousins; d. before 16   Breast cancer Daughter 57       DCIS  Her sister has had breast cancer as well as a tumor of her head Her daughter has had breast cancer last year  Social History:  reports that she has never smoked. She has never used smokeless tobacco. She reports that she does not currently use alcohol. She reports that she does not use drugs.The patient is accompanied by her son, daughter-in-law and daughter today.  She is single but lives with a significant other.  She has the 2 children.  She worked in [employer] (medical) and denies any chemical or toxin exposures.  She grew up in [place] and [place].  She is active and healthy, especially for her age.  Allergies:  Allergies  Allergen Reactions   Doxycycline Rash   Sulfa Antibiotics Rash    Other reaction(s): Other (See Comments) "Made me feel weird"     Current Medications: Current Outpatient Medications  Medication Sig Dispense Refill   aspirin 81 MG EC tablet Take 81 mg by mouth daily. Swallow whole.     Calcium Carbonate (CALCIUM 600 PO) Take 1 tablet by mouth daily.     Cholecalciferol (VITAMIN D3) 5000 units CAPS Take 1 capsule by mouth daily.     EXFORGE HCT 5-160-12.5 MG TABS Take 1 tablet by mouth daily.     famotidine (PEPCID) 40 MG tablet Take 40 mg by mouth at bedtime.     KRILL OIL PO Take 1 capsule by mouth daily. Unknown strenght     Lactobacillus TABS Take 1 tablet by mouth 2 (two) times daily.     levothyroxine (SYNTHROID, LEVOTHROID) 75 MCG tablet Take 75 mcg by mouth daily before breakfast.  LORazepam (ATIVAN) 0.5 MG tablet Take 1 tablet (0.5 mg total) by mouth at bedtime. 30 tablet 0   NON FORMULARY MMW: 3 parts Maalox 2 parts Benadryl 1 part viscious lidicaine  Disp. 6oz  Instructions: 42m swish and swallow every 3-4 hours     omeprazole (PRILOSEC) 40 MG capsule Take 1 capsule (40 mg total) by mouth 2 (two) times daily. 60 capsule 5   ondansetron (ZOFRAN) 4 MG tablet Take 4 mg by mouth every 4 (four) hours as needed.     ondansetron (ZOFRAN-ODT) 4 MG disintegrating tablet Take 1 tablet (4 mg total) by mouth every 8 (eight) hours as needed for nausea or vomiting. 90 tablet 0   polyethylene glycol powder (GLYCOLAX/MIRALAX) 17 GM/SCOOP powder SMARTSIG:1 scoopful By Mouth Daily     potassium chloride SA (KLOR-CON M) 20 MEQ tablet Take 1 tablet (20 mEq total) by mouth daily. 30 tablet 5   pravastatin (PRAVACHOL) 20 MG tablet Take 1 tablet (20 mg total) by mouth every evening. 90 tablet 3   Probiotic Product (PROBIOTIC DAILY PO) Take 1 tablet by mouth daily.     prochlorperazine (COMPAZINE) 10 MG tablet Take 1 tablet (10 mg total) by mouth every 6 (six) hours as needed for nausea or vomiting. 90 tablet 3   triamcinolone 0.1% oint-Eucerin equivalent cream 1:1 mixture Apply topically 2 (two) times daily. 160 g 0    triamcinolone cream (KENALOG) 0.1 % Apply to affected skin twice daily 160 g 0   No current facility-administered medications for this visit.    REVIEW OF SYSTEMS:  Review of Systems  Eyes: Negative.   Respiratory: Negative.    Cardiovascular: Negative.   Gastrointestinal:  Positive for constipation and nausea.       She does have some dysphagia and regurgitation with certain foods.  She has to eat very slowly.  Genitourinary: Negative.    Musculoskeletal: Negative.   Skin:  Positive for itching and rash.       Diffuse maculopapular rash of all extremities.   Neurological: Negative.   Hematological: Negative.   Psychiatric/Behavioral: Negative.        VITALS:  Blood pressure (!) 174/78, pulse 88, temperature 98.3 F (36.8 C), temperature source Oral, resp. rate 18, height _0  (1.626 m), weight 190 lb (86.2 kg), SpO2 94 %.  Wt Readings from Last 3 Encounters:  02/26/22 188 lb 3.2 oz (85.4 kg)  02/16/22 187 lb 3.2 oz (84.9 kg)  02/01/22 190 lb 1.9 oz (86.2 kg)    Body mass index is 32.61 kg/m.  Performance status (ECOG): 1 - Symptomatic but completely ambulatory  PHYSICAL EXAM:  Physical Exam Constitutional:      General: She is not in acute distress.    Appearance: Normal appearance. She is not ill-appearing or toxic-appearing.  HENT:     Head: Normocephalic and atraumatic.     Nose: Nose normal.     Mouth/Throat:     Pharynx: Oropharynx is clear.  Eyes:     General:        Right eye: No discharge.        Left eye: No discharge.     Extraocular Movements: Extraocular movements intact.     Conjunctiva/sclera: Conjunctivae normal.     Pupils: Pupils are equal, round, and reactive to light.  Cardiovascular:     Rate and Rhythm: Normal rate and regular rhythm.     Pulses: Normal pulses.     Heart sounds: Normal heart sounds. No murmur heard.  No gallop.  Pulmonary:     Effort: Pulmonary effort is normal. No respiratory distress.     Breath sounds: Normal  breath sounds. No wheezing or rales.  Abdominal:     General: Bowel sounds are normal.     Palpations: Abdomen is soft. There is mass.     Comments: She has a long mass effect in the epigastric area measuring at least 12 to 15 cm long and hard to palpation   Musculoskeletal:        General: Normal range of motion.     Cervical back: Normal range of motion and neck supple.  Skin:    General: Skin is warm and dry.     Findings: Rash present.     Comments: Diffuse maculopapular rash of all extremities.   Neurological:     General: No focal deficit present.     Mental Status: She is alert and oriented to person, place, and time.  Psychiatric:        Mood and Affect: Mood normal.        Behavior: Behavior normal.        Thought Content: Thought content normal.        Judgment: Judgment normal.     LABS:      Latest Ref Rng & Units 02/26/2022    2:31 PM 02/16/2022   12:00 AM 01/28/2022   12:00 AM  CBC  WBC 4.0 - 10.5 K/uL 10.9  6.2     4.9      Hemoglobin 12.0 - 15.0 g/dL 13.4  13.2     13.4      Hematocrit 36.0 - 46.0 % 40.5  39     38      Platelets 150 - 400 K/uL 147  134     146         This result is from an external source.      Latest Ref Rng & Units 02/26/2022    2:31 PM 02/16/2022   10:04 AM 02/16/2022   12:00 AM  CMP  Glucose 70 - 99 mg/dL 104  93    BUN 8 - 23 mg/dL _0 Creatinine 0.44 - 1.00 mg/dL 0.92  0.84  0.8      Sodium 135 - 145 mmol/L 137  136  135      Potassium 3.5 - 5.1 mmol/L 3.8  4.0  4.0      Chloride 98 - 111 mmol/L 103  104  105      CO2 22 - 32 mmol/L _1 Calcium 8.9 - 10.3 mg/dL 9.6  9.2  9.5      Total Protein 6.5 - 8.1 g/dL 7.5  6.7    Total Bilirubin 0.3 - 1.2 mg/dL 0.6  0.8    Alkaline Phos 38 - 126 U/L 65  62  73  C     AST 15 - 41 U/L _2 ALT 0 - 44 U/L _3 C Corrected result   This result is from an external source.     Lab Results  Component Value Date   CEA1 2.9 09/10/2021   /   CEA  Date Value Ref Range Status  09/10/2021 2.9 0.0 - 4.7 ng/mL Final    Comment:    (NOTE)  Nonsmokers          <3.9                             Smokers             <5.6 [employer] Electrochemiluminescence Immunoassay (ECLIA) Values obtained with different assay methods or kits cannot be used interchangeably.  Results cannot be interpreted as absolute evidence of the presence or absence of malignant disease. Performed At: [facility] [place], [place] [id number] Rush Farmer MD AC:[id number]    No results found for: "PSA1" No results found for: "[id number]" No results found for: "CAN125"  No results found for: "TOTALPROTELP", "ALBUMINELP", "A1GS", "A2GS", "BETS", "BETA2SER", "GAMS", "MSPIKE", "SPEI" No results found for: "TIBC", "FERRITIN", "IRONPCTSAT" No results found for: "LDH"  STUDIES:  No results found.  EXAM:12/16/21 CT CHEST, ABDOMEN, AND PELVIS WITH CONTRAST IMPRESSION: Response to therapy of gastric primary and adjacent omental nodularity. No new or progressive disease. Right larger than left lower lobe pulmonary nodules, felt to be similar to the 04/10/2016 remote CT and therefore benign. Progressive interstitial lung disease, right greater than left. This could be postinfectious/inflammatory or less likely represent nonspecific interstitial pneumonitis. Improvement in common duct dilation, likely incidental given chronicity.   EXAM:09/18/2021 NUCLEAR MEDICINE PET SKULL BASE TO THIGH CLINICAL DATA:  Initial treatment strategy for gastric cancer.   EXAM: NUCLEAR MEDICINE PET SKULL BASE TO THIGH   TECHNIQUE: 9.2 mCi F-18 FDG was injected intravenously. Full-ring PET imaging was performed from the skull base to thigh after the radiotracer. CT data was obtained and used for attenuation correction and anatomic localization.   Fasting blood glucose: 124 mg/dl   COMPARISON:  MRI July 20, 2021 and CT  April 10, 2016   FINDINGS: Mediastinal blood pool activity: SUV max 2.4   Liver activity: SUV max NA   NECK: No hypermetabolic cervical adenopathy.   Symmetric hypermetabolic hyperplasia of the tonsils commonly reactive.   Incidental CT findings: none   CHEST: No hypermetabolic thoracic adenopathy.   No hypermetabolic pulmonary nodules or masses.   Incidental CT findings: Aortic atherosclerosis. Calcified mediastinal and right hilar lymph nodes. Motion degraded examination reveals no suspicious pulmonary nodules or masses. Patulous esophagus with a small hiatal hernia.   ABDOMEN/PELVIS: Evaluation of the gastric wall is limited by minimal distension, within this context: Similar diffuse nonspecific gastric wall thickening with no significant change in the appearance of the possible gastric body ulceration seen on image 117/4 and diffuse hypermetabolic activity within the stomach demonstrating a max SUV of 8.6.   Mildly metabolic nodularity anterior and inferior to the greater curvature of the stomach with the largest of which measures 22 x 13 mm on image 119/4 with a max SUV of 2.3.   No abnormal hypermetabolic activity within the liver, pancreas, adrenal glands or spleen.   No hypermetabolic abdominopelvic adenopathy.   Incidental CT findings: Similar mild intrahepatic and moderate extrahepatic biliary ductal dilation with the common duct measuring 13 mm. Colonic diverticulosis without findings of acute diverticulitis. Aortic atherosclerosis.   SKELETON: No focal hypermetabolic activity to suggest skeletal metastasis.   Incidental CT findings: Multilevel degenerative changes spine   IMPRESSION: 1. Evaluation of the gastric wall is limited by minimal distension, within this context, here is diffuse hypermetabolic activity within the stomach with similar diffuse nonspecific gastric wall thickening and no significant change in the appearance of the possible  anterior gastric body ulceration. 2.  Mildly metabolic omental nodularity anterior and inferior to the stomach, likely reflects omental disease involvement. 3. No convincing evidence of hypermetabolic metastatic disease in the neck, chest, or pelvis. 4. Similar mild intrahepatic and moderate extrahepatic biliary ductal dilation with the common duct measuring 13 mm no suspicious hypermetabolic lesion identified within the duct and no discrete lesion identified on prior MRI dated July 20, 2021. 5.  Aortic Atherosclerosis (ICD10-I70.0).        I,Gabriella Ballesteros,acting as a scribe for Derwood Kaplan, MD.,have documented all relevant documentation on the behalf of Derwood Kaplan, MD,as directed by  Derwood Kaplan, MD while in the presence of Derwood Kaplan, MD.

## 2022-01-28 ENCOUNTER — Inpatient Hospital Stay (INDEPENDENT_AMBULATORY_CARE_PROVIDER_SITE_OTHER): Payer: Medicare Other | Admitting: Oncology

## 2022-01-28 ENCOUNTER — Inpatient Hospital Stay: Payer: Medicare Other

## 2022-01-28 ENCOUNTER — Telehealth: Payer: Self-pay | Admitting: Oncology

## 2022-01-28 VITALS — BP 174/78 | HR 88 | Temp 98.3°F | Resp 18 | Ht 64.0 in | Wt 190.0 lb

## 2022-01-28 DIAGNOSIS — Z5111 Encounter for antineoplastic chemotherapy: Secondary | ICD-10-CM | POA: Diagnosis not present

## 2022-01-28 DIAGNOSIS — E876 Hypokalemia: Secondary | ICD-10-CM | POA: Diagnosis not present

## 2022-01-28 DIAGNOSIS — C169 Malignant neoplasm of stomach, unspecified: Secondary | ICD-10-CM | POA: Diagnosis not present

## 2022-01-28 DIAGNOSIS — C168 Malignant neoplasm of overlapping sites of stomach: Secondary | ICD-10-CM | POA: Diagnosis not present

## 2022-01-28 DIAGNOSIS — D649 Anemia, unspecified: Secondary | ICD-10-CM | POA: Diagnosis not present

## 2022-01-28 DIAGNOSIS — Z79899 Other long term (current) drug therapy: Secondary | ICD-10-CM | POA: Diagnosis not present

## 2022-01-28 LAB — CMP (CANCER CENTER ONLY)
ALT: 19 U/L (ref 0–44)
AST: 25 U/L (ref 15–41)
Albumin: 4.3 g/dL (ref 3.5–5.0)
Alkaline Phosphatase: 66 U/L (ref 38–126)
Anion gap: 7 (ref 5–15)
BUN: 13 mg/dL (ref 8–23)
CO2: 25 mmol/L (ref 22–32)
Calcium: 9.2 mg/dL (ref 8.9–10.3)
Chloride: 107 mmol/L (ref 98–111)
Creatinine: 1.01 mg/dL — ABNORMAL HIGH (ref 0.44–1.00)
GFR, Estimated: 55 mL/min — ABNORMAL LOW (ref >60)
Glucose, Bld: 117 mg/dL — ABNORMAL HIGH (ref 70–99)
Potassium: 3.7 mmol/L (ref 3.5–5.1)
Sodium: 139 mmol/L (ref 135–145)
Total Bilirubin: 0.5 mg/dL (ref 0.3–1.2)
Total Protein: 6.9 g/dL (ref 6.5–8.1)

## 2022-01-28 LAB — CBC AND DIFFERENTIAL
HCT: 38 (ref 36–46)
Hemoglobin: 13.4 (ref 12.0–16.0)
Neutrophils Absolute: 1.62
Platelets: 146 10*3/uL — AB (ref 150–400)
WBC: 4.9

## 2022-01-28 LAB — TSH: TSH: 1.773 u[IU]/mL (ref 0.350–4.500)

## 2022-01-28 LAB — CBC: RBC: 4.32 (ref 3.87–5.11)

## 2022-01-28 NOTE — Telephone Encounter (Signed)
01/28/22 Next appt scheduled and confirmed with patient 

## 2022-01-29 ENCOUNTER — Other Ambulatory Visit: Payer: Self-pay | Admitting: Oncology

## 2022-01-29 ENCOUNTER — Other Ambulatory Visit: Payer: Self-pay

## 2022-01-29 DIAGNOSIS — C168 Malignant neoplasm of overlapping sites of stomach: Secondary | ICD-10-CM

## 2022-01-29 MED FILL — Oxaliplatin IV Soln 100 MG/20ML: INTRAVENOUS | Qty: 20 | Status: AC

## 2022-01-29 MED FILL — Fosaprepitant Dimeglumine For IV Infusion 150 MG (Base Eq): INTRAVENOUS | Qty: 5 | Status: AC

## 2022-01-29 MED FILL — Fluorouracil IV Soln 5 GM/100ML (50 MG/ML): INTRAVENOUS | Qty: 76 | Status: AC

## 2022-01-29 MED FILL — Dexamethasone Sodium Phosphate Inj 100 MG/10ML: INTRAMUSCULAR | Qty: 1 | Status: AC

## 2022-01-29 MED FILL — Fluorouracil IV Soln 2.5 GM/50ML (50 MG/ML): INTRAVENOUS | Qty: 13 | Status: AC

## 2022-01-29 MED FILL — Leucovorin Calcium For Inj 350 MG: INTRAMUSCULAR | Qty: 31.5 | Status: AC

## 2022-01-30 LAB — T4: T4, Total: 14.6 ug/dL — ABNORMAL HIGH (ref 4.5–12.0)

## 2022-02-01 ENCOUNTER — Telehealth: Payer: Self-pay

## 2022-02-01 ENCOUNTER — Inpatient Hospital Stay: Payer: Medicare Other

## 2022-02-01 VITALS — BP 141/66 | HR 101 | Temp 98.6°F | Resp 18 | Ht 64.0 in | Wt 190.1 lb

## 2022-02-01 DIAGNOSIS — D6959 Other secondary thrombocytopenia: Secondary | ICD-10-CM | POA: Diagnosis not present

## 2022-02-01 DIAGNOSIS — R509 Fever, unspecified: Secondary | ICD-10-CM | POA: Diagnosis not present

## 2022-02-01 DIAGNOSIS — E860 Dehydration: Secondary | ICD-10-CM | POA: Diagnosis not present

## 2022-02-01 DIAGNOSIS — Z7982 Long term (current) use of aspirin: Secondary | ICD-10-CM | POA: Diagnosis not present

## 2022-02-01 DIAGNOSIS — C169 Malignant neoplasm of stomach, unspecified: Secondary | ICD-10-CM | POA: Diagnosis not present

## 2022-02-01 DIAGNOSIS — E039 Hypothyroidism, unspecified: Secondary | ICD-10-CM | POA: Diagnosis not present

## 2022-02-01 DIAGNOSIS — K219 Gastro-esophageal reflux disease without esophagitis: Secondary | ICD-10-CM | POA: Diagnosis not present

## 2022-02-01 DIAGNOSIS — R9431 Abnormal electrocardiogram [ECG] [EKG]: Secondary | ICD-10-CM | POA: Diagnosis not present

## 2022-02-01 DIAGNOSIS — Z5111 Encounter for antineoplastic chemotherapy: Secondary | ICD-10-CM | POA: Diagnosis not present

## 2022-02-01 DIAGNOSIS — Z7952 Long term (current) use of systemic steroids: Secondary | ICD-10-CM | POA: Diagnosis not present

## 2022-02-01 DIAGNOSIS — N3001 Acute cystitis with hematuria: Secondary | ICD-10-CM | POA: Diagnosis not present

## 2022-02-01 DIAGNOSIS — L271 Localized skin eruption due to drugs and medicaments taken internally: Secondary | ICD-10-CM | POA: Diagnosis not present

## 2022-02-01 DIAGNOSIS — A419 Sepsis, unspecified organism: Secondary | ICD-10-CM | POA: Diagnosis not present

## 2022-02-01 DIAGNOSIS — Z79899 Other long term (current) drug therapy: Secondary | ICD-10-CM | POA: Diagnosis not present

## 2022-02-01 DIAGNOSIS — Z882 Allergy status to sulfonamides status: Secondary | ICD-10-CM | POA: Diagnosis not present

## 2022-02-01 DIAGNOSIS — M1990 Unspecified osteoarthritis, unspecified site: Secondary | ICD-10-CM | POA: Diagnosis not present

## 2022-02-01 DIAGNOSIS — J159 Unspecified bacterial pneumonia: Secondary | ICD-10-CM | POA: Diagnosis not present

## 2022-02-01 DIAGNOSIS — J189 Pneumonia, unspecified organism: Secondary | ICD-10-CM | POA: Diagnosis not present

## 2022-02-01 DIAGNOSIS — E871 Hypo-osmolality and hyponatremia: Secondary | ICD-10-CM | POA: Diagnosis not present

## 2022-02-01 DIAGNOSIS — J984 Other disorders of lung: Secondary | ICD-10-CM | POA: Diagnosis not present

## 2022-02-01 DIAGNOSIS — I10 Essential (primary) hypertension: Secondary | ICD-10-CM | POA: Diagnosis not present

## 2022-02-01 DIAGNOSIS — Z792 Long term (current) use of antibiotics: Secondary | ICD-10-CM | POA: Diagnosis not present

## 2022-02-01 DIAGNOSIS — R918 Other nonspecific abnormal finding of lung field: Secondary | ICD-10-CM | POA: Diagnosis not present

## 2022-02-01 DIAGNOSIS — T364X5A Adverse effect of tetracyclines, initial encounter: Secondary | ICD-10-CM | POA: Diagnosis not present

## 2022-02-01 DIAGNOSIS — E785 Hyperlipidemia, unspecified: Secondary | ICD-10-CM | POA: Diagnosis not present

## 2022-02-01 DIAGNOSIS — E872 Acidosis, unspecified: Secondary | ICD-10-CM | POA: Diagnosis not present

## 2022-02-01 DIAGNOSIS — C168 Malignant neoplasm of overlapping sites of stomach: Secondary | ICD-10-CM

## 2022-02-01 DIAGNOSIS — Z1152 Encounter for screening for COVID-19: Secondary | ICD-10-CM | POA: Diagnosis not present

## 2022-02-01 DIAGNOSIS — T451X5A Adverse effect of antineoplastic and immunosuppressive drugs, initial encounter: Secondary | ICD-10-CM | POA: Diagnosis not present

## 2022-02-01 DIAGNOSIS — E8729 Other acidosis: Secondary | ICD-10-CM | POA: Diagnosis not present

## 2022-02-01 MED ORDER — SODIUM CHLORIDE 0.9 % IV SOLN
150.0000 mg | Freq: Once | INTRAVENOUS | Status: AC
  Administered 2022-02-01: 150 mg via INTRAVENOUS
  Filled 2022-02-01: qty 150

## 2022-02-01 MED ORDER — SODIUM CHLORIDE 0.9 % IV SOLN
1920.0000 mg/m2 | INTRAVENOUS | Status: DC
  Administered 2022-02-01: 3800 mg via INTRAVENOUS
  Filled 2022-02-01: qty 76

## 2022-02-01 MED ORDER — FLUOROURACIL CHEMO INJECTION 2.5 GM/50ML
320.0000 mg/m2 | Freq: Once | INTRAVENOUS | Status: AC
  Administered 2022-02-01: 650 mg via INTRAVENOUS
  Filled 2022-02-01: qty 13

## 2022-02-01 MED ORDER — DEXTROSE 5 % IV SOLN: Freq: Once | INTRAVENOUS | Status: AC

## 2022-02-01 MED ORDER — SODIUM CHLORIDE 0.9 % IV SOLN
10.0000 mg | Freq: Once | INTRAVENOUS | Status: AC
  Administered 2022-02-01: 10 mg via INTRAVENOUS
  Filled 2022-02-01: qty 10

## 2022-02-01 MED ORDER — PALONOSETRON HCL INJECTION 0.25 MG/5ML
0.2500 mg | Freq: Once | INTRAVENOUS | Status: AC
  Administered 2022-02-01: 0.25 mg via INTRAVENOUS
  Filled 2022-02-01: qty 5

## 2022-02-01 MED ORDER — OXALIPLATIN CHEMO INJECTION 100 MG/20ML
51.0000 mg/m2 | Freq: Once | INTRAVENOUS | Status: AC
  Administered 2022-02-01: 100 mg via INTRAVENOUS
  Filled 2022-02-01: qty 20

## 2022-02-01 MED ORDER — LEUCOVORIN CALCIUM INJECTION 350 MG
320.0000 mg/m2 | Freq: Once | INTRAVENOUS | Status: AC
  Administered 2022-02-01: 630 mg via INTRAVENOUS
  Filled 2022-02-01: qty 31.5

## 2022-02-01 NOTE — Patient Instructions (Signed)
[facility]  Discharge Instructions: Thank you for choosing [facility] to provide your oncology and hematology care.  If you have a lab appointment with the [facility], please go directly to the [facility] and check in at the registration area.   Wear comfortable clothing and clothing appropriate for easy access to any Portacath or PICC line.   We strive to give you quality time with your provider. You may need to reschedule your appointment if you arrive late (15 or more minutes).  Arriving late affects you and other patients whose appointments are after yours.  Also, if you miss three or more appointments without notifying the office, you may be dismissed from the clinic at the provider's discretion.      For prescription refill requests, have your pharmacy contact our office and allow 72 hours for refills to be completed.    Today you received the following chemotherapy and/or immunotherapy agents  Leucovorin Injection What is this medication? LEUCOVORIN (loo koe VOR in) prevents side effects from certain medications, such as methotrexate. It works by increasing folate levels. This helps protect healthy cells in your body. It may also be used to treat anemia caused by low levels of folate. It can also be used with fluorouracil, a type of chemotherapy, to treat colorectal cancer. It works by increasing the effects of fluorouracil in the body. This medicine may be used for other purposes; ask your health care provider or pharmacist if you have questions. What should I tell my care team before I take this medication? They need to know if you have any of these conditions: Anemia from low levels of vitamin B12 in the blood An unusual or allergic reaction to leucovorin, folic acid, other medications, foods, dyes, or preservatives Pregnant or trying to get pregnant Breastfeeding How should I use this medication? This medication is injected into a vein  or a muscle. It is given by your care team in a hospital or clinic setting. Talk to your care team about the use of this medication in children. Special care may be needed. Overdosage: If you think you have taken too much of this medicine contact a poison control center or emergency room at once. NOTE: This medicine is only for you. Do not share this medicine with others. What if I miss a dose? Keep appointments for follow-up doses. It is important not to miss your dose. Call your care team if you are unable to keep an appointment. What may interact with this medication? Capecitabine Fluorouracil Phenobarbital Phenytoin Primidone Trimethoprim;sulfamethoxazole This list may not describe all possible interactions. Give your health care provider a list of all the medicines, herbs, non-prescription drugs, or dietary supplements you use. Also tell them if you smoke, drink alcohol, or use illegal drugs. Some items may interact with your medicine. What should I watch for while using this medication? Your condition will be monitored carefully while you are receiving this medication. This medication may increase the side effects of 5-fluorouracil. Tell your care team if you have diarrhea or mouth sores that do not get better or that get worse. What side effects may I notice from receiving this medication? Side effects that you should report to your care team as soon as possible: Allergic reactions--skin rash, itching, hives, swelling of the face, lips, tongue, or throat This list may not describe all possible side effects. Call your doctor for medical advice about side effects. You may report side effects to FDA  at 1-800-FDA-1088. Where should I keep my medication? This medication is given in a hospital or clinic. It will not be stored at home. NOTE: This sheet is a summary. It may not cover all possible information. If you have questions about this medicine, talk to your doctor, pharmacist, or health  care provider.  2023 Elsevier/Gold Standard (2021-07-07 00:00:00)     To help prevent nausea and vomiting after your treatment, we encourage you to take your nausea medication as directed.  BELOW ARE SYMPTOMS THAT SHOULD BE REPORTED IMMEDIATELY: *FEVER GREATER THAN 100.4 F (38 C) OR HIGHER *CHILLS OR SWEATING *NAUSEA AND VOMITING THAT IS NOT CONTROLLED WITH YOUR NAUSEA MEDICATION *UNUSUAL SHORTNESS OF BREATH *UNUSUAL BRUISING OR BLEEDING *URINARY PROBLEMS (pain or burning when urinating, or frequent urination) *BOWEL PROBLEMS (unusual diarrhea, constipation, pain near the anus) TENDERNESS IN MOUTH AND THROAT WITH OR WITHOUT PRESENCE OF ULCERS (sore throat, sores in mouth, or a toothache) UNUSUAL RASH, SWELLING OR PAIN  UNUSUAL VAGINAL DISCHARGE OR ITCHING   Items with * indicate a potential emergency and should be followed up as soon as possible or go to the Emergency Department if any problems should occur.  Please show the CHEMOTHERAPY ALERT CARD or IMMUNOTHERAPY ALERT CARD at check-in to the Emergency Department and triage nurse.  Should you have questions after your visit or need to cancel or reschedule your appointment, please contact [facility]  Dept: [phone]  and follow the prompts.  Office hours are 8:00 a.m. to 4:30 p.m. Monday - Friday. Please note that voicemails left after 4:00 p.m. may not be returned until the following business day.  We are closed weekends and major holidays. You have access to a nurse at all times for urgent questions. Please call the main number to the clinic Dept: [phone] and follow the prompts.  For any non-urgent questions, you may also contact your provider using MyChart. We now offer e-Visits for anyone 52 and older to request care online for non-urgent symptoms. For details visit [email or web address].   Also download the MyChart app! Go to the app store, search "MyChart", open the app, select [facility], and  log in with your MyChart username and password.  Masks are optional in the cancer centers. If you would like for your care team to wear a mask while they are taking care of you, please let them know. You may have one support person who is at least 84 years old accompany you for your appointments. Oxaliplatin Injection What is this medication? OXALIPLATIN (ox AL i PLA tin) treats some types of cancer. It works by slowing down the growth of cancer cells. This medicine may be used for other purposes; ask your health care provider or pharmacist if you have questions. COMMON BRAND NAME(S): Eloxatin What should I tell my care team before I take this medication? They need to know if you have any of these conditions: Heart disease History of irregular heartbeat or rhythm Liver disease Low blood cell levels (white cells, red cells, and platelets) Lung or breathing disease, such as asthma Take medications that treat or prevent blood clots Tingling of the fingers, toes, or other nerve disorder An unusual or allergic reaction to oxaliplatin, other medications, foods, dyes, or preservatives If you or your partner are pregnant or trying to get pregnant Breast-feeding How should I use this medication? This medication is injected into a vein. It is given by your care team in a hospital or clinic setting. Talk to your  care team about the use of this medication in children. Special care may be needed. Overdosage: If you think you have taken too much of this medicine contact a poison control center or emergency room at once. NOTE: This medicine is only for you. Do not share this medicine with others. What if I miss a dose? Keep appointments for follow-up doses. It is important not to miss a dose. Call your care team if you are unable to keep an appointment. What may interact with this medication? Do not take this medication with any of the following: Cisapride Dronedarone Pimozide Thioridazine This  medication may also interact with the following: Aspirin and aspirin-like medications Certain medications that treat or prevent blood clots, such as warfarin, apixaban, dabigatran, and rivaroxaban Cisplatin Cyclosporine Diuretics Medications for infection, such as acyclovir, adefovir, amphotericin B, bacitracin, cidofovir, foscarnet, ganciclovir, gentamicin, pentamidine, vancomycin NSAIDs, medications for pain and inflammation, such as ibuprofen or naproxen Other medications that cause heart rhythm changes Pamidronate Zoledronic acid This list may not describe all possible interactions. Give your health care provider a list of all the medicines, herbs, non-prescription drugs, or dietary supplements you use. Also tell them if you smoke, drink alcohol, or use illegal drugs. Some items may interact with your medicine. What should I watch for while using this medication? Your condition will be monitored carefully while you are receiving this medication. You may need blood work while taking this medication. This medication may make you feel generally unwell. This is not uncommon as chemotherapy can affect healthy cells as well as cancer cells. Report any side effects. Continue your course of treatment even though you feel ill unless your care team tells you to stop. This medication may increase your risk of getting an infection. Call your care team for advice if you get a fever, chills, sore throat, or other symptoms of a cold or flu. Do not treat yourself. Try to avoid being around people who are sick. Avoid taking medications that contain aspirin, acetaminophen, ibuprofen, naproxen, or ketoprofen unless instructed by your care team. These medications may hide a fever. Be careful brushing or flossing your teeth or using a toothpick because you may get an infection or bleed more easily. If you have any dental work done, tell your dentist you are receiving this medication. This medication can make you  more sensitive to cold. Do not drink cold drinks or use ice. Cover exposed skin before coming in contact with cold temperatures or cold objects. When out in cold weather wear warm clothing and cover your mouth and nose to warm the air that goes into your lungs. Tell your care team if you get sensitive to the cold. Talk to your care team if you or your partner are pregnant or think either of you might be pregnant. This medication can cause serious birth defects if taken during pregnancy and for 9 months after the last dose. A negative pregnancy test is required before starting this medication. A reliable form of contraception is recommended while taking this medication and for 9 months after the last dose. Talk to your care team about effective forms of contraception. Do not father a child while taking this medication and for 6 months after the last dose. Use a condom while having sex during this time period. Do not breastfeed while taking this medication and for 3 months after the last dose. This medication may cause infertility. Talk to your care team if you are concerned about your fertility. What side effects  may I notice from receiving this medication? Side effects that you should report to your care team as soon as possible: Allergic reactions--skin rash, itching, hives, swelling of the face, lips, tongue, or throat Bleeding--bloody or black, tar-like stools, vomiting blood or brown material that looks like coffee grounds, red or dark brown urine, small red or purple spots on skin, unusual bruising or bleeding Dry cough, shortness of breath or trouble breathing Heart rhythm changes--fast or irregular heartbeat, dizziness, feeling faint or lightheaded, chest pain, trouble breathing Infection--fever, chills, cough, sore throat, wounds that don't heal, pain or trouble when passing urine, general feeling of discomfort or being unwell Liver injury--right upper belly pain, loss of appetite, nausea,  light-colored stool, dark yellow or brown urine, yellowing skin or eyes, unusual weakness or fatigue Low red blood cell level--unusual weakness or fatigue, dizziness, headache, trouble breathing Muscle injury--unusual weakness or fatigue, muscle pain, dark yellow or brown urine, decrease in amount of urine Pain, tingling, or numbness in the hands or feet Sudden and severe headache, confusion, change in vision, seizures, which may be signs of posterior reversible encephalopathy syndrome (PRES) Unusual bruising or bleeding Side effects that usually do not require medical attention (report to your care team if they continue or are bothersome): Diarrhea Nausea Pain, redness, or swelling with sores inside the mouth or throat Unusual weakness or fatigue Vomiting This list may not describe all possible side effects. Call your doctor for medical advice about side effects. You may report side effects to FDA at 1-800-FDA-1088. Where should I keep my medication? This medication is given in a hospital or clinic. It will not be stored at home. NOTE: This sheet is a summary. It may not cover all possible information. If you have questions about this medicine, talk to your doctor, pharmacist, or health care provider.  2023 Elsevier/Gold Standard (2007-03-25 00:00:00) Fluorouracil Injection What is this medication? FLUOROURACIL (flure oh YOOR a sil) treats some types of cancer. It works by slowing down the growth of cancer cells. This medicine may be used for other purposes; ask your health care provider or pharmacist if you have questions. COMMON BRAND NAME(S): Adrucil What should I tell my care team before I take this medication? They need to know if you have any of these conditions: Blood disorders Dihydropyrimidine dehydrogenase (DPD) deficiency Infection, such as chickenpox, cold sores, herpes Kidney disease Liver disease Poor nutrition Recent or ongoing radiation therapy An unusual or allergic  reaction to fluorouracil, other medications, foods, dyes, or preservatives If you or your partner are pregnant or trying to get pregnant Breast-feeding How should I use this medication? This medication is injected into a vein. It is administered by your care team in a hospital or clinic setting. Talk to your care team about the use of this medication in children. Special care may be needed. Overdosage: If you think you have taken too much of this medicine contact a poison control center or emergency room at once. NOTE: This medicine is only for you. Do not share this medicine with others. What if I miss a dose? Keep appointments for follow-up doses. It is important not to miss your dose. Call your care team if you are unable to keep an appointment. What may interact with this medication? Do not take this medication with any of the following: Live virus vaccines This medication may also interact with the following: Medications that treat or prevent blood clots, such as warfarin, enoxaparin, dalteparin This list may not describe all possible interactions.  Give your health care provider a list of all the medicines, herbs, non-prescription drugs, or dietary supplements you use. Also tell them if you smoke, drink alcohol, or use illegal drugs. Some items may interact with your medicine. What should I watch for while using this medication? Your condition will be monitored carefully while you are receiving this medication. This medication may make you feel generally unwell. This is not uncommon as chemotherapy can affect healthy cells as well as cancer cells. Report any side effects. Continue your course of treatment even though you feel ill unless your care team tells you to stop. In some cases, you may be given additional medications to help with side effects. Follow all directions for their use. This medication may increase your risk of getting an infection. Call your care team for advice if you get  a fever, chills, sore throat, or other symptoms of a cold or flu. Do not treat yourself. Try to avoid being around people who are sick. This medication may increase your risk to bruise or bleed. Call your care team if you notice any unusual bleeding. Be careful brushing or flossing your teeth or using a toothpick because you may get an infection or bleed more easily. If you have any dental work done, tell your dentist you are receiving this medication. Avoid taking medications that contain aspirin, acetaminophen, ibuprofen, naproxen, or ketoprofen unless instructed by your care team. These medications may hide a fever. Do not treat diarrhea with over the counter products. Contact your care team if you have diarrhea that lasts more than 2 days or if it is severe and watery. This medication can make you more sensitive to the sun. Keep out of the sun. If you cannot avoid being in the sun, wear protective clothing and sunscreen. Do not use sun lamps, tanning beds, or tanning booths. Talk to your care team if you or your partner wish to become pregnant or think you might be pregnant. This medication can cause serious birth defects if taken during pregnancy and for 3 months after the last dose. A reliable form of contraception is recommended while taking this medication and for 3 months after the last dose. Talk to your care team about effective forms of contraception. Do not father a child while taking this medication and for 3 months after the last dose. Use a condom while having sex during this time period. Do not breastfeed while taking this medication. This medication may cause infertility. Talk to your care team if you are concerned about your fertility. What side effects may I notice from receiving this medication? Side effects that you should report to your care team as soon as possible: Allergic reactions--skin rash, itching, hives, swelling of the face, lips, tongue, or throat Heart attack--pain or  tightness in the chest, shoulders, arms, or jaw, nausea, shortness of breath, cold or clammy skin, feeling faint or lightheaded Heart failure--shortness of breath, swelling of the ankles, feet, or hands, sudden weight gain, unusual weakness or fatigue Heart rhythm changes--fast or irregular heartbeat, dizziness, feeling faint or lightheaded, chest pain, trouble breathing High ammonia level--unusual weakness or fatigue, confusion, loss of appetite, nausea, vomiting, seizures Infection--fever, chills, cough, sore throat, wounds that don't heal, pain or trouble when passing urine, general feeling of discomfort or being unwell Low red blood cell level--unusual weakness or fatigue, dizziness, headache, trouble breathing Pain, tingling, or numbness in the hands or feet, muscle weakness, change in vision, confusion or trouble speaking, loss of balance or coordination,  trouble walking, seizures Redness, swelling, and blistering of the skin over hands and feet Severe or prolonged diarrhea Unusual bruising or bleeding Side effects that usually do not require medical attention (report to your care team if they continue or are bothersome): Dry skin Headache Increased tears Nausea Pain, redness, or swelling with sores inside the mouth or throat Sensitivity to light Vomiting This list may not describe all possible side effects. Call your doctor for medical advice about side effects. You may report side effects to FDA at 1-800-FDA-1088. Where should I keep my medication? This medication is given in a hospital or clinic. It will not be stored at home. NOTE: This sheet is a summary. It may not cover all possible information. If you have questions about this medicine, talk to your doctor, pharmacist, or health care provider.  2023 Elsevier/Gold Standard (2021-06-02 00:00:00)

## 2022-02-01 NOTE — Telephone Encounter (Signed)
Pt called to ask if she could just take tylenol for a fever of 101? She doesn't want to go to ER. She mentioned her pulse is 124 as well. I notified Dr Bobby Rumpf of above. He recommends she go to ER.

## 2022-02-02 NOTE — Progress Notes (Signed)
02/02/22 1415 will scan in photos of patients rash to her chart. Patient states rash has intensified and spread since she got home yesterday.

## 2022-02-02 NOTE — Progress Notes (Signed)
02/02/22   1415. Removed chemo pump and flushed patients port at [facility] where she was admitted. Notified Kelli, [place] and she gave the order to stop her pump since she is not on a chemo floor and receiving antibiotics for sepsis. Reservoir volume is 76.0 and Given is 79.05.

## 2022-02-03 ENCOUNTER — Inpatient Hospital Stay: Payer: Medicare Other

## 2022-02-10 DIAGNOSIS — D485 Neoplasm of uncertain behavior of skin: Secondary | ICD-10-CM | POA: Diagnosis not present

## 2022-02-10 DIAGNOSIS — L270 Generalized skin eruption due to drugs and medicaments taken internally: Secondary | ICD-10-CM | POA: Diagnosis not present

## 2022-02-10 DIAGNOSIS — L309 Dermatitis, unspecified: Secondary | ICD-10-CM | POA: Diagnosis not present

## 2022-02-10 DIAGNOSIS — L299 Pruritus, unspecified: Secondary | ICD-10-CM | POA: Diagnosis not present

## 2022-02-15 NOTE — Progress Notes (Signed)
[facility]  [address] [place],  [place]  [address] [phone]  Clinic Day: 02/15/22   Referring physician: Ernestene Kiel, MD   ASSESSMENT & PLAN:   Gastric adenocarcinoma This is a poorly differentiated adenocarcinoma with signet ring features and ulceration.  I would consider it as metastatic to the omentum and so that would stage this as a T4 N0 M1, stage IVB.  PET scan did not show evidence of metastatic disease other than the omentum. She is on treatment with chemotherapy of FOLFOX and immunotherapy with nivolumab to improve the success rate. Stain for HER2 was negative at 0. She was tolerating treatment well other than mild nausea and mouth sores, but she now has significant dermatologic toxicity.  This may be from both the immunotherapy and the chemotherapy since it occurred even when we held the immunotherapy last time.  We will cancel her infusion for this week and probably stop this treatment altogether.   Plan:.I will cancel her treatment for this week and most likely stop the FOLFOX and nivolumab altogether.  We tried the FOLFOX by itself and she ended up with fever and new rash, although she also was found to have sepsis, pneumonia and a UTI.  The blotchy rash of her lower extremities still has not cleared and so I am hesitant to repeat a trial of the immunotherapy.  [name] back in 2 weeks with CBC and CMP to see how she is getting along and evaluate her skin.  We will then plan a PET scan in February and later an upper endoscopy with Dr. Lyda Jester.  If she needs further treatment, we may need to consider an alternative regimen.  She is doing well. I reviewed all of this with her and her family and answered their questions. She understands and agrees with this plan.  I provided 20 minutes of face-to-face time during this this encounter and > 50% was spent counseling as documented under my assessment and plan.    Mackenzie Kaplan, MD [facility] [address] [place] [address] Dept: [phone] Dept Fax: [phone]   CHIEF COMPLAINT:  CC: Gastric cancer  Current Treatment: Chemotherapy/immunotherapy   HISTORY OF PRESENT ILLNESS:  Mackenzie Lewis is a 87 y.o. female with a history of gastric cancer who is referred in consultation with Dr. Sandria Senter for assessment and management.  She had noticed that she was having regurgitation when eating and had lost over 30 pounds.  An ultrasound was done, revealing hepatic steatosis, and led to an MRI scan on June 5 which revealed gastric wall thickening with confluent nodularity of the omentum anterior to the stomach measuring 3.5 cm consistent with metastatic tumor.  She also had low-grade edema and wall thickening extending into the duodenum from the stomach.  She was referred to Dr. Sandria Senter and he did an EGD on July 7.  This revealed a large ulceration measuring 1.2 cm along the greater curvature.  She also had diffusely edematous and erythematous wall with erosions of the antrum and stiff and friable mucosa with oozing of blood.  These findings extended to the gastric fundus as well.  Pathology revealed a poorly differentiated adenocarcinoma with signet ring features from the biopsies of the ulcer as well as the antrum and the fundus of the stomach.  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  She was placed  on omeprazole.  Her test for H. pylori was negative.  She was referred to Dr. Kendell Bane for consideration of surgery but he felt this was not resectable because of the extensive involvement and I agree.  I would consider this extending to the duodenum and also metastatic to the omentum.  PET scan confirmed these findings and she wished to pursue systemic intravenous therapy.  She continues to eat and has adjusted her diet to softer foods  and liquids and is drinking boost so she has maintained her weight.  She does have some Zofran ODT for nausea when needed.  She has had some anorexia, nausea, occasional vomiting, early satiety, dysphagia, and belching.  A CEA and CA 19-9 were normal. She has been started on FOLFOX chemotherapy along with immunotherapy.  INTERVAL HISTORY:  I have reviewed her chart and materials related to her cancer extensively and collaborated history with the patient. Summary of oncologic history is as follows: Oncology History  Gastric cancer ([place])  09/10/2021 Initial Diagnosis   Gastric cancer ([place])   09/10/2021 Cancer Staging   Staging form: Stomach, AJCC 8th Edition - Clinical stage from 09/10/2021: Stage IVB (cT4b, cN0, cM1) - Signed by Mackenzie Kaplan, MD on 09/10/2021 Histopathologic type: Adenocarcinoma, NOS Stage prefix: Initial diagnosis Total positive nodes: 0 Histologic grade (G): G3 Histologic grading system: 3 grade system Sites of metastasis: Peritoneal surface Diagnostic confirmation: Positive histology PLUS positive immunophenotyping and/or positive genetic studies Specimen type: Endoscopy with Biopsy Staged by: Managing physician Carcinoembryonic antigen (CEA) (ng/mL): 2.8 Carbohydrate antigen 19-9 (CA 19-9) (U/mL): 4.9 HER2 status: Unknown Microsatellite instability (MSI): Unknown Tumor location in stomach: Other Clinical staging modalities: Biopsy, Endoscopy Stage used in treatment planning: Yes National guidelines used in treatment planning: Yes Type of national guideline used in treatment planning: NCCN   09/28/2021 - 10/14/2021 Chemotherapy   Patient is on Treatment Plan : GASTROESOPHAGEAL FOLFOX + Nivolumab q14d     09/28/2021 -  Chemotherapy   Patient is on Treatment Plan : GASTROESOPHAGEAL FOLFOX + Nivolumab q14d     12/09/2021 Genetic Testing   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  Report date is 12/09/2021.   The  Multi-Cancer + RNA Panel offered by [facility] includes sequencing and/or deletion/duplication analysis of the following 84 genes:  AIP*, ALK, APC*, ATM*, AXIN2*, BAP1*, BARD1*, BLM*, BMPR1A*, BRCA1*, BRCA2*, BRIP1*, CASR, CDC73*, CDH1*, CDK4, CDKN1B*, CDKN1C*, CDKN2A, CEBPA, CHEK2*, CTNNA1*, DICER1*, DIS3L2*, EGFR, EPCAM, FH*, FLCN*, GATA2*, GPC3, GREM1, HOXB13, HRAS, KIT, MAX*, MEN1*, MET, MITF, MLH1*, MSH2*, MSH3*, MSH6*, MUTYH*, NBN*, NF1*, NF2*, NTHL1*, PALB2*, PDGFRA, PHOX2B, PMS2*, POLD1*, POLE*, POT1*, PRKAR1A*, PTCH1*, PTEN*, RAD50*, RAD51C*, RAD51D*, RB1*, RECQL4, RET, RUNX1*, SDHA*, SDHAF2*, SDHB*, SDHC*, SDHD*, SMAD4*, SMARCA4*, SMARCB1*, SMARCE1*, STK11*, SUFU*, TERC, TERT, TMEM127*, Tp53*, TSC1*, TSC2*, VHL*, WRN*, and WT1.  RNA analysis is performed for * genes.    INTERVAL HISTORY Mackenzie Lewis is seen in the clinic for follow up of her gastric cancer.  She had her last cycle of FOLFOX chemotherapy on December 18 but we held the nivolumab due to the persistent dermatologic toxicity.  It has been very confusing to sort out what is the cause and she appears to even have 2 different types of skin rash.  She did well with the chemotherapy that day but then that evening developed a high fever and pruritic rash of the chest back and upper extremities which was different than the persistent blotchy type rash that she has had of her lower extremities for  many weeks.  She was admitted to the hospital and found to have sepsis and a urinary tract infection as well as pneumonia and so was placed on broad-spectrum IV antibiotics and discharged on Levaquin for 5 more days as well as prednisone with a taper over 4 days.  She is requesting a refill of the triamcinolone and I will be glad to do that.  She is also on a probiotic twice daily.  They did perform a biopsy of her back and this does show a reactive skin reaction consistent with drug effect.  The question is which drug.  She plans on going to the beach to see her  granddaughter on the 26th/27th for about a week and I will cancel her treatment for this week. CBC and CMP are normal.  We will put all treatment on hold and most likely will stop the FOLFOX and the nivolumab.  We could consider a repeat trial of the nivolumab but I would want to see if her rash will clear first.  She will enjoy her vacation and [name] back in 2 weeks with CBC and CMP.  I will plan a PET scan in February and later endoscopy with Dr. Lyda Jester to really see you what her responses.  If she still has significant residual disease we may want to consider an alternative chemotherapy regimen.  She denies fever, chills, night sweats, or other signs of infection. She denies cardiorespiratory and gastrointestinal issues. She  denies pain. Her appetite is good.  She has gained 2 pounds since her last visit.    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  If that happens, she is to call us so that we can schedule her for an ultrasound to look for synovitis.     Essential hypertension 09/09/2016   Gastric cancer ([place]) 09/10/2021   Hyperlipidemia 09/01/2016   Hypertension    Hypothyroidism 09/01/2016   Osteoarthritis of both feet 01/20/2016   Osteoarthritis, hand 01/20/2016   Thyroid disease   Degenerative disc disease History of endometriosis  Past Surgical History:  Procedure Laterality Date   APPENDECTOMY     LAPAROSCOPIC APPENDECTOMY N/A 04/10/2016    Procedure: APPENDECTOMY LAPAROSCOPIC;  Surgeon: Coralie Keens, MD;  Location: [facility];  Service: General;  Laterality: N/A;  Bilateral tubal ligation Total hysterectomy and bilateral salpingo-oophorectomy in 1980  Family History  Problem Relation Age of Onset   Hypertension Mother    Prostate cancer Father        metastatic; d. 78   Brain cancer Sister 54   Breast cancer Sister 82   AAA (abdominal aortic aneurysm) Brother    Leukemia Cousin        x2 maternal female cousins; d. before 95   Breast cancer Daughter 80       DCIS  Her sister has had breast cancer as well as a tumor of her head Her daughter has had breast cancer last year  Social History:  reports  that she has never smoked. She has never used smokeless tobacco. She reports that she does not currently use alcohol. She reports that she does not use drugs.The patient is accompanied by her son, daughter-in-law and daughter today.  She is single but lives with a significant other.  She has the 2 children.  She worked in [employer] (medical) and denies any chemical or toxin exposures.  She grew up in [place] and [place].  She is active and healthy, especially for her age.  Allergies:  Allergies  Allergen Reactions   Doxycycline Rash   Sulfa Antibiotics Rash    Other reaction(s): Other (See Comments) "Made me feel weird"    Current Medications: Current Outpatient Medications  Medication Sig Dispense Refill   aspirin 81 MG EC tablet Take 81 mg by mouth daily. Swallow whole.     Calcium Carbonate (CALCIUM 600 PO) Take 1 tablet by mouth daily.     Cholecalciferol (VITAMIN D3) 5000 units CAPS Take 1 capsule by mouth daily.     EXFORGE HCT 5-160-12.5 MG TABS Take 1 tablet by mouth daily.     famotidine (PEPCID) 40 MG tablet Take 40 mg by mouth at bedtime.     KRILL OIL PO Take 1 capsule by mouth daily. Unknown strenght     levothyroxine (SYNTHROID, LEVOTHROID) 75 MCG tablet Take 75 mcg by mouth daily before breakfast.      LORazepam  (ATIVAN) 0.5 MG tablet Take 1 tablet (0.5 mg total) by mouth at bedtime. 30 tablet 0   NON FORMULARY MMW: 3 parts Maalox 2 parts Benadryl 1 part viscious lidicaine  Disp. 6oz  Instructions: 39m swish and swallow every 3-4 hours     omeprazole (PRILOSEC) 40 MG capsule Take 1 capsule (40 mg total) by mouth 2 (two) times daily. 60 capsule 5   ondansetron (ZOFRAN) 4 MG tablet Take 4 mg by mouth every 4 (four) hours as needed.     ondansetron (ZOFRAN-ODT) 4 MG disintegrating tablet Take 1 tablet (4 mg total) by mouth every 8 (eight) hours as needed for nausea or vomiting. 90 tablet 0   polyethylene glycol powder (GLYCOLAX/MIRALAX) 17 GM/SCOOP powder SMARTSIG:1 scoopful By Mouth Daily     potassium chloride SA (KLOR-CON M) 20 MEQ tablet Take 1 tablet (20 mEq total) by mouth daily. 30 tablet 5   pravastatin (PRAVACHOL) 20 MG tablet Take 1 tablet (20 mg total) by mouth every evening. 90 tablet 3   Probiotic Product (PROBIOTIC DAILY PO) Take 1 tablet by mouth daily.     prochlorperazine (COMPAZINE) 10 MG tablet Take 1 tablet (10 mg total) by mouth every 6 (six) hours as needed for nausea or vomiting. 90 tablet 3   No current facility-administered medications for this visit.    REVIEW OF SYSTEMS:  Review of Systems  Constitutional: Negative.   HENT:  Negative.    Eyes: Negative.   Respiratory: Negative.    Cardiovascular: Negative.   Gastrointestinal:  Positive for constipation and nausea.       She does have some dysphagia and regurgitation with certain foods.  She has to eat very slowly.  Genitourinary: Negative.    Musculoskeletal: Negative.   Skin:  Positive for itching and rash.       Diffuse maculopapular rash of all extremities.   Neurological: Negative.   Hematological: Negative.   Psychiatric/Behavioral: Negative.        VITALS:  There were no vitals taken for this visit.  Wt Readings from Last 3 Encounters:  02/01/22  190 lb 1.9 oz (86.2 kg)  01/28/22 190 lb (86.2 kg)   01/18/22 192 lb (87.1 kg)    There is no height or weight on file to calculate BMI.  Performance status (ECOG): 1 - Symptomatic but completely ambulatory  PHYSICAL EXAM:  Physical Exam Constitutional:      General: She is not in acute distress.    Appearance: Normal appearance. She is not ill-appearing or toxic-appearing.  HENT:     Head: Normocephalic and atraumatic.     Nose: Nose normal.     Mouth/Throat:     Pharynx: Oropharynx is clear.  Eyes:     General:        Right eye: No discharge.        Left eye: No discharge.     Extraocular Movements: Extraocular movements intact.     Conjunctiva/sclera: Conjunctivae normal.     Pupils: Pupils are equal, round, and reactive to light.  Cardiovascular:     Rate and Rhythm: Normal rate and regular rhythm.     Pulses: Normal pulses.     Heart sounds: Normal heart sounds. No murmur heard.    No gallop.  Pulmonary:     Effort: Pulmonary effort is normal. No respiratory distress.     Breath sounds: Normal breath sounds. No wheezing or rales.  Abdominal:     General: Bowel sounds are normal.     Palpations: Abdomen is soft. There is mass.     Comments: She has a long mass effect in the epigastric area measuring at least 12 to 15 cm long and hard to palpation   Musculoskeletal:        General: Normal range of motion.     Cervical back: Normal range of motion and neck supple.  Skin:    General: Skin is warm and dry.     Findings: Rash present.     Comments: Diffuse maculopapular rash of all extremities.   Neurological:     General: No focal deficit present.     Mental Status: She is alert and oriented to person, place, and time.  Psychiatric:        Mood and Affect: Mood normal.        Behavior: Behavior normal.        Thought Content: Thought content normal.        Judgment: Judgment normal.     LABS:      Latest Ref Rng & Units 01/28/2022   12:00 AM 01/14/2022    9:38 AM 12/31/2021   12:00 AM  CBC  WBC  4.9      5.3  5.6      Hemoglobin 12.0 - 16.0 13.4     12.8  13.2      Hematocrit 36 - 46 38     37.7  39      Platelets 150 - 400 K/uL 146     147  137         This result is from an external source.       Latest Ref Rng & Units 01/28/2022   10:31 AM 01/14/2022    9:38 AM 12/31/2021    9:09 AM  CMP  Glucose 70 - 99 mg/dL 117  127  152   BUN 8 - 23 mg/dL _0 Creatinine 0.44 - 1.00 mg/dL 1.01  1.02  1.05   Sodium 135 - 145 mmol/L 139  141  141   Potassium 3.5 -  5.1 mmol/L 3.7  3.7  4.1   Chloride 98 - 111 mmol/L 107  107  109   CO2 22 - 32 mmol/L _0 Calcium 8.9 - 10.3 mg/dL 9.2  9.1  9.3   Total Protein 6.5 - 8.1 g/dL 6.9  6.6  6.6   Total Bilirubin 0.3 - 1.2 mg/dL 0.5  0.5  0.6   Alkaline Phos 38 - 126 U/L 66  69  70   AST 15 - 41 U/L _1 ALT 0 - 44 U/L _2 Lab Results  Component Value Date   CEA1 2.9 09/10/2021   /  CEA  Date Value Ref Range Status  09/10/2021 2.9 0.0 - 4.7 ng/mL Final    Comment:    (NOTE)                             Nonsmokers          <3.9                             Smokers             <5.6 [employer] Electrochemiluminescence Immunoassay (ECLIA) Values obtained with different assay methods or kits cannot be used interchangeably.  Results cannot be interpreted as absolute evidence of the presence or absence of malignant disease. Performed At: [facility] [place], [place] [id number] Rush Farmer MD [id number]    No results found for: "PSA1" No results found for: "[id number]" No results found for: "CAN125"  No results found for: "TOTALPROTELP", "ALBUMINELP", "A1GS", "A2GS", "BETS", "BETA2SER", "GAMS", "MSPIKE", "SPEI" No results found for: "TIBC", "FERRITIN", "IRONPCTSAT" No results found for: "LDH"  STUDIES:  No results found.  EXAM:12/16/21 CT CHEST, ABDOMEN, AND PELVIS WITH CONTRAST IMPRESSION: Response to therapy of gastric primary and adjacent omental nodularity. No  new or progressive disease. Right larger than left lower lobe pulmonary nodules, felt to be similar to the 04/10/2016 remote CT and therefore benign. Progressive interstitial lung disease, right greater than left. This could be postinfectious/inflammatory or less likely represent nonspecific interstitial pneumonitis. Improvement in common duct dilation, likely incidental given chronicity.   EXAM:09/18/2021 NUCLEAR MEDICINE PET SKULL BASE TO THIGH CLINICAL DATA:  Initial treatment strategy for gastric cancer.   EXAM: NUCLEAR MEDICINE PET SKULL BASE TO THIGH   TECHNIQUE: 9.2 mCi F-18 FDG was injected intravenously. Full-ring PET imaging was performed from the skull base to thigh after the radiotracer. CT data was obtained and used for attenuation correction and anatomic localization.   Fasting blood glucose: 124 mg/dl   COMPARISON:  MRI July 20, 2021 and CT April 10, 2016   FINDINGS: Mediastinal blood pool activity: SUV max 2.4   Liver activity: SUV max NA   NECK: No hypermetabolic cervical adenopathy.   Symmetric hypermetabolic hyperplasia of the tonsils commonly reactive.   Incidental CT findings: none   CHEST: No hypermetabolic thoracic adenopathy.   No hypermetabolic pulmonary nodules or masses.   Incidental CT findings: Aortic atherosclerosis. Calcified mediastinal and right hilar lymph nodes. Motion degraded examination reveals no suspicious pulmonary nodules or masses. Patulous esophagus with a small hiatal hernia.   ABDOMEN/PELVIS: Evaluation of the gastric wall is limited by minimal distension, within this context: Similar diffuse nonspecific gastric wall thickening with no significant change in  the appearance of the possible gastric body ulceration seen on image 117/4 and diffuse hypermetabolic activity within the stomach demonstrating a max SUV of 8.6.   Mildly metabolic nodularity anterior and inferior to the greater curvature of the stomach with the  largest of which measures 22 x 13 mm on image 119/4 with a max SUV of 2.3.   No abnormal hypermetabolic activity within the liver, pancreas, adrenal glands or spleen.   No hypermetabolic abdominopelvic adenopathy.   Incidental CT findings: Similar mild intrahepatic and moderate extrahepatic biliary ductal dilation with the common duct measuring 13 mm. Colonic diverticulosis without findings of acute diverticulitis. Aortic atherosclerosis.   SKELETON: No focal hypermetabolic activity to suggest skeletal metastasis.   Incidental CT findings: Multilevel degenerative changes spine   IMPRESSION: 1. Evaluation of the gastric wall is limited by minimal distension, within this context, here is diffuse hypermetabolic activity within the stomach with similar diffuse nonspecific gastric wall thickening and no significant change in the appearance of the possible anterior gastric body ulceration. 2. Mildly metabolic omental nodularity anterior and inferior to the stomach, likely reflects omental disease involvement. 3. No convincing evidence of hypermetabolic metastatic disease in the neck, chest, or pelvis. 4. Similar mild intrahepatic and moderate extrahepatic biliary ductal dilation with the common duct measuring 13 mm no suspicious hypermetabolic lesion identified within the duct and no discrete lesion identified on prior MRI dated July 20, 2021. 5.  Aortic Atherosclerosis (ICD10-I70.0).        I,Mackenzie Lewis,acting as a scribe for Mackenzie Kaplan, MD.,have documented all relevant documentation on the behalf of Mackenzie Kaplan, MD,as directed by  Mackenzie Kaplan, MD while in the presence of Mackenzie Kaplan, MD.

## 2022-02-16 ENCOUNTER — Inpatient Hospital Stay: Payer: Medicare Other

## 2022-02-16 ENCOUNTER — Encounter: Payer: Self-pay | Admitting: Oncology

## 2022-02-16 ENCOUNTER — Other Ambulatory Visit: Payer: Self-pay | Admitting: Oncology

## 2022-02-16 ENCOUNTER — Inpatient Hospital Stay: Payer: Medicare Other | Attending: Hematology and Oncology | Admitting: Oncology

## 2022-02-16 ENCOUNTER — Telehealth: Payer: Self-pay | Admitting: Oncology

## 2022-02-16 VITALS — BP 163/77 | HR 90 | Temp 98.2°F | Resp 18 | Ht 64.0 in | Wt 187.2 lb

## 2022-02-16 DIAGNOSIS — Z79620 Long term (current) use of immunosuppressive biologic: Secondary | ICD-10-CM | POA: Insufficient documentation

## 2022-02-16 DIAGNOSIS — C168 Malignant neoplasm of overlapping sites of stomach: Secondary | ICD-10-CM

## 2022-02-16 DIAGNOSIS — C169 Malignant neoplasm of stomach, unspecified: Secondary | ICD-10-CM | POA: Diagnosis not present

## 2022-02-16 DIAGNOSIS — R21 Rash and other nonspecific skin eruption: Secondary | ICD-10-CM

## 2022-02-16 DIAGNOSIS — D649 Anemia, unspecified: Secondary | ICD-10-CM | POA: Diagnosis not present

## 2022-02-16 LAB — CBC AND DIFFERENTIAL
HCT: 39 (ref 36–46)
Hemoglobin: 13.2 (ref 12.0–16.0)
Neutrophils Absolute: 2.91
Platelets: 134 10*3/uL — AB (ref 150–400)
WBC: 6.2

## 2022-02-16 LAB — HEPATIC FUNCTION PANEL
ALT: 23 U/L (ref 7–35)
AST: 29 (ref 13–35)
Alkaline Phosphatase: 73 (ref 25–125)
Bilirubin, Total: 0.7

## 2022-02-16 LAB — BASIC METABOLIC PANEL
BUN: 19 (ref 4–21)
CO2: 21 (ref 13–22)
Chloride: 105 (ref 99–108)
Creatinine: 0.8 (ref 0.5–1.1)
Glucose: 101
Potassium: 4 mEq/L (ref 3.5–5.1)
Sodium: 135 — AB (ref 137–147)

## 2022-02-16 LAB — CMP (CANCER CENTER ONLY)
ALT: 19 U/L (ref 0–44)
AST: 23 U/L (ref 15–41)
Albumin: 4 g/dL (ref 3.5–5.0)
Alkaline Phosphatase: 62 U/L (ref 38–126)
Anion gap: 10 (ref 5–15)
BUN: 19 mg/dL (ref 8–23)
CO2: 22 mmol/L (ref 22–32)
Calcium: 9.2 mg/dL (ref 8.9–10.3)
Chloride: 104 mmol/L (ref 98–111)
Creatinine: 0.84 mg/dL (ref 0.44–1.00)
GFR, Estimated: >60 mL/min (ref >60)
Glucose, Bld: 93 mg/dL (ref 70–99)
Potassium: 4 mmol/L (ref 3.5–5.1)
Sodium: 136 mmol/L (ref 135–145)
Total Bilirubin: 0.8 mg/dL (ref 0.3–1.2)
Total Protein: 6.7 g/dL (ref 6.5–8.1)

## 2022-02-16 LAB — TSH: TSH: 2.087 u[IU]/mL (ref 0.350–4.500)

## 2022-02-16 LAB — CBC: RBC: 4.34 (ref 3.87–5.11)

## 2022-02-16 LAB — COMPREHENSIVE METABOLIC PANEL
Albumin: 4.2 (ref 3.5–5.0)
Calcium: 9.5 (ref 8.7–10.7)

## 2022-02-16 MED ORDER — TRIAMCINOLONE ACETONIDE 0.1 % EX OINT: TOPICAL_OINTMENT | Freq: Two times a day (BID) | CUTANEOUS | 0 refills | Status: DC

## 2022-02-16 MED FILL — Leucovorin Calcium For Inj 350 MG: INTRAMUSCULAR | Qty: 31.5 | Status: AC

## 2022-02-16 MED FILL — Oxaliplatin IV Soln 100 MG/20ML: INTRAVENOUS | Qty: 20 | Status: AC

## 2022-02-16 MED FILL — Nivolumab IV Soln 240 MG/24ML: INTRAVENOUS | Qty: 24 | Status: AC

## 2022-02-16 NOTE — Telephone Encounter (Signed)
Patient has been scheduled for follow-up visit per 02/16/22 LOS.  Pt given an appt calendar with date and time.

## 2022-02-17 ENCOUNTER — Telehealth: Payer: Self-pay

## 2022-02-17 ENCOUNTER — Inpatient Hospital Stay: Payer: Medicare Other

## 2022-02-17 ENCOUNTER — Encounter: Payer: Self-pay | Admitting: Oncology

## 2022-02-17 ENCOUNTER — Other Ambulatory Visit: Payer: Self-pay | Admitting: Oncology

## 2022-02-17 ENCOUNTER — Other Ambulatory Visit: Payer: Self-pay

## 2022-02-17 DIAGNOSIS — R21 Rash and other nonspecific skin eruption: Secondary | ICD-10-CM

## 2022-02-17 LAB — T4: T4, Total: 12.5 ug/dL — ABNORMAL HIGH (ref 4.5–12.0)

## 2022-02-17 MED ORDER — TRIAMCINOLONE ACETONIDE 0.1 % EX CREA: TOPICAL_CREAM | CUTANEOUS | 0 refills | Status: DC

## 2022-02-17 NOTE — Telephone Encounter (Signed)
Yes the other prescription needs to be rescheduled.

## 2022-02-17 NOTE — Telephone Encounter (Signed)
-----   Message from Derwood Kaplan, MD sent at 02/17/2022 12:09 PM EST ----- I sent it, do we need to cancel the other Rx? ----- Message ----- From: Belva Chimes, LPN Sent: 02/21/107  32:35 AM EST To: Derwood Kaplan, MD  Patient Triamcinolone cream needs to be sent to [facility]. It was sent to the wrong pharmacy yesterday.

## 2022-02-18 DIAGNOSIS — Z09 Encounter for follow-up examination after completed treatment for conditions other than malignant neoplasm: Secondary | ICD-10-CM | POA: Diagnosis not present

## 2022-02-18 DIAGNOSIS — Z1231 Encounter for screening mammogram for malignant neoplasm of breast: Secondary | ICD-10-CM | POA: Diagnosis not present

## 2022-02-18 DIAGNOSIS — C168 Malignant neoplasm of overlapping sites of stomach: Secondary | ICD-10-CM | POA: Diagnosis not present

## 2022-02-18 DIAGNOSIS — Z6832 Body mass index (BMI) 32.0-32.9, adult: Secondary | ICD-10-CM | POA: Diagnosis not present

## 2022-02-18 DIAGNOSIS — L270 Generalized skin eruption due to drugs and medicaments taken internally: Secondary | ICD-10-CM | POA: Diagnosis not present

## 2022-02-19 ENCOUNTER — Telehealth: Payer: Self-pay

## 2022-02-19 ENCOUNTER — Inpatient Hospital Stay: Payer: Medicare Other

## 2022-02-19 NOTE — Patient Outreach (Signed)
  Care Coordination   Initial Visit Note   02/19/2022 Name: Mackenzie Lewis MRN: [id number] DOB: 1935-09-12  Mackenzie Lewis is a 87 y.o. year old female who sees Prochnau, Chrys Racer, MD for primary care. I spoke with  Osie Bond by phone today.  What matters to the patients health and wellness today?  Placed call to patient today to review and offer [facility] care coordination program.  Patient reports she is doing well right now and denies any needs. Currently off chemo.      SDOH assessments and interventions completed:  No     Care Coordination Interventions:  No, not indicated   Follow up plan: No further intervention required.   Encounter Outcome:  Pt. Refused   Tomasa Rand, RN, BSN, CEN [facility] [employer] [phone]

## 2022-02-23 DIAGNOSIS — R531 Weakness: Secondary | ICD-10-CM | POA: Diagnosis not present

## 2022-02-23 DIAGNOSIS — M3210 Systemic lupus erythematosus, organ or system involvement unspecified: Secondary | ICD-10-CM | POA: Diagnosis not present

## 2022-02-25 ENCOUNTER — Other Ambulatory Visit: Payer: Self-pay

## 2022-02-25 DIAGNOSIS — Z1231 Encounter for screening mammogram for malignant neoplasm of breast: Secondary | ICD-10-CM | POA: Diagnosis not present

## 2022-02-26 ENCOUNTER — Other Ambulatory Visit: Payer: Self-pay | Admitting: Oncology

## 2022-02-26 ENCOUNTER — Inpatient Hospital Stay: Payer: Medicare Other

## 2022-02-26 ENCOUNTER — Inpatient Hospital Stay (INDEPENDENT_AMBULATORY_CARE_PROVIDER_SITE_OTHER): Payer: Medicare Other | Admitting: Oncology

## 2022-02-26 ENCOUNTER — Encounter: Payer: Self-pay | Admitting: Oncology

## 2022-02-26 VITALS — BP 165/80 | HR 92 | Temp 98.9°F | Resp 18 | Ht 64.0 in | Wt 188.2 lb

## 2022-02-26 DIAGNOSIS — C168 Malignant neoplasm of overlapping sites of stomach: Secondary | ICD-10-CM

## 2022-02-26 DIAGNOSIS — Z79620 Long term (current) use of immunosuppressive biologic: Secondary | ICD-10-CM | POA: Diagnosis not present

## 2022-02-26 DIAGNOSIS — C169 Malignant neoplasm of stomach, unspecified: Secondary | ICD-10-CM | POA: Diagnosis not present

## 2022-02-26 LAB — CMP (CANCER CENTER ONLY)
ALT: 20 U/L (ref 0–44)
AST: 23 U/L (ref 15–41)
Albumin: 4.3 g/dL (ref 3.5–5.0)
Alkaline Phosphatase: 65 U/L (ref 38–126)
Anion gap: 11 (ref 5–15)
BUN: 20 mg/dL (ref 8–23)
CO2: 23 mmol/L (ref 22–32)
Calcium: 9.6 mg/dL (ref 8.9–10.3)
Chloride: 103 mmol/L (ref 98–111)
Creatinine: 0.92 mg/dL (ref 0.44–1.00)
GFR, Estimated: >60 mL/min (ref >60)
Glucose, Bld: 104 mg/dL — ABNORMAL HIGH (ref 70–99)
Potassium: 3.8 mmol/L (ref 3.5–5.1)
Sodium: 137 mmol/L (ref 135–145)
Total Bilirubin: 0.6 mg/dL (ref 0.3–1.2)
Total Protein: 7.5 g/dL (ref 6.5–8.1)

## 2022-02-26 LAB — CBC WITH DIFFERENTIAL (CANCER CENTER ONLY)
Abs Immature Granulocytes: 0.05 10*3/uL (ref 0.00–0.07)
Basophils Absolute: 0.1 10*3/uL (ref 0.0–0.1)
Basophils Relative: 1 %
Eosinophils Absolute: 0.2 10*3/uL (ref 0.0–0.5)
Eosinophils Relative: 2 %
HCT: 40.5 % (ref 36.0–46.0)
Hemoglobin: 13.4 g/dL (ref 12.0–15.0)
Immature Granulocytes: 1 %
Lymphocytes Relative: 21 %
Lymphs Abs: 2.3 10*3/uL (ref 0.7–4.0)
MCH: 30.1 pg (ref 26.0–34.0)
MCHC: 33.1 g/dL (ref 30.0–36.0)
MCV: 91 fL (ref 80.0–100.0)
Monocytes Absolute: 0.9 10*3/uL (ref 0.1–1.0)
Monocytes Relative: 9 %
Neutro Abs: 7.4 10*3/uL (ref 1.7–7.7)
Neutrophils Relative %: 66 %
Platelet Count: 147 10*3/uL — ABNORMAL LOW (ref 150–400)
RBC: 4.45 MIL/uL (ref 3.87–5.11)
RDW: 13.7 % (ref 11.5–15.5)
WBC Count: 10.9 10*3/uL — ABNORMAL HIGH (ref 4.0–10.5)
nRBC: 0 % (ref 0.0–0.2)

## 2022-02-26 NOTE — Progress Notes (Signed)
[facility]  [address] [place],  [name]  [address] [phone]  Clinic Day: 02/26/22   Referring physician: Ernestene Kiel, MD   ASSESSMENT & PLAN:   Gastric adenocarcinoma This is a poorly differentiated adenocarcinoma with signet ring features and ulceration. Stain for HER2 was negative at 0. I would consider it as metastatic to the omentum and so that would stage this as a T4 N0 M1, stage IVB.  PET scan did not show evidence of metastatic disease other than the omentum. She has been on treatment with chemotherapy of FOLFOX and immunotherapy with nivolumab for 10 cycles with very good response, but now is suffering increasing dermatologic toxicities and neuropathy.   Plan For now we will stop everything and re-evaluate in 2 weeks. She will return in 2 weeks with CBC and CMP. She will be due for an CT and endoscopy scan in February, so I will make the referral to Dr. Lyda Jester. I think she will need a change in her treatment regimen. I think she is having a reaction to the chemotherapy as well as the immunotherapy so we will reevaluate in February and consider alternate treatment if needed. I reviewed all of this with her and her family and answered their questions. She understands and agrees with this plan.  I provided 20 minutes of face-to-face time during this this encounter and > 50% was spent counseling as documented under my assessment and plan.    Mackenzie Kaplan, MD [facility] [address] [place] [address] Dept: [phone] Dept Fax: [phone]   CHIEF COMPLAINT:  CC: Gastric cancer  Current Treatment: Chemotherapy/immunotherapy   HISTORY OF PRESENT ILLNESS:  Mackenzie Lewis is a 87 y.o. female with a history of gastric cancer who is referred in consultation with Dr. Sandria Senter for assessment and management.  She had noticed that she was having  regurgitation when eating and had lost over 30 pounds.  An ultrasound was done, revealing hepatic steatosis, and led to an MRI scan on June 5 which revealed gastric wall thickening with confluent nodularity of the omentum anterior to the stomach measuring 3.5 cm consistent with metastatic tumor.  She also had low-grade edema and wall thickening extending into the duodenum from the stomach.  She was referred to Dr. Sandria Senter and he did an EGD on July 7.  This revealed a large ulceration measuring 1.2 cm along the greater curvature.  She also had diffusely edematous and erythematous wall with erosions of the antrum and stiff and friable mucosa with oozing of blood.  These findings extended to the gastric fundus as well.  Pathology revealed a poorly differentiated adenocarcinoma with signet ring features from the biopsies of the ulcer as well as the antrum and the fundus of the stomach.  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  She was placed on omeprazole.  Her test for H. pylori was negative.  She was referred to Dr. Kendell Bane for consideration of surgery but he felt this was not resectable because of the extensive involvement and I agree.  I would consider this extending to the duodenum and also metastatic to the omentum.  PET scan confirmed these findings and she wished to pursue systemic intravenous therapy.  She continues to eat and has adjusted her diet to softer foods and liquids and is drinking boost so she has maintained her weight.  She does  have some Zofran ODT for nausea when needed.  She has had some anorexia, nausea, occasional vomiting, early satiety, dysphagia, and belching.  A CEA and CA 19-9 were normal. She has been started on FOLFOX chemotherapy along with immunotherapy.  INTERVAL HISTORY:  I have reviewed her chart and materials related to her cancer extensively and collaborated history with the patient. Summary of oncologic history  is as follows: Oncology History  Gastric cancer ([facility])  09/10/2021 Initial Diagnosis   Gastric cancer ([facility])   09/10/2021 Cancer Staging   Staging form: Stomach, AJCC 8th Edition - Clinical stage from 09/10/2021: Stage IVB (cT4b, cN0, cM1) - Signed by Mackenzie Kaplan, MD on 09/10/2021 Histopathologic type: Adenocarcinoma, NOS Stage prefix: Initial diagnosis Total positive nodes: 0 Histologic grade (G): G3 Histologic grading system: 3 grade system Sites of metastasis: Peritoneal surface Diagnostic confirmation: Positive histology PLUS positive immunophenotyping and/or positive genetic studies Specimen type: Endoscopy with Biopsy Staged by: Managing physician Carcinoembryonic antigen (CEA) (ng/mL): 2.8 Carbohydrate antigen 19-9 (CA 19-9) (U/mL): 4.9 HER2 status: Unknown Microsatellite instability (MSI): Unknown Tumor location in stomach: Other Clinical staging modalities: Biopsy, Endoscopy Stage used in treatment planning: Yes National guidelines used in treatment planning: Yes Type of national guideline used in treatment planning: NCCN   09/28/2021 - 10/14/2021 Chemotherapy   Patient is on Treatment Plan : GASTROESOPHAGEAL FOLFOX + Nivolumab q14d     09/28/2021 -  Chemotherapy   Patient is on Treatment Plan : GASTROESOPHAGEAL FOLFOX + Nivolumab q14d     12/09/2021 Genetic Testing   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  Report date is 12/09/2021.   The Multi-Cancer + RNA Panel offered by [facility] includes sequencing and/or deletion/duplication analysis of the following 84 genes:  AIP*, ALK, APC*, ATM*, AXIN2*, BAP1*, BARD1*, BLM*, BMPR1A*, BRCA1*, BRCA2*, BRIP1*, CASR, CDC73*, CDH1*, CDK4, CDKN1B*, CDKN1C*, CDKN2A, CEBPA, CHEK2*, CTNNA1*, DICER1*, DIS3L2*, EGFR, EPCAM, FH*, FLCN*, GATA2*, GPC3, GREM1, HOXB13, HRAS, KIT, MAX*, MEN1*, MET, MITF, MLH1*, MSH2*, MSH3*, MSH6*, MUTYH*, NBN*, NF1*, NF2*, NTHL1*, PALB2*, PDGFRA, PHOX2B, PMS2*,  POLD1*, POLE*, POT1*, PRKAR1A*, PTCH1*, PTEN*, RAD50*, RAD51C*, RAD51D*, RB1*, RECQL4, RET, RUNX1*, SDHA*, SDHAF2*, SDHB*, SDHC*, SDHD*, SMAD4*, SMARCA4*, SMARCB1*, SMARCE1*, STK11*, SUFU*, TERC, TERT, TMEM127*, Tp53*, TSC1*, TSC2*, VHL*, WRN*, and WT1.  RNA analysis is performed for * genes.    INTERVAL HISTORY Lilyona is here today for a follow-up for her gastric cancer and dermatologist appointment. Skin biopsy was done of her skin rash, and the pathology report revealed that her rash is caused by a drug reaction. She states that she currently feels well. After her last chemotherapy infusion she started to developed a fever and her rash increased over her arms and chest. Her arms and chest itch while her legs and back do not itch and have improved. I believe her skin can be reacting to her chemo treatments and I suggested we stop taking Oxaliplatin as she has already has 10 doses of the FOLFOX. Patient is starting to develop neuropathy, described as numbness and tingling in the tips of her fingers. We will stop her current therapy and give her a break for 2 weeks. She is due for an CT and endoscopy scan in February, so I will make the referral to Dr. Lyda Jester. I think she will need a change in her treatment regimen.  She denies signs of infection such as sore throat, sinus drainage, cough, or urinary symptoms.  She denies fevers or recurrent chills. She denies pain. She denies nausea, vomiting, chest  pain, dyspnea or cough. Her weight has been stable.  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  If that happens, she is to call us so that we can schedule her for an ultrasound to look for synovitis.     Essential hypertension 09/09/2016   Gastric cancer ([facility]) 09/10/2021   Hyperlipidemia 09/01/2016   Hypertension    Hypothyroidism 09/01/2016   Osteoarthritis of both feet 01/20/2016   Osteoarthritis, hand 01/20/2016   Thyroid disease   Degenerative disc disease History of endometriosis  Past Surgical History:  Procedure Laterality Date   APPENDECTOMY     LAPAROSCOPIC APPENDECTOMY N/A 04/10/2016   Procedure: APPENDECTOMY LAPAROSCOPIC;  Surgeon: Coralie Keens, MD;  Location: [facility];  Service: General;  Laterality: N/A;  Bilateral tubal ligation Total hysterectomy and bilateral salpingo-oophorectomy in 1980  Family History  Problem Relation Age of Onset   Hypertension Mother    Prostate cancer Father        metastatic; d. 70   Brain cancer Sister 54   Breast cancer Sister 79   AAA (abdominal aortic aneurysm) Brother    Leukemia Cousin        x2 maternal female cousins; d. before 20   Breast cancer Daughter 68       DCIS  Her sister has had breast cancer as well as a tumor of her head Her daughter has had breast cancer last year  Social History:  reports that she has never smoked. She has never used smokeless tobacco. She reports that she does not currently use alcohol. She reports that she does not use drugs.The patient is accompanied by her son, daughter-in-law and daughter today.  She is single but lives with a significant other.  She has the 2 children.  She worked in [employer] (medical) and denies any chemical or toxin exposures.  She grew up in [place] and [place].  She is active and healthy, especially for her age.  Allergies:  Allergies  Allergen Reactions   Doxycycline Rash   Sulfa Antibiotics Rash    Other reaction(s): Other (See Comments) "Made  me feel weird"    Current Medications: Current Outpatient Medications  Medication Sig Dispense Refill   aspirin 81 MG EC tablet Take 81 mg by mouth daily. Swallow whole.     Calcium Carbonate (CALCIUM 600 PO) Take 1 tablet by mouth daily.     Cholecalciferol (VITAMIN D3) 5000 units CAPS Take 1 capsule by mouth daily.     EXFORGE HCT 5-160-12.5 MG TABS Take 1 tablet by mouth daily.     famotidine (PEPCID) 40 MG tablet Take 40 mg by mouth at bedtime.     KRILL OIL PO Take 1 capsule by mouth daily. Unknown strenght     Lactobacillus TABS Take 1 tablet by mouth 2 (two) times daily.     levothyroxine (SYNTHROID, LEVOTHROID) 75 MCG tablet Take 75 mcg by mouth daily before breakfast.      LORazepam (ATIVAN) 0.5 MG tablet Take 1  tablet (0.5 mg total) by mouth at bedtime. 30 tablet 0   NON FORMULARY MMW: 3 parts Maalox 2 parts Benadryl 1 part viscious lidicaine  Disp. 6oz  Instructions: 84m swish and swallow every 3-4 hours     omeprazole (PRILOSEC) 40 MG capsule Take 1 capsule (40 mg total) by mouth 2 (two) times daily. 60 capsule 5   ondansetron (ZOFRAN) 4 MG tablet Take 4 mg by mouth every 4 (four) hours as needed.     ondansetron (ZOFRAN-ODT) 4 MG disintegrating tablet Take 1 tablet (4 mg total) by mouth every 8 (eight) hours as needed for nausea or vomiting. 90 tablet 0   polyethylene glycol powder (GLYCOLAX/MIRALAX) 17 GM/SCOOP powder SMARTSIG:1 scoopful By Mouth Daily     potassium chloride SA (KLOR-CON M) 20 MEQ tablet Take 1 tablet (20 mEq total) by mouth daily. 30 tablet 5   pravastatin (PRAVACHOL) 20 MG tablet Take 1 tablet (20 mg total) by mouth every evening. 90 tablet 3   Probiotic Product (PROBIOTIC DAILY PO) Take 1 tablet by mouth daily.     prochlorperazine (COMPAZINE) 10 MG tablet Take 1 tablet (10 mg total) by mouth every 6 (six) hours as needed for nausea or vomiting. 90 tablet 3   triamcinolone 0.1% oint-Eucerin equivalent cream 1:1 mixture Apply topically 2 (two) times  daily. 160 g 0   triamcinolone cream (KENALOG) 0.1 % Apply to affected skin twice daily 160 g 0   No current facility-administered medications for this visit.    REVIEW OF SYSTEMS:  Review of Systems  Constitutional: Negative.  Negative for appetite change, chills, diaphoresis, fatigue, fever and unexpected weight change.  HENT:  Negative.  Negative for hearing loss, lump/mass, mouth sores, nosebleeds, sore throat, tinnitus, trouble swallowing and voice change.   Eyes: Negative.  Negative for eye problems and icterus.  Respiratory: Negative.  Negative for chest tightness, cough, hemoptysis, shortness of breath and wheezing.   Cardiovascular: Negative.  Negative for chest pain, leg swelling and palpitations.  Gastrointestinal:  Negative for abdominal distention, abdominal pain, blood in stool, constipation, diarrhea, nausea, rectal pain and vomiting.  Endocrine: Negative.  Negative for hot flashes.  Genitourinary: Negative.  Negative for bladder incontinence, difficulty urinating, dyspareunia, dysuria, frequency, hematuria, menstrual problem, nocturia, pelvic pain, vaginal bleeding and vaginal discharge.   Musculoskeletal: Negative.  Negative for arthralgias, back pain, flank pain, gait problem, myalgias, neck pain and neck stiffness.  Skin:  Positive for rash. Negative for itching and wound.       Arms, chest, back and legs.  Neurological:  Positive for numbness (in her fingers). Negative for dizziness, extremity weakness, gait problem, headaches, light-headedness, seizures and speech difficulty.  Hematological: Negative.  Negative for adenopathy. Does not bruise/bleed easily.  Psychiatric/Behavioral: Negative.  Negative for confusion, decreased concentration, depression, sleep disturbance and suicidal ideas. The patient is not nervous/anxious.       VITALS:  Blood pressure (!) 165/80, pulse 92, temperature 98.9 F (37.2 C), temperature source Oral, resp. rate 18, height '5\' 4"'$  (1.626 m),  weight 188 lb 3.2 oz (85.4 kg), SpO2 96 %.  Wt Readings from Last 3 Encounters:  02/26/22 188 lb 3.2 oz (85.4 kg)  02/16/22 187 lb 3.2 oz (84.9 kg)  02/01/22 190 lb 1.9 oz (86.2 kg)    Body mass index is 32.3 kg/m.  Performance status (ECOG): 1 - Symptomatic but completely ambulatory  PHYSICAL EXAM:  Physical Exam Vitals and nursing note reviewed. Exam conducted with a chaperone present.  Constitutional:  General: She is not in acute distress.    Appearance: Normal appearance. She is normal weight. She is not ill-appearing, toxic-appearing or diaphoretic.  HENT:     Head: Normocephalic and atraumatic.     Right Ear: Tympanic membrane, ear canal and external ear normal. There is no impacted cerumen.     Left Ear: Tympanic membrane, ear canal and external ear normal. There is no impacted cerumen.     Nose: Nose normal. No congestion or rhinorrhea.     Mouth/Throat:     Mouth: Mucous membranes are moist.     Pharynx: Oropharynx is clear. No oropharyngeal exudate or posterior oropharyngeal erythema.  Eyes:     General: No scleral icterus.       Right eye: No discharge.        Left eye: No discharge.     Extraocular Movements: Extraocular movements intact.     Conjunctiva/sclera: Conjunctivae normal.     Pupils: Pupils are equal, round, and reactive to light.  Cardiovascular:     Rate and Rhythm: Normal rate and regular rhythm.     Pulses: Normal pulses.     Heart sounds: Normal heart sounds. No murmur heard.    No friction rub. No gallop.  Pulmonary:     Effort: Pulmonary effort is normal. No respiratory distress.     Breath sounds: Normal breath sounds. No stridor. No wheezing, rhonchi or rales.  Chest:     Chest wall: No tenderness.  Abdominal:     General: Bowel sounds are normal. There is no distension.     Palpations: Abdomen is soft. There is no hepatomegaly, splenomegaly or mass.     Tenderness: There is no abdominal tenderness. There is no right CVA tenderness,  left CVA tenderness, guarding or rebound.     Hernia: No hernia is present.  Musculoskeletal:        General: Normal range of motion.     Cervical back: Normal range of motion and neck supple. No rigidity.     Right lower leg: No edema.     Left lower leg: No edema.  Lymphadenopathy:     Cervical: No cervical adenopathy.     Right cervical: No superficial, deep or posterior cervical adenopathy.    Left cervical: No superficial, deep or posterior cervical adenopathy.     Upper Body:     Right upper body: No supraclavicular, axillary or pectoral adenopathy.     Left upper body: No supraclavicular, axillary or pectoral adenopathy.  Skin:    General: Skin is warm and dry.     Coloration: Skin is not jaundiced or pale.     Findings: Rash present. No bruising, erythema or lesion.     Comments: She has a blotchy macular rash of both legs which is persistent from last month. She also has a fading allergic type rash of the chest, arms, and back.   Neurological:     General: No focal deficit present.     Mental Status: She is alert and oriented to person, place, and time. Mental status is at baseline.     Cranial Nerves: No cranial nerve deficit.     Sensory: No sensory deficit.     Motor: No weakness.     Coordination: Coordination normal.     Gait: Gait normal.     Deep Tendon Reflexes: Reflexes normal.  Psychiatric:        Mood and Affect: Mood normal.        Behavior:  Behavior normal.        Thought Content: Thought content normal.        Judgment: Judgment normal.     LABS:      Latest Ref Rng & Units 02/26/2022    2:31 PM 02/16/2022   12:00 AM 01/28/2022   12:00 AM  CBC  WBC 4.0 - 10.5 K/uL 10.9  6.2     4.9      Hemoglobin 12.0 - 15.0 g/dL 13.4  13.2     13.4      Hematocrit 36.0 - 46.0 % 40.5  39     38      Platelets 150 - 400 K/uL 147  134     146         This result is from an external source.      Latest Ref Rng & Units 02/26/2022    2:31 PM 02/16/2022   10:04 AM  02/16/2022   12:00 AM  CMP  Glucose 70 - 99 mg/dL 104  93    BUN 8 - 23 mg/dL '20  19  19      '$ Creatinine 0.44 - 1.00 mg/dL 0.92  0.84  0.8      Sodium 135 - 145 mmol/L 137  136  135      Potassium 3.5 - 5.1 mmol/L 3.8  4.0  4.0      Chloride 98 - 111 mmol/L 103  104  105      CO2 22 - 32 mmol/L '23  22  21      '$ Calcium 8.9 - 10.3 mg/dL 9.6  9.2  9.5      Total Protein 6.5 - 8.1 g/dL 7.5  6.7    Total Bilirubin 0.3 - 1.2 mg/dL 0.6  0.8    Alkaline Phos 38 - 126 U/L 65  62  73  C     AST 15 - 41 U/L '23  23  29      '$ ALT 0 - 44 U/L '20  19  23        '$ C Corrected result   This result is from an external source.   Lab Results  Component Value Date   CEA1 2.9 09/10/2021   /  CEA  Date Value Ref Range Status  09/10/2021 2.9 0.0 - 4.7 ng/mL Final    Comment:    (NOTE)                             Nonsmokers          <3.9                             Smokers             <5.6 [employer] Electrochemiluminescence Immunoassay (ECLIA) Values obtained with different assay methods or kits cannot be used interchangeably.  Results cannot be interpreted as absolute evidence of the presence or absence of malignant disease. Performed At: [facility] [place], [place] [id number] Rush Farmer MD PN:[id number]    Component Ref Range & Units 10 d ago (02/16/22) 4 wk ago (01/28/22) 1 mo ago (01/14/22) 1 mo ago (12/31/21) 2 mo ago (12/03/21) 3 mo ago (11/19/21) 4 mo ago (10/22/21)  TSH 0.350 - 4.500 uIU/mL 2.087 1.773 CM 2.604 CM 1.987 CM 1.657 CM 1.709 CM 1.400 CM  Component Ref Range & Units 10 d ago (02/16/22) 4 wk ago (01/28/22) 1 mo ago (01/14/22) 1 mo ago (12/31/21) 2 mo ago (12/03/21) 3 mo ago (11/19/21) 4 mo ago (10/22/21)  T4, Total 4.5 - 12.0 ug/dL 12.5 High  14.6 High  CM 13.2 High  CM 12.3 High  CM 13.4 High  CM 12.7 High  CM 11.0 CM     No results found for: "PSA1" No results found for: "QAS341" No results found for: "CAN125"  No results  found for: "TOTALPROTELP", "ALBUMINELP", "A1GS", "A2GS", "BETS", "BETA2SER", "GAMS", "MSPIKE", "SPEI" No results found for: "TIBC", "FERRITIN", "IRONPCTSAT" No results found for: "LDH"  STUDIES:  No results found.  EXAM:12/16/21 CT CHEST, ABDOMEN, AND PELVIS WITH CONTRAST IMPRESSION: Response to therapy of gastric primary and adjacent omental nodularity. No new or progressive disease. Right larger than left lower lobe pulmonary nodules, felt to be similar to the 04/10/2016 remote CT and therefore benign. Progressive interstitial lung disease, right greater than left. This could be postinfectious/inflammatory or less likely represent nonspecific interstitial pneumonitis. Improvement in common duct dilation, likely incidental given chronicity.   EXAM:09/18/2021 NUCLEAR MEDICINE PET SKULL BASE TO THIGH CLINICAL DATA:  Initial treatment strategy for gastric cancer. EXAM: NUCLEAR MEDICINE PET SKULL BASE TO THIGH   TECHNIQUE: 9.2 mCi F-18 FDG was injected intravenously. Full-ring PET imaging was performed from the skull base to thigh after the radiotracer. CT data was obtained and used for attenuation correction and anatomic localization.   Fasting blood glucose: 124 mg/dl   COMPARISON:  MRI July 20, 2021 and CT April 10, 2016   FINDINGS: Mediastinal blood pool activity: SUV max 2.4   Liver activity: SUV max NA   NECK: No hypermetabolic cervical adenopathy.   Symmetric hypermetabolic hyperplasia of the tonsils commonly reactive.   Incidental CT findings: none   CHEST: No hypermetabolic thoracic adenopathy.   No hypermetabolic pulmonary nodules or masses.   Incidental CT findings: Aortic atherosclerosis. Calcified mediastinal and right hilar lymph nodes. Motion degraded examination reveals no suspicious pulmonary nodules or masses. Patulous esophagus with a small hiatal hernia.   ABDOMEN/PELVIS: Evaluation of the gastric wall is limited by minimal distension, within  this context: Similar diffuse nonspecific gastric wall thickening with no significant change in the appearance of the possible gastric body ulceration seen on image 117/4 and diffuse hypermetabolic activity within the stomach demonstrating a max SUV of 8.6.   Mildly metabolic nodularity anterior and inferior to the greater curvature of the stomach with the largest of which measures 22 x 13 mm on image 119/4 with a max SUV of 2.3.   No abnormal hypermetabolic activity within the liver, pancreas, adrenal glands or spleen.   No hypermetabolic abdominopelvic adenopathy.   Incidental CT findings: Similar mild intrahepatic and moderate extrahepatic biliary ductal dilation with the common duct measuring 13 mm. Colonic diverticulosis without findings of acute diverticulitis. Aortic atherosclerosis.   SKELETON: No focal hypermetabolic activity to suggest skeletal metastasis.   Incidental CT findings: Multilevel degenerative changes spine   IMPRESSION: 1. Evaluation of the gastric wall is limited by minimal distension, within this context, here is diffuse hypermetabolic activity within the stomach with similar diffuse nonspecific gastric wall thickening and no significant change in the appearance of the possible anterior gastric body ulceration. 2. Mildly metabolic omental nodularity anterior and inferior to the stomach, likely reflects omental disease involvement. 3. No convincing evidence of hypermetabolic metastatic disease in the neck, chest, or pelvis. 4. Similar mild intrahepatic and moderate extrahepatic biliary  ductal dilation with the common duct measuring 13 mm no suspicious hypermetabolic lesion identified within the duct and no discrete lesion identified on prior MRI dated July 20, 2021. 5.  Aortic Atherosclerosis (ICD10-I70.0).    I,Jasmine M Lassiter,acting as a scribe for Mackenzie Kaplan, MD.,have documented all relevant documentation on the behalf of Mackenzie Kaplan, MD,as directed by  Mackenzie Kaplan, MD while in the presence of Mackenzie Kaplan, MD.

## 2022-02-28 ENCOUNTER — Encounter: Payer: Self-pay | Admitting: Oncology

## 2022-03-01 ENCOUNTER — Other Ambulatory Visit: Payer: TRICARE For Life (TFL)

## 2022-03-01 ENCOUNTER — Encounter: Payer: Self-pay | Admitting: Oncology

## 2022-03-01 ENCOUNTER — Ambulatory Visit: Payer: TRICARE For Life (TFL) | Admitting: Oncology

## 2022-03-05 ENCOUNTER — Other Ambulatory Visit: Payer: Self-pay

## 2022-03-07 ENCOUNTER — Other Ambulatory Visit: Payer: Self-pay

## 2022-03-08 ENCOUNTER — Other Ambulatory Visit: Payer: Self-pay | Admitting: Oncology

## 2022-03-08 DIAGNOSIS — C168 Malignant neoplasm of overlapping sites of stomach: Secondary | ICD-10-CM

## 2022-03-10 ENCOUNTER — Telehealth: Payer: Self-pay | Admitting: Oncology

## 2022-03-10 ENCOUNTER — Other Ambulatory Visit: Payer: Self-pay

## 2022-03-10 NOTE — Telephone Encounter (Signed)
03/10/22 Spoke with patient and scheduled PET SCAN and DV.

## 2022-03-11 ENCOUNTER — Other Ambulatory Visit: Payer: Self-pay

## 2022-03-15 ENCOUNTER — Ambulatory Visit: Payer: TRICARE For Life (TFL) | Admitting: Oncology

## 2022-03-15 ENCOUNTER — Other Ambulatory Visit: Payer: TRICARE For Life (TFL)

## 2022-03-17 ENCOUNTER — Ambulatory Visit: Payer: TRICARE For Life (TFL)

## 2022-03-22 ENCOUNTER — Encounter (HOSPITAL_COMMUNITY)
Admission: RE | Admit: 2022-03-22 | Discharge: 2022-03-22 | Disposition: A | Discharge status: Home / Self Care | Payer: Medicare Other | Source: Ambulatory Visit | Attending: Oncology | Admitting: Oncology

## 2022-03-22 DIAGNOSIS — I700 Atherosclerosis of aorta: Secondary | ICD-10-CM | POA: Diagnosis not present

## 2022-03-22 DIAGNOSIS — C169 Malignant neoplasm of stomach, unspecified: Secondary | ICD-10-CM | POA: Diagnosis not present

## 2022-03-22 DIAGNOSIS — C168 Malignant neoplasm of overlapping sites of stomach: Secondary | ICD-10-CM | POA: Diagnosis not present

## 2022-03-22 LAB — GLUCOSE, CAPILLARY: Glucose-Capillary: 96 mg/dL (ref 70–99)

## 2022-03-22 MED ORDER — FLUDEOXYGLUCOSE F - 18 (FDG) INJECTION
9.9000 | Freq: Once | INTRAVENOUS | Status: AC
  Administered 2022-03-22: 9.43 via INTRAVENOUS

## 2022-03-22 NOTE — Progress Notes (Signed)
[facility]  [address] [place],  [place]  [id number] [phone]  Clinic Day: 03/24/22   Referring physician: Ernestene Kiel, MD   ASSESSMENT & PLAN:   Gastric adenocarcinoma This is a poorly differentiated adenocarcinoma with signet ring features and ulceration. Stain for HER2 was negative at 0. I would consider it as metastatic to the omentum and so that would stage this as a T4 N0 M1, stage IVB.  She has been on treatment with chemotherapy with FOLFOX and nivolumab for 10 cycles with very good response, but then had increasing dermatologic toxicities and neuropathy. Her PET scan from 03/22/2022 shows a new area of hypermetabolic activity in the right lower lobe of peripheral consolidation, which is more likely atelectasis or resolving pneumonia measuring 3cm. The thickening of her stomach has decreased with decreased metabolic activity and there is shrinkage of the omental metastasis from 33m to 119m The last time she received chemotherapy alone she had severe rash and fever and was admitted to the hospital, but she was also found to have pneumonia and a UTI at that time. She may be having rashes from both treatments but we will try immunotherapy alone to see how she does.   Plan I advised her we resume immunotherapy and hold off on the chemotherapy for now, next week. She is scheduled for a endoscopy on 04/15/2022. Her labs today are pending. She will receive Nivolumab only on the 12th and [name] again in 2 weeks with CBC and CMP. I reviewed all of this with her and her family and answered their questions. She understands and agrees with this plan.  I provided 30 minutes of face-to-face time during this this encounter and > 50% was spent counseling as documented under my assessment and plan.    ChDerwood KaplanMD COBrus[place]TBell GardensCAlaska[address]ept:  [phone] Fax: [phone] CHIEF COMPLAINT:  CC: Gastric cancer  Current Treatment: Chemotherapy/immunotherapy   HISTORY OF PRESENT ILLNESS:  Mackenzie CZAJAs a [age].o. female with a history of gastric cancer who is referred in consultation with Dr. TiSandria Senteror assessment and management.  She had noticed that she was having regurgitation when eating and had lost over 30 pounds.  An ultrasound was done, revealing hepatic steatosis, and led to an MRI scan on June 5 which revealed gastric wall thickening with confluent nodularity of the omentum anterior to the stomach measuring 3.5 cm consistent with metastatic tumor.  She also had low-grade edema and wall thickening extending into the duodenum from the stomach.  She was referred to Dr. TiSandria Senternd he did an EGD on July 7.  This revealed a large ulceration measuring 1.2 cm along the greater curvature.  She also had diffusely edematous and erythematous wall with erosions of the antrum and stiff and friable mucosa with oozing of blood.  These findings extended to the gastric fundus as well.  Pathology revealed a poorly differentiated adenocarcinoma with signet ring features from the biopsies of the ulcer as well as the antrum and the fundus of the stomach.  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  She was placed on omeprazole.  Her test for H. pylori was negative.  She was referred to Dr. RiKendell Baneor consideration of surgery but he felt this was not resectable because of the extensive involvement and  I agree.  I would consider this extending to the duodenum and also metastatic to the omentum.  PET scan confirmed these findings and she wished to pursue systemic intravenous therapy.  She continues to eat and has adjusted her diet to softer foods and liquids and is drinking boost so she has maintained her weight.  She does have some Zofran ODT for nausea when needed.  She has had  some anorexia, nausea, occasional vomiting, early satiety, dysphagia, and belching.  A CEA and CA 19-9 were normal. She has been started on FOLFOX chemotherapy along with immunotherapy.  INTERVAL HISTORY:  I have reviewed her chart and materials related to her cancer extensively and collaborated history with the patient. Summary of oncologic history is as follows: Oncology History  Gastric cancer ([facility])  09/10/2021 Initial Diagnosis   Gastric cancer ([facility])   09/10/2021 Cancer Staging   Staging form: Stomach, AJCC 8th Edition - Clinical stage from 09/10/2021: Stage IVB (cT4b, cN0, cM1) - Signed by Derwood Kaplan, MD on 09/10/2021 Histopathologic type: Adenocarcinoma, NOS Stage prefix: Initial diagnosis Total positive nodes: 0 Histologic grade (G): G3 Histologic grading system: 3 grade system Sites of metastasis: Peritoneal surface Diagnostic confirmation: Positive histology PLUS positive immunophenotyping and/or positive genetic studies Specimen type: Endoscopy with Biopsy Staged by: Managing physician Carcinoembryonic antigen (CEA) (ng/mL): 2.8 Carbohydrate antigen 19-9 (CA 19-9) (U/mL): 4.9 HER2 status: Unknown Microsatellite instability (MSI): Unknown Tumor location in stomach: Other Clinical staging modalities: Biopsy, Endoscopy Stage used in treatment planning: Yes National guidelines used in treatment planning: Yes Type of national guideline used in treatment planning: NCCN   09/28/2021 - 10/14/2021 Chemotherapy   Patient is on Treatment Plan : GASTROESOPHAGEAL FOLFOX + Nivolumab q14d     09/28/2021 -  Chemotherapy   Patient is on Treatment Plan : GASTROESOPHAGEAL FOLFOX + Nivolumab q14d     12/09/2021 Genetic Testing   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  Report date is 12/09/2021.   The Multi-Cancer + RNA Panel offered by [facility] includes sequencing and/or deletion/duplication analysis of the following 84 genes:  AIP*, ALK,  APC*, ATM*, AXIN2*, BAP1*, BARD1*, BLM*, BMPR1A*, BRCA1*, BRCA2*, BRIP1*, CASR, CDC73*, CDH1*, CDK4, CDKN1B*, CDKN1C*, CDKN2A, CEBPA, CHEK2*, CTNNA1*, DICER1*, DIS3L2*, EGFR, EPCAM, FH*, FLCN*, GATA2*, GPC3, GREM1, HOXB13, HRAS, KIT, MAX*, MEN1*, MET, MITF, MLH1*, MSH2*, MSH3*, MSH6*, MUTYH*, NBN*, NF1*, NF2*, NTHL1*, PALB2*, PDGFRA, PHOX2B, PMS2*, POLD1*, POLE*, POT1*, PRKAR1A*, PTCH1*, PTEN*, RAD50*, RAD51C*, RAD51D*, RB1*, RECQL4, RET, RUNX1*, SDHA*, SDHAF2*, SDHB*, SDHC*, SDHD*, SMAD4*, SMARCA4*, SMARCB1*, SMARCE1*, STK11*, SUFU*, TERC, TERT, TMEM127*, Tp53*, TSC1*, TSC2*, VHL*, WRN*, and WT1.  RNA analysis is performed for * genes.    INTERVAL HISTORY Mackenzie Lewis is here today for a follow-up for her gastric cancer. Patient states that she feels fine but still has a persistent resolving rash of her lower extremities,that improved, and is blotchy and fading. She has mild scattered slightly raised skin lesions on both upper extremities. Her PET scan from 03/22/2022 shows a new area of hypermetabolic activity in the right lower lobe is likely atelectasis or resolving pneumonia and measures 3cm. The thickening of her stomach has decreased with decreased metabolic activity and there is shrinkage of the omental metastasis from 16m to 18m We discussed her resuming treatment and my concern of her having another drug reaction to it. I informed her that if she does have a drug reaction, we would stop treatment and switch her to something else. I advised her we resume immunotherapy and hold off  on the chemotherapy for now, next week. She is scheduled for a endoscopy on 04/15/2022. Her labs today are pending. She will receive Nivolumab only on the 12th and [name] in 2 weeks with CBC and CMP.  She denies signs of infection such as sore throat, sinus drainage, cough, or urinary symptoms.  She denies fevers or recurrent chills. She denies pain. She denies nausea, vomiting, chest pain, dyspnea or cough. Her appetite  is good, her taste has improved, and her weight has increased 3 pounds over last month . She is accompanied by her daughter.   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  If that happens, she is to call us so that we can schedule her for an ultrasound to look for synovitis.     Essential hypertension 09/09/2016   Gastric cancer ([facility]) 09/10/2021   Hyperlipidemia 09/01/2016   Hypertension    Hypothyroidism 09/01/2016   Osteoarthritis of both feet 01/20/2016   Osteoarthritis, hand 01/20/2016   Thyroid disease   Degenerative disc disease History of endometriosis  Past Surgical History:  Procedure Laterality Date   APPENDECTOMY     LAPAROSCOPIC APPENDECTOMY N/A 04/10/2016   Procedure: APPENDECTOMY LAPAROSCOPIC;  Surgeon: Coralie Keens, MD;  Location: [facility];  Service: General;  Laterality: N/A;  Bilateral tubal ligation Total hysterectomy and bilateral salpingo-oophorectomy in 1980  Family History  Problem Relation Age of Onset   Hypertension Mother    Prostate cancer Father        metastatic; d. 14   Brain cancer Sister 27   Breast cancer Sister 63   AAA (abdominal aortic aneurysm) Brother    Leukemia Cousin        x2 maternal female cousins; d. before 21   Breast cancer Daughter 4       DCIS  Her sister has had breast cancer as well as a tumor of her head Her daughter has had breast cancer last year  Social History:  reports that she has never  smoked. She has never used smokeless tobacco. She reports that she does not currently use alcohol. She reports that she does not use drugs.The patient is accompanied by her son, daughter-in-law and daughter today.  She is single but lives with a significant other.  She has the 2 children.  She worked in [employer] (medical) and denies any chemical or toxin exposures.  She grew up in [place] and [place].  She is active and healthy, especially for her age.  Allergies:  Allergies  Allergen Reactions   Doxycycline Rash   Sulfa Antibiotics Rash    Other reaction(s): Other (See Comments) "Made me feel weird"    Current Medications: Current Outpatient Medications  Medication Sig Dispense Refill   aspirin 81 MG EC tablet Take 81 mg by mouth daily. Swallow whole.     Calcium Carbonate (CALCIUM 600 PO) Take 1 tablet by mouth daily.     Cholecalciferol (VITAMIN D3) 5000 units CAPS Take 1 capsule by mouth  daily.     EXFORGE HCT 5-160-12.5 MG TABS Take 1 tablet by mouth daily.     famotidine (PEPCID) 40 MG tablet Take 40 mg by mouth at bedtime.     KRILL OIL PO Take 1 capsule by mouth daily. Unknown strenght     Lactobacillus TABS Take 1 tablet by mouth 2 (two) times daily.     levothyroxine (SYNTHROID, LEVOTHROID) 75 MCG tablet Take 75 mcg by mouth daily before breakfast.      LORazepam (ATIVAN) 0.5 MG tablet Take 1 tablet (0.5 mg total) by mouth at bedtime. 30 tablet 0   NON FORMULARY MMW: 3 parts Maalox 2 parts Benadryl 1 part viscious lidicaine  Disp. 6oz  Instructions: 23m swish and swallow every 3-4 hours     omeprazole (PRILOSEC) 40 MG capsule Take 1 capsule (40 mg total) by mouth 2 (two) times daily. 60 capsule 5   ondansetron (ZOFRAN) 4 MG tablet Take 4 mg by mouth every 4 (four) hours as needed.     ondansetron (ZOFRAN-ODT) 4 MG disintegrating tablet Take 1 tablet (4 mg total) by mouth every 8 (eight) hours as needed for nausea or vomiting. 90 tablet 0   polyethylene glycol powder (GLYCOLAX/MIRALAX)  17 GM/SCOOP powder SMARTSIG:1 scoopful By Mouth Daily     potassium chloride SA (KLOR-CON M) 20 MEQ tablet Take 1 tablet (20 mEq total) by mouth daily. 30 tablet 5   pravastatin (PRAVACHOL) 20 MG tablet Take 1 tablet (20 mg total) by mouth every evening. 90 tablet 3   Probiotic Product (PROBIOTIC DAILY PO) Take 1 tablet by mouth daily.     prochlorperazine (COMPAZINE) 10 MG tablet Take 1 tablet (10 mg total) by mouth every 6 (six) hours as needed for nausea or vomiting. 90 tablet 3   triamcinolone 0.1% oint-Eucerin equivalent cream 1:1 mixture Apply topically 2 (two) times daily. 160 g 0   triamcinolone cream (KENALOG) 0.1 % Apply to affected skin twice daily 160 g 0   No current facility-administered medications for this visit.    REVIEW OF SYSTEMS:  Review of Systems  Constitutional: Negative.  Negative for appetite change, chills, diaphoresis, fatigue, fever and unexpected weight change.  HENT:  Negative.  Negative for hearing loss, lump/mass, mouth sores, nosebleeds, sore throat, tinnitus, trouble swallowing and voice change.   Eyes: Negative.  Negative for eye problems and icterus.  Respiratory: Negative.  Negative for chest tightness, cough, hemoptysis, shortness of breath and wheezing.   Cardiovascular: Negative.  Negative for chest pain, leg swelling and palpitations.  Gastrointestinal:  Negative for abdominal distention, abdominal pain, blood in stool, constipation, diarrhea, nausea, rectal pain and vomiting.  Endocrine: Negative.  Negative for hot flashes.  Genitourinary: Negative.  Negative for bladder incontinence, difficulty urinating, dyspareunia, dysuria, frequency, hematuria, menstrual problem, nocturia, pelvic pain, vaginal bleeding and vaginal discharge.   Musculoskeletal: Negative.  Negative for arthralgias, back pain, flank pain, gait problem, myalgias, neck pain and neck stiffness.  Skin:  Positive for rash. Negative for itching and wound.       Persistent resolving rash  of the legs.  Neurological:  Positive for numbness (in her fingers). Negative for dizziness, extremity weakness, gait problem, headaches, light-headedness, seizures and speech difficulty.  Hematological: Negative.  Negative for adenopathy. Does not bruise/bleed easily.  Psychiatric/Behavioral: Negative.  Negative for confusion, decreased concentration, depression, sleep disturbance and suicidal ideas. The patient is not nervous/anxious.       VITALS:  Blood pressure (!) 166/73, pulse 80, temperature 97.8 F (36.6  C), temperature source Oral, resp. rate 16, height 5' 4"$  (1.626 m), weight 190 lb 3.2 oz (86.3 kg), SpO2 98 %.  Wt Readings from Last 3 Encounters:  03/30/22 190 lb 4 oz (86.3 kg)  03/24/22 190 lb 3.2 oz (86.3 kg)  02/26/22 188 lb 3.2 oz (85.4 kg)    Body mass index is 32.65 kg/m.  Performance status (ECOG): 1 - Symptomatic but completely ambulatory  PHYSICAL EXAM:  Physical Exam Vitals and nursing note reviewed. Exam conducted with a chaperone present.  Constitutional:      General: She is not in acute distress.    Appearance: Normal appearance. She is normal weight. She is not ill-appearing, toxic-appearing or diaphoretic.  HENT:     Head: Normocephalic and atraumatic.     Right Ear: Tympanic membrane, ear canal and external ear normal. There is no impacted cerumen.     Left Ear: Tympanic membrane, ear canal and external ear normal. There is no impacted cerumen.     Nose: Nose normal. No congestion or rhinorrhea.     Mouth/Throat:     Mouth: Mucous membranes are moist.     Pharynx: Oropharynx is clear. No oropharyngeal exudate or posterior oropharyngeal erythema.  Eyes:     General: No scleral icterus.       Right eye: No discharge.        Left eye: No discharge.     Extraocular Movements: Extraocular movements intact.     Conjunctiva/sclera: Conjunctivae normal.     Pupils: Pupils are equal, round, and reactive to light.  Neck:     Vascular: No carotid bruit.   Cardiovascular:     Rate and Rhythm: Normal rate and regular rhythm.     Pulses: Normal pulses.     Heart sounds: Normal heart sounds. No murmur heard.    No friction rub. No gallop.  Pulmonary:     Effort: Pulmonary effort is normal. No respiratory distress.     Breath sounds: Normal breath sounds. No stridor. No wheezing, rhonchi or rales.  Chest:     Chest wall: No tenderness.  Abdominal:     General: Bowel sounds are normal. There is no distension.     Palpations: Abdomen is soft. There is no hepatomegaly, splenomegaly or mass.     Tenderness: There is no abdominal tenderness. There is no right CVA tenderness, left CVA tenderness, guarding or rebound.     Hernia: No hernia is present.     Comments: She still has mild firmness across the upper mid abdomen.   Musculoskeletal:        General: No swelling, tenderness, deformity or signs of injury. Normal range of motion.     Cervical back: Normal range of motion and neck supple. No rigidity or tenderness.     Right lower leg: No edema.     Left lower leg: No edema.  Lymphadenopathy:     Cervical: No cervical adenopathy.     Right cervical: No superficial, deep or posterior cervical adenopathy.    Left cervical: No superficial, deep or posterior cervical adenopathy.     Upper Body:     Right upper body: No supraclavicular, axillary or pectoral adenopathy.     Left upper body: No supraclavicular, axillary or pectoral adenopathy.  Skin:    General: Skin is warm and dry.     Coloration: Skin is not jaundiced or pale.     Findings: Rash present. No bruising, erythema or lesion.     Comments:  She has persistent blotchy type rash of her legs which is fading. While she has mild scattered slightly raised skin lesions on both upper extremities.   Neurological:     General: No focal deficit present.     Mental Status: She is alert and oriented to person, place, and time. Mental status is at baseline.     Cranial Nerves: No cranial nerve  deficit.     Sensory: No sensory deficit.     Motor: No weakness.     Coordination: Coordination normal.     Gait: Gait normal.     Deep Tendon Reflexes: Reflexes normal.  Psychiatric:        Mood and Affect: Mood normal.        Behavior: Behavior normal.        Thought Content: Thought content normal.        Judgment: Judgment normal.     LABS:      Latest Ref Rng & Units 03/24/2022    8:46 AM 02/26/2022    2:31 PM 02/16/2022   12:00 AM  CBC  WBC 4.0 - 10.5 K/uL 8.3  10.9  6.2      Hemoglobin 12.0 - 15.0 g/dL 12.9  13.4  13.2      Hematocrit 36.0 - 46.0 % 39.3  40.5  39      Platelets 150 - 400 K/uL 187  147  134         This result is from an external source.      Latest Ref Rng & Units 03/24/2022    8:46 AM 02/26/2022    2:31 PM 02/16/2022   10:04 AM  CMP  Glucose 70 - 99 mg/dL 124  104  93   BUN 8 - 23 mg/dL 32  20  19   Creatinine 0.44 - 1.00 mg/dL 1.56  0.92  0.84   Sodium 135 - 145 mmol/L 141  137  136   Potassium 3.5 - 5.1 mmol/L 3.7  3.8  4.0   Chloride 98 - 111 mmol/L 105  103  104   CO2 22 - 32 mmol/L 25  23  22   $ Calcium 8.9 - 10.3 mg/dL 9.1  9.6  9.2   Total Protein 6.5 - 8.1 g/dL 7.1  7.5  6.7   Total Bilirubin 0.3 - 1.2 mg/dL 0.7  0.6  0.8   Alkaline Phos 38 - 126 U/L 60  65  62   AST 15 - 41 U/L 22  23  23   $ ALT 0 - 44 U/L 15  20  19    $ Lab Results  Component Value Date   CEA1 2.9 09/10/2021   /  CEA  Date Value Ref Range Status  09/10/2021 2.9 0.0 - 4.7 ng/mL Final    Comment:    (NOTE)                             Nonsmokers          <3.9                             Smokers             <5.6 [employer] Electrochemiluminescence Immunoassay (ECLIA) Values obtained with different assay methods or kits cannot be used interchangeably.  Results cannot be interpreted as absolute evidence of the presence or absence of malignant disease.  Performed At: [facility] [place], [place] [id number] Rush Farmer MD  [id number]    Component Ref Range & Units 03/22/2022 6 mo ago  Glucose-Capillary 70 - 99 mg/dL 96 124 High  CM   Component Ref Range & Units 10 d ago (02/16/22) 4 wk ago (01/28/22) 1 mo ago (01/14/22) 1 mo ago (12/31/21) 2 mo ago (12/03/21) 3 mo ago (11/19/21) 4 mo ago (10/22/21)  TSH 0.350 - 4.500 uIU/mL 2.087 1.773 CM 2.604 CM 1.987 CM 1.657 CM 1.709 CM 1.400 CM   Component Ref Range & Units 10 d ago (02/16/22) 4 wk ago (01/28/22) 1 mo ago (01/14/22) 1 mo ago (12/31/21) 2 mo ago (12/03/21) 3 mo ago (11/19/21) 4 mo ago (10/22/21)  T4, Total 4.5 - 12.0 ug/dL 12.5 High  14.6 High  CM 13.2 High  CM 12.3 High   CM 13.4 High  CM 12.7 High  CM 11.0 CM     No results found for: "PSA1" No results found for: "[id number]" No results found for: "CAN125"  No results found for: "TOTALPROTELP", "ALBUMINELP", "A1GS", "A2GS", "BETS", "BETA2SER", "GAMS", "MSPIKE", "SPEI" No results found for: "TIBC", "FERRITIN", "IRONPCTSAT" No results found for: "LDH"  STUDIES:  NM PET Image Restage (PS) Skull Base to Thigh (F-18 FDG)  Result Date: 03/23/2022 CLINICAL DATA:  Subsequent treatment strategy for gastric carcinoma. EXAM: NUCLEAR MEDICINE PET SKULL BASE TO THIGH TECHNIQUE: 9.4 mCi F-18 FDG was injected intravenously. Full-ring PET imaging was performed from the skull base to thigh after the radiotracer. CT data was obtained and used for attenuation correction and anatomic localization. Fasting blood glucose: 96 mg/dl COMPARISON:  PET-CT 09/17/2021 FINDINGS: Mediastinal blood pool activity: SUV max 2.7 Liver activity: SUV max NA NECK: No hypermetabolic lymph nodes in the neck. Incidental CT findings: None. CHEST: No hypermetabolic mediastinal or hilar nodes. No suspicious pulmonary nodules on the CT scan. Focus of peripheral consolidation in the posterior RIGHT lower lobe measuring 3 cm (image 73/4) has moderate metabolic activity. Incidental CT findings: Port in the anterior chest wall with tip in distal SVC.  ABDOMEN/PELVIS: Decrease in metabolic activity associated with the stomach. No abnormal metabolic activity associated with the stomach above background physiologic activity. Additionally the previously measured omental nodularity is decreased in size. Nodularity measuring 13 mm (image 117/CT series 4) decreased from 21 mm. No associated metabolic activity. No new nodularity in the lesser omentum. No peritoneal metastasis. No hypermetabolic abdominopelvic lymph nodes. No abnormal metabolic activity liver. Incidental CT findings: Atherosclerotic calcification of the aorta. SKELETON: No focal hypermetabolic activity to suggest skeletal metastasis. Incidental CT findings: None. IMPRESSION: 1. Resolution of metabolic activity associated with the stomach. 2. Decrease in size of omental nodularity. No associated metabolic activity. 3. No evidence of new or progressive gastric carcinoma. 4. New peripheral consolidation in the RIGHT lower lobe with moderate metabolic activity. Favor focus of atelectasis versus less likely pneumonia. 5.  Aortic Atherosclerosis (ICD10-I70.0). Electronically Signed   By: Suzy Bouchard M.D.   On: 03/23/2022 09:02    EXAM: 03/22/2022 NUCLEAR MEDICINE PET SKULL BASE TO THIGH IMPRESSION: 1. Resolution of metabolic activity associated with the stomach. 2. Decrease in size of omental nodularity. No associated metabolic activity. 3. No evidence of new or progressive gastric carcinoma. 4. New peripheral consolidation in the RIGHT lower lobe with moderate metabolic activity. Favor focus of atelectasis versus less likely pneumonia. 5.  Aortic Atherosclerosis (ICD10-I70.0).  EXAM:12/16/21 CT CHEST, ABDOMEN, AND PELVIS WITH CONTRAST IMPRESSION: Response to therapy of gastric primary  and adjacent omental nodularity. No new or progressive disease. Right larger than left lower lobe pulmonary nodules, felt to be similar to the 04/10/2016 remote CT and therefore benign. Progressive  interstitial lung disease, right greater than left. This could be postinfectious/inflammatory or less likely represent nonspecific interstitial pneumonitis. Improvement in common duct dilation, likely incidental given chronicity.   EXAM:09/18/2021 NUCLEAR MEDICINE PET SKULL BASE TO THIGH CLINICAL DATA:  Initial treatment strategy for gastric cancer. EXAM: NUCLEAR MEDICINE PET SKULL BASE TO THIGH   TECHNIQUE: 9.2 mCi F-18 FDG was injected intravenously. Full-ring PET imaging was performed from the skull base to thigh after the radiotracer. CT data was obtained and used for attenuation correction and anatomic localization.   Fasting blood glucose: 124 mg/dl   COMPARISON:  MRI July 20, 2021 and CT April 10, 2016   FINDINGS: Mediastinal blood pool activity: SUV max 2.4   Liver activity: SUV max NA   NECK: No hypermetabolic cervical adenopathy.   Symmetric hypermetabolic hyperplasia of the tonsils commonly reactive.   Incidental CT findings: none   CHEST: No hypermetabolic thoracic adenopathy.   No hypermetabolic pulmonary nodules or masses.   Incidental CT findings: Aortic atherosclerosis. Calcified mediastinal and right hilar lymph nodes. Motion degraded examination reveals no suspicious pulmonary nodules or masses. Patulous esophagus with a small hiatal hernia.   ABDOMEN/PELVIS: Evaluation of the gastric wall is limited by minimal distension, within this context: Similar diffuse nonspecific gastric wall thickening with no significant change in the appearance of the possible gastric body ulceration seen on image 117/4 and diffuse hypermetabolic activity within the stomach demonstrating a max SUV of 8.6.   Mildly metabolic nodularity anterior and inferior to the greater curvature of the stomach with the largest of which measures 22 x 13 mm on image 119/4 with a max SUV of 2.3.   No abnormal hypermetabolic activity within the liver, pancreas, adrenal glands or  spleen.   No hypermetabolic abdominopelvic adenopathy.   Incidental CT findings: Similar mild intrahepatic and moderate extrahepatic biliary ductal dilation with the common duct measuring 13 mm. Colonic diverticulosis without findings of acute diverticulitis. Aortic atherosclerosis.   SKELETON: No focal hypermetabolic activity to suggest skeletal metastasis.   Incidental CT findings: Multilevel degenerative changes spine   IMPRESSION: 1. Evaluation of the gastric wall is limited by minimal distension, within this context, here is diffuse hypermetabolic activity within the stomach with similar diffuse nonspecific gastric wall thickening and no significant change in the appearance of the possible anterior gastric body ulceration. 2. Mildly metabolic omental nodularity anterior and inferior to the stomach, likely reflects omental disease involvement. 3. No convincing evidence of hypermetabolic metastatic disease in the neck, chest, or pelvis. 4. Similar mild intrahepatic and moderate extrahepatic biliary ductal dilation with the common duct measuring 13 mm no suspicious hypermetabolic lesion identified within the duct and no discrete lesion identified on prior MRI dated July 20, 2021. 5.  Aortic Atherosclerosis (ICD10-I70.0).    I,Jasmine M Lassiter,acting as a scribe for Derwood Kaplan, MD.,have documented all relevant documentation on the behalf of Derwood Kaplan, MD,as directed by  Derwood Kaplan, MD while in the presence of Derwood Kaplan, MD.

## 2022-03-24 ENCOUNTER — Encounter: Payer: Self-pay | Admitting: Oncology

## 2022-03-24 ENCOUNTER — Inpatient Hospital Stay: Payer: Medicare Other | Attending: Hematology and Oncology

## 2022-03-24 ENCOUNTER — Inpatient Hospital Stay: Payer: Medicare Other | Admitting: Oncology

## 2022-03-24 ENCOUNTER — Other Ambulatory Visit: Payer: Self-pay | Admitting: Oncology

## 2022-03-24 VITALS — BP 166/73 | HR 80 | Temp 97.8°F | Resp 16 | Ht 64.0 in | Wt 190.2 lb

## 2022-03-24 DIAGNOSIS — E876 Hypokalemia: Secondary | ICD-10-CM

## 2022-03-24 DIAGNOSIS — C169 Malignant neoplasm of stomach, unspecified: Secondary | ICD-10-CM | POA: Diagnosis not present

## 2022-03-24 DIAGNOSIS — C168 Malignant neoplasm of overlapping sites of stomach: Secondary | ICD-10-CM

## 2022-03-24 DIAGNOSIS — C786 Secondary malignant neoplasm of retroperitoneum and peritoneum: Secondary | ICD-10-CM | POA: Diagnosis not present

## 2022-03-24 DIAGNOSIS — Z5111 Encounter for antineoplastic chemotherapy: Secondary | ICD-10-CM | POA: Diagnosis not present

## 2022-03-24 DIAGNOSIS — Z79899 Other long term (current) drug therapy: Secondary | ICD-10-CM | POA: Insufficient documentation

## 2022-03-24 LAB — CBC WITH DIFFERENTIAL (CANCER CENTER ONLY)
Abs Immature Granulocytes: 0.02 10*3/uL (ref 0.00–0.07)
Basophils Absolute: 0.1 10*3/uL (ref 0.0–0.1)
Basophils Relative: 1 %
Eosinophils Absolute: 0.3 10*3/uL (ref 0.0–0.5)
Eosinophils Relative: 3 %
HCT: 39.3 % (ref 36.0–46.0)
Hemoglobin: 12.9 g/dL (ref 12.0–15.0)
Immature Granulocytes: 0 %
Lymphocytes Relative: 19 %
Lymphs Abs: 1.6 10*3/uL (ref 0.7–4.0)
MCH: 30.3 pg (ref 26.0–34.0)
MCHC: 32.8 g/dL (ref 30.0–36.0)
MCV: 92.3 fL (ref 80.0–100.0)
Monocytes Absolute: 0.7 10*3/uL (ref 0.1–1.0)
Monocytes Relative: 9 %
Neutro Abs: 5.6 10*3/uL (ref 1.7–7.7)
Neutrophils Relative %: 68 %
Platelet Count: 187 10*3/uL (ref 150–400)
RBC: 4.26 MIL/uL (ref 3.87–5.11)
RDW: 13.2 % (ref 11.5–15.5)
WBC Count: 8.3 10*3/uL (ref 4.0–10.5)
nRBC: 0 % (ref 0.0–0.2)

## 2022-03-24 LAB — CMP (CANCER CENTER ONLY)
ALT: 15 U/L (ref 0–44)
AST: 22 U/L (ref 15–41)
Albumin: 3.8 g/dL (ref 3.5–5.0)
Alkaline Phosphatase: 60 U/L (ref 38–126)
Anion gap: 11 (ref 5–15)
BUN: 32 mg/dL — ABNORMAL HIGH (ref 8–23)
CO2: 25 mmol/L (ref 22–32)
Calcium: 9.1 mg/dL (ref 8.9–10.3)
Chloride: 105 mmol/L (ref 98–111)
Creatinine: 1.56 mg/dL — ABNORMAL HIGH (ref 0.44–1.00)
GFR, Estimated: 33 mL/min — ABNORMAL LOW (ref >60)
Glucose, Bld: 124 mg/dL — ABNORMAL HIGH (ref 70–99)
Potassium: 3.7 mmol/L (ref 3.5–5.1)
Sodium: 141 mmol/L (ref 135–145)
Total Bilirubin: 0.7 mg/dL (ref 0.3–1.2)
Total Protein: 7.1 g/dL (ref 6.5–8.1)

## 2022-03-24 LAB — TSH: TSH: 1.835 u[IU]/mL (ref 0.350–4.500)

## 2022-03-25 ENCOUNTER — Encounter: Payer: Self-pay | Admitting: Oncology

## 2022-03-25 ENCOUNTER — Other Ambulatory Visit: Payer: Self-pay | Admitting: Pharmacist

## 2022-03-25 ENCOUNTER — Other Ambulatory Visit: Payer: Self-pay

## 2022-03-25 DIAGNOSIS — C168 Malignant neoplasm of overlapping sites of stomach: Secondary | ICD-10-CM

## 2022-03-26 ENCOUNTER — Encounter: Payer: Self-pay | Admitting: Oncology

## 2022-03-26 LAB — T4: T4, Total: 10.5 ug/dL (ref 4.5–12.0)

## 2022-03-29 ENCOUNTER — Encounter: Payer: Self-pay | Admitting: Oncology

## 2022-03-29 MED FILL — Nivolumab IV Soln 240 MG/24ML: INTRAVENOUS | Qty: 24 | Status: AC

## 2022-03-30 ENCOUNTER — Inpatient Hospital Stay: Payer: Medicare Other

## 2022-03-30 VITALS — BP 163/87 | HR 95 | Temp 97.4°F | Resp 18 | Ht 64.0 in | Wt 190.2 lb

## 2022-03-30 DIAGNOSIS — C169 Malignant neoplasm of stomach, unspecified: Secondary | ICD-10-CM | POA: Diagnosis not present

## 2022-03-30 DIAGNOSIS — C786 Secondary malignant neoplasm of retroperitoneum and peritoneum: Secondary | ICD-10-CM | POA: Diagnosis not present

## 2022-03-30 DIAGNOSIS — Z79899 Other long term (current) drug therapy: Secondary | ICD-10-CM | POA: Diagnosis not present

## 2022-03-30 DIAGNOSIS — Z5111 Encounter for antineoplastic chemotherapy: Secondary | ICD-10-CM | POA: Diagnosis not present

## 2022-03-30 DIAGNOSIS — C168 Malignant neoplasm of overlapping sites of stomach: Secondary | ICD-10-CM

## 2022-03-30 MED ORDER — SODIUM CHLORIDE 0.9 % IV SOLN
240.0000 mg | Freq: Once | INTRAVENOUS | Status: AC
  Administered 2022-03-30: 240 mg via INTRAVENOUS
  Filled 2022-03-30: qty 24

## 2022-03-30 MED ORDER — SODIUM CHLORIDE 0.9 % IV SOLN: Freq: Once | INTRAVENOUS | Status: AC

## 2022-03-30 MED ORDER — HEPARIN SOD (PORK) LOCK FLUSH 100 UNIT/ML IV SOLN
500.0000 [IU] | Freq: Once | INTRAVENOUS | Status: AC | PRN
  Administered 2022-03-30: 500 [IU]

## 2022-03-30 MED ORDER — SODIUM CHLORIDE 0.9% FLUSH
10.0000 mL | INTRAVENOUS | Status: DC | PRN
  Administered 2022-03-30: 10 mL

## 2022-03-30 NOTE — Patient Instructions (Signed)
[facility]  Discharge Instructions: Thank you for choosing [facility] to provide your oncology and hematology care.  If you have a lab appointment with the [facility], please go directly to the [facility] and check in at the registration area.   Wear comfortable clothing and clothing appropriate for easy access to any Portacath or PICC line.   We strive to give you quality time with your provider. You may need to reschedule your appointment if you arrive late (15 or more minutes).  Arriving late affects you and other patients whose appointments are after yours.  Also, if you miss three or more appointments without notifying the office, you may be dismissed from the clinic at the provider's discretion.      For prescription refill requests, have your pharmacy contact our office and allow 72 hours for refills to be completed.    Today you received the following chemotherapy and/or immunotherapy agents opdivo      To help prevent nausea and vomiting after your treatment, we encourage you to take your nausea medication as directed.  BELOW ARE SYMPTOMS THAT SHOULD BE REPORTED IMMEDIATELY: *FEVER GREATER THAN 100.4 F (38 C) OR HIGHER *CHILLS OR SWEATING *NAUSEA AND VOMITING THAT IS NOT CONTROLLED WITH YOUR NAUSEA MEDICATION *UNUSUAL SHORTNESS OF BREATH *UNUSUAL BRUISING OR BLEEDING *URINARY PROBLEMS (pain or burning when urinating, or frequent urination) *BOWEL PROBLEMS (unusual diarrhea, constipation, pain near the anus) TENDERNESS IN MOUTH AND THROAT WITH OR WITHOUT PRESENCE OF ULCERS (sore throat, sores in mouth, or a toothache) UNUSUAL RASH, SWELLING OR PAIN  UNUSUAL VAGINAL DISCHARGE OR ITCHING   Items with * indicate a potential emergency and should be followed up as soon as possible or go to the Emergency Department if any problems should occur.  Please show the CHEMOTHERAPY ALERT CARD or IMMUNOTHERAPY ALERT CARD at check-in to the  Emergency Department and triage nurse.  Should you have questions after your visit or need to cancel or reschedule your appointment, please contact [facility]  Dept: [phone]  and follow the prompts.  Office hours are 8:00 a.m. to 4:30 p.m. Monday - Friday. Please note that voicemails left after 4:00 p.m. may not be returned until the following business day.  We are closed weekends and major holidays. You have access to a nurse at all times for urgent questions. Please call the main number to the clinic Dept: [phone] and follow the prompts.  For any non-urgent questions, you may also contact your provider using MyChart. We now offer e-Visits for anyone 21 and older to request care online for non-urgent symptoms. For details visit [email or web address].   Also download the MyChart app! Go to the app store, search "MyChart", open the app, select [facility], and log in with your MyChart username and password.

## 2022-04-02 ENCOUNTER — Telehealth: Payer: Self-pay

## 2022-04-02 ENCOUNTER — Other Ambulatory Visit: Payer: Self-pay

## 2022-04-02 NOTE — Telephone Encounter (Signed)
-----   Message from Derwood Kaplan, MD sent at 04/01/2022  1:51 PM EST ----- Regarding: call Tell her blood counts and thiyroid are good but kidneys are dry, I need her to drink more fluids and we will recheck next week

## 2022-04-02 NOTE — Telephone Encounter (Signed)
Patient notified. Patient also has increased her fluid intake.

## 2022-04-05 NOTE — Progress Notes (Signed)
[place]  [address] [place],  [place]  [id number] [phone]  Clinic Day: 04/07/22  Referring physician: Ernestene Kiel, MD   ASSESSMENT & PLAN:   Gastric adenocarcinoma This is a poorly differentiated adenocarcinoma with signet ring features and ulceration. Stain for HER2 was negative at 0. I would consider it as metastatic to the omentum and so that would stage this as a T4 N0 M1, stage IVB.  She has been on treatment with chemotherapy with FOLFOX and nivolumab for 10 cycles with very good response, but then had increasing dermatologic toxicities and neuropathy. Her PET scan from 03/22/2022 shows a new area of hypermetabolic activity in the right lower lobe of peripheral consolidation, which is more likely atelectasis or resolving pneumonia measuring 3cm. The thickening of her stomach has decreased with decreased metabolic activity and there is shrinkage of the omental metastasis from 42m to 135m The last time she received chemotherapy alone she had severe rash and fever and was admitted to the hospital, but she was also found to have pneumonia and a UTI at that time. We are not sure which medication caused her rashes but she tolerated her immunotherapy last week without a problem.   Renal Dysfunction Her creatinine was elevated at 1.62 last time and she was given 1 liter of normal saline and encouraged to drink more fluids. However, today it is up to 2.2 with a Bun of 46 and is likely prerenal. I will therefore bring her in for IV fluids and we will need to recheck this next week. If it continues to worsen, we will need to do a ultrasound of her kidneys.   Plan Her creatinine today is up to 2.2 with a Bun of 46 and is likely prerenal and I will bring her in for IV fluids and to recheck next week. Her CBC's today are normal. After dropping Oxaliplatin, I recommend her adding back the 5FU and Leucovorin, but we will need to resolve her renal  dysfunction issues first. Her day1 cycle 12 is scheduled is due on 04/13/2022 but she requested that we postpone it to the following week. She will have a endoscopy done on 04/15/2022 and will receive treatment on 04/19/2022. Her last labs showed her BUN was at 32 with a creatinine of 1.56, which was a new finding.I will review the results with her at her next appointment with me. [name] back in 3 weeks with CBC and CMP. I reviewed all of this with her and her family and answered their questions. She understands and agrees with this plan.  I provided 15 minutes of face-to-face time during this this encounter and > 50% was spent counseling as documented under my assessment and plan.    ADDENDUM: Once I saw her latest CMP from today, I contacted the patient.  Her BUN is 46 with a creatinine of 2.2 and so I will schedule her for 1 L of normal saline and recheck labs next week.   ChDerwood KaplanMD COMisquamicut[address]TGarlandC[place]aska[address]ept: [phone] Fax: [phone] CHIEF COMPLAINT:  CC: Gastric cancer  Current Treatment: Chemotherapy/immunotherapy   HISTORY OF PRESENT ILLNESS:  IrCRISSY Lewis a [age].o. female with a history of gastric cancer who is referred in consultation with Dr. TiSandria Senteror assessment and management.  She had noticed that she was having regurgitation when eating and  had lost over 30 pounds.  An ultrasound was done, revealing hepatic steatosis, and led to an MRI scan on June 5 which revealed gastric wall thickening with confluent nodularity of the omentum anterior to the stomach measuring 3.5 cm consistent with metastatic tumor.  She also had low-grade edema and wall thickening extending into the duodenum from the stomach.  She was referred to Dr. Sandria Senter and he did an EGD on July 7.  This revealed a large ulceration measuring 1.2 cm along the greater  curvature.  She also had diffusely edematous and erythematous wall with erosions of the antrum and stiff and friable mucosa with oozing of blood.  These findings extended to the gastric fundus as well.  Pathology revealed a poorly differentiated adenocarcinoma with signet ring features from the biopsies of the ulcer as well as the antrum and the fundus of the stomach.  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  She was placed on omeprazole.  Her test for H. pylori was negative.  She was referred to Dr. Kendell Bane for consideration of surgery but he felt this was not resectable because of the extensive involvement and I agree.  I would consider this extending to the duodenum and also metastatic to the omentum.  PET scan confirmed these findings and she wished to pursue systemic intravenous therapy.  She continues to eat and has adjusted her diet to softer foods and liquids and is drinking boost so she has maintained her weight.  She does have some Zofran ODT for nausea when needed.  She has had some anorexia, nausea, occasional vomiting, early satiety, dysphagia, and belching.  A CEA and CA 19-9 were normal. She has been started on FOLFOX chemotherapy along with immunotherapy.  I have reviewed her chart and materials related to her cancer extensively and collaborated history with the patient. Summary of oncologic history is as follows: Oncology History  Gastric cancer ([facility])  09/10/2021 Initial Diagnosis   Gastric cancer ([facility])   09/10/2021 Cancer Staging   Staging form: Stomach, AJCC 8th Edition - Clinical stage from 09/10/2021: Stage IVB (cT4b, cN0, cM1) - Signed by Derwood Kaplan, MD on 09/10/2021 Histopathologic type: Adenocarcinoma, NOS Stage prefix: Initial diagnosis Total positive nodes: 0 Histologic grade (G): G3 Histologic grading system: 3 grade system Sites of metastasis: Peritoneal surface Diagnostic confirmation: Positive histology  PLUS positive immunophenotyping and/or positive genetic studies Specimen type: Endoscopy with Biopsy Staged by: Managing physician Carcinoembryonic antigen (CEA) (ng/mL): 2.8 Carbohydrate antigen 19-9 (CA 19-9) (U/mL): 4.9 HER2 status: Unknown Microsatellite instability (MSI): Unknown Tumor location in stomach: Other Clinical staging modalities: Biopsy, Endoscopy Stage used in treatment planning: Yes National guidelines used in treatment planning: Yes Type of national guideline used in treatment planning: NCCN   09/28/2021 - 10/14/2021 Chemotherapy   Patient is on Treatment Plan : GASTROESOPHAGEAL FOLFOX + Nivolumab q14d     09/28/2021 -  Chemotherapy   Patient is on Treatment Plan : GASTROESOPHAGEAL FOLFOX + Nivolumab q14d     12/09/2021 Genetic Testing   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  Report date is 12/09/2021.   The Multi-Cancer + RNA Panel offered by [facility] includes sequencing and/or deletion/duplication analysis of the following 84 genes:  AIP*, ALK, APC*, ATM*, AXIN2*, BAP1*, BARD1*, BLM*, BMPR1A*, BRCA1*, BRCA2*, BRIP1*, CASR, CDC73*, CDH1*, CDK4, CDKN1B*, CDKN1C*, CDKN2A, CEBPA, CHEK2*, CTNNA1*, DICER1*, DIS3L2*, EGFR, EPCAM, FH*, FLCN*, GATA2*, GPC3, GREM1, HOXB13, HRAS, KIT, MAX*, MEN1*, MET, MITF, MLH1*,  MSH2*, MSH3*, MSH6*, MUTYH*, NBN*, NF1*, NF2*, NTHL1*, PALB2*, PDGFRA, PHOX2B, PMS2*, POLD1*, POLE*, POT1*, PRKAR1A*, PTCH1*, PTEN*, RAD50*, RAD51C*, RAD51D*, RB1*, RECQL4, RET, RUNX1*, SDHA*, SDHAF2*, SDHB*, SDHC*, SDHD*, SMAD4*, SMARCA4*, SMARCB1*, SMARCE1*, STK11*, SUFU*, TERC, TERT, TMEM127*, Tp53*, TSC1*, TSC2*, VHL*, WRN*, and WT1.  RNA analysis is performed for * genes.     INTERVAL HISTORY:  Kassi is here today for a follow-up for her gastric cancer. Her PET scan from 03/22/2022 shows a new area of hypermetabolic activity in the right lower lobe is likely atelectasis or resolving pneumonia and measures 3cm. The  thickening of her stomach has decreased with decreased metabolic activity and there is shrinkage of the omental metastasis from 11m to 183m Patient states that she feels well and complains that the neuropathy in her hands and toes getting worse. She had her immunotherapy last week and has had no negative symptoms. Her rash continues to improve but has left some scarring of her lower extremities this is asymptomatic. We discussed other treatment options after dropping Oxaliplatin. I recommend her adding back the 5FU and Leucovorin and we agreed upon that. Day1 cycle 12 is scheduled is due on 04/13/2022 but she requested that we postpone it to the following week. She will have a endoscopy done on 04/15/2022 and will receive treatment on 04/19/2022. Her last labs showed her BUN was at 32 with a creatinine of 1.56, which was a new finding. Her CBC today is normal. I will review the results of her CMP with her at her next appointment with me. [name] back in 3 weeks with CBC and CMP. She denies signs of infection such as sore throat, sinus drainage, cough, or urinary symptoms.  She denies fevers or recurrent chills. She denies pain. She denies nausea, vomiting, chest pain, dyspnea or cough. Her appetite is good and her taste has improved. Her weight has been stable. She is accompanied today with her daughter.   HISTORY:   Past Medical History:  Diagnosis Date   Appendicitis with peritonitis 04/10/2016   Atypical chest pain 09/09/2016   Benign hypertensive renal disease 09/01/2016   Bilateral primary osteoarthritis of knee 01/20/2016   Borderline diabetes 09/09/2016   CKD (chronic kidney disease), stage II [id number] Cyclic citrullinated peptide (CCP) antibody positive 01/20/2016   Because she has positive CCP, I want to make sure we monitor the patient closely and we encouraged the patient to look for symptoms that include increased hand stiffness, swelling and redness to the MCP joint.  If that  happens, she is to call usKoreao that we can schedule her for an ultrasound to look for synovitis.     Essential hypertension 09/09/2016   G[facility] [place]   Hyperlipidemia 09/01/2016   Hypertension    Hypothyroidism 09/01/2016   Osteoarthritis of both feet 01/20/2016   Osteoarthritis, hand 01/20/2016   Thyroid disease   Degenerative disc disease History of endometriosis  Past Surgical History:  Procedure Laterality Date   APPENDECTOMY     LAPAROSCOPIC APPENDECTOMY N/A 04/10/2016   Procedure: APPENDECTOMY LAPAROSCOPIC;  Surgeon: DoCoralie KeensMD;  [facility] Service: General;  Laterality: N/A;  Bilateral tubal ligation Total hysterectomy and bilateral salpingo-oophorectomy in 1980  Family History  Problem Relation Age of Onset   Hypertension Mother    Prostate cancer Father        metastatic; d. [phone] Brain cancer Sister [phone] Breast cancer Sister 7239 AAA (abdominal aortic aneurysm)  Brother    Leukemia Cousin        x2 maternal female cousins; d. before 40   Breast cancer Daughter 22       DCIS  Her sister has had breast cancer as well as a tumor of her head Her daughter has had breast cancer last year  Social History:  reports that she has never smoked. She has never used smokeless tobacco. She reports that she does not currently use alcohol. She reports that she does not use drugs.The patient is accompanied by her son, daughter-in-law and daughter today.  She is single but lives with a significant other.  She has the 2 children.  She worked in [employer] (medical) and denies any chemical or toxin exposures.  She grew up in [place] and [place].  She is active and healthy, especially for her age.  Allergies:  Allergies  Allergen Reactions   Doxycycline Rash   Sulfa Antibiotics Rash and Other (See Comments)    Other reaction(s): Other (See Comments)  "Made me feel weird"    Current Medications: Current Outpatient Medications  Medication Sig Dispense Refill   magic  mouthwash SOLN SWISH AND SWALLOW 5ML BY MOUTH EVERY THREE TO FOUR HOURS     aspirin 81 MG EC tablet Take 81 mg by mouth daily. Swallow whole.     Calcium Carbonate (CALCIUM 600 PO) Take 1 tablet by mouth daily.     Cholecalciferol (VITAMIN D3) 5000 units CAPS Take 1 capsule by mouth daily.     EXFORGE HCT 5-160-12.5 MG TABS Take 1 tablet by mouth daily.     famotidine (PEPCID) 40 MG tablet Take 40 mg by mouth at bedtime.     KRILL OIL PO Take 1 capsule by mouth daily. Unknown strenght     Lactobacillus TABS Take 1 tablet by mouth 2 (two) times daily.     levothyroxine (SYNTHROID, LEVOTHROID) 75 MCG tablet Take 75 mcg by mouth daily before breakfast.      LORazepam (ATIVAN) 0.5 MG tablet Take 1 tablet (0.5 mg total) by mouth at bedtime. 30 tablet 0   NON FORMULARY MMW: 3 parts Maalox 2 parts Benadryl 1 part viscious lidicaine  Disp. 6oz  Instructions: 55m swish and swallow every 3-4 hours     omeprazole (PRILOSEC) 40 MG capsule Take 1 capsule (40 mg total) by mouth 2 (two) times daily. 60 capsule 5   ondansetron (ZOFRAN) 4 MG tablet Take 4 mg by mouth every 4 (four) hours as needed.     ondansetron (ZOFRAN-ODT) 4 MG disintegrating tablet Take 1 tablet (4 mg total) by mouth every 8 (eight) hours as needed for nausea or vomiting. 90 tablet 0   polyethylene glycol powder (GLYCOLAX/MIRALAX) 17 GM/SCOOP powder SMARTSIG:1 scoopful By Mouth Daily     potassium chloride SA (KLOR-CON M) 20 MEQ tablet Take 1 tablet (20 mEq total) by mouth daily. 30 tablet 5   pravastatin (PRAVACHOL) 20 MG tablet Take 1 tablet (20 mg total) by mouth every evening. 90 tablet 3   predniSONE (DELTASONE) 10 MG tablet Take 2 tablets (20 mg total) by mouth 2 (two) times daily with a meal. 100 tablet 1   Probiotic Product (PROBIOTIC DAILY PO) Take 1 tablet by mouth daily.     prochlorperazine (COMPAZINE) 10 MG tablet Take 1 tablet (10 mg total) by mouth every 6 (six) hours as needed for nausea or vomiting. 90 tablet 3    triamcinolone 0.1% oint-Eucerin equivalent cream 1:1 mixture Apply topically 2 (two) times daily.  160 g 0   triamcinolone cream (KENALOG) 0.1 % Apply to affected skin twice daily (Patient taking differently: daily as needed. Apply to affected skin twice daily) 160 g 0   No current facility-administered medications for this visit.    REVIEW OF SYSTEMS:  Review of Systems  Constitutional: Negative.  Negative for appetite change, chills, diaphoresis, fatigue, fever and unexpected weight change.  HENT:  Negative.  Negative for hearing loss, lump/mass, mouth sores, nosebleeds, sore throat, tinnitus, trouble swallowing and voice change.   Eyes: Negative.  Negative for eye problems and icterus.  Respiratory: Negative.  Negative for chest tightness, cough, hemoptysis, shortness of breath and wheezing.   Cardiovascular: Negative.  Negative for chest pain, leg swelling and palpitations.  Gastrointestinal:  Positive for constipation. Negative for abdominal distention, abdominal pain, blood in stool, diarrhea, nausea, rectal pain and vomiting.  Endocrine: Negative.  Negative for hot flashes.  Genitourinary: Negative.  Negative for bladder incontinence, difficulty urinating, dyspareunia, dysuria, frequency, hematuria, menstrual problem, nocturia, pelvic pain, vaginal bleeding and vaginal discharge.   Musculoskeletal: Negative.  Negative for arthralgias, back pain, flank pain, gait problem, myalgias, neck pain and neck stiffness.  Skin:  Positive for rash (improved). Negative for itching and wound.       Persistent resolving rash of the legs.  Neurological:  Positive for numbness (in her fingers and toes). Negative for dizziness, extremity weakness, gait problem, headaches, light-headedness, seizures and speech difficulty.  Hematological: Negative.  Negative for adenopathy. Does not bruise/bleed easily.  Psychiatric/Behavioral: Negative.  Negative for confusion, decreased concentration, depression, sleep  disturbance and suicidal ideas. The patient is not nervous/anxious.       VITALS:  Blood pressure 128/82, pulse 84, temperature 97.9 F (36.6 C), temperature source Oral, resp. rate 20, height '5\' 4"'$  (1.626 m), weight 189 lb 6.4 oz (85.9 kg), SpO2 97 %.  Wt Readings from Last 3 Encounters:  04/19/22 189 lb (85.7 kg)  04/16/22 187 lb 12 oz (85.2 kg)  04/12/22 188 lb 4.8 oz (85.4 kg)    Body mass index is 32.51 kg/m.  Performance status (ECOG): 1 - Symptomatic but completely ambulatory  PHYSICAL EXAM:  Physical Exam Vitals and nursing note reviewed. Exam conducted with a chaperone present.  Constitutional:      General: She is not in acute distress.    Appearance: Normal appearance. She is normal weight. She is not ill-appearing, toxic-appearing or diaphoretic.  HENT:     Head: Normocephalic and atraumatic.     Right Ear: Tympanic membrane, ear canal and external ear normal. There is no impacted cerumen.     Left Ear: Tympanic membrane, ear canal and external ear normal. There is no impacted cerumen.     Nose: Nose normal. No congestion or rhinorrhea.     Mouth/Throat:     Mouth: Mucous membranes are moist.     Pharynx: Oropharynx is clear. No oropharyngeal exudate or posterior oropharyngeal erythema.  Eyes:     General: No scleral icterus.       Right eye: No discharge.        Left eye: No discharge.     Extraocular Movements: Extraocular movements intact.     Conjunctiva/sclera: Conjunctivae normal.     Pupils: Pupils are equal, round, and reactive to light.  Neck:     Vascular: No carotid bruit.  Cardiovascular:     Rate and Rhythm: Normal rate and regular rhythm.     Pulses: Normal pulses.     Heart sounds: Normal  heart sounds. No murmur heard.    No friction rub. No gallop.  Pulmonary:     Effort: Pulmonary effort is normal. No respiratory distress.     Breath sounds: Normal breath sounds. No stridor. No wheezing, rhonchi or rales.  Chest:     Chest wall: No  tenderness.  Abdominal:     General: Bowel sounds are normal. There is no distension.     Palpations: Abdomen is soft. There is no hepatomegaly, splenomegaly or mass.     Tenderness: There is no abdominal tenderness. There is no right CVA tenderness, left CVA tenderness, guarding or rebound.     Hernia: No hernia is present.  Musculoskeletal:        General: No swelling, tenderness, deformity or signs of injury. Normal range of motion.     Cervical back: Normal range of motion and neck supple. No rigidity or tenderness.     Right lower leg: No edema.     Left lower leg: No edema.  Lymphadenopathy:     Cervical: No cervical adenopathy.     Right cervical: No superficial, deep or posterior cervical adenopathy.    Left cervical: No superficial, deep or posterior cervical adenopathy.     Upper Body:     Right upper body: No supraclavicular, axillary or pectoral adenopathy.     Left upper body: No supraclavicular, axillary or pectoral adenopathy.  Skin:    General: Skin is warm and dry.     Coloration: Skin is not jaundiced or pale.     Findings: Rash present. No bruising, erythema or lesion.     Comments: She has persistent blotchy type rash of her legs which is fading.  Neurological:     General: No focal deficit present.     Mental Status: She is alert and oriented to person, place, and time. Mental status is at baseline.     Cranial Nerves: No cranial nerve deficit.     Sensory: No sensory deficit.     Motor: No weakness.     Coordination: Coordination normal.     Gait: Gait normal.     Deep Tendon Reflexes: Reflexes normal.  Psychiatric:        Mood and Affect: Mood normal.        Behavior: Behavior normal.        Thought Content: Thought content normal.        Judgment: Judgment normal.     LABS:      Latest Ref Rng & Units 04/15/2022    8:56 AM 04/12/2022   12:00 AM 04/07/2022   12:00 AM  CBC  WBC 4.0 - 10.5 K/uL 12.8  9.7     10.6      Hemoglobin 12.0 - 15.0 g/dL 12.6   13.3     12.8      Hematocrit 36.0 - 46.0 % 36.8  38     37      Platelets 150 - 400 K/uL 215  197     202         This result is from an external source.      Latest Ref Rng & Units 04/15/2022    8:56 AM 04/12/2022   12:00 AM 04/07/2022   12:00 AM  CMP  Glucose 70 - 99 mg/dL 98     BUN 8 - 23 mg/dL 41  33     46      Creatinine 0.44 - 1.00 mg/dL 2.21  2.0  2.2      Sodium 135 - 145 mmol/L 135  139     138      Potassium 3.5 - 5.1 mmol/L 3.3  3.5     3.8      Chloride 98 - 111 mmol/L 100  104     102      CO2 22 - 32 mmol/L '25  24     25      '$ Calcium 8.9 - 10.3 mg/dL 9.1  9.3     9.4      Total Protein 6.5 - 8.1 g/dL 7.7     Total Bilirubin 0.3 - 1.2 mg/dL 0.5     Alkaline Phos 38 - 126 U/L 63  61     70      AST 15 - 41 U/L '19  23     24      '$ ALT 0 - 44 U/L '14  18     15         '$ This result is from an external source.   Lab Results  Component Value Date   CEA1 2.9 09/10/2021   /  CEA  Date Value Ref Range Status  09/10/2021 2.9 0.0 - 4.7 ng/mL Final    Comment:    (NOTE)                             Nonsmokers          <3.9                             Smokers             <5.6 [employer] Electrochemiluminescence Immunoassay (ECLIA) Values obtained with different assay methods or kits cannot be used interchangeably.  Results cannot be interpreted as absolute evidence of the presence or absence of malignant disease. Performed At: [facility] [place], [place] [id number] Rush Farmer MD [id number]    Component Ref Range & Units 03/22/2022 6 mo ago  Glucose-Capillary 70 - 99 mg/dL 96 124 High  CM   Component Ref Range & Units 12 d ago (03/24/22) 1 mo ago (02/16/22) 2 mo ago (01/28/22) 2 mo ago (01/14/22) 3 mo ago (12/31/21)  TSH 0.350 - 4.500 uIU/mL 1.835 2.087 CM 1.773 CM 2.604 CM 1.987     Component Ref Range & Units 12 d ago (03/24/22) 1 mo ago (02/16/22) 2 mo ago (01/28/22) 2 mo ago (01/14/22) 3 mo ago (12/31/21)  T4,  Total 4.5 - 12.0 ug/dL 10.5 12.5 High  CM 14.6 High  CM 13.2 High  CM 12.3 High  CM   No results found for: "PSA1" No results found for: "[id number]" No results found for: "CAN125"  No results found for: "TOTALPROTELP", "ALBUMINELP", "A1GS", "A2GS", "BETS", "BETA2SER", "GAMS", "MSPIKE", "SPEI" No results found for: "TIBC", "FERRITIN", "IRONPCTSAT" No results found for: "LDH"  STUDIES:  NM PET Image Restage (PS) Skull Base to Thigh (F-18 FDG)  Result Date: 03/23/2022 CLINICAL DATA:  Subsequent treatment strategy for gastric carcinoma. EXAM: NUCLEAR MEDICINE PET SKULL BASE TO THIGH TECHNIQUE: 9.4 mCi F-18 FDG was injected intravenously. Full-ring PET imaging was performed from the skull base to thigh after the radiotracer. CT data was obtained and used for attenuation correction and anatomic localization. Fasting blood glucose: 96 mg/dl COMPARISON:  PET-CT 09/17/2021 FINDINGS: Mediastinal blood pool activity: SUV max 2.7 Liver  activity: SUV max NA NECK: No hypermetabolic lymph nodes in the neck. Incidental CT findings: None. CHEST: No hypermetabolic mediastinal or hilar nodes. No suspicious pulmonary nodules on the CT scan. Focus of peripheral consolidation in the posterior RIGHT lower lobe measuring 3 cm (image 73/4) has moderate metabolic activity. Incidental CT findings: Port in the anterior chest wall with tip in distal SVC. ABDOMEN/PELVIS: Decrease in metabolic activity associated with the stomach. No abnormal metabolic activity associated with the stomach above background physiologic activity. Additionally the previously measured omental nodularity is decreased in size. Nodularity measuring 13 mm (image 117/CT series 4) decreased from 21 mm. No associated metabolic activity. No new nodularity in the lesser omentum. No peritoneal metastasis. No hypermetabolic abdominopelvic lymph nodes. No abnormal metabolic activity liver. Incidental CT findings: Atherosclerotic calcification of the aorta. SKELETON: No  focal hypermetabolic activity to suggest skeletal metastasis. Incidental CT findings: None. IMPRESSION: 1. Resolution of metabolic activity associated with the stomach. 2. Decrease in size of omental nodularity. No associated metabolic activity. 3. No evidence of new or progressive gastric carcinoma. 4. New peripheral consolidation in the RIGHT lower lobe with moderate metabolic activity. Favor focus of atelectasis versus less likely pneumonia. 5.  Aortic Atherosclerosis (ICD10-I70.0). Electronically Signed   By: Suzy Bouchard M.D.   On: 03/23/2022 09:02     EXAM: 03/22/2022 NUCLEAR MEDICINE PET SKULL BASE TO THIGH IMPRESSION: 1. Resolution of metabolic activity associated with the stomach. 2. Decrease in size of omental nodularity. No associated metabolic activity. 3. No evidence of new or progressive gastric carcinoma. 4. New peripheral consolidation in the RIGHT lower lobe with moderate metabolic activity. Favor focus of atelectasis versus less likely pneumonia. 5.  Aortic Atherosclerosis (ICD10-I70.0).  EXAM:12/16/21 CT CHEST, ABDOMEN, AND PELVIS WITH CONTRAST IMPRESSION: Response to therapy of gastric primary and adjacent omental nodularity. No new or progressive disease. Right larger than left lower lobe pulmonary nodules, felt to be similar to the 04/10/2016 remote CT and therefore benign. Progressive interstitial lung disease, right greater than left. This could be postinfectious/inflammatory or less likely represent nonspecific interstitial pneumonitis. Improvement in common duct dilation, likely incidental given chronicity.   EXAM:09/18/2021 NUCLEAR MEDICINE PET SKULL BASE TO THIGH IMPRESSION: 1. Evaluation of the gastric wall is limited by minimal distension, within this context, here is diffuse hypermetabolic activity within the stomach with similar diffuse nonspecific gastric wall thickening and no significant change in the appearance of the possible anterior gastric  body ulceration. 2. Mildly metabolic omental nodularity anterior and inferior to the stomach, likely reflects omental disease involvement. 3. No convincing evidence of hypermetabolic metastatic disease in the neck, chest, or pelvis. 4. Similar mild intrahepatic and moderate extrahepatic biliary ductal dilation with the common duct measuring 13 mm no suspicious hypermetabolic lesion identified within the duct and no discrete lesion identified on prior MRI dated July 20, 2021. 5.  Aortic Atherosclerosis (ICD10-I70.0).    I,Jasmine M Lassiter,acting as a scribe for Derwood Kaplan, MD.,have documented all relevant documentation on the behalf of Derwood Kaplan, MD,as directed by  Derwood Kaplan, MD while in the presence of Derwood Kaplan, MD.

## 2022-04-07 ENCOUNTER — Other Ambulatory Visit: Payer: Self-pay | Admitting: Oncology

## 2022-04-07 ENCOUNTER — Inpatient Hospital Stay: Payer: Medicare Other

## 2022-04-07 ENCOUNTER — Other Ambulatory Visit: Payer: Self-pay

## 2022-04-07 ENCOUNTER — Other Ambulatory Visit: Payer: Self-pay | Admitting: Pharmacist

## 2022-04-07 ENCOUNTER — Encounter: Payer: Self-pay | Admitting: Oncology

## 2022-04-07 ENCOUNTER — Inpatient Hospital Stay (INDEPENDENT_AMBULATORY_CARE_PROVIDER_SITE_OTHER): Payer: Medicare Other | Admitting: Oncology

## 2022-04-07 ENCOUNTER — Telehealth: Payer: Self-pay

## 2022-04-07 VITALS — BP 128/82 | HR 84 | Temp 97.9°F | Resp 20 | Ht 64.0 in | Wt 189.4 lb

## 2022-04-07 VITALS — BP 166/80 | HR 84 | Temp 98.1°F | Resp 18 | Ht 64.0 in | Wt 189.8 lb

## 2022-04-07 DIAGNOSIS — E032 Hypothyroidism due to medicaments and other exogenous substances: Secondary | ICD-10-CM

## 2022-04-07 DIAGNOSIS — R7989 Other specified abnormal findings of blood chemistry: Secondary | ICD-10-CM

## 2022-04-07 DIAGNOSIS — C168 Malignant neoplasm of overlapping sites of stomach: Secondary | ICD-10-CM | POA: Diagnosis not present

## 2022-04-07 DIAGNOSIS — D649 Anemia, unspecified: Secondary | ICD-10-CM | POA: Diagnosis not present

## 2022-04-07 DIAGNOSIS — E860 Dehydration: Secondary | ICD-10-CM

## 2022-04-07 DIAGNOSIS — Z79899 Other long term (current) drug therapy: Secondary | ICD-10-CM | POA: Diagnosis not present

## 2022-04-07 DIAGNOSIS — C786 Secondary malignant neoplasm of retroperitoneum and peritoneum: Secondary | ICD-10-CM | POA: Diagnosis not present

## 2022-04-07 DIAGNOSIS — C169 Malignant neoplasm of stomach, unspecified: Secondary | ICD-10-CM | POA: Diagnosis not present

## 2022-04-07 DIAGNOSIS — Z5111 Encounter for antineoplastic chemotherapy: Secondary | ICD-10-CM | POA: Diagnosis not present

## 2022-04-07 LAB — CBC AND DIFFERENTIAL
HCT: 37 (ref 36–46)
Hemoglobin: 12.8 (ref 12.0–16.0)
Neutrophils Absolute: 7.31
Platelets: 202 10*3/uL (ref 150–400)
WBC: 10.6

## 2022-04-07 LAB — HEPATIC FUNCTION PANEL
ALT: 15 U/L (ref 7–35)
AST: 24 (ref 13–35)
Alkaline Phosphatase: 70 (ref 25–125)
Bilirubin, Total: 0.5

## 2022-04-07 LAB — TSH: TSH: 1.895 u[IU]/mL (ref 0.350–4.500)

## 2022-04-07 LAB — COMPREHENSIVE METABOLIC PANEL
Albumin: 4.1 (ref 3.5–5.0)
Calcium: 9.4 (ref 8.7–10.7)
eGFR: 21

## 2022-04-07 LAB — BASIC METABOLIC PANEL
BUN: 46 — AB (ref 4–21)
CO2: 25 — AB (ref 13–22)
Chloride: 102 (ref 99–108)
Creatinine: 2.2 — AB (ref 0.5–1.1)
Glucose: 86
Potassium: 3.8 mEq/L (ref 3.5–5.1)
Sodium: 138 (ref 137–147)

## 2022-04-07 LAB — CBC: RBC: 4.26 (ref 3.87–5.11)

## 2022-04-07 MED ORDER — SODIUM CHLORIDE 0.9 % IV SOLN: Freq: Once | INTRAVENOUS | Status: AC

## 2022-04-07 MED ORDER — SODIUM CHLORIDE 0.9% FLUSH
10.0000 mL | Freq: Once | INTRAVENOUS | Status: AC | PRN
  Administered 2022-04-07: 10 mL

## 2022-04-07 MED ORDER — HEPARIN SOD (PORK) LOCK FLUSH 100 UNIT/ML IV SOLN
500.0000 [IU] | Freq: Once | INTRAVENOUS | Status: AC | PRN
  Administered 2022-04-07: 500 [IU]

## 2022-04-07 NOTE — Telephone Encounter (Signed)
Receiving 1 Lt  NS today and Friday.

## 2022-04-07 NOTE — Telephone Encounter (Signed)
-----   Message from Derwood Kaplan, MD sent at 04/07/2022 12:54 PM EST ----- Regarding: appts Saw her today but didn't have her CMP. Last week creat 1.6, new finding. Now 2.2 with BUN 46. She needs IV fluids, would like to give 1 L NS today or tomorrow and then repeat Friday or Monday.   I don't see her for 3 weeks so need to schedule f/u with [facility] next week to repeat labs. If still worse, will need a renal US.  I left message on her cell phone that we needed to speak with her. No answer at her home phone or daughter's phone

## 2022-04-07 NOTE — Patient Instructions (Signed)
Dehydration, Adult Dehydration is condition in which there is not enough water or other fluids in the body. This happens when a person loses more fluids than he or she takes in. Important body parts cannot work right without the right amount of fluids. Any loss of fluids from the body can cause dehydration. Dehydration can be mild, worse, or very bad. It should be treated right away to keep it from getting very bad. What are the causes? This condition may be caused by: Conditions that cause loss of water or other fluids, such as: Watery poop (diarrhea). Vomiting. Sweating a lot. Peeing (urinating) a lot. Not drinking enough fluids, especially when you: Are ill. Are doing things that take a lot of energy to do. Other illnesses and conditions, such as fever or infection. Certain medicines, such as medicines that take extra fluid out of the body (diuretics). Lack of safe drinking water. Not being able to get enough water and food. What increases the risk? The following factors may make you more likely to develop this condition: Having a long-term (chronic) illness that has not been treated the right way, such as: Diabetes. Heart disease. Kidney disease. Being 65 years of age or older. Having a disability. Living in a place that is high above the ground or sea (high in altitude). The thinner, dried air causes more fluid loss. Doing exercises that put stress on your body for a long time. What are the signs or symptoms? Symptoms of dehydration depend on how bad it is. Mild or worse dehydration Thirst. Dry lips or dry mouth. Feeling dizzy or light-headed, especially when you stand up from sitting. Muscle cramps. Your body making: Dark pee (urine). Pee may be the color of tea. Less pee than normal. Less tears than normal. Headache. Very bad dehydration Changes in skin. Skin may: Be cold to the touch (clammy). Be blotchy or pale. Not go back to normal right after you lightly pinch  it and let it go. Little or no tears, pee, or sweat. Changes in vital signs, such as: Fast breathing. Low blood pressure. Weak pulse. Pulse that is more than 100 beats a minute when you are sitting still. Other changes, such as: Feeling very thirsty. Eyes that look hollow (sunken). Cold hands and feet. Being mixed up (confused). Being very tired (lethargic) or having trouble waking from sleep. Short-term weight loss. Loss of consciousness. How is this treated? Treatment for this condition depends on how bad it is. Treatment should start right away. Do not wait until your condition gets very bad. Very bad dehydration is an emergency. You will need to go to a hospital. Mild or worse dehydration can be treated at home. You may be asked to: Drink more fluids. Drink an oral rehydration solution (ORS). This drink helps get the right amounts of fluids and salts and minerals in the blood (electrolytes). Very bad dehydration can be treated: With fluids through an IV tube. By getting normal levels of salts and minerals in your blood. This is often done by giving salts and minerals through a tube. The tube is passed through your nose and into your stomach. By treating the root cause. Follow these instructions at home: Oral rehydration solution If told by your doctor, drink an ORS: Make an ORS. Use instructions on the package. Start by drinking small amounts, about  cup (120 mL) every 5-10 minutes. Slowly drink more until you have had the amount that your doctor said to have. Eating and drinking          Drink enough clear fluid to keep your pee pale yellow. If you were told to drink an ORS, finish the ORS first. Then, start slowly drinking other clear fluids. Drink fluids such as: Water. Do not drink only water. Doing that can make the salt (sodium) level in your body get too low. Water from ice chips you suck on. Fruit juice that you have added water to (diluted). Low-calorie sports  drinks. Eat foods that have the right amounts of salts and minerals, such as: Bananas. Oranges. Potatoes. Tomatoes. Spinach. Do not drink alcohol. Avoid: Drinks that have a lot of sugar. These include: High-calorie sports drinks. Fruit juice that you did not add water to. Soda. Caffeine. Foods that are greasy or have a lot of fat or sugar. General instructions Take over-the-counter and prescription medicines only as told by your doctor. Do not take salt tablets. Doing that can make the salt level in your body get too high. Return to your normal activities as told by your doctor. Ask your doctor what activities are safe for you. Keep all follow-up visits as told by your doctor. This is important. Contact a doctor if: You have pain in your belly (abdomen) and the pain: Gets worse. Stays in one place. You have a rash. You have a stiff neck. You get angry or annoyed (irritable) more easily than normal. You are more tired or have a harder time waking than normal. You feel: Weak or dizzy. Very thirsty. Get help right away if you have: Any symptoms of very bad dehydration. Symptoms of vomiting, such as: You cannot eat or drink without vomiting. Your vomiting gets worse or does not go away. Your vomit has blood or green stuff in it. Symptoms that get worse with treatment. A fever. A very bad headache. Problems with peeing or pooping (having a bowel movement), such as: Watery poop that gets worse or does not go away. Blood in your poop (stool). This may cause poop to look black and tarry. Not peeing in 6-8 hours. Peeing only a small amount of very dark pee in 6-8 hours. Trouble breathing. These symptoms may be an emergency. Do not wait to see if the symptoms will go away. Get medical help right away. Call your local emergency services (911 in the [place] Do not drive yourself to the hospital. Summary Dehydration is a condition in which there is not enough water or other fluids  in the body. This happens when a person loses more fluids than he or she takes in. Treatment for this condition depends on how bad it is. Treatment should be started right away. Do not wait until your condition gets very bad. Drink enough clear fluid to keep your pee pale yellow. If you were told to drink an oral rehydration solution (ORS), finish the ORS first. Then, start slowly drinking other clear fluids. Take over-the-counter and prescription medicines only as told by your doctor. Get help right away if you have any symptoms of very bad dehydration. This information is not intended to replace advice given to you by your health care provider. Make sure you discuss any questions you have with your health care provider. Document Revised: 06/10/2021 Document Reviewed: 09/14/2018 Elsevier Patient Education  2023 [employer]  

## 2022-04-08 LAB — T4: T4, Total: 10.1 ug/dL (ref 4.5–12.0)

## 2022-04-09 ENCOUNTER — Inpatient Hospital Stay: Payer: Medicare Other

## 2022-04-09 VITALS — BP 166/70 | HR 80 | Temp 98.0°F | Resp 18 | Ht 64.0 in | Wt 189.8 lb

## 2022-04-09 DIAGNOSIS — Z5111 Encounter for antineoplastic chemotherapy: Secondary | ICD-10-CM | POA: Diagnosis not present

## 2022-04-09 DIAGNOSIS — C786 Secondary malignant neoplasm of retroperitoneum and peritoneum: Secondary | ICD-10-CM | POA: Diagnosis not present

## 2022-04-09 DIAGNOSIS — C169 Malignant neoplasm of stomach, unspecified: Secondary | ICD-10-CM | POA: Diagnosis not present

## 2022-04-09 DIAGNOSIS — Z79899 Other long term (current) drug therapy: Secondary | ICD-10-CM | POA: Diagnosis not present

## 2022-04-09 DIAGNOSIS — R7989 Other specified abnormal findings of blood chemistry: Secondary | ICD-10-CM

## 2022-04-09 DIAGNOSIS — E860 Dehydration: Secondary | ICD-10-CM

## 2022-04-09 MED ORDER — SODIUM CHLORIDE 0.9 % IV SOLN: Freq: Once | INTRAVENOUS | Status: AC

## 2022-04-09 NOTE — Patient Instructions (Signed)
Dehydration, Adult Dehydration is a condition in which there is not enough water or other fluids in the body. This happens when a person loses more fluids than they take in. Important organs cannot work right without the right amount of fluids. Any loss of fluids from the body can cause dehydration. Dehydration can be mild, worse, or very bad. It should be treated right away to keep it from getting very bad. What are the causes? Conditions that cause loss of water in the body. They include: Watery poop (diarrhea). Vomiting. Sweating a lot. Fever. Infection. Peeing (urinating) a lot. Not drinking enough fluids. Certain medicines, such as medicines that take extra fluid out of the body (diuretics). Lack of safe drinking water. Not being able to get enough water and food. What increases the risk? Having a long-term (chronic) illness that has not been treated the right way, such as: Diabetes. Heart disease. Kidney disease. Being 67 years of age or older. Having a disability. Living in a place that is high above the ground or sea (high in altitude). The thinner, drier air causes more fluid loss. Doing exercises that put stress on your body for a long time. Being active when in hot places. What are the signs or symptoms? Symptoms of dehydration depend on how bad it is. Mild or worse dehydration Thirst. Dry lips or dry mouth. Feeling dizzy or light-headed. Muscle cramps. Passing little pee or dark pee. Pee may be the color of tea. Headache. Very bad dehydration Changes in skin. Skin may: Be cold to the touch (clammy). Be blotchy or pale. Not go back to normal right after you pinch it and let it go. Little or no tears, pee, or sweat. Fast breathing. Low blood pressure. Weak pulse. Pulse that is more than 100 beats a minute when you are sitting still. Other changes, such as: Feeling very thirsty. Eyes that look hollow (sunken). Cold hands and feet. Being confused. Being very  tired (lethargic) or having trouble waking from sleep. Losing weight. Loss of consciousness. How is this treated? Treatment for this condition depends on how bad your dehydration is. Treatment should start right away. Do not wait until your condition gets very bad. Very bad dehydration is an emergency. You will need to go to a hospital. Mild or worse dehydration can be treated at home. You may be asked to: Drink more fluids. Drink an oral rehydration solution (ORS). This drink gives you the right amount of fluids, salts, and minerals (electrolytes). Very bad dehydration can be treated: With fluids through an IV tube. By correcting low levels of electrolytes in the body. By treating the problem that caused your dehydration. Follow these instructions at home: Oral rehydration solution If told by your doctor, drink an ORS: Make an ORS. Use instructions on the package. Start by drinking small amounts, about  cup (120 mL) every 5-10 minutes. Slowly drink more until you have had the amount that your doctor said to have.  Eating and drinking  Drink enough clear fluid to keep your pee pale yellow. If you were told to drink an ORS, finish the ORS first. Then, start slowly drinking other clear fluids. Drink fluids such as: Water. Do not drink only water. Doing that can make the salt (sodium) level in your body get too low. Water from ice chips you suck on. Fruit juice that you have added water to (diluted). Low-calorie sports drinks. Eat foods that have the right amounts of salts and minerals, such as bananas, oranges, potatoes,  tomatoes, or spinach. Do not drink alcohol. Avoid drinks that have caffeine or sugar. These include:: High-calorie sports drinks. Fruit juice that you did not add water to. Soda. Coffee or energy drinks. Avoid foods that are greasy or have a lot of fat or sugar. General instructions Take over-the-counter and prescription medicines only as told by your doctor. Do  not take sodium tablets. Doing that can make the salt level in your body get too high. Return to your normal activities as told by your doctor. Ask your doctor what activities are safe for you. Keep all follow-up visits. Your doctor may check and change your treatment. Contact a doctor if: You have pain in your belly (abdomen) and the pain: Gets worse. Stays in one place. You have a rash. You have a stiff neck. You get angry or annoyed more easily than normal. You are more tired or have a harder time waking than normal. You feel weak or dizzy. You feel very thirsty. Get help right away if: You have any symptoms of very bad dehydration. You vomit every time you eat or drink. Your vomiting gets worse, does not go away, or you vomit blood or green stuff. You are getting treatment, but symptoms are getting worse. You have a fever. You have a very bad headache. You have: Diarrhea that gets worse or does not go away. Blood in your poop (stool). This may cause poop to look black and tarry. No pee in 6-8 hours. Only a small amount of pee in 6-8 hours, and the pee is very dark. You have trouble breathing. These symptoms may be an emergency. Get help right away. Call 911. Do not wait to see if the symptoms will go away. Do not drive yourself to the hospital. This information is not intended to replace advice given to you by your health care provider. Make sure you discuss any questions you have with your health care provider. Document Revised: 08/31/2021 Document Reviewed: 08/31/2021 Elsevier Patient Education  [place].

## 2022-04-09 NOTE — Progress Notes (Addendum)
[facility]  [address] [place],    [address] [phone]  Clinic Day: 04/12/22   Referring physician: Ernestene Kiel, MD   ASSESSMENT & PLAN:  Gastric adenocarcinoma This is a poorly differentiated adenocarcinoma with signet ring features and ulceration. Stain for HER2 was negative at 0. I would consider it as metastatic to the omentum and so that would stage this as a T4 N0 M1, stage IVB.  She has been on treatment with chemotherapy with FOLFOX and nivolumab for 10 cycles with very good response, but then had increasing dermatologic toxicities and neuropathy. Her PET scan from 03/22/2022 shows a new area of hypermetabolic activity in the right lower lobe of peripheral consolidation, which is more likely atelectasis or resolving pneumonia measuring 3cm. The thickening of her stomach has decreased with decreased metabolic activity and there is shrinkage of the omental metastasis from 33m to 162m The last time she received chemotherapy alone she had severe rash and fever and was admitted to the hospital, but she was also found to have pneumonia and a UTI at that time. We are not sure which medication caused her rashes but she tolerated her immunotherapy last week without a problem.    Renal Dysfunction Her creatinine was elevated at 1.62 last time and she was given 1 liter of normal saline and encouraged to drink more fluids. However, today it is up to 2.2 with a Bun of 46 and is likely prerenal. I will therefore bring her in for IV fluids and we will need to recheck this next week. If it continues to worsen we will need to do a ultrasound of her kidneys.  Peripheral neuropathy The neuropathy in her fingers and toes has gotten worse even after we stopped Oxaliplatin   Plan The neuropathy in her fingers and toes has gotten worse even after we stopped Oxaliplatin. Her BUN is at  33 with a creatinine to 2.0 today. 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 We will stop her immunotherapy for now and schedule her an ultrasound as soon as possible to rule out any other pathology. If her ultrasound result is negative I will prescribe Prednisone '20mg'$  BID.  Her endoscopy is scheduled for 04/15/2022 to reevaluate her gastric cancer. She will keep her lab appointment on 04/15/2022 and [name] back in a week with labs and cancel her infusion appointment. I reviewed all of this with her and her family and answered their questions. She understands and agrees with this plan.   I provided 45 minutes of face-to-face time during this this encounter and > 50% was spent counseling as documented under my assessment and plan.   ChDerwood KaplanMD COSpicewood Surgery CenterT ASThe Pavilion At Williamsburg Place[address] StreetTMoberlyCAlaska[address]ept: [phone] Fax: [phone]  Management of grade 2 (creatinine 2 to 3 times above baseline) nephritis from immunotherapy:   - Hold immunotherapy temporarily.   - Consult nephrology.   - Evaluate for other causes (recent IV contrast, medications, and fluid status). If other etiologies are ruled out, administer 0.5 to 1 mg/kg/day prednisone equivalents.   - If worsening or no improvement after 1 week, increase to 1 to 2 mg/kg/day prednisone equivalents and permanently discontinue immunotherapy   - If improved to less than or equal to G1, taper steroids over at least 4 weeks.   -  If no recurrence of CRI discuss resumption of immunotherapy with patient after taking into account the risks and benefits. Resumption of immunotherapy can be considered once steroids have been successfully tapered to less than or equal to 10 mg/day or discontinued.        CHIEF COMPLAINT:  CC: Gastric cancer  Current Treatment: Chemotherapy/immunotherapy   HISTORY OF PRESENT ILLNESS:  Mackenzie Lewis  is a 87 y.o. female with a history of gastric cancer who is referred in consultation with Dr. Sandria Senter for assessment and management.  She had noticed that she was having regurgitation when eating and had lost over 30 pounds.  An ultrasound was done, revealing hepatic steatosis, and led to an MRI scan on June 5 which revealed gastric wall thickening with confluent nodularity of the omentum anterior to the stomach measuring 3.5 cm consistent with metastatic tumor.  She also had low-grade edema and wall thickening extending into the duodenum from the stomach.  She was referred to Dr. Sandria Senter and he did an EGD on July 7.  This revealed a large ulceration measuring 1.2 cm along the greater curvature.  She also had diffusely edematous and erythematous wall with erosions of the antrum and stiff and friable mucosa with oozing of blood.  These findings extended to the gastric fundus as well.  Pathology revealed a poorly differentiated adenocarcinoma with signet ring features from the biopsies of the ulcer as well as the antrum and the fundus of the stomach.  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  She was placed on omeprazole.  Her test for H. pylori was negative.  She was referred to Dr. Kendell Bane for consideration of surgery but he felt this was not resectable because of the extensive involvement and I agree.  I would consider this extending to the duodenum and also metastatic to the omentum.  PET scan confirmed these findings and she wished to pursue systemic intravenous therapy.  She continues to eat and has adjusted her diet to softer foods and liquids and is drinking boost so she has maintained her weight.  She does have some Zofran ODT for nausea when needed.  She has had some anorexia, nausea, occasional vomiting, early satiety, dysphagia, and belching.  A CEA and CA 19-9 were normal. She has been started on FOLFOX chemotherapy along with  immunotherapy.  I have reviewed her chart and materials related to her cancer extensively and collaborated history with the patient. Summary of oncologic history is as follows: Oncology History  Gastric cancer ([place])  09/10/2021 Initial Diagnosis   Gastric cancer ([place])   09/10/2021 Cancer Staging   Staging form: Stomach, AJCC 8th Edition - Clinical stage from 09/10/2021: Stage IVB (cT4b, cN0, cM1) - Signed by Derwood Kaplan, MD on 09/10/2021 Histopathologic type: Adenocarcinoma, NOS Stage prefix: Initial diagnosis Total positive nodes: 0 Histologic grade (G): G3 Histologic grading system: 3 grade system Sites of metastasis: Peritoneal surface Diagnostic confirmation: Positive histology PLUS positive immunophenotyping and/or positive genetic studies Specimen type: Endoscopy with Biopsy Staged by: Managing physician Carcinoembryonic antigen (CEA) (ng/mL): 2.8 Carbohydrate antigen 19-9 (CA 19-9) (U/mL): 4.9 HER2 status: Unknown Microsatellite instability (MSI): Unknown Tumor location in stomach: Other Clinical staging modalities: Biopsy, Endoscopy Stage used in treatment planning: Yes National guidelines used in treatment planning: Yes Type of national guideline used in treatment planning: NCCN   09/28/2021 - 10/14/2021 Chemotherapy   Patient is on Treatment Plan : GASTROESOPHAGEAL FOLFOX + Nivolumab q14d  09/28/2021 -  Chemotherapy   Patient is on Treatment Plan : GASTROESOPHAGEAL FOLFOX + Nivolumab q14d     12/09/2021 Genetic Testing   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  Report date is 12/09/2021.   The Multi-Cancer + RNA Panel offered by [facility] includes sequencing and/or deletion/duplication analysis of the following 84 genes:  AIP*, ALK, APC*, ATM*, AXIN2*, BAP1*, BARD1*, BLM*, BMPR1A*, BRCA1*, BRCA2*, BRIP1*, CASR, CDC73*, CDH1*, CDK4, CDKN1B*, CDKN1C*, CDKN2A, CEBPA, CHEK2*, CTNNA1*, DICER1*, DIS3L2*, EGFR, EPCAM, FH*,  FLCN*, GATA2*, GPC3, GREM1, HOXB13, HRAS, KIT, MAX*, MEN1*, MET, MITF, MLH1*, MSH2*, MSH3*, MSH6*, MUTYH*, NBN*, NF1*, NF2*, NTHL1*, PALB2*, PDGFRA, PHOX2B, PMS2*, POLD1*, POLE*, POT1*, PRKAR1A*, PTCH1*, PTEN*, RAD50*, RAD51C*, RAD51D*, RB1*, RECQL4, RET, RUNX1*, SDHA*, SDHAF2*, SDHB*, SDHC*, SDHD*, SMAD4*, SMARCA4*, SMARCB1*, SMARCE1*, STK11*, SUFU*, TERC, TERT, TMEM127*, Tp53*, TSC1*, TSC2*, VHL*, WRN*, and WT1.  RNA analysis is performed for * genes.     INTERVAL HISTORY:  Mackenzie Lewis is here today for a follow-up for her gastric cancer. Patient states that she feels fine today and complains that the neuropathy in her fingers and toes has gotten worse even after we stopped Oxaliplatin. She received IV fluids on  04/07/2022 and 04/09/2022 after her immunotherapy treatment due to an increase of her BUN to 46 with a creatinine at 2.2 on 04/07/2022 which is a cause for concern and likely indicates immune mediated nephritis. I advised her we may need to do a ultrasound to evaluate the kidneys. Her BUN came back today at 14 with a creatinine to 2.0, just slightly improved after IV fluids last week. We will stop her immunotherapy for now and schedule her an ultrasound as soon as possible. If her ultrasound result is negative, I will prescribe Prednisone '20mg'$  BID and I informed her of the side effects that steroids may have on her body. The patient has been trying to increase her water intake. Her endoscopy is scheduled for 04/15/2022. She informed me that she now has a slow urine stream with no hematuria or dysuria. She also takes MiraLax every day and a stool softener to help with constipation. I agreed with her idea to take magnesium citrate as needed and only take half a bottle. She will keep her lab appointment on 04/15/2022 and [name] back in a week with labs and cancel her infusion appointment. She denies signs of infection such as sore throat, sinus drainage, cough, or urinary symptoms.  She denies fevers  or recurrent chills. She denies pain. She denies nausea, vomiting, chest pain, dyspnea or cough. Her appetite is good and her weight has decreased 1 pounds over last week .  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  If that happens, she is to call us so that we can schedule her for an ultrasound to look for synovitis.     Essential hypertension 09/09/2016   Gastric cancer ([place]) 09/10/2021   Hyperlipidemia 09/01/2016   Hypertension    Hypothyroidism 09/01/2016  Osteoarthritis of both feet 01/20/2016   Osteoarthritis, hand 01/20/2016   Thyroid disease   Degenerative disc disease History of endometriosis  Past Surgical History:  Procedure Laterality Date   APPENDECTOMY     LAPAROSCOPIC APPENDECTOMY N/A 04/10/2016   Procedure: APPENDECTOMY LAPAROSCOPIC;  Surgeon: Coralie Keens, MD;  Location: [facility];  Service: General;  Laterality: N/A;  Bilateral tubal ligation Total hysterectomy and bilateral salpingo-oophorectomy in 1980  Family History  Problem Relation Age of Onset   Hypertension Mother    Prostate cancer Father        metastatic; d. 74   Brain cancer Sister 82   Breast cancer Sister 55   AAA (abdominal aortic aneurysm) Brother    Leukemia Cousin        x2 maternal female cousins; d. before 76   Breast cancer Daughter 73       DCIS  Her sister has had breast cancer as well as a tumor of her head Her daughter has had breast cancer last  year  Social History:  reports that she has never smoked. She has never used smokeless tobacco. She reports that she does not currently use alcohol. She reports that she does not use drugs.The patient is accompanied by her son, daughter-in-law and daughter today.  She is single but lives with a significant other.  She has the 2 children.  She worked in [employer] research (medical) and denies any chemical or toxin exposures.  She grew up in [place] and [place].  She is active and healthy, especially for her age.  Allergies:  Allergies  Allergen Reactions   Doxycycline Rash   Sulfa Antibiotics Rash and Other (See Comments)    Other reaction(s): Other (See Comments)  "Made me feel weird"    Current Medications: Current Outpatient Medications  Medication Sig Dispense Refill   aspirin 81 MG EC tablet Take 81 mg by mouth daily. Swallow whole.     Calcium Carbonate (CALCIUM 600 PO) Take 1 tablet by mouth daily.     Cholecalciferol (VITAMIN D3) 5000 units CAPS Take 1 capsule by mouth daily.     EXFORGE HCT 5-160-12.5 MG TABS Take 1 tablet by mouth daily.     famotidine (PEPCID) 40 MG tablet Take 40 mg by mouth at bedtime.     KRILL OIL PO Take 1 capsule by mouth daily. Unknown strenght     Lactobacillus TABS Take 1 tablet by mouth 2 (two) times daily.     levothyroxine (SYNTHROID, LEVOTHROID) 75 MCG tablet Take 75 mcg by mouth daily before breakfast.      LORazepam (ATIVAN) 0.5 MG tablet Take 1 tablet (0.5 mg total) by mouth at bedtime. 30 tablet 0   magic mouthwash SOLN SWISH AND SWALLOW 5ML BY MOUTH EVERY THREE TO FOUR HOURS     NON FORMULARY MMW: 3 parts Maalox 2 parts Benadryl 1 part viscious lidicaine  Disp. 6oz  Instructions: 64m swish and swallow every 3-4 hours     omeprazole (PRILOSEC) 40 MG capsule Take 1 capsule (40 mg total) by mouth 2 (two) times daily. 60 capsule 5   ondansetron (ZOFRAN) 4 MG tablet Take 4 mg by mouth every 4 (four) hours as needed.     ondansetron (ZOFRAN-ODT) 4 MG  disintegrating tablet Take 1 tablet (4 mg total) by mouth every 8 (eight) hours as needed for nausea or vomiting. 90 tablet 0   polyethylene glycol powder (GLYCOLAX/MIRALAX) 17 GM/SCOOP powder SMARTSIG:1 scoopful By Mouth Daily     potassium chloride SA (KLOR-CON  M) 20 MEQ tablet Take 1 tablet (20 mEq total) by mouth daily. 30 tablet 5   pravastatin (PRAVACHOL) 20 MG tablet Take 1 tablet (20 mg total) by mouth every evening. 90 tablet 3   predniSONE (DELTASONE) 10 MG tablet Take 2 tablets (20 mg total) by mouth 2 (two) times daily with a meal. 100 tablet 1   Probiotic Product (PROBIOTIC DAILY PO) Take 1 tablet by mouth daily.     prochlorperazine (COMPAZINE) 10 MG tablet Take 1 tablet (10 mg total) by mouth every 6 (six) hours as needed for nausea or vomiting. 90 tablet 3   triamcinolone 0.1% oint-Eucerin equivalent cream 1:1 mixture Apply topically 2 (two) times daily. 160 g 0   triamcinolone cream (KENALOG) 0.1 % Apply to affected skin twice daily (Patient taking differently: daily as needed. Apply to affected skin twice daily) 160 g 0   No current facility-administered medications for this visit.    REVIEW OF SYSTEMS:  Review of Systems  Constitutional: Negative.  Negative for appetite change, chills, diaphoresis, fatigue, fever and unexpected weight change.  HENT:  Negative.  Negative for hearing loss, lump/mass, mouth sores, nosebleeds, sore throat, tinnitus, trouble swallowing and voice change.   Eyes: Negative.  Negative for eye problems and icterus.  Respiratory: Negative.  Negative for chest tightness, cough, hemoptysis, shortness of breath and wheezing.   Cardiovascular: Negative.  Negative for chest pain, leg swelling and palpitations.  Gastrointestinal:  Positive for constipation. Negative for abdominal distention, abdominal pain, blood in stool, diarrhea, nausea, rectal pain and vomiting.  Endocrine: Negative.  Negative for hot flashes.  Genitourinary: Negative.  Negative for  bladder incontinence, difficulty urinating, dyspareunia, dysuria, frequency, hematuria, menstrual problem, nocturia, pelvic pain, vaginal bleeding and vaginal discharge.        Slow urine stream.  Musculoskeletal: Negative.  Negative for arthralgias, back pain, flank pain, gait problem, myalgias, neck pain and neck stiffness.  Skin: Negative.  Negative for itching, rash and wound.       Persistent scars of the lower extremities at previous rash site.  Neurological:  Positive for light-headedness and numbness (in her fingers and toes). Negative for dizziness, extremity weakness, gait problem, headaches, seizures and speech difficulty.  Hematological: Negative.  Negative for adenopathy. Does not bruise/bleed easily.  Psychiatric/Behavioral: Negative.  Negative for confusion, decreased concentration, depression, sleep disturbance and suicidal ideas. The patient is not nervous/anxious.     VITALS:  Blood pressure (!) 158/81, pulse 90, temperature 97.6 F (36.4 C), temperature source Oral, resp. rate 18, height '5\' 4"'$  (1.626 m), weight 188 lb 4.8 oz (85.4 kg), SpO2 98 %.  Wt Readings from Last 3 Encounters:  04/20/22 187 lb (84.8 kg)  04/19/22 189 lb (85.7 kg)  04/16/22 187 lb 12 oz (85.2 kg)    Body mass index is 32.32 kg/m.  Performance status (ECOG): 1 - Symptomatic but completely ambulatory  PHYSICAL EXAM:  Physical Exam Vitals and nursing note reviewed. Exam conducted with a chaperone present.  Constitutional:      General: She is not in acute distress.    Appearance: Normal appearance. She is normal weight. She is not ill-appearing, toxic-appearing or diaphoretic.  HENT:     Head: Normocephalic and atraumatic.     Right Ear: Tympanic membrane, ear canal and external ear normal. There is no impacted cerumen.     Left Ear: Tympanic membrane, ear canal and external ear normal. There is no impacted cerumen.     Nose: Nose normal. No congestion or  rhinorrhea.     Mouth/Throat:      Mouth: Mucous membranes are moist.     Pharynx: Oropharynx is clear. No oropharyngeal exudate or posterior oropharyngeal erythema.  Eyes:     General: No scleral icterus.       Right eye: No discharge.        Left eye: No discharge.     Extraocular Movements: Extraocular movements intact.     Conjunctiva/sclera: Conjunctivae normal.     Pupils: Pupils are equal, round, and reactive to light.  Neck:     Vascular: No carotid bruit.  Cardiovascular:     Rate and Rhythm: Normal rate and regular rhythm.     Pulses: Normal pulses.     Heart sounds: Normal heart sounds. No murmur heard.    No friction rub. No gallop.  Pulmonary:     Effort: Pulmonary effort is normal. No respiratory distress.     Breath sounds: Normal breath sounds. No stridor. No wheezing, rhonchi or rales.  Chest:     Chest wall: No tenderness.  Abdominal:     General: Bowel sounds are normal. There is no distension.     Palpations: Abdomen is soft. There is no hepatomegaly, splenomegaly or mass.     Tenderness: There is no abdominal tenderness. There is no right CVA tenderness, left CVA tenderness, guarding or rebound.     Hernia: No hernia is present.     Comments: She still has mild firmness across the upper mid abdomen.   Musculoskeletal:        General: No swelling, tenderness, deformity or signs of injury. Normal range of motion.     Cervical back: Normal range of motion and neck supple. No rigidity or tenderness.     Right lower leg: No edema (trace).     Left lower leg: No edema (trace).  Lymphadenopathy:     Cervical: No cervical adenopathy.     Right cervical: No superficial, deep or posterior cervical adenopathy.    Left cervical: No superficial, deep or posterior cervical adenopathy.     Upper Body:     Right upper body: No supraclavicular, axillary or pectoral adenopathy.     Left upper body: No supraclavicular, axillary or pectoral adenopathy.  Skin:    General: Skin is warm and dry.     Coloration:  Skin is not jaundiced or pale.     Findings: No bruising, erythema, lesion or rash.     Comments: She has persistent scars on the lower extremities.   Neurological:     General: No focal deficit present.     Mental Status: She is alert and oriented to person, place, and time. Mental status is at baseline.     Cranial Nerves: No cranial nerve deficit.     Sensory: No sensory deficit.     Motor: No weakness.     Coordination: Coordination normal.     Gait: Gait normal.     Deep Tendon Reflexes: Reflexes normal.  Psychiatric:        Mood and Affect: Mood normal.        Behavior: Behavior normal.        Thought Content: Thought content normal.        Judgment: Judgment normal.    LABS:      Latest Ref Rng & Units 04/19/2022   12:00 AM 04/15/2022    8:56 AM 04/12/2022   12:00 AM  CBC  WBC  16.5     12.8  9.7      Hemoglobin 12.0 - 16.0 13.0     12.6  13.3      Hematocrit 36 - 46 38     36.8  38      Platelets 150 - 400 K/uL 260     215  197         This result is from an external source.      Latest Ref Rng & Units 04/19/2022   12:00 AM 04/15/2022    8:56 AM 04/12/2022   12:00 AM  CMP  Glucose 70 - 99 mg/dL  98    BUN 4 - 21 39     41  33      Creatinine 0.5 - 1.1 1.6     2.21  2.0      Sodium 137 - 147 137     135  139      Potassium 3.5 - 5.1 mEq/L 3.9     3.3  3.5      Chloride 99 - 108 105     100  104      CO2 13 - '22 23     25  24      '$ Calcium 8.7 - 10.7 9.5     9.1  9.3      Total Protein 6.5 - 8.1 g/dL  7.7    Total Bilirubin 0.3 - 1.2 mg/dL  0.5    Alkaline Phos 25 - 125 80     63  61      AST 13 - 35 '23     19  23      '$ ALT 7 - 35 U/L '21     14  18         '$ This result is from an external source.   Lab Results  Component Value Date   CEA1 2.9 09/10/2021   /  CEA  Date Value Ref Range Status  09/10/2021 2.9 0.0 - 4.7 ng/mL Final    Comment:    (NOTE)                             Nonsmokers          <3.9                             Smokers              <5.6 [employer] Electrochemiluminescence Immunoassay (ECLIA) Values obtained with different assay methods or kits cannot be used interchangeably.  Results cannot be interpreted as absolute evidence of the presence or absence of malignant disease. Performed At: [facility] [place], [place] [id number] Rush Farmer MD [id number]    Component Ref Range & Units 03/22/2022 6 mo ago  Glucose-Capillary 70 - 99 mg/dL 96 124 High  CM   Component Ref Range & Units 2 d ago (04/07/22) 2 wk ago (03/24/22) 1 mo ago (02/16/22) 2 mo ago (01/28/22) 2 mo ago (01/14/22) 3 mo ago (12/31/21) 4 mo ago (12/03/21)  TSH 0.350 - 4.500 uIU/mL 1.895 1.835 CM 2.087 CM 1.773 CM 2.604 CM 1.987 CM 1.657 CM     Component Ref Range & Units 2 d ago (04/07/22) 2 wk ago (03/24/22) 1 mo ago (02/16/22) 2 mo ago (01/28/22) 2 mo ago (01/14/22) 3 mo ago (12/31/21) 4 mo ago (12/03/21)  T4, Total 4.5 -  12.0 ug/dL 10.1 10.5 CM 12.5 High  CM 14.6 High  CM 13.2 High  CM 12.3 High  CM 13.4 High  CM     No results found for: "PSA1" No results found for: "[id number]" No results found for: "CAN125"  No results found for: "TOTALPROTELP", "ALBUMINELP", "A1GS", "A2GS", "BETS", "BETA2SER", "GAMS", "MSPIKE", "SPEI" No results found for: "TIBC", "FERRITIN", "IRONPCTSAT" No results found for: "LDH"  STUDIES:  No results found.  EXAM: 03/22/2022 NUCLEAR MEDICINE PET SKULL BASE TO THIGH IMPRESSION: 1. Resolution of metabolic activity associated with the stomach. 2. Decrease in size of omental nodularity. No associated metabolic activity. 3. No evidence of new or progressive gastric carcinoma. 4. New peripheral consolidation in the RIGHT lower lobe with moderate metabolic activity. Favor focus of atelectasis versus less likely pneumonia. 5.  Aortic Atherosclerosis (ICD10-I70.0).  EXAM:12/16/21 CT CHEST, ABDOMEN, AND PELVIS WITH CONTRAST IMPRESSION: Response to therapy of gastric primary and  adjacent omental nodularity. No new or progressive disease. Right larger than left lower lobe pulmonary nodules, felt to be similar to the 04/10/2016 remote CT and therefore benign. Progressive interstitial lung disease, right greater than left. This could be postinfectious/inflammatory or less likely represent nonspecific interstitial pneumonitis. Improvement in common duct dilation, likely incidental given chronicity.   EXAM:09/18/2021 NUCLEAR MEDICINE PET SKULL BASE TO THIGH NUCLEAR MEDICINE PET SKULL BASE TO THIGH IMPRESSION: 1. Evaluation of the gastric wall is limited by minimal distension, within this context, here is diffuse hypermetabolic activity within the stomach with similar diffuse nonspecific gastric wall thickening and no significant change in the appearance of the possible anterior gastric body ulceration. 2. Mildly metabolic omental nodularity anterior and inferior to the stomach, likely reflects omental disease involvement. 3. No convincing evidence of hypermetabolic metastatic disease in the neck, chest, or pelvis. 4. Similar mild intrahepatic and moderate extrahepatic biliary ductal dilation with the common duct measuring 13 mm no suspicious hypermetabolic lesion identified within the duct and no discrete lesion identified on prior MRI dated July 20, 2021. 5.  Aortic Atherosclerosis (ICD10-I70.0).    I,Jasmine M Lassiter,acting as a scribe for Derwood Kaplan, MD.,have documented all relevant documentation on the behalf of Derwood Kaplan, MD,as directed by  Derwood Kaplan, MD while in the presence of Derwood Kaplan, MD.

## 2022-04-12 ENCOUNTER — Encounter: Payer: Self-pay | Admitting: Oncology

## 2022-04-12 ENCOUNTER — Inpatient Hospital Stay: Payer: Medicare Other

## 2022-04-12 ENCOUNTER — Inpatient Hospital Stay: Payer: Medicare Other | Admitting: Oncology

## 2022-04-12 ENCOUNTER — Other Ambulatory Visit: Payer: Self-pay | Admitting: Oncology

## 2022-04-12 VITALS — BP 158/81 | HR 90 | Temp 97.6°F | Resp 18 | Ht 64.0 in | Wt 188.3 lb

## 2022-04-12 DIAGNOSIS — C786 Secondary malignant neoplasm of retroperitoneum and peritoneum: Secondary | ICD-10-CM | POA: Diagnosis not present

## 2022-04-12 DIAGNOSIS — C168 Malignant neoplasm of overlapping sites of stomach: Secondary | ICD-10-CM

## 2022-04-12 DIAGNOSIS — Z79899 Other long term (current) drug therapy: Secondary | ICD-10-CM | POA: Diagnosis not present

## 2022-04-12 DIAGNOSIS — N182 Chronic kidney disease, stage 2 (mild): Secondary | ICD-10-CM | POA: Diagnosis not present

## 2022-04-12 DIAGNOSIS — C169 Malignant neoplasm of stomach, unspecified: Secondary | ICD-10-CM | POA: Diagnosis not present

## 2022-04-12 DIAGNOSIS — N2889 Other specified disorders of kidney and ureter: Secondary | ICD-10-CM | POA: Diagnosis not present

## 2022-04-12 DIAGNOSIS — Z5111 Encounter for antineoplastic chemotherapy: Secondary | ICD-10-CM | POA: Diagnosis not present

## 2022-04-12 LAB — URINALYSIS, COMPLETE (UACMP) WITH MICROSCOPIC
Bacteria, UA: NONE SEEN
Bilirubin Urine: NEGATIVE
Glucose, UA: NEGATIVE mg/dL
Hgb urine dipstick: NEGATIVE
Ketones, ur: NEGATIVE mg/dL
Nitrite: NEGATIVE
Protein, ur: NEGATIVE mg/dL
Specific Gravity, Urine: 1.012 (ref 1.005–1.030)
pH: 5 (ref 5.0–8.0)

## 2022-04-12 LAB — HEPATIC FUNCTION PANEL
ALT: 18 U/L (ref 7–35)
AST: 23 (ref 13–35)
Alkaline Phosphatase: 61 (ref 25–125)
Bilirubin, Total: 0.6

## 2022-04-12 LAB — BASIC METABOLIC PANEL
BUN: 33 — AB (ref 4–21)
CO2: 24 — AB (ref 13–22)
Chloride: 104 (ref 99–108)
Creatinine: 2 — AB (ref 0.5–1.1)
Glucose: 170
Potassium: 3.5 mEq/L (ref 3.5–5.1)
Sodium: 139 (ref 137–147)

## 2022-04-12 LAB — CBC AND DIFFERENTIAL
HCT: 38 (ref 36–46)
Hemoglobin: 13.3 (ref 12.0–16.0)
Neutrophils Absolute: 6.89
Platelets: 197 10*3/uL (ref 150–400)
WBC: 9.7

## 2022-04-12 LAB — COMPREHENSIVE METABOLIC PANEL
Albumin: 4.3 (ref 3.5–5.0)
Calcium: 9.3 (ref 8.7–10.7)

## 2022-04-12 LAB — CBC: RBC: 4.41 (ref 3.87–5.11)

## 2022-04-13 ENCOUNTER — Telehealth: Payer: Self-pay

## 2022-04-13 NOTE — Telephone Encounter (Signed)
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 I will order prednisone 20 mg bid, would like her to get it started today. Will later taper dose

## 2022-04-13 NOTE — Telephone Encounter (Signed)
Patient notified of results and to start the Prednisone.   Office visit instead of the endoscopy is scheduled for Thursday. Then Endoscopy will be scheduled.   Patient states it is not at the pharmacy. I do not see it on the medication list yet. Please send to [facility].

## 2022-04-14 ENCOUNTER — Encounter: Payer: Self-pay | Admitting: Oncology

## 2022-04-14 ENCOUNTER — Other Ambulatory Visit: Payer: Self-pay | Admitting: Oncology

## 2022-04-14 DIAGNOSIS — N142 Nephropathy induced by unspecified drug, medicament or biological substance: Secondary | ICD-10-CM | POA: Insufficient documentation

## 2022-04-14 MED ORDER — PREDNISONE 10 MG PO TABS: 20.0000 mg | ORAL_TABLET | Freq: Two times a day (BID) | ORAL | 1 refills | Status: DC

## 2022-04-15 ENCOUNTER — Inpatient Hospital Stay: Payer: Medicare Other

## 2022-04-15 DIAGNOSIS — C168 Malignant neoplasm of overlapping sites of stomach: Secondary | ICD-10-CM

## 2022-04-15 DIAGNOSIS — C169 Malignant neoplasm of stomach, unspecified: Secondary | ICD-10-CM | POA: Diagnosis not present

## 2022-04-15 DIAGNOSIS — K5900 Constipation, unspecified: Secondary | ICD-10-CM | POA: Diagnosis not present

## 2022-04-15 DIAGNOSIS — C786 Secondary malignant neoplasm of retroperitoneum and peritoneum: Secondary | ICD-10-CM | POA: Diagnosis not present

## 2022-04-15 DIAGNOSIS — K219 Gastro-esophageal reflux disease without esophagitis: Secondary | ICD-10-CM | POA: Diagnosis not present

## 2022-04-15 DIAGNOSIS — Z5111 Encounter for antineoplastic chemotherapy: Secondary | ICD-10-CM | POA: Diagnosis not present

## 2022-04-15 DIAGNOSIS — Z79899 Other long term (current) drug therapy: Secondary | ICD-10-CM | POA: Diagnosis not present

## 2022-04-15 LAB — CMP (CANCER CENTER ONLY)
ALT: 14 U/L (ref 0–44)
AST: 19 U/L (ref 15–41)
Albumin: 4.1 g/dL (ref 3.5–5.0)
Alkaline Phosphatase: 63 U/L (ref 38–126)
Anion gap: 10 (ref 5–15)
BUN: 41 mg/dL — ABNORMAL HIGH (ref 8–23)
CO2: 25 mmol/L (ref 22–32)
Calcium: 9.1 mg/dL (ref 8.9–10.3)
Chloride: 100 mmol/L (ref 98–111)
Creatinine: 2.21 mg/dL — ABNORMAL HIGH (ref 0.44–1.00)
GFR, Estimated: 21 mL/min — ABNORMAL LOW (ref >60)
Glucose, Bld: 98 mg/dL (ref 70–99)
Potassium: 3.3 mmol/L — ABNORMAL LOW (ref 3.5–5.1)
Sodium: 135 mmol/L (ref 135–145)
Total Bilirubin: 0.5 mg/dL (ref 0.3–1.2)
Total Protein: 7.7 g/dL (ref 6.5–8.1)

## 2022-04-15 LAB — CBC WITH DIFFERENTIAL (CANCER CENTER ONLY)
Abs Immature Granulocytes: 0.05 10*3/uL (ref 0.00–0.07)
Basophils Absolute: 0.1 10*3/uL (ref 0.0–0.1)
Basophils Relative: 0 %
Eosinophils Absolute: 0.1 10*3/uL (ref 0.0–0.5)
Eosinophils Relative: 1 %
HCT: 36.8 % (ref 36.0–46.0)
Hemoglobin: 12.6 g/dL (ref 12.0–15.0)
Immature Granulocytes: 0 %
Lymphocytes Relative: 12 %
Lymphs Abs: 1.6 10*3/uL (ref 0.7–4.0)
MCH: 30.2 pg (ref 26.0–34.0)
MCHC: 34.2 g/dL (ref 30.0–36.0)
MCV: 88.2 fL (ref 80.0–100.0)
Monocytes Absolute: 0.9 10*3/uL (ref 0.1–1.0)
Monocytes Relative: 7 %
Neutro Abs: 10.2 10*3/uL — ABNORMAL HIGH (ref 1.7–7.7)
Neutrophils Relative %: 80 %
Platelet Count: 215 10*3/uL (ref 150–400)
RBC: 4.17 MIL/uL (ref 3.87–5.11)
RDW: 12.4 % (ref 11.5–15.5)
WBC Count: 12.8 10*3/uL — ABNORMAL HIGH (ref 4.0–10.5)
nRBC: 0 % (ref 0.0–0.2)

## 2022-04-15 LAB — TSH: TSH: 0.892 u[IU]/mL (ref 0.350–4.500)

## 2022-04-16 ENCOUNTER — Encounter: Payer: Self-pay | Admitting: Oncology

## 2022-04-16 ENCOUNTER — Inpatient Hospital Stay: Payer: Medicare Other | Attending: Hematology and Oncology

## 2022-04-16 VITALS — BP 152/82 | HR 87 | Temp 97.9°F | Resp 18 | Ht 64.0 in | Wt 187.8 lb

## 2022-04-16 DIAGNOSIS — Z5111 Encounter for antineoplastic chemotherapy: Secondary | ICD-10-CM | POA: Diagnosis not present

## 2022-04-16 DIAGNOSIS — Z79899 Other long term (current) drug therapy: Secondary | ICD-10-CM | POA: Diagnosis not present

## 2022-04-16 DIAGNOSIS — C169 Malignant neoplasm of stomach, unspecified: Secondary | ICD-10-CM | POA: Diagnosis not present

## 2022-04-16 DIAGNOSIS — R7989 Other specified abnormal findings of blood chemistry: Secondary | ICD-10-CM

## 2022-04-16 DIAGNOSIS — E860 Dehydration: Secondary | ICD-10-CM

## 2022-04-16 MED ORDER — HEPARIN SOD (PORK) LOCK FLUSH 100 UNIT/ML IV SOLN
500.0000 [IU] | Freq: Once | INTRAVENOUS | Status: AC | PRN
  Administered 2022-04-16: 500 [IU]

## 2022-04-16 MED ORDER — POTASSIUM CHLORIDE IN NACL 20-0.9 MEQ/L-% IV SOLN
Freq: Once | INTRAVENOUS | Status: AC
  Filled 2022-04-16: qty 1000

## 2022-04-16 MED ORDER — SODIUM CHLORIDE 0.9% FLUSH
10.0000 mL | Freq: Once | INTRAVENOUS | Status: AC | PRN
  Administered 2022-04-16: 10 mL

## 2022-04-16 NOTE — Addendum Note (Signed)
Addended by: Juanetta Beets on: 04/16/2022 09:16 AM   Modules accepted: Orders

## 2022-04-16 NOTE — [facility] Notes (Signed)
[name]  [address] [place],  [name]  [address] [phone]  Clinic Day: 04/19/22   Referring physician: Ernestene Kiel, MD  ASSESSMENT & PLAN:  Assessment: Gastric adenocarcinoma This is a poorly differentiated adenocarcinoma with signet ring features and ulceration. Stain for HER2 was negative at 0. I would consider it as metastatic to the omentum and so that would stage this as a T4 N0 M1, stage IVB.  She has been on treatment with chemotherapy with FOLFOX and nivolumab for 10 cycles with very good response, but then had increasing dermatologic toxicities and neuropathy. Her PET scan from 03/22/2022 shows a new area of hypermetabolic activity in the right lower lobe of peripheral consolidation, which is more likely atelectasis or resolving pneumonia measuring 3cm. The thickening of her stomach has decreased with decreased metabolic activity and there is shrinkage of the omental metastasis from 78mm to 7mm. The last time she received chemotherapy alone she had severe rash and fever and was admitted to the hospital, but she was also found to have pneumonia and a UTI at that time. We are not sure which medication caused her rashes.    Immune Mediated Nephritis Her creatinine is improved at 1.6 today and she will receive 1 liter of normal saline tomorrow and was encouraged to drink more fluids. Renal ultrasound was normal. We felt this was caused by her immunotherapy and placed her on Prednisone. I had ordered 10mg  tablets to take 20mg  twice daily but the patient misunderstood and has only been taking 10mg  twice daily. Since she already has a substantial improvement, we will stay on that dose and monitor.    Plan The patient developed a rise in her creatinine to 2.2 with a BUN of 46, which only improved to 2.0 with IV fluids. She had a renal ultrasound which was normal and so I feel this is likely immune mediated nephritis. She was therefore started  on Prednisone but misunderstood the directions and is only taking 10mg  twice daily.  Her WBC is at 16.5 and her hemoglobin at 13.0 as of 04/19/2022. Her creatinine is 1.6, down from 2.2, so she will not need an increase in her Prednisone. Her BUN is 39 and she has been increasing her fluid intake. She will receive IV fluids and an EGD tomorrow 04/20/2022. Patient states that she feels well and wondered about the color change and thickness of her toenails. I advised her to see a podiatrist for further help. [name] back on 04/29/2022 for revaluation and make a decision about the chemotherapy for March 18th. Patient will be going to the beach from April 1-7 so we will postpone the next cycle until after she returns. I reviewed all of this with her and her daughter and answered their questions. She understands and agrees with this plan.   I provided 15 minutes of face-to-face time during this this encounter and > 50% was spent counseling as documented under my assessment and plan.   Derwood Kaplan, MD [facility] [address] [place] [address] Dept: [phone] Dept Fax: [phone]     CHIEF COMPLAINT:  CC: Gastric cancer  Current Treatment: Chemotherapy/immunotherapy   HISTORY OF PRESENT ILLNESS:  Mackenzie Lewis is a 87 y.o. female with a history of gastric cancer who is referred in consultation with Dr. Sandria Senter for assessment and management.  She had noticed that she was having regurgitation when eating  and had lost over 30 pounds.  An ultrasound was done, revealing hepatic steatosis, and led to an MRI scan on June 5 which revealed gastric wall thickening with confluent nodularity of the omentum anterior to the stomach measuring 3.5 cm consistent with metastatic tumor.  She also had low-grade edema and wall thickening extending into the duodenum from the stomach.  She was referred to Dr. Sandria Senter and  he did an EGD on July 7.  This revealed a large ulceration measuring 1.2 cm along the greater curvature.  She also had diffusely edematous and erythematous wall with erosions of the antrum and stiff and friable mucosa with oozing of blood.  These findings extended to the gastric fundus as well.  Pathology revealed a poorly differentiated adenocarcinoma with signet ring features from the biopsies of the ulcer as well as the antrum and the fundus of the stomach.  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  She was placed on omeprazole.  Her test for H. pylori was negative.  She was referred to Dr. Kendell Bane for consideration of surgery but he felt this was not resectable because of the extensive involvement and I agree.  I would consider this extending to the duodenum and also metastatic to the omentum.  PET scan confirmed these findings and she wished to pursue systemic intravenous therapy.  She continues to eat and has adjusted her diet to softer foods and liquids and is drinking boost so she has maintained her weight.  She does have some Zofran ODT for nausea when needed.  She has had some anorexia, nausea, occasional vomiting, early satiety, dysphagia, and belching.  A CEA and CA 19-9 were normal. She has been started on FOLFOX chemotherapy along with immunotherapy.  I have reviewed her chart and materials related to her cancer extensively and collaborated history with the patient. Summary of oncologic history is as follows: Oncology History  Gastric cancer ([facility])  09/10/2021 Initial Diagnosis   Gastric cancer ([facility])   09/10/2021 Cancer Staging   Staging form: Stomach, AJCC 8th Edition - Clinical stage from 09/10/2021: Stage IVB (cT4b, cN0, cM1) - Signed by Derwood Kaplan, MD on 09/10/2021 Histopathologic type: Adenocarcinoma, NOS Stage prefix: Initial diagnosis Total positive nodes: 0 Histologic grade (G): G3 Histologic grading system: 3 grade  system Sites of metastasis: Peritoneal surface Diagnostic confirmation: Positive histology PLUS positive immunophenotyping and/or positive genetic studies Specimen type: Endoscopy with Biopsy Staged by: Managing physician Carcinoembryonic antigen (CEA) (ng/mL): 2.8 Carbohydrate antigen 19-9 (CA 19-9) (U/mL): 4.9 HER2 status: Unknown Microsatellite instability (MSI): Unknown Tumor location in stomach: Other Clinical staging modalities: Biopsy, Endoscopy Stage used in treatment planning: Yes National guidelines used in treatment planning: Yes Type of national guideline used in treatment planning: NCCN   09/28/2021 - 10/14/2021 Chemotherapy   Patient is on Treatment Plan : GASTROESOPHAGEAL FOLFOX + Nivolumab q14d     09/28/2021 -  Chemotherapy   Patient is on Treatment Plan : GASTROESOPHAGEAL FOLFOX + Nivolumab q14d     12/09/2021 Genetic Testing   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  Report date is 12/09/2021.   The Multi-Cancer + RNA Panel offered by [facility] includes sequencing and/or deletion/duplication analysis of the following 84 genes:  AIP*, ALK, APC*, ATM*, AXIN2*, BAP1*, BARD1*, BLM*, BMPR1A*, BRCA1*, BRCA2*, BRIP1*, CASR, CDC73*, CDH1*, CDK4, CDKN1B*, CDKN1C*, CDKN2A, CEBPA, CHEK2*, CTNNA1*, DICER1*, DIS3L2*, EGFR, EPCAM, FH*, FLCN*, GATA2*, GPC3, GREM1, HOXB13, HRAS, KIT, MAX*, MEN1*, MET, MITF,  MLH1*, MSH2*, MSH3*, MSH6*, MUTYH*, NBN*, NF1*, NF2*, NTHL1*, PALB2*, PDGFRA, PHOX2B, PMS2*, POLD1*, POLE*, POT1*, PRKAR1A*, PTCH1*, PTEN*, RAD50*, RAD51C*, RAD51D*, RB1*, RECQL4, RET, RUNX1*, SDHA*, SDHAF2*, SDHB*, SDHC*, SDHD*, SMAD4*, SMARCA4*, SMARCB1*, SMARCE1*, STK11*, SUFU*, TERC, TERT, TMEM127*, Tp53*, TSC1*, TSC2*, VHL*, WRN*, and WT1.  RNA analysis is performed for * genes.     INTERVAL HISTORY:  Moreen is here today for a follow-up for her gastric cancer. Patient developed a rise in her creatinine to 2.2 with a BUN of 46, which  only improved to 2.0 with IV fluids. She had a renal ultrasound which was normal and so I feel this is likely immune mediated nephritis. She was therefore started on Prednisone but misunderstood the directions and is only taking 10mg  twice daily.  Her WBC is at 16.5 and her hemoglobin at 13.0 as of 04/19/2022. Her creatinine is 1.6, down from 2.2, so she will not need an increase in her Prednisone. Her BUN is at 39 and she has been increasing her fluid intake. She will receive IV fluids and an EGD tomorrow 04/20/2022. Patient states that she feels well and wondered about the color change and thickness of her toenails. I advised her to soak her feet in warm water and epsom salt before cutting it down. I also informed her if it is a fungus there are medications that she could take to help alleviate it and recommended she see a podiatrist to help find further help. We discussed the possibility of receiving 5FU/leucovorin soon. [name] back on 04/29/2022 for revaluation and make a decision about the chemotherapy for March 18th. Patient will be going to the beach from April 1-7 so we will postpone the next cycle until after she returns.  She denies signs of infection such as sore throat, sinus drainage, cough, or urinary symptoms.  She denies fevers or recurrent chills. She denies pain. She denies nausea, vomiting, chest pain, dyspnea or cough. Her appetite is good and her weight has increased 1 pounds over last week . She is accompanied at today's visit by her daughter.   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  If that happens, she is to call us so that we can schedule her for an ultrasound to look for synovitis.     Essential hypertension 09/09/2016   Gastric cancer ([facility]) 09/10/2021   Hyperlipidemia 09/01/2016   Hypertension    Hypothyroidism 09/01/2016   Osteoarthritis of both feet 01/20/2016   Osteoarthritis, hand 01/20/2016   Thyroid disease   Degenerative disc disease History of endometriosis  Past Surgical History:  Procedure Laterality Date   APPENDECTOMY     LAPAROSCOPIC APPENDECTOMY N/A 04/10/2016   Procedure: APPENDECTOMY LAPAROSCOPIC;  Surgeon: Coralie Keens, MD;  Location: [facility];  Service: General;  Laterality: N/A;  Bilateral tubal ligation Total hysterectomy and bilateral salpingo-oophorectomy in 1980  Family History  Problem Relation Age of Onset   Hypertension Mother    Prostate cancer Father  metastatic; d. 4   Brain cancer Sister 42   Breast cancer Sister 43   AAA (abdominal aortic aneurysm) Brother    Leukemia Cousin        x2 maternal female cousins; d. before 2   Breast cancer Daughter 69       DCIS  Her sister has had breast cancer as well as a tumor of her head Her daughter has had breast cancer last year  Social History:  reports that she has never smoked. She has never used smokeless tobacco. She reports that she does not currently use alcohol. She reports that she does not use drugs.The patient is accompanied by her son, daughter-in-law and daughter today.  She is single but lives with a significant other.  She has the 2 children.  She worked in [employer] (medical) and denies any chemical or toxin exposures.  She grew up in [place] and [place].  She is active and healthy, especially for her age.  Allergies:  Allergies  Allergen Reactions   Doxycycline Rash   Sulfa Antibiotics Rash and Other (See Comments)    Other  reaction(s): Other (See Comments)  "Made me feel weird"    Current Medications: Current Outpatient Medications  Medication Sig Dispense Refill   aspirin 81 MG EC tablet Take 81 mg by mouth daily. Swallow whole.     Calcium Carbonate (CALCIUM 600 PO) Take 1 tablet by mouth daily.     Cholecalciferol (VITAMIN D3) 5000 units CAPS Take 1 capsule by mouth daily.     EXFORGE HCT 5-160-12.5 MG TABS Take 1 tablet by mouth daily.     famotidine (PEPCID) 40 MG tablet Take 40 mg by mouth at bedtime.     KRILL OIL PO Take 1 capsule by mouth daily. Unknown strenght     Lactobacillus TABS Take 1 tablet by mouth 2 (two) times daily.     levothyroxine (SYNTHROID, LEVOTHROID) 75 MCG tablet Take 75 mcg by mouth daily before breakfast.      LORazepam (ATIVAN) 0.5 MG tablet Take 1 tablet (0.5 mg total) by mouth at bedtime. 30 tablet 0   magic mouthwash SOLN SWISH AND SWALLOW 5ML BY MOUTH EVERY THREE TO FOUR HOURS     NON FORMULARY MMW: 3 parts Maalox 2 parts Benadryl 1 part viscious lidicaine  Disp. 6oz  Instructions: 22ml swish and swallow every 3-4 hours     omeprazole (PRILOSEC) 40 MG capsule Take 1 capsule (40 mg total) by mouth 2 (two) times daily. 60 capsule 5   ondansetron (ZOFRAN) 4 MG tablet Take 4 mg by mouth every 4 (four) hours as needed.     ondansetron (ZOFRAN-ODT) 4 MG disintegrating tablet Take 1 tablet (4 mg total) by mouth every 8 (eight) hours as needed for nausea or vomiting. 90 tablet 0   polyethylene glycol powder (GLYCOLAX/MIRALAX) 17 GM/SCOOP powder SMARTSIG:1 scoopful By Mouth Daily     potassium chloride SA (KLOR-CON M) 20 MEQ tablet Take 1 tablet (20 mEq total) by mouth daily. 30 tablet 5   pravastatin (PRAVACHOL) 20 MG tablet Take 1 tablet (20 mg total) by mouth every evening. 90 tablet 3   predniSONE (DELTASONE) 10 MG tablet Take 2 tablets (20 mg total) by mouth 2 (two) times daily with a meal. (Patient taking differently: Take 10 mg by mouth 2 (two) times daily with a meal.  One 10mg  tablet twice a day) 100 tablet 1   Probiotic Product (PROBIOTIC DAILY PO) Take 1 tablet by mouth daily.  prochlorperazine (COMPAZINE) 10 MG tablet Take 1 tablet (10 mg total) by mouth every 6 (six) hours as needed for nausea or vomiting. 90 tablet 3   triamcinolone 0.1% oint-Eucerin equivalent cream 1:1 mixture Apply topically 2 (two) times daily. 160 g 0   triamcinolone cream (KENALOG) 0.1 % Apply to affected skin twice daily (Patient taking differently: daily as needed. Apply to affected skin twice daily) 160 g 0   No current facility-administered medications for this visit.    REVIEW OF SYSTEMS:  Review of Systems  Constitutional: Negative.  Negative for appetite change, chills, diaphoresis, fatigue, fever and unexpected weight change.  HENT:  Negative.  Negative for hearing loss, lump/mass, mouth sores, nosebleeds, sore throat, tinnitus, trouble swallowing and voice change.   Eyes: Negative.  Negative for eye problems and icterus.  Respiratory: Negative.  Negative for chest tightness, cough, hemoptysis, shortness of breath and wheezing.   Cardiovascular: Negative.  Negative for chest pain, leg swelling and palpitations.  Gastrointestinal: Negative.  Negative for abdominal distention, abdominal pain, blood in stool, constipation, diarrhea, nausea, rectal pain and vomiting.  Endocrine: Negative.  Negative for hot flashes.  Genitourinary: Negative.  Negative for bladder incontinence, difficulty urinating, dyspareunia, dysuria, frequency, hematuria, menstrual problem, nocturia, pelvic pain, vaginal bleeding and vaginal discharge.   Musculoskeletal: Negative.  Negative for arthralgias, back pain, flank pain, gait problem, myalgias, neck pain and neck stiffness.  Skin: Negative.  Negative for itching, rash and wound.       Persistent scars of the lower extremities at previous rash site.  Neurological:  Positive for numbness (in her fingers and toes). Negative for dizziness, extremity  weakness, gait problem, headaches, light-headedness, seizures and speech difficulty.  Hematological: Negative.  Negative for adenopathy. Does not bruise/bleed easily.  Psychiatric/Behavioral: Negative.  Negative for confusion, decreased concentration, depression, sleep disturbance and suicidal ideas. The patient is not nervous/anxious.    VITALS:  Blood pressure (!) 154/87, pulse 86, temperature 98.7 F (37.1 C), temperature source Oral, resp. rate 18, height 5\' 4"  (1.626 m), weight 189 lb (85.7 kg), SpO2 98 %.  Wt Readings from Last 3 Encounters:  05/13/22 191 lb 1.9 oz (86.7 kg)  05/07/22 189 lb 4 oz (85.8 kg)  05/03/22 188 lb (85.3 kg)    Body mass index is 32.44 kg/m.  Performance status (ECOG): 1 - Symptomatic but completely ambulatory  PHYSICAL EXAM:  Physical Exam Vitals and nursing note reviewed. Exam conducted with a chaperone present.  Constitutional:      General: She is not in acute distress.    Appearance: Normal appearance. She is normal weight. She is not ill-appearing, toxic-appearing or diaphoretic.  HENT:     Head: Normocephalic and atraumatic.     Right Ear: Tympanic membrane, ear canal and external ear normal. There is no impacted cerumen.     Left Ear: Tympanic membrane, ear canal and external ear normal. There is no impacted cerumen.     Nose: Nose normal. No congestion or rhinorrhea.     Mouth/Throat:     Mouth: Mucous membranes are moist.     Pharynx: Oropharynx is clear. No oropharyngeal exudate or posterior oropharyngeal erythema.  Eyes:     General: No scleral icterus.       Right eye: No discharge.        Left eye: No discharge.     Extraocular Movements: Extraocular movements intact.     Conjunctiva/sclera: Conjunctivae normal.     Pupils: Pupils are equal, round, and reactive to light.  Neck:     Vascular: No carotid bruit.  Cardiovascular:     Rate and Rhythm: Normal rate and regular rhythm.     Pulses: Normal pulses.     Heart sounds: Normal  heart sounds. No murmur heard.    No friction rub. No gallop.  Pulmonary:     Effort: Pulmonary effort is normal. No respiratory distress.     Breath sounds: Normal breath sounds. No stridor. No wheezing, rhonchi or rales.  Chest:     Chest wall: No tenderness.  Abdominal:     General: Bowel sounds are normal. There is no distension.     Palpations: Abdomen is soft. There is no hepatomegaly, splenomegaly or mass.     Tenderness: There is no abdominal tenderness. There is no right CVA tenderness, left CVA tenderness, guarding or rebound.     Hernia: No hernia is present.     Comments: She still has mild firmness across the upper mid abdomen.   Musculoskeletal:        General: No swelling, tenderness, deformity or signs of injury. Normal range of motion.     Cervical back: Normal range of motion and neck supple. No rigidity or tenderness.     Right lower leg: No edema.     Left lower leg: No edema.  Lymphadenopathy:     Cervical: No cervical adenopathy.     Right cervical: No superficial, deep or posterior cervical adenopathy.    Left cervical: No superficial, deep or posterior cervical adenopathy.     Upper Body:     Right upper body: No supraclavicular, axillary or pectoral adenopathy.     Left upper body: No supraclavicular, axillary or pectoral adenopathy.  Skin:    General: Skin is warm and dry.     Coloration: Skin is not jaundiced or pale.     Findings: No bruising, erythema, lesion or rash.     Comments: She has persistent scars on the lower extremities.   Neurological:     General: No focal deficit present.     Mental Status: She is alert and oriented to person, place, and time. Mental status is at baseline.     Cranial Nerves: No cranial nerve deficit.     Sensory: No sensory deficit.     Motor: No weakness.     Coordination: Coordination normal.     Gait: Gait normal.     Deep Tendon Reflexes: Reflexes normal.  Psychiatric:        Mood and Affect: Mood normal.         Behavior: Behavior normal.        Thought Content: Thought content normal.        Judgment: Judgment normal.    LABS:      Latest Ref Rng & Units 04/29/2022   12:00 AM 04/19/2022   12:00 AM 04/15/2022    8:56 AM  CBC  WBC  19.1     16.5     12.8   Hemoglobin 12.0 - 16.0 12.5     13.0     12.6   Hematocrit 36 - 46 37     38     36.8   Platelets 150 - 400 K/uL 209     260     215      This result is from an external source.      Latest Ref Rng & Units 04/29/2022    2:33 PM 04/29/2022   12:00 AM 04/19/2022  12:00 AM  CMP  Glucose 70 - 99 mg/dL 155     BUN 8 - 23 mg/dL 41  42     39      Creatinine 0.44 - 1.00 mg/dL 1.67  1.5     1.6      Sodium 135 - 145 mmol/L 137  136     137      Potassium 3.5 - 5.1 mmol/L 3.7  3.7     3.9      Chloride 98 - 111 mmol/L 103  105     105      CO2 22 - 32 mmol/L 24  25     23       Calcium 8.9 - 10.3 mg/dL 9.2  9.2     9.5      Total Protein 6.5 - 8.1 g/dL 6.4     Total Bilirubin 0.3 - 1.2 mg/dL 0.7     Alkaline Phos 38 - 126 U/L 60  79     80      AST 15 - 41 U/L 23  67     23      ALT 0 - 44 U/L 24  27     21          This result is from an external source.   Lab Results  Component Value Date   CEA1 2.9 09/10/2021   /  CEA  Date Value Ref Range Status  09/10/2021 2.9 0.0 - 4.7 ng/mL Final    Comment:    (NOTE)                             Nonsmokers          <3.9                             Smokers             <5.6 [employer] Electrochemiluminescence Immunoassay (ECLIA) Values obtained with different assay methods or kits cannot be used interchangeably.  Results cannot be interpreted as absolute evidence of the presence or absence of malignant disease. Performed At: [facility] [place], [place] [id number] Rush Farmer MD [id number]    Component Ref Range & Units 03/22/2022 6 mo ago  Glucose-Capillary 70 - 99 mg/dL 96 124 High  CM   Component Ref Range & Units 1 d ago (04/15/22) 9 d  ago (04/07/22) 3 wk ago (03/24/22) 1 mo ago (02/16/22) 2 mo ago (01/28/22) 3 mo ago (01/14/22) 3 mo ago (12/31/21)  TSH 0.350 - 4.500 uIU/mL 0.892 1.895 CM 1.835 CM 2.087 CM 1.773 CM 2.604 CM 1.987 CM      Component Ref Range & Units 2 d ago (04/07/22) 2 wk ago (03/24/22) 1 mo ago (02/16/22) 2 mo ago (01/28/22) 2 mo ago (01/14/22) 3 mo ago (12/31/21) 4 mo ago (12/03/21)  T4, Total 4.5 - 12.0 ug/dL 10.1 10.5 CM 12.5 High  CM 14.6 High  CM 13.2 High  CM 12.3 High  CM 13.4 High  CM     No results found for: "PSA1" No results found for: "[id number]" No results found for: "CAN125"  No results found for: "TOTALPROTELP", "ALBUMINELP", "A1GS", "A2GS", "BETS", "BETA2SER", "GAMS", "MSPIKE", "SPEI" No results found for: "TIBC", "FERRITIN", "IRONPCTSAT" No results found for: "LDH"  STUDIES:  No results found.  EXAM: 03/22/2022 NUCLEAR MEDICINE PET  SKULL BASE TO THIGH IMPRESSION: 1. Resolution of metabolic activity associated with the stomach. 2. Decrease in size of omental nodularity. No associated metabolic activity. 3. No evidence of new or progressive gastric carcinoma. 4. New peripheral consolidation in the RIGHT lower lobe with moderate metabolic activity. Favor focus of atelectasis versus less likely pneumonia. 5.  Aortic Atherosclerosis (ICD10-I70.0).  EXAM:12/16/21 CT CHEST, ABDOMEN, AND PELVIS WITH CONTRAST IMPRESSION: Response to therapy of gastric primary and adjacent omental nodularity. No new or progressive disease. Right larger than left lower lobe pulmonary nodules, felt to be similar to the 04/10/2016 remote CT and therefore benign. Progressive interstitial lung disease, right greater than left. This could be postinfectious/inflammatory or less likely represent nonspecific interstitial pneumonitis. Improvement in common duct dilation, likely incidental given chronicity.   EXAM:09/18/2021 NUCLEAR MEDICINE PET SKULL BASE TO THIGH NUCLEAR MEDICINE PET SKULL BASE TO  THIGH IMPRESSION: 1. Evaluation of the gastric wall is limited by minimal distension, within this context, here is diffuse hypermetabolic activity within the stomach with similar diffuse nonspecific gastric wall thickening and no significant change in the appearance of the possible anterior gastric body ulceration. 2. Mildly metabolic omental nodularity anterior and inferior to the stomach, likely reflects omental disease involvement. 3. No convincing evidence of hypermetabolic metastatic disease in the neck, chest, or pelvis. 4. Similar mild intrahepatic and moderate extrahepatic biliary ductal dilation with the common duct measuring 13 mm no suspicious hypermetabolic lesion identified within the duct and no discrete lesion identified on prior MRI dated July 20, 2021. 5.  Aortic Atherosclerosis (ICD10-I70.0).   I,Jasmine M Lassiter,acting as a scribe for Derwood Kaplan, MD.,have documented all relevant documentation on the behalf of Derwood Kaplan, MD,as directed by  Derwood Kaplan, MD while in the presence of Derwood Kaplan, MD.

## 2022-04-16 NOTE — Patient Instructions (Signed)
Dehydration, Adult Dehydration is a condition in which there is not enough water or other fluids in the body. This happens when a person loses more fluids than they take in. Important organs cannot work right without the right amount of fluids. Any loss of fluids from the body can cause dehydration. Dehydration can be mild, worse, or very bad. It should be treated right away to keep it from getting very bad. What are the causes? Conditions that cause loss of water in the body. They include: Watery poop (diarrhea). Vomiting. Sweating a lot. Fever. Infection. Peeing (urinating) a lot. Not drinking enough fluids. Certain medicines, such as medicines that take extra fluid out of the body (diuretics). Lack of safe drinking water. Not being able to get enough water and food. What increases the risk? Having a long-term (chronic) illness that has not been treated the right way, such as: Diabetes. Heart disease. Kidney disease. Being 31 years of age or older. Having a disability. Living in a place that is high above the ground or sea (high in altitude). The thinner, drier air causes more fluid loss. Doing exercises that put stress on your body for a long time. Being active when in hot places. What are the signs or symptoms? Symptoms of dehydration depend on how bad it is. Mild or worse dehydration Thirst. Dry lips or dry mouth. Feeling dizzy or light-headed. Muscle cramps. Passing little pee or dark pee. Pee may be the color of tea. Headache. Very bad dehydration Changes in skin. Skin may: Be cold to the touch (clammy). Be blotchy or pale. Not go back to normal right after you pinch it and let it go. Little or no tears, pee, or sweat. Fast breathing. Low blood pressure. Weak pulse. Pulse that is more than 100 beats a minute when you are sitting still. Other changes, such as: Feeling very thirsty. Eyes that look hollow (sunken). Cold hands and feet. Being confused. Being very  tired (lethargic) or having trouble waking from sleep. Losing weight. Loss of consciousness. How is this treated? Treatment for this condition depends on how bad your dehydration is. Treatment should start right away. Do not wait until your condition gets very bad. Very bad dehydration is an emergency. You will need to go to a hospital. Mild or worse dehydration can be treated at home. You may be asked to: Drink more fluids. Drink an oral rehydration solution (ORS). This drink gives you the right amount of fluids, salts, and minerals (electrolytes). Very bad dehydration can be treated: With fluids through an IV tube. By correcting low levels of electrolytes in the body. By treating the problem that caused your dehydration. Follow these instructions at home: Oral rehydration solution If told by your doctor, drink an ORS: Make an ORS. Use instructions on the package. Start by drinking small amounts, about  cup (120 mL) every 5-10 minutes. Slowly drink more until you have had the amount that your doctor said to have.  Eating and drinking  Drink enough clear fluid to keep your pee pale yellow. If you were told to drink an ORS, finish the ORS first. Then, start slowly drinking other clear fluids. Drink fluids such as: Water. Do not drink only water. Doing that can make the salt (sodium) level in your body get too low. Water from ice chips you suck on. Fruit juice that you have added water to (diluted). Low-calorie sports drinks. Eat foods that have the right amounts of salts and minerals, such as bananas, oranges, potatoes,  tomatoes, or spinach. Do not drink alcohol. Avoid drinks that have caffeine or sugar. These include:: High-calorie sports drinks. Fruit juice that you did not add water to. Soda. Coffee or energy drinks. Avoid foods that are greasy or have a lot of fat or sugar. General instructions Take over-the-counter and prescription medicines only as told by your doctor. Do  not take sodium tablets. Doing that can make the salt level in your body get too high. Return to your normal activities as told by your doctor. Ask your doctor what activities are safe for you. Keep all follow-up visits. Your doctor may check and change your treatment. Contact a doctor if: You have pain in your belly (abdomen) and the pain: Gets worse. Stays in one place. You have a rash. You have a stiff neck. You get angry or annoyed more easily than normal. You are more tired or have a harder time waking than normal. You feel weak or dizzy. You feel very thirsty. Get help right away if: You have any symptoms of very bad dehydration. You vomit every time you eat or drink. Your vomiting gets worse, does not go away, or you vomit blood or green stuff. You are getting treatment, but symptoms are getting worse. You have a fever. You have a very bad headache. You have: Diarrhea that gets worse or does not go away. Blood in your poop (stool). This may cause poop to look black and tarry. No pee in 6-8 hours. Only a small amount of pee in 6-8 hours, and the pee is very dark. You have trouble breathing. These symptoms may be an emergency. Get help right away. Call 911. Do not wait to see if the symptoms will go away. Do not drive yourself to the hospital. This information is not intended to replace advice given to you by your health care provider. Make sure you discuss any questions you have with your health care provider. Document Revised: 08/31/2021 Document Reviewed: 08/31/2021 Elsevier Patient Education  [place]. Potassium Chloride Injection What is this medication? POTASSIUM CHLORIDE (poe TASS i um KLOOR ide) prevents and treats low levels of potassium in your body. Potassium plays an important role in maintaining the health of your kidneys, heart, muscles, and nervous system. This medicine may be used for other purposes; ask your health care provider or pharmacist if you  have questions. COMMON BRAND NAME(S): PROAMP What should I tell my care team before I take this medication? They need to know if you have any of these conditions: Addison disease Dehydration Diabetes (high blood sugar) Heart disease High levels of potassium in the blood Irregular heartbeat or rhythm Kidney disease Large areas of burned skin An unusual or allergic reaction to potassium, other medications, foods, dyes, or preservatives Pregnant or trying to get pregnant Breast-feeding How should I use this medication? This medication is injected into a vein. It is given in a hospital or clinic setting. Talk to your care team about the use of this medication in children. Special care may be needed. Overdosage: If you think you have taken too much of this medicine contact a poison control center or emergency room at once. NOTE: This medicine is only for you. Do not share this medicine with others. What if I miss a dose? This does not apply. This medication is not for regular use. What may interact with this medication? Do not take this medication with any of the following: Certain diuretics, such as spironolactone, triamterene Eplerenone Sodium polystyrene sulfonate This  medication may also interact with the following: Certain medications for blood pressure or heart disease, such as lisinopril, losartan, quinapril, valsartan Medications that lower your chance of fighting infection, such as cyclosporine, tacrolimus NSAIDs, medications for pain and inflammation, such as ibuprofen or naproxen Other potassium supplements Salt substitutes This list may not describe all possible interactions. Give your health care provider a list of all the medicines, herbs, non-prescription drugs, or dietary supplements you use. Also tell them if you smoke, drink alcohol, or use illegal drugs. Some items may interact with your medicine. What should I watch for while using this medication? Visit your care  team for regular checks on your progress. Tell your care team if your symptoms do not start to get better or if they get worse. You may need blood work while you are taking this medication. Avoid salt substitutes unless you are told otherwise by your care team. What side effects may I notice from receiving this medication? Side effects that you should report to your care team as soon as possible: Allergic reactions--skin rash, itching, hives, swelling of the face, lips, tongue, or throat High potassium level--muscle weakness, fast or irregular heartbeat Side effects that usually do not require medical attention (report to your care team if they continue or are bothersome): Diarrhea Nausea Stomach pain Vomiting This list may not describe all possible side effects. Call your doctor for medical advice about side effects. You may report side effects to FDA at 1-800-FDA-1088. Where should I keep my medication? This medication is given in a hospital or clinic. It will not be stored at home. NOTE: This sheet is a summary. It may not cover all possible information. If you have questions about this medicine, talk to your doctor, pharmacist, or health care provider.  2023 Elsevier/Gold Standard (2007-03-25 00:00:00)

## 2022-04-17 LAB — T4: T4, Total: 10.9 ug/dL (ref 4.5–12.0)

## 2022-04-19 ENCOUNTER — Encounter: Payer: Self-pay | Admitting: Oncology

## 2022-04-19 ENCOUNTER — Inpatient Hospital Stay (INDEPENDENT_AMBULATORY_CARE_PROVIDER_SITE_OTHER): Payer: Medicare Other | Admitting: Oncology

## 2022-04-19 ENCOUNTER — Ambulatory Visit: Payer: TRICARE For Life (TFL)

## 2022-04-19 ENCOUNTER — Inpatient Hospital Stay: Payer: Medicare Other | Attending: Hematology and Oncology

## 2022-04-19 VITALS — BP 154/87 | HR 86 | Temp 98.7°F | Resp 18 | Ht 64.0 in | Wt 189.0 lb

## 2022-04-19 DIAGNOSIS — C168 Malignant neoplasm of overlapping sites of stomach: Secondary | ICD-10-CM

## 2022-04-19 DIAGNOSIS — D649 Anemia, unspecified: Secondary | ICD-10-CM | POA: Diagnosis not present

## 2022-04-19 LAB — BASIC METABOLIC PANEL
BUN: 39 — AB (ref 4–21)
CO2: 23 — AB (ref 13–22)
Chloride: 105 (ref 99–108)
Creatinine: 1.6 — AB (ref 0.5–1.1)
Glucose: 144
Potassium: 3.9 mEq/L (ref 3.5–5.1)
Sodium: 137 (ref 137–147)

## 2022-04-19 LAB — HEPATIC FUNCTION PANEL
ALT: 21 U/L (ref 7–35)
AST: 23 (ref 13–35)
Alkaline Phosphatase: 80 (ref 25–125)
Bilirubin, Total: 0.3

## 2022-04-19 LAB — COMPREHENSIVE METABOLIC PANEL
Albumin: 4.3 (ref 3.5–5.0)
Calcium: 9.5 (ref 8.7–10.7)

## 2022-04-19 LAB — CBC AND DIFFERENTIAL
HCT: 38 (ref 36–46)
Hemoglobin: 13 (ref 12.0–16.0)
Neutrophils Absolute: 12.54
Platelets: 260 10*3/uL (ref 150–400)
WBC: 16.5

## 2022-04-19 LAB — CBC: RBC: 4.36 (ref 3.87–5.11)

## 2022-04-19 NOTE — Addendum Note (Signed)
Addended by: Juanetta Beets on: 04/19/2022 05:03 PM   Modules accepted: Orders

## 2022-04-20 ENCOUNTER — Inpatient Hospital Stay: Payer: Medicare Other

## 2022-04-20 VITALS — BP 161/76 | HR 80 | Temp 98.3°F | Resp 18 | Ht 64.0 in | Wt 187.0 lb

## 2022-04-20 DIAGNOSIS — I10 Essential (primary) hypertension: Secondary | ICD-10-CM | POA: Diagnosis not present

## 2022-04-20 DIAGNOSIS — Z79899 Other long term (current) drug therapy: Secondary | ICD-10-CM | POA: Diagnosis not present

## 2022-04-20 DIAGNOSIS — E309 Disorder of puberty, unspecified: Secondary | ICD-10-CM | POA: Diagnosis not present

## 2022-04-20 DIAGNOSIS — K2970 Gastritis, unspecified, without bleeding: Secondary | ICD-10-CM | POA: Diagnosis not present

## 2022-04-20 DIAGNOSIS — E860 Dehydration: Secondary | ICD-10-CM

## 2022-04-20 DIAGNOSIS — C169 Malignant neoplasm of stomach, unspecified: Secondary | ICD-10-CM | POA: Diagnosis not present

## 2022-04-20 DIAGNOSIS — K259 Gastric ulcer, unspecified as acute or chronic, without hemorrhage or perforation: Secondary | ICD-10-CM | POA: Diagnosis not present

## 2022-04-20 DIAGNOSIS — C162 Malignant neoplasm of body of stomach: Secondary | ICD-10-CM | POA: Diagnosis not present

## 2022-04-20 DIAGNOSIS — Z5111 Encounter for antineoplastic chemotherapy: Secondary | ICD-10-CM | POA: Diagnosis not present

## 2022-04-20 DIAGNOSIS — Z85028 Personal history of other malignant neoplasm of stomach: Secondary | ICD-10-CM | POA: Diagnosis not present

## 2022-04-20 DIAGNOSIS — K319 Disease of stomach and duodenum, unspecified: Secondary | ICD-10-CM | POA: Diagnosis not present

## 2022-04-20 DIAGNOSIS — G4733 Obstructive sleep apnea (adult) (pediatric): Secondary | ICD-10-CM | POA: Diagnosis not present

## 2022-04-20 DIAGNOSIS — R7989 Other specified abnormal findings of blood chemistry: Secondary | ICD-10-CM

## 2022-04-20 MED ORDER — SODIUM CHLORIDE 0.9% FLUSH
10.0000 mL | Freq: Once | INTRAVENOUS | Status: AC | PRN
  Administered 2022-04-20: 10 mL

## 2022-04-20 MED ORDER — HEPARIN SOD (PORK) LOCK FLUSH 100 UNIT/ML IV SOLN
500.0000 [IU] | Freq: Once | INTRAVENOUS | Status: AC | PRN
  Administered 2022-04-20: 500 [IU]

## 2022-04-20 MED ORDER — SODIUM CHLORIDE 0.9 % IV SOLN: Freq: Once | INTRAVENOUS | Status: AC

## 2022-04-20 NOTE — Patient Instructions (Signed)
Dehydration, Adult Dehydration is a condition in which there is not enough water or other fluids in the body. This happens when a person loses more fluids than they take in. Important organs cannot work right without the right amount of fluids. Any loss of fluids from the body can cause dehydration. Dehydration can be mild, worse, or very bad. It should be treated right away to keep it from getting very bad. What are the causes? Conditions that cause loss of water in the body. They include: Watery poop (diarrhea). Vomiting. Sweating a lot. Fever. Infection. Peeing (urinating) a lot. Not drinking enough fluids. Certain medicines, such as medicines that take extra fluid out of the body (diuretics). Lack of safe drinking water. Not being able to get enough water and food. What increases the risk? Having a long-term (chronic) illness that has not been treated the right way, such as: Diabetes. Heart disease. Kidney disease. Being 50 years of age or older. Having a disability. Living in a place that is high above the ground or sea (high in altitude). The thinner, drier air causes more fluid loss. Doing exercises that put stress on your body for a long time. Being active when in hot places. What are the signs or symptoms? Symptoms of dehydration depend on how bad it is. Mild or worse dehydration Thirst. Dry lips or dry mouth. Feeling dizzy or light-headed. Muscle cramps. Passing little pee or dark pee. Pee may be the color of tea. Headache. Very bad dehydration Changes in skin. Skin may: Be cold to the touch (clammy). Be blotchy or pale. Not go back to normal right after you pinch it and let it go. Little or no tears, pee, or sweat. Fast breathing. Low blood pressure. Weak pulse. Pulse that is more than 100 beats a minute when you are sitting still. Other changes, such as: Feeling very thirsty. Eyes that look hollow (sunken). Cold hands and feet. Being confused. Being very  tired (lethargic) or having trouble waking from sleep. Losing weight. Loss of consciousness. How is this treated? Treatment for this condition depends on how bad your dehydration is. Treatment should start right away. Do not wait until your condition gets very bad. Very bad dehydration is an emergency. You will need to go to a hospital. Mild or worse dehydration can be treated at home. You may be asked to: Drink more fluids. Drink an oral rehydration solution (ORS). This drink gives you the right amount of fluids, salts, and minerals (electrolytes). Very bad dehydration can be treated: With fluids through an IV tube. By correcting low levels of electrolytes in the body. By treating the problem that caused your dehydration. Follow these instructions at home: Oral rehydration solution If told by your doctor, drink an ORS: Make an ORS. Use instructions on the package. Start by drinking small amounts, about  cup (120 mL) every 5-10 minutes. Slowly drink more until you have had the amount that your doctor said to have.  Eating and drinking  Drink enough clear fluid to keep your pee pale yellow. If you were told to drink an ORS, finish the ORS first. Then, start slowly drinking other clear fluids. Drink fluids such as: Water. Do not drink only water. Doing that can make the salt (sodium) level in your body get too low. Water from ice chips you suck on. Fruit juice that you have added water to (diluted). Low-calorie sports drinks. Eat foods that have the right amounts of salts and minerals, such as bananas, oranges, potatoes,  tomatoes, or spinach. Do not drink alcohol. Avoid drinks that have caffeine or sugar. These include:: High-calorie sports drinks. Fruit juice that you did not add water to. Soda. Coffee or energy drinks. Avoid foods that are greasy or have a lot of fat or sugar. General instructions Take over-the-counter and prescription medicines only as told by your doctor. Do  not take sodium tablets. Doing that can make the salt level in your body get too high. Return to your normal activities as told by your doctor. Ask your doctor what activities are safe for you. Keep all follow-up visits. Your doctor may check and change your treatment. Contact a doctor if: You have pain in your belly (abdomen) and the pain: Gets worse. Stays in one place. You have a rash. You have a stiff neck. You get angry or annoyed more easily than normal. You are more tired or have a harder time waking than normal. You feel weak or dizzy. You feel very thirsty. Get help right away if: You have any symptoms of very bad dehydration. You vomit every time you eat or drink. Your vomiting gets worse, does not go away, or you vomit blood or green stuff. You are getting treatment, but symptoms are getting worse. You have a fever. You have a very bad headache. You have: Diarrhea that gets worse or does not go away. Blood in your poop (stool). This may cause poop to look black and tarry. No pee in 6-8 hours. Only a small amount of pee in 6-8 hours, and the pee is very dark. You have trouble breathing. These symptoms may be an emergency. Get help right away. Call 911. Do not wait to see if the symptoms will go away. Do not drive yourself to the hospital. This information is not intended to replace advice given to you by your health care provider. Make sure you discuss any questions you have with your health care provider. Document Revised: 08/31/2021 Document Reviewed: 08/31/2021 Elsevier Patient Education  [place].

## 2022-04-22 ENCOUNTER — Encounter: Payer: Self-pay | Admitting: Oncology

## 2022-04-25 ENCOUNTER — Encounter: Payer: Self-pay | Admitting: Oncology

## 2022-04-28 ENCOUNTER — Encounter: Payer: Self-pay | Admitting: Oncology

## 2022-04-29 ENCOUNTER — Encounter: Payer: Self-pay | Admitting: Oncology

## 2022-04-29 ENCOUNTER — Inpatient Hospital Stay (INDEPENDENT_AMBULATORY_CARE_PROVIDER_SITE_OTHER): Payer: Medicare Other | Admitting: Oncology

## 2022-04-29 ENCOUNTER — Other Ambulatory Visit: Payer: Self-pay | Admitting: Pharmacist

## 2022-04-29 ENCOUNTER — Other Ambulatory Visit: Payer: Self-pay | Admitting: Oncology

## 2022-04-29 ENCOUNTER — Inpatient Hospital Stay: Payer: Medicare Other

## 2022-04-29 VITALS — BP 145/69 | HR 88 | Temp 98.7°F | Resp 18 | Ht 64.0 in | Wt 188.6 lb

## 2022-04-29 DIAGNOSIS — C169 Malignant neoplasm of stomach, unspecified: Secondary | ICD-10-CM | POA: Diagnosis not present

## 2022-04-29 DIAGNOSIS — C168 Malignant neoplasm of overlapping sites of stomach: Secondary | ICD-10-CM

## 2022-04-29 DIAGNOSIS — N142 Nephropathy induced by unspecified drug, medicament or biological substance: Secondary | ICD-10-CM | POA: Diagnosis not present

## 2022-04-29 DIAGNOSIS — Z5111 Encounter for antineoplastic chemotherapy: Secondary | ICD-10-CM | POA: Diagnosis not present

## 2022-04-29 DIAGNOSIS — Z79899 Other long term (current) drug therapy: Secondary | ICD-10-CM | POA: Diagnosis not present

## 2022-04-29 DIAGNOSIS — D649 Anemia, unspecified: Secondary | ICD-10-CM | POA: Diagnosis not present

## 2022-04-29 LAB — CMP (CANCER CENTER ONLY)
ALT: 24 U/L (ref 0–44)
AST: 23 U/L (ref 15–41)
Albumin: 4 g/dL (ref 3.5–5.0)
Alkaline Phosphatase: 60 U/L (ref 38–126)
Anion gap: 10 (ref 5–15)
BUN: 41 mg/dL — ABNORMAL HIGH (ref 8–23)
CO2: 24 mmol/L (ref 22–32)
Calcium: 9.2 mg/dL (ref 8.9–10.3)
Chloride: 103 mmol/L (ref 98–111)
Creatinine: 1.67 mg/dL — ABNORMAL HIGH (ref 0.44–1.00)
GFR, Estimated: 30 mL/min — ABNORMAL LOW (ref >60)
Glucose, Bld: 155 mg/dL — ABNORMAL HIGH (ref 70–99)
Potassium: 3.7 mmol/L (ref 3.5–5.1)
Sodium: 137 mmol/L (ref 135–145)
Total Bilirubin: 0.7 mg/dL (ref 0.3–1.2)
Total Protein: 6.4 g/dL — ABNORMAL LOW (ref 6.5–8.1)

## 2022-04-29 LAB — COMPREHENSIVE METABOLIC PANEL
Albumin: 4.1 (ref 3.5–5.0)
Calcium: 9.2 (ref 8.7–10.7)

## 2022-04-29 LAB — BASIC METABOLIC PANEL
BUN: 42 — AB (ref 4–21)
CO2: 25 — AB (ref 13–22)
Chloride: 105 (ref 99–108)
Creatinine: 1.5 — AB (ref 0.5–1.1)
Glucose: 160
Potassium: 3.7 mEq/L (ref 3.5–5.1)
Sodium: 136 — AB (ref 137–147)
eGFR: 33

## 2022-04-29 LAB — CBC W DIFFERENTIAL (~~LOC~~ CC SCANNED REPORT)

## 2022-04-29 LAB — CBC AND DIFFERENTIAL
HCT: 37 (ref 36–46)
Hemoglobin: 12.5 (ref 12.0–16.0)
Neutrophils Absolute: 15.66
Platelets: 209 10*3/uL (ref 150–400)
WBC: 19.1

## 2022-04-29 LAB — HEPATIC FUNCTION PANEL
ALT: 27 U/L (ref 7–35)
AST: 67 — AB (ref 13–35)
Alkaline Phosphatase: 79 (ref 25–125)
Bilirubin, Total: 0.5

## 2022-04-29 LAB — TSH: TSH: 0.41 u[IU]/mL (ref 0.350–4.500)

## 2022-04-29 LAB — CBC: RBC: 4.23 (ref 3.87–5.11)

## 2022-04-29 NOTE — Progress Notes (Signed)
Patient going out of town from 05/17/22 to 05/23/22.

## 2022-04-29 NOTE — Progress Notes (Signed)
[facility]  [address] [place],  [place]  [address] [phone]  Clinic Day: 04/29/2022  Referring physician: Philemon Kingdom, MD   ASSESSMENT & PLAN:  Gastric adenocarcinoma This is a poorly differentiated adenocarcinoma with signet ring features and ulceration. Stain for HER2 was negative at 0. I would consider it as metastatic to the omentum and so that would stage this as a T4 N0 M1, stage IVB.  She has been on treatment with chemotherapy with FOLFOX and nivolumab for 10 cycles with very good response, but then had increasing dermatologic toxicities and neuropathy. Her PET scan from 03/22/2022 shows a new area of hypermetabolic activity in the right lower lobe of peripheral consolidation, which is more likely atelectasis or resolving pneumonia measuring 3cm. The thickening of her stomach has decreased with decreased metabolic activity and there is shrinkage of the omental metastasis from 72mm to 50mm. The last time she received chemotherapy alone she had severe rash and fever and was admitted to the hospital, but she was also found to have pneumonia and a UTI at that time. We are not sure which medication caused her rashes but she tolerated her immunotherapy alone without a problem.  We will now plan to administer 5-fluorouracil with leucovorin have permanently stopped the oxaliplatin.   Renal Dysfunction Her creatinine was elevated at 1.62 last time and she was given 1 liter of normal saline and encouraged to drink more fluids. However, it went up to 2.2 with a Bun of 46 and is likely prerenal. I will therefore bring her in for IV fluids.  Renal ultrasound was normal.  Today's creatinine is improved at 1.50 with a BUN of 42.  I placed her on prednisone at the end of February at 20 mg twice daily but she misunderstood the instructions and has been taking 10 mg twice daily with good response.  I think we are ready to start tapering this and I have written  out instructions to decrease by 5 mg every 10 days.  Peripheral neuropathy The neuropathy in her fingers and toes has gotten worse even after we stopped Oxaliplatin   Plan Her esophagogastroduodenoscopy with biopsy reveals  a 3.0 cm distal body gastric mass (which has decreased in size but still remains) and a few small proximal body gastric ulcers biopsied.  We will proceed with chemotherapy without the immunotherapy on March 18.  I will slowly taper her prednisone.  She does have a leukocytosis with a white count of 19.1 and a left shift, which I think is caused from the corticosteroids.  [name] back in 3 weeks with CBC and CMP so that she can enjoy her Easter vacation. After this we will go back to every 2 weeks.  I reviewed all of this with her and her family and answered their questions. She understands and agrees with this plan.   I provided 25 minutes of face-to-face time during this this encounter and > 50% was spent counseling as documented under my assessment and plan.   Dellia Beckwith, MD [facility] AT [facility] [address] [place] [address] Dept: [phone] Dept Fax: [phone]       CHIEF COMPLAINT:  CC: Gastric cancer  Current Treatment: Chemotherapy/immunotherapy   HISTORY OF PRESENT ILLNESS:  Mackenzie Lewis is a 87 y.o. female with a history of gastric cancer who is referred in consultation with Dr. Shara Blazing for assessment and management.  She  had noticed that she was having regurgitation when eating and had lost over 30 pounds.  An ultrasound was done, revealing hepatic steatosis, and led to an MRI scan on June 5 which revealed gastric wall thickening with confluent nodularity of the omentum anterior to the stomach measuring 3.5 cm consistent with metastatic tumor.  She also had low-grade edema and wall thickening extending into the duodenum from the stomach.  She was referred to Dr. Shara Blazing and he did an EGD on July 7.  This revealed a large ulceration measuring 1.2 cm along the greater curvature.  She also had diffusely edematous and erythematous wall with erosions of the antrum and stiff and friable mucosa with oozing of blood.  These findings extended to the gastric fundus as well.  Pathology revealed a poorly differentiated adenocarcinoma with signet ring features from the biopsies of the ulcer as well as the antrum and the fundus of the stomach.  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  She was placed on omeprazole.  Her test for H. pylori was negative.  She was referred to Dr. Hardin Negus for consideration of surgery but he felt this was not resectable because of the extensive involvement and I agree.  I would consider this extending to the duodenum and also metastatic to the omentum.  PET scan confirmed these findings and she wished to pursue systemic intravenous therapy.  She continues to eat and has adjusted her diet to softer foods and liquids and is drinking boost so she has maintained her weight.  She does have some Zofran ODT for nausea when needed.  She has had some anorexia, nausea, occasional vomiting, early satiety, dysphagia, and belching.  A CEA and CA 19-9 were normal. She has been started on FOLFOX chemotherapy along with immunotherapy.  I have reviewed her chart and materials related to her cancer extensively and collaborated history with the patient. Summary of oncologic history is as follows: Oncology History  Gastric cancer  09/10/2021 Initial Diagnosis   Gastric cancer (HCC)   09/10/2021 Cancer Staging   Staging form: Stomach, AJCC 8th Edition - Clinical stage from 09/10/2021: Stage IVB (cT4b, cN0, cM1) - Signed by Dellia Beckwith, MD on 09/10/2021 Histopathologic type: Adenocarcinoma, NOS Stage prefix: Initial diagnosis Total positive nodes: 0 Histologic grade (G): G3 Histologic grading system:  3 grade system Sites of metastasis: Peritoneal surface Diagnostic confirmation: Positive histology PLUS positive immunophenotyping and/or positive genetic studies Specimen type: Endoscopy with Biopsy Staged by: Managing physician Carcinoembryonic antigen (CEA) (ng/mL): 2.8 Carbohydrate antigen 19-9 (CA 19-9) (U/mL): 4.9 HER2 status: Unknown Microsatellite instability (MSI): Unknown Tumor location in stomach: Other Clinical staging modalities: Biopsy, Endoscopy Stage used in treatment planning: Yes National guidelines used in treatment planning: Yes Type of national guideline used in treatment planning: NCCN   09/28/2021 - 10/14/2021 Chemotherapy   Patient is on Treatment Plan : GASTROESOPHAGEAL FOLFOX + Nivolumab q14d     09/28/2021 -  Chemotherapy   Patient is on Treatment Plan : GASTROESOPHAGEAL FOLFOX + Nivolumab q14d     12/09/2021 Genetic Testing   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  Report date is 12/09/2021.   The Multi-Cancer + RNA Panel offered by [facility] includes sequencing and/or deletion/duplication analysis of the following 84 genes:  AIP*, ALK, APC*, ATM*, AXIN2*, BAP1*, BARD1*, BLM*, BMPR1A*, BRCA1*, BRCA2*, BRIP1*, CASR, CDC73*, CDH1*, CDK4, CDKN1B*, CDKN1C*, CDKN2A, CEBPA, CHEK2*, CTNNA1*, DICER1*, DIS3L2*, EGFR, EPCAM, FH*, FLCN*, GATA2*, GPC3,  GREM1, HOXB13, HRAS, KIT, MAX*, MEN1*, MET, MITF, MLH1*, MSH2*, MSH3*, MSH6*, MUTYH*, NBN*, NF1*, NF2*, NTHL1*, PALB2*, PDGFRA, PHOX2B, PMS2*, POLD1*, POLE*, POT1*, PRKAR1A*, PTCH1*, PTEN*, RAD50*, RAD51C*, RAD51D*, RB1*, RECQL4, RET, RUNX1*, SDHA*, SDHAF2*, SDHB*, SDHC*, SDHD*, SMAD4*, SMARCA4*, SMARCB1*, SMARCE1*, STK11*, SUFU*, TERC, TERT, TMEM127*, Tp53*, TSC1*, TSC2*, VHL*, WRN*, and WT1.  RNA analysis is performed for * genes.     INTERVAL HISTORY:  Mychal is here today for a follow-up for her gastric cancer. She states she is doing fine. WBC is elevated and this may be because  of the prednisone 10 mg twice a day. Otherwise CBC looks good. Creatinine is 1.5 with a BUN of 42, otherwise the rest is unremarkable. Her esophagogastroduodenoscopy with biopsy reveals  a 3.0 cm distal body gastric mass (which has decreased in size but still remains) and a few small proximal body gastric ulcers biopsied. She is experiencing neuropathy in both hands and feet. She denies signs of infection such as sore throat, sinus drainage, cough, or urinary symptoms.  She denies fevers or recurrent chills. She denies pain. She denies nausea, vomiting, chest pain, dyspnea or cough. Her appetite is good and her weight has decreased 1 pounds over last week .She notes that she has starting exercising and sleeping much better.    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  If that happens, she is to call us so that we can schedule her for an ultrasound to look for synovitis.     Essential hypertension 09/09/2016   Gastric cancer (HCC) 09/10/2021   Hyperlipidemia 09/01/2016   Hypertension    Hypothyroidism 09/01/2016   Osteoarthritis of both feet 01/20/2016   Osteoarthritis, hand 01/20/2016   Thyroid disease   Degenerative disc disease History of endometriosis  Past Surgical History:  Procedure Laterality Date   APPENDECTOMY     LAPAROSCOPIC APPENDECTOMY N/A 04/10/2016   Procedure: APPENDECTOMY LAPAROSCOPIC;  Surgeon: Abigail Miyamoto, MD;  Location: [facility] OR;  Service:  General;  Laterality: N/A;  Bilateral tubal ligation Total hysterectomy and bilateral salpingo-oophorectomy in 1980  Family History  Problem Relation Age of Onset   Hypertension Mother    Prostate cancer Father        metastatic; d. 9   Brain cancer Sister 5   Breast cancer Sister 64   AAA (abdominal aortic aneurysm) Brother    Leukemia Cousin        x2 maternal female cousins; d. before 74   Breast cancer Daughter 46       DCIS  Her sister has had breast cancer as well as a tumor of her head Her daughter has had breast cancer last year  Social History:  reports that she has never smoked. She has never used smokeless tobacco. She reports that she does not currently use alcohol. She reports that she does not use drugs.The patient is accompanied by her son, daughter-in-law and daughter today.  She is single but lives with a significant other.  She has  the 2 children.  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  She grew up in [place] and [place].  She is active and healthy, especially for her age.  Allergies:  Allergies  Allergen Reactions   Doxycycline Rash   Sulfa Antibiotics Rash and Other (See Comments)    Other reaction(s): Other (See Comments)  "Made me feel weird"    Current Medications: Current Outpatient Medications  Medication Sig Dispense Refill   aspirin 81 MG EC tablet Take 81 mg by mouth daily. Swallow whole.     Calcium Carbonate (CALCIUM 600 PO) Take 1 tablet by mouth daily.     Cholecalciferol (VITAMIN D3) 5000 units CAPS Take 1 capsule by mouth daily.     EXFORGE HCT 5-160-12.5 MG TABS Take 1 tablet by mouth daily.     famotidine (PEPCID) 40 MG tablet Take 40 mg by mouth at bedtime.     KRILL OIL PO Take 1 capsule by mouth daily. Unknown strenght     Lactobacillus TABS Take 1 tablet by mouth 2 (two) times daily.     levothyroxine (SYNTHROID, LEVOTHROID) 75 MCG tablet Take 75 mcg by mouth daily before breakfast.      magic mouthwash SOLN  SWISH AND SWALLOW BY MOUTH EVERY THREE TO FOUR HOURS     NON FORMULARY MMW: 3 parts Maalox 2 parts Benadryl 1 part viscious lidicaine  Disp. 6oz  Instructions: 5ml swish and swallow every 3-4 hours     omeprazole (PRILOSEC) 40 MG capsule Take 1 capsule (40 mg total) by mouth 2 (two) times daily. 60 capsule 5   ondansetron (ZOFRAN) 4 MG tablet Take 4 mg by mouth every 4 (four) hours as needed.     ondansetron (ZOFRAN-ODT) 4 MG disintegrating tablet Take 1 tablet (4 mg total) by mouth every 8 (eight) hours as needed for nausea or vomiting. 90 tablet 0   polyethylene glycol powder (GLYCOLAX/MIRALAX) 17 GM/SCOOP powder SMARTSIG:1 scoopful By Mouth Daily     potassium chloride SA (KLOR-CON M) 20 MEQ tablet Take 1 tablet (20 mEq total) by mouth daily. 30 tablet 5   pravastatin (PRAVACHOL) 20 MG tablet Take 1 tablet (20 mg total) by mouth every evening. 90 tablet 3   predniSONE (DELTASONE) 10 MG tablet Take 2 tablets (20 mg total) by mouth 2 (two) times daily with a meal. (Patient taking differently: Take 10 mg by mouth 2 (two) times daily with a meal. One 10mg  tablet twice a day) 100 tablet 1   Probiotic Product (PROBIOTIC DAILY PO) Take 1 tablet by mouth daily.     prochlorperazine (COMPAZINE) 10 MG tablet Take 1 tablet (10 mg total) by mouth every 6 (six) hours as needed for nausea or vomiting. 90 tablet 3   triamcinolone 0.1% oint-Eucerin equivalent cream 1:1 mixture Apply topically 2 (two) times daily. 160 g 0   triamcinolone cream (KENALOG) 0.1 % Apply to affected skin twice daily (Patient taking differently: daily as needed. Apply to affected skin twice daily) 160 g 0   No current facility-administered medications for this visit.    REVIEW OF SYSTEMS:  Review of Systems  Constitutional: Negative.  Negative for appetite change, chills, diaphoresis, fatigue, fever and unexpected weight change.  HENT:  Negative.  Negative for hearing loss, lump/mass, mouth sores, nosebleeds, sore throat,  tinnitus, trouble swallowing and voice change.   Eyes: Negative.  Negative for eye problems and icterus.  Respiratory: Negative.  Negative for chest tightness, cough, hemoptysis, shortness of breath  and wheezing.   Cardiovascular: Negative.  Negative for chest pain, leg swelling and palpitations.  Gastrointestinal:  Positive for constipation. Negative for abdominal distention, abdominal pain, blood in stool, diarrhea, nausea, rectal pain and vomiting.  Endocrine: Negative.  Negative for hot flashes.  Genitourinary: Negative.  Negative for bladder incontinence, difficulty urinating, dyspareunia, dysuria, frequency, hematuria, menstrual problem, nocturia, pelvic pain, vaginal bleeding and vaginal discharge.        Slow urine stream.  Musculoskeletal: Negative.  Negative for arthralgias, back pain, flank pain, gait problem, myalgias, neck pain and neck stiffness.  Skin: Negative.  Negative for itching, rash and wound.       Persistent scars of the lower extremities at previous rash site.  Neurological:  Positive for light-headedness and numbness (in her fingers and toes). Negative for dizziness, extremity weakness, gait problem, headaches, seizures and speech difficulty.  Hematological: Negative.  Negative for adenopathy. Does not bruise/bleed easily.  Psychiatric/Behavioral: Negative.  Negative for confusion, decreased concentration, depression, sleep disturbance and suicidal ideas. The patient is not nervous/anxious.     VITALS:  Blood pressure (!) 145/69, pulse 88, temperature 98.7 F (37.1 C), temperature source Oral, resp. rate 18, height 5\' 4"  (1.626 m), weight 188 lb 9.6 oz (85.5 kg), SpO2 98 %.  Wt Readings from Last 3 Encounters:  05/27/22 194 lb 6.4 oz (88.2 kg)  05/13/22 191 lb 1.9 oz (86.7 kg)  05/07/22 189 lb 4 oz (85.8 kg)    Body mass index is 32.37 kg/m.  Performance status (ECOG): 1 - Symptomatic but completely ambulatory  PHYSICAL EXAM:  Physical Exam Vitals and nursing  note reviewed. Exam conducted with a chaperone present.  Constitutional:      General: She is not in acute distress.    Appearance: Normal appearance. She is normal weight. She is not ill-appearing, toxic-appearing or diaphoretic.  HENT:     Head: Normocephalic and atraumatic.     Right Ear: Tympanic membrane, ear canal and external ear normal. There is no impacted cerumen.     Left Ear: Tympanic membrane, ear canal and external ear normal. There is no impacted cerumen.     Nose: Nose normal. No congestion or rhinorrhea.     Mouth/Throat:     Mouth: Mucous membranes are moist.     Pharynx: Oropharynx is clear. No oropharyngeal exudate or posterior oropharyngeal erythema.  Eyes:     General: No scleral icterus.       Right eye: No discharge.        Left eye: No discharge.     Extraocular Movements: Extraocular movements intact.     Conjunctiva/sclera: Conjunctivae normal.     Pupils: Pupils are equal, round, and reactive to light.  Neck:     Vascular: No carotid bruit.  Cardiovascular:     Rate and Rhythm: Normal rate and regular rhythm.     Pulses: Normal pulses.     Heart sounds: Normal heart sounds. No murmur heard.    No friction rub. No gallop.  Pulmonary:     Effort: Pulmonary effort is normal. No respiratory distress.     Breath sounds: Normal breath sounds. No stridor. No wheezing, rhonchi or rales.  Chest:     Chest wall: No tenderness.  Abdominal:     General: Bowel sounds are normal. There is no distension.     Palpations: Abdomen is soft. There is no hepatomegaly, splenomegaly or mass.     Tenderness: There is no abdominal tenderness. There is no right CVA  tenderness, left CVA tenderness, guarding or rebound.     Hernia: No hernia is present.     Comments: She still has mild firmness across the upper mid abdomen.  She has a linear firm are area across the upper gastric area  Musculoskeletal:        General: No swelling, tenderness, deformity or signs of injury.  Normal range of motion.     Cervical back: Normal range of motion and neck supple. No rigidity or tenderness.     Right lower leg: No edema (trace).     Left lower leg: No edema (trace).     Comments: Fading scars on both lower legs   Lymphadenopathy:     Cervical: No cervical adenopathy.     Right cervical: No superficial, deep or posterior cervical adenopathy.    Left cervical: No superficial, deep or posterior cervical adenopathy.     Upper Body:     Right upper body: No supraclavicular, axillary or pectoral adenopathy.     Left upper body: No supraclavicular, axillary or pectoral adenopathy.  Skin:    General: Skin is warm and dry.     Coloration: Skin is not jaundiced or pale.     Findings: No bruising, erythema, lesion or rash.     Comments: She has persistent scars on the lower extremities.   Neurological:     General: No focal deficit present.     Mental Status: She is alert and oriented to person, place, and time. Mental status is at baseline.     Cranial Nerves: No cranial nerve deficit.     Sensory: No sensory deficit.     Motor: No weakness.     Coordination: Coordination normal.     Gait: Gait normal.     Deep Tendon Reflexes: Reflexes normal.  Psychiatric:        Mood and Affect: Mood normal.        Behavior: Behavior normal.        Thought Content: Thought content normal.        Judgment: Judgment normal.    LABS:      Latest Ref Rng & Units 05/27/2022   12:00 AM 04/29/2022   12:00 AM 04/19/2022   12:00 AM  CBC  WBC  11.5     19.1     16.5      Hemoglobin 12.0 - 16.0 12.5     12.5     13.0      Hematocrit 36 - 46 36     37     38      Platelets 150 - 400 K/uL 155     209     260         This result is from an external source.      Latest Ref Rng & Units 05/27/2022   12:00 AM 04/29/2022    2:33 PM 04/29/2022   12:00 AM  CMP  Glucose 70 - 99 mg/dL  161    BUN 4 - 21 20     41  42      Creatinine 0.5 - 1.1 1.0     1.67  1.5      Sodium 137 - 147 136      137  136      Potassium 3.5 - 5.1 mEq/L 4.2     3.7  3.7      Chloride 99 - 108 105     103  105  CO2 13 - 22 21     24  25       Calcium 8.7 - 10.7 9.8     9.2  9.2      Total Protein 6.5 - 8.1 g/dL  6.4    Total Bilirubin 0.3 - 1.2 mg/dL  0.7    Alkaline Phos 25 - 125 76     60  79      AST 13 - 35 29     23  67      ALT 7 - 35 U/L 28     24  27          This result is from an external source.   Lab Results  Component Value Date   CEA1 2.9 09/10/2021   /  CEA  Date Value Ref Range Status  09/10/2021 2.9 0.0 - 4.7 ng/mL Final    Comment:    (NOTE)                             Nonsmokers          <3.9                             Smokers             <5.6 Roche [employer] Electrochemiluminescence Immunoassay (ECLIA) Values obtained with different assay methods or kits cannot be used interchangeably.  Results cannot be interpreted as absolute evidence of the presence or absence of malignant disease. Performed At: [facility] [address] [place], [place] [id number] Jolene Schimke MD WU:[id number]    Component Ref Range & Units 03/22/2022 6 mo ago  Glucose-Capillary 70 - 99 mg/dL 96 956 High  CM   Component Ref Range & Units 2 d ago (04/07/22) 2 wk ago (03/24/22) 1 mo ago (02/16/22) 2 mo ago (01/28/22) 2 mo ago (01/14/22) 3 mo ago (12/31/21) 4 mo ago (12/03/21)  TSH 0.350 - 4.500 uIU/mL 1.895 1.835 CM 2.087 CM 1.773 CM 2.604 CM 1.987 CM 1.657 CM     Component Ref Range & Units 2 d ago (04/07/22) 2 wk ago (03/24/22) 1 mo ago (02/16/22) 2 mo ago (01/28/22) 2 mo ago (01/14/22) 3 mo ago (12/31/21) 4 mo ago (12/03/21)  T4, Total 4.5 - 12.0 ug/dL 21.3 08.6 CM 57.8 High  CM 14.6 High  CM 13.2 High  CM 12.3 High  CM 13.4 High  CM     No results found for: "PSA1" No results found for: "ION629" No results found for: "CAN125"  No results found for: "TOTALPROTELP", "ALBUMINELP", "A1GS", "A2GS", "BETS", "BETA2SER", "GAMS", "MSPIKE", "SPEI" No results found for:  "TIBC", "FERRITIN", "IRONPCTSAT" No results found for: "LDH"  STUDIES:  No results found.  EXAM: 03/22/2022 NUCLEAR MEDICINE PET SKULL BASE TO THIGH IMPRESSION: 1. Resolution of metabolic activity associated with the stomach. 2. Decrease in size of omental nodularity. No associated metabolic activity. 3. No evidence of new or progressive gastric carcinoma. 4. New peripheral consolidation in the RIGHT lower lobe with moderate metabolic activity. Favor focus of atelectasis versus less likely pneumonia. 5.  Aortic Atherosclerosis (ICD10-I70.0).  EXAM:12/16/21 CT CHEST, ABDOMEN, AND PELVIS WITH CONTRAST IMPRESSION: Response to therapy of gastric primary and adjacent omental nodularity. No new or progressive disease. Right larger than left lower lobe pulmonary nodules, felt to be similar to the 04/10/2016 remote CT and therefore benign. Progressive interstitial lung disease, right  greater than left. This could be postinfectious/inflammatory or less likely represent nonspecific interstitial pneumonitis. Improvement in common duct dilation, likely incidental given chronicity.   EXAM:09/18/2021 NUCLEAR MEDICINE PET SKULL BASE TO THIGH NUCLEAR MEDICINE PET SKULL BASE TO THIGH IMPRESSION: 1. Evaluation of the gastric wall is limited by minimal distension, within this context, here is diffuse hypermetabolic activity within the stomach with similar diffuse nonspecific gastric wall thickening and no significant change in the appearance of the possible anterior gastric body ulceration. 2. Mildly metabolic omental nodularity anterior and inferior to the stomach, likely reflects omental disease involvement. 3. No convincing evidence of hypermetabolic metastatic disease in the neck, chest, or pelvis. 4. Similar mild intrahepatic and moderate extrahepatic biliary ductal dilation with the common duct measuring 13 mm no suspicious hypermetabolic lesion identified within the duct and no  discrete lesion identified on prior MRI dated July 20, 2021. 5.  Aortic Atherosclerosis (ICD10-I70.0).     I,Gabriella Ballesteros,acting as a scribe for Dellia Beckwith, MD.,have documented all relevant documentation on the behalf of Dellia Beckwith, MD,as directed by  Dellia Beckwith, MD while in the presence of Dellia Beckwith, MD.

## 2022-04-29 NOTE — Progress Notes (Signed)
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 Hinton Rao.

## 2022-04-30 LAB — T4: T4, Total: 7.6 ug/dL (ref 4.5–12.0)

## 2022-04-30 MED FILL — Dexamethasone Sodium Phosphate Inj 100 MG/10ML: INTRAMUSCULAR | Qty: 1 | Status: AC

## 2022-04-30 MED FILL — Fluorouracil IV Soln 2.5 GM/50ML (50 MG/ML): INTRAVENOUS | Qty: 13 | Status: AC

## 2022-04-30 MED FILL — Fluorouracil IV Soln 5 GM/100ML (50 MG/ML): INTRAVENOUS | Qty: 77 | Status: AC

## 2022-05-03 ENCOUNTER — Inpatient Hospital Stay: Payer: Medicare Other

## 2022-05-03 ENCOUNTER — Encounter: Payer: Self-pay | Admitting: Oncology

## 2022-05-03 VITALS — BP 148/68 | HR 87 | Temp 97.4°F | Resp 18 | Ht 64.0 in | Wt 188.0 lb

## 2022-05-03 DIAGNOSIS — Z5111 Encounter for antineoplastic chemotherapy: Secondary | ICD-10-CM | POA: Diagnosis not present

## 2022-05-03 DIAGNOSIS — C169 Malignant neoplasm of stomach, unspecified: Secondary | ICD-10-CM | POA: Diagnosis not present

## 2022-05-03 DIAGNOSIS — C168 Malignant neoplasm of overlapping sites of stomach: Secondary | ICD-10-CM

## 2022-05-03 DIAGNOSIS — Z79899 Other long term (current) drug therapy: Secondary | ICD-10-CM | POA: Diagnosis not present

## 2022-05-03 MED ORDER — SODIUM CHLORIDE 0.9 % IV SOLN
1955.0000 mg/m2 | INTRAVENOUS | Status: DC
  Administered 2022-05-03: 3850 mg via INTRAVENOUS
  Filled 2022-05-03: qty 77

## 2022-05-03 MED ORDER — SODIUM CHLORIDE 0.9 % IV SOLN
10.0000 mg | Freq: Once | INTRAVENOUS | Status: AC
  Administered 2022-05-03: 10 mg via INTRAVENOUS
  Filled 2022-05-03: qty 10

## 2022-05-03 MED ORDER — DEXTROSE 5 % IV SOLN: Freq: Once | INTRAVENOUS | Status: AC

## 2022-05-03 MED ORDER — LEUCOVORIN CALCIUM INJECTION 350 MG
320.0000 mg/m2 | Freq: Once | INTRAVENOUS | Status: AC
  Administered 2022-05-03: 630 mg via INTRAVENOUS
  Filled 2022-05-03: qty 31.5

## 2022-05-03 MED ORDER — PALONOSETRON HCL INJECTION 0.25 MG/5ML
0.2500 mg | Freq: Once | INTRAVENOUS | Status: AC
  Administered 2022-05-03: 0.25 mg via INTRAVENOUS
  Filled 2022-05-03: qty 5

## 2022-05-03 MED ORDER — FLUOROURACIL CHEMO INJECTION 2.5 GM/50ML
320.0000 mg/m2 | Freq: Once | INTRAVENOUS | Status: AC
  Administered 2022-05-03: 650 mg via INTRAVENOUS
  Filled 2022-05-03: qty 13

## 2022-05-03 NOTE — Patient Instructions (Signed)
[facility]  Discharge Instructions: Thank you for choosing [facility] to provide your oncology and hematology care.  If you have a lab appointment with the Cancer Center, please go directly to the Cancer Center and check in at the registration area.   Wear comfortable clothing and clothing appropriate for easy access to any Portacath or PICC line.   We strive to give you quality time with your provider. You may need to reschedule your appointment if you arrive late (15 or more minutes).  Arriving late affects you and other patients whose appointments are after yours.  Also, if you miss three or more appointments without notifying the office, you may be dismissed from the clinic at the provider's discretion.      For prescription refill requests, have your pharmacy contact our office and allow 72 hours for refills to be completed.    Today you received the following chemotherapy and/or immunotherapy agents Leucovorin, 5fu      To help prevent nausea and vomiting after your treatment, we encourage you to take your nausea medication as directed.  BELOW ARE SYMPTOMS THAT SHOULD BE REPORTED IMMEDIATELY: *FEVER GREATER THAN 100.4 F (38 C) OR HIGHER *CHILLS OR SWEATING *NAUSEA AND VOMITING THAT IS NOT CONTROLLED WITH YOUR NAUSEA MEDICATION *UNUSUAL SHORTNESS OF BREATH *UNUSUAL BRUISING OR BLEEDING *URINARY PROBLEMS (pain or burning when urinating, or frequent urination) *BOWEL PROBLEMS (unusual diarrhea, constipation, pain near the anus) TENDERNESS IN MOUTH AND THROAT WITH OR WITHOUT PRESENCE OF ULCERS (sore throat, sores in mouth, or a toothache) UNUSUAL RASH, SWELLING OR PAIN  UNUSUAL VAGINAL DISCHARGE OR ITCHING   Items with * indicate a potential emergency and should be followed up as soon as possible or go to the Emergency Department if any problems should occur.  Please show the CHEMOTHERAPY ALERT CARD or IMMUNOTHERAPY ALERT CARD at check-in to  the Emergency Department and triage nurse.  Should you have questions after your visit or need to cancel or reschedule your appointment, please contact [facility]  Dept: [phone]  and follow the prompts.  Office hours are 8:00 a.m. to 4:30 p.m. Monday - Friday. Please note that voicemails left after 4:00 p.m. may not be returned until the following business day.  We are closed weekends and major holidays. You have access to a nurse at all times for urgent questions. Please call the main number to the clinic Dept: [phone] and follow the prompts.  For any non-urgent questions, you may also contact your provider using MyChart. We now offer e-Visits for anyone 18 and older to request care online for non-urgent symptoms. For details visit [email or web address].   Also download the MyChart app! Go to the app store, search "MyChart", open the app, select [facility], and log in with your MyChart username and password.  

## 2022-05-05 ENCOUNTER — Inpatient Hospital Stay: Payer: Medicare Other

## 2022-05-05 VITALS — BP 127/73 | HR 93 | Temp 98.0°F | Resp 18 | Ht 64.0 in

## 2022-05-05 DIAGNOSIS — C168 Malignant neoplasm of overlapping sites of stomach: Secondary | ICD-10-CM

## 2022-05-05 DIAGNOSIS — Z79899 Other long term (current) drug therapy: Secondary | ICD-10-CM | POA: Diagnosis not present

## 2022-05-05 DIAGNOSIS — Z5111 Encounter for antineoplastic chemotherapy: Secondary | ICD-10-CM | POA: Diagnosis not present

## 2022-05-05 DIAGNOSIS — C169 Malignant neoplasm of stomach, unspecified: Secondary | ICD-10-CM | POA: Diagnosis not present

## 2022-05-05 MED ORDER — SODIUM CHLORIDE 0.9 % IV SOLN: Freq: Once | INTRAVENOUS | Status: AC

## 2022-05-05 MED ORDER — SODIUM CHLORIDE 0.9% FLUSH
10.0000 mL | INTRAVENOUS | Status: DC | PRN
  Administered 2022-05-05: 10 mL

## 2022-05-05 MED ORDER — HEPARIN SOD (PORK) LOCK FLUSH 100 UNIT/ML IV SOLN
500.0000 [IU] | Freq: Once | INTRAVENOUS | Status: AC | PRN
  Administered 2022-05-05: 500 [IU]

## 2022-05-05 NOTE — Progress Notes (Signed)
0930 Patient is here today for pump dc, but compliant of chest tightness which happened last night; then again early this am. She stated that is relieved by taking an antiacid. She also compliant of dizziness when she stood up or sat up. She does have some numbness and tingling in both her left hand and right. She has stated that the tingling is worse on the left side though. No compliant of shortness of breath, nausea or vomiting. She took all of her medicines this am. Ulice Dash, Pharmd and Dr. Hinton Rao were both  informed of the above. We will give the patient a liter of normal saline now and re-evaluate afterwards.

## 2022-05-05 NOTE — Patient Instructions (Addendum)
Dehydration, Adult Dehydration is a condition in which there is not enough water or other fluids in the body. This happens when a person loses more fluids than they take in. Important organs cannot work right without the right amount of fluids. Any loss of fluids from the body can cause dehydration. Dehydration can be mild, worse, or very bad. It should be treated right away to keep it from getting very bad. What are the causes? Conditions that cause loss of water in the body. They include: Watery poop (diarrhea). Vomiting. Sweating a lot. Fever. Infection. Peeing (urinating) a lot. Not drinking enough fluids. Certain medicines, such as medicines that take extra fluid out of the body (diuretics). Lack of safe drinking water. Not being able to get enough water and food. What increases the risk? Having a long-term (chronic) illness that has not been treated the right way, such as: Diabetes. Heart disease. Kidney disease. Being 65 years of age or older. Having a disability. Living in a place that is high above the ground or sea (high in altitude). The thinner, drier air causes more fluid loss. Doing exercises that put stress on your body for a long time. Being active when in hot places. What are the signs or symptoms? Symptoms of dehydration depend on how bad it is. Mild or worse dehydration Thirst. Dry lips or dry mouth. Feeling dizzy or light-headed. Muscle cramps. Passing little pee or dark pee. Pee may be the color of tea. Headache. Very bad dehydration Changes in skin. Skin may: Be cold to the touch (clammy). Be blotchy or pale. Not go back to normal right after you pinch it and let it go. Little or no tears, pee, or sweat. Fast breathing. Low blood pressure. Weak pulse. Pulse that is more than 100 beats a minute when you are sitting still. Other changes, such as: Feeling very thirsty. Eyes that look hollow (sunken). Cold hands and feet. Being confused. Being very  tired (lethargic) or having trouble waking from sleep. Losing weight. Loss of consciousness. How is this treated? Treatment for this condition depends on how bad your dehydration is. Treatment should start right away. Do not wait until your condition gets very bad. Very bad dehydration is an emergency. You will need to go to a hospital. Mild or worse dehydration can be treated at home. You may be asked to: Drink more fluids. Drink an oral rehydration solution (ORS). This drink gives you the right amount of fluids, salts, and minerals (electrolytes). Very bad dehydration can be treated: With fluids through an IV tube. By correcting low levels of electrolytes in the body. By treating the problem that caused your dehydration. Follow these instructions at home: Oral rehydration solution If told by your doctor, drink an ORS: Make an ORS. Use instructions on the package. Start by drinking small amounts, about  cup (120 mL) every 5-10 minutes. Slowly drink more until you have had the amount that your doctor said to have.  Eating and drinking  Drink enough clear fluid to keep your pee pale yellow. If you were told to drink an ORS, finish the ORS first. Then, start slowly drinking other clear fluids. Drink fluids such as: Water. Do not drink only water. Doing that can make the salt (sodium) level in your body get too low. Water from ice chips you suck on. Fruit juice that you have added water to (diluted). Low-calorie sports drinks. Eat foods that have the right amounts of salts and minerals, such as bananas, oranges, potatoes,   tomatoes, or spinach. Do not drink alcohol. Avoid drinks that have caffeine or sugar. These include:: High-calorie sports drinks. Fruit juice that you did not add water to. Soda. Coffee or energy drinks. Avoid foods that are greasy or have a lot of fat or sugar. General instructions Take over-the-counter and prescription medicines only as told by your doctor. Do  not take sodium tablets. Doing that can make the salt level in your body get too high. Return to your normal activities as told by your doctor. Ask your doctor what activities are safe for you. Keep all follow-up visits. Your doctor may check and change your treatment. Contact a doctor if: You have pain in your belly (abdomen) and the pain: Gets worse. Stays in one place. You have a rash. You have a stiff neck. You get angry or annoyed more easily than normal. You are more tired or have a harder time waking than normal. You feel weak or dizzy. You feel very thirsty. Get help right away if: You have any symptoms of very bad dehydration. You vomit every time you eat or drink. Your vomiting gets worse, does not go away, or you vomit blood or green stuff. You are getting treatment, but symptoms are getting worse. You have a fever. You have a very bad headache. You have: Diarrhea that gets worse or does not go away. Blood in your poop (stool). This may cause poop to look black and tarry. No pee in 6-8 hours. Only a small amount of pee in 6-8 hours, and the pee is very dark. You have trouble breathing. These symptoms may be an emergency. Get help right away. Call 911. Do not wait to see if the symptoms will go away. Do not drive yourself to the hospital. This information is not intended to replace advice given to you by your health care provider. Make sure you discuss any questions you have with your health care provider. Document Revised: 08/31/2021 Document Reviewed: 08/31/2021 Elsevier Patient Education  [place].   The chemotherapy medication bag should finish at 46 hours, 96 hours, or 7 days. For example, if your pump is scheduled for 46 hours and it was put on at 4:00 p.m., it should finish at 2:00 p.m. the day it is scheduled to come off regardless of your appointment time.     Estimated time to finish at 0930.   If the display on your pump reads "Low Volume" and it  is beeping, take the batteries out of the pump and come to the cancer center for it to be taken off.   If the pump alarms go off prior to the pump reading "Low Volume" then call [phone] and someone can assist you.  If the plunger comes out and the chemotherapy medication is leaking out, please use your home chemo spill kit to clean up the spill. Do NOT use paper towels or other household products.  If you have problems or questions regarding your pump, please call either [phone] (24 hours a day) or the cancer center Monday-Friday 8:00 a.m.- 4:30 p.m. at the clinic number and we will assist you. If you are unable to get assistance, then go to the nearest Emergency Department and ask the staff to contact the IV team for assistance.

## 2022-05-06 ENCOUNTER — Encounter: Payer: Self-pay | Admitting: Oncology

## 2022-05-06 ENCOUNTER — Telehealth: Payer: Self-pay

## 2022-05-06 NOTE — Addendum Note (Signed)
Addended by: Juanetta Beets on: 05/06/2022 03:35 PM   Modules accepted: Orders

## 2022-05-06 NOTE — Telephone Encounter (Signed)
TALKED TO THE PATIENT THIS AM REGARDING HER SITUATION YESTERDAY WITH NEEDING IVF. SHE FEELS GOOD TODAY. SHE HAS BEEN TRYING TO DRINK PLENTY OF FLUIDS. SHE DOES NOT HAVE ANY MORE EPISODES OF CHEST TIGHTNESS. SHE WOULD LIKE TO GET MORE FLUIDS ON TOMORROW IF POSSIBLE. I WILL SEND DR. MCCARTY A NOTE REGARDING THIS FOR AN ORDER FOR IV FLUIDS.

## 2022-05-07 ENCOUNTER — Inpatient Hospital Stay: Payer: Medicare Other

## 2022-05-07 VITALS — BP 133/76 | HR 91 | Temp 97.5°F | Resp 18 | Wt 189.2 lb

## 2022-05-07 DIAGNOSIS — Z5111 Encounter for antineoplastic chemotherapy: Secondary | ICD-10-CM | POA: Diagnosis not present

## 2022-05-07 DIAGNOSIS — R7989 Other specified abnormal findings of blood chemistry: Secondary | ICD-10-CM

## 2022-05-07 DIAGNOSIS — C169 Malignant neoplasm of stomach, unspecified: Secondary | ICD-10-CM | POA: Diagnosis not present

## 2022-05-07 DIAGNOSIS — E860 Dehydration: Secondary | ICD-10-CM

## 2022-05-07 DIAGNOSIS — Z79899 Other long term (current) drug therapy: Secondary | ICD-10-CM | POA: Diagnosis not present

## 2022-05-07 MED ORDER — HEPARIN SOD (PORK) LOCK FLUSH 100 UNIT/ML IV SOLN
500.0000 [IU] | Freq: Once | INTRAVENOUS | Status: AC | PRN
  Administered 2022-05-07: 500 [IU]

## 2022-05-07 MED ORDER — SODIUM CHLORIDE 0.9% FLUSH
10.0000 mL | Freq: Once | INTRAVENOUS | Status: AC | PRN
  Administered 2022-05-07: 10 mL

## 2022-05-07 MED ORDER — SODIUM CHLORIDE 0.9 % IV SOLN: Freq: Once | INTRAVENOUS | Status: AC

## 2022-05-07 NOTE — Patient Instructions (Addendum)
Dehydration, Adult Dehydration is a condition in which there is not enough water or other fluids in the body. This happens when a person loses more fluids than they take in. Important organs cannot work right without the right amount of fluids. Any loss of fluids from the body can cause dehydration. Dehydration can be mild, worse, or very bad. It should be treated right away to keep it from getting very bad. What are the causes? Conditions that cause loss of water in the body. They include: Watery poop (diarrhea). Vomiting. Sweating a lot. Fever. Infection. Peeing (urinating) a lot. Not drinking enough fluids. Certain medicines, such as medicines that take extra fluid out of the body (diuretics). Lack of safe drinking water. Not being able to get enough water and food. What increases the risk? Having a long-term (chronic) illness that has not been treated the right way, such as: Diabetes. Heart disease. Kidney disease. Being 65 years of age or older. Having a disability. Living in a place that is high above the ground or sea (high in altitude). The thinner, drier air causes more fluid loss. Doing exercises that put stress on your body for a long time. Being active when in hot places. What are the signs or symptoms? Symptoms of dehydration depend on how bad it is. Mild or worse dehydration Thirst. Dry lips or dry mouth. Feeling dizzy or light-headed. Muscle cramps. Passing little pee or dark pee. Pee may be the color of tea. Headache. Very bad dehydration Changes in skin. Skin may: Be cold to the touch (clammy). Be blotchy or pale. Not go back to normal right after you pinch it and let it go. Little or no tears, pee, or sweat. Fast breathing. Low blood pressure. Weak pulse. Pulse that is more than 100 beats a minute when you are sitting still. Other changes, such as: Feeling very thirsty. Eyes that look hollow (sunken). Cold hands and feet. Being confused. Being very  tired (lethargic) or having trouble waking from sleep. Losing weight. Loss of consciousness. How is this treated? Treatment for this condition depends on how bad your dehydration is. Treatment should start right away. Do not wait until your condition gets very bad. Very bad dehydration is an emergency. You will need to go to a hospital. Mild or worse dehydration can be treated at home. You may be asked to: Drink more fluids. Drink an oral rehydration solution (ORS). This drink gives you the right amount of fluids, salts, and minerals (electrolytes). Very bad dehydration can be treated: With fluids through an IV tube. By correcting low levels of electrolytes in the body. By treating the problem that caused your dehydration. Follow these instructions at home: Oral rehydration solution If told by your doctor, drink an ORS: Make an ORS. Use instructions on the package. Start by drinking small amounts, about  cup (120 mL) every 5-10 minutes. Slowly drink more until you have had the amount that your doctor said to have.  Eating and drinking  Drink enough clear fluid to keep your pee pale yellow. If you were told to drink an ORS, finish the ORS first. Then, start slowly drinking other clear fluids. Drink fluids such as: Water. Do not drink only water. Doing that can make the salt (sodium) level in your body get too low. Water from ice chips you suck on. Fruit juice that you have added water to (diluted). Low-calorie sports drinks. Eat foods that have the right amounts of salts and minerals, such as bananas, oranges, potatoes,   tomatoes, or spinach. Do not drink alcohol. Avoid drinks that have caffeine or sugar. These include:: High-calorie sports drinks. Fruit juice that you did not add water to. Soda. Coffee or energy drinks. Avoid foods that are greasy or have a lot of fat or sugar. General instructions Take over-the-counter and prescription medicines only as told by your doctor. Do  not take sodium tablets. Doing that can make the salt level in your body get too high. Return to your normal activities as told by your doctor. Ask your doctor what activities are safe for you. Keep all follow-up visits. Your doctor may check and change your treatment. Contact a doctor if: You have pain in your belly (abdomen) and the pain: Gets worse. Stays in one place. You have a rash. You have a stiff neck. You get angry or annoyed more easily than normal. You are more tired or have a harder time waking than normal. You feel weak or dizzy. You feel very thirsty. Get help right away if: You have any symptoms of very bad dehydration. You vomit every time you eat or drink. Your vomiting gets worse, does not go away, or you vomit blood or green stuff. You are getting treatment, but symptoms are getting worse. You have a fever. You have a very bad headache. You have: Diarrhea that gets worse or does not go away. Blood in your poop (stool). This may cause poop to look black and tarry. No pee in 6-8 hours. Only a small amount of pee in 6-8 hours, and the pee is very dark. You have trouble breathing. These symptoms may be an emergency. Get help right away. Call 911. Do not wait to see if the symptoms will go away. Do not drive yourself to the hospital. This information is not intended to replace advice given to you by your health care provider. Make sure you discuss any questions you have with your health care provider. Document Revised: 08/31/2021 Document Reviewed: 08/31/2021 Elsevier Patient Education  2023 [employer] Inc.  

## 2022-05-13 ENCOUNTER — Inpatient Hospital Stay: Payer: TRICARE For Life (TFL)

## 2022-05-13 VITALS — BP 147/66 | HR 87 | Temp 98.3°F | Resp 20 | Ht 64.0 in | Wt 191.1 lb

## 2022-05-13 DIAGNOSIS — Z79899 Other long term (current) drug therapy: Secondary | ICD-10-CM | POA: Diagnosis not present

## 2022-05-13 DIAGNOSIS — C169 Malignant neoplasm of stomach, unspecified: Secondary | ICD-10-CM | POA: Diagnosis not present

## 2022-05-13 DIAGNOSIS — Z5111 Encounter for antineoplastic chemotherapy: Secondary | ICD-10-CM | POA: Diagnosis not present

## 2022-05-13 DIAGNOSIS — R7989 Other specified abnormal findings of blood chemistry: Secondary | ICD-10-CM

## 2022-05-13 DIAGNOSIS — E860 Dehydration: Secondary | ICD-10-CM

## 2022-05-13 MED ORDER — HEPARIN SOD (PORK) LOCK FLUSH 100 UNIT/ML IV SOLN
500.0000 [IU] | Freq: Once | INTRAVENOUS | Status: AC | PRN
  Administered 2022-05-13: 500 [IU]

## 2022-05-13 MED ORDER — SODIUM CHLORIDE 0.9 % IV SOLN: Freq: Once | INTRAVENOUS | Status: AC

## 2022-05-13 MED ORDER — SODIUM CHLORIDE 0.9% FLUSH
10.0000 mL | Freq: Once | INTRAVENOUS | Status: AC | PRN
  Administered 2022-05-13: 10 mL

## 2022-05-13 NOTE — Patient Instructions (Signed)
Dehydration, Adult Dehydration is a condition in which there is not enough water or other fluids in the body. This happens when a person loses more fluids than they take in. Important organs cannot work right without the right amount of fluids. Any loss of fluids from the body can cause dehydration. Dehydration can be mild, worse, or very bad. It should be treated right away to keep it from getting very bad. What are the causes? Conditions that cause loss of water in the body. They include: Watery poop (diarrhea). Vomiting. Sweating a lot. Fever. Infection. Peeing (urinating) a lot. Not drinking enough fluids. Certain medicines, such as medicines that take extra fluid out of the body (diuretics). Lack of safe drinking water. Not being able to get enough water and food. What increases the risk? Having a long-term (chronic) illness that has not been treated the right way, such as: Diabetes. Heart disease. Kidney disease. Being 65 years of age or older. Having a disability. Living in a place that is high above the ground or sea (high in altitude). The thinner, drier air causes more fluid loss. Doing exercises that put stress on your body for a long time. Being active when in hot places. What are the signs or symptoms? Symptoms of dehydration depend on how bad it is. Mild or worse dehydration Thirst. Dry lips or dry mouth. Feeling dizzy or light-headed. Muscle cramps. Passing little pee or dark pee. Pee may be the color of tea. Headache. Very bad dehydration Changes in skin. Skin may: Be cold to the touch (clammy). Be blotchy or pale. Not go back to normal right after you pinch it and let it go. Little or no tears, pee, or sweat. Fast breathing. Low blood pressure. Weak pulse. Pulse that is more than 100 beats a minute when you are sitting still. Other changes, such as: Feeling very thirsty. Eyes that look hollow (sunken). Cold hands and feet. Being confused. Being very  tired (lethargic) or having trouble waking from sleep. Losing weight. Loss of consciousness. How is this treated? Treatment for this condition depends on how bad your dehydration is. Treatment should start right away. Do not wait until your condition gets very bad. Very bad dehydration is an emergency. You will need to go to a hospital. Mild or worse dehydration can be treated at home. You may be asked to: Drink more fluids. Drink an oral rehydration solution (ORS). This drink gives you the right amount of fluids, salts, and minerals (electrolytes). Very bad dehydration can be treated: With fluids through an IV tube. By correcting low levels of electrolytes in the body. By treating the problem that caused your dehydration. Follow these instructions at home: Oral rehydration solution If told by your doctor, drink an ORS: Make an ORS. Use instructions on the package. Start by drinking small amounts, about  cup (120 mL) every 5-10 minutes. Slowly drink more until you have had the amount that your doctor said to have.  Eating and drinking  Drink enough clear fluid to keep your pee pale yellow. If you were told to drink an ORS, finish the ORS first. Then, start slowly drinking other clear fluids. Drink fluids such as: Water. Do not drink only water. Doing that can make the salt (sodium) level in your body get too low. Water from ice chips you suck on. Fruit juice that you have added water to (diluted). Low-calorie sports drinks. Eat foods that have the right amounts of salts and minerals, such as bananas, oranges, potatoes,   tomatoes, or spinach. Do not drink alcohol. Avoid drinks that have caffeine or sugar. These include:: High-calorie sports drinks. Fruit juice that you did not add water to. Soda. Coffee or energy drinks. Avoid foods that are greasy or have a lot of fat or sugar. General instructions Take over-the-counter and prescription medicines only as told by your doctor. Do  not take sodium tablets. Doing that can make the salt level in your body get too high. Return to your normal activities as told by your doctor. Ask your doctor what activities are safe for you. Keep all follow-up visits. Your doctor may check and change your treatment. Contact a doctor if: You have pain in your belly (abdomen) and the pain: Gets worse. Stays in one place. You have a rash. You have a stiff neck. You get angry or annoyed more easily than normal. You are more tired or have a harder time waking than normal. You feel weak or dizzy. You feel very thirsty. Get help right away if: You have any symptoms of very bad dehydration. You vomit every time you eat or drink. Your vomiting gets worse, does not go away, or you vomit blood or green stuff. You are getting treatment, but symptoms are getting worse. You have a fever. You have a very bad headache. You have: Diarrhea that gets worse or does not go away. Blood in your poop (stool). This may cause poop to look black and tarry. No pee in 6-8 hours. Only a small amount of pee in 6-8 hours, and the pee is very dark. You have trouble breathing. These symptoms may be an emergency. Get help right away. Call 911. Do not wait to see if the symptoms will go away. Do not drive yourself to the hospital. This information is not intended to replace advice given to you by your health care provider. Make sure you discuss any questions you have with your health care provider. Document Revised: 08/31/2021 Document Reviewed: 08/31/2021 Elsevier Patient Education  2023 [employer] Inc.  

## 2022-05-13 NOTE — Addendum Note (Signed)
Addended by: Juanetta Beets on: 05/13/2022 10:06 AM   Modules accepted: Orders

## 2022-05-14 ENCOUNTER — Encounter: Payer: Self-pay | Admitting: Oncology

## 2022-05-26 NOTE — Progress Notes (Signed)
[name]  [address] [place],  [place]  [address] [phone]  Clinic Day: 05/27/22  Referring physician: Philemon Kingdom, MD  ASSESSMENT & PLAN:  Assessment: Gastric adenocarcinoma This is a poorly differentiated adenocarcinoma with signet ring features and ulceration. Stain for HER2 was negative at 0. I would consider it as metastatic to the omentum and so that would stage this as a T4 N0 M1, stage IVB.  She has been on treatment with chemotherapy with FOLFOX and nivolumab for 10 cycles with very good response, but then had increasing dermatologic toxicities and neuropathy. Her PET scan from 03/22/2022 shows a new area of hypermetabolic activity in the right lower lobe of peripheral consolidation, which is likely atelectasis or resolving pneumonia measuring 3cm. The thickening of her stomach has decreased with decreased metabolic activity and there is shrinkage of the omental metastasis from 22 mm to 13 mm. The last time she received chemotherapy alone she had severe rash and fever and was admitted to the hospital, but she was also found to have pneumonia and a UTI at that time. We are not sure which medication caused her rashes. Dr. Jennye Boroughs did a EGD on March, 5th and found persistent poorly differentiated adenocarcinoma and a 3 cm mass of the distal body of the stomach. Since her current treatment is palliating her disease, we will continue.    Immune Mediated Nephritis Her creatinine is improved at 1.6 today and she will receive 1 liter of normal saline tomorrow and was encouraged to drink more fluids. Renal ultrasound was normal. We felt this was caused by her immunotherapy and placed her on Prednisone. I had ordered 10mg  tablets to take 20mg  twice daily but the patient misunderstood and has only been taking 10mg  twice daily. We have steadily tapered the Prednisone dose with good response and she is down to 5mg  daily with a creatinine of 1.0. I  will continue to taper her off completely.    Plan We had placed her on 10 mg twice daily of Prednisone for immune mediated renal dysfunction and we have steadily tapered the dose with good results. She is down to half a pill of Prednisone I advised her on Sunday to switch to taking one half every other day for a few more days. Her day 1 cycle 13 of FOLFOX is scheduled on 05/31/2022 and day 3 pump D/C, cycle 13 is scheduled on 06/01/2022. She will have her pump removed on 06/02/2022. We have permanently stopped the Oxaliplatin. We will now add back the immunotherapy with her chemo to see if she can tolerate the combination again. We will also give her IV fluids on both days. Her CBC and CMP is normal including a creatinine of 1.0 as of today. [name] back in 2 weeks with CBC and CMP.  I reviewed all of this with her and her daughter and answered their questions. She understands and agrees with this plan.   I provided 20 minutes of face-to-face time during this this encounter and > 50% was spent counseling as documented under my assessment and plan.   Dellia Beckwith, MD [name] [address] [place] [address] Dept: [phone] Dept Fax: [phone]     CHIEF COMPLAINT:  CC: Gastric cancer  Current Treatment: Chemotherapy/immunotherapy   HISTORY OF PRESENT ILLNESS:  Mackenzie Lewis is a 87 y.o. female with a history of gastric cancer who is referred in  consultation with Dr. Shara Blazing for assessment and management.  She had noticed that she was having regurgitation when eating and had lost over 30 pounds.  An ultrasound was done, revealing hepatic steatosis, and led to an MRI scan on June 5 which revealed gastric wall thickening with confluent nodularity of the omentum anterior to the stomach measuring 3.5 cm consistent with metastatic tumor.  She also had low-grade edema and wall thickening extending into  the duodenum from the stomach.  She was referred to Dr. Shara Blazing and he did an EGD on July 7.  This revealed a large ulceration measuring 1.2 cm along the greater curvature.  She also had diffusely edematous and erythematous wall with erosions of the antrum and stiff and friable mucosa with oozing of blood.  These findings extended to the gastric fundus as well.  Pathology revealed a poorly differentiated adenocarcinoma with signet ring features from the biopsies of the ulcer as well as the antrum and the fundus of the stomach.  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  She was placed on omeprazole.  Her test for H. pylori was negative.  She was referred to Dr. Hardin Negus for consideration of surgery but he felt this was not resectable because of the extensive involvement and I agree.  I would consider this extending to the duodenum and also metastatic to the omentum.  PET scan confirmed these findings and she wished to pursue systemic intravenous therapy.  She continues to eat and has adjusted her diet to softer foods and liquids and is drinking boost so she has maintained her weight.  She does have some Zofran ODT for nausea when needed.  She has had some anorexia, nausea, occasional vomiting, early satiety, dysphagia, and belching.  A CEA and CA 19-9 were normal. She has been started on FOLFOX chemotherapy along with immunotherapy.  I have reviewed her chart and materials related to her cancer extensively and collaborated history with the patient. Summary of oncologic history is as follows: Oncology History  Gastric cancer  09/10/2021 Initial Diagnosis   Gastric cancer (HCC)   09/10/2021 Cancer Staging   Staging form: Stomach, AJCC 8th Edition - Clinical stage from 09/10/2021: Stage IVB (cT4b, cN0, cM1) - Signed by Dellia Beckwith, MD on 09/10/2021 Histopathologic type: Adenocarcinoma, NOS Stage prefix: Initial diagnosis Total positive  nodes: 0 Histologic grade (G): G3 Histologic grading system: 3 grade system Sites of metastasis: Peritoneal surface Diagnostic confirmation: Positive histology PLUS positive immunophenotyping and/or positive genetic studies Specimen type: Endoscopy with Biopsy Staged by: Managing physician Carcinoembryonic antigen (CEA) (ng/mL): 2.8 Carbohydrate antigen 19-9 (CA 19-9) (U/mL): 4.9 HER2 status: Unknown Microsatellite instability (MSI): Unknown Tumor location in stomach: Other Clinical staging modalities: Biopsy, Endoscopy Stage used in treatment planning: Yes National guidelines used in treatment planning: Yes Type of national guideline used in treatment planning: NCCN   09/28/2021 - 10/14/2021 Chemotherapy   Patient is on Treatment Plan : GASTROESOPHAGEAL FOLFOX + Nivolumab q14d     09/28/2021 -  Chemotherapy   Patient is on Treatment Plan : GASTROESOPHAGEAL FOLFOX + Nivolumab q14d     12/09/2021 Genetic Testing   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  Report date is 12/09/2021.   The Multi-Cancer + RNA Panel offered by [facility] includes sequencing and/or deletion/duplication analysis of the following 84 genes:  AIP*, ALK, APC*, ATM*, AXIN2*, BAP1*, BARD1*, BLM*, BMPR1A*, BRCA1*, BRCA2*, BRIP1*, CASR, CDC73*, CDH1*, CDK4, CDKN1B*, CDKN1C*, CDKN2A,  CEBPA, CHEK2*, CTNNA1*, DICER1*, DIS3L2*, EGFR, EPCAM, FH*, FLCN*, GATA2*, GPC3, GREM1, HOXB13, HRAS, KIT, MAX*, MEN1*, MET, MITF, MLH1*, MSH2*, MSH3*, MSH6*, MUTYH*, NBN*, NF1*, NF2*, NTHL1*, PALB2*, PDGFRA, PHOX2B, PMS2*, POLD1*, POLE*, POT1*, PRKAR1A*, PTCH1*, PTEN*, RAD50*, RAD51C*, RAD51D*, RB1*, RECQL4, RET, RUNX1*, SDHA*, SDHAF2*, SDHB*, SDHC*, SDHD*, SMAD4*, SMARCA4*, SMARCB1*, SMARCE1*, STK11*, SUFU*, TERC, TERT, TMEM127*, Tp53*, TSC1*, TSC2*, VHL*, WRN*, and WT1.  RNA analysis is performed for * genes.     INTERVAL HISTORY:  Mackenzie Lewis is here today for a follow-up for her gastric cancer.  Patient informed me that she is well and has a cold, she complains of persistent neuropathy. She informed me of a worrisome wart on the dorsum of her left wrist which has enlarged. I will refer her to see a dermatologist for further evaluation. We had placed her on 10 mg twice daily of Prednisone for immune mediated renal dysfunction and we have steadily tapered the dose with good results. She is down to half a pill of Prednisone. I advised her on Sunday to switch to taking 1/2 every other day for a few more days.Her day 1 cycle 13 of FOLFOX is scheduled on 05/31/2022 and day 3 pump D/C, cycle 13 is scheduled on 06/01/2022. She will have her pump removed on 06/02/2022. We have permanently stopped the Oxaliplatin. We will now add back the immunotherapy with her chemo to see if she can tolerate the combination again. We will also give her IV fluids on both days. Her CBC and CMP is normal including a creatinine of 1.0 as of today. [name] back in 2 weeks with CBC and CMP. She denies signs of infection such as sore throat, sinus drainage, cough, or urinary symptoms.  She denies fevers or recurrent chills. She denies pain. She denies nausea, vomiting, chest pain, dyspnea or cough. Her appetite is good and her weight has increased 3 pounds over last 2 weeks .She is accompanied at today's visit by her daughter.    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  If that happens,  she is to call us so that we can schedule her for an ultrasound to look for synovitis.     Essential hypertension 09/09/2016   Gastric cancer 09/10/2021   Hyperlipidemia 09/01/2016   Hypertension    Hypothyroidism 09/01/2016   Osteoarthritis of both feet 01/20/2016   Osteoarthritis, hand 01/20/2016   Thyroid disease   Degenerative disc disease History of endometriosis  Past Surgical History:  Procedure Laterality Date   APPENDECTOMY     LAPAROSCOPIC APPENDECTOMY N/A 04/10/2016   Procedure: APPENDECTOMY LAPAROSCOPIC;  Surgeon: Abigail Miyamoto, MD;  Location: [facility] OR;  Service: General;  Laterality: N/A;  Bilateral tubal ligation Total hysterectomy and bilateral salpingo-oophorectomy in 1980  Family History  Problem Relation Age of Onset   Hypertension Mother    Prostate cancer Father        metastatic; d. 60   Brain cancer Sister 57   Breast cancer Sister 23  AAA (abdominal aortic aneurysm) Brother    Leukemia Cousin        x2 maternal female cousins; d. before 69   Breast cancer Daughter 90       DCIS  Her sister has had breast cancer as well as a tumor of her head Her daughter has had breast cancer last year  Social History:  reports that she has never smoked. She has never used smokeless tobacco. She reports that she does not currently use alcohol. She reports that she does not use drugs.The patient is accompanied by her son, daughter-in-law and daughter today.  She is single but lives with a significant other.  She has the 2 children.  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  She grew up in [place] and [place].  She is active and healthy, especially for her age.  Allergies:  Allergies  Allergen Reactions   Doxycycline Rash   Sulfa Antibiotics Rash and Other (See Comments)    Other reaction(s): Other (See Comments)  "Made me feel weird"    Current Medications: Current Outpatient Medications  Medication Sig Dispense Refill   aspirin 81 MG EC tablet  Take 81 mg by mouth daily. Swallow whole.     Calcium Carbonate (CALCIUM 600 PO) Take 1 tablet by mouth daily.     Cholecalciferol (VITAMIN D3) 5000 units CAPS Take 1 capsule by mouth daily.     EXFORGE HCT 5-160-12.5 MG TABS Take 1 tablet by mouth daily.     famotidine (PEPCID) 40 MG tablet Take 40 mg by mouth at bedtime.     KRILL OIL PO Take 1 capsule by mouth daily. Unknown strenght     Lactobacillus TABS Take 1 tablet by mouth 2 (two) times daily.     levothyroxine (SYNTHROID, LEVOTHROID) 75 MCG tablet Take 75 mcg by mouth daily before breakfast.      magic mouthwash SOLN SWISH AND SWALLOW BY MOUTH EVERY THREE TO FOUR HOURS     NON FORMULARY MMW: 3 parts Maalox 2 parts Benadryl 1 part viscious lidicaine  Disp. 6oz  Instructions: 5ml swish and swallow every 3-4 hours     omeprazole (PRILOSEC) 40 MG capsule Take 1 capsule (40 mg total) by mouth 2 (two) times daily. 60 capsule 5   ondansetron (ZOFRAN) 4 MG tablet Take 4 mg by mouth every 4 (four) hours as needed.     ondansetron (ZOFRAN-ODT) 4 MG disintegrating tablet Take 1 tablet (4 mg total) by mouth every 8 (eight) hours as needed for nausea or vomiting. 90 tablet 0   polyethylene glycol powder (GLYCOLAX/MIRALAX) 17 GM/SCOOP powder SMARTSIG:1 scoopful By Mouth Daily     potassium chloride SA (KLOR-CON M) 20 MEQ tablet Take 1 tablet (20 mEq total) by mouth daily. 30 tablet 5   pravastatin (PRAVACHOL) 20 MG tablet Take 1 tablet (20 mg total) by mouth every evening. 90 tablet 3   predniSONE (DELTASONE) 10 MG tablet Take 2 tablets (20 mg total) by mouth 2 (two) times daily with a meal. (Patient taking differently: Take 10 mg by mouth 2 (two) times daily with a meal. One 10mg  tablet twice a day) 100 tablet 1   Probiotic Product (PROBIOTIC DAILY PO) Take 1 tablet by mouth daily.     prochlorperazine (COMPAZINE) 10 MG tablet Take 1 tablet (10 mg total) by mouth every 6 (six) hours as needed for nausea or vomiting. 90 tablet 3    triamcinolone 0.1% oint-Eucerin equivalent cream 1:1 mixture Apply  topically 2 (two) times daily. 160 g 0   triamcinolone cream (KENALOG) 0.1 % Apply to affected skin twice daily (Patient taking differently: daily as needed. Apply to affected skin twice daily) 160 g 0   No current facility-administered medications for this visit.   REVIEW OF SYSTEMS:  Review of Systems  Constitutional: Negative.  Negative for appetite change, chills, diaphoresis, fatigue, fever and unexpected weight change.  HENT:   Positive for voice change. Negative for hearing loss, lump/mass, mouth sores, nosebleeds, sore throat, tinnitus and trouble swallowing.        She has a cold accompanied with watery eyes, sinus drainage, and a voice change.   Eyes:  Positive for eye problems (watery eyes). Negative for icterus.  Respiratory: Negative.  Negative for chest tightness, cough, hemoptysis, shortness of breath and wheezing.   Cardiovascular: Negative.  Negative for chest pain, leg swelling and palpitations.  Gastrointestinal: Negative.  Negative for abdominal distention, abdominal pain, blood in stool, constipation, diarrhea, nausea, rectal pain and vomiting.  Endocrine: Negative.  Negative for hot flashes.  Genitourinary: Negative.  Negative for bladder incontinence, difficulty urinating, dyspareunia, dysuria, frequency, hematuria, menstrual problem, nocturia, pelvic pain, vaginal bleeding and vaginal discharge.   Musculoskeletal: Negative.  Negative for arthralgias, back pain, flank pain, gait problem, myalgias, neck pain and neck stiffness.  Skin: Negative.  Negative for itching, rash and wound.       Occasional blotchy discoloration of her lower extremities, but much improved  Neurological:  Positive for numbness (in her fingers and toes). Negative for dizziness, extremity weakness, gait problem, headaches, light-headedness, seizures and speech difficulty.  Hematological: Negative.  Negative for adenopathy. Does not  bruise/bleed easily.  Psychiatric/Behavioral: Negative.  Negative for confusion, decreased concentration, depression, sleep disturbance and suicidal ideas. The patient is not nervous/anxious.    VITALS:  Blood pressure (!) 147/71, pulse (!) 104, temperature 98.7 F (37.1 C), temperature source Oral, resp. rate 18, height 5\' 4"  (1.626 m), weight 194 lb 6.4 oz (88.2 kg), SpO2 97 %.  Wt Readings from Last 3 Encounters:  06/02/22 197 lb 1.3 oz (89.4 kg)  05/31/22 192 lb 1.9 oz (87.1 kg)  05/27/22 194 lb 6.4 oz (88.2 kg)    Body mass index is 33.37 kg/m.  Performance status (ECOG): 1 - Symptomatic but completely ambulatory  PHYSICAL EXAM:  Physical Exam Vitals and nursing note reviewed. Exam conducted with a chaperone present.  Constitutional:      General: She is not in acute distress.    Appearance: Normal appearance. She is normal weight. She is not ill-appearing, toxic-appearing or diaphoretic.  HENT:     Head: Normocephalic and atraumatic.     Right Ear: Tympanic membrane, ear canal and external ear normal. There is no impacted cerumen.     Left Ear: Tympanic membrane, ear canal and external ear normal. There is no impacted cerumen.     Nose: Nose normal. No congestion or rhinorrhea.     Mouth/Throat:     Mouth: Mucous membranes are moist.     Pharynx: Oropharynx is clear. No oropharyngeal exudate or posterior oropharyngeal erythema.  Eyes:     General: No scleral icterus.       Right eye: No discharge.        Left eye: No discharge.     Extraocular Movements: Extraocular movements intact.     Conjunctiva/sclera: Conjunctivae normal.     Pupils: Pupils are equal, round, and reactive to light.  Neck:     Vascular: No  carotid bruit.  Cardiovascular:     Rate and Rhythm: Normal rate and regular rhythm.     Pulses: Normal pulses.     Heart sounds: Normal heart sounds. No murmur heard.    No friction rub. No gallop.  Pulmonary:     Effort: Pulmonary effort is normal. No  respiratory distress.     Breath sounds: Normal breath sounds. No stridor. No wheezing, rhonchi or rales.  Chest:     Chest wall: No tenderness.  Abdominal:     General: Bowel sounds are normal. There is no distension.     Palpations: Abdomen is soft. There is no hepatomegaly, splenomegaly or mass.     Tenderness: There is no abdominal tenderness. There is no right CVA tenderness, left CVA tenderness, guarding or rebound.     Hernia: No hernia is present.  Musculoskeletal:        General: No swelling, tenderness, deformity or signs of injury. Normal range of motion.     Cervical back: Normal range of motion and neck supple. No rigidity or tenderness.     Right lower leg: No edema.     Left lower leg: No edema.  Lymphadenopathy:     Cervical: No cervical adenopathy.     Right cervical: No superficial, deep or posterior cervical adenopathy.    Left cervical: No superficial, deep or posterior cervical adenopathy.     Upper Body:     Right upper body: No supraclavicular, axillary or pectoral adenopathy.     Left upper body: No supraclavicular, axillary or pectoral adenopathy.  Skin:    General: Skin is warm and dry.     Coloration: Skin is not jaundiced or pale.     Findings: No bruising, erythema, lesion or rash.     Comments: verrucous lesion of the dorsum of her left wrist.    Neurological:     General: No focal deficit present.     Mental Status: She is alert and oriented to person, place, and time. Mental status is at baseline.     Cranial Nerves: No cranial nerve deficit.     Sensory: No sensory deficit.     Motor: No weakness.     Coordination: Coordination normal.     Gait: Gait normal.     Deep Tendon Reflexes: Reflexes normal.  Psychiatric:        Mood and Affect: Mood normal.        Behavior: Behavior normal.        Thought Content: Thought content normal.        Judgment: Judgment normal.    LABS:      Latest Ref Rng & Units 05/27/2022   12:00 AM 04/29/2022    12:00 AM 04/19/2022   12:00 AM  CBC  WBC  11.5     19.1     16.5      Hemoglobin 12.0 - 16.0 12.5     12.5     13.0      Hematocrit 36 - 46 36     37     38      Platelets 150 - 400 K/uL 155     209     260         This result is from an external source.      Latest Ref Rng & Units 05/27/2022   12:00 AM 04/29/2022    2:33 PM 04/29/2022   12:00 AM  CMP  Glucose 70 - 99 mg/dL  155    BUN 4 - 21 20     41  42      Creatinine 0.5 - 1.1 1.0     1.67  1.5      Sodium 137 - 147 136     137  136      Potassium 3.5 - 5.1 mEq/L 4.2     3.7  3.7      Chloride 99 - 108 105     103  105      CO2 13 - 22 21     24  25       Calcium 8.7 - 10.7 9.8     9.2  9.2      Total Protein 6.5 - 8.1 g/dL  6.4    Total Bilirubin 0.3 - 1.2 mg/dL  0.7    Alkaline Phos 25 - 125 76     60  79      AST 13 - 35 29     23  67      ALT 7 - 35 U/L 28     24  27          This result is from an external source.   Lab Results  Component Value Date   CEA1 2.9 09/10/2021   /  CEA  Date Value Ref Range Status  09/10/2021 2.9 0.0 - 4.7 ng/mL Final    Comment:    (NOTE)                             Nonsmokers          <3.9                             Smokers             <5.6 [employer] Electrochemiluminescence Immunoassay (ECLIA) Values obtained with different assay methods or kits cannot be used interchangeably.  Results cannot be interpreted as absolute evidence of the presence or absence of malignant disease. Performed At: [facility] [address] [place], [place] [id number] Jolene Schimke MD WU:[id number]    Component Ref Range & Units 03/22/2022 6 mo ago  Glucose-Capillary 70 - 99 mg/dL 96 956 High  CM   Component Ref Range & Units 3 wk ago (04/29/22) 1 mo ago (04/15/22) 1 mo ago (04/07/22) 2 mo ago (03/24/22) 3 mo ago (02/16/22) 3 mo ago (01/28/22) 4 mo ago (01/14/22)  TSH 0.350 - 4.500 uIU/mL 0.410 0.892 CM 1.895 CM 1.835 CM 2.087 CM 1.773 CM 2.604 CM   Component Ref Range & Units  3 wk ago (04/29/22) 1 mo ago (04/15/22) 1 mo ago (04/07/22) 2 mo ago (03/24/22) 3 mo ago (02/16/22) 3 mo ago (01/28/22) 4 mo ago (01/14/22)  T4, Total 4.5 - 12.0 ug/dL 7.6 21.3 CM 08.6 CM 57.8 CM 12.5 High  CM 14.6 High  CM 13.2 High  CM     No results found for: "PSA1" No results found for: "ION629" No results found for: "CAN125"  No results found for: "TOTALPROTELP", "ALBUMINELP", "A1GS", "A2GS", "BETS", "BETA2SER", "GAMS", "MSPIKE", "SPEI" No results found for: "TIBC", "FERRITIN", "IRONPCTSAT" No results found for: "LDH"  STUDIES:  No results found.   EXAM: 03/22/2022 NUCLEAR MEDICINE PET SKULL BASE TO THIGH IMPRESSION: 1. Resolution of metabolic activity associated with the stomach. 2. Decrease in size of omental nodularity. No associated metabolic activity.  3. No evidence of new or progressive gastric carcinoma. 4. New peripheral consolidation in the RIGHT lower lobe with moderate metabolic activity. Favor focus of atelectasis versus less likely pneumonia. 5.  Aortic Atherosclerosis (ICD10-I70.0).  EXAM:12/16/21 CT CHEST, ABDOMEN, AND PELVIS WITH CONTRAST IMPRESSION: Response to therapy of gastric primary and adjacent omental nodularity. No new or progressive disease. Right larger than left lower lobe pulmonary nodules, felt to be similar to the 04/10/2016 remote CT and therefore benign. Progressive interstitial lung disease, right greater than left. This could be postinfectious/inflammatory or less likely represent nonspecific interstitial pneumonitis. Improvement in common duct dilation, likely incidental given chronicity.   EXAM:09/18/2021 NUCLEAR MEDICINE PET SKULL BASE TO THIGH NUCLEAR MEDICINE PET SKULL BASE TO THIGH IMPRESSION: 1. Evaluation of the gastric wall is limited by minimal distension, within this context, here is diffuse hypermetabolic activity within the stomach with similar diffuse nonspecific gastric wall thickening and no significant change in the  appearance of the possible anterior gastric body ulceration. 2. Mildly metabolic omental nodularity anterior and inferior to the stomach, likely reflects omental disease involvement. 3. No convincing evidence of hypermetabolic metastatic disease in the neck, chest, or pelvis. 4. Similar mild intrahepatic and moderate extrahepatic biliary ductal dilation with the common duct measuring 13 mm no suspicious hypermetabolic lesion identified within the duct and no discrete lesion identified on prior MRI dated July 20, 2021. 5.  Aortic Atherosclerosis (ICD10-I70.0).   I,Jasmine M Lassiter,acting as a scribe for Dellia Beckwith, MD.,have documented all relevant documentation on the behalf of Dellia Beckwith, MD,as directed by  Dellia Beckwith, MD while in the presence of Dellia Beckwith, MD.

## 2022-05-27 ENCOUNTER — Inpatient Hospital Stay: Payer: Medicare Other | Admitting: Oncology

## 2022-05-27 ENCOUNTER — Inpatient Hospital Stay: Payer: Medicare Other | Attending: Hematology and Oncology

## 2022-05-27 ENCOUNTER — Telehealth: Payer: Self-pay | Admitting: Oncology

## 2022-05-27 ENCOUNTER — Encounter: Payer: Self-pay | Admitting: Oncology

## 2022-05-27 DIAGNOSIS — C168 Malignant neoplasm of overlapping sites of stomach: Secondary | ICD-10-CM

## 2022-05-27 DIAGNOSIS — D649 Anemia, unspecified: Secondary | ICD-10-CM | POA: Diagnosis not present

## 2022-05-27 DIAGNOSIS — Z79899 Other long term (current) drug therapy: Secondary | ICD-10-CM | POA: Insufficient documentation

## 2022-05-27 DIAGNOSIS — C169 Malignant neoplasm of stomach, unspecified: Secondary | ICD-10-CM | POA: Diagnosis not present

## 2022-05-27 DIAGNOSIS — Z5111 Encounter for antineoplastic chemotherapy: Secondary | ICD-10-CM | POA: Diagnosis not present

## 2022-05-27 LAB — COMPREHENSIVE METABOLIC PANEL (ASHBORO CC SCANNED REPORT)

## 2022-05-27 LAB — CBC AND DIFFERENTIAL
HCT: 36 (ref 36–46)
Hemoglobin: 12.5 (ref 12.0–16.0)
Neutrophils Absolute: 8.63
Platelets: 155 10*3/uL (ref 150–400)
WBC: 11.5

## 2022-05-27 LAB — COMPREHENSIVE METABOLIC PANEL
Albumin: 4.2 (ref 3.5–5.0)
Calcium: 9.8 (ref 8.7–10.7)
eGFR: 53

## 2022-05-27 LAB — HEPATIC FUNCTION PANEL
ALT: 28 U/L (ref 7–35)
AST: 29 (ref 13–35)
Alkaline Phosphatase: 76 (ref 25–125)
Bilirubin, Total: 0.5

## 2022-05-27 LAB — BASIC METABOLIC PANEL
BUN: 20 (ref 4–21)
CO2: 21 (ref 13–22)
Chloride: 105 (ref 99–108)
Creatinine: 1 (ref 0.5–1.1)
Glucose: 130
Potassium: 4.2 mEq/L (ref 3.5–5.1)
Sodium: 136 — AB (ref 137–147)

## 2022-05-27 LAB — TSH: TSH: 0.99 u[IU]/mL (ref 0.350–4.500)

## 2022-05-27 LAB — CBC: RBC: 4.07 (ref 3.87–5.11)

## 2022-05-27 LAB — CBC W DIFFERENTIAL (~~LOC~~ CC SCANNED REPORT)

## 2022-05-27 NOTE — Telephone Encounter (Signed)
05/27/22 Next appt scheduled and confirmed with patient 

## 2022-05-28 MED FILL — Fluorouracil IV Soln 5 GM/100ML (50 MG/ML): INTRAVENOUS | Qty: 77 | Status: AC

## 2022-05-28 MED FILL — Dexamethasone Sodium Phosphate Inj 100 MG/10ML: INTRAMUSCULAR | Qty: 1 | Status: AC

## 2022-05-28 MED FILL — Fluorouracil IV Soln 2.5 GM/50ML (50 MG/ML): INTRAVENOUS | Qty: 13 | Status: AC

## 2022-05-28 MED FILL — Nivolumab IV Soln 240 MG/24ML: INTRAVENOUS | Qty: 24 | Status: AC

## 2022-05-29 LAB — T4: T4, Total: 9.6 ug/dL (ref 4.5–12.0)

## 2022-05-30 ENCOUNTER — Other Ambulatory Visit: Payer: Self-pay

## 2022-05-31 ENCOUNTER — Inpatient Hospital Stay: Payer: Medicare Other

## 2022-05-31 ENCOUNTER — Encounter: Payer: Self-pay | Admitting: Oncology

## 2022-05-31 VITALS — BP 150/62 | HR 90 | Temp 97.8°F | Resp 18 | Ht 64.0 in | Wt 192.1 lb

## 2022-05-31 DIAGNOSIS — Z79899 Other long term (current) drug therapy: Secondary | ICD-10-CM | POA: Diagnosis not present

## 2022-05-31 DIAGNOSIS — Z5111 Encounter for antineoplastic chemotherapy: Secondary | ICD-10-CM | POA: Diagnosis not present

## 2022-05-31 DIAGNOSIS — C168 Malignant neoplasm of overlapping sites of stomach: Secondary | ICD-10-CM

## 2022-05-31 DIAGNOSIS — C169 Malignant neoplasm of stomach, unspecified: Secondary | ICD-10-CM | POA: Diagnosis not present

## 2022-05-31 MED ORDER — SODIUM CHLORIDE 0.9 % IV SOLN
10.0000 mg | Freq: Once | INTRAVENOUS | Status: AC
  Administered 2022-05-31: 10 mg via INTRAVENOUS
  Filled 2022-05-31: qty 10

## 2022-05-31 MED ORDER — SODIUM CHLORIDE 0.9 % IV SOLN: Freq: Once | INTRAVENOUS | Status: AC

## 2022-05-31 MED ORDER — SODIUM CHLORIDE 0.9 % IV SOLN
240.0000 mg | Freq: Once | INTRAVENOUS | Status: AC
  Administered 2022-05-31: 240 mg via INTRAVENOUS
  Filled 2022-05-31: qty 24

## 2022-05-31 MED ORDER — PALONOSETRON HCL INJECTION 0.25 MG/5ML
0.2500 mg | Freq: Once | INTRAVENOUS | Status: AC
  Administered 2022-05-31: 0.25 mg via INTRAVENOUS
  Filled 2022-05-31: qty 5

## 2022-05-31 MED ORDER — FLUOROURACIL CHEMO INJECTION 2.5 GM/50ML
320.0000 mg/m2 | Freq: Once | INTRAVENOUS | Status: AC
  Administered 2022-05-31: 650 mg via INTRAVENOUS
  Filled 2022-05-31: qty 13

## 2022-05-31 MED ORDER — SODIUM CHLORIDE 0.9 % IV SOLN
1955.0000 mg/m2 | INTRAVENOUS | Status: DC
  Administered 2022-05-31: 3850 mg via INTRAVENOUS
  Filled 2022-05-31: qty 77

## 2022-05-31 MED ORDER — DEXTROSE 5 % IV SOLN: Freq: Once | INTRAVENOUS | Status: AC

## 2022-05-31 MED ORDER — LEUCOVORIN CALCIUM INJECTION 350 MG
320.0000 mg/m2 | Freq: Once | INTRAVENOUS | Status: AC
  Administered 2022-05-31: 630 mg via INTRAVENOUS
  Filled 2022-05-31: qty 31.5

## 2022-05-31 NOTE — Patient Instructions (Signed)
Dehydration, Adult Dehydration is a condition in which there is not enough water or other fluids in the body. This happens when a person loses more fluids than they take in. Important organs cannot work right without the right amount of fluids. Any loss of fluids from the body can cause dehydration. Dehydration can be mild, worse, or very bad. It should be treated right away to keep it from getting very bad. What are the causes? Conditions that cause loss of water in the body. They include: Watery poop (diarrhea). Vomiting. Sweating a lot. Fever. Infection. Peeing (urinating) a lot. Not drinking enough fluids. Certain medicines, such as medicines that take extra fluid out of the body (diuretics). Lack of safe drinking water. Not being able to get enough water and food. What increases the risk? Having a long-term (chronic) illness that has not been treated the right way, such as: Diabetes. Heart disease. Kidney disease. Being 65 years of age or older. Having a disability. Living in a place that is high above the ground or sea (high in altitude). The thinner, drier air causes more fluid loss. Doing exercises that put stress on your body for a long time. Being active when in hot places. What are the signs or symptoms? Symptoms of dehydration depend on how bad it is. Mild or worse dehydration Thirst. Dry lips or dry mouth. Feeling dizzy or light-headed. Muscle cramps. Passing little pee or dark pee. Pee may be the color of tea. Headache. Very bad dehydration Changes in skin. Skin may: Be cold to the touch (clammy). Be blotchy or pale. Not go back to normal right after you pinch it and let it go. Little or no tears, pee, or sweat. Fast breathing. Low blood pressure. Weak pulse. Pulse that is more than 100 beats a minute when you are sitting still. Other changes, such as: Feeling very thirsty. Eyes that look hollow (sunken). Cold hands and feet. Being confused. Being very  tired (lethargic) or having trouble waking from sleep. Losing weight. Loss of consciousness. How is this treated? Treatment for this condition depends on how bad your dehydration is. Treatment should start right away. Do not wait until your condition gets very bad. Very bad dehydration is an emergency. You will need to go to a hospital. Mild or worse dehydration can be treated at home. You may be asked to: Drink more fluids. Drink an oral rehydration solution (ORS). This drink gives you the right amount of fluids, salts, and minerals (electrolytes). Very bad dehydration can be treated: With fluids through an IV tube. By correcting low levels of electrolytes in the body. By treating the problem that caused your dehydration. Follow these instructions at home: Oral rehydration solution If told by your doctor, drink an ORS: Make an ORS. Use instructions on the package. Start by drinking small amounts, about  cup (120 mL) every 5-10 minutes. Slowly drink more until you have had the amount that your doctor said to have.  Eating and drinking  Drink enough clear fluid to keep your pee pale yellow. If you were told to drink an ORS, finish the ORS first. Then, start slowly drinking other clear fluids. Drink fluids such as: Water. Do not drink only water. Doing that can make the salt (sodium) level in your body get too low. Water from ice chips you suck on. Fruit juice that you have added water to (diluted). Low-calorie sports drinks. Eat foods that have the right amounts of salts and minerals, such as bananas, oranges, potatoes,   tomatoes, or spinach. Do not drink alcohol. Avoid drinks that have caffeine or sugar. These include:: High-calorie sports drinks. Fruit juice that you did not add water to. Soda. Coffee or energy drinks. Avoid foods that are greasy or have a lot of fat or sugar. General instructions Take over-the-counter and prescription medicines only as told by your doctor. Do  not take sodium tablets. Doing that can make the salt level in your body get too high. Return to your normal activities as told by your doctor. Ask your doctor what activities are safe for you. Keep all follow-up visits. Your doctor may check and change your treatment. Contact a doctor if: You have pain in your belly (abdomen) and the pain: Gets worse. Stays in one place. You have a rash. You have a stiff neck. You get angry or annoyed more easily than normal. You are more tired or have a harder time waking than normal. You feel weak or dizzy. You feel very thirsty. Get help right away if: You have any symptoms of very bad dehydration. You vomit every time you eat or drink. Your vomiting gets worse, does not go away, or you vomit blood or green stuff. You are getting treatment, but symptoms are getting worse. You have a fever. You have a very bad headache. You have: Diarrhea that gets worse or does not go away. Blood in your poop (stool). This may cause poop to look black and tarry. No pee in 6-8 hours. Only a small amount of pee in 6-8 hours, and the pee is very dark. You have trouble breathing. These symptoms may be an emergency. Get help right away. Call 911. Do not wait to see if the symptoms will go away. Do not drive yourself to the hospital. This information is not intended to replace advice given to you by your health care provider. Make sure you discuss any questions you have with your health care provider. Document Revised: 08/31/2021 Document Reviewed: 08/31/2021 Elsevier Patient Education  2023 [employer].Dehydration, Adult Dehydration is a condition in which there is not enough water or other fluids in the body. This happens when a person loses more fluids than they take in. Important organs cannot work right without the right amount of fluids. Any loss of fluids from the body can cause dehydration. Dehydration can be mild, worse, or very bad. It should be treated  right away to keep it from getting very bad. What are the causes? Conditions that cause loss of water in the body. They include: Watery poop (diarrhea). Vomiting. Sweating a lot. Fever. Infection. Peeing (urinating) a lot. Not drinking enough fluids. Certain medicines, such as medicines that take extra fluid out of the body (diuretics). Lack of safe drinking water. Not being able to get enough water and food. What increases the risk? Having a long-term (chronic) illness that has not been treated the right way, such as: Diabetes. Heart disease. Kidney disease. Being 25 years of age or older. Having a disability. Living in a place that is high above the ground or sea (high in altitude). The thinner, drier air causes more fluid loss. Doing exercises that put stress on your body for a long time. Being active when in hot places. What are the signs or symptoms? Symptoms of dehydration depend on how bad it is. Mild or worse dehydration Thirst. Dry lips or dry mouth. Feeling dizzy or light-headed. Muscle cramps. Passing little pee or dark pee. Pee may be the color of tea. Headache. Very bad dehydration  Changes in skin. Skin may: Be cold to the touch (clammy). Be blotchy or pale. Not go back to normal right after you pinch it and let it go. Little or no tears, pee, or sweat. Fast breathing. Low blood pressure. Weak pulse. Pulse that is more than 100 beats a minute when you are sitting still. Other changes, such as: Feeling very thirsty. Eyes that look hollow (sunken). Cold hands and feet. Being confused. Being very tired (lethargic) or having trouble waking from sleep. Losing weight. Loss of consciousness. How is this treated? Treatment for this condition depends on how bad your dehydration is. Treatment should start right away. Do not wait until your condition gets very bad. Very bad dehydration is an emergency. You will need to go to a hospital. Mild or worse dehydration  can be treated at home. You may be asked to: Drink more fluids. Drink an oral rehydration solution (ORS). This drink gives you the right amount of fluids, salts, and minerals (electrolytes). Very bad dehydration can be treated: With fluids through an IV tube. By correcting low levels of electrolytes in the body. By treating the problem that caused your dehydration. Follow these instructions at home: Oral rehydration solution If told by your doctor, drink an ORS: Make an ORS. Use instructions on the package. Start by drinking small amounts, about  cup (120 mL) every 5-10 minutes. Slowly drink more until you have had the amount that your doctor said to have.  Eating and drinking  Drink enough clear fluid to keep your pee pale yellow. If you were told to drink an ORS, finish the ORS first. Then, start slowly drinking other clear fluids. Drink fluids such as: Water. Do not drink only water. Doing that can make the salt (sodium) level in your body get too low. Water from ice chips you suck on. Fruit juice that you have added water to (diluted). Low-calorie sports drinks. Eat foods that have the right amounts of salts and minerals, such as bananas, oranges, potatoes, tomatoes, or spinach. Do not drink alcohol. Avoid drinks that have caffeine or sugar. These include:: High-calorie sports drinks. Fruit juice that you did not add water to. Soda. Coffee or energy drinks. Avoid foods that are greasy or have a lot of fat or sugar. General instructions Take over-the-counter and prescription medicines only as told by your doctor. Do not take sodium tablets. Doing that can make the salt level in your body get too high. Return to your normal activities as told by your doctor. Ask your doctor what activities are safe for you. Keep all follow-up visits. Your doctor may check and change your treatment. Contact a doctor if: You have pain in your belly (abdomen) and the pain: Gets worse. Stays in  one place. You have a rash. You have a stiff neck. You get angry or annoyed more easily than normal. You are more tired or have a harder time waking than normal. You feel weak or dizzy. You feel very thirsty. Get help right away if: You have any symptoms of very bad dehydration. You vomit every time you eat or drink. Your vomiting gets worse, does not go away, or you vomit blood or green stuff. You are getting treatment, but symptoms are getting worse. You have a fever. You have a very bad headache. You have: Diarrhea that gets worse or does not go away. Blood in your poop (stool). This may cause poop to look black and tarry. No pee in 6-8 hours. Only a small  amount of pee in 6-8 hours, and the pee is very dark. You have trouble breathing. These symptoms may be an emergency. Get help right away. Call 911. Do not wait to see if the symptoms will go away. Do not drive yourself to the hospital. This information is not intended to replace advice given to you by your health care provider. Make sure you discuss any questions you have with your health care provider. Document Revised: 08/31/2021 Document Reviewed: 08/31/2021 Elsevier Patient Education  2023 [employer]   The chemotherapy medication bag should finish at 46 hours, 96 hours, or 7 days. For example, if your pump is scheduled for 46 hours and it was put on at 4:00 p.m., it should finish at 2:00 p.m. the day it is scheduled to come off regardless of your appointment time.     Estimated time to finish at 0930.   If the display on your pump reads "Low Volume" and it is beeping, take the batteries out of the pump and come to the cancer center for it to be taken off.   If the pump alarms go off prior to the pump reading "Low Volume" then call [phone] and someone can assist you.  If the plunger comes out and the chemotherapy medication is leaking out, please use your home chemo spill kit to clean up the spill. Do NOT use  paper towels or other household products.  If you have problems or questions regarding your pump, please call either [phone] (24 hours a day) or the cancer center Monday-Friday 8:00 a.m.- 4:30 p.m. at the clinic number and we will assist you. If you are unable to get assistance, then go to the nearest Emergency Department and ask the staff to contact the IV team for assistance.

## 2022-06-01 ENCOUNTER — Other Ambulatory Visit: Payer: Self-pay

## 2022-06-01 ENCOUNTER — Telehealth: Payer: Self-pay

## 2022-06-01 NOTE — Telephone Encounter (Signed)
Referral faxed to [facility] 

## 2022-06-01 NOTE — Telephone Encounter (Signed)
-----   Message from Dellia Beckwith, MD sent at 05/27/2022  6:35 PM EDT ----- Regarding: refer Ref Derm for large verrucous lesion dorsum of left wrist

## 2022-06-02 ENCOUNTER — Inpatient Hospital Stay: Payer: Medicare Other

## 2022-06-02 VITALS — BP 152/68 | HR 95 | Temp 97.9°F | Resp 18 | Ht 64.0 in | Wt 197.1 lb

## 2022-06-02 DIAGNOSIS — C169 Malignant neoplasm of stomach, unspecified: Secondary | ICD-10-CM | POA: Diagnosis not present

## 2022-06-02 DIAGNOSIS — C168 Malignant neoplasm of overlapping sites of stomach: Secondary | ICD-10-CM

## 2022-06-02 DIAGNOSIS — Z79899 Other long term (current) drug therapy: Secondary | ICD-10-CM | POA: Diagnosis not present

## 2022-06-02 DIAGNOSIS — Z5111 Encounter for antineoplastic chemotherapy: Secondary | ICD-10-CM | POA: Diagnosis not present

## 2022-06-02 MED ORDER — SODIUM CHLORIDE 0.9% FLUSH
10.0000 mL | INTRAVENOUS | Status: DC | PRN
  Administered 2022-06-02: 10 mL

## 2022-06-02 MED ORDER — HEPARIN SOD (PORK) LOCK FLUSH 100 UNIT/ML IV SOLN
500.0000 [IU] | Freq: Once | INTRAVENOUS | Status: AC | PRN
  Administered 2022-06-02: 500 [IU]

## 2022-06-02 MED ORDER — SODIUM CHLORIDE 0.9 % IV SOLN: Freq: Once | INTRAVENOUS | Status: AC

## 2022-06-02 MED ORDER — PROCHLORPERAZINE MALEATE 10 MG PO TABS
10.0000 mg | ORAL_TABLET | Freq: Once | ORAL | Status: AC
  Administered 2022-06-02: 10 mg via ORAL
  Filled 2022-06-02: qty 1

## 2022-06-02 NOTE — Patient Instructions (Signed)
Dehydration, Adult Dehydration is a condition in which there is not enough water or other fluids in the body. This happens when a person loses more fluids than they take in. Important organs cannot work right without the right amount of fluids. Any loss of fluids from the body can cause dehydration. Dehydration can be mild, worse, or very bad. It should be treated right away to keep it from getting very bad. What are the causes? Conditions that cause loss of water in the body. They include: Watery poop (diarrhea). Vomiting. Sweating a lot. Fever. Infection. Peeing (urinating) a lot. Not drinking enough fluids. Certain medicines, such as medicines that take extra fluid out of the body (diuretics). Lack of safe drinking water. Not being able to get enough water and food. What increases the risk? Having a long-term (chronic) illness that has not been treated the right way, such as: Diabetes. Heart disease. Kidney disease. Being 65 years of age or older. Having a disability. Living in a place that is high above the ground or sea (high in altitude). The thinner, drier air causes more fluid loss. Doing exercises that put stress on your body for a long time. Being active when in hot places. What are the signs or symptoms? Symptoms of dehydration depend on how bad it is. Mild or worse dehydration Thirst. Dry lips or dry mouth. Feeling dizzy or light-headed. Muscle cramps. Passing little pee or dark pee. Pee may be the color of tea. Headache. Very bad dehydration Changes in skin. Skin may: Be cold to the touch (clammy). Be blotchy or pale. Not go back to normal right after you pinch it and let it go. Little or no tears, pee, or sweat. Fast breathing. Low blood pressure. Weak pulse. Pulse that is more than 100 beats a minute when you are sitting still. Other changes, such as: Feeling very thirsty. Eyes that look hollow (sunken). Cold hands and feet. Being confused. Being very  tired (lethargic) or having trouble waking from sleep. Losing weight. Loss of consciousness. How is this treated? Treatment for this condition depends on how bad your dehydration is. Treatment should start right away. Do not wait until your condition gets very bad. Very bad dehydration is an emergency. You will need to go to a hospital. Mild or worse dehydration can be treated at home. You may be asked to: Drink more fluids. Drink an oral rehydration solution (ORS). This drink gives you the right amount of fluids, salts, and minerals (electrolytes). Very bad dehydration can be treated: With fluids through an IV tube. By correcting low levels of electrolytes in the body. By treating the problem that caused your dehydration. Follow these instructions at home: Oral rehydration solution If told by your doctor, drink an ORS: Make an ORS. Use instructions on the package. Start by drinking small amounts, about  cup (120 mL) every 5-10 minutes. Slowly drink more until you have had the amount that your doctor said to have.  Eating and drinking  Drink enough clear fluid to keep your pee pale yellow. If you were told to drink an ORS, finish the ORS first. Then, start slowly drinking other clear fluids. Drink fluids such as: Water. Do not drink only water. Doing that can make the salt (sodium) level in your body get too low. Water from ice chips you suck on. Fruit juice that you have added water to (diluted). Low-calorie sports drinks. Eat foods that have the right amounts of salts and minerals, such as bananas, oranges, potatoes,   tomatoes, or spinach. Do not drink alcohol. Avoid drinks that have caffeine or sugar. These include:: High-calorie sports drinks. Fruit juice that you did not add water to. Soda. Coffee or energy drinks. Avoid foods that are greasy or have a lot of fat or sugar. General instructions Take over-the-counter and prescription medicines only as told by your doctor. Do  not take sodium tablets. Doing that can make the salt level in your body get too high. Return to your normal activities as told by your doctor. Ask your doctor what activities are safe for you. Keep all follow-up visits. Your doctor may check and change your treatment. Contact a doctor if: You have pain in your belly (abdomen) and the pain: Gets worse. Stays in one place. You have a rash. You have a stiff neck. You get angry or annoyed more easily than normal. You are more tired or have a harder time waking than normal. You feel weak or dizzy. You feel very thirsty. Get help right away if: You have any symptoms of very bad dehydration. You vomit every time you eat or drink. Your vomiting gets worse, does not go away, or you vomit blood or green stuff. You are getting treatment, but symptoms are getting worse. You have a fever. You have a very bad headache. You have: Diarrhea that gets worse or does not go away. Blood in your poop (stool). This may cause poop to look black and tarry. No pee in 6-8 hours. Only a small amount of pee in 6-8 hours, and the pee is very dark. You have trouble breathing. These symptoms may be an emergency. Get help right away. Call 911. Do not wait to see if the symptoms will go away. Do not drive yourself to the hospital. This information is not intended to replace advice given to you by your health care provider. Make sure you discuss any questions you have with your health care provider. Document Revised: 08/31/2021 Document Reviewed: 08/31/2021 Elsevier Patient Education  2023 [employer] Inc[employer] Fluorouracil Injection What is this medication? FLUOROURACIL (flure oh YOOR a sil) treats some types of cancer. It works by slowing down the growth of cancer cells. This medicine may be used for other purposes; ask your health care provider or pharmacist if you have questions. COMMON BRAND NAME(S): Adrucil What should I tell my care team before I take this  medication? They need to know if you have any of these conditions: Blood disorders Dihydropyrimidine dehydrogenase (DPD) deficiency Infection, such as chickenpox, cold sores, herpes Kidney disease Liver disease Poor nutrition Recent or ongoing radiation therapy An unusual or allergic reaction to fluorouracil, other medications, foods, dyes, or preservatives If you or your partner are pregnant or trying to get pregnant Breast-feeding How should I use this medication? This medication is injected into a vein. It is administered by your care team in a hospital or clinic setting. Talk to your care team about the use of this medication in children. Special care may be needed. Overdosage: If you think you have taken too much of this medicine contact a poison control center or emergency room at once. NOTE: This medicine is only for you. Do not share this medicine with others. What if I miss a dose? Keep appointments for follow-up doses. It is important not to miss your dose. Call your care team if you are unable to keep an appointment. What may interact with this medication? Do not take this medication with any of the following: Live virus vaccines This medication  may also interact with the following: Medications that treat or prevent blood clots, such as warfarin, enoxaparin, dalteparin This list may not describe all possible interactions. Give your health care provider a list of all the medicines, herbs, non-prescription drugs, or dietary supplements you use. Also tell them if you smoke, drink alcohol, or use illegal drugs. Some items may interact with your medicine. What should I watch for while using this medication? Your condition will be monitored carefully while you are receiving this medication. This medication may make you feel generally unwell. This is not uncommon as chemotherapy can affect healthy cells as well as cancer cells. Report any side effects. Continue your course of treatment  even though you feel ill unless your care team tells you to stop. In some cases, you may be given additional medications to help with side effects. Follow all directions for their use. This medication may increase your risk of getting an infection. Call your care team for advice if you get a fever, chills, sore throat, or other symptoms of a cold or flu. Do not treat yourself. Try to avoid being around people who are sick. This medication may increase your risk to bruise or bleed. Call your care team if you notice any unusual bleeding. Be careful brushing or flossing your teeth or using a toothpick because you may get an infection or bleed more easily. If you have any dental work done, tell your dentist you are receiving this medication. Avoid taking medications that contain aspirin, acetaminophen, ibuprofen, naproxen, or ketoprofen unless instructed by your care team. These medications may hide a fever. Do not treat diarrhea with over the counter products. Contact your care team if you have diarrhea that lasts more than 2 days or if it is severe and watery. This medication can make you more sensitive to the sun. Keep out of the sun. If you cannot avoid being in the sun, wear protective clothing and sunscreen. Do not use sun lamps, tanning beds, or tanning booths. Talk to your care team if you or your partner wish to become pregnant or think you might be pregnant. This medication can cause serious birth defects if taken during pregnancy and for 3 months after the last dose. A reliable form of contraception is recommended while taking this medication and for 3 months after the last dose. Talk to your care team about effective forms of contraception. Do not father a child while taking this medication and for 3 months after the last dose. Use a condom while having sex during this time period. Do not breastfeed while taking this medication. This medication may cause infertility. Talk to your care team if you  are concerned about your fertility. What side effects may I notice from receiving this medication? Side effects that you should report to your care team as soon as possible: Allergic reactions--skin rash, itching, hives, swelling of the face, lips, tongue, or throat Heart attack--pain or tightness in the chest, shoulders, arms, or jaw, nausea, shortness of breath, cold or clammy skin, feeling faint or lightheaded Heart failure--shortness of breath, swelling of the ankles, feet, or hands, sudden weight gain, unusual weakness or fatigue Heart rhythm changes--fast or irregular heartbeat, dizziness, feeling faint or lightheaded, chest pain, trouble breathing High ammonia level--unusual weakness or fatigue, confusion, loss of appetite, nausea, vomiting, seizures Infection--fever, chills, cough, sore throat, wounds that don't heal, pain or trouble when passing urine, general feeling of discomfort or being unwell Low red blood cell level--unusual weakness or fatigue, dizziness,  headache, trouble breathing Pain, tingling, or numbness in the hands or feet, muscle weakness, change in vision, confusion or trouble speaking, loss of balance or coordination, trouble walking, seizures Redness, swelling, and blistering of the skin over hands and feet Severe or prolonged diarrhea Unusual bruising or bleeding Side effects that usually do not require medical attention (report to your care team if they continue or are bothersome): Dry skin Headache Increased tears Nausea Pain, redness, or swelling with sores inside the mouth or throat Sensitivity to light Vomiting This list may not describe all possible side effects. Call your doctor for medical advice about side effects. You may report side effects to FDA at 1-800-FDA-1088. Where should I keep my medication? This medication is given in a hospital or clinic. It will not be stored at home. NOTE: This sheet is a summary. It may not cover all possible information.  If you have questions about this medicine, talk to your doctor, pharmacist, or health care provider.  2023 Elsevier/Gold Standard (2021-06-02 00:00:00)

## 2022-06-08 DIAGNOSIS — I10 Essential (primary) hypertension: Secondary | ICD-10-CM | POA: Diagnosis not present

## 2022-06-08 DIAGNOSIS — E038 Other specified hypothyroidism: Secondary | ICD-10-CM | POA: Diagnosis not present

## 2022-06-08 DIAGNOSIS — Z0000 Encounter for general adult medical examination without abnormal findings: Secondary | ICD-10-CM | POA: Diagnosis not present

## 2022-06-08 DIAGNOSIS — E785 Hyperlipidemia, unspecified: Secondary | ICD-10-CM | POA: Diagnosis not present

## 2022-06-08 DIAGNOSIS — Z1331 Encounter for screening for depression: Secondary | ICD-10-CM | POA: Diagnosis not present

## 2022-06-08 DIAGNOSIS — G622 Polyneuropathy due to other toxic agents: Secondary | ICD-10-CM | POA: Diagnosis not present

## 2022-06-08 DIAGNOSIS — Z6832 Body mass index (BMI) 32.0-32.9, adult: Secondary | ICD-10-CM | POA: Diagnosis not present

## 2022-06-08 DIAGNOSIS — E063 Autoimmune thyroiditis: Secondary | ICD-10-CM | POA: Diagnosis not present

## 2022-06-09 ENCOUNTER — Encounter: Payer: Self-pay | Admitting: Oncology

## 2022-06-09 NOTE — Progress Notes (Signed)
[name] ([place])  [address] [place],  [place]  [address] [phone]  Clinic Day: 06/10/22  Referring physician: Philemon Kingdom, MD  ASSESSMENT & PLAN:  Gastric adenocarcinoma This is a poorly differentiated adenocarcinoma with signet ring features and ulceration. Stain for HER2 was negative at 0. I would consider it as metastatic to the omentum and so that would stage this as a T4 N0 M1, stage IVB.  She has been on treatment with chemotherapy with FOLFOX and nivolumab for 10 cycles with very good response, but then had increasing dermatologic toxicities and neuropathy. Her PET scan from 03/22/2022 shows a new area of hypermetabolic activity in the right lower lobe of peripheral consolidation, which is more likely atelectasis or resolving pneumonia measuring 3cm. The thickening of her stomach has decreased with decreased metabolic activity and there is shrinkage of the omental metastasis from 22mm to 13mm. The last time she received chemotherapy alone she had severe rash and fever and was admitted to the hospital, but she was also found to have pneumonia and a UTI at that time. We are not sure which medication caused her rashes. Dr. Jennye Boroughs did a EGD on March, 5th with findings of persistent poorly differentiated adenocarcinoma and a 3cm mass of the distal body of the stomach. Since her current treatment is palliating her disease, we will continue.    Immune Mediated Nephritis Her creatinine is improved at 1.6 today and she will receive 1 liter of normal saline tomorrow and was encouraged to drink more fluids. Renal ultrasound was normal. We felt this was caused by her immunotherapy and placed her on Prednisone. I had ordered 10mg  tablets to take 20mg  twice daily but the patient misunderstood and has only been taking 10mg  twice daily. We have steadily tapered the Prednisone dose with good response and she is down to 5mg  daily with a creatinine of 1.0. She has  tapered completely off the Prednisone as of mid April, 2024.    Plan We have dropped the Oxaliplatin chemotherapy. I informed her that she can take a oral B-complex supplement to help control her neuropathy and I would even take a extra vitamin B-6. Her WBC is 7.7, hemoglobin is 11.6, and platelet count 231,000. Her potassium has dropped from 4.2 to 3.4, I advised her to increase potassium in her diet and avoid Lasix unless absolutely necessary. Her creatinine also went from 1.0 to 1.40, she informed me that she did have fluids previously but not this time. I will schedule her for 1L of normal saline with IV potassium tomorrow @10  a.m. The rest of her CMP is normal. She will receive 1L normal saline next Monday and Wednesday along with her chemotherapy. Her day 1 cycle 14 of FOLFOX is scheduled on 06/14/2022 and day 3 pump D/C, cycle 14 is scheduled on 06/15/2022, If she doesn't feel well before the end of next week I advised her to call to schedule another liter of normal saline. [name] back in 2 weeks with CBC and CMP.  I reviewed all of this with her and her daughter and answered their questions. She understands and agrees with this plan.   I provided 30 minutes of face-to-face time during this this encounter and > 50% was spent counseling as documented under my assessment and plan.   Dellia Beckwith, MD [facility] [address] [place] [address] Dept: [phone] Dept Fax: [phone]  CHIEF COMPLAINT:  CC: Gastric cancer  Current Treatment: Chemotherapy/immunotherapy  HISTORY OF PRESENT ILLNESS:  Mackenzie Lewis is a 87 y.o. female with a history of gastric cancer who is referred in consultation with Dr. Shara Blazing for assessment and management.  She had noticed that she was having regurgitation when eating and had lost over 30 pounds.  An ultrasound was done, revealing hepatic steatosis, and led  to an MRI scan on June 5 which revealed gastric wall thickening with confluent nodularity of the omentum anterior to the stomach measuring 3.5 cm consistent with metastatic tumor.  She also had low-grade edema and wall thickening extending into the duodenum from the stomach.  She was referred to Dr. Shara Blazing and he did an EGD on July 7.  This revealed a large ulceration measuring 1.2 cm along the greater curvature.  She also had diffusely edematous and erythematous wall with erosions of the antrum and stiff and friable mucosa with oozing of blood.  These findings extended to the gastric fundus as well.  Pathology revealed a poorly differentiated adenocarcinoma with signet ring features from the biopsies of the ulcer as well as the antrum and the fundus of the stomach.  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  She was placed on omeprazole.  Her test for H. pylori was negative.  She was referred to Dr. Hardin Negus for consideration of surgery but he felt this was not resectable because of the extensive involvement and I agree.  I would consider this extending to the duodenum and also metastatic to the omentum.  PET scan confirmed these findings and she wished to pursue systemic intravenous therapy.  She continues to eat and has adjusted her diet to softer foods and liquids and is drinking boost so she has maintained her weight.  She does have some Zofran ODT for nausea when needed.  She has had some anorexia, nausea, occasional vomiting, early satiety, dysphagia, and belching.  A CEA and CA 19-9 were normal. She has been started on FOLFOX chemotherapy along with immunotherapy.  I have reviewed her chart and materials related to her cancer extensively and collaborated history with the patient. Summary of oncologic history is as follows: Oncology History  Gastric cancer (HCC)  09/10/2021 Initial Diagnosis   Gastric cancer (HCC)   09/10/2021 Cancer  Staging   Staging form: Stomach, AJCC 8th Edition - Clinical stage from 09/10/2021: Stage IVB (cT4b, cN0, cM1) - Signed by Dellia Beckwith, MD on 09/10/2021 Histopathologic type: Adenocarcinoma, NOS Stage prefix: Initial diagnosis Total positive nodes: 0 Histologic grade (G): G3 Histologic grading system: 3 grade system Sites of metastasis: Peritoneal surface Diagnostic confirmation: Positive histology PLUS positive immunophenotyping and/or positive genetic studies Specimen type: Endoscopy with Biopsy Staged by: Managing physician Carcinoembryonic antigen (CEA) (ng/mL): 2.8 Carbohydrate antigen 19-9 (CA 19-9) (U/mL): 4.9 HER2 status: Unknown Microsatellite instability (MSI): Unknown Tumor location in stomach: Other Clinical staging modalities: Biopsy, Endoscopy Stage used in treatment planning: Yes National guidelines used in treatment planning: Yes Type of national guideline used in treatment planning: NCCN   09/28/2021 - 10/14/2021 Chemotherapy   Patient is on Treatment Plan : GASTROESOPHAGEAL FOLFOX + Nivolumab q14d     09/28/2021 -  Chemotherapy   Patient is on Treatment Plan : GASTROESOPHAGEAL FOLFOX + Nivolumab q14d     12/09/2021 Genetic Testing   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  Report date is 12/09/2021.   The Multi-Cancer +  RNA Panel offered by [facility] includes sequencing and/or deletion/duplication analysis of the following 84 genes:  AIP*, ALK, APC*, ATM*, AXIN2*, BAP1*, BARD1*, BLM*, BMPR1A*, BRCA1*, BRCA2*, BRIP1*, CASR, CDC73*, CDH1*, CDK4, CDKN1B*, CDKN1C*, CDKN2A, CEBPA, CHEK2*, CTNNA1*, DICER1*, DIS3L2*, EGFR, EPCAM, FH*, FLCN*, GATA2*, GPC3, GREM1, HOXB13, HRAS, KIT, MAX*, MEN1*, MET, MITF, MLH1*, MSH2*, MSH3*, MSH6*, MUTYH*, NBN*, NF1*, NF2*, NTHL1*, PALB2*, PDGFRA, PHOX2B, PMS2*, POLD1*, POLE*, POT1*, PRKAR1A*, PTCH1*, PTEN*, RAD50*, RAD51C*, RAD51D*, RB1*, RECQL4, RET, RUNX1*, SDHA*, SDHAF2*, SDHB*, SDHC*,  SDHD*, SMAD4*, SMARCA4*, SMARCB1*, SMARCE1*, STK11*, SUFU*, TERC, TERT, TMEM127*, Tp53*, TSC1*, TSC2*, VHL*, WRN*, and WT1.  RNA analysis is performed for * genes.     INTERVAL HISTORY:  August is here today for a follow-up for her gastric cancer. Patient states that she feels well and has no complaints of pain. She stated her last treatment with FOLFOX and Pembrolizumab went well and she only had fatigue. We have dropped the Oxaliplatin chemotherapy. I informed her that she can take a oral B-complex supplement to help control her neuropathy and I would even take a extra vitamin B-6. Her WBC is 7.7, hemoglobin is 11.6, and platelet count is 231,000. Her potassium has dropped from 4.2 to 3.4, I advised her to increase potassium in her diet and avoid Lasix unless absolutely necessary. Her creatinine also went from 1.0 to 1.40, she informed me that she did have fluids previously but not this time. I will schedule 1L of normal saline with IV potassium tomorrow @10  a.m. The rest of her CMP is normal. She will receive 1L normal saline next Monday and Wednesday along with her chemotherapy. Her day 1 cycle 14 of FOLFOX is scheduled on 06/14/2022 and day 3 pump D/C, cycle 14 is scheduled on 06/15/2022, If she doesn't feel well before the end of next week I advised her to call to schedule another liter of normal saline. [name] back in 2 weeks with CBC and CMP. She denies signs of infection such as sore throat, sinus drainage, cough, or urinary symptoms.  She denies fevers or recurrent chills. She denies pain. She denies nausea, vomiting, chest pain, dyspnea or cough. Her appetite is good and her weight has increased 3 pounds over last 10 days . She is accompanied with her daughter at today's visit.   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  If that happens, she is to call us so that we can schedule her for an ultrasound to look for synovitis.     Essential hypertension 09/09/2016   Gastric cancer (HCC) 09/10/2021   Hyperlipidemia 09/01/2016   Hypertension    Hypothyroidism 09/01/2016   Osteoarthritis of both feet 01/20/2016   Osteoarthritis, hand 01/20/2016   Thyroid disease   Degenerative disc disease History of endometriosis  Past Surgical History:  Procedure Laterality Date   APPENDECTOMY     LAPAROSCOPIC APPENDECTOMY N/A 04/10/2016   Procedure: APPENDECTOMY LAPAROSCOPIC;  Surgeon: Abigail Miyamoto, MD;  Location: [facility] OR;  Service:  General;  Laterality: N/A;  Bilateral tubal ligation Total hysterectomy and bilateral salpingo-oophorectomy in 1980  Family History  Problem Relation Age of Onset   Hypertension Mother    Prostate cancer Father        metastatic; d. 56   Brain cancer Sister 31   Breast cancer Sister 89   AAA (abdominal aortic aneurysm) Brother    Leukemia Cousin        x2 maternal female cousins; d. before 57   Breast cancer Daughter 83       DCIS  Her sister has had breast cancer as well as a tumor of her head Her daughter has had breast cancer last year  Social History:  reports that she has never smoked. She has never used smokeless tobacco. She reports that she does not currently use alcohol. She reports that she does not use drugs.The patient is accompanied by her son, daughter-in-law and daughter today.  She is single but lives with a significant other.  She has the 2 children.  She worked in Engineering geologist and denies any  chemical or toxin exposures.  She grew up in [place] and [place].  She is active and healthy, especially for her age.  Allergies:  Allergies  Allergen Reactions   Doxycycline Rash   Sulfa Antibiotics Rash and Other (See Comments)    Other reaction(s): Other (See Comments)  "Made me feel weird"    Current Medications: Current Outpatient Medications  Medication Sig Dispense Refill   aspirin 81 MG EC tablet Take 81 mg by mouth daily. Swallow whole.     b complex vitamins capsule Take 1 capsule by mouth daily.     Calcium Carbonate (CALCIUM 600 PO) Take 1 tablet by mouth daily.     Cholecalciferol (VITAMIN D3) 5000 units CAPS Take 1 capsule by mouth daily.     EXFORGE HCT 5-160-12.5 MG TABS Take 1 tablet by mouth daily.     famotidine (PEPCID) 40 MG tablet Take 40 mg by mouth at bedtime.     KRILL OIL PO Take 1 capsule by mouth daily. Unknown strenght     Lactobacillus TABS Take 1 tablet by mouth 2 (two) times daily.     levothyroxine (SYNTHROID, LEVOTHROID) 75 MCG tablet Take 75 mcg by mouth daily before breakfast.      magic mouthwash SOLN SWISH AND SWALLOW BY MOUTH EVERY THREE TO FOUR HOURS     NON FORMULARY MMW: 3 parts Maalox 2 parts Benadryl 1 part viscious lidicaine  Disp. 6oz  Instructions: 5ml swish and swallow every 3-4 hours     omeprazole (PRILOSEC) 40 MG capsule Take 1 capsule (40 mg total) by mouth 2 (two) times daily. 60 capsule 5   ondansetron (ZOFRAN-ODT) 4 MG disintegrating tablet Take 1 tablet (4 mg total) by mouth every 8 (eight) hours as needed for nausea or vomiting. 90 tablet 0   polyethylene glycol powder (GLYCOLAX/MIRALAX) 17 GM/SCOOP powder SMARTSIG:1 scoopful By Mouth Daily     potassium chloride SA (KLOR-CON M) 20 MEQ tablet Take 1 tablet (20 mEq total) by mouth 3 (three) times daily. 90 tablet 5   pravastatin (PRAVACHOL) 20 MG tablet Take 1 tablet (20 mg total) by mouth every evening. 90 tablet 3   predniSONE (DELTASONE) 10 MG tablet Take 2 tablets  (20 mg total) by mouth 2 (two) times daily with a meal. (Patient taking differently: Take 10 mg by mouth 2 (two) times daily with a meal. One 10mg  tablet twice a day) 100  tablet 1   Probiotic Product (PROBIOTIC DAILY PO) Take 1 tablet by mouth daily.     prochlorperazine (COMPAZINE) 10 MG tablet Take 1 tablet (10 mg total) by mouth every 6 (six) hours as needed for nausea or vomiting. 90 tablet 3   triamcinolone 0.1% oint-Eucerin equivalent cream 1:1 mixture Apply topically 2 (two) times daily. 160 g 0   triamcinolone cream (KENALOG) 0.1 % Apply to affected skin twice daily (Patient taking differently: daily as needed. Apply to affected skin twice daily) 160 g 0   No current facility-administered medications for this visit.    REVIEW OF SYSTEMS:  Review of Systems  Constitutional: Negative.  Negative for appetite change, chills, diaphoresis, fatigue, fever and unexpected weight change.  HENT:   Negative for hearing loss, lump/mass, mouth sores, nosebleeds, sore throat, tinnitus, trouble swallowing and voice change (improved).   Eyes:  Negative for eye problems and icterus.  Respiratory: Negative.  Negative for chest tightness, cough, hemoptysis, shortness of breath and wheezing.   Cardiovascular: Negative.  Negative for chest pain, leg swelling and palpitations.  Gastrointestinal:  Negative for abdominal distention, abdominal pain, blood in stool, constipation, diarrhea, nausea, rectal pain and vomiting.  Endocrine: Negative.  Negative for hot flashes.  Genitourinary: Negative.  Negative for bladder incontinence, difficulty urinating, dyspareunia, dysuria, frequency, hematuria, menstrual problem, nocturia, pelvic pain, vaginal bleeding and vaginal discharge.   Musculoskeletal: Negative.  Negative for arthralgias, back pain, flank pain, gait problem, myalgias, neck pain and neck stiffness.  Skin:  Positive for rash. Negative for itching and wound.        Occasional blotchy discoloration of her  lower extremities, but much improved   Neurological:  Positive for light-headedness and numbness (in her fingers and toes). Negative for dizziness, extremity weakness, gait problem, headaches, seizures and speech difficulty.  Hematological:  Negative for adenopathy. Bruises/bleeds easily.  Psychiatric/Behavioral: Negative.  Negative for confusion, decreased concentration, depression, sleep disturbance and suicidal ideas. The patient is not nervous/anxious.     VITALS:  Blood pressure (!) 143/68, pulse 86, temperature 97.8 F (36.6 C), temperature source Oral, resp. rate 18, height 5\' 4"  (1.626 m), weight 195 lb 11.2 oz (88.8 kg), SpO2 96 %.  Wt Readings from Last 3 Encounters:  06/18/22 195 lb 0.6 oz (88.5 kg)  06/17/22 199 lb (90.3 kg)  06/16/22 200 lb 0.6 oz (90.7 kg)    Body mass index is 33.59 kg/m.  Performance status (ECOG): 1 - Symptomatic but completely ambulatory  PHYSICAL EXAM:  Physical Exam Vitals and nursing note reviewed. Exam conducted with a chaperone present.  Constitutional:      General: She is not in acute distress.    Appearance: Normal appearance. She is normal weight. She is not ill-appearing, toxic-appearing or diaphoretic.  HENT:     Head: Normocephalic and atraumatic.     Right Ear: Tympanic membrane, ear canal and external ear normal. There is no impacted cerumen.     Left Ear: Tympanic membrane, ear canal and external ear normal. There is no impacted cerumen.     Nose: Nose normal. No congestion or rhinorrhea.     Mouth/Throat:     Mouth: Mucous membranes are moist.     Pharynx: Oropharynx is clear. No oropharyngeal exudate or posterior oropharyngeal erythema.  Eyes:     General: No scleral icterus.       Right eye: No discharge.        Left eye: No discharge.     Extraocular Movements: Extraocular movements  intact.     Conjunctiva/sclera: Conjunctivae normal.     Pupils: Pupils are equal, round, and reactive to light.  Neck:     Vascular: No  carotid bruit.  Cardiovascular:     Rate and Rhythm: Normal rate and regular rhythm.     Pulses: Normal pulses.     Heart sounds: Normal heart sounds. No murmur heard.    No friction rub. No gallop.  Pulmonary:     Effort: Pulmonary effort is normal. No respiratory distress.     Breath sounds: Normal breath sounds. No stridor. No wheezing, rhonchi or rales.  Chest:     Chest wall: No tenderness.  Abdominal:     General: Bowel sounds are normal. There is no distension.     Palpations: Abdomen is soft. There is no hepatomegaly, splenomegaly or mass.     Tenderness: There is no abdominal tenderness. There is no right CVA tenderness, left CVA tenderness, guarding or rebound.     Hernia: No hernia is present.     Comments: Mild firmness in the upper abdomen   Musculoskeletal:        General: No swelling, tenderness, deformity or signs of injury. Normal range of motion.     Cervical back: Normal range of motion and neck supple. No rigidity or tenderness.     Right lower leg: No edema.     Left lower leg: No edema.  Lymphadenopathy:     Cervical: No cervical adenopathy.     Right cervical: No superficial, deep or posterior cervical adenopathy.    Left cervical: No superficial, deep or posterior cervical adenopathy.     Upper Body:     Right upper body: No supraclavicular, axillary or pectoral adenopathy.     Left upper body: No supraclavicular, axillary or pectoral adenopathy.  Skin:    General: Skin is warm and dry.     Coloration: Skin is not jaundiced or pale.     Findings: No bruising, erythema, lesion or rash.     Comments: Fading rash of the lower extremities.   Neurological:     General: No focal deficit present.     Mental Status: She is alert and oriented to person, place, and time. Mental status is at baseline.     Cranial Nerves: No cranial nerve deficit.     Sensory: No sensory deficit.     Motor: No weakness.     Coordination: Coordination normal.     Gait: Gait normal.      Deep Tendon Reflexes: Reflexes normal.  Psychiatric:        Mood and Affect: Mood normal.        Behavior: Behavior normal.        Thought Content: Thought content normal.        Judgment: Judgment normal.    LABS:      Latest Ref Rng & Units 06/10/2022   12:00 AM 05/27/2022   12:00 AM 04/29/2022   12:00 AM  CBC  WBC  7.7     11.5     19.1      Hemoglobin 12.0 - 16.0 11.6     12.5     12.5      Hematocrit 36 - 46 33     36     37      Platelets 150 - 400 K/uL 231     155     209         This result is from  an external source.      Latest Ref Rng & Units 06/16/2022    3:15 PM 06/10/2022   12:00 AM 05/27/2022   12:00 AM  CMP  Glucose 70 - 99 mg/dL 91     BUN 8 - 23 mg/dL 23  21     20       Creatinine 0.44 - 1.00 mg/dL 1.61  1.4     1.0      Sodium 135 - 145 mmol/L 140  137     136      Potassium 3.5 - 5.1 mmol/L 3.2  3.4     4.2      Chloride 98 - 111 mmol/L 107  103     105      CO2 22 - 32 mmol/L 22  25     21       Calcium 8.9 - 10.3 mg/dL 9.0  9.3     9.8      Total Protein 6.5 - 8.1 g/dL 6.4     Total Bilirubin 0.3 - 1.2 mg/dL 0.8     Alkaline Phos 38 - 126 U/L 41  72     76      AST 15 - 41 U/L 21  26     29       ALT 0 - 44 U/L 19  16     28          This result is from an external source.   Lab Results  Component Value Date   CEA1 2.9 09/10/2021   /  CEA  Date Value Ref Range Status  09/10/2021 2.9 0.0 - 4.7 ng/mL Final    Comment:    (NOTE)                             Nonsmokers          <3.9                             Smokers             <5.6 [employer] Electrochemiluminescence Immunoassay (ECLIA) Values obtained with different assay methods or kits cannot be used interchangeably.  Results cannot be interpreted as absolute evidence of the presence or absence of malignant disease. Performed At: [facility] [address] [place], [place] [id number] Jolene Schimke MD [id number]:[id number]    Component Ref Range & Units 03/22/2022 6 mo ago   Glucose-Capillary 70 - 99 mg/dL 96 213 High  CM   Component Ref Range & Units 2 wk ago (05/27/22) 1 mo ago (04/29/22) 1 mo ago (04/15/22) 2 mo ago (04/07/22) 2 mo ago (03/24/22) 3 mo ago (02/16/22) 4 mo ago (01/28/22)  TSH 0.350 - 4.500 uIU/mL 0.990 0.410 CM 0.892 CM 1.895 CM 1.835 CM 2.087 CM 1.773 CM   Component Ref Range & Units 13 d ago (05/27/22) 1 mo ago (04/29/22) 1 mo ago (04/15/22) 2 mo ago (04/07/22) 2 mo ago (03/24/22) 3 mo ago (02/16/22) 4 mo ago (01/28/22)  T4, Total 4.5 - 12.0 ug/dL 9.6 7.6 CM 08.6 CM 57.8 CM 10.5 CM 12.5 High  CM 14.6 High  CM     No results found for: "PSA1" No results found for: "ION629" No results found for: "CAN125"  No results found for: "TOTALPROTELP", "ALBUMINELP", "A1GS", "A2GS", "BETS", "BETA2SER", "GAMS", "MSPIKE", "SPEI" No results found for: "TIBC", "FERRITIN", "  IRONPCTSAT" No results found for: "LDH"  STUDIES:  No results found.   EXAM: 03/22/2022 NUCLEAR MEDICINE PET SKULL BASE TO THIGH IMPRESSION: 1. Resolution of metabolic activity associated with the stomach. 2. Decrease in size of omental nodularity. No associated metabolic activity. 3. No evidence of new or progressive gastric carcinoma. 4. New peripheral consolidation in the RIGHT lower lobe with moderate metabolic activity. Favor focus of atelectasis versus less likely pneumonia. 5.  Aortic Atherosclerosis (ICD10-I70.0).    EXAM:12/16/21 CT CHEST, ABDOMEN, AND PELVIS WITH CONTRAST IMPRESSION: Response to therapy of gastric primary and adjacent omental nodularity. No new or progressive disease. Right larger than left lower lobe pulmonary nodules, felt to be similar to the 04/10/2016 remote CT and therefore benign. Progressive interstitial lung disease, right greater than left. This could be postinfectious/inflammatory or less likely represent nonspecific interstitial pneumonitis. Improvement in common duct dilation, likely incidental given chronicity.    I,Jasmine M  Lassiter,acting as a scribe for Dellia Beckwith, MD.,have documented all relevant documentation on the behalf of Dellia Beckwith, MD,as directed by  Dellia Beckwith, MD while in the presence of Dellia Beckwith, MD.

## 2022-06-10 ENCOUNTER — Inpatient Hospital Stay: Payer: Medicare Other

## 2022-06-10 ENCOUNTER — Inpatient Hospital Stay (INDEPENDENT_AMBULATORY_CARE_PROVIDER_SITE_OTHER): Payer: Medicare Other | Admitting: Oncology

## 2022-06-10 ENCOUNTER — Encounter: Payer: Self-pay | Admitting: Oncology

## 2022-06-10 ENCOUNTER — Other Ambulatory Visit: Payer: Self-pay | Admitting: Oncology

## 2022-06-10 VITALS — BP 143/68 | HR 86 | Temp 97.8°F | Resp 18 | Ht 64.0 in | Wt 195.7 lb

## 2022-06-10 DIAGNOSIS — E782 Mixed hyperlipidemia: Secondary | ICD-10-CM | POA: Diagnosis not present

## 2022-06-10 DIAGNOSIS — Z79899 Other long term (current) drug therapy: Secondary | ICD-10-CM | POA: Diagnosis not present

## 2022-06-10 DIAGNOSIS — C168 Malignant neoplasm of overlapping sites of stomach: Secondary | ICD-10-CM | POA: Diagnosis not present

## 2022-06-10 DIAGNOSIS — C169 Malignant neoplasm of stomach, unspecified: Secondary | ICD-10-CM | POA: Diagnosis not present

## 2022-06-10 DIAGNOSIS — Z5111 Encounter for antineoplastic chemotherapy: Secondary | ICD-10-CM | POA: Diagnosis not present

## 2022-06-10 DIAGNOSIS — D649 Anemia, unspecified: Secondary | ICD-10-CM | POA: Diagnosis not present

## 2022-06-10 LAB — BASIC METABOLIC PANEL
BUN: 21 (ref 4–21)
CO2: 25 — AB (ref 13–22)
Chloride: 103 (ref 99–108)
Creatinine: 1.4 — AB (ref 0.5–1.1)
EGFR: 36
Glucose: 117
Potassium: 3.4 mEq/L — AB (ref 3.5–5.1)
Sodium: 137 (ref 137–147)

## 2022-06-10 LAB — HEPATIC FUNCTION PANEL
ALT: 16 U/L (ref 7–35)
AST: 26 (ref 13–35)
Alkaline Phosphatase: 72 (ref 25–125)
Bilirubin, Total: 0.8

## 2022-06-10 LAB — CBC: RBC: 3.75 — AB (ref 3.87–5.11)

## 2022-06-10 LAB — CBC AND DIFFERENTIAL
HCT: 33 — AB (ref 36–46)
Hemoglobin: 11.6 — AB (ref 12.0–16.0)
Neutrophils Absolute: 4.93
Platelets: 231 10*3/uL (ref 150–400)
WBC: 7.7

## 2022-06-10 LAB — LIPID PANEL
Cholesterol: 176 mg/dL (ref 0–200)
HDL: 77 mg/dL (ref >40)
LDL Cholesterol: 70 mg/dL (ref 0–99)
Total CHOL/HDL Ratio: 2.3 RATIO
Triglycerides: 145 mg/dL (ref <150)
VLDL: 29 mg/dL (ref 0–40)

## 2022-06-10 LAB — COMPREHENSIVE METABOLIC PANEL
Albumin: 4.3 (ref 3.5–5.0)
Calcium: 9.3 (ref 8.7–10.7)

## 2022-06-10 LAB — TSH: TSH: 2.595 u[IU]/mL (ref 0.350–4.500)

## 2022-06-10 LAB — VITAMIN B12: Vitamin B-12: 302 pg/mL (ref 180–914)

## 2022-06-10 LAB — CBC W DIFFERENTIAL (~~LOC~~ CC SCANNED REPORT)

## 2022-06-10 NOTE — Addendum Note (Signed)
Addended by: Domenic Schwab on: 06/10/2022 10:14 AM   Modules accepted: Orders

## 2022-06-11 ENCOUNTER — Other Ambulatory Visit: Payer: Self-pay

## 2022-06-11 ENCOUNTER — Inpatient Hospital Stay: Payer: Medicare Other

## 2022-06-11 VITALS — BP 146/78 | HR 96 | Temp 97.5°F | Resp 16 | Wt 197.0 lb

## 2022-06-11 DIAGNOSIS — R7989 Other specified abnormal findings of blood chemistry: Secondary | ICD-10-CM

## 2022-06-11 DIAGNOSIS — Z5111 Encounter for antineoplastic chemotherapy: Secondary | ICD-10-CM | POA: Diagnosis not present

## 2022-06-11 DIAGNOSIS — E860 Dehydration: Secondary | ICD-10-CM

## 2022-06-11 DIAGNOSIS — E876 Hypokalemia: Secondary | ICD-10-CM

## 2022-06-11 DIAGNOSIS — Z79899 Other long term (current) drug therapy: Secondary | ICD-10-CM | POA: Diagnosis not present

## 2022-06-11 DIAGNOSIS — C169 Malignant neoplasm of stomach, unspecified: Secondary | ICD-10-CM | POA: Diagnosis not present

## 2022-06-11 MED ORDER — POTASSIUM CHLORIDE IN NACL 20-0.9 MEQ/L-% IV SOLN
Freq: Once | INTRAVENOUS | Status: AC
  Filled 2022-06-11: qty 1000

## 2022-06-11 MED ORDER — HEPARIN SOD (PORK) LOCK FLUSH 100 UNIT/ML IV SOLN: 500.0000 [IU] | Freq: Once | INTRAVENOUS | Status: DC | PRN

## 2022-06-11 MED ORDER — SODIUM CHLORIDE 0.9% FLUSH: 10.0000 mL | Freq: Once | INTRAVENOUS | Status: DC | PRN

## 2022-06-11 MED FILL — Dexamethasone Sodium Phosphate Inj 100 MG/10ML: INTRAMUSCULAR | Qty: 1 | Status: AC

## 2022-06-11 MED FILL — Fluorouracil IV Soln 5 GM/100ML (50 MG/ML): INTRAVENOUS | Qty: 77 | Status: AC

## 2022-06-11 MED FILL — Leucovorin Calcium For Inj 350 MG: INTRAMUSCULAR | Qty: 31.5 | Status: AC

## 2022-06-11 MED FILL — Nivolumab IV Soln 240 MG/24ML: INTRAVENOUS | Qty: 24 | Status: AC

## 2022-06-11 MED FILL — Fluorouracil IV Soln 2.5 GM/50ML (50 MG/ML): INTRAVENOUS | Qty: 13 | Status: AC

## 2022-06-11 NOTE — Patient Instructions (Signed)
Hypokalemia Hypokalemia means that the amount of potassium in the blood is lower than normal. Potassium is a mineral (electrolyte) that helps regulate the amount of fluid in the body. It also stimulates muscle tightening (contraction) and helps nerves work properly. Normally, most of the body's potassium is inside cells, and only a very small amount is in the blood. Because the amount in the blood is so small, minor changes to potassium levels in the blood can be life-threatening. What are the causes? This condition may be caused by: Antibiotic medicine. Diarrhea or vomiting. Taking too much of a medicine that helps you have a bowel movement (laxative) can cause diarrhea and lead to hypokalemia. Chronic kidney disease (CKD). Medicines that help the body get rid of excess fluid (diuretics). Eating disorders, such as anorexia or bulimia. Low magnesium levels in the body. Sweating a lot. What are the signs or symptoms? Symptoms of this condition include: Weakness. Constipation. Fatigue. Muscle cramps. Mental confusion. Skipped heartbeats or irregular heartbeat (palpitations). Tingling or numbness. How is this diagnosed? This condition is diagnosed with a blood test. How is this treated? This condition may be treated by: Taking potassium supplements. Adjusting the medicines that you take. Eating more foods that contain a lot of potassium. If your potassium level is very low, you may need to get potassium through an IV and be monitored in the hospital. Follow these instructions at home: Eating and drinking  Eat a healthy diet. A healthy diet includes fresh fruits and vegetables, whole grains, healthy fats, and lean proteins. If told, eat more foods that contain a lot of potassium. These include: Nuts, such as peanuts and pistachios. Seeds, such as sunflower seeds and pumpkin seeds. Peas, lentils, and lima beans. Whole grain and bran cereals and breads. Fresh fruits and vegetables,  such as apricots, avocado, bananas, cantaloupe, kiwi, oranges, tomatoes, asparagus, and potatoes. Juices, such as orange, tomato, and prune. Lean meats, including fish. Milk and milk products, such as yogurt. General instructions Take over-the-counter and prescription medicines only as told by your health care provider. This includes vitamins, natural food products, and supplements. Keep all follow-up visits. This is important. Contact a health care provider if: You have weakness that gets worse. You feel your heart pounding or racing. You vomit. You have diarrhea. You have diabetes and you have trouble keeping your blood sugar in your target range. Get help right away if: You have chest pain. You have shortness of breath. You have vomiting or diarrhea that lasts for more than 2 days. You faint. These symptoms may be an emergency. Get help right away. Call 911. Do not wait to see if the symptoms will go away. Do not drive yourself to the hospital. Summary Hypokalemia means that the amount of potassium in the blood is lower than normal. This condition is diagnosed with a blood test. Hypokalemia may be treated by taking potassium supplements, adjusting the medicines that you take, or eating more foods that are high in potassium. If your potassium level is very low, you may need to get potassium through an IV and be monitored in the hospital. This information is not intended to replace advice given to you by your health care provider. Make sure you discuss any questions you have with your health care provider. Document Revised: 10/16/2020 Document Reviewed: 10/16/2020 Elsevier Patient Education  2023 Elsevier Inc.  

## 2022-06-12 LAB — T4: T4, Total: 10.5 ug/dL (ref 4.5–12.0)

## 2022-06-14 ENCOUNTER — Inpatient Hospital Stay: Payer: Medicare Other

## 2022-06-14 VITALS — BP 154/65 | HR 100 | Temp 98.2°F | Resp 18 | Ht 64.0 in | Wt 197.2 lb

## 2022-06-14 DIAGNOSIS — C169 Malignant neoplasm of stomach, unspecified: Secondary | ICD-10-CM | POA: Diagnosis not present

## 2022-06-14 DIAGNOSIS — E860 Dehydration: Secondary | ICD-10-CM

## 2022-06-14 DIAGNOSIS — Z79899 Other long term (current) drug therapy: Secondary | ICD-10-CM | POA: Diagnosis not present

## 2022-06-14 DIAGNOSIS — Z5111 Encounter for antineoplastic chemotherapy: Secondary | ICD-10-CM | POA: Diagnosis not present

## 2022-06-14 DIAGNOSIS — C168 Malignant neoplasm of overlapping sites of stomach: Secondary | ICD-10-CM

## 2022-06-14 DIAGNOSIS — R7989 Other specified abnormal findings of blood chemistry: Secondary | ICD-10-CM

## 2022-06-14 MED ORDER — SODIUM CHLORIDE 0.9 % IV SOLN
1960.0000 mg/m2 | INTRAVENOUS | Status: DC
  Administered 2022-06-14: 3850 mg via INTRAVENOUS
  Filled 2022-06-14: qty 77

## 2022-06-14 MED ORDER — SODIUM CHLORIDE 0.9 % IV SOLN
240.0000 mg | Freq: Once | INTRAVENOUS | Status: AC
  Administered 2022-06-14: 240 mg via INTRAVENOUS
  Filled 2022-06-14: qty 24

## 2022-06-14 MED ORDER — DEXTROSE 5 % IV SOLN: Freq: Once | INTRAVENOUS | Status: AC

## 2022-06-14 MED ORDER — SODIUM CHLORIDE 0.9 % IV SOLN
10.0000 mg | Freq: Once | INTRAVENOUS | Status: AC
  Administered 2022-06-14: 10 mg via INTRAVENOUS
  Filled 2022-06-14: qty 10

## 2022-06-14 MED ORDER — SODIUM CHLORIDE 0.9 % IV SOLN: Freq: Once | INTRAVENOUS | Status: AC

## 2022-06-14 MED ORDER — FLUOROURACIL CHEMO INJECTION 2.5 GM/50ML
320.0000 mg/m2 | Freq: Once | INTRAVENOUS | Status: AC
  Administered 2022-06-14: 650 mg via INTRAVENOUS
  Filled 2022-06-14: qty 13

## 2022-06-14 MED ORDER — PALONOSETRON HCL INJECTION 0.25 MG/5ML
0.2500 mg | Freq: Once | INTRAVENOUS | Status: AC
  Administered 2022-06-14: 0.25 mg via INTRAVENOUS
  Filled 2022-06-14: qty 5

## 2022-06-14 MED ORDER — LEUCOVORIN CALCIUM INJECTION 350 MG
320.0000 mg/m2 | Freq: Once | INTRAVENOUS | Status: AC
  Administered 2022-06-14: 630 mg via INTRAVENOUS
  Filled 2022-06-14: qty 31.5

## 2022-06-14 NOTE — Patient Instructions (Signed)
Dehydration, Adult Dehydration is a condition in which there is not enough water or other fluids in the body. This happens when a person loses more fluids than they take in. Important organs cannot work right without the right amount of fluids. Any loss of fluids from the body can cause dehydration. Dehydration can be mild, worse, or very bad. It should be treated right away to keep it from getting very bad. What are the causes? Conditions that cause loss of water in the body. They include: Watery poop (diarrhea). Vomiting. Sweating a lot. Fever. Infection. Peeing (urinating) a lot. Not drinking enough fluids. Certain medicines, such as medicines that take extra fluid out of the body (diuretics). Lack of safe drinking water. Not being able to get enough water and food. What increases the risk? Having a long-term (chronic) illness that has not been treated the right way, such as: Diabetes. Heart disease. Kidney disease. Being 65 years of age or older. Having a disability. Living in a place that is high above the ground or sea (high in altitude). The thinner, drier air causes more fluid loss. Doing exercises that put stress on your body for a long time. Being active when in hot places. What are the signs or symptoms? Symptoms of dehydration depend on how bad it is. Mild or worse dehydration Thirst. Dry lips or dry mouth. Feeling dizzy or light-headed. Muscle cramps. Passing little pee or dark pee. Pee may be the color of tea. Headache. Very bad dehydration Changes in skin. Skin may: Be cold to the touch (clammy). Be blotchy or pale. Not go back to normal right after you pinch it and let it go. Little or no tears, pee, or sweat. Fast breathing. Low blood pressure. Weak pulse. Pulse that is more than 100 beats a minute when you are sitting still. Other changes, such as: Feeling very thirsty. Eyes that look hollow (sunken). Cold hands and feet. Being confused. Being very  tired (lethargic) or having trouble waking from sleep. Losing weight. Loss of consciousness. How is this treated? Treatment for this condition depends on how bad your dehydration is. Treatment should start right away. Do not wait until your condition gets very bad. Very bad dehydration is an emergency. You will need to go to a hospital. Mild or worse dehydration can be treated at home. You may be asked to: Drink more fluids. Drink an oral rehydration solution (ORS). This drink gives you the right amount of fluids, salts, and minerals (electrolytes). Very bad dehydration can be treated: With fluids through an IV tube. By correcting low levels of electrolytes in the body. By treating the problem that caused your dehydration. Follow these instructions at home: Oral rehydration solution If told by your doctor, drink an ORS: Make an ORS. Use instructions on the package. Start by drinking small amounts, about  cup (120 mL) every 5-10 minutes. Slowly drink more until you have had the amount that your doctor said to have.  Eating and drinking  Drink enough clear fluid to keep your pee pale yellow. If you were told to drink an ORS, finish the ORS first. Then, start slowly drinking other clear fluids. Drink fluids such as: Water. Do not drink only water. Doing that can make the salt (sodium) level in your body get too low. Water from ice chips you suck on. Fruit juice that you have added water to (diluted). Low-calorie sports drinks. Eat foods that have the right amounts of salts and minerals, such as bananas, oranges, potatoes,   tomatoes, or spinach. Do not drink alcohol. Avoid drinks that have caffeine or sugar. These include:: High-calorie sports drinks. Fruit juice that you did not add water to. Soda. Coffee or energy drinks. Avoid foods that are greasy or have a lot of fat or sugar. General instructions Take over-the-counter and prescription medicines only as told by your doctor. Do  not take sodium tablets. Doing that can make the salt level in your body get too high. Return to your normal activities as told by your doctor. Ask your doctor what activities are safe for you. Keep all follow-up visits. Your doctor may check and change your treatment. Contact a doctor if: You have pain in your belly (abdomen) and the pain: Gets worse. Stays in one place. You have a rash. You have a stiff neck. You get angry or annoyed more easily than normal. You are more tired or have a harder time waking than normal. You feel weak or dizzy. You feel very thirsty. Get help right away if: You have any symptoms of very bad dehydration. You vomit every time you eat or drink. Your vomiting gets worse, does not go away, or you vomit blood or green stuff. You are getting treatment, but symptoms are getting worse. You have a fever. You have a very bad headache. You have: Diarrhea that gets worse or does not go away. Blood in your poop (stool). This may cause poop to look black and tarry. No pee in 6-8 hours. Only a small amount of pee in 6-8 hours, and the pee is very dark. You have trouble breathing. These symptoms may be an emergency. Get help right away. Call 911. Do not wait to see if the symptoms will go away. Do not drive yourself to the hospital. This information is not intended to replace advice given to you by your health care provider. Make sure you discuss any questions you have with your health care provider. Document Revised: 08/31/2021 Document Reviewed: 08/31/2021 Elsevier Patient Education  2023 [employer] Inc.  

## 2022-06-16 ENCOUNTER — Inpatient Hospital Stay: Payer: Medicare Other | Attending: Hematology and Oncology

## 2022-06-16 ENCOUNTER — Inpatient Hospital Stay: Payer: Medicare Other

## 2022-06-16 VITALS — BP 149/64 | HR 85 | Temp 97.9°F | Resp 18 | Wt 200.0 lb

## 2022-06-16 DIAGNOSIS — Z79899 Other long term (current) drug therapy: Secondary | ICD-10-CM | POA: Diagnosis not present

## 2022-06-16 DIAGNOSIS — C169 Malignant neoplasm of stomach, unspecified: Secondary | ICD-10-CM | POA: Insufficient documentation

## 2022-06-16 DIAGNOSIS — R7989 Other specified abnormal findings of blood chemistry: Secondary | ICD-10-CM

## 2022-06-16 DIAGNOSIS — E860 Dehydration: Secondary | ICD-10-CM

## 2022-06-16 DIAGNOSIS — C168 Malignant neoplasm of overlapping sites of stomach: Secondary | ICD-10-CM

## 2022-06-16 DIAGNOSIS — Z5111 Encounter for antineoplastic chemotherapy: Secondary | ICD-10-CM | POA: Diagnosis not present

## 2022-06-16 LAB — COMPREHENSIVE METABOLIC PANEL
ALT: 19 U/L (ref 0–44)
AST: 21 U/L (ref 15–41)
Albumin: 3.6 g/dL (ref 3.5–5.0)
Alkaline Phosphatase: 41 U/L (ref 38–126)
Anion gap: 11 (ref 5–15)
BUN: 23 mg/dL (ref 8–23)
CO2: 22 mmol/L (ref 22–32)
Calcium: 9 mg/dL (ref 8.9–10.3)
Chloride: 107 mmol/L (ref 98–111)
Creatinine, Ser: 1.27 mg/dL — ABNORMAL HIGH (ref 0.44–1.00)
GFR, Estimated: 41 mL/min — ABNORMAL LOW (ref >60)
Glucose, Bld: 91 mg/dL (ref 70–99)
Potassium: 3.2 mmol/L — ABNORMAL LOW (ref 3.5–5.1)
Sodium: 140 mmol/L (ref 135–145)
Total Bilirubin: 0.8 mg/dL (ref 0.3–1.2)
Total Protein: 6.4 g/dL — ABNORMAL LOW (ref 6.5–8.1)

## 2022-06-16 MED ORDER — HEPARIN SOD (PORK) LOCK FLUSH 100 UNIT/ML IV SOLN
500.0000 [IU] | Freq: Once | INTRAVENOUS | Status: AC | PRN
  Administered 2022-06-16: 500 [IU]

## 2022-06-16 MED ORDER — SODIUM CHLORIDE 0.9 % IV SOLN: Freq: Once | INTRAVENOUS | Status: AC

## 2022-06-16 MED ORDER — SODIUM CHLORIDE 0.9% FLUSH
10.0000 mL | INTRAVENOUS | Status: DC | PRN
  Administered 2022-06-16: 10 mL

## 2022-06-16 NOTE — Patient Instructions (Signed)
Dehydration, Adult Dehydration is a condition in which there is not enough water or other fluids in the body. This happens when a person loses more fluids than they take in. Important organs cannot work right without the right amount of fluids. Any loss of fluids from the body can cause dehydration. Dehydration can be mild, worse, or very bad. It should be treated right away to keep it from getting very bad. What are the causes? Conditions that cause loss of water in the body. They include: Watery poop (diarrhea). Vomiting. Sweating a lot. Fever. Infection. Peeing (urinating) a lot. Not drinking enough fluids. Certain medicines, such as medicines that take extra fluid out of the body (diuretics). Lack of safe drinking water. Not being able to get enough water and food. What increases the risk? Having a long-term (chronic) illness that has not been treated the right way, such as: Diabetes. Heart disease. Kidney disease. Being 65 years of age or older. Having a disability. Living in a place that is high above the ground or sea (high in altitude). The thinner, drier air causes more fluid loss. Doing exercises that put stress on your body for a long time. Being active when in hot places. What are the signs or symptoms? Symptoms of dehydration depend on how bad it is. Mild or worse dehydration Thirst. Dry lips or dry mouth. Feeling dizzy or light-headed. Muscle cramps. Passing little pee or dark pee. Pee may be the color of tea. Headache. Very bad dehydration Changes in skin. Skin may: Be cold to the touch (clammy). Be blotchy or pale. Not go back to normal right after you pinch it and let it go. Little or no tears, pee, or sweat. Fast breathing. Low blood pressure. Weak pulse. Pulse that is more than 100 beats a minute when you are sitting still. Other changes, such as: Feeling very thirsty. Eyes that look hollow (sunken). Cold hands and feet. Being confused. Being very  tired (lethargic) or having trouble waking from sleep. Losing weight. Loss of consciousness. How is this treated? Treatment for this condition depends on how bad your dehydration is. Treatment should start right away. Do not wait until your condition gets very bad. Very bad dehydration is an emergency. You will need to go to a hospital. Mild or worse dehydration can be treated at home. You may be asked to: Drink more fluids. Drink an oral rehydration solution (ORS). This drink gives you the right amount of fluids, salts, and minerals (electrolytes). Very bad dehydration can be treated: With fluids through an IV tube. By correcting low levels of electrolytes in the body. By treating the problem that caused your dehydration. Follow these instructions at home: Oral rehydration solution If told by your doctor, drink an ORS: Make an ORS. Use instructions on the package. Start by drinking small amounts, about  cup (120 mL) every 5-10 minutes. Slowly drink more until you have had the amount that your doctor said to have.  Eating and drinking  Drink enough clear fluid to keep your pee pale yellow. If you were told to drink an ORS, finish the ORS first. Then, start slowly drinking other clear fluids. Drink fluids such as: Water. Do not drink only water. Doing that can make the salt (sodium) level in your body get too low. Water from ice chips you suck on. Fruit juice that you have added water to (diluted). Low-calorie sports drinks. Eat foods that have the right amounts of salts and minerals, such as bananas, oranges, potatoes,   tomatoes, or spinach. Do not drink alcohol. Avoid drinks that have caffeine or sugar. These include:: High-calorie sports drinks. Fruit juice that you did not add water to. Soda. Coffee or energy drinks. Avoid foods that are greasy or have a lot of fat or sugar. General instructions Take over-the-counter and prescription medicines only as told by your doctor. Do  not take sodium tablets. Doing that can make the salt level in your body get too high. Return to your normal activities as told by your doctor. Ask your doctor what activities are safe for you. Keep all follow-up visits. Your doctor may check and change your treatment. Contact a doctor if: You have pain in your belly (abdomen) and the pain: Gets worse. Stays in one place. You have a rash. You have a stiff neck. You get angry or annoyed more easily than normal. You are more tired or have a harder time waking than normal. You feel weak or dizzy. You feel very thirsty. Get help right away if: You have any symptoms of very bad dehydration. You vomit every time you eat or drink. Your vomiting gets worse, does not go away, or you vomit blood or green stuff. You are getting treatment, but symptoms are getting worse. You have a fever. You have a very bad headache. You have: Diarrhea that gets worse or does not go away. Blood in your poop (stool). This may cause poop to look black and tarry. No pee in 6-8 hours. Only a small amount of pee in 6-8 hours, and the pee is very dark. You have trouble breathing. These symptoms may be an emergency. Get help right away. Call 911. Do not wait to see if the symptoms will go away. Do not drive yourself to the hospital. This information is not intended to replace advice given to you by your health care provider. Make sure you discuss any questions you have with your health care provider. Document Revised: 08/31/2021 Document Reviewed: 08/31/2021 Elsevier Patient Education  2023 [employer] Inc.  

## 2022-06-17 ENCOUNTER — Other Ambulatory Visit: Payer: Self-pay | Admitting: Oncology

## 2022-06-17 ENCOUNTER — Telehealth: Payer: Self-pay

## 2022-06-17 ENCOUNTER — Ambulatory Visit: Payer: Medicare Other | Attending: Cardiology | Admitting: Cardiology

## 2022-06-17 ENCOUNTER — Encounter: Payer: Self-pay | Admitting: Oncology

## 2022-06-17 ENCOUNTER — Encounter: Payer: Self-pay | Admitting: Cardiology

## 2022-06-17 VITALS — BP 150/80 | HR 88 | Ht 64.0 in | Wt 199.0 lb

## 2022-06-17 DIAGNOSIS — I129 Hypertensive chronic kidney disease with stage 1 through stage 4 chronic kidney disease, or unspecified chronic kidney disease: Secondary | ICD-10-CM | POA: Diagnosis not present

## 2022-06-17 DIAGNOSIS — I10 Essential (primary) hypertension: Secondary | ICD-10-CM | POA: Diagnosis not present

## 2022-06-17 DIAGNOSIS — R0600 Dyspnea, unspecified: Secondary | ICD-10-CM | POA: Insufficient documentation

## 2022-06-17 DIAGNOSIS — C168 Malignant neoplasm of overlapping sites of stomach: Secondary | ICD-10-CM

## 2022-06-17 DIAGNOSIS — E782 Mixed hyperlipidemia: Secondary | ICD-10-CM | POA: Insufficient documentation

## 2022-06-17 DIAGNOSIS — E876 Hypokalemia: Secondary | ICD-10-CM

## 2022-06-17 MED ORDER — POTASSIUM CHLORIDE CRYS ER 20 MEQ PO TBCR
20.0000 meq | EXTENDED_RELEASE_TABLET | Freq: Three times a day (TID) | ORAL | 5 refills | Status: DC
Start: 2022-06-17 — End: 2022-07-23

## 2022-06-17 NOTE — Telephone Encounter (Signed)
PATIENT CALLED SHE WILL BE HERE BY 1100 ON 5/3 FOR IV POTASSIUM./RB

## 2022-06-17 NOTE — Telephone Encounter (Signed)
-----   Message from Dellia Beckwith, MD sent at 06/17/2022  1:39 PM EDT ----- Regarding: IV K Tell her kidney function a little better but K is lower.  She needs to increase K to tid but also needs IV K 20 meq - can we arrange?  I guess I will need to send in new Rx also

## 2022-06-17 NOTE — Progress Notes (Signed)
Cardiology Office Note:    Date:  06/17/2022   ID:  Mackenzie Lewis, DOB 01/11/1936, MRN [id number]  PCP:  Philemon Kingdom, MD  Cardiologist:  Gypsy Balsam, MD    Referring MD: Philemon Kingdom, MD   Chief Complaint  Patient presents with   Annual Exam    History of Present Illness:    Mackenzie Lewis is a 87 y.o. female with past medical history significant for essential hypertension, chronic kidney failure, dyslipidemia.  Initially she was sent to [place] because of a epigastric discomfort.  Proton pump inhibitor has been started which improved her symptomatology but after that she had EGD done and she was find to have stomach cancer.  She is being aggressively treated with chemotherapy and comes today to months for follow-up describe 1 episode of chest pain that happened after chemotherapy.  At the same time she is able to walk climb stairs with not much difficulties.  She goes to shopping with no problems  Past Medical History:  Diagnosis Date   Appendicitis with peritonitis 04/10/2016   Atypical chest pain 09/09/2016   Benign hypertensive renal disease 09/01/2016   Bilateral primary osteoarthritis of knee 01/20/2016   Borderline diabetes 09/09/2016   CKD (chronic kidney disease), stage II 09/01/2016   Cyclic citrullinated peptide (CCP) antibody positive 01/20/2016   Because she has positive CCP, I want to make sure we monitor the patient closely and we encouraged the patient to look for symptoms that include increased hand stiffness, swelling and redness to the MCP joint.  If that happens, she is to call us so that we can schedule her for an ultrasound to look for synovitis.     Essential hypertension 09/09/2016   Gastric cancer (HCC) 09/10/2021   Hyperlipidemia 09/01/2016   Hypertension    Hypothyroidism 09/01/2016   Osteoarthritis of both feet 01/20/2016   Osteoarthritis, hand 01/20/2016   Thyroid disease     Past Surgical History:  Procedure Laterality Date   APPENDECTOMY      LAPAROSCOPIC APPENDECTOMY N/A 04/10/2016   Procedure: APPENDECTOMY LAPAROSCOPIC;  Surgeon: Abigail Miyamoto, MD;  Location: [facility] OR;  Service: General;  Laterality: N/A;    Current Medications: Current Meds  Medication Sig   aspirin 81 MG EC tablet Take 81 mg by mouth daily. Swallow whole.   b complex vitamins capsule Take 1 capsule by mouth daily.   Calcium Carbonate (CALCIUM 600 PO) Take 1 tablet by mouth daily.   Cholecalciferol (VITAMIN D3) 5000 units CAPS Take 1 capsule by mouth daily.   EXFORGE HCT 5-160-12.5 MG TABS Take 1 tablet by mouth daily.   famotidine (PEPCID) 40 MG tablet Take 40 mg by mouth at bedtime.   KRILL OIL PO Take 1 capsule by mouth daily. Unknown strenght   Lactobacillus TABS Take 1 tablet by mouth 2 (two) times daily.   levothyroxine (SYNTHROID, LEVOTHROID) 75 MCG tablet Take 75 mcg by mouth daily before breakfast.    magic mouthwash SOLN SWISH AND SWALLOW BY MOUTH EVERY THREE TO FOUR HOURS   NON FORMULARY MMW: 3 parts Maalox 2 parts Benadryl 1 part viscious lidicaine  Disp. 6oz  Instructions: 5ml swish and swallow every 3-4 hours   omeprazole (PRILOSEC) 40 MG capsule Take 1 capsule (40 mg total) by mouth 2 (two) times daily.   ondansetron (ZOFRAN-ODT) 4 MG disintegrating tablet Take 1 tablet (4 mg total) by mouth every 8 (eight) hours as needed for nausea or vomiting.   polyethylene glycol powder (GLYCOLAX/MIRALAX) 17 GM/SCOOP  powder SMARTSIG:1 scoopful By Mouth Daily   potassium chloride SA (KLOR-CON M) 20 MEQ tablet Take 1 tablet (20 mEq total) by mouth daily.   pravastatin (PRAVACHOL) 20 MG tablet Take 1 tablet (20 mg total) by mouth every evening.   predniSONE (DELTASONE) 10 MG tablet Take 2 tablets (20 mg total) by mouth 2 (two) times daily with a meal. (Patient taking differently: Take 10 mg by mouth 2 (two) times daily with a meal. One 10mg  tablet twice a day)   Probiotic Product (PROBIOTIC DAILY PO) Take 1 tablet by mouth daily.   prochlorperazine  (COMPAZINE) 10 MG tablet Take 1 tablet (10 mg total) by mouth every 6 (six) hours as needed for nausea or vomiting.   triamcinolone 0.1% oint-Eucerin equivalent cream 1:1 mixture Apply topically 2 (two) times daily.   triamcinolone cream (KENALOG) 0.1 % Apply to affected skin twice daily (Patient taking differently: daily as needed. Apply to affected skin twice daily)     Allergies:   Doxycycline and Sulfa antibiotics   Social History   Socioeconomic History   Marital status: Divorced    Spouse name: Not on file   Number of children: 2   Years of education: 12   Highest education level: 12th grade  Occupational History   Occupation: RETIRED  Tobacco Use   Smoking status: Never   Smokeless tobacco: Never  Vaping Use   Vaping Use: Never used  Substance and Sexual Activity   Alcohol use: Not Currently    Comment: occasionally/ 1 time per month   Drug use: Never   Sexual activity: Not Currently  Other Topics Concern   Not on file  Social History Narrative   Not on file   Social Determinants of Health   Financial Resource Strain: Low Risk  (01/04/2022)   Overall Financial Resource Strain (CARDIA)    Difficulty of Paying Living Expenses: Not hard at all  Food Insecurity: No Food Insecurity (01/04/2022)   Hunger Vital Sign    Worried About Running Out of Food in the Last Year: Never true    Ran Out of Food in the Last Year: Never true  Transportation Needs: No Transportation Needs (01/04/2022)   PRAPARE - Administrator, Civil Service (Medical): No    Lack of Transportation (Non-Medical): No  Physical Activity: Inactive (01/04/2022)   Exercise Vital Sign    Days of Exercise per Week: 0 days    Minutes of Exercise per Session: 0 min  Stress: No Stress Concern Present (01/04/2022)   [employer] of Occupational Health - Occupational Stress Questionnaire    Feeling of Stress : Only a little  Social Connections: Socially Integrated (01/04/2022)   Social  Connection and Isolation Panel [NHANES]    Frequency of Communication with Friends and Family: More than three times a week    Frequency of Social Gatherings with Friends and Family: More than three times a week    Attends Religious Services: More than 4 times per year    Active Member of [employer] or Organizations: Yes    Attends Engineer, structural: More than 4 times per year    Marital Status: Living with partner     Family History: The patient's family history includes AAA (abdominal aortic aneurysm) in her brother; Brain cancer (age of onset: 37) in her sister; Breast cancer (age of onset: 27) in her daughter; Breast cancer (age of onset: 63) in her sister; Hypertension in her mother; Leukemia in her cousin; Prostate cancer  in her father. ROS:   Please see the history of present illness.    All 14 point review of systems negative except as described per history of present illness  EKGs/Labs/Other Studies Reviewed:      Recent Labs: 06/10/2022: Hemoglobin 11.6; Platelets 231; TSH 2.595 06/16/2022: ALT 19; BUN 23; Creatinine, Ser 1.27; Potassium 3.2; Sodium 140  Recent Lipid Panel    Component Value Date/Time   CHOL 176 06/10/2022 0853   CHOL 181 03/19/2020 1148   TRIG 145 06/10/2022 0853   HDL 77 06/10/2022 0853   HDL 64 03/19/2020 1148   CHOLHDL 2.3 06/10/2022 0853   VLDL 29 06/10/2022 0853   LDLCALC 70 06/10/2022 0853   LDLCALC 92 03/19/2020 1148    Physical Exam:    VS:  BP (!) 150/80 (BP Location: Right Arm, Patient Position: Sitting, Cuff Size: Normal)   Pulse 88   Ht 5\' 4"  (1.626 m)   Wt 199 lb (90.3 kg)   SpO2 96%   BMI 34.16 kg/m     Wt Readings from Last 3 Encounters:  06/17/22 199 lb (90.3 kg)  06/16/22 200 lb 0.6 oz (90.7 kg)  06/14/22 197 lb 4 oz (89.5 kg)     GEN:  Well nourished, well developed in no acute distress HEENT: Normal NECK: No JVD; No carotid bruits LYMPHATICS: No lymphadenopathy CARDIAC: RRR, no murmurs, no rubs, no  gallops RESPIRATORY:  Clear to auscultation without rales, wheezing or rhonchi  ABDOMEN: Soft, non-tender, non-distended MUSCULOSKELETAL:  No edema; No deformity  SKIN: Warm and dry LOWER EXTREMITIES: no swelling NEUROLOGIC:  Alert and oriented x 3 PSYCHIATRIC:  Normal affect   ASSESSMENT:    1. Essential hypertension   2. Mixed hyperlipidemia   3. Benign hypertensive renal disease   4. Dyspnea, unspecified type   5. Malignant neoplasm of overlapping sites of stomach [facility])    PLAN:    In order of problems listed above:  Atypical chest pain in this lady with stomach cancer.  I doubt this is related to her heart I will schedule her to have echocardiogram to reassess left ventricle ejection fraction. Essential hypertension blood pressure seems to well-controlled continue present management. Dyslipidemia I did review K PN which show me her LDL 70 HDL 77 this is from June 10, 2022 is a good cholesterol profile we will continue present management   Medication Adjustments/Labs and Tests Ordered: Current medicines are reviewed at length with the patient today.  Concerns regarding medicines are outlined above.  Orders Placed This Encounter  Procedures   EKG 12-Lead   ECHOCARDIOGRAM COMPLETE   Medication changes: No orders of the defined types were placed in this encounter.   Signed, Georgeanna Lea, MD, [facility] 06/17/2022 11:54 AM    [facility]

## 2022-06-17 NOTE — Patient Instructions (Signed)
Medication Instructions:  Your physician recommends that you continue on your current medications as directed. Please refer to the Current Medication list given to you today.  *If you need a refill on your cardiac medications before your next appointment, please call your pharmacy*   Lab Work: NONE If you have labs (blood work) drawn today and your tests are completely normal, you will receive your results only by: MyChart Message (if you have MyChart) OR A paper copy in the mail If you have any lab test that is abnormal or we need to change your treatment, we will call you to review the results.   Testing/Procedures: Your physician has requested that you have an echocardiogram. Echocardiography is a painless test that uses sound waves to create images of your heart. It provides your doctor with information about the size and shape of your heart and how well your heart's chambers and valves are working. This procedure takes approximately one hour. There are no restrictions for this procedure. Please do NOT wear cologne, perfume, aftershave, or lotions (deodorant is allowed). Please arrive 15 minutes prior to your appointment time.    Follow-Up: At [facility], you and your health needs are our priority.  As part of our continuing mission to provide you with exceptional heart care, we have created designated Provider Care Teams.  These Care Teams include your primary Cardiologist (physician) and Advanced Practice Providers (APPs -  Physician Assistants and Nurse Practitioners) who all work together to provide you with the care you need, when you need it.  We recommend signing up for the patient portal called "MyChart".  Sign up information is provided on this After Visit Summary.  MyChart is used to connect with patients for Virtual Visits (Telemedicine).  Patients are able to view lab/test results, encounter notes, upcoming appointments, etc.  Non-urgent messages can be sent to your  provider as well.   To learn more about what you can do with MyChart, go to [email or web address].    Your next appointment:   6 month(s)  Provider:   Robert Krasowski, MD    Other Instructions   

## 2022-06-17 NOTE — Addendum Note (Signed)
Addended by: Domenic Schwab on: 06/17/2022 04:03 PM   Modules accepted: Orders

## 2022-06-17 NOTE — Telephone Encounter (Signed)
-----   Message from Dellia Beckwith, MD sent at 06/11/2022  1:29 PM EDT ----- Regarding: labs

## 2022-06-17 NOTE — Telephone Encounter (Signed)
Patient is also eating banana. Patient is also set up for IV potassium. Will need a script sent in.

## 2022-06-18 ENCOUNTER — Inpatient Hospital Stay: Payer: Medicare Other

## 2022-06-18 VITALS — BP 159/71 | HR 90 | Temp 98.6°F | Resp 14 | Ht 64.0 in | Wt 195.0 lb

## 2022-06-18 DIAGNOSIS — E876 Hypokalemia: Secondary | ICD-10-CM

## 2022-06-18 DIAGNOSIS — Z5111 Encounter for antineoplastic chemotherapy: Secondary | ICD-10-CM | POA: Diagnosis not present

## 2022-06-18 DIAGNOSIS — C169 Malignant neoplasm of stomach, unspecified: Secondary | ICD-10-CM | POA: Diagnosis not present

## 2022-06-18 DIAGNOSIS — Z79899 Other long term (current) drug therapy: Secondary | ICD-10-CM | POA: Diagnosis not present

## 2022-06-18 MED ORDER — SODIUM CHLORIDE 0.9% FLUSH: 10.0000 mL | Freq: Once | INTRAVENOUS | Status: DC | PRN

## 2022-06-18 MED ORDER — POTASSIUM CHLORIDE 10 MEQ/100ML IV SOLN
10.0000 meq | INTRAVENOUS | Status: AC
  Administered 2022-06-18 (×2): 10 meq via INTRAVENOUS
  Filled 2022-06-18 (×2): qty 100

## 2022-06-18 MED ORDER — SODIUM CHLORIDE 0.9 % IV SOLN: Freq: Once | INTRAVENOUS | Status: AC

## 2022-06-18 MED ORDER — HEPARIN SOD (PORK) LOCK FLUSH 100 UNIT/ML IV SOLN: 500.0000 [IU] | Freq: Once | INTRAVENOUS | Status: DC | PRN

## 2022-06-18 NOTE — Patient Instructions (Addendum)
TAKE ORAL POTASSIUM THREE TIMES A DAY AT HOME.  \[id number] Hypokalemia means that the amount of potassium in the blood is lower than normal. Potassium is a mineral (electrolyte) that helps regulate the amount of fluid in the body. It also stimulates muscle tightening (contraction) and helps nerves work properly. Normally, most of the body's potassium is inside cells, and only a very small amount is in the blood. Because the amount in the blood is so small, minor changes to potassium levels in the blood can be life-threatening. What are the causes? This condition may be caused by: Antibiotic medicine. Diarrhea or vomiting. Taking too much of a medicine that helps you have a bowel movement (laxative) can cause diarrhea and lead to hypokalemia. Chronic kidney disease (CKD). Medicines that help the body get rid of excess fluid (diuretics). Eating disorders, such as anorexia or bulimia. Low magnesium levels in the body. Sweating a lot. What are the signs or symptoms? Symptoms of this condition include: Weakness. Constipation. Fatigue. Muscle cramps. Mental confusion. Skipped heartbeats or irregular heartbeat (palpitations). Tingling or numbness. How is this diagnosed? This condition is diagnosed with a blood test. How is this treated? This condition may be treated by: Taking potassium supplements. Adjusting the medicines that you take. Eating more foods that contain a lot of potassium. If your potassium level is very low, you may need to get potassium through an IV and be monitored in the hospital. Follow these instructions at home: Eating and drinking  Eat a healthy diet. A healthy diet includes fresh fruits and vegetables, whole grains, healthy fats, and lean proteins. If told, eat more foods that contain a lot of potassium. These include: Nuts, such as peanuts and pistachios. Seeds, such as sunflower seeds and pumpkin seeds. Peas, lentils, and lima beans. Whole  grain and bran cereals and breads. Fresh fruits and vegetables, such as apricots, avocado, bananas, cantaloupe, kiwi, oranges, tomatoes, asparagus, and potatoes. Juices, such as orange, tomato, and prune. Lean meats, including fish. Milk and milk products, such as yogurt. General instructions Take over-the-counter and prescription medicines only as told by your health care provider. This includes vitamins, natural food products, and supplements. Keep all follow-up visits. This is important. Contact a health care provider if: You have weakness that gets worse. You feel your heart pounding or racing. You vomit. You have diarrhea. You have diabetes and you have trouble keeping your blood sugar in your target range. Get help right away if: You have chest pain. You have shortness of breath. You have vomiting or diarrhea that lasts for more than 2 days. You faint. These symptoms may be an emergency. Get help right away. Call 911. Do not wait to see if the symptoms will go away. Do not drive yourself to the hospital. Summary Hypokalemia means that the amount of potassium in the blood is lower than normal. This condition is diagnosed with a blood test. Hypokalemia may be treated by taking potassium supplements, adjusting the medicines that you take, or eating more foods that are high in potassium. If your potassium level is very low, you may need to get potassium through an IV and be monitored in the hospital. This information is not intended to replace advice given to you by your health care provider. Make sure you discuss any questions you have with your health care provider. Document Revised: 10/16/2020 Document Reviewed: 10/16/2020 Elsevier Patient Education  2023 [employer].

## 2022-06-22 ENCOUNTER — Encounter: Payer: Self-pay | Admitting: Oncology

## 2022-06-24 NOTE — Progress Notes (Signed)
[facility]  [address] [place],  [place]  [address] [phone]  Clinic Day: 06/25/22  Referring physician: Philemon Kingdom, MD  ASSESSMENT & PLAN:  Assessment: Gastric adenocarcinoma This is a poorly differentiated adenocarcinoma with signet ring features and ulceration. Stain for HER2 was negative at 0. I would consider it as metastatic to the omentum and so that would stage this as a T4 N0 M1, stage IVB.  She has been on treatment with chemotherapy with FOLFOX and nivolumab for 10 cycles with very good response, but then had increasing dermatologic toxicities and neuropathy. Her PET scan from 03/22/2022 shows a new area of hypermetabolic activity in the right lower lobe of peripheral consolidation, which is likely atelectasis or resolving pneumonia measuring 3cm. The thickening of her stomach had decreased with decreased metabolic activity and there is shrinkage of the omental metastasis from 22 mm to 13 mm. She had severe rash and fever and was admitted to the hospital, but she was also found to have pneumonia and a UTI at that time.  Dr. Jennye Boroughs did a EGD on March, 5th and found persistent poorly differentiated adenocarcinoma and a 3 cm mass of the distal body of the stomach. Since her current treatment is palliating her disease, we will continue.    Immune Mediated Nephritis Her creatinine is improved at 1.6 today and she will receive 1 liter of normal saline tomorrow and was encouraged to drink more fluids. Renal ultrasound was normal. We felt this was caused by her immunotherapy and placed her on Prednisone. I had ordered 10mg  tablets to take 20mg  twice daily but the patient misunderstood and has only been taking 10mg  twice daily. We have steadily tapered the Prednisone dose with good response and I tapered her off completely as of the end of April. Her creatinine went up a little bit last time to 1.27 and now 1.5.    Plan She continues 5FU,  Leucovorin and Nivolumab every 2 weeks without difficulty. Her CBC is normal and her CMP is normal other than her creatinine going up to 1.5 from 1.27 last time. If her kidney function tests continues to worsen I informed her I may put her back on a low dose of steroids. We will try to avoid that by giving her regular IV fluids as she seems to feel quite well with this. Her potassium has improved from 3.2 to 4.6 and I instructed her that she can now decrease to 1 potassium supplement a day. She has a echocardiogram coming up on 07/02/2022. Her day 1 cycle 15 of FOLFOX and Nivolumab is scheduled on 06/28/2022, but we have dropped the oxaliplatin due to peripheral neuropathy. She will receive 1L of IV fluids on Monday and Wednesday with her treatments. Patient did inform me that she does get dizzy when she lays down and that her hearing has decreased some. She sees a dermatologist sometime next week for the rash on her legs that has faded and nearly resolved, but also has a verrucous lesion of the dorsum of her left hand. She will have BMP done before her fluids next Wednesday. If her creatinine is any worse, we will give IV fluids the following week also. [name] back in 2 weeks with CBC and CMP.  I reviewed all of this with her and her daughter and answered their questions. She understands and agrees with this plan.   I provided 15 minutes of face-to-face time during this this encounter and >  50% was spent counseling as documented under my assessment and plan.   Dellia Beckwith, MD [facility] AT [facility] [address] [place] [address] Dept: [phone] Dept Fax: [phone]     CHIEF COMPLAINT:  CC: Gastric cancer  Current Treatment: Chemotherapy/immunotherapy  HISTORY OF PRESENT ILLNESS:  Mackenzie Lewis is a 87 y.o. female with a history of gastric cancer who is referred in consultation with Dr. Shara Blazing for assessment  and management.  She had noticed that she was having regurgitation when eating and had lost over 30 pounds.  An ultrasound was done, revealing hepatic steatosis, and led to an MRI scan on June 5 which revealed gastric wall thickening with confluent nodularity of the omentum anterior to the stomach measuring 3.5 cm consistent with metastatic tumor.  She also had low-grade edema and wall thickening extending into the duodenum from the stomach.  She was referred to Dr. Shara Blazing and he did an EGD on July 7.  This revealed a large ulceration measuring 1.2 cm along the greater curvature.  She also had diffusely edematous and erythematous wall with erosions of the antrum and stiff and friable mucosa with oozing of blood.  These findings extended to the gastric fundus as well.  Pathology revealed a poorly differentiated adenocarcinoma with signet ring features from the biopsies of the ulcer as well as the antrum and the fundus of the stomach.  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  She was placed on omeprazole.  Her test for H. pylori was negative.  She was referred to Dr. Hardin Negus for consideration of surgery but he felt this was not resectable because of the extensive involvement and I agree.  I would consider this extending to the duodenum and also metastatic to the omentum.  PET scan confirmed these findings and she wished to pursue systemic intravenous therapy.  She continues to eat and has adjusted her diet to softer foods and liquids and is drinking boost so she has maintained her weight.  She does have some Zofran ODT for nausea when needed.  She has had some anorexia, nausea, occasional vomiting, early satiety, dysphagia, and belching.  A CEA and CA 19-9 were normal. She has been started on FOLFOX chemotherapy along with immunotherapy.  I have reviewed her chart and materials related to her cancer extensively and collaborated history with the  patient. Summary of oncologic history is as follows: Oncology History  Gastric cancer (HCC)  09/10/2021 Initial Diagnosis   Gastric cancer (HCC)   09/10/2021 Cancer Staging   Staging form: Stomach, AJCC 8th Edition - Clinical stage from 09/10/2021: Stage IVB (cT4b, cN0, cM1) - Signed by Dellia Beckwith, MD on 09/10/2021 Histopathologic type: Adenocarcinoma, NOS Stage prefix: Initial diagnosis Total positive nodes: 0 Histologic grade (G): G3 Histologic grading system: 3 grade system Sites of metastasis: Peritoneal surface Diagnostic confirmation: Positive histology PLUS positive immunophenotyping and/or positive genetic studies Specimen type: Endoscopy with Biopsy Staged by: Managing physician Carcinoembryonic antigen (CEA) (ng/mL): 2.8 Carbohydrate antigen 19-9 (CA 19-9) (U/mL): 4.9 HER2 status: Unknown Microsatellite instability (MSI): Unknown Tumor location in stomach: Other Clinical staging modalities: Biopsy, Endoscopy Stage used in treatment planning: Yes National guidelines used in treatment planning: Yes Type of national guideline used in treatment planning: NCCN   09/28/2021 - 10/14/2021 Chemotherapy   Patient is on Treatment Plan : GASTROESOPHAGEAL FOLFOX + Nivolumab q14d     09/28/2021 -  Chemotherapy  Patient is on Treatment Plan : GASTROESOPHAGEAL FOLFOX + Nivolumab q14d     12/09/2021 Genetic Testing   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  Report date is 12/09/2021.   The Multi-Cancer + RNA Panel offered by [facility] includes sequencing and/or deletion/duplication analysis of the following 84 genes:  AIP*, ALK, APC*, ATM*, AXIN2*, BAP1*, BARD1*, BLM*, BMPR1A*, BRCA1*, BRCA2*, BRIP1*, CASR, CDC73*, CDH1*, CDK4, CDKN1B*, CDKN1C*, CDKN2A, CEBPA, CHEK2*, CTNNA1*, DICER1*, DIS3L2*, EGFR, EPCAM, FH*, FLCN*, GATA2*, GPC3, GREM1, HOXB13, HRAS, KIT, MAX*, MEN1*, MET, MITF, MLH1*, MSH2*, MSH3*, MSH6*, MUTYH*, NBN*, NF1*, NF2*,  NTHL1*, PALB2*, PDGFRA, PHOX2B, PMS2*, POLD1*, POLE*, POT1*, PRKAR1A*, PTCH1*, PTEN*, RAD50*, RAD51C*, RAD51D*, RB1*, RECQL4, RET, RUNX1*, SDHA*, SDHAF2*, SDHB*, SDHC*, SDHD*, SMAD4*, SMARCA4*, SMARCB1*, SMARCE1*, STK11*, SUFU*, TERC, TERT, TMEM127*, Tp53*, TSC1*, TSC2*, VHL*, WRN*, and WT1.  RNA analysis is performed for * genes.     INTERVAL HISTORY:  Mackenzie Lewis is here today for a follow-up for her gastric cancer. She continues 5FU, Leucovorin and Nivolumab every 2 weeks without difficulty. Patient states that she feels very well and has no complaints of pain. She has regained more of her energy back. Her CBC is normal and her CMP is normal other than her creatinine going up to 1.5 from 1.27 last time. If her kidney function tests continues to worsen I informed her I may put her back on a low dose of steroids. We will try to avoid that by giving her regular IV fluids as she seems to feel quite well with this. Patient states that she continues try to drink more water. Her potassium has improved from 3.2 to 4.6 and I instructed her that she can now decrease to 1 potassium supplement a day. She has a echocardiogram coming up on 07/02/2022. Her day 1 cycle 15 of FOLFOX and Nivolumab is scheduled on 06/28/2022, but the oxaliplatin was dropped last month due to peripheral neuropathy, and her day 3 pump D/C, cycle 15 is scheduled on 06/29/2022. She will receive 1L of IV fluids on Monday and Wednesday with her treatments. Patient did inform me that she does get dizzy when she lays down and that her hearing has decreased some. She sees a dermatologist sometime next week for the rash on her legs that have has faded and nearly resolved, but she also has a verrucous lesion of her left hand. She will have BMP done before her fluids next Wednesday. If her creatinine is any worse, we will give IV fluids the following week also. [name] back in 2 weeks with CBC and CMP. She denies signs of infection such as sore throat,  sinus drainage, cough, or urinary symptoms.  She denies fevers or recurrent chills. She denies pain. She denies nausea, vomiting, chest pain, dyspnea or cough. Her appetite is very well and her weight has decreased 2 pounds over last week . She is accompanied at today's visit by her daughter.   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  If that happens, she is to call us so that we can schedule her for an ultrasound to look for synovitis.     Essential hypertension 09/09/2016   Gastric cancer (HCC) 09/10/2021   Hyperlipidemia 09/01/2016   Hypertension    Hypothyroidism 09/01/2016   Osteoarthritis of both feet 01/20/2016   Osteoarthritis, hand 01/20/2016   Thyroid disease   Degenerative disc disease History of endometriosis  Past Surgical History:  Procedure Laterality Date   APPENDECTOMY     LAPAROSCOPIC APPENDECTOMY N/A 04/10/2016   Procedure: APPENDECTOMY LAPAROSCOPIC;  Surgeon: Abigail Miyamoto, MD;  Location: [facility] OR;  Service: General;  Laterality: N/A;  Bilateral tubal ligation Total hysterectomy and bilateral salpingo-oophorectomy in 1980  Family History  Problem Relation Age of Onset   Hypertension Mother    Prostate cancer Father        metastatic; d. 29   Brain cancer Sister 36   Breast cancer Sister 21   AAA (abdominal aortic aneurysm) Brother    Leukemia Cousin        x2 maternal female cousins; d. before 74   Breast cancer Daughter 39        DCIS  Her sister has had breast cancer as well as a tumor of her head Her daughter has had breast cancer last year  Social History:  reports that she has never smoked. She has never used smokeless tobacco. She reports that she does not currently use alcohol. She reports that she does not use drugs.The patient is accompanied by her son, daughter-in-law and daughter today.  She is single but lives with a significant other.  She has the 2 children.  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  She grew up in [place] and [place].  She is active and healthy, especially for her age.  Allergies:  Allergies  Allergen Reactions   Doxycycline Rash   Sulfa Antibiotics Rash and Other (See Comments)    Other reaction(s): Other (See Comments)  "Made me feel weird"    Current Medications: Current Outpatient Medications  Medication Sig Dispense Refill   aspirin 81 MG EC tablet Take 81 mg by mouth daily. Swallow whole.     b complex vitamins capsule Take 1 capsule by mouth daily.     Calcium Carbonate (CALCIUM 600 PO) Take 1 tablet by mouth daily.     Cholecalciferol (VITAMIN D3) 5000 units CAPS Take 1 capsule by mouth daily.     EXFORGE HCT 5-160-12.5 MG TABS Take 1 tablet by mouth daily.     famotidine (PEPCID) 40 MG tablet Take 40 mg by mouth at bedtime.     KRILL OIL PO Take 1 capsule by mouth daily. Unknown strenght     Lactobacillus TABS Take 1 tablet by mouth 2 (two) times daily.     levothyroxine (SYNTHROID, LEVOTHROID) 75 MCG tablet Take 75 mcg by mouth daily before breakfast.      magic mouthwash SOLN SWISH AND SWALLOW BY MOUTH EVERY THREE TO FOUR HOURS     NON FORMULARY MMW: 3 parts Maalox 2 parts Benadryl 1 part viscious lidicaine  Disp. 6oz  Instructions: 5ml swish and swallow every 3-4 hours     omeprazole (PRILOSEC) 40 MG capsule Take 1 capsule (40 mg total) by mouth 2 (two) times daily. 60 capsule 5   ondansetron (ZOFRAN-ODT) 4 MG disintegrating tablet  Take 1 tablet (4 mg total) by mouth every 8 (eight) hours as needed for nausea or  vomiting. 90 tablet 0   polyethylene glycol powder (GLYCOLAX/MIRALAX) 17 GM/SCOOP powder SMARTSIG:1 scoopful By Mouth Daily     potassium chloride SA (KLOR-CON M) 20 MEQ tablet Take 1 tablet (20 mEq total) by mouth 3 (three) times daily. (Patient taking differently: Take 20 mEq by mouth daily.) 90 tablet 5   pravastatin (PRAVACHOL) 20 MG tablet Take 1 tablet (20 mg total) by mouth every evening. 90 tablet 3   predniSONE (DELTASONE) 10 MG tablet Take 2 tablets (20 mg total) by mouth 2 (two) times daily with a meal. (Patient taking differently: Take 10 mg by mouth 2 (two) times daily with a meal. One 10mg  tablet twice a day) 100 tablet 1   Probiotic Product (PROBIOTIC DAILY PO) Take 1 tablet by mouth daily.     prochlorperazine (COMPAZINE) 10 MG tablet Take 1 tablet (10 mg total) by mouth every 6 (six) hours as needed for nausea or vomiting. 90 tablet 3   triamcinolone 0.1% oint-Eucerin equivalent cream 1:1 mixture Apply topically 2 (two) times daily. 160 g 0   triamcinolone cream (KENALOG) 0.1 % Apply to affected skin twice daily (Patient taking differently: daily as needed. Apply to affected skin twice daily) 160 g 0   No current facility-administered medications for this visit.   REVIEW OF SYSTEMS:  Review of Systems  Constitutional: Negative.  Negative for appetite change, chills, diaphoresis, fatigue, fever and unexpected weight change.  HENT:   Negative for hearing loss, lump/mass, mouth sores, nosebleeds, sore throat, tinnitus, trouble swallowing and voice change.   Eyes:  Negative for eye problems (watery eyes) and icterus.  Respiratory: Negative.  Negative for chest tightness, cough, hemoptysis, shortness of breath and wheezing.   Cardiovascular: Negative.  Negative for chest pain, leg swelling and palpitations.  Gastrointestinal: Negative.  Negative for abdominal distention, abdominal pain, blood in stool,  constipation, diarrhea, nausea, rectal pain and vomiting.  Endocrine: Negative.  Negative for hot flashes.  Genitourinary: Negative.  Negative for bladder incontinence, difficulty urinating, dyspareunia, dysuria, frequency, hematuria, menstrual problem, nocturia, pelvic pain, vaginal bleeding and vaginal discharge.   Musculoskeletal: Negative.  Negative for arthralgias, back pain, flank pain, gait problem, myalgias, neck pain and neck stiffness.  Skin: Negative.  Negative for itching, rash and wound.       Occasional blotchy discoloration of her lower extremities, but much improved  Neurological:  Positive for numbness (in her fingers and toes). Negative for dizziness, extremity weakness, gait problem, headaches, light-headedness, seizures and speech difficulty.  Hematological:  Negative for adenopathy. Bruises/bleeds easily.  Psychiatric/Behavioral: Negative.  Negative for confusion, decreased concentration, depression, sleep disturbance and suicidal ideas. The patient is not nervous/anxious.    VITALS:  Blood pressure (!) 140/70, pulse 88, temperature 98.2 F (36.8 C), temperature source Oral, resp. rate 18, height 5\' 4"  (1.626 m), weight 197 lb 1.6 oz (89.4 kg), SpO2 98 %.  Wt Readings from Last 3 Encounters:  07/08/22 198 lb 12.8 oz (90.2 kg)  06/30/22 202 lb (91.6 kg)  06/28/22 199 lb 1.3 oz (90.3 kg)    Body mass index is 33.83 kg/m.  Performance status (ECOG): 1 - Symptomatic but completely ambulatory  PHYSICAL EXAM:  Physical Exam Vitals and nursing note reviewed. Exam conducted with a chaperone present.  Constitutional:      General: She is not in acute distress.    Appearance: Normal appearance. She is normal weight. She is not ill-appearing, toxic-appearing or diaphoretic.  HENT:     Head: Normocephalic and atraumatic.  Right Ear: Tympanic membrane, ear canal and external ear normal. There is no impacted cerumen.     Left Ear: Tympanic membrane, ear canal and external  ear normal. There is no impacted cerumen.     Nose: Nose normal. No congestion or rhinorrhea.     Mouth/Throat:     Mouth: Mucous membranes are moist.     Pharynx: Oropharynx is clear. No oropharyngeal exudate or posterior oropharyngeal erythema.  Eyes:     General: No scleral icterus.       Right eye: No discharge.        Left eye: No discharge.     Extraocular Movements: Extraocular movements intact.     Conjunctiva/sclera: Conjunctivae normal.     Pupils: Pupils are equal, round, and reactive to light.  Neck:     Vascular: No carotid bruit.  Cardiovascular:     Rate and Rhythm: Normal rate and regular rhythm.     Pulses: Normal pulses.     Heart sounds: Normal heart sounds. No murmur heard.    No friction rub. No gallop.  Pulmonary:     Effort: Pulmonary effort is normal. No respiratory distress.     Breath sounds: Normal breath sounds. No stridor. No wheezing, rhonchi or rales.  Chest:     Chest wall: No tenderness.  Abdominal:     General: Bowel sounds are normal. There is no distension.     Palpations: Abdomen is soft. There is no hepatomegaly, splenomegaly or mass.     Tenderness: There is no abdominal tenderness. There is no right CVA tenderness, left CVA tenderness, guarding or rebound.     Hernia: No hernia is present.     Comments: Mild firmness deep in the epigastrium.   Musculoskeletal:        General: No swelling, tenderness, deformity or signs of injury. Normal range of motion.     Cervical back: Normal range of motion and neck supple. No rigidity or tenderness.     Right lower leg: No edema.     Left lower leg: No edema.  Lymphadenopathy:     Cervical: No cervical adenopathy.     Right cervical: No superficial, deep or posterior cervical adenopathy.    Left cervical: No superficial, deep or posterior cervical adenopathy.     Upper Body:     Right upper body: No supraclavicular, axillary or pectoral adenopathy.     Left upper body: No supraclavicular, axillary  or pectoral adenopathy.  Skin:    General: Skin is warm and dry.     Coloration: Skin is not jaundiced or pale.     Findings: No bruising, erythema, lesion or rash.     Comments: Rash on her legs is faded and nearly resolved    Neurological:     General: No focal deficit present.     Mental Status: She is alert and oriented to person, place, and time. Mental status is at baseline.     Cranial Nerves: No cranial nerve deficit.     Sensory: No sensory deficit.     Motor: No weakness.     Coordination: Coordination normal.     Gait: Gait normal.     Deep Tendon Reflexes: Reflexes normal.  Psychiatric:        Mood and Affect: Mood normal.        Behavior: Behavior normal.        Thought Content: Thought content normal.        Judgment: Judgment normal.  LABS:      Latest Ref Rng & Units 07/08/2022   12:00 AM 06/25/2022   12:00 AM 06/10/2022   12:00 AM  CBC  WBC  6.6     6.6     7.7      Hemoglobin 12.0 - 16.0 11.7     12.1     11.6      Hematocrit 36 - 46 33     35     33      Platelets 150 - 400 K/uL 203     217     231         This result is from an external source.      Latest Ref Rng & Units 07/08/2022   12:00 AM 06/30/2022   12:00 AM 06/25/2022   12:00 AM  CMP  BUN 4 - 21 19     26     20       Creatinine 0.5 - 1.1 1.3     1.2     1.5      Sodium 137 - 147 138     139     139      Potassium 3.5 - 5.1 mEq/L 3.8     3.8     4.6      Chloride 99 - 108 108     107     106      CO2 13 - 22 24     22     24       Calcium 8.7 - 10.7 9.2     9.5     9.9      Alkaline Phos 25 - 125 69      63      AST 13 - 35 24      30      ALT 7 - 35 U/L 16      19         This result is from an external source.   Lab Results  Component Value Date   CEA1 2.9 09/10/2021   /  CEA  Date Value Ref Range Status  09/10/2021 2.9 0.0 - 4.7 ng/mL Final    Comment:    (NOTE)                             Nonsmokers          <3.9                             Smokers             <5.6 Roche  Diagnostics Electrochemiluminescence Immunoassay (ECLIA) Values obtained with different assay methods or kits cannot be used interchangeably.  Results cannot be interpreted as absolute evidence of the presence or absence of malignant disease. Performed At: [facility] [address] [place], [place] [id number] Jolene Schimke MD HK:[id number]    Component Ref Range & Units 2 wk ago (06/10/22) 4 wk ago (05/27/22) 1 mo ago (04/29/22) 2 mo ago (04/15/22) 2 mo ago (04/07/22) 3 mo ago (03/24/22) 4 mo ago (02/16/22)  T4, Total 4.5 - 12.0 ug/dL 56.4 9.6 CM 7.6 CM 33.2 CM 10.1 CM 10.5 CM 12.5 High  CM     Component Ref Range & Units 2 wk ago (06/10/22) 4 wk ago (05/27/22) 1 mo ago (04/29/22) 2 mo ago (04/15/22)  2 mo ago (04/07/22) 3 mo ago (03/24/22) 4 mo ago (02/16/22)  TSH 0.350 - 4.500 uIU/mL 2.595 0.990 CM 0.410 CM 0.892 CM 1.895 CM 1.835 CM 2.087 CM   Component Ref Range & Units 06/10/22  Vitamin B-12 180 - 914 pg/mL 302   Component Ref Range & Units 06/10/22 2 yr ago 3 yr ago  Cholesterol 0 - 200 mg/dL 161    Triglycerides <096 mg/dL 045 409 R 811 High  R  HDL >40 mg/dL 77 64 R 62 R  Total CHOL/HDL Ratio RATIO 2.3 2.8 R, CM 3.3 R, CM  VLDL 0 - 40 mg/dL 29    LDL Cholesterol 0 - 99 mg/dL 70      No results found for: "PSA1" No results found for: "BJY782" No results found for: "CAN125"  No results found for: "TOTALPROTELP", "ALBUMINELP", "A1GS", "A2GS", "BETS", "BETA2SER", "GAMS", "MSPIKE", "SPEI" No results found for: "TIBC", "FERRITIN", "IRONPCTSAT" No results found for: "LDH"  STUDIES:  ECHOCARDIOGRAM COMPLETE  Result Date: 07/02/2022    ECHOCARDIOGRAM REPORT   Patient Name:   Mackenzie Lewis Date of Exam: 07/02/2022 Medical Rec #:  [id number]      Height:       64.0 in Accession #:    [id number]     Weight:       202.0 lb Date of Birth:  05-22-1935      BSA:          1.965 m Patient Age:    87 years       BP:           136/58 mmHg Patient Gender: F              HR:            83 bpm. Exam Location:  [facility] Procedure: 2D Echo, Cardiac Doppler, Color Doppler and Strain Analysis Indications:    Essential hypertension [I10 (ICD-10-CM)]; Mixed hyperlipidemia                 [E78.2 (ICD-10-CM)]; Benign hypertensive renal disease [I12.9                 (ICD-10-CM)]; Dyspnea, unspecified type [R06.00 (ICD-10-CM)]  History:        Patient has prior history of Echocardiogram examinations, most                 recent 12/24/2020. Signs/Symptoms:Dyspnea and Chest Pain; Risk                 Factors:Dyslipidemia and Hypertension.  Sonographer:    Margreta Journey RDCS Referring Phys: [id number] ROBERT J KRASOWSKI IMPRESSIONS  1. Left ventricular ejection fraction, by estimation, is 60 to 65%. The left ventricle has normal function. The left ventricle has no regional wall motion abnormalities. Left ventricular diastolic parameters are consistent with Grade I diastolic dysfunction (impaired relaxation).GLS-18.3%  2. Right ventricular systolic function is normal. The right ventricular size is normal. There is normal pulmonary artery systolic pressure.  3. Left atrial size was mildly dilated.  4. The mitral valve is normal in structure. Moderate mitral valve regurgitation. No evidence of mitral stenosis.  5. The aortic valve is normal in structure. Aortic valve regurgitation is not visualized. No aortic stenosis is present.  6. The inferior vena cava is normal in size with greater than 50% respiratory variability, suggesting right atrial pressure of 3 mmHg. FINDINGS  Left Ventricle: Left ventricular ejection fraction, by estimation, is 60 to 65%. The left ventricle has normal  function. The left ventricle has no regional wall motion abnormalities. The left ventricular internal cavity size was normal in size. There is  no left ventricular hypertrophy. Left ventricular diastolic parameters are consistent with Grade I diastolic dysfunction (impaired relaxation). Right Ventricle: The right ventricular  size is normal. No increase in right ventricular wall thickness. Right ventricular systolic function is normal. There is normal pulmonary artery systolic pressure. The tricuspid regurgitant velocity is 2.30 m/s, and  with an assumed right atrial pressure of 3 mmHg, the estimated right ventricular systolic pressure is 24.2 mmHg. Left Atrium: Left atrial size was mildly dilated. Right Atrium: Right atrial size was normal in size. Pericardium: There is no evidence of pericardial effusion. Mitral Valve: The mitral valve is normal in structure. Moderate mitral valve regurgitation. No evidence of mitral valve stenosis. Tricuspid Valve: The tricuspid valve is normal in structure. Tricuspid valve regurgitation is not demonstrated. No evidence of tricuspid stenosis. Aortic Valve: The aortic valve is normal in structure. Aortic valve regurgitation is not visualized. No aortic stenosis is present. Aortic valve mean gradient measures 8.7 mmHg. Aortic valve peak gradient measures 15.3 mmHg. Aortic valve area, by VTI measures 1.77 cm. Pulmonic Valve: The pulmonic valve was normal in structure. Pulmonic valve regurgitation is not visualized. No evidence of pulmonic stenosis. Aorta: The aortic root is normal in size and structure. Venous: The inferior vena cava is normal in size with greater than 50% respiratory variability, suggesting right atrial pressure of 3 mmHg. IAS/Shunts: No atrial level shunt detected by color flow Doppler.  LEFT VENTRICLE PLAX 2D LVIDd:         4.30 cm   Diastology LVIDs:         2.60 cm   LV e' medial:    5.66 cm/s LV PW:         1.20 cm   LV E/e' medial:  16.9 LV IVS:        1.40 cm   LV e' lateral:   5.87 cm/s LVOT diam:     2.00 cm   LV E/e' lateral: 16.3 LV SV:         68 LV SV Index:   34 LVOT Area:     3.14 cm  RIGHT VENTRICLE             IVC RV Basal diam:  2.50 cm     IVC diam: 1.80 cm RV Mid diam:    2.50 cm RV S prime:     11.60 cm/s TAPSE (M-mode): 2.4 cm LEFT ATRIUM             Index         RIGHT ATRIUM           Index LA diam:        4.00 cm 2.04 cm/m   RA Area:     15.90 cm LA Vol (A2C):   46.6 ml 23.72 ml/m  RA Volume:   35.40 ml  18.02 ml/m LA Vol (A4C):   46.5 ml 23.67 ml/m LA Biplane Vol: 48.7 ml 24.79 ml/m  AORTIC VALVE AV Area (Vmax):    1.75 cm AV Area (Vmean):   1.77 cm AV Area (VTI):     1.77 cm AV Vmax:           195.67 cm/s AV Vmean:          135.333 cm/s AV VTI:            0.382 m AV Peak Grad:  15.3 mmHg AV Mean Grad:      8.7 mmHg LVOT Vmax:         109.00 cm/s LVOT Vmean:        76.250 cm/s LVOT VTI:          0.215 m LVOT/AV VTI ratio: 0.56  AORTA Ao Root diam: 3.30 cm Ao Asc diam:  3.70 cm Ao Desc diam: 1.70 cm MITRAL VALVE                  TRICUSPID VALVE MV Area (PHT): 4.41 cm       TR Peak grad:   21.2 mmHg MV Decel Time: 172 msec       TR Vmax:        230.00 cm/s MR Peak grad:    156.8 mmHg MR Mean grad:    101.0 mmHg   SHUNTS MR Vmax:         626.00 cm/s  Systemic VTI:  0.22 m MR Vmean:        478.0 cm/s   Systemic Diam: 2.00 cm MR PISA:         1.01 cm MR PISA Eff ROA: 6 mm MR PISA Radius:  0.40 cm MV E velocity: 95.40 cm/s MV A velocity: 144.00 cm/s MV E/A ratio:  0.66 Belva Crome MD Electronically signed by Belva Crome MD Signature Date/Time: 07/02/2022/4:57:33 PM    Final      EXAM: 03/22/2022 NUCLEAR MEDICINE PET SKULL BASE TO THIGH IMPRESSION: 1. Resolution of metabolic activity associated with the stomach. 2. Decrease in size of omental nodularity. No associated metabolic activity. 3. No evidence of new or progressive gastric carcinoma. 4. New peripheral consolidation in the RIGHT lower lobe with moderate metabolic activity. Favor focus of atelectasis versus less likely pneumonia. 5.  Aortic Atherosclerosis (ICD10-I70.0).  EXAM:12/16/21 CT CHEST, ABDOMEN, AND PELVIS WITH CONTRAST IMPRESSION: Response to therapy of gastric primary and adjacent omental nodularity. No new or progressive disease. Right larger than left lower lobe  pulmonary nodules, felt to be similar to the 04/10/2016 remote CT and therefore benign. Progressive interstitial lung disease, right greater than left. This could be postinfectious/inflammatory or less likely represent nonspecific interstitial pneumonitis. Improvement in common duct dilation, likely incidental given chronicity.    I,Jasmine M Lassiter,acting as a scribe for Dellia Beckwith, MD.,have documented all relevant documentation on the behalf of Dellia Beckwith, MD,as directed by  Dellia Beckwith, MD while in the presence of Dellia Beckwith, MD.

## 2022-06-25 ENCOUNTER — Inpatient Hospital Stay: Payer: Medicare Other | Admitting: Oncology

## 2022-06-25 ENCOUNTER — Other Ambulatory Visit: Payer: Self-pay | Admitting: Oncology

## 2022-06-25 ENCOUNTER — Encounter: Payer: Self-pay | Admitting: Oncology

## 2022-06-25 ENCOUNTER — Inpatient Hospital Stay: Payer: Medicare Other

## 2022-06-25 VITALS — BP 140/70 | HR 88 | Temp 98.2°F | Resp 18 | Ht 64.0 in | Wt 197.1 lb

## 2022-06-25 DIAGNOSIS — N142 Nephropathy induced by unspecified drug, medicament or biological substance: Secondary | ICD-10-CM

## 2022-06-25 DIAGNOSIS — D649 Anemia, unspecified: Secondary | ICD-10-CM | POA: Diagnosis not present

## 2022-06-25 DIAGNOSIS — C168 Malignant neoplasm of overlapping sites of stomach: Secondary | ICD-10-CM

## 2022-06-25 DIAGNOSIS — Z5111 Encounter for antineoplastic chemotherapy: Secondary | ICD-10-CM | POA: Diagnosis not present

## 2022-06-25 DIAGNOSIS — C169 Malignant neoplasm of stomach, unspecified: Secondary | ICD-10-CM | POA: Diagnosis not present

## 2022-06-25 DIAGNOSIS — Z79899 Other long term (current) drug therapy: Secondary | ICD-10-CM | POA: Diagnosis not present

## 2022-06-25 LAB — BASIC METABOLIC PANEL
BUN: 20 (ref 4–21)
CO2: 24 — AB (ref 13–22)
Chloride: 106 (ref 99–108)
Creatinine: 1.5 — AB (ref 0.5–1.1)
EGFR: 33
Glucose: 127
Potassium: 4.6 mEq/L (ref 3.5–5.1)
Sodium: 139 (ref 137–147)

## 2022-06-25 LAB — HEPATIC FUNCTION PANEL
ALT: 19 U/L (ref 7–35)
AST: 30 (ref 13–35)
Alkaline Phosphatase: 63 (ref 25–125)
Bilirubin, Total: 0.6

## 2022-06-25 LAB — CBC AND DIFFERENTIAL
HCT: 35 — AB (ref 36–46)
Hemoglobin: 12.1 (ref 12.0–16.0)
Neutrophils Absolute: 3.1
Platelets: 217 10*3/uL (ref 150–400)
WBC: 6.6

## 2022-06-25 LAB — TSH: TSH: 2.455 u[IU]/mL (ref 0.350–4.500)

## 2022-06-25 LAB — COMPREHENSIVE METABOLIC PANEL
Albumin: 4.3 (ref 3.5–5.0)
Calcium: 9.9 (ref 8.7–10.7)

## 2022-06-25 LAB — CBC: RBC: 3.83 — AB (ref 3.87–5.11)

## 2022-06-25 MED FILL — Leucovorin Calcium For Inj 350 MG: INTRAMUSCULAR | Qty: 31.5 | Status: AC

## 2022-06-25 MED FILL — Fluorouracil IV Soln 2.5 GM/50ML (50 MG/ML): INTRAVENOUS | Qty: 13 | Status: AC

## 2022-06-25 MED FILL — Dexamethasone Sodium Phosphate Inj 100 MG/10ML: INTRAMUSCULAR | Qty: 1 | Status: AC

## 2022-06-25 MED FILL — Fluorouracil IV Soln 5 GM/100ML (50 MG/ML): INTRAVENOUS | Qty: 70 | Status: AC

## 2022-06-25 MED FILL — Nivolumab IV Soln 100 MG/10ML: INTRAVENOUS | Qty: 24 | Status: AC

## 2022-06-26 LAB — T4: T4, Total: 10.8 ug/dL (ref 4.5–12.0)

## 2022-06-28 ENCOUNTER — Ambulatory Visit: Payer: TRICARE For Life (TFL)

## 2022-06-28 ENCOUNTER — Inpatient Hospital Stay: Payer: Medicare Other

## 2022-06-28 VITALS — BP 149/66 | HR 93 | Temp 97.7°F | Resp 14 | Ht 64.0 in | Wt 199.1 lb

## 2022-06-28 DIAGNOSIS — Z79899 Other long term (current) drug therapy: Secondary | ICD-10-CM | POA: Diagnosis not present

## 2022-06-28 DIAGNOSIS — C168 Malignant neoplasm of overlapping sites of stomach: Secondary | ICD-10-CM

## 2022-06-28 DIAGNOSIS — Z5111 Encounter for antineoplastic chemotherapy: Secondary | ICD-10-CM | POA: Diagnosis not present

## 2022-06-28 DIAGNOSIS — C169 Malignant neoplasm of stomach, unspecified: Secondary | ICD-10-CM | POA: Diagnosis not present

## 2022-06-28 MED ORDER — LEUCOVORIN CALCIUM INJECTION 350 MG
320.0000 mg/m2 | Freq: Once | INTRAVENOUS | Status: AC
  Administered 2022-06-28: 630 mg via INTRAVENOUS
  Filled 2022-06-28: qty 31.5

## 2022-06-28 MED ORDER — SODIUM CHLORIDE 0.9 % IV SOLN: Freq: Once | INTRAVENOUS | Status: AC

## 2022-06-28 MED ORDER — SODIUM CHLORIDE 0.9 % IV SOLN
10.0000 mg | Freq: Once | INTRAVENOUS | Status: AC
  Administered 2022-06-28: 10 mg via INTRAVENOUS
  Filled 2022-06-28: qty 10

## 2022-06-28 MED ORDER — SODIUM CHLORIDE 0.9 % IV SOLN
1920.0000 mg/m2 | INTRAVENOUS | Status: DC
  Administered 2022-06-28: 3500 mg via INTRAVENOUS
  Filled 2022-06-28: qty 70

## 2022-06-28 MED ORDER — PALONOSETRON HCL INJECTION 0.25 MG/5ML
0.2500 mg | Freq: Once | INTRAVENOUS | Status: AC
  Administered 2022-06-28: 0.25 mg via INTRAVENOUS
  Filled 2022-06-28: qty 5

## 2022-06-28 MED ORDER — SODIUM CHLORIDE 0.9 % IV SOLN
240.0000 mg | Freq: Once | INTRAVENOUS | Status: AC
  Administered 2022-06-28: 240 mg via INTRAVENOUS
  Filled 2022-06-28: qty 24

## 2022-06-28 MED ORDER — FLUOROURACIL CHEMO INJECTION 2.5 GM/50ML
320.0000 mg/m2 | Freq: Once | INTRAVENOUS | Status: AC
  Administered 2022-06-28: 650 mg via INTRAVENOUS
  Filled 2022-06-28: qty 13

## 2022-06-28 MED ORDER — DEXTROSE 5 % IV SOLN: Freq: Once | INTRAVENOUS | Status: AC

## 2022-06-28 NOTE — Patient Instructions (Signed)
[facility]  Discharge Instructions: Thank you for choosing [facility] to provide your oncology and hematology care.  If you have a lab appointment with the Cancer Center, please go directly to the Cancer Center and check in at the registration area.   Wear comfortable clothing and clothing appropriate for easy access to any Portacath or PICC line.   We strive to give you quality time with your provider. You may need to reschedule your appointment if you arrive late (15 or more minutes).  Arriving late affects you and other patients whose appointments are after yours.  Also, if you miss three or more appointments without notifying the office, you may be dismissed from the clinic at the provider's discretion.      For prescription refill requests, have your pharmacy contact our office and allow 72 hours for refills to be completed.    Today you received the following chemotherapy and/or immunotherapy agents: Opdivo Leucovorin, 69fu      To help prevent nausea and vomiting after your treatment, we encourage you to take your nausea medication as directed.  BELOW ARE SYMPTOMS THAT SHOULD BE REPORTED IMMEDIATELY: *FEVER GREATER THAN 100.4 F (38 C) OR HIGHER *CHILLS OR SWEATING *NAUSEA AND VOMITING THAT IS NOT CONTROLLED WITH YOUR NAUSEA MEDICATION *UNUSUAL SHORTNESS OF BREATH *UNUSUAL BRUISING OR BLEEDING *URINARY PROBLEMS (pain or burning when urinating, or frequent urination) *BOWEL PROBLEMS (unusual diarrhea, constipation, pain near the anus) TENDERNESS IN MOUTH AND THROAT WITH OR WITHOUT PRESENCE OF ULCERS (sore throat, sores in mouth, or a toothache) UNUSUAL RASH, SWELLING OR PAIN  UNUSUAL VAGINAL DISCHARGE OR ITCHING   Items with * indicate a potential emergency and should be followed up as soon as possible or go to the Emergency Department if any problems should occur.  Please show the CHEMOTHERAPY ALERT CARD or IMMUNOTHERAPY ALERT CARD at  check-in to the Emergency Department and triage nurse.  Should you have questions after your visit or need to cancel or reschedule your appointment, please contact [facility]  Dept: [phone]  and follow the prompts.  Office hours are 8:00 a.m. to 4:30 p.m. Monday - Friday. Please note that voicemails left after 4:00 p.m. may not be returned until the following business day.  We are closed weekends and major holidays. You have access to a nurse at all times for urgent questions. Please call the main number to the clinic Dept: [phone] and follow the prompts.  For any non-urgent questions, you may also contact your provider using MyChart. We now offer e-Visits for anyone 79 and older to request care online for non-urgent symptoms. For details visit [email or web address].   Also download the MyChart app! Go to the app store, search "MyChart", open the app, select , and log in with your MyChart username and password.

## 2022-06-30 ENCOUNTER — Inpatient Hospital Stay: Payer: Medicare Other

## 2022-06-30 VITALS — BP 136/58 | HR 81 | Temp 98.4°F | Resp 16 | Ht 64.0 in | Wt 202.0 lb

## 2022-06-30 DIAGNOSIS — C168 Malignant neoplasm of overlapping sites of stomach: Secondary | ICD-10-CM

## 2022-06-30 DIAGNOSIS — L820 Inflamed seborrheic keratosis: Secondary | ICD-10-CM | POA: Diagnosis not present

## 2022-06-30 DIAGNOSIS — Z5111 Encounter for antineoplastic chemotherapy: Secondary | ICD-10-CM | POA: Diagnosis not present

## 2022-06-30 DIAGNOSIS — L814 Other melanin hyperpigmentation: Secondary | ICD-10-CM | POA: Diagnosis not present

## 2022-06-30 DIAGNOSIS — N142 Nephropathy induced by unspecified drug, medicament or biological substance: Secondary | ICD-10-CM | POA: Diagnosis not present

## 2022-06-30 DIAGNOSIS — C169 Malignant neoplasm of stomach, unspecified: Secondary | ICD-10-CM | POA: Diagnosis not present

## 2022-06-30 DIAGNOSIS — Z79899 Other long term (current) drug therapy: Secondary | ICD-10-CM | POA: Diagnosis not present

## 2022-06-30 LAB — BASIC METABOLIC PANEL
BUN: 26 — AB (ref 4–21)
CO2: 22 (ref 13–22)
Chloride: 107 (ref 99–108)
Creatinine: 1.2 — AB (ref 0.5–1.1)
EGFR: 43
Glucose: 104
Potassium: 3.8 mEq/L (ref 3.5–5.1)
Sodium: 139 (ref 137–147)

## 2022-06-30 LAB — COMPREHENSIVE METABOLIC PANEL: Calcium: 9.5 (ref 8.7–10.7)

## 2022-06-30 MED ORDER — SODIUM CHLORIDE 0.9 % IV SOLN: Freq: Once | INTRAVENOUS | Status: AC

## 2022-06-30 MED ORDER — SODIUM CHLORIDE 0.9% FLUSH
10.0000 mL | INTRAVENOUS | Status: DC | PRN
  Administered 2022-06-30: 10 mL

## 2022-06-30 MED ORDER — HEPARIN SOD (PORK) LOCK FLUSH 100 UNIT/ML IV SOLN
500.0000 [IU] | Freq: Once | INTRAVENOUS | Status: AC | PRN
  Administered 2022-06-30: 500 [IU]

## 2022-06-30 NOTE — Patient Instructions (Signed)

## 2022-06-30 NOTE — Patient Instructions (Signed)

## 2022-07-01 ENCOUNTER — Ambulatory Visit: Payer: TRICARE For Life (TFL)

## 2022-07-02 ENCOUNTER — Ambulatory Visit: Payer: Medicare Other | Attending: Cardiology

## 2022-07-02 DIAGNOSIS — I10 Essential (primary) hypertension: Secondary | ICD-10-CM | POA: Insufficient documentation

## 2022-07-02 DIAGNOSIS — I129 Hypertensive chronic kidney disease with stage 1 through stage 4 chronic kidney disease, or unspecified chronic kidney disease: Secondary | ICD-10-CM | POA: Insufficient documentation

## 2022-07-02 DIAGNOSIS — R0600 Dyspnea, unspecified: Secondary | ICD-10-CM | POA: Diagnosis not present

## 2022-07-02 DIAGNOSIS — E782 Mixed hyperlipidemia: Secondary | ICD-10-CM | POA: Diagnosis not present

## 2022-07-02 LAB — ECHOCARDIOGRAM COMPLETE
AR max vel: 1.75 cm2
AV Area VTI: 1.77 cm2
AV Area mean vel: 1.77 cm2
AV Mean grad: 8.7 mmHg
AV Peak grad: 15.3 mmHg
Ao pk vel: 1.96 m/s
Area-P 1/2: 4.41 cm2
MV M vel: 6.26 m/s
MV Peak grad: 156.8 mmHg
Radius: 0.4 cm
S' Lateral: 2.6 cm

## 2022-07-05 ENCOUNTER — Telehealth: Payer: Self-pay

## 2022-07-05 ENCOUNTER — Encounter: Payer: Self-pay | Admitting: Oncology

## 2022-07-05 NOTE — Telephone Encounter (Signed)
-----   Message from Jeannette Corpus, LPN sent at 1/61/0960  9:36 AM EDT ----- Regarding: FW: call  ----- Message ----- From: Dellia Beckwith, MD Sent: 06/30/2022   5:26 PM EDT To: Domenic Schwab, RPH; Jeannette Corpus, LPN; # Subject: call                                           Tell her kidney function much better, we don't need labs or IV fluids next week, just keep drinking

## 2022-07-05 NOTE — Telephone Encounter (Signed)
Patient notified and voiced understanding.

## 2022-07-06 NOTE — Progress Notes (Signed)
[facility]  [address] [place],  [place]  [address] [phone]  Clinic Day: 07/08/22  Referring physician: Philemon Kingdom, MD  ASSESSMENT & PLAN:  Gastric adenocarcinoma This is a poorly differentiated adenocarcinoma with signet ring features and ulceration. Stain for HER2 was negative at 0. I would consider it as metastatic to the omentum and so that would stage this as a T4 N0 M1, stage IVB.  She has been on treatment with chemotherapy with FOLFOX and nivolumab for 10 cycles with very good response, but then had increasing dermatologic toxicities and neuropathy. Her PET scan from 03/22/2022 shows a new area of hypermetabolic activity in the right lower lobe of peripheral consolidation, which is likely atelectasis or resolving pneumonia measuring 3cm. The thickening of her stomach has decreased with decreased metabolic activity and there is shrinkage of the omental metastasis from 22 mm to 13 mm. The last time she received chemotherapy alone she had severe rash and fever and was admitted to the hospital, but she was also found to have pneumonia and a UTI at that time. We are not sure which medication caused her rashes. Dr. Jennye Lewis did a EGD on March, 5th and found persistent poorly differentiated adenocarcinoma and a 3 cm mass of the distal body of the stomach. Since her current treatment is palliating her disease, we will continue.    Immune Mediated Nephritis Her creatinine is improved at 1.6 today and she will receive 1 liter of normal saline tomorrow and was encouraged to drink more fluids. Renal ultrasound was normal. We felt this was caused by her immunotherapy and placed her on Prednisone. I had ordered 10mg  tablets to take 20mg  twice daily but the patient misunderstood and has only been taking 10mg  twice daily. We have steadily tapered the Prednisone dose with good response and she is down to 5mg  daily with a creatinine of 1.0. I tapered her off  completely as of the end of April. Her creatinine went up a little bit last time to 1.27 and then 1.5 two weeks ago. I therefore gave her IV fluids and rechecked last week and it was down to 1.2. We will continue IV fluids with her chemotherapy treatments. Today her creatinine is 1.3.   Plan She had a echocardiogram on 07/02/2022 that showed left ventricular ejection fraction of 60 to 65% and GLS-18.3%. Her day 1 cycle 16 of FOLFOX is scheduled on 07/13/2022. Her WBC is 6.6, hemoglobin 11.7, and platelet count 203,000. Her CMP is normal other than an elevated glucose of 126, Bun of 19, and creatinine of 1.30. Her last PET scan was on 04/12/2022 and her last CT scan was on 12/23/2022. I will schedule a CT of abdomen and pelvis in July. [name] back in 2 weeks with CBC and CMP. I reviewed all of this with her and her daughter and answered their questions. She understands and agrees with this plan   I provided 30 minutes of face-to-face time during this this encounter and > 50% was spent counseling as documented under my assessment and plan.   Mackenzie Beckwith, MD [facility] AT [facility] [address] [place] [address] Dept: [phone] Dept Fax: [phone]   CHIEF COMPLAINT:  CC: Gastric cancer  Current Treatment: Chemotherapy/immunotherapy  HISTORY OF PRESENT ILLNESS:  Mackenzie Lewis is a 87 y.o. female with a history of gastric cancer who is referred in consultation with Dr. Shara Lewis for  assessment and management.  She had noticed that she was having regurgitation when eating and had lost over 30 pounds.  An ultrasound was done, revealing hepatic steatosis, and led to an MRI scan on June 5 which revealed gastric wall thickening with confluent nodularity of the omentum anterior to the stomach measuring 3.5 cm consistent with metastatic tumor.  She also had low-grade edema and wall thickening extending into the duodenum  from the stomach.  She was referred to Dr. Shara Lewis and he did an EGD on July 7.  This revealed a large ulceration measuring 1.2 cm along the greater curvature.  She also had diffusely edematous and erythematous wall with erosions of the antrum and stiff and friable mucosa with oozing of blood.  These findings extended to the gastric fundus as well.  Pathology revealed a poorly differentiated adenocarcinoma with signet ring features from the biopsies of the ulcer as well as the antrum and the fundus of the stomach.  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  She was placed on omeprazole.  Her test for H. pylori was negative.  She was referred to Dr. Hardin Lewis for consideration of surgery but he felt this was not resectable because of the extensive involvement and I agree.  I would consider this extending to the duodenum and also metastatic to the omentum.  PET scan confirmed these findings and she wished to pursue systemic intravenous therapy.  She continues to eat and has adjusted her diet to softer foods and liquids and is drinking boost so she has maintained her weight.  She does have some Zofran ODT for nausea when needed.  She has had some anorexia, nausea, occasional vomiting, early satiety, dysphagia, and belching.  A CEA and CA 19-9 were normal. She has been started on FOLFOX chemotherapy along with immunotherapy.  I have reviewed her chart and materials related to her cancer extensively and collaborated history with the patient. Summary of oncologic history is as follows: Oncology History  Gastric cancer (HCC)  09/10/2021 Initial Diagnosis   Gastric cancer (HCC)   09/10/2021 Cancer Staging   Staging form: Stomach, AJCC 8th Edition - Clinical stage from 09/10/2021: Stage IVB (cT4b, cN0, cM1) - Signed by Mackenzie Beckwith, MD on 09/10/2021 Histopathologic type: Adenocarcinoma, NOS Stage prefix: Initial diagnosis Total positive nodes:  0 Histologic grade (G): G3 Histologic grading system: 3 grade system Sites of metastasis: Peritoneal surface Diagnostic confirmation: Positive histology PLUS positive immunophenotyping and/or positive genetic studies Specimen type: Endoscopy with Biopsy Staged by: Managing physician Carcinoembryonic antigen (CEA) (ng/mL): 2.8 Carbohydrate antigen 19-9 (CA 19-9) (U/mL): 4.9 HER2 status: Unknown Microsatellite instability (MSI): Unknown Tumor location in stomach: Other Clinical staging modalities: Biopsy, Endoscopy Stage used in treatment planning: Yes National guidelines used in treatment planning: Yes Type of national guideline used in treatment planning: NCCN   09/28/2021 - 10/14/2021 Chemotherapy   Patient is on Treatment Plan : GASTROESOPHAGEAL FOLFOX + Nivolumab q14d     09/28/2021 -  Chemotherapy   Patient is on Treatment Plan : GASTROESOPHAGEAL FOLFOX + Nivolumab q14d     12/09/2021 Genetic Testing   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  Report date is 12/09/2021.   The Multi-Cancer + RNA Panel offered by [facility] includes sequencing and/or deletion/duplication analysis of the following 84 genes:  AIP*, ALK, APC*, ATM*, AXIN2*, BAP1*, BARD1*, BLM*, BMPR1A*, BRCA1*, BRCA2*, BRIP1*, CASR, CDC73*, CDH1*, CDK4, CDKN1B*, CDKN1C*, CDKN2A, CEBPA, CHEK2*, CTNNA1*, DICER1*, DIS3L2*,  EGFR, EPCAM, FH*, FLCN*, GATA2*, GPC3, GREM1, HOXB13, HRAS, KIT, MAX*, MEN1*, MET, MITF, MLH1*, MSH2*, MSH3*, MSH6*, MUTYH*, NBN*, NF1*, NF2*, NTHL1*, PALB2*, PDGFRA, PHOX2B, PMS2*, POLD1*, POLE*, POT1*, PRKAR1A*, PTCH1*, PTEN*, RAD50*, RAD51C*, RAD51D*, RB1*, RECQL4, RET, RUNX1*, SDHA*, SDHAF2*, SDHB*, SDHC*, SDHD*, SMAD4*, SMARCA4*, SMARCB1*, SMARCE1*, STK11*, SUFU*, TERC, TERT, TMEM127*, Tp53*, TSC1*, TSC2*, VHL*, WRN*, and WT1.  RNA analysis is performed for * genes.     INTERVAL HISTORY:  Mackenzie Lewis is here today for a follow-up for her gastric cancer. Patient  states that she feels well and complains of left shoulder pain. She had a "spot" on her left wrist froze off at the dermatologist and was told it was something genetic and not to worry about it. She had a echocardiogram on 07/02/2022 that showed left ventricular ejection fraction of 60 to 65% and GLS-18.3%. Her day 1 cycle 16 of FOLFOX is scheduled on 07/13/2022. Her WBC is 6.6, hemoglobin 11.7, and platelet count 203,000. Her CMP is normal other than an elevated glucose of 126, Bun of 19, and creatinine of 1.30. Her last PET scan was on 04/12/2022 and her last CT scan was on 12/23/2022. I will schedule a CT of abdomen and pelvis in July. [name] back in 2 weeks with CBC and CMP.  She denies signs of infection such as sore throat, sinus drainage, or urinary symptoms.  She denies fevers or recurrent chills. She denies nausea, vomiting, chest pain, dyspnea or cough. Her appetite is good and her weight has decreased 1 pounds over last 1.5 weeks . She is accompanied at today's visit by her daughter.   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  If that happens, she is to call us so that we can schedule her for an ultrasound to look for synovitis.     Essential hypertension 09/09/2016   Gastric cancer (HCC) 09/10/2021   Hyperlipidemia 09/01/2016   Hypertension    Hypothyroidism 09/01/2016   Osteoarthritis of both feet 01/20/2016   Osteoarthritis, hand 01/20/2016    Thyroid disease   Degenerative disc disease History of endometriosis  Past Surgical History:  Procedure Laterality Date   APPENDECTOMY     LAPAROSCOPIC APPENDECTOMY N/A 04/10/2016   Procedure: APPENDECTOMY LAPAROSCOPIC;  Surgeon: Abigail Miyamoto, MD;  Location: [facility] OR;  Service: General;  Laterality: N/A;  Bilateral tubal ligation Total hysterectomy and bilateral salpingo-oophorectomy in 1980  Family History  Problem Relation Age of Onset   Hypertension Mother    Prostate cancer Father        metastatic; d. 52   Brain cancer Sister 56   Breast cancer Sister 29   AAA (abdominal aortic aneurysm) Brother    Leukemia Cousin        x2 maternal female cousins; d. before 70   Breast cancer Daughter 50       DCIS  Her sister has had breast cancer as well as a tumor of her head Her daughter has had breast cancer last  year  Social History:  reports that she has never smoked. She has never used smokeless tobacco. She reports that she does not currently use alcohol. She reports that she does not use drugs.The patient is accompanied by her son, daughter-in-law and daughter today.  She is single but lives with a significant other.  She has the 2 children.  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  She grew up in [place] and [place].  She is active and healthy, especially for her age.  Allergies:  Allergies  Allergen Reactions   Doxycycline Rash   Sulfa Antibiotics Rash and Other (See Comments)    Other reaction(s): Other (See Comments)  "Made me feel weird"    Current Medications: Current Outpatient Medications  Medication Sig Dispense Refill   aspirin 81 MG EC tablet Take 81 mg by mouth daily. Swallow whole.     b complex vitamins capsule Take 1 capsule by mouth daily.     Calcium Carbonate (CALCIUM 600 PO) Take 1 tablet by mouth daily.     Cholecalciferol (VITAMIN D3) 5000 units CAPS Take 1 capsule by mouth daily.     EXFORGE HCT 5-160-12.5 MG TABS Take 1 tablet  by mouth daily.     famotidine (PEPCID) 40 MG tablet Take 40 mg by mouth at bedtime.     KRILL OIL PO Take 1 capsule by mouth daily. Unknown strenght     Lactobacillus TABS Take 1 tablet by mouth 2 (two) times daily.     levothyroxine (SYNTHROID, LEVOTHROID) 75 MCG tablet Take 75 mcg by mouth daily before breakfast.      magic mouthwash SOLN SWISH AND SWALLOW BY MOUTH EVERY THREE TO FOUR HOURS     NON FORMULARY MMW: 3 parts Maalox 2 parts Benadryl 1 part viscious lidicaine  Disp. 6oz  Instructions: 5ml swish and swallow every 3-4 hours     omeprazole (PRILOSEC) 40 MG capsule Take 1 capsule (40 mg total) by mouth 2 (two) times daily. 60 capsule 5   ondansetron (ZOFRAN-ODT) 4 MG disintegrating tablet Take 1 tablet (4 mg total) by mouth every 8 (eight) hours as needed for nausea or vomiting. 90 tablet 0   polyethylene glycol powder (GLYCOLAX/MIRALAX) 17 GM/SCOOP powder SMARTSIG:1 scoopful By Mouth Daily     potassium chloride SA (KLOR-CON M) 20 MEQ tablet TAKE ONE TABLET BY MOUTH THREE TIMES DAILY 90 tablet 5   pravastatin (PRAVACHOL) 20 MG tablet Take 1 tablet (20 mg total) by mouth every evening. 90 tablet 3   predniSONE (DELTASONE) 10 MG tablet Take 2 tablets (20 mg total) by mouth 2 (two) times daily with a meal. (Patient taking differently: Take 10 mg by mouth 2 (two) times daily with a meal. One 10mg  tablet twice a day) 100 tablet 1   Probiotic Product (PROBIOTIC DAILY PO) Take 1 tablet by mouth daily.     prochlorperazine (COMPAZINE) 10 MG tablet Take 1 tablet (10 mg total) by mouth every 6 (six) hours as needed for nausea or vomiting. 90 tablet 3   triamcinolone 0.1% oint-Eucerin equivalent cream 1:1 mixture Apply topically 2 (two) times daily. 160 g 0   triamcinolone cream (KENALOG) 0.1 % Apply to affected skin twice daily (Patient taking differently: daily as needed. Apply to affected skin twice daily) 160 g 0   No current facility-administered medications for this visit.     REVIEW OF SYSTEMS:  Review of Systems  Constitutional: Negative.  Negative for appetite change, chills, diaphoresis, fatigue,  fever and unexpected weight change.  HENT:   Negative for hearing loss, lump/mass, mouth sores, nosebleeds, sore throat, tinnitus, trouble swallowing and voice change (improved).   Eyes:  Negative for eye problems and icterus.  Respiratory: Negative.  Negative for chest tightness, cough, hemoptysis, shortness of breath and wheezing.   Cardiovascular: Negative.  Negative for chest pain, leg swelling and palpitations.  Gastrointestinal:  Negative for abdominal distention, abdominal pain, blood in stool, constipation, diarrhea, nausea, rectal pain and vomiting.  Endocrine: Negative.  Negative for hot flashes.  Genitourinary: Negative.  Negative for bladder incontinence, difficulty urinating, dyspareunia, dysuria, frequency, hematuria, menstrual problem, nocturia, pelvic pain, vaginal bleeding and vaginal discharge.   Musculoskeletal: Negative.  Negative for arthralgias, back pain, flank pain, gait problem, myalgias, neck pain and neck stiffness.  Skin:  Positive for rash. Negative for itching and wound.        Occasional blotchy discoloration of her lower extremities, but much improved   Neurological:  Positive for numbness (in her fingers and toes). Negative for dizziness, extremity weakness, gait problem, headaches, light-headedness, seizures and speech difficulty.  Hematological:  Negative for adenopathy. Bruises/bleeds easily.  Psychiatric/Behavioral: Negative.  Negative for confusion, decreased concentration, depression, sleep disturbance and suicidal ideas. The patient is not nervous/anxious.     VITALS:  Blood pressure (!) 144/67, pulse 84, temperature 97.7 F (36.5 C), temperature source Oral, resp. rate 18, height 5\' 4"  (1.626 m), weight 198 lb 12.8 oz (90.2 kg), SpO2 96 %.  Wt Readings from Last 3 Encounters:  07/22/22 201 lb 8 oz (91.4 kg)  07/15/22 204 lb  (92.5 kg)  07/13/22 201 lb 1.3 oz (91.2 kg)    Body mass index is 34.12 kg/m.  Performance status (ECOG): 1 - Symptomatic but completely ambulatory  PHYSICAL EXAM:  Physical Exam Vitals and nursing note reviewed. Exam conducted with a chaperone present.  Constitutional:      General: She is not in acute distress.    Appearance: Normal appearance. She is normal weight. She is not ill-appearing, toxic-appearing or diaphoretic.  HENT:     Head: Normocephalic and atraumatic.     Right Ear: Tympanic membrane, ear canal and external ear normal. There is no impacted cerumen.     Left Ear: Tympanic membrane, ear canal and external ear normal. There is no impacted cerumen.     Nose: Nose normal. No congestion or rhinorrhea.     Mouth/Throat:     Mouth: Mucous membranes are moist.     Pharynx: Oropharynx is clear. No oropharyngeal exudate or posterior oropharyngeal erythema.  Eyes:     General: No scleral icterus.       Right eye: No discharge.        Left eye: No discharge.     Extraocular Movements: Extraocular movements intact.     Conjunctiva/sclera: Conjunctivae normal.     Pupils: Pupils are equal, round, and reactive to light.  Neck:     Vascular: No carotid bruit.  Cardiovascular:     Rate and Rhythm: Normal rate and regular rhythm.     Pulses: Normal pulses.     Heart sounds: Normal heart sounds. No murmur heard.    No friction rub. No gallop.  Pulmonary:     Effort: Pulmonary effort is normal. No respiratory distress.     Breath sounds: Normal breath sounds. No stridor. No wheezing, rhonchi or rales.  Chest:     Chest wall: No tenderness.  Abdominal:     General: Bowel sounds are  normal. There is no distension.     Palpations: Abdomen is soft. There is no hepatomegaly, splenomegaly or mass.     Tenderness: There is no abdominal tenderness. There is no right CVA tenderness, left CVA tenderness, guarding or rebound.     Hernia: No hernia is present.     Comments: Mild  firmness deep in the epigastrium.    Musculoskeletal:        General: No swelling, tenderness, deformity or signs of injury. Normal range of motion.     Cervical back: Normal range of motion and neck supple. No rigidity or tenderness.     Right lower leg: No edema.     Left lower leg: No edema.  Lymphadenopathy:     Cervical: No cervical adenopathy.     Right cervical: No superficial, deep or posterior cervical adenopathy.    Left cervical: No superficial, deep or posterior cervical adenopathy.     Upper Body:     Right upper body: No supraclavicular, axillary or pectoral adenopathy.     Left upper body: No supraclavicular, axillary or pectoral adenopathy.  Skin:    General: Skin is warm and dry.     Coloration: Skin is not jaundiced or pale.     Findings: No bruising, erythema, lesion or rash.     Comments: Rash on her legs is faded and nearly resolved.   Neurological:     General: No focal deficit present.     Mental Status: She is alert and oriented to person, place, and time. Mental status is at baseline.     Cranial Nerves: No cranial nerve deficit.     Sensory: No sensory deficit.     Motor: No weakness.     Coordination: Coordination normal.     Gait: Gait normal.     Deep Tendon Reflexes: Reflexes normal.  Psychiatric:        Mood and Affect: Mood normal.        Behavior: Behavior normal.        Thought Content: Thought content normal.        Judgment: Judgment normal.    LABS:   Component Ref Range & Units 8 d ago (06/30/22) 13 d ago (06/25/22) 3 wk ago (06/16/22) 4 wk ago (06/10/22) 1 mo ago (05/27/22) 2 mo ago (04/29/22) 2 mo ago (04/29/22)  Glucose 104 127 91 R, CM 117 130 155 High  R, CM 160  BUN 4 - 21 26 Abnormal  20 23 R 21 20 41 High  R 42 Abnormal   CO2 13 - 22 22 24  Abnormal  22 R 25 Abnormal  21 24 R 25 Abnormal   Creatinine 0.5 - 1.1 1.2 Abnormal  1.5 Abnormal  1.27 High  R 1.4 Abnormal  1.0 1.67 High  R 1.5 Abnormal   Potassium 3.5 - 5.1 mEq/L 3.8  4.6 3.2 Low  R 3.4 Abnormal  4.2 3.7 R 3.7  Sodium 137 - 147 139 139 140 R 137 136 Abnormal  137 R 136 Abnormal   Chloride 99 - 108 107 106 107 R 103 105 103 R 105            Component Ref Range & Units 8 d ago (06/30/22) 13 d ago (06/25/22) 3 wk ago (06/16/22) 4 wk ago (06/10/22) 1 mo ago (05/27/22) 2 mo ago (04/29/22) 2 mo ago (04/29/22)  Calcium 8.7 - 10.7 9.5 9.9 9.0 R 9.3 9.8 9.2 R 9.2      Latest Ref  Rng & Units 07/22/2022   12:00 AM 07/08/2022   12:00 AM 06/25/2022   12:00 AM  CBC  WBC  9.4     6.6     6.6      Hemoglobin 12.0 - 16.0 12.1     11.7     12.1      Hematocrit 36 - 46 34     33     35      Platelets 150 - 400 K/uL 219     203     217         This result is from an external source.      Latest Ref Rng & Units 07/22/2022   12:00 AM 07/08/2022   12:00 AM 06/30/2022   12:00 AM  CMP  BUN 4 - 21 25     19     26       Creatinine 0.5 - 1.1 1.3     1.3     1.2      Sodium 137 - 147 139     138     139      Potassium 3.5 - 5.1 mEq/L 3.8     3.8     3.8      Chloride 99 - 108 106     108     107      CO2 13 - 22 19     24     22       Calcium 8.7 - 10.7 9.4     9.2     9.5      Alkaline Phos 25 - 125 63     69       AST 13 - 35 28     24       ALT 7 - 35 U/L 17     16          This result is from an external source.   Lab Results  Component Value Date   CEA1 2.9 09/10/2021   /  CEA  Date Value Ref Range Status  09/10/2021 2.9 0.0 - 4.7 ng/mL Final    Comment:    (NOTE)                             Nonsmokers          <3.9                             Smokers             <5.6 [employer] Electrochemiluminescence Immunoassay (ECLIA) Values obtained with different assay methods or kits cannot be used interchangeably.  Results cannot be interpreted as absolute evidence of the presence or absence of malignant disease. Performed At: [facility] [address] [place], [place] [id number] Jolene Schimke MD WU:[id number]    Component Ref Range &  Units 11 d ago (06/25/22) 3 wk ago (06/10/22) 1 mo ago (05/27/22) 2 mo ago (04/29/22) 2 mo ago (04/15/22) 3 mo ago (04/07/22) 3 mo ago (03/24/22)  TSH 0.350 - 4.500 uIU/mL 2.455 2.595 CM 0.990 CM 0.410 CM 0.892 CM 1.895 CM 1.835 CM     Component Ref Range & Units 11 d ago (06/25/22) 3 wk ago (06/10/22) 1 mo ago (05/27/22) 2 mo ago (04/29/22) 2 mo ago (04/15/22) 3 mo ago (04/07/22) 3 mo ago (  03/24/22)  T4, Total 4.5 - 12.0 ug/dL 16.1 09.6 CM 9.6 CM 7.6 CM 10.9 CM 10.1 CM 10.5 CM       No results found for: "PSA1" No results found for: "EAV409" No results found for: "CAN125"  No results found for: "TOTALPROTELP", "ALBUMINELP", "A1GS", "A2GS", "BETS", "BETA2SER", "GAMS", "MSPIKE", "SPEI" No results found for: "TIBC", "FERRITIN", "IRONPCTSAT" No results found for: "LDH"  STUDIES:  ECHOCARDIOGRAM COMPLETE  Result Date: 07/02/2022    ECHOCARDIOGRAM REPORT   Patient Name:   TALANA COPPOCK Graber Date of Exam: 07/02/2022 Medical Rec #:  [id number]      Height:       64.0 in Accession #:    [id number]     Weight:       202.0 lb Date of Birth:  Sep 01, 1935      BSA:          1.965 m Patient Age:    87 years       BP:           136/58 mmHg Patient Gender: F              HR:           83 bpm. Exam Location:  [place] Procedure: 2D Echo, Cardiac Doppler, Color Doppler and Strain Analysis Indications:    Essential hypertension [I10 (ICD-10-CM)]; Mixed hyperlipidemia                 [E78.2 (ICD-10-CM)]; Benign hypertensive renal disease [I12.9                 (ICD-10-CM)]; Dyspnea, unspecified type [R06.00 (ICD-10-CM)]  History:        Patient has prior history of Echocardiogram examinations, most                 recent 12/24/2020. Signs/Symptoms:Dyspnea and Chest Pain; Risk                 Factors:Dyslipidemia and Hypertension.  Sonographer:    Margreta Journey RDCS Referring Phys: [id number] ROBERT J KRASOWSKI IMPRESSIONS  1. Left ventricular ejection fraction, by estimation, is 60 to 65%. The left ventricle has normal  function. The left ventricle has no regional wall motion abnormalities. Left ventricular diastolic parameters are consistent with Grade I diastolic dysfunction (impaired relaxation).GLS-18.3%  2. Right ventricular systolic function is normal. The right ventricular size is normal. There is normal pulmonary artery systolic pressure.  3. Left atrial size was mildly dilated.  4. The mitral valve is normal in structure. Moderate mitral valve regurgitation. No evidence of mitral stenosis.  5. The aortic valve is normal in structure. Aortic valve regurgitation is not visualized. No aortic stenosis is present.  6. The inferior vena cava is normal in size with greater than 50% respiratory variability, suggesting right atrial pressure of 3 mmHg. FINDINGS  Left Ventricle: Left ventricular ejection fraction, by estimation, is 60 to 65%. The left ventricle has normal function. The left ventricle has no regional wall motion abnormalities. The left ventricular internal cavity size was normal in size. There is  no left ventricular hypertrophy. Left ventricular diastolic parameters are consistent with Grade I diastolic dysfunction (impaired relaxation). Right Ventricle: The right ventricular size is normal. No increase in right ventricular wall thickness. Right ventricular systolic function is normal. There is normal pulmonary artery systolic pressure. The tricuspid regurgitant velocity is 2.30 m/s, and  with an assumed right atrial pressure of 3 mmHg, the estimated right ventricular systolic pressure is 24.2 mmHg. Left Atrium:  Left atrial size was mildly dilated. Right Atrium: Right atrial size was normal in size. Pericardium: There is no evidence of pericardial effusion. Mitral Valve: The mitral valve is normal in structure. Moderate mitral valve regurgitation. No evidence of mitral valve stenosis. Tricuspid Valve: The tricuspid valve is normal in structure. Tricuspid valve regurgitation is not demonstrated. No evidence of  tricuspid stenosis. Aortic Valve: The aortic valve is normal in structure. Aortic valve regurgitation is not visualized. No aortic stenosis is present. Aortic valve mean gradient measures 8.7 mmHg. Aortic valve peak gradient measures 15.3 mmHg. Aortic valve area, by VTI measures 1.77 cm. Pulmonic Valve: The pulmonic valve was normal in structure. Pulmonic valve regurgitation is not visualized. No evidence of pulmonic stenosis. Aorta: The aortic root is normal in size and structure. Venous: The inferior vena cava is normal in size with greater than 50% respiratory variability, suggesting right atrial pressure of 3 mmHg. IAS/Shunts: No atrial level shunt detected by color flow Doppler.  LEFT VENTRICLE PLAX 2D LVIDd:         4.30 cm   Diastology LVIDs:         2.60 cm   LV e' medial:    5.66 cm/s LV PW:         1.20 cm   LV E/e' medial:  16.9 LV IVS:        1.40 cm   LV e' lateral:   5.87 cm/s LVOT diam:     2.00 cm   LV E/e' lateral: 16.3 LV SV:         68 LV SV Index:   34 LVOT Area:     3.14 cm  RIGHT VENTRICLE             IVC RV Basal diam:  2.50 cm     IVC diam: 1.80 cm RV Mid diam:    2.50 cm RV S prime:     11.60 cm/s TAPSE (M-mode): 2.4 cm LEFT ATRIUM             Index        RIGHT ATRIUM           Index LA diam:        4.00 cm 2.04 cm/m   RA Area:     15.90 cm LA Vol (A2C):   46.6 ml 23.72 ml/m  RA Volume:   35.40 ml  18.02 ml/m LA Vol (A4C):   46.5 ml 23.67 ml/m LA Biplane Vol: 48.7 ml 24.79 ml/m  AORTIC VALVE AV Area (Vmax):    1.75 cm AV Area (Vmean):   1.77 cm AV Area (VTI):     1.77 cm AV Vmax:           195.67 cm/s AV Vmean:          135.333 cm/s AV VTI:            0.382 m AV Peak Grad:      15.3 mmHg AV Mean Grad:      8.7 mmHg LVOT Vmax:         109.00 cm/s LVOT Vmean:        76.250 cm/s LVOT VTI:          0.215 m LVOT/AV VTI ratio: 0.56  AORTA Ao Root diam: 3.30 cm Ao Asc diam:  3.70 cm Ao Desc diam: 1.70 cm MITRAL VALVE                  TRICUSPID VALVE MV Area (PHT):  4.41 cm       TR Peak  grad:   21.2 mmHg MV Decel Time: 172 msec       TR Vmax:        230.00 cm/s MR Peak grad:    156.8 mmHg MR Mean grad:    101.0 mmHg   SHUNTS MR Vmax:         626.00 cm/s  Systemic VTI:  0.22 m MR Vmean:        478.0 cm/s   Systemic Diam: 2.00 cm MR PISA:         1.01 cm MR PISA Eff ROA: 6 mm MR PISA Radius:  0.40 cm MV E velocity: 95.40 cm/s MV A velocity: 144.00 cm/s MV E/A ratio:  0.66 Belva Crome MD Electronically signed by Belva Crome MD Signature Date/Time: 07/02/2022/4:57:33 PM    Final      EXAM: 03/22/2022 NUCLEAR MEDICINE PET SKULL BASE TO THIGH IMPRESSION: 1. Resolution of metabolic activity associated with the stomach. 2. Decrease in size of omental nodularity. No associated metabolic activity. 3. No evidence of new or progressive gastric carcinoma. 4. New peripheral consolidation in the RIGHT lower lobe with moderate metabolic activity. Favor focus of atelectasis versus less likely pneumonia. 5.  Aortic Atherosclerosis (ICD10-I70.0).    EXAM:12/16/21 CT CHEST, ABDOMEN, AND PELVIS WITH CONTRAST IMPRESSION: Response to therapy of gastric primary and adjacent omental nodularity. No new or progressive disease. Right larger than left lower lobe pulmonary nodules, felt to be similar to the 04/10/2016 remote CT and therefore benign. Progressive interstitial lung disease, right greater than left. This could be postinfectious/inflammatory or less likely represent nonspecific interstitial pneumonitis. Improvement in common duct dilation, likely incidental given chronicity.    I,Jasmine M Lassiter,acting as a scribe for Mackenzie Beckwith, MD.,have documented all relevant documentation on the behalf of Mackenzie Beckwith, MD,as directed by  Mackenzie Beckwith, MD while in the presence of Mackenzie Beckwith, MD.

## 2022-07-08 ENCOUNTER — Encounter: Payer: Self-pay | Admitting: Oncology

## 2022-07-08 ENCOUNTER — Inpatient Hospital Stay: Payer: Medicare Other | Admitting: Oncology

## 2022-07-08 ENCOUNTER — Inpatient Hospital Stay: Payer: Medicare Other

## 2022-07-08 VITALS — BP 144/67 | HR 84 | Temp 97.7°F | Resp 18 | Ht 64.0 in | Wt 198.8 lb

## 2022-07-08 DIAGNOSIS — C168 Malignant neoplasm of overlapping sites of stomach: Secondary | ICD-10-CM

## 2022-07-08 DIAGNOSIS — C169 Malignant neoplasm of stomach, unspecified: Secondary | ICD-10-CM | POA: Diagnosis not present

## 2022-07-08 DIAGNOSIS — Z5111 Encounter for antineoplastic chemotherapy: Secondary | ICD-10-CM | POA: Diagnosis not present

## 2022-07-08 DIAGNOSIS — D649 Anemia, unspecified: Secondary | ICD-10-CM | POA: Diagnosis not present

## 2022-07-08 DIAGNOSIS — Z79899 Other long term (current) drug therapy: Secondary | ICD-10-CM | POA: Diagnosis not present

## 2022-07-08 LAB — HEPATIC FUNCTION PANEL
ALT: 16 U/L (ref 7–35)
AST: 24 (ref 13–35)
Alkaline Phosphatase: 69 (ref 25–125)
Bilirubin, Total: 0.5

## 2022-07-08 LAB — BASIC METABOLIC PANEL
BUN: 19 (ref 4–21)
CO2: 24 — AB (ref 13–22)
Chloride: 108 (ref 99–108)
Creatinine: 1.3 — AB (ref 0.5–1.1)
Glucose: 126
Potassium: 3.8 mEq/L (ref 3.5–5.1)
Sodium: 138 (ref 137–147)

## 2022-07-08 LAB — CBC AND DIFFERENTIAL
HCT: 33 — AB (ref 36–46)
Hemoglobin: 11.7 — AB (ref 12.0–16.0)
Neutrophils Absolute: 3.37
Platelets: 203 10*3/uL (ref 150–400)
WBC: 6.6

## 2022-07-08 LAB — CBC: RBC: 3.64 — AB (ref 3.87–5.11)

## 2022-07-08 LAB — COMPREHENSIVE METABOLIC PANEL
Albumin: 4.3 (ref 3.5–5.0)
Calcium: 9.2 (ref 8.7–10.7)

## 2022-07-08 LAB — CBC W DIFFERENTIAL (~~LOC~~ CC SCANNED REPORT)

## 2022-07-08 LAB — TSH: TSH: 2.103 u[IU]/mL (ref 0.350–4.500)

## 2022-07-08 LAB — COMPREHENSIVE METABOLIC PANEL (ASHBORO CC SCANNED REPORT)

## 2022-07-09 ENCOUNTER — Telehealth: Payer: Self-pay

## 2022-07-09 ENCOUNTER — Other Ambulatory Visit: Payer: Self-pay

## 2022-07-09 LAB — T4: T4, Total: 10.5 ug/dL (ref 4.5–12.0)

## 2022-07-09 MED FILL — Fluorouracil IV Soln 5 GM/100ML (50 MG/ML): INTRAVENOUS | Qty: 77 | Status: AC

## 2022-07-09 MED FILL — Fluorouracil IV Soln 2.5 GM/50ML (50 MG/ML): INTRAVENOUS | Qty: 13 | Status: AC

## 2022-07-09 MED FILL — Dexamethasone Sodium Phosphate Inj 100 MG/10ML: INTRAMUSCULAR | Qty: 1 | Status: AC

## 2022-07-09 MED FILL — Nivolumab IV Soln 240 MG/24ML: INTRAVENOUS | Qty: 24 | Status: AC

## 2022-07-09 NOTE — Telephone Encounter (Signed)
Patient notified of results.

## 2022-07-09 NOTE — Telephone Encounter (Signed)
-----   Message from Georgeanna Lea, MD sent at 07/05/2022 12:31 PM EDT ----- Echocardiogram showed preserved left ventricle ejection fraction, moderate mitral valve regurgitation left atrium mildly enlarged overall looks good

## 2022-07-10 ENCOUNTER — Encounter: Payer: Self-pay | Admitting: Oncology

## 2022-07-12 ENCOUNTER — Other Ambulatory Visit: Payer: Self-pay | Admitting: Cardiology

## 2022-07-13 ENCOUNTER — Inpatient Hospital Stay: Payer: Medicare Other

## 2022-07-13 ENCOUNTER — Ambulatory Visit: Payer: TRICARE For Life (TFL)

## 2022-07-13 VITALS — BP 154/65 | HR 90 | Temp 98.1°F | Resp 18 | Ht 60.0 in | Wt 201.1 lb

## 2022-07-13 DIAGNOSIS — C168 Malignant neoplasm of overlapping sites of stomach: Secondary | ICD-10-CM

## 2022-07-13 DIAGNOSIS — Z79899 Other long term (current) drug therapy: Secondary | ICD-10-CM | POA: Diagnosis not present

## 2022-07-13 DIAGNOSIS — Z5111 Encounter for antineoplastic chemotherapy: Secondary | ICD-10-CM | POA: Diagnosis not present

## 2022-07-13 DIAGNOSIS — C169 Malignant neoplasm of stomach, unspecified: Secondary | ICD-10-CM | POA: Diagnosis not present

## 2022-07-13 MED ORDER — DEXTROSE 5 % IV SOLN: Freq: Once | INTRAVENOUS | Status: AC

## 2022-07-13 MED ORDER — LEUCOVORIN CALCIUM INJECTION 350 MG
320.0000 mg/m2 | Freq: Once | INTRAVENOUS | Status: AC
  Administered 2022-07-13: 630 mg via INTRAVENOUS
  Filled 2022-07-13: qty 31.5

## 2022-07-13 MED ORDER — SODIUM CHLORIDE 0.9 % IV SOLN
1958.0000 mg/m2 | INTRAVENOUS | Status: DC
  Administered 2022-07-13: 3850 mg via INTRAVENOUS
  Filled 2022-07-13: qty 77

## 2022-07-13 MED ORDER — SODIUM CHLORIDE 0.9 % IV SOLN: Freq: Once | INTRAVENOUS | Status: AC

## 2022-07-13 MED ORDER — FLUOROURACIL CHEMO INJECTION 2.5 GM/50ML
320.0000 mg/m2 | Freq: Once | INTRAVENOUS | Status: AC
  Administered 2022-07-13: 650 mg via INTRAVENOUS
  Filled 2022-07-13: qty 13

## 2022-07-13 MED ORDER — SODIUM CHLORIDE 0.9 % IV SOLN
10.0000 mg | Freq: Once | INTRAVENOUS | Status: AC
  Administered 2022-07-13: 10 mg via INTRAVENOUS
  Filled 2022-07-13: qty 10

## 2022-07-13 MED ORDER — SODIUM CHLORIDE 0.9% FLUSH: 10.0000 mL | INTRAVENOUS | Status: DC | PRN

## 2022-07-13 MED ORDER — SODIUM CHLORIDE 0.9 % IV SOLN
240.0000 mg | Freq: Once | INTRAVENOUS | Status: AC
  Administered 2022-07-13: 240 mg via INTRAVENOUS
  Filled 2022-07-13: qty 24

## 2022-07-13 MED ORDER — PALONOSETRON HCL INJECTION 0.25 MG/5ML
0.2500 mg | Freq: Once | INTRAVENOUS | Status: AC
  Administered 2022-07-13: 0.25 mg via INTRAVENOUS
  Filled 2022-07-13: qty 5

## 2022-07-13 MED ORDER — HEPARIN SOD (PORK) LOCK FLUSH 100 UNIT/ML IV SOLN: 500.0000 [IU] | Freq: Once | INTRAVENOUS | Status: DC | PRN

## 2022-07-13 NOTE — Patient Instructions (Signed)

## 2022-07-14 ENCOUNTER — Encounter: Payer: Self-pay | Admitting: Oncology

## 2022-07-15 ENCOUNTER — Inpatient Hospital Stay: Payer: Medicare Other

## 2022-07-15 ENCOUNTER — Encounter: Payer: Self-pay | Admitting: Licensed Clinical Social Worker

## 2022-07-15 VITALS — BP 127/59 | HR 78 | Temp 98.0°F | Resp 18 | Ht 60.0 in | Wt 204.0 lb

## 2022-07-15 DIAGNOSIS — C169 Malignant neoplasm of stomach, unspecified: Secondary | ICD-10-CM | POA: Diagnosis not present

## 2022-07-15 DIAGNOSIS — C168 Malignant neoplasm of overlapping sites of stomach: Secondary | ICD-10-CM

## 2022-07-15 DIAGNOSIS — Z5111 Encounter for antineoplastic chemotherapy: Secondary | ICD-10-CM | POA: Diagnosis not present

## 2022-07-15 DIAGNOSIS — Z79899 Other long term (current) drug therapy: Secondary | ICD-10-CM | POA: Diagnosis not present

## 2022-07-15 MED ORDER — HEPARIN SOD (PORK) LOCK FLUSH 100 UNIT/ML IV SOLN
500.0000 [IU] | Freq: Once | INTRAVENOUS | Status: AC | PRN
  Administered 2022-07-15: 500 [IU]

## 2022-07-15 MED ORDER — SODIUM CHLORIDE 0.9% FLUSH
10.0000 mL | INTRAVENOUS | Status: DC | PRN
  Administered 2022-07-15: 10 mL

## 2022-07-15 MED ORDER — SODIUM CHLORIDE 0.9 % IV SOLN: Freq: Once | INTRAVENOUS | Status: AC

## 2022-07-15 NOTE — Patient Instructions (Signed)

## 2022-07-15 NOTE — Progress Notes (Signed)
[facility] CSW Progress Note  Clinical Child psychotherapist contacted patient by phone to discuss resources. Patient is concerned about fuel costs. CSW sent referral to Audiological scientist. CSW recommended patient contact Medicare and/to [employer] to find outif they offer transportation services.  Patient verbalized understanding.    Joseph Art, LCSW Clinical Social Worker [facility]

## 2022-07-16 ENCOUNTER — Encounter: Payer: Self-pay | Admitting: Oncology

## 2022-07-16 NOTE — Progress Notes (Signed)
[facility]  [address] [place],  [place]  [address] [phone]  Clinic Day: 07/22/22  Referring physician: Philemon Kingdom, MD  ASSESSMENT & PLAN:  Gastric adenocarcinoma This is a poorly differentiated adenocarcinoma with signet ring features and ulceration. Stain for HER2 was negative at 0. I would consider it as metastatic to the omentum and so that would stage this as a T4 N0 M1, stage IVB.  She has been on treatment with chemotherapy with FOLFOX and nivolumab for 10 cycles with very good response, but then had increasing dermatologic toxicities and neuropathy. Her PET scan from 03/22/2022 shows a new area of hypermetabolic activity in the right lower lobe of peripheral consolidation, which is likely atelectasis or resolving pneumonia measuring 3cm. The thickening of her stomach has decreased with decreased metabolic activity and there is shrinkage of the omental metastasis from 22 mm to 13 mm. The last time she received chemotherapy alone she had severe rash and fever and was admitted to the hospital, but she was also found to have pneumonia and a UTI at that time. We are not sure which medication caused her rashes. Dr. Jennye Boroughs did a EGD on March, 5th and found persistent poorly differentiated adenocarcinoma and a 3 cm mass of the distal body of the stomach. Since her current treatment is palliating her disease, we will continue.    Immune Mediated Nephritis Her creatinine is improved with normal saline IV and she was encouraged to drink more fluids. Renal ultrasound was normal. We felt this was caused by her immunotherapy and placed her on Prednisone for a short course and low dose, which was effective. I tapered her off completely as of the end of April. Her creatinine went up a little bit last time to 1.27 and then 1.5 two weeks ago. I therefore gave her IV fluids and rechecked last week and it was down to 1.2 and remains stable today at 1.3. We  will continue IV fluids with her chemotherapy treatments.     Plan  I will refer her to a podiatrist in [place]. She had a echocardiogram done on 07/02/2022 that showed left ventricular ejection fraction estimation of 60 to 65% and GLS-18.3%. Her day 1 cycle 17 of FOLFOX (minus the Oxaliplatin) and Nivolumab is scheduled on 07/27/2022. Her WBC is 9.4, hemoglobin is 12.1, and platelet count 219,000. Her creatinine is stable at 1.3 with a BUN of 25. She will be due for repeat scan in mid July. She will receive IV fluids next Monday and Wednesday. [name] back in 2 weeks with CBC and CMP. I reviewed all of this with her and her daughter and answered their questions. She understands and agrees with this plan   I provided 20 minutes of face-to-face time during this this encounter and > 50% was spent counseling as documented under my assessment and plan.   Dellia Beckwith, MD [facility] [address] [place] [address] Dept: [phone] Dept Fax: [phone]     CHIEF COMPLAINT:  CC: Gastric cancer  Current Treatment: Chemotherapy/immunotherapy  HISTORY OF PRESENT ILLNESS:  Mackenzie Lewis is a 87 y.o. female with a history of gastric cancer who is referred in consultation with Dr. Shara Blazing for assessment and management.  She had noticed that she was having regurgitation when eating and had lost over 30 pounds.  An ultrasound was done, revealing hepatic steatosis, and led to an MRI  scan on June 5 which revealed gastric wall thickening with confluent nodularity of the omentum anterior to the stomach measuring 3.5 cm consistent with metastatic tumor.  She also had low-grade edema and wall thickening extending into the duodenum from the stomach.  She was referred to Dr. Shara Blazing and he did an EGD on July 7.  This revealed a large ulceration measuring 1.2 cm along the greater curvature.  She also had diffusely  edematous and erythematous wall with erosions of the antrum and stiff and friable mucosa with oozing of blood.  These findings extended to the gastric fundus as well.  Pathology revealed a poorly differentiated adenocarcinoma with signet ring features from the biopsies of the ulcer as well as the antrum and the fundus of the stomach.  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  She was placed on omeprazole.  Her test for H. pylori was negative.  She was referred to Dr. Hardin Negus for consideration of surgery but he felt this was not resectable because of the extensive involvement and I agree.  I would consider this extending to the duodenum and also metastatic to the omentum.  PET scan confirmed these findings and she wished to pursue systemic intravenous therapy.  She continues to eat and has adjusted her diet to softer foods and liquids and is drinking boost so she has maintained her weight.  She does have some Zofran ODT for nausea when needed.  She has had some anorexia, nausea, occasional vomiting, early satiety, dysphagia, and belching.  A CEA and CA 19-9 were normal. She has been started on FOLFOX chemotherapy along with immunotherapy.  I have reviewed her chart and materials related to her cancer extensively and collaborated history with the patient. Summary of oncologic history is as follows: Oncology History  Gastric cancer (HCC)  09/10/2021 Initial Diagnosis   Gastric cancer (HCC)   09/10/2021 Cancer Staging   Staging form: Stomach, AJCC 8th Edition - Clinical stage from 09/10/2021: Stage IVB (cT4b, cN0, cM1) - Signed by Dellia Beckwith, MD on 09/10/2021 Histopathologic type: Adenocarcinoma, NOS Stage prefix: Initial diagnosis Total positive nodes: 0 Histologic grade (G): G3 Histologic grading system: 3 grade system Sites of metastasis: Peritoneal surface Diagnostic confirmation: Positive histology PLUS positive immunophenotyping and/or  positive genetic studies Specimen type: Endoscopy with Biopsy Staged by: Managing physician Carcinoembryonic antigen (CEA) (ng/mL): 2.8 Carbohydrate antigen 19-9 (CA 19-9) (U/mL): 4.9 HER2 status: Unknown Microsatellite instability (MSI): Unknown Tumor location in stomach: Other Clinical staging modalities: Biopsy, Endoscopy Stage used in treatment planning: Yes National guidelines used in treatment planning: Yes Type of national guideline used in treatment planning: NCCN   09/28/2021 - 10/14/2021 Chemotherapy   Patient is on Treatment Plan : GASTROESOPHAGEAL FOLFOX + Nivolumab q14d     09/28/2021 -  Chemotherapy   Patient is on Treatment Plan : GASTROESOPHAGEAL FOLFOX + Nivolumab q14d     12/09/2021 Genetic Testing   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  Report date is 12/09/2021.   The Multi-Cancer + RNA Panel offered by [facility] includes sequencing and/or deletion/duplication analysis of the following 84 genes:  AIP*, ALK, APC*, ATM*, AXIN2*, BAP1*, BARD1*, BLM*, BMPR1A*, BRCA1*, BRCA2*, BRIP1*, CASR, CDC73*, CDH1*, CDK4, CDKN1B*, CDKN1C*, CDKN2A, CEBPA, CHEK2*, CTNNA1*, DICER1*, DIS3L2*, EGFR, EPCAM, FH*, FLCN*, GATA2*, GPC3, GREM1, HOXB13, HRAS, KIT, MAX*, MEN1*, MET, MITF, MLH1*, MSH2*, MSH3*, MSH6*, MUTYH*, NBN*, NF1*, NF2*, NTHL1*, PALB2*, PDGFRA, PHOX2B, PMS2*, POLD1*, POLE*, POT1*, PRKAR1A*, PTCH1*, PTEN*,  RAD50*, RAD51C*, RAD51D*, RB1*, RECQL4, RET, RUNX1*, SDHA*, SDHAF2*, SDHB*, SDHC*, SDHD*, SMAD4*, SMARCA4*, SMARCB1*, SMARCE1*, STK11*, SUFU*, TERC, TERT, TMEM127*, Tp53*, TSC1*, TSC2*, VHL*, WRN*, and WT1.  RNA analysis is performed for * genes.     INTERVAL HISTORY:  Mackenzie Lewis is here today for a follow-up for her gastric cancer, she continues maintenance treatment with 5FU, Leucovorin, and Nivolumab. Patient states that she feels well and complains of left shoulder pain in her shoulder blade area, she describes it as feeling like a  pinched nerve. We discussed the fact that this is likely arthritis but possibly a rotator cuff injury, she will try some exercises to see if that will help. Her daughter informed me that she still hasn't seen a podiatrist. I will refer her to a podiatrist in [place]. She had a echocardiogram done on 07/02/2022 that showed left ventricular ejection fraction estimation of 60 to 65% and GLS-18.3%. Her day 1 cycle 17 of FOLFOX and Nivolumab is scheduled on 07/27/2022. Her WBC is 9.4, hemoglobin is at 12.1, and platelet count at 219,000. Her creatinine is stable at 1.3 with a BUN of 25. She will be due for repeat scan in mid July. She will receive IV fluids next Monday and Wednesday. [name] back in 2 weeks with CBC and CMP. She denies signs of infection such as sore throat, sinus drainage, cough, or urinary symptoms.  She denies fevers or recurrent chills. She denies pain. She denies nausea, vomiting, chest pain, dyspnea or cough. Her appetite is very good and her weight has been stable.She is accompanied at today's visit by her daughter.   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  If that happens, she is to call us so that we can schedule her for an ultrasound to look for synovitis.     Essential hypertension 09/09/2016   Gastric cancer (HCC) 09/10/2021   Hyperlipidemia 09/01/2016   Hypertension    Hypothyroidism 09/01/2016   Osteoarthritis of both feet 01/20/2016    Osteoarthritis, hand 01/20/2016   Thyroid disease   Degenerative disc disease History of endometriosis  Past Surgical History:  Procedure Laterality Date   APPENDECTOMY     LAPAROSCOPIC APPENDECTOMY N/A 04/10/2016   Procedure: APPENDECTOMY LAPAROSCOPIC;  Surgeon: Abigail Miyamoto, MD;  Location: [facility] OR;  Service: General;  Laterality: N/A;  Bilateral tubal ligation Total hysterectomy and bilateral salpingo-oophorectomy in 1980  Family History  Problem Relation Age of Onset   Hypertension Mother    Prostate cancer Father        metastatic; d. 18   Brain cancer Sister 51   Breast cancer Sister 45   AAA (abdominal aortic aneurysm) Brother    Leukemia Cousin        x2 maternal female cousins; d. before 56   Breast cancer Daughter 65       DCIS  Her sister has had breast cancer as well as a tumor of her head Her daughter has had breast cancer last  year  Social History:  reports that she has never smoked. She has never used smokeless tobacco. She reports that she does not currently use alcohol. She reports that she does not use drugs.The patient is accompanied by her son, daughter-in-law and daughter today.  She is single but lives with a significant other.  She has the 2 children.  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  She grew up in [place] and [place].  She is active and healthy, especially for her age.  Allergies:  Allergies  Allergen Reactions   Doxycycline Rash   Sulfa Antibiotics Rash and Other (See Comments)    Other reaction(s): Other (See Comments)  "Made me feel weird"    Current Medications: Current Outpatient Medications  Medication Sig Dispense Refill   aspirin 81 MG EC tablet Take 81 mg by mouth daily. Swallow whole.     b complex vitamins capsule Take 1 capsule by mouth daily.     Calcium Carbonate (CALCIUM 600 PO) Take 1 tablet by mouth daily.     Cholecalciferol (VITAMIN D3) 5000 units CAPS Take 1 capsule by mouth daily.     EXFORGE  HCT 5-160-12.5 MG TABS Take 1 tablet by mouth daily.     famotidine (PEPCID) 40 MG tablet Take 40 mg by mouth at bedtime.     KRILL OIL PO Take 1 capsule by mouth daily. Unknown strenght     Lactobacillus TABS Take 1 tablet by mouth 2 (two) times daily.     levothyroxine (SYNTHROID, LEVOTHROID) 75 MCG tablet Take 75 mcg by mouth daily before breakfast.      magic mouthwash SOLN SWISH AND SWALLOW BY MOUTH EVERY THREE TO FOUR HOURS     NON FORMULARY MMW: 3 parts Maalox 2 parts Benadryl 1 part viscious lidicaine  Disp. 6oz  Instructions: 5ml swish and swallow every 3-4 hours     omeprazole (PRILOSEC) 40 MG capsule Take 1 capsule (40 mg total) by mouth 2 (two) times daily. 60 capsule 5   ondansetron (ZOFRAN-ODT) 4 MG disintegrating tablet Take 1 tablet (4 mg total) by mouth every 8 (eight) hours as needed for nausea or vomiting. 90 tablet 0   polyethylene glycol powder (GLYCOLAX/MIRALAX) 17 GM/SCOOP powder SMARTSIG:1 scoopful By Mouth Daily     potassium chloride SA (KLOR-CON M) 20 MEQ tablet TAKE ONE TABLET BY MOUTH THREE TIMES DAILY 90 tablet 5   pravastatin (PRAVACHOL) 20 MG tablet Take 1 tablet (20 mg total) by mouth every evening. 90 tablet 3   predniSONE (DELTASONE) 10 MG tablet Take 2 tablets (20 mg total) by mouth 2 (two) times daily with a meal. (Patient taking differently: Take 10 mg by mouth 2 (two) times daily with a meal. One 10mg  tablet twice a day) 100 tablet 1   Probiotic Product (PROBIOTIC DAILY PO) Take 1 tablet by mouth daily.     prochlorperazine (COMPAZINE) 10 MG tablet Take 1 tablet (10 mg total) by mouth every 6 (six) hours as needed for nausea or vomiting. 90 tablet 3   triamcinolone 0.1% oint-Eucerin equivalent cream 1:1 mixture Apply topically 2 (two) times daily. 160 g 0   triamcinolone cream (KENALOG) 0.1 % Apply to affected skin twice daily (Patient taking differently: daily as needed. Apply to affected skin twice daily) 160 g 0   No current facility-administered  medications for this visit.   REVIEW OF SYSTEMS:  Review of Systems  Constitutional: Negative.  Negative for appetite change, chills, diaphoresis, fatigue, fever  and unexpected weight change.  HENT:   Negative for hearing loss, lump/mass, mouth sores, nosebleeds, sore throat, tinnitus, trouble swallowing and voice change.   Eyes:  Negative for eye problems and icterus.  Respiratory: Negative.  Negative for chest tightness, cough, hemoptysis, shortness of breath and wheezing.   Cardiovascular: Negative.  Negative for chest pain, leg swelling and palpitations.  Gastrointestinal: Negative.  Negative for abdominal distention, abdominal pain, blood in stool, constipation, diarrhea, nausea, rectal pain and vomiting.  Endocrine: Negative.  Negative for hot flashes.  Genitourinary: Negative.  Negative for bladder incontinence, difficulty urinating, dyspareunia, dysuria, frequency, hematuria, menstrual problem, nocturia, pelvic pain, vaginal bleeding and vaginal discharge.   Musculoskeletal: Negative.  Negative for arthralgias, back pain, flank pain, gait problem, myalgias, neck pain and neck stiffness.       Left shoulder pain in the scapula area.   Skin: Negative.  Negative for itching, rash and wound.       Occasional blotchy discoloration of her lower extremities, but much improved  Neurological:  Positive for numbness (in her fingers and toes). Negative for dizziness, extremity weakness, gait problem, headaches, light-headedness, seizures and speech difficulty.  Hematological:  Negative for adenopathy. Bruises/bleeds easily.  Psychiatric/Behavioral: Negative.  Negative for confusion, decreased concentration, depression, sleep disturbance and suicidal ideas. The patient is not nervous/anxious.    VITALS:  Blood pressure 137/71, pulse 95, temperature 98.2 F (36.8 C), temperature source Oral, resp. rate 18, height 5' (1.524 m), weight 201 lb 8 oz (91.4 kg), SpO2 98 %.  Wt Readings from Last 3  Encounters:  07/28/22 204 lb 12 oz (92.9 kg)  07/26/22 200 lb 1.9 oz (90.8 kg)  07/22/22 201 lb 8 oz (91.4 kg)    Body mass index is 39.35 kg/m.  Performance status (ECOG): 1 - Symptomatic but completely ambulatory  PHYSICAL EXAM:  Physical Exam Vitals and nursing note reviewed. Exam conducted with a chaperone present.  Constitutional:      General: She is not in acute distress.    Appearance: Normal appearance. She is normal weight. She is not ill-appearing, toxic-appearing or diaphoretic.  HENT:     Head: Normocephalic and atraumatic.     Right Ear: Tympanic membrane, ear canal and external ear normal. There is no impacted cerumen.     Left Ear: Tympanic membrane, ear canal and external ear normal. There is no impacted cerumen.     Nose: Nose normal. No congestion or rhinorrhea.     Mouth/Throat:     Mouth: Mucous membranes are moist.     Pharynx: Oropharynx is clear. No oropharyngeal exudate or posterior oropharyngeal erythema.  Eyes:     General: No scleral icterus.       Right eye: No discharge.        Left eye: No discharge.     Extraocular Movements: Extraocular movements intact.     Conjunctiva/sclera: Conjunctivae normal.     Pupils: Pupils are equal, round, and reactive to light.  Neck:     Vascular: No carotid bruit.  Cardiovascular:     Rate and Rhythm: Normal rate and regular rhythm.     Pulses: Normal pulses.     Heart sounds: Normal heart sounds. No murmur heard.    No friction rub. No gallop.  Pulmonary:     Effort: Pulmonary effort is normal. No respiratory distress.     Breath sounds: Normal breath sounds. No stridor. No wheezing, rhonchi or rales.  Chest:     Chest wall: No tenderness.  Abdominal:     General: Bowel sounds are normal. There is no distension.     Palpations: Abdomen is soft. There is no hepatomegaly, splenomegaly or mass.     Tenderness: There is no abdominal tenderness. There is no right CVA tenderness, left CVA tenderness, guarding or  rebound.     Hernia: No hernia is present.     Comments: Slight firmness in the epigastrium with deep palpation.   Musculoskeletal:        General: No swelling, tenderness, deformity or signs of injury.     Left shoulder: Decreased range of motion.     Cervical back: Normal range of motion and neck supple. No rigidity or tenderness.     Right lower leg: 1+ Edema present.     Left lower leg: 1+ Edema present.  Feet:     Right foot:     Toenail Condition: Right toenails are abnormally thick.     Left foot:     Toenail Condition: Left toenails are abnormally thick.     Comments: I will refer her to a podiatrist for her abnormal toenails Lymphadenopathy:     Cervical: No cervical adenopathy.     Right cervical: No superficial, deep or posterior cervical adenopathy.    Left cervical: No superficial, deep or posterior cervical adenopathy.     Upper Body:     Right upper body: No supraclavicular, axillary or pectoral adenopathy.     Left upper body: No supraclavicular, axillary or pectoral adenopathy.  Skin:    General: Skin is warm and dry.     Coloration: Skin is not jaundiced or pale.     Findings: No bruising, erythema, lesion or rash.     Comments: Rash on her legs is faded and nearly resolved  Neurological:     General: No focal deficit present.     Mental Status: She is alert and oriented to person, place, and time. Mental status is at baseline.     Cranial Nerves: No cranial nerve deficit.     Sensory: No sensory deficit.     Motor: No weakness.     Coordination: Coordination normal.     Gait: Gait normal.     Deep Tendon Reflexes: Reflexes normal.  Psychiatric:        Mood and Affect: Mood normal.        Behavior: Behavior normal.        Thought Content: Thought content normal.        Judgment: Judgment normal.    LABS:      Latest Ref Rng & Units 07/22/2022   12:00 AM 07/08/2022   12:00 AM 06/25/2022   12:00 AM  CBC  WBC  9.4     6.6     6.6      Hemoglobin 12.0 -  16.0 12.1     11.7     12.1      Hematocrit 36 - 46 34     33     35      Platelets 150 - 400 K/uL 219     203     217         This result is from an external source.      Latest Ref Rng & Units 07/22/2022   12:00 AM 07/08/2022   12:00 AM 06/30/2022   12:00 AM  CMP  BUN 4 - 21 25     19      26  Creatinine 0.5 - 1.1 1.3     1.3     1.2      Sodium 137 - 147 139     138     139      Potassium 3.5 - 5.1 mEq/L 3.8     3.8     3.8      Chloride 99 - 108 106     108     107      CO2 13 - 22 19     24     22       Calcium 8.7 - 10.7 9.4     9.2     9.5      Alkaline Phos 25 - 125 63     69       AST 13 - 35 28     24       ALT 7 - 35 U/L 17     16          This result is from an external source.   Lab Results  Component Value Date   CEA1 2.9 09/10/2021   /  CEA  Date Value Ref Range Status  09/10/2021 2.9 0.0 - 4.7 ng/mL Final    Comment:    (NOTE)                             Nonsmokers          <3.9                             Smokers             <5.6 [employer] Electrochemiluminescence Immunoassay (ECLIA) Values obtained with different assay methods or kits cannot be used interchangeably.  Results cannot be interpreted as absolute evidence of the presence or absence of malignant disease. Performed At: [facility] [address] [place], [place] [id number] Jolene Schimke MD MV:[id number]    Component Ref Range & Units 8 d ago (07/08/22) 3 wk ago (06/25/22) 1 mo ago (06/10/22) 1 mo ago (05/27/22) 2 mo ago (04/29/22) 3 mo ago (04/15/22) 3 mo ago (04/07/22)  T4, Total 4.5 - 12.0 ug/dL 84.1 32.4 CM 40.1 CM 9.6 CM 7.6 CM 10.9 CM 10.1 CM     Component Ref Range & Units 8 d ago (07/08/22) 3 wk ago (06/25/22) 1 mo ago (06/10/22) 1 mo ago (05/27/22) 2 mo ago (04/29/22) 3 mo ago (04/15/22) 3 mo ago (04/07/22)  TSH 0.350 - 4.500 uIU/mL 2.103 2.455 CM 2.595 CM 0.990 CM 0.410 CM 0.892 CM 1.895 CM     Component Ref Range & Units 06/10/22  Vitamin B-12 180 - 914  pg/mL 302   Component Ref Range & Units 06/10/22 2 yr ago 3 yr ago  Cholesterol 0 - 200 mg/dL 027    Triglycerides <253 mg/dL 664 403 R 474 High  R  HDL >40 mg/dL 77 64 R 62 R  Total CHOL/HDL Ratio RATIO 2.3 2.8 R, CM 3.3 R, CM  VLDL 0 - 40 mg/dL 29    LDL Cholesterol 0 - 99 mg/dL 70      No results found for: "PSA1" No results found for: "QVZ563" No results found for: "CAN125"  No results found for: "TOTALPROTELP", "ALBUMINELP", "A1GS", "A2GS", "BETS", "BETA2SER", "GAMS", "MSPIKE", "SPEI" No results found for: "TIBC", "FERRITIN", "IRONPCTSAT" No results found for: "LDH"  STUDIES:  ECHOCARDIOGRAM COMPLETE  Result Date: 07/02/2022    ECHOCARDIOGRAM REPORT   Patient Name:   MAGDELYN BELTRAMI Date of Exam: 07/02/2022 Medical Rec #:  [id number]      Height:       64.0 in Accession #:    [id number]     Weight:       202.0 lb Date of Birth:  Oct 26, 1935      BSA:          1.965 m Patient Age:    87 years       BP:           136/58 mmHg Patient Gender: F              HR:           83 bpm. Exam Location:  [place] Procedure: 2D Echo, Cardiac Doppler, Color Doppler and Strain Analysis Indications:    Essential hypertension [I10 (ICD-10-CM)]; Mixed hyperlipidemia                 [E78.2 (ICD-10-CM)]; Benign hypertensive renal disease [I12.9                 (ICD-10-CM)]; Dyspnea, unspecified type [R06.00 (ICD-10-CM)]  History:        Patient has prior history of Echocardiogram examinations, most                 recent 12/24/2020. Signs/Symptoms:Dyspnea and Chest Pain; Risk                 Factors:Dyslipidemia and Hypertension.  Sonographer:    Margreta Journey RDCS Referring Phys: [id number] ROBERT J KRASOWSKI IMPRESSIONS  1. Left ventricular ejection fraction, by estimation, is 60 to 65%. The left ventricle has normal function. The left ventricle has no regional wall motion abnormalities. Left ventricular diastolic parameters are consistent with Grade I diastolic dysfunction (impaired relaxation).GLS-18.3%  2.  Right ventricular systolic function is normal. The right ventricular size is normal. There is normal pulmonary artery systolic pressure.  3. Left atrial size was mildly dilated.  4. The mitral valve is normal in structure. Moderate mitral valve regurgitation. No evidence of mitral stenosis.  5. The aortic valve is normal in structure. Aortic valve regurgitation is not visualized. No aortic stenosis is present.  6. The inferior vena cava is normal in size with greater than 50% respiratory variability, suggesting right atrial pressure of 3 mmHg. FINDINGS  Left Ventricle: Left ventricular ejection fraction, by estimation, is 60 to 65%. The left ventricle has normal function. The left ventricle has no regional wall motion abnormalities. The left ventricular internal cavity size was normal in size. There is  no left ventricular hypertrophy. Left ventricular diastolic parameters are consistent with Grade I diastolic dysfunction (impaired relaxation). Right Ventricle: The right ventricular size is normal. No increase in right ventricular wall thickness. Right ventricular systolic function is normal. There is normal pulmonary artery systolic pressure. The tricuspid regurgitant velocity is 2.30 m/s, and  with an assumed right atrial pressure of 3 mmHg, the estimated right ventricular systolic pressure is 24.2 mmHg. Left Atrium: Left atrial size was mildly dilated. Right Atrium: Right atrial size was normal in size. Pericardium: There is no evidence of pericardial effusion. Mitral Valve: The mitral valve is normal in structure. Moderate mitral valve regurgitation. No evidence of mitral valve stenosis. Tricuspid Valve: The tricuspid valve is normal in structure. Tricuspid valve regurgitation is not demonstrated. No evidence of tricuspid stenosis. Aortic Valve: The aortic valve is normal  in structure. Aortic valve regurgitation is not visualized. No aortic stenosis is present. Aortic valve mean gradient measures 8.7 mmHg.  Aortic valve peak gradient measures 15.3 mmHg. Aortic valve area, by VTI measures 1.77 cm. Pulmonic Valve: The pulmonic valve was normal in structure. Pulmonic valve regurgitation is not visualized. No evidence of pulmonic stenosis. Aorta: The aortic root is normal in size and structure. Venous: The inferior vena cava is normal in size with greater than 50% respiratory variability, suggesting right atrial pressure of 3 mmHg. IAS/Shunts: No atrial level shunt detected by color flow Doppler.  LEFT VENTRICLE PLAX 2D LVIDd:         4.30 cm   Diastology LVIDs:         2.60 cm   LV e' medial:    5.66 cm/s LV PW:         1.20 cm   LV E/e' medial:  16.9 LV IVS:        1.40 cm   LV e' lateral:   5.87 cm/s LVOT diam:     2.00 cm   LV E/e' lateral: 16.3 LV SV:         68 LV SV Index:   34 LVOT Area:     3.14 cm  RIGHT VENTRICLE             IVC RV Basal diam:  2.50 cm     IVC diam: 1.80 cm RV Mid diam:    2.50 cm RV S prime:     11.60 cm/s TAPSE (M-mode): 2.4 cm LEFT ATRIUM             Index        RIGHT ATRIUM           Index LA diam:        4.00 cm 2.04 cm/m   RA Area:     15.90 cm LA Vol (A2C):   46.6 ml 23.72 ml/m  RA Volume:   35.40 ml  18.02 ml/m LA Vol (A4C):   46.5 ml 23.67 ml/m LA Biplane Vol: 48.7 ml 24.79 ml/m  AORTIC VALVE AV Area (Vmax):    1.75 cm AV Area (Vmean):   1.77 cm AV Area (VTI):     1.77 cm AV Vmax:           195.67 cm/s AV Vmean:          135.333 cm/s AV VTI:            0.382 m AV Peak Grad:      15.3 mmHg AV Mean Grad:      8.7 mmHg LVOT Vmax:         109.00 cm/s LVOT Vmean:        76.250 cm/s LVOT VTI:          0.215 m LVOT/AV VTI ratio: 0.56  AORTA Ao Root diam: 3.30 cm Ao Asc diam:  3.70 cm Ao Desc diam: 1.70 cm MITRAL VALVE                  TRICUSPID VALVE MV Area (PHT): 4.41 cm       TR Peak grad:   21.2 mmHg MV Decel Time: 172 msec       TR Vmax:        230.00 cm/s MR Peak grad:    156.8 mmHg MR Mean grad:    101.0 mmHg   SHUNTS MR Vmax:         626.00 cm/s  Systemic VTI:  0.22 m MR  Vmean:        478.0 cm/s   Systemic Diam: 2.00 cm MR PISA:         1.01 cm MR PISA Eff ROA: 6 mm MR PISA Radius:  0.40 cm MV E velocity: 95.40 cm/s MV A velocity: 144.00 cm/s MV E/A ratio:  0.66 Belva Crome MD Electronically signed by Belva Crome MD Signature Date/Time: 07/02/2022/4:57:33 PM    Final      EXAM: 03/22/2022 NUCLEAR MEDICINE PET SKULL BASE TO THIGH IMPRESSION: 1. Resolution of metabolic activity associated with the stomach. 2. Decrease in size of omental nodularity. No associated metabolic activity. 3. No evidence of new or progressive gastric carcinoma. 4. New peripheral consolidation in the RIGHT lower lobe with moderate metabolic activity. Favor focus of atelectasis versus less likely pneumonia. 5.  Aortic Atherosclerosis (ICD10-I70.0).     I,Jasmine M Lassiter,acting as a scribe for Dellia Beckwith, MD.,have documented all relevant documentation on the behalf of Dellia Beckwith, MD,as directed by  Dellia Beckwith, MD while in the presence of Dellia Beckwith, MD.

## 2022-07-22 ENCOUNTER — Inpatient Hospital Stay: Payer: Medicare Other | Admitting: Oncology

## 2022-07-22 ENCOUNTER — Encounter: Payer: Self-pay | Admitting: Oncology

## 2022-07-22 ENCOUNTER — Inpatient Hospital Stay: Payer: Medicare Other | Attending: Hematology and Oncology

## 2022-07-22 VITALS — BP 137/71 | HR 95 | Temp 98.2°F | Resp 18 | Ht 60.0 in | Wt 201.5 lb

## 2022-07-22 DIAGNOSIS — C168 Malignant neoplasm of overlapping sites of stomach: Secondary | ICD-10-CM

## 2022-07-22 DIAGNOSIS — Z79899 Other long term (current) drug therapy: Secondary | ICD-10-CM | POA: Insufficient documentation

## 2022-07-22 DIAGNOSIS — C169 Malignant neoplasm of stomach, unspecified: Secondary | ICD-10-CM | POA: Diagnosis not present

## 2022-07-22 DIAGNOSIS — Z5111 Encounter for antineoplastic chemotherapy: Secondary | ICD-10-CM | POA: Diagnosis not present

## 2022-07-22 DIAGNOSIS — D649 Anemia, unspecified: Secondary | ICD-10-CM | POA: Diagnosis not present

## 2022-07-22 LAB — BASIC METABOLIC PANEL
BUN: 25 — AB (ref 4–21)
CO2: 19 (ref 13–22)
Chloride: 106 (ref 99–108)
Creatinine: 1.3 — AB (ref 0.5–1.1)
EGFR: 39
Glucose: 138
Potassium: 3.8 mEq/L (ref 3.5–5.1)
Sodium: 139 (ref 137–147)

## 2022-07-22 LAB — CBC AND DIFFERENTIAL
HCT: 34 — AB (ref 36–46)
Hemoglobin: 12.1 (ref 12.0–16.0)
Neutrophils Absolute: 4.98
Platelets: 219 10*3/uL (ref 150–400)
WBC: 9.4

## 2022-07-22 LAB — COMPREHENSIVE METABOLIC PANEL
Albumin: 4.3 (ref 3.5–5.0)
Calcium: 9.4 (ref 8.7–10.7)

## 2022-07-22 LAB — CBC: RBC: 3.75 — AB (ref 3.87–5.11)

## 2022-07-22 LAB — TSH: TSH: 2.035 u[IU]/mL (ref 0.350–4.500)

## 2022-07-22 LAB — HEPATIC FUNCTION PANEL
ALT: 17 U/L (ref 7–35)
AST: 28 (ref 13–35)
Alkaline Phosphatase: 63 (ref 25–125)
Bilirubin, Total: 0.5

## 2022-07-23 ENCOUNTER — Other Ambulatory Visit: Payer: Self-pay | Admitting: Oncology

## 2022-07-23 ENCOUNTER — Telehealth: Payer: Self-pay

## 2022-07-23 DIAGNOSIS — E876 Hypokalemia: Secondary | ICD-10-CM

## 2022-07-23 MED FILL — Leucovorin Calcium For Inj 350 MG: INTRAMUSCULAR | Qty: 31.5 | Status: AC

## 2022-07-23 MED FILL — Dexamethasone Sodium Phosphate Inj 100 MG/10ML: INTRAMUSCULAR | Qty: 1 | Status: AC

## 2022-07-23 MED FILL — Nivolumab IV Soln 240 MG/24ML: INTRAVENOUS | Qty: 24 | Status: AC

## 2022-07-23 NOTE — Telephone Encounter (Signed)
Referral sent electronically to [facility] at [facility] via EPIC.

## 2022-07-23 NOTE — Telephone Encounter (Signed)
-----   Message from Dellia Beckwith, MD sent at 07/22/2022  4:09 PM EDT ----- Regarding: refer Pls refer podiatrist for abnormal toenails in pt with neuropathy

## 2022-07-24 LAB — T4: T4, Total: 11.3 ug/dL (ref 4.5–12.0)

## 2022-07-25 ENCOUNTER — Encounter: Payer: Self-pay | Admitting: Oncology

## 2022-07-26 ENCOUNTER — Inpatient Hospital Stay: Payer: Medicare Other

## 2022-07-26 VITALS — BP 136/77 | HR 95 | Temp 98.9°F | Resp 14 | Ht 60.0 in | Wt 200.1 lb

## 2022-07-26 DIAGNOSIS — C168 Malignant neoplasm of overlapping sites of stomach: Secondary | ICD-10-CM

## 2022-07-26 DIAGNOSIS — Z79899 Other long term (current) drug therapy: Secondary | ICD-10-CM | POA: Diagnosis not present

## 2022-07-26 DIAGNOSIS — Z5111 Encounter for antineoplastic chemotherapy: Secondary | ICD-10-CM | POA: Diagnosis not present

## 2022-07-26 DIAGNOSIS — C169 Malignant neoplasm of stomach, unspecified: Secondary | ICD-10-CM | POA: Diagnosis not present

## 2022-07-26 MED ORDER — LEUCOVORIN CALCIUM INJECTION 350 MG
320.0000 mg/m2 | Freq: Once | INTRAVENOUS | Status: AC
  Administered 2022-07-26: 630 mg via INTRAVENOUS
  Filled 2022-07-26: qty 31.5

## 2022-07-26 MED ORDER — PALONOSETRON HCL INJECTION 0.25 MG/5ML
0.2500 mg | Freq: Once | INTRAVENOUS | Status: AC
  Administered 2022-07-26: 0.25 mg via INTRAVENOUS
  Filled 2022-07-26: qty 5

## 2022-07-26 MED ORDER — SODIUM CHLORIDE 0.9 % IV SOLN
10.0000 mg | Freq: Once | INTRAVENOUS | Status: AC
  Administered 2022-07-26: 10 mg via INTRAVENOUS
  Filled 2022-07-26: qty 10

## 2022-07-26 MED ORDER — SODIUM CHLORIDE 0.9 % IV SOLN: Freq: Once | INTRAVENOUS | Status: AC

## 2022-07-26 MED ORDER — SODIUM CHLORIDE 0.9 % IV SOLN
1960.0000 mg/m2 | INTRAVENOUS | Status: DC
  Administered 2022-07-26: 3850 mg via INTRAVENOUS
  Filled 2022-07-26: qty 77

## 2022-07-26 MED ORDER — SODIUM CHLORIDE 0.9 % IV SOLN
240.0000 mg | Freq: Once | INTRAVENOUS | Status: AC
  Administered 2022-07-26: 240 mg via INTRAVENOUS
  Filled 2022-07-26: qty 24

## 2022-07-26 MED ORDER — DEXTROSE 5 % IV SOLN: Freq: Once | INTRAVENOUS | Status: AC

## 2022-07-26 MED ORDER — FLUOROURACIL CHEMO INJECTION 2.5 GM/50ML
320.0000 mg/m2 | Freq: Once | INTRAVENOUS | Status: AC
  Administered 2022-07-26: 650 mg via INTRAVENOUS
  Filled 2022-07-26: qty 13

## 2022-07-26 NOTE — Patient Instructions (Signed)
[facility] [place]  Discharge Instructions: Thank you for choosing [facility] to provide your oncology and hematology care.  If you have a lab appointment with the Cancer Center, please go directly to the Cancer Center and check in at the registration area.   Wear comfortable clothing and clothing appropriate for easy access to any Portacath or PICC line.   We strive to give you quality time with your provider. You may need to reschedule your appointment if you arrive late (15 or more minutes).  Arriving late affects you and other patients whose appointments are after yours.  Also, if you miss three or more appointments without notifying the office, you may be dismissed from the clinic at the provider's discretion.      For prescription refill requests, have your pharmacy contact our office and allow 72 hours for refills to be completed.    Today you received the following chemotherapy and/or immunotherapy agents: Opdivo Leucovorin, 5fu      To help prevent nausea and vomiting after your treatment, we encourage you to take your nausea medication as directed.  BELOW ARE SYMPTOMS THAT SHOULD BE REPORTED IMMEDIATELY: *FEVER GREATER THAN 100.4 F (38 C) OR HIGHER *CHILLS OR SWEATING *NAUSEA AND VOMITING THAT IS NOT CONTROLLED WITH YOUR NAUSEA MEDICATION *UNUSUAL SHORTNESS OF BREATH *UNUSUAL BRUISING OR BLEEDING *URINARY PROBLEMS (pain or burning when urinating, or frequent urination) *BOWEL PROBLEMS (unusual diarrhea, constipation, pain near the anus) TENDERNESS IN MOUTH AND THROAT WITH OR WITHOUT PRESENCE OF ULCERS (sore throat, sores in mouth, or a toothache) UNUSUAL RASH, SWELLING OR PAIN  UNUSUAL VAGINAL DISCHARGE OR ITCHING   Items with * indicate a potential emergency and should be followed up as soon as possible or go to the Emergency Department if any problems should occur.  Please show the CHEMOTHERAPY ALERT CARD or IMMUNOTHERAPY ALERT CARD at  check-in to the Emergency Department and triage nurse.  Should you have questions after your visit or need to cancel or reschedule your appointment, please contact [facility] [place]  Dept: [phone]  and follow the prompts.  Office hours are 8:00 a.m. to 4:30 p.m. Monday - Friday. Please note that voicemails left after 4:00 p.m. may not be returned until the following business day.  We are closed weekends and major holidays. You have access to a nurse at all times for urgent questions. Please call the main number to the clinic Dept: [phone] and follow the prompts.  For any non-urgent questions, you may also contact your provider using MyChart. We now offer e-Visits for anyone 18 and older to request care online for non-urgent symptoms. For details visit [email or web address].   Also download the MyChart app! Go to the app store, search "MyChart", open the app, select [place], and log in with your MyChart username and password.  

## 2022-07-27 ENCOUNTER — Encounter: Payer: Self-pay | Admitting: Oncology

## 2022-07-28 ENCOUNTER — Inpatient Hospital Stay: Payer: Medicare Other

## 2022-07-28 VITALS — BP 130/70 | HR 85 | Temp 98.4°F | Resp 16 | Ht 60.0 in | Wt 204.8 lb

## 2022-07-28 DIAGNOSIS — Z79899 Other long term (current) drug therapy: Secondary | ICD-10-CM | POA: Diagnosis not present

## 2022-07-28 DIAGNOSIS — C169 Malignant neoplasm of stomach, unspecified: Secondary | ICD-10-CM | POA: Diagnosis not present

## 2022-07-28 DIAGNOSIS — Z5111 Encounter for antineoplastic chemotherapy: Secondary | ICD-10-CM | POA: Diagnosis not present

## 2022-07-28 DIAGNOSIS — C168 Malignant neoplasm of overlapping sites of stomach: Secondary | ICD-10-CM

## 2022-07-28 MED ORDER — HEPARIN SOD (PORK) LOCK FLUSH 100 UNIT/ML IV SOLN
500.0000 [IU] | Freq: Once | INTRAVENOUS | Status: AC | PRN
  Administered 2022-07-28: 500 [IU]

## 2022-07-28 MED ORDER — SODIUM CHLORIDE 0.9% FLUSH
10.0000 mL | INTRAVENOUS | Status: DC | PRN
  Administered 2022-07-28: 10 mL

## 2022-07-28 MED ORDER — SODIUM CHLORIDE 0.9 % IV SOLN: Freq: Once | INTRAVENOUS | Status: AC

## 2022-07-28 NOTE — Patient Instructions (Signed)
Managing Chemotherapy Side Effects, Adult Chemotherapy is a treatment that uses medicine to kill cancer cells. However, in addition to killing cancer cells, the medicines can also damage healthy cells. The damage to healthy cells can lead to side effects. The exact side effects depend on the specific medicines used. Most of the side effects of chemotherapy go away once treatment is finished. Until then, work closely with your health care providers and take an active role in managing your side effects. What are common side effects of chemotherapy? Increased risk of infection, bruising, or bleeding. Nausea and vomiting. Constipation or diarrhea. Loss of appetite. Hair loss. Mouth or throat sores. Tiredness (fatigue). Tingling, pain, or numbness in the hands and feet. Dry, sensitive, itchy, or sore skin. Sleep disturbances, such as excessive sleepiness. Confusion, anxiety, or mood swings. Memory changes. How to manage the side effects of chemotherapy Medicines Take over-the-counter and prescription medicines only as told by your health care provider. Talk with your health care provider before taking vitamins, herbs, supplements, or over-the-counter medicines. Some of these can interfere with chemotherapy. Activity Get plenty of rest. Get regular exercise by doing activities such as walking, gentle yoga, or tai chi. Return to your normal activities as told by your health care provider. Ask your health care provider what activities are safe for you. Eating and drinking  Talk to a dietitian about what you should eat and drink during cancer treatment. Drink enough fluid to keep your urine pale yellow. If you have side effects that affect eating, these tips may help: Eat smaller meals and snacks often. Drink high-nutrition and high-calorie shakes or supplements. Choose bland and soft foods that are easy to eat. Do not eat foods that are hot, spicy, or hard to swallow. Do not eat raw or  undercooked meat, eggs, or seafood. Always wash fresh fruits and vegetables well before eating them. Skin care If you have sore or itchy skin: Wear soft, comfortable clothing. Apply creams and ointments to your skin as told by your health care provider. If you lose your hair, consider wearing a wig, hat, or scarf to cover your head. You may want to have someone shave your head as you start to lose hair. During outdoor activities, protect your head and skin from the sun by using sunscreen with an SPF of 30 or higher or by wearing protective clothing and a hat. Meet with a hair and skin care specialist for makeup and skin care tips. Apply sunscreen to your scalp as told by your health care provider. General tips Learn as much as you can about your condition. If you are struggling emotionally, talk with a mental health care provider or join a support group. Keep all follow-up visits. This is important. How to prevent infection and bleeding Chemotherapy may lower your blood counts and put you at risk for infection and bleeding. Here are some ways to help prevent problems. Vaccines Talk to your health care provider about vaccines. You should not get any live vaccines, such as the polio, MMR, chickenpox, and shingles vaccines until your health care provider says that it is safe to do so. Do not be around people who have had live vaccines for as long as your health care provider recommends. Make sure you get a yearly flu shot. People who will be near you should also get a yearly flu shot. Social activity Stay away from crowded places where you could be exposed to germs. Do not be around people who may be sick or   people who have symptoms of a fever until they have been fever-free for at least 24 hours. Do not share food, cups, straws, or utensils with other people. Wear a mask when outside the home if your blood counts are low. Cleanliness  Wash your hands often for at least 20 seconds. Also make  sure that other members of your household wash their hands often. Brush your teeth twice daily using a soft toothbrush. Use mouth rinse only as told by your health care provider. Take a bath or shower daily unless your health care provider gives different instructions. General tips Take your temperature regularly, especially if you have chills or feel warm. Check with your health care provider: Before you travel. Before you have a dental procedure. Before you use a swimming pool, hot tub, or swim in a lake or ocean. If you get chemotherapy through an IV or port, check the site every day for signs of infection. Check for redness, swelling, pain, fluid, and warmth. Avoid activities that put you at risk for injury or bleeding. Use an electric razor to shave instead of a blade. Questions to ask your health care provider What are the most common side effects of my treatment? How will they affect my daily life? What can I do to manage them? What are some possible long-term side effects? What are possible complications? What support services are available? What number can I call with questions or concerns? Where to find support Cancer affects the entire family. Find out what family support resources are available from your cancer treatment center. For more support, turn to: Your cancer care team. Friends and family. Your religious community. Other people with cancer. Community-based or online support groups. Where to find more information National Cancer Institute: www.cancer.gov American Cancer Society: www.cancer.org Contact a health care provider if: You bleed or bruise more often. You notice blood in your urine or stool. You have any of these symptoms: A skin rash, or dry or itchy skin. A headache or stiff neck. Cold or flu symptoms. A cough. Persistent nausea or vomiting. Persistent diarrhea. Frequent urination, burning when passing urine, or foul-smelling urine. You cannot eat  because of mouth or throat pain. You are sad, confused, anxious, or depressed. Get help right away if: You have any of these symptoms: A fever or chills. Your health care provider should know about this right away. Redness, swelling, pain, fluid, or warmth near an IV site. Bleeding that you cannot stop. A seizure. You cannot swallow. You have chest pain. You have trouble breathing. A family member or caregiver should get help right away if you have a sudden or unusual change in behavior. These symptoms may be an emergency. Get help right away. Call 911. Do not wait to see if the symptoms will go away. Do not drive yourself to the hospital. Summary Chemotherapy is a treatment that uses medicine to kill cancer cells and can cause side effects. The specific side effects depend on the specific medicines used. Learn as much as you can about your condition. Ask about side effects to watch for and how to treat them. Seek out support and resources from others. Find out what family support resources are available from your cancer treatment center. Let your health care provider know if you notice any new, unusual, or worsening symptoms, especially fever or chills. This information is not intended to replace advice given to you by your health care provider. Make sure you discuss any questions you have with your health   care provider. Document Revised: 01/22/2021 Document Reviewed: 01/22/2021 Elsevier Patient Education  2024 [employer]  

## 2022-08-01 ENCOUNTER — Encounter: Payer: Self-pay | Admitting: Oncology

## 2022-08-03 NOTE — Progress Notes (Signed)
[facility]  [address] [place],  [place]  [address] [phone]  Clinic Day: 08/05/22  Referring physician: Philemon Kingdom, MD  ASSESSMENT & PLAN:  Gastric adenocarcinoma This is a poorly differentiated adenocarcinoma with signet ring features and ulceration. Stain for HER2 was negative at 0. I would consider it as metastatic to the omentum and so that would stage this as a T4 N0 M1, stage IVB.  She has been on treatment with chemotherapy with FOLFOX and nivolumab for 10 cycles with very good response, but then had increasing dermatologic toxicities and neuropathy. Her PET scan from 03/22/2022 shows a new area of hypermetabolic activity in the right lower lobe of peripheral consolidation, which is likely atelectasis or resolving pneumonia measuring 3cm. The thickening of her stomach has decreased with decreased metabolic activity and there is shrinkage of the omental metastasis from 22 mm to 13 mm. The last time she received chemotherapy alone she had severe rash and fever and was admitted to the hospital, but she was also found to have pneumonia and a UTI at that time. We are not sure which medication caused her rashes. Dr. Jennye Boroughs did a EGD on March, 5th and found persistent poorly differentiated adenocarcinoma and a 3 cm mass of the distal body of the stomach. Since her current treatment is palliating her disease, we will continue.    Immune Mediated Nephritis Her creatinine is improved with normal saline IV and she was encouraged to drink more fluids. Renal ultrasound was normal. We felt this was caused by her immunotherapy and placed her on Prednisone for a short course and low dose, which was effective. I tapered her off completely as of the end of April. Her creatinine went up a little bit last time to 1.27 and then 1.5 two weeks ago. I therefore gave her IV fluids and rechecked last week and it was down to 1.2 and remains stable today at 1.3. We  will continue IV fluids with her chemotherapy treatments.     Plan She asked if she can take tylenol and I approved but reminded her to not take Aleve, Advil, ibuprofen, or motrin. I even recommended Voltaren gel. Patient asked if she can have her teeth cleaned and her last cleaning was almost a year ago. I advised her to wait unless she is having any problems. She has seen a podiatrist, was treated and recommended to come back every 3-4 months for care of her toenails. Her WBC is 6.7, hemoglobin 12.5, and platelet count 199,000 as of today. Her BUN improved from 25 to 21, creatinine from 1.3 to 1.2 and her liver function tests are normal. She continues to increase fluids. Her day 1 cycle 18 of FOLFOX and Nivolumab is scheduled on 08/09/2022 and her day 3 pump D/C, cycle 18 is scheduled on 08/10/2022. She will be due for repeat scans in July. Her daughter informed me that from July 2-7th they will be out of town. [name] on the 8th and treat her that week. She will have scans done the following week and then we will see her a week after to get her back on schedule.   I reviewed all of this with her and her daughter and answered their questions. She understands and agrees with this plan   I provided 23 minutes of face-to-face time during this this encounter and > 50% was spent counseling as documented under my assessment and plan.   Dellia Beckwith, MD [place]  [facility] AT [facility] [address] [place] [address] Dept: [phone] Dept Fax: [phone]     CHIEF COMPLAINT:  CC: Gastric cancer  Current Treatment: Chemotherapy/immunotherapy  HISTORY OF PRESENT ILLNESS:  MAKALYNN KAAKE is a 87 y.o. female with a history of gastric cancer who is referred in consultation with Dr. Shara Blazing for assessment and management.  She had noticed that she was having regurgitation when eating and had lost over 30 pounds.  An ultrasound was  done, revealing hepatic steatosis, and led to an MRI scan on June 5 which revealed gastric wall thickening with confluent nodularity of the omentum anterior to the stomach measuring 3.5 cm consistent with metastatic tumor.  She also had low-grade edema and wall thickening extending into the duodenum from the stomach.  She was referred to Dr. Shara Blazing and he did an EGD on July 7.  This revealed a large ulceration measuring 1.2 cm along the greater curvature.  She also had diffusely edematous and erythematous wall with erosions of the antrum and stiff and friable mucosa with oozing of blood.  These findings extended to the gastric fundus as well.  Pathology revealed a poorly differentiated adenocarcinoma with signet ring features from the biopsies of the ulcer as well as the antrum and the fundus of the stomach.  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  She was placed on omeprazole.  Her test for H. pylori was negative.  She was referred to Dr. Hardin Negus for consideration of surgery but he felt this was not resectable because of the extensive involvement and I agree.  I would consider this extending to the duodenum and also metastatic to the omentum.  PET scan confirmed these findings and she wished to pursue systemic intravenous therapy.  She continues to eat and has adjusted her diet to softer foods and liquids and is drinking boost so she has maintained her weight.  She does have some Zofran ODT for nausea when needed.  She has had some anorexia, nausea, occasional vomiting, early satiety, dysphagia, and belching.  A CEA and CA 19-9 were normal. She has been started on FOLFOX chemotherapy along with immunotherapy.  I have reviewed her chart and materials related to her cancer extensively and collaborated history with the patient. Summary of oncologic history is as follows: Oncology History  Gastric cancer (HCC)  09/10/2021 Initial Diagnosis    Gastric cancer (HCC)   09/10/2021 Cancer Staging   Staging form: Stomach, AJCC 8th Edition - Clinical stage from 09/10/2021: Stage IVB (cT4b, cN0, cM1) - Signed by Dellia Beckwith, MD on 09/10/2021 Histopathologic type: Adenocarcinoma, NOS Stage prefix: Initial diagnosis Total positive nodes: 0 Histologic grade (G): G3 Histologic grading system: 3 grade system Sites of metastasis: Peritoneal surface Diagnostic confirmation: Positive histology PLUS positive immunophenotyping and/or positive genetic studies Specimen type: Endoscopy with Biopsy Staged by: Managing physician Carcinoembryonic antigen (CEA) (ng/mL): 2.8 Carbohydrate antigen 19-9 (CA 19-9) (U/mL): 4.9 HER2 status: Unknown Microsatellite instability (MSI): Unknown Tumor location in stomach: Other Clinical staging modalities: Biopsy, Endoscopy Stage used in treatment planning: Yes National guidelines used in treatment planning: Yes Type of national guideline used in treatment planning: NCCN   09/28/2021 - 10/14/2021 Chemotherapy   Patient is on Treatment Plan : GASTROESOPHAGEAL FOLFOX + Nivolumab q14d     09/28/2021 -  Chemotherapy   Patient is on Treatment Plan : GASTROESOPHAGEAL FOLFOX + Nivolumab q14d     12/09/2021  Genetic Testing   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  Report date is 12/09/2021.   The Multi-Cancer + RNA Panel offered by [facility] includes sequencing and/or deletion/duplication analysis of the following 84 genes:  AIP*, ALK, APC*, ATM*, AXIN2*, BAP1*, BARD1*, BLM*, BMPR1A*, BRCA1*, BRCA2*, BRIP1*, CASR, CDC73*, CDH1*, CDK4, CDKN1B*, CDKN1C*, CDKN2A, CEBPA, CHEK2*, CTNNA1*, DICER1*, DIS3L2*, EGFR, EPCAM, FH*, FLCN*, GATA2*, GPC3, GREM1, HOXB13, HRAS, KIT, MAX*, MEN1*, MET, MITF, MLH1*, MSH2*, MSH3*, MSH6*, MUTYH*, NBN*, NF1*, NF2*, NTHL1*, PALB2*, PDGFRA, PHOX2B, PMS2*, POLD1*, POLE*, POT1*, PRKAR1A*, PTCH1*, PTEN*, RAD50*, RAD51C*, RAD51D*, RB1*, RECQL4, RET,  RUNX1*, SDHA*, SDHAF2*, SDHB*, SDHC*, SDHD*, SMAD4*, SMARCA4*, SMARCB1*, SMARCE1*, STK11*, SUFU*, TERC, TERT, TMEM127*, Tp53*, TSC1*, TSC2*, VHL*, WRN*, and WT1.  RNA analysis is performed for * genes.     INTERVAL HISTORY:  Caliyah is here today for a follow-up for her gastric cancer, she continues maintenance treatment with 5FU, Leucovorin, and Nivolumab. Patient states that she feels well and complains of a constant burning pain in the middle of left shoulder. She asked if she can take tylenol and I approved, but reminded her to not take Aleve, Advil, ibuprofen, or motrin. I even recommended Voltaren gel. Patient asked if she can have her teeth cleaned and her last cleaning was almost a year ago. I advised her to wait unless she is having any problems. She has seen a podiatrist, was treated and recommended to come back every 3-4 months for care of her toenails. Her WBC is 6.7, hemoglobin 12.5, and platelet count 199,000 as of today. Her BUN improved from 25 to 21, creatinine from 1.3 to 1.2 and her liver function tests are normal. She continues to increase fluids. Her day 1 cycle 18 of FOLFOX and Nivolumab is scheduled on 08/09/2022 and her day 3 pump D/C, cycle 18 is scheduled on 08/10/2022. She will be due for repeat scans in July. Her daughter informed me that from July 2-7th they will be out of town. [name] on the 8th and treat her that week. She will have scans done the following week and then we will see her a week after to get her back on schedule. She denies signs of infection such as sore throat, sinus drainage, cough, or urinary symptoms.  She denies fevers or recurrent chills. She denies pain. She denies nausea, vomiting, chest pain, dyspnea or cough. Her appetite is good and her weight has decreased 1 pounds over last week .She is accompanied at today's visit by her daughter.   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  If that happens, she is to call us so that we can schedule her for an ultrasound to look for synovitis.     Essential hypertension 09/09/2016   Gastric cancer (HCC) 09/10/2021   Hyperlipidemia 09/01/2016   Hypertension    Hypothyroidism 09/01/2016   Osteoarthritis of both feet 01/20/2016   Osteoarthritis, hand 01/20/2016   Thyroid disease   Degenerative disc  disease History of endometriosis  Past Surgical History:  Procedure Laterality Date   APPENDECTOMY     LAPAROSCOPIC APPENDECTOMY N/A 04/10/2016   Procedure: APPENDECTOMY LAPAROSCOPIC;  Surgeon: Abigail Miyamoto, MD;  Location: [facility] OR;  Service: General;  Laterality: N/A;  Bilateral tubal ligation Total hysterectomy and bilateral salpingo-oophorectomy in 1980  Family History  Problem Relation Age of Onset   Hypertension Mother    Prostate cancer Father        metastatic; d. 63   Brain cancer Sister 27   Breast cancer Sister 49   AAA (abdominal aortic aneurysm) Brother    Leukemia Cousin        x2 maternal female cousins; d. before 27   Breast cancer Daughter 66       DCIS  Her sister has had breast cancer as well as a tumor of her head Her daughter has had breast cancer last year  Social History:  reports that she has never smoked. She has never used smokeless tobacco. She reports that she does not currently use alcohol. She reports that she does not use drugs.The patient is accompanied by her son, daughter-in-law and daughter today.  She is single but lives with a  significant other.  She has the 2 children.  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  She grew up in [place] and [place].  She is active and healthy, especially for her age.  Allergies:  Allergies  Allergen Reactions   Doxycycline Rash   Sulfa Antibiotics Rash and Other (See Comments)    Other reaction(s): Other (See Comments)  "Made me feel weird"    Current Medications: Current Outpatient Medications  Medication Sig Dispense Refill   aspirin 81 MG EC tablet Take 81 mg by mouth daily. Swallow whole.     b complex vitamins capsule Take 1 capsule by mouth daily.     Calcium Carbonate (CALCIUM 600 PO) Take 1 tablet by mouth daily.     Cholecalciferol (VITAMIN D3) 5000 units CAPS Take 1 capsule by mouth daily.     EXFORGE HCT 5-160-12.5 MG TABS Take 1 tablet by mouth daily.     famotidine (PEPCID) 40 MG tablet Take 40 mg by mouth at bedtime.     KRILL OIL PO Take 1 capsule by mouth daily. Unknown strenght     Lactobacillus TABS Take 1 tablet by mouth 2 (two) times daily.     levothyroxine (SYNTHROID, LEVOTHROID) 75 MCG tablet Take 75 mcg by mouth daily before breakfast.      magic mouthwash SOLN SWISH AND SWALLOW BY MOUTH EVERY THREE TO FOUR HOURS     NON FORMULARY MMW: 3 parts Maalox 2 parts Benadryl 1 part viscious lidicaine  Disp. 6oz  Instructions: 5ml swish and swallow every 3-4 hours     omeprazole (PRILOSEC) 40 MG capsule Take 1 capsule (40 mg total) by mouth 2 (two) times daily. 60 capsule 5   ondansetron (ZOFRAN-ODT) 4 MG disintegrating tablet Take 1 tablet (4 mg total) by mouth every 8 (eight) hours as needed for nausea or vomiting. 90 tablet 0   polyethylene glycol powder (GLYCOLAX/MIRALAX) 17 GM/SCOOP powder SMARTSIG:1 scoopful By Mouth Daily     potassium chloride SA (KLOR-CON M) 20 MEQ tablet TAKE ONE TABLET BY MOUTH THREE TIMES DAILY 90 tablet 5   pravastatin (PRAVACHOL) 20 MG tablet Take 1 tablet (20 mg total) by mouth every evening. 90  tablet 3   predniSONE (DELTASONE) 10 MG tablet Take 2  tablets (20 mg total) by mouth 2 (two) times daily with a meal. (Patient taking differently: Take 10 mg by mouth 2 (two) times daily with a meal. One 10mg  tablet twice a day) 100 tablet 1   Probiotic Product (PROBIOTIC DAILY PO) Take 1 tablet by mouth daily.     prochlorperazine (COMPAZINE) 10 MG tablet Take 1 tablet (10 mg total) by mouth every 6 (six) hours as needed for nausea or vomiting. 90 tablet 3   triamcinolone 0.1% oint-Eucerin equivalent cream 1:1 mixture Apply topically 2 (two) times daily. 160 g 0   triamcinolone cream (KENALOG) 0.1 % Apply to affected skin twice daily (Patient taking differently: daily as needed. Apply to affected skin twice daily) 160 g 0   No current facility-administered medications for this visit.   REVIEW OF SYSTEMS:  Review of Systems  Constitutional: Negative.  Negative for appetite change, chills, diaphoresis, fatigue, fever and unexpected weight change.  HENT:   Negative for hearing loss, lump/mass, mouth sores, nosebleeds, sore throat, tinnitus, trouble swallowing and voice change.   Eyes:  Negative for eye problems and icterus.  Respiratory: Negative.  Negative for chest tightness, cough, hemoptysis, shortness of breath and wheezing.   Cardiovascular: Negative.  Negative for chest pain, leg swelling and palpitations.  Gastrointestinal: Negative.  Negative for abdominal distention, abdominal pain, blood in stool, constipation, diarrhea, nausea, rectal pain and vomiting.  Endocrine: Negative.  Negative for hot flashes.  Genitourinary: Negative.  Negative for bladder incontinence, difficulty urinating, dyspareunia, dysuria, frequency, hematuria, menstrual problem, nocturia, pelvic pain, vaginal bleeding and vaginal discharge.   Musculoskeletal: Negative.  Negative for arthralgias, back pain, flank pain, gait problem, myalgias, neck pain and neck stiffness.       Left shoulder pain in the scapula area.  (Constant and burning)  Skin: Negative.  Negative for itching, rash and wound.       Occasional blotchy discoloration of her lower extremities, but much improved  Neurological:  Positive for numbness (in her fingers and toes). Negative for dizziness, extremity weakness, gait problem, headaches, light-headedness, seizures and speech difficulty.  Hematological:  Negative for adenopathy. Bruises/bleeds easily.  Psychiatric/Behavioral: Negative.  Negative for confusion, decreased concentration, depression, sleep disturbance and suicidal ideas. The patient is not nervous/anxious.    VITALS:  Blood pressure (!) 143/66, pulse 90, temperature 97.8 F (36.6 C), temperature source Oral, resp. rate 16, height 5' (1.524 m), weight 201 lb 6.4 oz (91.4 kg), SpO2 96 %.  Wt Readings from Last 3 Encounters:  08/11/22 203 lb 0.6 oz (92.1 kg)  08/09/22 203 lb 1.3 oz (92.1 kg)  08/05/22 201 lb 6.4 oz (91.4 kg)    Body mass index is 39.33 kg/m.  Performance status (ECOG): 1 - Symptomatic but completely ambulatory  PHYSICAL EXAM:  Physical Exam Vitals and nursing note reviewed. Exam conducted with a chaperone present.  Constitutional:      General: She is not in acute distress.    Appearance: Normal appearance. She is normal weight. She is not ill-appearing, toxic-appearing or diaphoretic.  HENT:     Head: Normocephalic and atraumatic.     Right Ear: Tympanic membrane, ear canal and external ear normal. There is no impacted cerumen.     Left Ear: Tympanic membrane, ear canal and external ear normal. There is no impacted cerumen.     Nose: Nose normal. No congestion or rhinorrhea.     Mouth/Throat:     Mouth: Mucous membranes are moist.     Pharynx: Oropharynx is clear.  No oropharyngeal exudate or posterior oropharyngeal erythema.  Eyes:     General: No scleral icterus.       Right eye: No discharge.        Left eye: No discharge.     Extraocular Movements: Extraocular movements intact.      Conjunctiva/sclera: Conjunctivae normal.     Pupils: Pupils are equal, round, and reactive to light.  Neck:     Vascular: No carotid bruit.  Cardiovascular:     Rate and Rhythm: Normal rate and regular rhythm.     Pulses: Normal pulses.     Heart sounds: Normal heart sounds. No murmur heard.    No friction rub. No gallop.  Pulmonary:     Effort: Pulmonary effort is normal. No respiratory distress.     Breath sounds: Normal breath sounds. No stridor. No wheezing, rhonchi or rales.  Chest:     Chest wall: No tenderness.  Abdominal:     General: Bowel sounds are normal. There is no distension.     Palpations: Abdomen is soft. There is no hepatomegaly, splenomegaly or mass.     Tenderness: There is no abdominal tenderness. There is no right CVA tenderness, left CVA tenderness, guarding or rebound.     Hernia: No hernia is present.     Comments: Slight firmness in the epigastrium with deep palpation.   Musculoskeletal:        General: No swelling, tenderness, deformity or signs of injury.     Left shoulder: Normal range of motion.     Cervical back: Normal range of motion and neck supple. No rigidity or tenderness.     Right lower leg: 1+ Edema present.     Left lower leg: 1+ Edema present.  Lymphadenopathy:     Cervical: No cervical adenopathy.     Right cervical: No superficial, deep or posterior cervical adenopathy.    Left cervical: No superficial, deep or posterior cervical adenopathy.     Upper Body:     Right upper body: No supraclavicular, axillary or pectoral adenopathy.     Left upper body: No supraclavicular, axillary or pectoral adenopathy.  Skin:    General: Skin is warm and dry.     Coloration: Skin is not jaundiced or pale.     Findings: No bruising, erythema, lesion or rash.     Comments: Rash on her legs is faded and nearly resolved  Neurological:     General: No focal deficit present.     Mental Status: She is alert and oriented to person, place, and time. Mental  status is at baseline.     Cranial Nerves: No cranial nerve deficit.     Sensory: No sensory deficit.     Motor: No weakness.     Coordination: Coordination normal.     Gait: Gait normal.     Deep Tendon Reflexes: Reflexes normal.  Psychiatric:        Mood and Affect: Mood normal.        Behavior: Behavior normal.        Thought Content: Thought content normal.        Judgment: Judgment normal.    LABS:      Latest Ref Rng & Units 08/05/2022   12:00 AM 07/22/2022   12:00 AM 07/08/2022   12:00 AM  CBC  WBC  6.7     9.4     6.6      Hemoglobin 12.0 - 16.0 12.5     12.1  11.7      Hematocrit 36 - 46 36     34     33      Platelets 150 - 400 K/uL 199     219     203         This result is from an external source.      Latest Ref Rng & Units 08/05/2022   12:00 AM 07/22/2022   12:00 AM 07/08/2022   12:00 AM  CMP  BUN 4 - 21 21     25     19       Creatinine 0.5 - 1.1 1.2     1.3     1.3      Sodium 137 - 147 138     139     138      Potassium 3.5 - 5.1 mEq/L 3.9     3.8     3.8      Chloride 99 - 108 105     106     108      CO2 13 - 22 23     19     24       Calcium 8.7 - 10.7 9.8     9.4     9.2      Alkaline Phos 25 - 125 66     63     69      AST 13 - 35 31     28     24       ALT 7 - 35 U/L 19     17     16          This result is from an external source.   Lab Results  Component Value Date   CEA1 2.9 09/10/2021   /  CEA  Date Value Ref Range Status  09/10/2021 2.9 0.0 - 4.7 ng/mL Final    Comment:    (NOTE)                             Nonsmokers          <3.9                             Smokers             <5.6 Roche [employer] Electrochemiluminescence Immunoassay (ECLIA) Values obtained with different assay methods or kits cannot be used interchangeably.  Results cannot be interpreted as absolute evidence of the presence or absence of malignant disease. Performed At: [facility] [address] [place], [place] [id number] Jolene Schimke MD  NF:[id number]    Component Ref Range & Units 12 d ago (07/22/22) 3 wk ago (07/08/22) 1 mo ago (06/25/22) 1 mo ago (06/10/22) 2 mo ago (05/27/22) 3 mo ago (04/29/22) 3 mo ago (04/15/22)  T4, Total 4.5 - 12.0 ug/dL 46.9 62.9 CM 52.8 CM 41.3 CM 9.6 CM 7.6 CM 10.9 CM   Component Ref Range & Units 12 d ago (07/22/22) 3 wk ago (07/08/22) 1 mo ago (06/25/22) 1 mo ago (06/10/22) 2 mo ago (05/27/22) 3 mo ago (04/29/22) 3 mo ago (04/15/22)  TSH 0.350 - 4.500 uIU/mL 2.035 2.103 CM 2.455 CM 2.595 CM 0.990 CM 0.410 CM 0.892 CM     Component Ref Range & Units 06/10/22  Vitamin B-12 180 - 914 pg/mL 302   Component Ref  Range & Units 06/10/22 2 yr ago 3 yr ago  Cholesterol 0 - 200 mg/dL 161    Triglycerides <096 mg/dL 045 409 R 811 High  R  HDL >40 mg/dL 77 64 R 62 R  Total CHOL/HDL Ratio RATIO 2.3 2.8 R, CM 3.3 R, CM  VLDL 0 - 40 mg/dL 29    LDL Cholesterol 0 - 99 mg/dL 70      No results found for: "PSA1" No results found for: "BJY782" No results found for: "CAN125"  No results found for: "TOTALPROTELP", "ALBUMINELP", "A1GS", "A2GS", "BETS", "BETA2SER", "GAMS", "MSPIKE", "SPEI" No results found for: "TIBC", "FERRITIN", "IRONPCTSAT" No results found for: "LDH"  STUDIES:  Echocardiogram: 07/02/2022 IMPRESSIONS:  1. Left ventricular ejection fraction, by estimation, is 60 to 65%. The  left ventricle has normal function. The left ventricle has no regional  wall motion abnormalities. Left ventricular diastolic parameters are  consistent with Grade I diastolic  dysfunction (impaired relaxation).GLS-18.3%   2. Right ventricular systolic function is normal. The right ventricular  size is normal. There is normal pulmonary artery systolic pressure.   3. Left atrial size was mildly dilated.   4. The mitral valve is normal in structure. Moderate mitral valve  regurgitation. No evidence of mitral stenosis.   5. The aortic valve is normal in structure. Aortic valve regurgitation is  not  visualized. No aortic stenosis is present.   6. The inferior vena cava is normal in size with greater than 50%  respiratory variability, suggesting right atrial pressure of 3 mmHg.   EXAM: 03/22/2022 NUCLEAR MEDICINE PET SKULL BASE TO THIGH IMPRESSION: 1. Resolution of metabolic activity associated with the stomach. 2. Decrease in size of omental nodularity. No associated metabolic activity. 3. No evidence of new or progressive gastric carcinoma. 4. New peripheral consolidation in the RIGHT lower lobe with moderate metabolic activity. Favor focus of atelectasis versus less likely pneumonia. 5.  Aortic Atherosclerosis (ICD10-I70.0).     I,Jasmine M Lassiter,acting as a scribe for Dellia Beckwith, MD.,have documented all relevant documentation on the behalf of Dellia Beckwith, MD,as directed by  Dellia Beckwith, MD while in the presence of Dellia Beckwith, MD.

## 2022-08-04 ENCOUNTER — Ambulatory Visit (INDEPENDENT_AMBULATORY_CARE_PROVIDER_SITE_OTHER): Payer: Medicare Other | Admitting: Podiatry

## 2022-08-04 DIAGNOSIS — M79674 Pain in right toe(s): Secondary | ICD-10-CM | POA: Diagnosis not present

## 2022-08-04 DIAGNOSIS — M79675 Pain in left toe(s): Secondary | ICD-10-CM | POA: Diagnosis not present

## 2022-08-04 DIAGNOSIS — G609 Hereditary and idiopathic neuropathy, unspecified: Secondary | ICD-10-CM

## 2022-08-04 DIAGNOSIS — B351 Tinea unguium: Secondary | ICD-10-CM

## 2022-08-04 NOTE — Progress Notes (Signed)
    Subjective:  Patient ID: Mackenzie Lewis, female    DOB: 18-Jun-1935,  MRN: [id number]   Mackenzie Lewis presents to clinic today for:  Chief Complaint  Patient presents with   Peripheral Neuropathy    Numbness, and painful to walk to bilateral feet. Feels like walking on rocks. Patient does not take any medications for neuropathy.    Nail Problem    White discoloration to nails bilateral feet. Patient is currently getting chemotherapy.    Patient notes nails are thick, discolored, elongated and painful in shoegear when trying to ambulate.  Patient is currently undergoing chemotherapy treatments every 2 weeks.  States that this will be an ongoing issue for her for the rest of her life.  She also notes that she has neuropathy in her feet after having an allergic reaction to one of her medications in the past.  The neuropathy consists of complete numbness.  Denies pain or burning.  She states that it also has a mild effect on her balance but she has not had any recent falls.  PCP is Philemon Kingdom, MD.  Allergies  Allergen Reactions   Doxycycline Rash   Sulfa Antibiotics Rash and Other (See Comments)    Other reaction(s): Other (See Comments)  "Made me feel weird"   Review of Systems: Negative except as noted in the HPI.  Objective:  There were no vitals filed for this visit.  Mackenzie Lewis is a pleasant 87 y.o. female in NAD. AAO x 3.  Vascular Examination: Capillary refill time is 3-5 seconds to toes bilateral. Palpable pedal pulses b/l LE. Digital hair present b/l. No pedal edema b/l. Skin temperature gradient WNL b/l. No varicosities b/l. No cyanosis or clubbing noted b/l.   Dermatological Examination: Pedal skin with normal turgor, texture and tone b/l. No open wounds. No interdigital macerations b/l. Toenails x10 are 3mm thick, discolored, dystrophic with subungual debris. There is pain with compression of the nail plates.  They are elongated x10  Neurological  Examination: Protective sensation diminished bilateral LE. Vibratory sensation diminished bilateral LE.  Light touch sensation absent to forefoot bilateral  Assessment/Plan: 1. Pain due to onychomycosis of toenails of both feet   2. Idiopathic peripheral neuropathy    The mycotic toenails were sharply debrided x10 with sterile nail nippers and a power debriding burr to decrease bulk/thickness and length.    Return in about 3 months (around 11/04/2022) for RFC.   Clerance Lav, DPM, FACFAS [facility]     [address] [place], [place] [address]                Office [phone]  Fax [phone]

## 2022-08-05 ENCOUNTER — Inpatient Hospital Stay: Payer: Medicare Other | Admitting: Oncology

## 2022-08-05 ENCOUNTER — Inpatient Hospital Stay: Payer: Medicare Other

## 2022-08-05 ENCOUNTER — Other Ambulatory Visit: Payer: Self-pay

## 2022-08-05 ENCOUNTER — Encounter: Payer: Self-pay | Admitting: Oncology

## 2022-08-05 VITALS — BP 143/66 | HR 90 | Temp 97.8°F | Resp 16 | Ht 60.0 in | Wt 201.4 lb

## 2022-08-05 DIAGNOSIS — C169 Malignant neoplasm of stomach, unspecified: Secondary | ICD-10-CM | POA: Diagnosis not present

## 2022-08-05 DIAGNOSIS — C168 Malignant neoplasm of overlapping sites of stomach: Secondary | ICD-10-CM

## 2022-08-05 DIAGNOSIS — Z79899 Other long term (current) drug therapy: Secondary | ICD-10-CM | POA: Diagnosis not present

## 2022-08-05 DIAGNOSIS — D649 Anemia, unspecified: Secondary | ICD-10-CM | POA: Diagnosis not present

## 2022-08-05 DIAGNOSIS — Z5111 Encounter for antineoplastic chemotherapy: Secondary | ICD-10-CM | POA: Diagnosis not present

## 2022-08-05 LAB — COMPREHENSIVE METABOLIC PANEL (ASHBORO CC SCANNED REPORT)

## 2022-08-05 LAB — HEPATIC FUNCTION PANEL
ALT: 19 U/L (ref 7–35)
AST: 31 (ref 13–35)
Alkaline Phosphatase: 66 (ref 25–125)
Bilirubin, Total: 0.7

## 2022-08-05 LAB — BASIC METABOLIC PANEL
BUN: 21 (ref 4–21)
CO2: 23 — AB (ref 13–22)
Chloride: 105 (ref 99–108)
Creatinine: 1.2 — AB (ref 0.5–1.1)
EGFR (Non-African Amer.): 43
Glucose: 153
Potassium: 3.9 mEq/L (ref 3.5–5.1)
Sodium: 138 (ref 137–147)

## 2022-08-05 LAB — CBC AND DIFFERENTIAL
HCT: 36 (ref 36–46)
Hemoglobin: 12.5 (ref 12.0–16.0)
Neutrophils Absolute: 3.35
Platelets: 199 10*3/uL (ref 150–400)
WBC: 6.7

## 2022-08-05 LAB — TSH: TSH: 2.443 u[IU]/mL (ref 0.350–4.500)

## 2022-08-05 LAB — COMPREHENSIVE METABOLIC PANEL
Albumin: 4.3 (ref 3.5–5.0)
Calcium: 9.8 (ref 8.7–10.7)

## 2022-08-05 LAB — CBC W DIFFERENTIAL (~~LOC~~ CC SCANNED REPORT)

## 2022-08-05 LAB — CBC: RBC: 3.87 (ref 3.87–5.11)

## 2022-08-06 ENCOUNTER — Other Ambulatory Visit: Payer: Self-pay | Admitting: Oncology

## 2022-08-06 DIAGNOSIS — G609 Hereditary and idiopathic neuropathy, unspecified: Secondary | ICD-10-CM | POA: Insufficient documentation

## 2022-08-06 DIAGNOSIS — B351 Tinea unguium: Secondary | ICD-10-CM | POA: Insufficient documentation

## 2022-08-06 MED FILL — Leucovorin Calcium For Inj 350 MG: INTRAMUSCULAR | Qty: 31.5 | Status: AC

## 2022-08-06 MED FILL — Fluorouracil IV Soln 2.5 GM/50ML (50 MG/ML): INTRAVENOUS | Qty: 13 | Status: AC

## 2022-08-06 MED FILL — Dexamethasone Sodium Phosphate Inj 100 MG/10ML: INTRAMUSCULAR | Qty: 1 | Status: AC

## 2022-08-06 MED FILL — Fluorouracil IV Soln 5 GM/100ML (50 MG/ML): INTRAVENOUS | Qty: 77 | Status: AC

## 2022-08-06 NOTE — Progress Notes (Signed)
Continue 5FU CIV 3850mg  per MD.  Richardean Sale, RPH, BCPS, BCOP 08/06/2022 10:28 AM

## 2022-08-07 LAB — T4: T4, Total: 11.5 ug/dL (ref 4.5–12.0)

## 2022-08-08 ENCOUNTER — Encounter: Payer: Self-pay | Admitting: Oncology

## 2022-08-09 ENCOUNTER — Inpatient Hospital Stay: Payer: Medicare Other

## 2022-08-09 VITALS — BP 150/68 | HR 92 | Temp 98.8°F | Resp 14 | Ht 60.0 in | Wt 203.1 lb

## 2022-08-09 DIAGNOSIS — Z79899 Other long term (current) drug therapy: Secondary | ICD-10-CM | POA: Diagnosis not present

## 2022-08-09 DIAGNOSIS — Z5111 Encounter for antineoplastic chemotherapy: Secondary | ICD-10-CM | POA: Diagnosis not present

## 2022-08-09 DIAGNOSIS — C169 Malignant neoplasm of stomach, unspecified: Secondary | ICD-10-CM | POA: Diagnosis not present

## 2022-08-09 DIAGNOSIS — C168 Malignant neoplasm of overlapping sites of stomach: Secondary | ICD-10-CM

## 2022-08-09 MED ORDER — SODIUM CHLORIDE 0.9 % IV SOLN
240.0000 mg | Freq: Once | INTRAVENOUS | Status: AC
  Administered 2022-08-09: 240 mg via INTRAVENOUS
  Filled 2022-08-09: qty 24

## 2022-08-09 MED ORDER — SODIUM CHLORIDE 0.9 % IV SOLN
3850.0000 mg | INTRAVENOUS | Status: DC
  Administered 2022-08-09: 3850 mg via INTRAVENOUS
  Filled 2022-08-09: qty 77

## 2022-08-09 MED ORDER — SODIUM CHLORIDE 0.9 % IV SOLN
10.0000 mg | Freq: Once | INTRAVENOUS | Status: AC
  Administered 2022-08-09: 10 mg via INTRAVENOUS
  Filled 2022-08-09: qty 10

## 2022-08-09 MED ORDER — FLUOROURACIL CHEMO INJECTION 2.5 GM/50ML
320.0000 mg/m2 | Freq: Once | INTRAVENOUS | Status: AC
  Administered 2022-08-09: 650 mg via INTRAVENOUS
  Filled 2022-08-09: qty 13

## 2022-08-09 MED ORDER — LEUCOVORIN CALCIUM INJECTION 350 MG
320.0000 mg/m2 | Freq: Once | INTRAVENOUS | Status: AC
  Administered 2022-08-09: 630 mg via INTRAVENOUS
  Filled 2022-08-09: qty 31.5

## 2022-08-09 MED ORDER — PALONOSETRON HCL INJECTION 0.25 MG/5ML
0.2500 mg | Freq: Once | INTRAVENOUS | Status: AC
  Administered 2022-08-09: 0.25 mg via INTRAVENOUS
  Filled 2022-08-09: qty 5

## 2022-08-09 MED ORDER — DEXTROSE 5 % IV SOLN: Freq: Once | INTRAVENOUS | Status: AC

## 2022-08-09 MED ORDER — SODIUM CHLORIDE 0.9 % IV SOLN: Freq: Once | INTRAVENOUS | Status: AC

## 2022-08-09 NOTE — Patient Instructions (Signed)
Fluorouracil Injection What is this medication? FLUOROURACIL (flure oh YOOR a sil) treats some types of cancer. It works by slowing down the growth of cancer cells. This medicine may be used for other purposes; ask your health care provider or pharmacist if you have questions. COMMON BRAND NAME(S): Adrucil What should I tell my care team before I take this medication? They need to know if you have any of these conditions: Blood disorders Dihydropyrimidine dehydrogenase (DPD) deficiency Infection, such as chickenpox, cold sores, herpes Kidney disease Liver disease Poor nutrition Recent or ongoing radiation therapy An unusual or allergic reaction to fluorouracil, other medications, foods, dyes, or preservatives If you or your partner are pregnant or trying to get pregnant Breast-feeding How should I use this medication? This medication is injected into a vein. It is administered by your care team in a hospital or clinic setting. Talk to your care team about the use of this medication in children. Special care may be needed. Overdosage: If you think you have taken too much of this medicine contact a poison control center or emergency room at once. NOTE: This medicine is only for you. Do not share this medicine with others. What if I miss a dose? Keep appointments for follow-up doses. It is important not to miss your dose. Call your care team if you are unable to keep an appointment. What may interact with this medication? Do not take this medication with any of the following: Live virus vaccines This medication may also interact with the following: Medications that treat or prevent blood clots, such as warfarin, enoxaparin, dalteparin This list may not describe all possible interactions. Give your health care provider a list of all the medicines, herbs, non-prescription drugs, or dietary supplements you use. Also tell them if you smoke, drink alcohol, or use illegal drugs. Some items may  interact with your medicine. What should I watch for while using this medication? Your condition will be monitored carefully while you are receiving this medication. This medication may make you feel generally unwell. This is not uncommon as chemotherapy can affect healthy cells as well as cancer cells. Report any side effects. Continue your course of treatment even though you feel ill unless your care team tells you to stop. In some cases, you may be given additional medications to help with side effects. Follow all directions for their use. This medication may increase your risk of getting an infection. Call your care team for advice if you get a fever, chills, sore throat, or other symptoms of a cold or flu. Do not treat yourself. Try to avoid being around people who are sick. This medication may increase your risk to bruise or bleed. Call your care team if you notice any unusual bleeding. Be careful brushing or flossing your teeth or using a toothpick because you may get an infection or bleed more easily. If you have any dental work done, tell your dentist you are receiving this medication. Avoid taking medications that contain aspirin, acetaminophen, ibuprofen, naproxen, or ketoprofen unless instructed by your care team. These medications may hide a fever. Do not treat diarrhea with over the counter products. Contact your care team if you have diarrhea that lasts more than 2 days or if it is severe and watery. This medication can make you more sensitive to the sun. Keep out of the sun. If you cannot avoid being in the sun, wear protective clothing and sunscreen. Do not use sun lamps, tanning beds, or tanning booths. Talk to  your care team if you or your partner wish to become pregnant or think you might be pregnant. This medication can cause serious birth defects if taken during pregnancy and for 3 months after the last dose. A reliable form of contraception is recommended while taking this  medication and for 3 months after the last dose. Talk to your care team about effective forms of contraception. Do not father a child while taking this medication and for 3 months after the last dose. Use a condom while having sex during this time period. Do not breastfeed while taking this medication. This medication may cause infertility. Talk to your care team if you are concerned about your fertility. What side effects may I notice from receiving this medication? Side effects that you should report to your care team as soon as possible: Allergic reactions--skin rash, itching, hives, swelling of the face, lips, tongue, or throat Heart attack--pain or tightness in the chest, shoulders, arms, or jaw, nausea, shortness of breath, cold or clammy skin, feeling faint or lightheaded Heart failure--shortness of breath, swelling of the ankles, feet, or hands, sudden weight gain, unusual weakness or fatigue Heart rhythm changes--fast or irregular heartbeat, dizziness, feeling faint or lightheaded, chest pain, trouble breathing High ammonia level--unusual weakness or fatigue, confusion, loss of appetite, nausea, vomiting, seizures Infection--fever, chills, cough, sore throat, wounds that don't heal, pain or trouble when passing urine, general feeling of discomfort or being unwell Low red blood cell level--unusual weakness or fatigue, dizziness, headache, trouble breathing Pain, tingling, or numbness in the hands or feet, muscle weakness, change in vision, confusion or trouble speaking, loss of balance or coordination, trouble walking, seizures Redness, swelling, and blistering of the skin over hands and feet Severe or prolonged diarrhea Unusual bruising or bleeding Side effects that usually do not require medical attention (report to your care team if they continue or are bothersome): Dry skin Headache Increased tears Nausea Pain, redness, or swelling with sores inside the mouth or throat Sensitivity  to light Vomiting This list may not describe all possible side effects. Call your doctor for medical advice about side effects. You may report side effects to FDA at 1-800-FDA-1088. Where should I keep my medication? This medication is given in a hospital or clinic. It will not be stored at home. NOTE: This sheet is a summary. It may not cover all possible information. If you have questions about this medicine, talk to your doctor, pharmacist, or health care provider.  2024 Elsevier/Gold Standard (2021-06-09 00:00:00) Leucovorin Injection What is this medication? LEUCOVORIN (loo koe VOR in) prevents side effects from certain medications, such as methotrexate. It works by increasing folate levels. This helps protect healthy cells in your body. It may also be used to treat anemia caused by low levels of folate. It can also be used with fluorouracil, a type of chemotherapy, to treat colorectal cancer. It works by increasing the effects of fluorouracil in the body. This medicine may be used for other purposes; ask your health care provider or pharmacist if you have questions. What should I tell my care team before I take this medication? They need to know if you have any of these conditions: Anemia from low levels of vitamin B12 in the blood An unusual or allergic reaction to leucovorin, folic acid, other medications, foods, dyes, or preservatives Pregnant or trying to get pregnant Breastfeeding How should I use this medication? This medication is injected into a vein or a muscle. It is given by your care team   in a hospital or clinic setting. Talk to your care team about the use of this medication in children. Special care may be needed. Overdosage: If you think you have taken too much of this medicine contact a poison control center or emergency room at once. NOTE: This medicine is only for you. Do not share this medicine with others. What if I miss a dose? Keep appointments for follow-up doses.  It is important not to miss your dose. Call your care team if you are unable to keep an appointment. What may interact with this medication? Capecitabine Fluorouracil Phenobarbital Phenytoin Primidone Trimethoprim;sulfamethoxazole This list may not describe all possible interactions. Give your health care provider a list of all the medicines, herbs, non-prescription drugs, or dietary supplements you use. Also tell them if you smoke, drink alcohol, or use illegal drugs. Some items may interact with your medicine. What should I watch for while using this medication? Your condition will be monitored carefully while you are receiving this medication. This medication may increase the side effects of 5-fluorouracil. Tell your care team if you have diarrhea or mouth sores that do not get better or that get worse. What side effects may I notice from receiving this medication? Side effects that you should report to your care team as soon as possible: Allergic reactions--skin rash, itching, hives, swelling of the face, lips, tongue, or throat This list may not describe all possible side effects. Call your doctor for medical advice about side effects. You may report side effects to FDA at 1-800-FDA-1088. Where should I keep my medication? This medication is given in a hospital or clinic. It will not be stored at home. NOTE: This sheet is a summary. It may not cover all possible information. If you have questions about this medicine, talk to your doctor, pharmacist, or health care provider.  2024 Elsevier/Gold Standard (2021-07-07 00:00:00) Nivolumab Injection What is this medication? NIVOLUMAB (nye VOL ue mab) treats some types of cancer. It works by helping your immune system slow or stop the spread of cancer cells. It is a monoclonal antibody. This medicine may be used for other purposes; ask your health care provider or pharmacist if you have questions. COMMON BRAND NAME(S): Opdivo What should I  tell my care team before I take this medication? They need to know if you have any of these conditions: Allogeneic stem cell transplant (uses someone else's stem cells) Autoimmune diseases, such as Crohn disease, ulcerative colitis, lupus History of chest radiation Nervous system problems, such as Guillain-Barre syndrome or myasthenia gravis Organ transplant An unusual or allergic reaction to nivolumab, other medications, foods, dyes, or preservatives Pregnant or trying to get pregnant Breast-feeding How should I use this medication? This medication is infused into a vein. It is given in a hospital or clinic setting. A special MedGuide will be given to you before each treatment. Be sure to read this information carefully each time. Talk to your care team about the use of this medication in children. While it may be prescribed for children as young as 12 years for selected conditions, precautions do apply. Overdosage: If you think you have taken too much of this medicine contact a poison control center or emergency room at once. NOTE: This medicine is only for you. Do not share this medicine with others. What if I miss a dose? Keep appointments for follow-up doses. It is important not to miss your dose. Call your care team if you are unable to keep an appointment. What may  interact with this medication? Interactions have not been studied. This list may not describe all possible interactions. Give your health care provider a list of all the medicines, herbs, non-prescription drugs, or dietary supplements you use. Also tell them if you smoke, drink alcohol, or use illegal drugs. Some items may interact with your medicine. What should I watch for while using this medication? Your condition will be monitored carefully while you are receiving this medication. You may need blood work while taking this medication. This medication may cause serious skin reactions. They can happen weeks to months after  starting the medication. Contact your care team right away if you notice fevers or flu-like symptoms with a rash. The rash may be red or purple and then turn into blisters or peeling of the skin. You may also notice a red rash with swelling of the face, lips, or lymph nodes in your neck or under your arms. Tell your care team right away if you have any change in your eyesight. Talk to your care team if you are pregnant or think you might be pregnant. A negative pregnancy test is required before starting this medication. A reliable form of contraception is recommended while taking this medication and for 5 months after the last dose. Talk to your care team about effective forms of contraception. Do not breast-feed while taking this medication and for 5 months after the last dose. What side effects may I notice from receiving this medication? Side effects that you should report to your care team as soon as possible: Allergic reactions--skin rash, itching, hives, swelling of the face, lips, tongue, or throat Dry cough, shortness of breath or trouble breathing Eye pain, redness, irritation, or discharge with blurry or decreased vision Heart muscle inflammation--unusual weakness or fatigue, shortness of breath, chest pain, fast or irregular heartbeat, dizziness, swelling of the ankles, feet, or hands Hormone gland problems--headache, sensitivity to light, unusual weakness or fatigue, dizziness, fast or irregular heartbeat, increased sensitivity to cold or heat, excessive sweating, constipation, hair loss, increased thirst or amount of urine, tremors or shaking, irritability Infusion reactions--chest pain, shortness of breath or trouble breathing, feeling faint or lightheaded Kidney injury (glomerulonephritis)--decrease in the amount of urine, red or dark brown urine, foamy or bubbly urine, swelling of the ankles, hands, or feet Liver injury--right upper belly pain, loss of appetite, nausea, light-colored  stool, dark yellow or brown urine, yellowing skin or eyes, unusual weakness or fatigue Pain, tingling, or numbness in the hands or feet, muscle weakness, change in vision, confusion or trouble speaking, loss of balance or coordination, trouble walking, seizures Rash, fever, and swollen lymph nodes Redness, blistering, peeling, or loosening of the skin, including inside the mouth Sudden or severe stomach pain, bloody diarrhea, fever, nausea, vomiting Side effects that usually do not require medical attention (report these to your care team if they continue or are bothersome): Bone, joint, or muscle pain Diarrhea Fatigue Loss of appetite Nausea Skin rash This list may not describe all possible side effects. Call your doctor for medical advice about side effects. You may report side effects to FDA at 1-800-FDA-1088. Where should I keep my medication? This medication is given in a hospital or clinic. It will not be stored at home. NOTE: This sheet is a summary. It may not cover all possible information. If you have questions about this medicine, talk to your doctor, pharmacist, or health care provider.  2024 Elsevier/Gold Standard (2021-06-01 00:00:00)

## 2022-08-10 ENCOUNTER — Encounter: Payer: Self-pay | Admitting: Oncology

## 2022-08-11 ENCOUNTER — Inpatient Hospital Stay: Payer: Medicare Other

## 2022-08-11 VITALS — BP 120/60 | HR 80 | Temp 99.5°F | Resp 18 | Ht 60.0 in | Wt 203.0 lb

## 2022-08-11 DIAGNOSIS — Z79899 Other long term (current) drug therapy: Secondary | ICD-10-CM | POA: Diagnosis not present

## 2022-08-11 DIAGNOSIS — Z5111 Encounter for antineoplastic chemotherapy: Secondary | ICD-10-CM | POA: Diagnosis not present

## 2022-08-11 DIAGNOSIS — C169 Malignant neoplasm of stomach, unspecified: Secondary | ICD-10-CM | POA: Diagnosis not present

## 2022-08-11 DIAGNOSIS — C168 Malignant neoplasm of overlapping sites of stomach: Secondary | ICD-10-CM

## 2022-08-11 MED ORDER — SODIUM CHLORIDE 0.9 % IV SOLN: Freq: Once | INTRAVENOUS | Status: AC

## 2022-08-11 MED ORDER — HEPARIN SOD (PORK) LOCK FLUSH 100 UNIT/ML IV SOLN
500.0000 [IU] | Freq: Once | INTRAVENOUS | Status: AC | PRN
  Administered 2022-08-11: 500 [IU]

## 2022-08-11 MED ORDER — SODIUM CHLORIDE 0.9% FLUSH
10.0000 mL | INTRAVENOUS | Status: DC | PRN
  Administered 2022-08-11: 10 mL

## 2022-08-11 NOTE — Patient Instructions (Signed)

## 2022-08-13 ENCOUNTER — Encounter: Payer: Self-pay | Admitting: Oncology

## 2022-08-18 ENCOUNTER — Encounter: Payer: Self-pay | Admitting: Oncology

## 2022-08-23 ENCOUNTER — Inpatient Hospital Stay: Payer: Medicare Other | Attending: Hematology and Oncology | Admitting: Hematology and Oncology

## 2022-08-23 ENCOUNTER — Encounter: Payer: Self-pay | Admitting: Hematology and Oncology

## 2022-08-23 ENCOUNTER — Inpatient Hospital Stay: Payer: Medicare Other

## 2022-08-23 VITALS — BP 147/68 | HR 86 | Temp 98.6°F | Resp 16 | Ht 60.0 in | Wt 203.7 lb

## 2022-08-23 DIAGNOSIS — G8929 Other chronic pain: Secondary | ICD-10-CM

## 2022-08-23 DIAGNOSIS — C786 Secondary malignant neoplasm of retroperitoneum and peritoneum: Secondary | ICD-10-CM | POA: Diagnosis not present

## 2022-08-23 DIAGNOSIS — N142 Nephropathy induced by unspecified drug, medicament or biological substance: Secondary | ICD-10-CM | POA: Diagnosis not present

## 2022-08-23 DIAGNOSIS — M25512 Pain in left shoulder: Secondary | ICD-10-CM | POA: Diagnosis not present

## 2022-08-23 DIAGNOSIS — C168 Malignant neoplasm of overlapping sites of stomach: Secondary | ICD-10-CM

## 2022-08-23 DIAGNOSIS — N12 Tubulo-interstitial nephritis, not specified as acute or chronic: Secondary | ICD-10-CM | POA: Diagnosis not present

## 2022-08-23 DIAGNOSIS — Z79899 Other long term (current) drug therapy: Secondary | ICD-10-CM | POA: Insufficient documentation

## 2022-08-23 DIAGNOSIS — Z5111 Encounter for antineoplastic chemotherapy: Secondary | ICD-10-CM | POA: Diagnosis not present

## 2022-08-23 DIAGNOSIS — C169 Malignant neoplasm of stomach, unspecified: Secondary | ICD-10-CM | POA: Diagnosis not present

## 2022-08-23 DIAGNOSIS — M19012 Primary osteoarthritis, left shoulder: Secondary | ICD-10-CM | POA: Diagnosis not present

## 2022-08-23 DIAGNOSIS — M25519 Pain in unspecified shoulder: Secondary | ICD-10-CM | POA: Insufficient documentation

## 2022-08-23 LAB — CBC AND DIFFERENTIAL
HCT: 35 — AB (ref 36–46)
Hemoglobin: 12.3 (ref 12.0–16.0)
Neutrophils Absolute: 5.42
Platelets: 162 10*3/uL (ref 150–400)
WBC: 9.5

## 2022-08-23 LAB — HEPATIC FUNCTION PANEL
ALT: 18 U/L (ref 7–35)
AST: 29 (ref 13–35)
Alkaline Phosphatase: 72 (ref 25–125)
Bilirubin, Total: 0.7

## 2022-08-23 LAB — COMPREHENSIVE METABOLIC PANEL (ASHBORO CC SCANNED REPORT)

## 2022-08-23 LAB — BASIC METABOLIC PANEL
BUN: 25 — AB (ref 4–21)
CO2: 22 (ref 13–22)
Chloride: 105 (ref 99–108)
Creatinine: 1.3 — AB (ref 0.5–1.1)
EGFR: 39
Glucose: 111
Potassium: 4.1 mEq/L (ref 3.5–5.1)
Sodium: 138 (ref 137–147)

## 2022-08-23 LAB — CBC: RBC: 3.86 — AB (ref 3.87–5.11)

## 2022-08-23 LAB — TSH: TSH: 1.953 u[IU]/mL (ref 0.350–4.500)

## 2022-08-23 LAB — COMPREHENSIVE METABOLIC PANEL
Albumin: 4.4 (ref 3.5–5.0)
Calcium: 9.4 (ref 8.7–10.7)

## 2022-08-23 LAB — CBC W DIFFERENTIAL (~~LOC~~ CC SCANNED REPORT)

## 2022-08-23 NOTE — Assessment & Plan Note (Signed)
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  This resolved with prednisone.  Her creatinine remains at baseline.  We continue to give her IV fluids the week of her chemotherapy.

## 2022-08-23 NOTE — Progress Notes (Signed)
[facility]  [address] [place],  [place]  [address] [phone]  Clinic Day:  08/23/2022  Referring physician: Philemon Kingdom, MD  ASSESSMENT & PLAN:   Assessment & Plan: Gastric cancer (HCC) Stage IVB (T4 N0 M1) poorly differentiated adenocarcinoma of the stomach with signet ring features and ulceration, metastatic to the omentum.  Stain for HER2 was negative at 0.  PET scan confirmed omental involvement, but no distant metastasis was seen. She is receiving palliative chemotherapy with FOLFOX/nivolumab (5-fluorouracil/oxaliplatin/nivolumab), which now consists of 5-fluorouracil/nivolumab.  Oxaliplatin was discontinued after 11 cycles. She continues to tolerate this fairly well.  She will proceed with 19th cycle this week.  We will plan to see her back in 10 days with a CBC, comprehensive metabolic panel, TSH and CT chest, abdomen and pelvis to reassess her disease baseline prior to a 20th cycle.  Drug-induced interstitial nephritis The of immune mediated nephritis from immunotherapy.  This resolved with prednisone.  Her creatinine remains at baseline.  We continue to give her IV fluids the week of her chemotherapy.  Shoulder pain Worsening left shoulder pain with decreased range of motion.  I will obtain x-ray today for initial evaluation.  We may wish to have her see orthopedics.    The patient understands the plans discussed today and is in agreement with them.  She knows to contact our office if she develops concerns prior to her next appointment.   I provided 20 minutes of face-to-face time during this encounter and > 50% was spent counseling as documented under my assessment and plan.    Mackenzie Perl, PA-C  [facility] AT [facility] [address] [place] [address] Dept: [phone] Dept Fax: [phone]   Orders Placed This Encounter  Procedures   DG Shoulder Left     Standing Status:   Future    Standing Expiration Date:   08/23/2023    Scheduling Instructions:     RH    Order Specific Question:   Reason for Exam (SYMPTOM  OR DIAGNOSIS REQUIRED)    Answer:   left shoulder xray-pain    Order Specific Question:   Preferred imaging location?    Answer:   External   CT CHEST ABDOMEN PELVIS W CONTRAST    Standing Status:   Future    Standing Expiration Date:   08/23/2023    Scheduling Instructions:     RH    Order Specific Question:   If indicated for the ordered procedure, I authorize the administration of contrast media per Radiology protocol    Answer:   Yes    Order Specific Question:   Does the patient have a contrast media/X-ray dye allergy?    Answer:   No    Order Specific Question:   Preferred imaging location?    Answer:   External    Order Specific Question:   If indicated for the ordered procedure, I authorize the administration of oral contrast media per Radiology protocol    Answer:   Yes   CBC and differential    This external order was created through the Results Console.   CBC    This external order was created through the Results Console.   Basic metabolic panel    This external order was created through the Results Console.   Comprehensive metabolic panel    This external order was created through the Results Console.   Hepatic function panel  This external order was created through the Results Console.      CHIEF COMPLAINT:  CC: Stage IV gastric cancer  Current Treatment: 5-FU/leucovorin/nivolumab  HISTORY OF PRESENT ILLNESS:  Mackenzie Lewis is a 87 y.o. female with stage IVB (T4b, N0, M1) gastric cancer diagnosed in July 2023.  She was referred by Dr. Shara Blazing for assessment and management.  She had noticed that she was having regurgitation when eating and had lost over 30 pounds.  An ultrasound was done, revealing hepatic steatosis, and led to an MRI scan in June, which revealed gastric wall thickening with confluent  nodularity of the omentum anterior to the stomach measuring 3.5 cm consistent with metastatic tumor.  She also had low-grade edema and wall thickening extending into the duodenum from the stomach.  She was referred to Dr. Shara Blazing and he did an EGD in July.  This revealed a large ulceration measuring 1.2 cm along the greater curvature.  She also had diffusely edematous and erythematous wall with erosions of the antrum and stiff and friable mucosa with oozing of blood.  These findings extended to the gastric fundus as well.  Pathology revealed a poorly differentiated adenocarcinoma with signet ring features from the biopsies of the ulcer as well as the antrum and the fundus of the stomach.  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  She was placed on omeprazole.  Her test for H. pylori was negative.  She was referred to Dr. Hardin Negus for consideration of surgery, but he felt this was not resectable because of the extensive involvement.  We consider this extending to the duodenum and metastatic to the omentum.  PET scan confirmed these findings.  She wished to pursue systemic intravenous therapy.  CEA and CA 19-9 were normal. She is receiving palliative FOLFOX/nivolumab, which now consists of 5-fluorouracil/leucovorin/nivolumab.  Oxaliplatin was discontinued after 11 cycles.  She has tolerated this fairly well.  Recent imaging with PET to be wary revealed resolution of metabolic activity associated with the stomach.  Decrease in size of omental nodularity. No associated metabolic activity.  EGD in March revealed persistent disease.  Oncology History  Gastric cancer (HCC)  09/10/2021 Initial Diagnosis   Gastric cancer (HCC)   09/10/2021 Cancer Staging   Staging form: Stomach, AJCC 8th Edition - Clinical stage from 09/10/2021: Stage IVB (cT4b, cN0, cM1) - Signed by Dellia Beckwith, MD on 09/10/2021 Histopathologic type: Adenocarcinoma, NOS Stage  prefix: Initial diagnosis Total positive nodes: 0 Histologic grade (G): G3 Histologic grading system: 3 grade system Sites of metastasis: Peritoneal surface Diagnostic confirmation: Positive histology PLUS positive immunophenotyping and/or positive genetic studies Specimen type: Endoscopy with Biopsy Staged by: Managing physician Carcinoembryonic antigen (CEA) (ng/mL): 2.8 Carbohydrate antigen 19-9 (CA 19-9) (U/mL): 4.9 HER2 status: Unknown Microsatellite instability (MSI): Unknown Tumor location in stomach: Other Clinical staging modalities: Biopsy, Endoscopy Stage used in treatment planning: Yes National guidelines used in treatment planning: Yes Type of national guideline used in treatment planning: NCCN   09/28/2021 - 10/14/2021 Chemotherapy   Patient is on Treatment Plan : GASTROESOPHAGEAL FOLFOX + Nivolumab q14d     09/28/2021 -  Chemotherapy   Patient is on Treatment Plan : GASTROESOPHAGEAL FOLFOX + Nivolumab q14d     12/09/2021 Genetic Testing   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  Report date is 12/09/2021.   The Multi-Cancer + RNA Panel offered by [facility] includes sequencing and/or deletion/duplication analysis of the following  84 genes:  AIP*, ALK, APC*, ATM*, AXIN2*, BAP1*, BARD1*, BLM*, BMPR1A*, BRCA1*, BRCA2*, BRIP1*, CASR, CDC73*, CDH1*, CDK4, CDKN1B*, CDKN1C*, CDKN2A, CEBPA, CHEK2*, CTNNA1*, DICER1*, DIS3L2*, EGFR, EPCAM, FH*, FLCN*, GATA2*, GPC3, GREM1, HOXB13, HRAS, KIT, MAX*, MEN1*, MET, MITF, MLH1*, MSH2*, MSH3*, MSH6*, MUTYH*, NBN*, NF1*, NF2*, NTHL1*, PALB2*, PDGFRA, PHOX2B, PMS2*, POLD1*, POLE*, POT1*, PRKAR1A*, PTCH1*, PTEN*, RAD50*, RAD51C*, RAD51D*, RB1*, RECQL4, RET, RUNX1*, SDHA*, SDHAF2*, SDHB*, SDHC*, SDHD*, SMAD4*, SMARCA4*, SMARCB1*, SMARCE1*, STK11*, SUFU*, TERC, TERT, TMEM127*, Tp53*, TSC1*, TSC2*, VHL*, WRN*, and WT1.  RNA analysis is performed for * genes.       INTERVAL HISTORY:  Mackenzie is here today  for repeat clinical assessment prior to a 19th cycle of 5-FU/leucovorin/nivolumab.  She reports worsening left shoulder pain occasionally radiating down her left arm.  She is using Bengay.  We recommended Voltaren gel and lidocaine patches, but she has not tried those yet. She denies fevers or chills. She denies pain. Her appetite is good. Her weight has been stable.  REVIEW OF SYSTEMS:  Review of Systems  Constitutional:  Positive for fatigue. Negative for appetite change, chills, fever and unexpected weight change.  HENT:   Negative for lump/mass, mouth sores and sore throat.   Respiratory:  Negative for cough and shortness of breath.   Cardiovascular:  Negative for chest pain and leg swelling.  Gastrointestinal:  Negative for abdominal pain, constipation, diarrhea, nausea and vomiting.  Endocrine: Negative for hot flashes.  Genitourinary:  Negative for difficulty urinating, dysuria, frequency and hematuria.   Musculoskeletal:  Positive for arthralgias (left shoulder). Negative for back pain and myalgias.  Skin:  Negative for rash.  Neurological:  Negative for dizziness and headaches.  Hematological:  Negative for adenopathy. Bruises/bleeds easily.  Psychiatric/Behavioral:  Negative for depression and sleep disturbance. The patient is not nervous/anxious.      VITALS:  Blood pressure (!) 147/68, pulse 86, temperature 98.6 F (37 C), temperature source Oral, resp. rate 16, height 5' (1.524 m), weight 203 lb 11.2 oz (92.4 kg), SpO2 95 %.  Wt Readings from Last 3 Encounters:  08/23/22 203 lb 11.2 oz (92.4 kg)  08/11/22 203 lb 0.6 oz (92.1 kg)  08/09/22 203 lb 1.3 oz (92.1 kg)    Body mass index is 39.78 kg/m.  Performance status (ECOG): 1 - Symptomatic but completely ambulatory  PHYSICAL EXAM:  Physical Exam Vitals and nursing note reviewed.  Constitutional:      General: She is not in acute distress.    Appearance: Normal appearance.  HENT:     Head: Normocephalic and  atraumatic.     Mouth/Throat:     Mouth: Mucous membranes are moist.     Pharynx: Oropharynx is clear. No oropharyngeal exudate or posterior oropharyngeal erythema.  Eyes:     General: No scleral icterus.    Extraocular Movements: Extraocular movements intact.     Conjunctiva/sclera: Conjunctivae normal.     Pupils: Pupils are equal, round, and reactive to light.  Cardiovascular:     Rate and Rhythm: Normal rate and regular rhythm.     Heart sounds: Normal heart sounds. No murmur heard.    No friction rub. No gallop.  Pulmonary:     Effort: Pulmonary effort is normal.     Breath sounds: Normal breath sounds. No wheezing, rhonchi or rales.  Abdominal:     General: There is no distension.     Palpations: Abdomen is soft. There is no hepatomegaly, splenomegaly or mass.     Tenderness: There is no abdominal  tenderness.  Musculoskeletal:     Left shoulder: Tenderness (Left scapula and trapezius) present. No bony tenderness. Decreased range of motion (Difficulty raising her left arm above her head and behind her back).     Cervical back: Normal range of motion and neck supple. No tenderness.     Right lower leg: No edema.     Left lower leg: No edema.  Lymphadenopathy:     Cervical: No cervical adenopathy.     Upper Body:     Right upper body: No supraclavicular or axillary adenopathy.     Left upper body: No supraclavicular or axillary adenopathy.     Lower Body: No right inguinal adenopathy. No left inguinal adenopathy.  Skin:    General: Skin is warm and dry.     Coloration: Skin is not jaundiced.     Findings: No rash.  Neurological:     Mental Status: She is alert and oriented to person, place, and time.     Cranial Nerves: No cranial nerve deficit.  Psychiatric:        Mood and Affect: Mood normal.        Behavior: Behavior normal.        Thought Content: Thought content normal.     LABS:      Latest Ref Rng & Units 08/23/2022   12:00 AM 08/05/2022   12:00 AM 07/22/2022    12:00 AM  CBC  WBC  9.5     6.7     9.4      Hemoglobin 12.0 - 16.0 12.3     12.5     12.1      Hematocrit 36 - 46 35     36     34      Platelets 150 - 400 K/uL 162     199     219         This result is from an external source.      Latest Ref Rng & Units 08/23/2022   12:00 AM 08/05/2022   12:00 AM 07/22/2022   12:00 AM  CMP  BUN 4 - 21 25     21     25       Creatinine 0.5 - 1.1 1.3     1.2     1.3      Sodium 137 - 147 138     138     139      Potassium 3.5 - 5.1 mEq/L 4.1     3.9     3.8      Chloride 99 - 108 105     105     106      CO2 13 - 22 22     23     19       Calcium 8.7 - 10.7 9.4     9.8     9.4      Alkaline Phos 25 - 125 72     66     63      AST 13 - 35 29     31     28       ALT 7 - 35 U/L 18     19     17          This result is from an external source.     Lab Results  Component Value Date   CEA1 2.9 09/10/2021   /  CEA  Date Value Ref  Range Status  09/10/2021 2.9 0.0 - 4.7 ng/mL Final    Comment:    (NOTE)                             Nonsmokers          <3.9                             Smokers             <5.6 [employer] Electrochemiluminescence Immunoassay (ECLIA) Values obtained with different assay methods or kits cannot be used interchangeably.  Results cannot be interpreted as absolute evidence of the presence or absence of malignant disease. Performed At: [facility] [address] [place], [place] [id number] Jolene Schimke MD WU:[id number]    No results found for: "PSA1" No results found for: "[id number]" No results found for: "CAN125"  No results found for: "TOTALPROTELP", "ALBUMINELP", "A1GS", "A2GS", "BETS", "BETA2SER", "GAMS", "MSPIKE", "SPEI" No results found for: "TIBC", "FERRITIN", "IRONPCTSAT" No results found for: "LDH"  STUDIES:  No results found.    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  If that happens, she is to call us so that we can schedule her for an ultrasound to look for synovitis.     Essential hypertension 09/09/2016   Gastric cancer (HCC) 09/10/2021   Hyperlipidemia 09/01/2016   Hypertension    Hypothyroidism 09/01/2016   Osteoarthritis of both feet 01/20/2016   Osteoarthritis, hand 01/20/2016   Thyroid disease     Past Surgical History:  Procedure Laterality Date   APPENDECTOMY     LAPAROSCOPIC APPENDECTOMY N/A 04/10/2016   Procedure: APPENDECTOMY LAPAROSCOPIC;  Surgeon: Abigail Miyamoto, MD;  Location: [facility] OR;  Service: General;  Laterality: N/A;    Family History  Problem Relation Age of Onset   Hypertension Mother    Prostate cancer Father        metastatic; d. 31   Brain cancer Sister 14   Breast cancer Sister 36   AAA (abdominal aortic aneurysm) Brother    Leukemia Cousin        x2 maternal female cousins; d. before 25   Breast cancer Daughter 27       DCIS    Social History:  reports that she has never smoked. She has never used smokeless tobacco. She reports that she does not currently use alcohol. She reports that she does not use drugs.The patient is accompanied by her daughter today.  Allergies:  Allergies  Allergen Reactions   Doxycycline Rash   Sulfa Antibiotics Rash and Other (See Comments)    Other reaction(s): Other (See Comments)  "Made me feel weird"    Current Medications: Current Outpatient Medications  Medication Sig Dispense Refill   aspirin 81 MG EC tablet Take 81 mg by mouth daily. Swallow whole.      b complex vitamins capsule Take 1 capsule  by mouth daily.     Calcium Carbonate (CALCIUM 600 PO) Take 1 tablet by mouth daily.     Cholecalciferol (VITAMIN D3) 5000 units CAPS Take 1 capsule by mouth daily.     EXFORGE HCT 5-160-12.5 MG TABS Take 1 tablet by mouth daily.     famotidine (PEPCID) 40 MG tablet Take 40 mg by mouth at bedtime.     KRILL OIL PO Take 1 capsule by mouth daily. Unknown strenght     Lactobacillus TABS Take 1 tablet by mouth 2 (two) times daily.     levothyroxine (SYNTHROID, LEVOTHROID) 75 MCG tablet Take 75 mcg by mouth daily before breakfast.      magic mouthwash SOLN SWISH AND SWALLOW BY MOUTH EVERY THREE TO FOUR HOURS     NON FORMULARY MMW: 3 parts Maalox 2 parts Benadryl 1 part viscious lidicaine  Disp. 6oz  Instructions: 5ml swish and swallow every 3-4 hours     omeprazole (PRILOSEC) 40 MG capsule Take 1 capsule (40 mg total) by mouth 2 (two) times daily. 60 capsule 5   ondansetron (ZOFRAN-ODT) 4 MG disintegrating tablet Take 1 tablet (4 mg total) by mouth every 8 (eight) hours as needed for nausea or vomiting. 90 tablet 0   polyethylene glycol powder (GLYCOLAX/MIRALAX) 17 GM/SCOOP powder SMARTSIG:1 scoopful By Mouth Daily     potassium chloride SA (KLOR-CON M) 20 MEQ tablet TAKE ONE TABLET BY MOUTH THREE TIMES DAILY 90 tablet 5   pravastatin (PRAVACHOL) 20 MG tablet Take 1 tablet (20 mg total) by mouth every evening. 90 tablet 3   Probiotic Product (PROBIOTIC DAILY PO) Take 1 tablet by mouth daily.     prochlorperazine (COMPAZINE) 10 MG tablet Take 1 tablet (10 mg total) by mouth every 6 (six) hours as needed for nausea or vomiting. 90 tablet 3   triamcinolone 0.1% oint-Eucerin equivalent cream 1:1 mixture Apply topically 2 (two) times daily. 160 g 0   triamcinolone cream (KENALOG) 0.1 % Apply to affected skin twice daily (Patient taking differently: daily as needed. Apply to affected skin twice daily) 160 g 0   No current facility-administered  medications for this visit.

## 2022-08-23 NOTE — Assessment & Plan Note (Addendum)
Worsening left shoulder pain with decreased range of motion.  I will obtain x-ray today for initial evaluation.  We may wish to have her see orthopedics.

## 2022-08-23 NOTE — Assessment & Plan Note (Signed)
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  Stain for HER2 was negative at 0.  PET scan confirmed omental involvement, but no distant metastasis was seen. She is receiving palliative chemotherapy with FOLFOX/nivolumab (5-fluorouracil/oxaliplatin/nivolumab), which now consists of 5-fluorouracil/nivolumab.  Oxaliplatin was discontinued after 11 cycles. She continues to tolerate this fairly well.  She will proceed with 19th cycle this week.  We will plan to see her back in 10 days with a CBC, comprehensive metabolic panel, TSH and CT chest, abdomen and pelvis to reassess her disease baseline prior to a 20th cycle.

## 2022-08-24 LAB — T4: T4, Total: 10.7 ug/dL (ref 4.5–12.0)

## 2022-08-24 MED FILL — Leucovorin Calcium For Inj 350 MG: INTRAMUSCULAR | Qty: 31.5 | Status: AC

## 2022-08-24 MED FILL — Nivolumab IV Soln 240 MG/24ML: INTRAVENOUS | Qty: 24 | Status: AC

## 2022-08-24 MED FILL — Fluorouracil IV Soln 2.5 GM/50ML (50 MG/ML): INTRAVENOUS | Qty: 13 | Status: AC

## 2022-08-24 MED FILL — Dexamethasone Sodium Phosphate Inj 100 MG/10ML: INTRAMUSCULAR | Qty: 1 | Status: AC

## 2022-08-24 MED FILL — Fluorouracil IV Soln 5 GM/100ML (50 MG/ML): INTRAVENOUS | Qty: 77 | Status: AC

## 2022-08-25 ENCOUNTER — Telehealth: Payer: Self-pay | Admitting: Hematology and Oncology

## 2022-08-25 ENCOUNTER — Inpatient Hospital Stay: Payer: Medicare Other

## 2022-08-25 VITALS — BP 155/70 | HR 87 | Temp 98.7°F | Resp 18 | Ht 60.0 in | Wt 204.0 lb

## 2022-08-25 DIAGNOSIS — M25512 Pain in left shoulder: Secondary | ICD-10-CM | POA: Diagnosis not present

## 2022-08-25 DIAGNOSIS — C169 Malignant neoplasm of stomach, unspecified: Secondary | ICD-10-CM | POA: Diagnosis not present

## 2022-08-25 DIAGNOSIS — C786 Secondary malignant neoplasm of retroperitoneum and peritoneum: Secondary | ICD-10-CM | POA: Diagnosis not present

## 2022-08-25 DIAGNOSIS — Z79899 Other long term (current) drug therapy: Secondary | ICD-10-CM | POA: Diagnosis not present

## 2022-08-25 DIAGNOSIS — C168 Malignant neoplasm of overlapping sites of stomach: Secondary | ICD-10-CM

## 2022-08-25 DIAGNOSIS — Z5111 Encounter for antineoplastic chemotherapy: Secondary | ICD-10-CM | POA: Diagnosis not present

## 2022-08-25 DIAGNOSIS — N12 Tubulo-interstitial nephritis, not specified as acute or chronic: Secondary | ICD-10-CM | POA: Diagnosis not present

## 2022-08-25 MED ORDER — DEXTROSE 5 % IV SOLN: Freq: Once | INTRAVENOUS | Status: AC

## 2022-08-25 MED ORDER — FLUOROURACIL CHEMO INJECTION 2.5 GM/50ML
320.0000 mg/m2 | Freq: Once | INTRAVENOUS | Status: AC
  Administered 2022-08-25: 650 mg via INTRAVENOUS
  Filled 2022-08-25: qty 13

## 2022-08-25 MED ORDER — SODIUM CHLORIDE 0.9 % IV SOLN
10.0000 mg | Freq: Once | INTRAVENOUS | Status: AC
  Administered 2022-08-25: 10 mg via INTRAVENOUS
  Filled 2022-08-25: qty 10

## 2022-08-25 MED ORDER — PALONOSETRON HCL INJECTION 0.25 MG/5ML
0.2500 mg | Freq: Once | INTRAVENOUS | Status: AC
  Administered 2022-08-25: 0.25 mg via INTRAVENOUS
  Filled 2022-08-25: qty 5

## 2022-08-25 MED ORDER — SODIUM CHLORIDE 0.9 % IV SOLN: Freq: Once | INTRAVENOUS | Status: AC

## 2022-08-25 MED ORDER — HEPARIN SOD (PORK) LOCK FLUSH 100 UNIT/ML IV SOLN: 500.0000 [IU] | Freq: Once | INTRAVENOUS | Status: DC | PRN

## 2022-08-25 MED ORDER — SODIUM CHLORIDE 0.9% FLUSH: 10.0000 mL | INTRAVENOUS | Status: DC | PRN

## 2022-08-25 MED ORDER — LEUCOVORIN CALCIUM INJECTION 350 MG
320.0000 mg/m2 | Freq: Once | INTRAVENOUS | Status: AC
  Administered 2022-08-25: 630 mg via INTRAVENOUS
  Filled 2022-08-25: qty 31.5

## 2022-08-25 MED ORDER — SODIUM CHLORIDE 0.9 % IV SOLN
240.0000 mg | Freq: Once | INTRAVENOUS | Status: AC
  Administered 2022-08-25: 240 mg via INTRAVENOUS
  Filled 2022-08-25: qty 24

## 2022-08-25 MED ORDER — SODIUM CHLORIDE 0.9 % IV SOLN
1965.0000 mg/m2 | INTRAVENOUS | Status: DC
  Administered 2022-08-25: 3850 mg via INTRAVENOUS
  Filled 2022-08-25: qty 77

## 2022-08-25 NOTE — Telephone Encounter (Signed)
08/25/22 Spoke with patient and scheduled ct scan on 08/30/22

## 2022-08-25 NOTE — Patient Instructions (Signed)
Fluorouracil Injection What is this medication? FLUOROURACIL (flure oh YOOR a sil) treats some types of cancer. It works by slowing down the growth of cancer cells. This medicine may be used for other purposes; ask your health care provider or pharmacist if you have questions. COMMON BRAND NAME(S): Adrucil What should I tell my care team before I take this medication? They need to know if you have any of these conditions: Blood disorders Dihydropyrimidine dehydrogenase (DPD) deficiency Infection, such as chickenpox, cold sores, herpes Kidney disease Liver disease Poor nutrition Recent or ongoing radiation therapy An unusual or allergic reaction to fluorouracil, other medications, foods, dyes, or preservatives If you or your partner are pregnant or trying to get pregnant Breast-feeding How should I use this medication? This medication is injected into a vein. It is administered by your care team in a hospital or clinic setting. Talk to your care team about the use of this medication in children. Special care may be needed. Overdosage: If you think you have taken too much of this medicine contact a poison control center or emergency room at once. NOTE: This medicine is only for you. Do not share this medicine with others. What if I miss a dose? Keep appointments for follow-up doses. It is important not to miss your dose. Call your care team if you are unable to keep an appointment. What may interact with this medication? Do not take this medication with any of the following: Live virus vaccines This medication may also interact with the following: Medications that treat or prevent blood clots, such as warfarin, enoxaparin, dalteparin This list may not describe all possible interactions. Give your health care provider a list of all the medicines, herbs, non-prescription drugs, or dietary supplements you use. Also tell them if you smoke, drink alcohol, or use illegal drugs. Some items may  interact with your medicine. What should I watch for while using this medication? Your condition will be monitored carefully while you are receiving this medication. This medication may make you feel generally unwell. This is not uncommon as chemotherapy can affect healthy cells as well as cancer cells. Report any side effects. Continue your course of treatment even though you feel ill unless your care team tells you to stop. In some cases, you may be given additional medications to help with side effects. Follow all directions for their use. This medication may increase your risk of getting an infection. Call your care team for advice if you get a fever, chills, sore throat, or other symptoms of a cold or flu. Do not treat yourself. Try to avoid being around people who are sick. This medication may increase your risk to bruise or bleed. Call your care team if you notice any unusual bleeding. Be careful brushing or flossing your teeth or using a toothpick because you may get an infection or bleed more easily. If you have any dental work done, tell your dentist you are receiving this medication. Avoid taking medications that contain aspirin, acetaminophen, ibuprofen, naproxen, or ketoprofen unless instructed by your care team. These medications may hide a fever. Do not treat diarrhea with over the counter products. Contact your care team if you have diarrhea that lasts more than 2 days or if it is severe and watery. This medication can make you more sensitive to the sun. Keep out of the sun. If you cannot avoid being in the sun, wear protective clothing and sunscreen. Do not use sun lamps, tanning beds, or tanning booths. Talk to   your care team if you or your partner wish to become pregnant or think you might be pregnant. This medication can cause serious birth defects if taken during pregnancy and for 3 months after the last dose. A reliable form of contraception is recommended while taking this  medication and for 3 months after the last dose. Talk to your care team about effective forms of contraception. Do not father a child while taking this medication and for 3 months after the last dose. Use a condom while having sex during this time period. Do not breastfeed while taking this medication. This medication may cause infertility. Talk to your care team if you are concerned about your fertility. What side effects may I notice from receiving this medication? Side effects that you should report to your care team as soon as possible: Allergic reactions--skin rash, itching, hives, swelling of the face, lips, tongue, or throat Heart attack--pain or tightness in the chest, shoulders, arms, or jaw, nausea, shortness of breath, cold or clammy skin, feeling faint or lightheaded Heart failure--shortness of breath, swelling of the ankles, feet, or hands, sudden weight gain, unusual weakness or fatigue Heart rhythm changes--fast or irregular heartbeat, dizziness, feeling faint or lightheaded, chest pain, trouble breathing High ammonia level--unusual weakness or fatigue, confusion, loss of appetite, nausea, vomiting, seizures Infection--fever, chills, cough, sore throat, wounds that don't heal, pain or trouble when passing urine, general feeling of discomfort or being unwell Low red blood cell level--unusual weakness or fatigue, dizziness, headache, trouble breathing Pain, tingling, or numbness in the hands or feet, muscle weakness, change in vision, confusion or trouble speaking, loss of balance or coordination, trouble walking, seizures Redness, swelling, and blistering of the skin over hands and feet Severe or prolonged diarrhea Unusual bruising or bleeding Side effects that usually do not require medical attention (report to your care team if they continue or are bothersome): Dry skin Headache Increased tears Nausea Pain, redness, or swelling with sores inside the mouth or throat Sensitivity  to light Vomiting This list may not describe all possible side effects. Call your doctor for medical advice about side effects. You may report side effects to FDA at 1-800-FDA-1088. Where should I keep my medication? This medication is given in a hospital or clinic. It will not be stored at home. NOTE: This sheet is a summary. It may not cover all possible information. If you have questions about this medicine, talk to your doctor, pharmacist, or health care provider.  2024 [employer] Standard (2021-06-09 00:00:00) Leucovorin Injection What is this medication? LEUCOVORIN (loo koe VOR in) prevents side effects from certain medications, such as methotrexate. It works by increasing folate levels. This helps protect healthy cells in your body. It may also be used to treat anemia caused by low levels of folate. It can also be used with fluorouracil, a type of chemotherapy, to treat colorectal cancer. It works by increasing the effects of fluorouracil in the body. This medicine may be used for other purposes; ask your health care provider or pharmacist if you have questions. What should I tell my care team before I take this medication? They need to know if you have any of these conditions: Anemia from low levels of vitamin B12 in the blood An unusual or allergic reaction to leucovorin, folic acid, other medications, foods, dyes, or preservatives Pregnant or trying to get pregnant Breastfeeding How should I use this medication? This medication is injected into a vein or a muscle. It is given by your care team   in a hospital or clinic setting. Talk to your care team about the use of this medication in children. Special care may be needed. Overdosage: If you think you have taken too much of this medicine contact a poison control center or emergency room at once. NOTE: This medicine is only for you. Do not share this medicine with others. What if I miss a dose? Keep appointments for follow-up doses.  It is important not to miss your dose. Call your care team if you are unable to keep an appointment. What may interact with this medication? Capecitabine Fluorouracil Phenobarbital Phenytoin Primidone Trimethoprim;sulfamethoxazole This list may not describe all possible interactions. Give your health care provider a list of all the medicines, herbs, non-prescription drugs, or dietary supplements you use. Also tell them if you smoke, drink alcohol, or use illegal drugs. Some items may interact with your medicine. What should I watch for while using this medication? Your condition will be monitored carefully while you are receiving this medication. This medication may increase the side effects of 5-fluorouracil. Tell your care team if you have diarrhea or mouth sores that do not get better or that get worse. What side effects may I notice from receiving this medication? Side effects that you should report to your care team as soon as possible: Allergic reactions--skin rash, itching, hives, swelling of the face, lips, tongue, or throat This list may not describe all possible side effects. Call your doctor for medical advice about side effects. You may report side effects to FDA at 1-800-FDA-1088. Where should I keep my medication? This medication is given in a hospital or clinic. It will not be stored at home. NOTE: This sheet is a summary. It may not cover all possible information. If you have questions about this medicine, talk to your doctor, pharmacist, or health care provider.  2024 [employer] Standard (2021-07-07 00:00:00) Nivolumab Injection What is this medication? NIVOLUMAB (nye VOL ue mab) treats some types of cancer. It works by helping your immune system slow or stop the spread of cancer cells. It is a monoclonal antibody. This medicine may be used for other purposes; ask your health care provider or pharmacist if you have questions. COMMON BRAND NAME(S): Opdivo What should I  tell my care team before I take this medication? They need to know if you have any of these conditions: Allogeneic stem cell transplant (uses someone else's stem cells) Autoimmune diseases, such as Crohn disease, ulcerative colitis, lupus History of chest radiation Nervous system problems, such as Guillain-Barre syndrome or myasthenia gravis Organ transplant An unusual or allergic reaction to nivolumab, other medications, foods, dyes, or preservatives Pregnant or trying to get pregnant Breast-feeding How should I use this medication? This medication is infused into a vein. It is given in a hospital or clinic setting. A special MedGuide will be given to you before each treatment. Be sure to read this information carefully each time. Talk to your care team about the use of this medication in children. While it may be prescribed for children as young as 12 years for selected conditions, precautions do apply. Overdosage: If you think you have taken too much of this medicine contact a poison control center or emergency room at once. NOTE: This medicine is only for you. Do not share this medicine with others. What if I miss a dose? Keep appointments for follow-up doses. It is important not to miss your dose. Call your care team if you are unable to keep an appointment. What may   interact with this medication? Interactions have not been studied. This list may not describe all possible interactions. Give your health care provider a list of all the medicines, herbs, non-prescription drugs, or dietary supplements you use. Also tell them if you smoke, drink alcohol, or use illegal drugs. Some items may interact with your medicine. What should I watch for while using this medication? Your condition will be monitored carefully while you are receiving this medication. You may need blood work while taking this medication. This medication may cause serious skin reactions. They can happen weeks to months after  starting the medication. Contact your care team right away if you notice fevers or flu-like symptoms with a rash. The rash may be red or purple and then turn into blisters or peeling of the skin. You may also notice a red rash with swelling of the face, lips, or lymph nodes in your neck or under your arms. Tell your care team right away if you have any change in your eyesight. Talk to your care team if you are pregnant or think you might be pregnant. A negative pregnancy test is required before starting this medication. A reliable form of contraception is recommended while taking this medication and for 5 months after the last dose. Talk to your care team about effective forms of contraception. Do not breast-feed while taking this medication and for 5 months after the last dose. What side effects may I notice from receiving this medication? Side effects that you should report to your care team as soon as possible: Allergic reactions--skin rash, itching, hives, swelling of the face, lips, tongue, or throat Dry cough, shortness of breath or trouble breathing Eye pain, redness, irritation, or discharge with blurry or decreased vision Heart muscle inflammation--unusual weakness or fatigue, shortness of breath, chest pain, fast or irregular heartbeat, dizziness, swelling of the ankles, feet, or hands Hormone gland problems--headache, sensitivity to light, unusual weakness or fatigue, dizziness, fast or irregular heartbeat, increased sensitivity to cold or heat, excessive sweating, constipation, hair loss, increased thirst or amount of urine, tremors or shaking, irritability Infusion reactions--chest pain, shortness of breath or trouble breathing, feeling faint or lightheaded Kidney injury (glomerulonephritis)--decrease in the amount of urine, red or dark brown urine, foamy or bubbly urine, swelling of the ankles, hands, or feet Liver injury--right upper belly pain, loss of appetite, nausea, light-colored  stool, dark yellow or brown urine, yellowing skin or eyes, unusual weakness or fatigue Pain, tingling, or numbness in the hands or feet, muscle weakness, change in vision, confusion or trouble speaking, loss of balance or coordination, trouble walking, seizures Rash, fever, and swollen lymph nodes Redness, blistering, peeling, or loosening of the skin, including inside the mouth Sudden or severe stomach pain, bloody diarrhea, fever, nausea, vomiting Side effects that usually do not require medical attention (report these to your care team if they continue or are bothersome): Bone, joint, or muscle pain Diarrhea Fatigue Loss of appetite Nausea Skin rash This list may not describe all possible side effects. Call your doctor for medical advice about side effects. You may report side effects to FDA at 1-800-FDA-1088. Where should I keep my medication? This medication is given in a hospital or clinic. It will not be stored at home. NOTE: This sheet is a summary. It may not cover all possible information. If you have questions about this medicine, talk to your doctor, pharmacist, or health care provider.  2024 [employer] Standard (2021-06-01 00:00:00)  

## 2022-08-26 ENCOUNTER — Encounter: Payer: Self-pay | Admitting: Oncology

## 2022-08-26 NOTE — Progress Notes (Signed)
[facility]  [address] [place],  [place]  [address] [phone]  Clinic Day:09/02/22   Referring physician: Philemon Kingdom, MD  ASSESSMENT & PLAN:  Gastric adenocarcinoma This is a poorly differentiated adenocarcinoma with signet ring features and ulceration. Stain for HER2 was negative at 0. I would consider it as metastatic to the omentum and so that would stage this as a T4 N0 M1, stage IVB.  She has been on treatment with chemotherapy with FOLFOX and nivolumab for 10 cycles with very good response, but then had increasing dermatologic toxicities and neuropathy. Her PET scan from 03/22/2022 shows a new area of hypermetabolic activity in the right lower lobe of peripheral consolidation, which is likely atelectasis or resolving pneumonia measuring 3cm. The thickening of her stomach has decreased with decreased metabolic activity and there is shrinkage of the omental metastasis from 22 mm to 13 mm. She had a severe rash and fever and was admitted to the hospital, and she was also found to have pneumonia and a UTI at that time. We are not sure which medication caused her rashes. Dr. Jennye Boroughs did a EGD on March 5th and found persistent poorly differentiated adenocarcinoma and a 3 cm mass of the distal body of the stomach. Since her current treatment is palliating her disease, we will continue. She also had a CT chest, abdomen, and pelvis on 08/30/2022 that revealed mild persistent gastric wall thickening with subtle adjacent omental nodularity, consistent with treated gastric malignancy,    Immune Mediated Nephritis Her creatinine is improved with normal saline IV and she was encouraged to drink more fluids. Renal ultrasound was normal. We felt this was caused by her immunotherapy and placed her on Prednisone for a short course and low dose, which was effective. I tapered her off completely as of the end of April. Her creatinine went up a little bit last  time to 1.27 and then 1.5 two weeks ago. I therefore gave her IV fluids and rechecked last week and it was down to 1.2 and remains stable today at 1.2. We will continue IV fluids with her chemotherapy treatments.     Plan She had a X-ray of her left shoulder on 08/23/2022 that revealed arthritis of the left should er with no acute or destructive bony abnormality. She also had a CT chest, abdomen, and pelvis on 08/30/2022 that revealed mild persistent gastric wall thickening with subtle adjacent omental nodularity, consistent with treated gastric malignancy, no evidence of lymphadenopathy or distant metastatic disease in the chest, abdomen, or pelvis. There is unchanged, asymmetric fibrosis and bronchiectasis of the right lung base, most likely related to infection or aspiration given asymmetric distribution. Patients day 1 cycle 20 of FOLFOX and Nivolumab is scheduled on 09/06/2022 and day 3 pump D/C, cycle 30 is scheduled on 09/08/2022. We will continue IV fluids along with her treatments. Her daughter informed me that after her last 2 treatments her temperature was mildly elevated and she gave her tylenol. Her CBC and thyroid levels are normal, CMP nearly normal with a elevated BUN of 26 and creatinine of 1.20 as of today. [name] back in 2 weeks with CBC, CMP, TSH, and T4.   I reviewed all of this with her and her daughter and answered their questions. She understands and agrees with this plan   I provided 23 minutes of face-to-face time during this this encounter and > 50% was spent counseling as documented under my assessment and plan.  Dellia Beckwith, MD [facility] [address] [place] [address] Dept: [phone] Dept Fax: [phone]     CHIEF COMPLAINT:  CC: Gastric cancer  Current Treatment: Chemotherapy/immunotherapy  HISTORY OF PRESENT ILLNESS:  FLORIA ROWLEE is a 87 y.o. female with a history of  gastric cancer who is referred in consultation with Dr. Shara Blazing for assessment and management.  She had noticed that she was having regurgitation when eating and had lost over 30 pounds.  An ultrasound was done, revealing hepatic steatosis, and led to an MRI scan on June 5 which revealed gastric wall thickening with confluent nodularity of the omentum anterior to the stomach measuring 3.5 cm consistent with metastatic tumor.  She also had low-grade edema and wall thickening extending into the duodenum from the stomach.  She was referred to Dr. Shara Blazing and he did an EGD on July 7.  This revealed a large ulceration measuring 1.2 cm along the greater curvature.  She also had diffusely edematous and erythematous wall with erosions of the antrum and stiff and friable mucosa with oozing of blood.  These findings extended to the gastric fundus as well.  Pathology revealed a poorly differentiated adenocarcinoma with signet ring features from the biopsies of the ulcer as well as the antrum and the fundus of the stomach.  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  She was placed on omeprazole.  Her test for H. pylori was negative.  She was referred to Dr. Hardin Negus for consideration of surgery but he felt this was not resectable because of the extensive involvement and I agree.  I would consider this extending to the duodenum and also metastatic to the omentum.  PET scan confirmed these findings and she wished to pursue systemic intravenous therapy.  She continues to eat and has adjusted her diet to softer foods and liquids and is drinking boost so she has maintained her weight.  She does have some Zofran ODT for nausea when needed.  She has had some anorexia, nausea, occasional vomiting, early satiety, dysphagia, and belching.  A CEA and CA 19-9 were normal. She has been started on FOLFOX chemotherapy along with immunotherapy.  I have reviewed her chart  and materials related to her cancer extensively and collaborated history with the patient. Summary of oncologic history is as follows: Oncology History  Gastric cancer (HCC)  09/10/2021 Initial Diagnosis   Gastric cancer (HCC)   09/10/2021 Cancer Staging   Staging form: Stomach, AJCC 8th Edition - Clinical stage from 09/10/2021: Stage IVB (cT4b, cN0, cM1) - Signed by Dellia Beckwith, MD on 09/10/2021 Histopathologic type: Adenocarcinoma, NOS Stage prefix: Initial diagnosis Total positive nodes: 0 Histologic grade (G): G3 Histologic grading system: 3 grade system Sites of metastasis: Peritoneal surface Diagnostic confirmation: Positive histology PLUS positive immunophenotyping and/or positive genetic studies Specimen type: Endoscopy with Biopsy Staged by: Managing physician Carcinoembryonic antigen (CEA) (ng/mL): 2.8 Carbohydrate antigen 19-9 (CA 19-9) (U/mL): 4.9 HER2 status: Unknown Microsatellite instability (MSI): Unknown Tumor location in stomach: Other Clinical staging modalities: Biopsy, Endoscopy Stage used in treatment planning: Yes National guidelines used in treatment planning: Yes Type of national guideline used in treatment planning: NCCN   09/28/2021 - 10/14/2021 Chemotherapy   Patient is on Treatment Plan : GASTROESOPHAGEAL FOLFOX + Nivolumab q14d     09/28/2021 -  Chemotherapy   Patient is on Treatment Plan : GASTROESOPHAGEAL FOLFOX + Nivolumab q14d  12/09/2021 Genetic Testing   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  Report date is 12/09/2021.   The Multi-Cancer + RNA Panel offered by [facility] includes sequencing and/or deletion/duplication analysis of the following 84 genes:  AIP*, ALK, APC*, ATM*, AXIN2*, BAP1*, BARD1*, BLM*, BMPR1A*, BRCA1*, BRCA2*, BRIP1*, CASR, CDC73*, CDH1*, CDK4, CDKN1B*, CDKN1C*, CDKN2A, CEBPA, CHEK2*, CTNNA1*, DICER1*, DIS3L2*, EGFR, EPCAM, FH*, FLCN*, GATA2*, GPC3, GREM1, HOXB13, HRAS, KIT,  MAX*, MEN1*, MET, MITF, MLH1*, MSH2*, MSH3*, MSH6*, MUTYH*, NBN*, NF1*, NF2*, NTHL1*, PALB2*, PDGFRA, PHOX2B, PMS2*, POLD1*, POLE*, POT1*, PRKAR1A*, PTCH1*, PTEN*, RAD50*, RAD51C*, RAD51D*, RB1*, RECQL4, RET, RUNX1*, SDHA*, SDHAF2*, SDHB*, SDHC*, SDHD*, SMAD4*, SMARCA4*, SMARCB1*, SMARCE1*, STK11*, SUFU*, TERC, TERT, TMEM127*, Tp53*, TSC1*, TSC2*, VHL*, WRN*, and WT1.  RNA analysis is performed for * genes.     INTERVAL HISTORY:  Tymeshia is here today for a follow-up for her gastric cancer, she continues maintenance treatment with 5FU, Leucovorin, and Nivolumab. Patient states that she feels well and complains of left shoulder pain rating 5/10 and constipation relieved with MiraLAX. She had an X-ray of her left shoulder on 08/23/2022 that revealed arthritis of the left shoulder with no acute or destructive bony abnormality. She also had a CT chest, abdomen, and pelvis on 08/30/2022 that revealed mild persistent gastric wall thickening with subtle adjacent omental nodularity, consistent with treated gastric malignancy, no evidence of lymphadenopathy or distant metastatic disease in the chest, abdomen, or pelvis. There is unchanged, asymmetric fibrosis and bronchiectasis of the right lung base, most likely related to infection or aspiration given asymmetric distribution. Patient's day 1 cycle 20 of FOLFOX and Nivolumab is scheduled on 09/06/2022 and day 3 pump D/C, cycle 30 is scheduled on 09/08/2022. Her daughter informed me that after her last 2 treatments her temperature was mildly elevated and she gave her tylenol. Her CBC and thyroid levels are normal, CMP nearly normal with a elevated BUN of 26 and creatinine of 1.20 as of today. [name] back in 2 weeks with CBC, CMP, TSH, and T4.   She denies signs of infection such as sore throat, sinus drainage, cough, or urinary symptoms.  She denies fevers or recurrent chills. She denies pain. She denies nausea, vomiting, chest pain, dyspnea or cough. Her  appetite is good and her weight has decreased 1 pounds over last 8 days .She is accompanied at today's visit by her daughter.   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  If that happens, she is to call us so that we can schedule her for an ultrasound to look for synovitis.     Essential hypertension 09/09/2016   Gastric cancer (HCC) 09/10/2021   Hyperlipidemia 09/01/2016   Hypertension    Hypothyroidism 09/01/2016   Osteoarthritis of both feet 01/20/2016   Osteoarthritis, hand 01/20/2016   Thyroid disease   Degenerative disc disease History of endometriosis  Past Surgical History:  Procedure Laterality Date   APPENDECTOMY     LAPAROSCOPIC APPENDECTOMY N/A  04/10/2016   Procedure: APPENDECTOMY LAPAROSCOPIC;  Surgeon: Abigail Miyamoto, MD;  Location: [facility] OR;  Service: General;  Laterality: N/A;  Bilateral tubal ligation Total hysterectomy and bilateral salpingo-oophorectomy in 1980  Family History  Problem Relation Age of Onset   Hypertension Mother    Prostate cancer Father        metastatic; d. 50   Brain cancer Sister 11   Breast cancer Sister 75   AAA (abdominal aortic aneurysm) Brother    Leukemia Cousin        x2 maternal female cousins; d. before 96   Breast cancer Daughter 64       DCIS  Her sister has had breast cancer as well as a tumor of her head Her daughter has had breast cancer last year  Social History:  reports that she has never  smoked. She has never used smokeless tobacco. She reports that she does not currently use alcohol. She reports that she does not use drugs.The patient is accompanied by her son, daughter-in-law and daughter today.  She is single but lives with a significant other.  She has the 2 children.  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  She grew up in [place] and [place].  She is active and healthy, especially for her age.  Allergies:  Allergies  Allergen Reactions   Doxycycline Rash   Sulfa Antibiotics Rash and Other (See Comments)    Other reaction(s): Other (See Comments)  "Made me feel weird"    Current Medications: Current Outpatient Medications  Medication Sig Dispense Refill   amoxicillin-clavulanate (AUGMENTIN) 875-125 MG tablet Take 1 tablet by mouth 2 (two) times daily for 10 days. 20 tablet 0   aspirin 81 MG EC tablet Take 81 mg by mouth daily. Swallow whole.     b complex vitamins capsule Take 1 capsule by mouth daily.     Calcium Carbonate (CALCIUM 600 PO) Take 1 tablet by mouth daily.     Cholecalciferol (VITAMIN D3) 5000 units CAPS Take 1 capsule by mouth daily.     EXFORGE HCT 5-160-12.5 MG TABS Take 1 tablet by mouth daily.     famotidine (PEPCID) 40 MG tablet Take 40 mg by mouth at bedtime.     KRILL OIL PO Take 1 capsule by mouth daily. Unknown strenght     Lactobacillus TABS Take 1 tablet by mouth 2 (two) times daily.     levothyroxine (SYNTHROID, LEVOTHROID) 75 MCG tablet Take 75 mcg by mouth daily before breakfast.      magic mouthwash SOLN SWISH AND SWALLOW BY MOUTH EVERY THREE TO FOUR HOURS     NON FORMULARY MMW: 3 parts Maalox 2 parts Benadryl 1 part viscious lidicaine  Disp. 6oz  Instructions: 5ml swish and swallow every 3-4 hours     omeprazole (PRILOSEC) 40 MG capsule Take 1 capsule (40 mg total) by mouth 2 (two) times daily. 60 capsule 5   ondansetron (ZOFRAN-ODT) 4 MG disintegrating tablet Take 1 tablet (4 mg total) by mouth every  8 (eight) hours as needed for nausea or vomiting. 90 tablet 0   polyethylene glycol powder (GLYCOLAX/MIRALAX) 17 GM/SCOOP powder SMARTSIG:1 scoopful By Mouth Daily     potassium chloride SA (KLOR-CON M) 20 MEQ tablet TAKE ONE TABLET BY MOUTH THREE TIMES DAILY 90 tablet 5   pravastatin (PRAVACHOL) 20 MG tablet Take 1 tablet (20 mg total) by mouth every evening. 90 tablet 3   Probiotic Product (PROBIOTIC DAILY PO) Take 1  tablet by mouth daily.     prochlorperazine (COMPAZINE) 10 MG tablet Take 1 tablet (10 mg total) by mouth every 6 (six) hours as needed for nausea or vomiting. 90 tablet 3   triamcinolone 0.1% oint-Eucerin equivalent cream 1:1 mixture Apply topically 2 (two) times daily. 160 g 0   triamcinolone cream (KENALOG) 0.1 % Apply to affected skin twice daily (Patient taking differently: daily as needed. Apply to affected skin twice daily) 160 g 0   No current facility-administered medications for this visit.   REVIEW OF SYSTEMS:  Review of Systems  Constitutional: Negative.  Negative for appetite change, chills, diaphoresis, fatigue, fever and unexpected weight change.  HENT:   Negative for hearing loss, lump/mass, mouth sores, nosebleeds, sore throat, tinnitus, trouble swallowing and voice change.   Eyes:  Negative for eye problems and icterus.  Respiratory: Negative.  Negative for chest tightness, cough, hemoptysis, shortness of breath and wheezing.   Cardiovascular: Negative.  Negative for chest pain, leg swelling and palpitations.  Gastrointestinal:  Positive for constipation. Negative for abdominal distention, abdominal pain, blood in stool, diarrhea, nausea, rectal pain and vomiting.  Endocrine: Negative.  Negative for hot flashes.  Genitourinary: Negative.  Negative for bladder incontinence, difficulty urinating, dyspareunia, dysuria, frequency, hematuria, menstrual problem, nocturia, pelvic pain, vaginal bleeding and vaginal discharge.   Musculoskeletal: Negative.  Negative for  arthralgias, back pain, flank pain, gait problem, myalgias, neck pain and neck stiffness.       Left shoulder pain in the scapula area rating 5/10. (Constant and burning)  Skin: Negative.  Negative for itching, rash and wound.       Occasional blotchy discoloration of her lower extremities, but much improved  Neurological:  Positive for numbness (in her fingers and toes). Negative for dizziness, extremity weakness, gait problem, headaches, light-headedness, seizures and speech difficulty.  Hematological:  Negative for adenopathy. Bruises/bleeds easily.  Psychiatric/Behavioral: Negative.  Negative for confusion, decreased concentration, depression, sleep disturbance and suicidal ideas. The patient is not nervous/anxious.    VITALS:  Blood pressure 139/63, pulse 99, temperature 98 F (36.7 C), temperature source Oral, resp. rate 18, height 5' (1.524 m), weight 203 lb 8 oz (92.3 kg), SpO2 94%.  Wt Readings from Last 3 Encounters:  09/16/22 203 lb (92.1 kg)  09/08/22 203 lb (92.1 kg)  09/06/22 203 lb 1.3 oz (92.1 kg)    Body mass index is 39.74 kg/m.  Performance status (ECOG): 1 - Symptomatic but completely ambulatory  PHYSICAL EXAM:  Physical Exam Vitals and nursing note reviewed. Exam conducted with a chaperone present.  Constitutional:      General: She is not in acute distress.    Appearance: Normal appearance. She is normal weight. She is not ill-appearing, toxic-appearing or diaphoretic.  HENT:     Head: Normocephalic and atraumatic.     Right Ear: Tympanic membrane, ear canal and external ear normal. There is no impacted cerumen.     Left Ear: Tympanic membrane, ear canal and external ear normal. There is no impacted cerumen.     Nose: Nose normal. No congestion or rhinorrhea.     Mouth/Throat:     Mouth: Mucous membranes are moist.     Pharynx: Oropharynx is clear. No oropharyngeal exudate or posterior oropharyngeal erythema.  Eyes:     General: No scleral icterus.        Right eye: No discharge.        Left eye: No discharge.     Extraocular Movements: Extraocular movements intact.  Conjunctiva/sclera: Conjunctivae normal.     Pupils: Pupils are equal, round, and reactive to light.  Neck:     Vascular: No carotid bruit.  Cardiovascular:     Rate and Rhythm: Normal rate and regular rhythm.     Pulses: Normal pulses.     Heart sounds: Normal heart sounds. No murmur heard.    No friction rub. No gallop.  Pulmonary:     Effort: Pulmonary effort is normal. No respiratory distress.     Breath sounds: Normal breath sounds. No stridor. No wheezing, rhonchi or rales.  Chest:     Chest wall: No tenderness.  Abdominal:     General: Bowel sounds are normal. There is no distension.     Palpations: Abdomen is soft. There is no hepatomegaly, splenomegaly or mass.     Tenderness: There is no abdominal tenderness. There is no right CVA tenderness, left CVA tenderness, guarding or rebound.     Hernia: No hernia is present.     Comments: Slight firmness in the epigastrium with deep palpation.   Musculoskeletal:        General: No swelling, tenderness, deformity or signs of injury.     Left shoulder: Normal range of motion.     Cervical back: Normal range of motion and neck supple. No rigidity or tenderness.     Right lower leg: 1+ Edema present.     Left lower leg: 1+ Edema present.  Lymphadenopathy:     Cervical: No cervical adenopathy.     Right cervical: No superficial, deep or posterior cervical adenopathy.    Left cervical: No superficial, deep or posterior cervical adenopathy.     Upper Body:     Right upper body: No supraclavicular, axillary or pectoral adenopathy.     Left upper body: No supraclavicular, axillary or pectoral adenopathy.  Skin:    General: Skin is warm and dry.     Coloration: Skin is not jaundiced or pale.     Findings: No bruising, erythema, lesion or rash.     Comments: Rash on her legs is faded and nearly resolved  Neurological:      General: No focal deficit present.     Mental Status: She is alert and oriented to person, place, and time. Mental status is at baseline.     Cranial Nerves: No cranial nerve deficit.     Sensory: No sensory deficit.     Motor: No weakness.     Coordination: Coordination normal.     Gait: Gait normal.     Deep Tendon Reflexes: Reflexes normal.  Psychiatric:        Mood and Affect: Mood normal.        Behavior: Behavior normal.        Thought Content: Thought content normal.        Judgment: Judgment normal.    LABS:            Component Ref Range & Units 3 d ago (08/30/22) 10 d ago (08/23/22) 4 wk ago (08/05/22) 1 mo ago (07/22/22) 1 mo ago (07/08/22) 2 mo ago (06/25/22) 2 mo ago (06/10/22)  Hemoglobin 12.0 - 16.0 13.0 12.3 12.5 12.1 11.7 Abnormal  12.1 11.6 Abnormal   HCT 36 - 46 38 35 Abnormal  36 34 Abnormal  33 Abnormal  35 Abnormal  33 Abnormal   Neutrophils Absolute 4.50 5.42 3.35 4.98 3.37 3.10 4.93  Platelets 150 - 400 K/uL 185 162 199 219 203 217 231  WBC 7.5  9.5 6.7 9.4 6.6 6.6 7.7              Component Ref Range & Units 3 d ago (08/30/22) 10 d ago (08/23/22) 4 wk ago (08/05/22) 1 mo ago (07/22/22) 1 mo ago (07/08/22) 2 mo ago (06/30/22) 2 mo ago (06/25/22)  Glucose 105 111 153 138 126 104 127  BUN 4 - 21 26 Abnormal  25 Abnormal  21 25 Abnormal  19 26 Abnormal  20  CO2 13 - 22 27 Abnormal  22 23 Abnormal  19 24 Abnormal  22 24 Abnormal   Creatinine 0.5 - 1.1 1.2 Abnormal  1.3 Abnormal  1.2 Abnormal  1.3 Abnormal  1.3 Abnormal  1.2 Abnormal  1.5 Abnormal   Potassium 3.5 - 5.1 mEq/L 3.7 4.1 3.9 3.8 3.8 3.8 4.6  Sodium 137 - 147 137 138 138 139 138 139 139  Chloride 99 - 108 104 105 105 106 108 107 106     Component Ref Range & Units 3 d ago (08/30/22) 10 d ago (08/23/22) 4 wk ago (08/05/22) 1 mo ago (07/22/22) 1 mo ago (07/08/22) 2 mo ago (06/30/22) 2 mo ago (06/25/22)  Calcium 8.7 - 10.7 9.5 9.4 9.8 9.4 9.2 9.5 9.9  Albumin 3.5 - 5.0 4.3 4.4 4.3 4.3 4.3   4.3             Component Ref Range & Units 3 d ago (08/30/22) 10 d ago (08/23/22) 4 wk ago (08/05/22) 1 mo ago (07/22/22) 1 mo ago (07/08/22) 2 mo ago (06/25/22) 2 mo ago (06/16/22)  Alkaline Phosphatase 25 - 125 72 72 66 63 69 63 41 R  ALT 7 - 35 U/L 17 18 19 17 16 19 19  R  AST 13 - 35 25 29 31 28 24 30 21  R  Bilirubin, Total 0.8 0.7 0.7 0.5 0.5 0.6               Component Ref Range & Units 3 d ago (08/30/22) 10 d ago (08/23/22) 4 wk ago (08/05/22) 1 mo ago (07/22/22) 1 mo ago (07/08/22) 2 mo ago (06/25/22) 2 mo ago (06/10/22)  TSH 0.41 - 5.90 1.46 1.953 R, CM 2.443 R, CM 2.035 R, CM 2.103 R, CM 2.455 R, CM 2.595 R, CM        Component 08/30/22  Free T4 1.74        Component 08/30/22  Free T3 4.33        Latest Ref Rng & Units 08/30/2022   12:00 AM 08/23/2022   12:00 AM 08/05/2022   12:00 AM  CBC  WBC  7.5     9.5     6.7      Hemoglobin 12.0 - 16.0 13.0     12.3     12.5      Hematocrit 36 - 46 38     35     36      Platelets 150 - 400 K/uL 185     162     199         This result is from an external source.      Latest Ref Rng & Units 08/30/2022   12:00 AM 08/23/2022   12:00 AM 08/05/2022   12:00 AM  CMP  BUN 4 - 21 26     25     21       Creatinine 0.5 - 1.1 1.2     1.3     1.2  Sodium 137 - 147 137     138     138      Potassium 3.5 - 5.1 mEq/L 3.7     4.1     3.9      Chloride 99 - 108 104     105     105      CO2 13 - 22 27     22     23       Calcium 8.7 - 10.7 9.5     9.4     9.8      Alkaline Phos 25 - 125 72     72     66      AST 13 - 35 25     29     31       ALT 7 - 35 U/L 17     18     19          This result is from an external source.   Lab Results  Component Value Date   CEA1 2.9 09/10/2021   /  CEA  Date Value Ref Range Status  09/10/2021 2.9 0.0 - 4.7 ng/mL Final    Comment:    (NOTE)                             Nonsmokers          <3.9                             Smokers             <5.6 [employer] Electrochemiluminescence  Immunoassay (ECLIA) Values obtained with different assay methods or kits cannot be used interchangeably.  Results cannot be interpreted as absolute evidence of the presence or absence of malignant disease. Performed At: [facility] [address] [place], [place] [id number] Jolene Schimke MD ZD:[id number]    Component Ref Range & Units 06/10/22  Vitamin B-12 180 - 914 pg/mL 302   Component Ref Range & Units 06/10/22 2 yr ago 3 yr ago  Cholesterol 0 - 200 mg/dL 595    Triglycerides <638 mg/dL 756 433 R 295 High  R  HDL >40 mg/dL 77 64 R 62 R  Total CHOL/HDL Ratio RATIO 2.3 2.8 R, CM 3.3 R, CM  VLDL 0 - 40 mg/dL 29    LDL Cholesterol 0 - 99 mg/dL 70     No results found for: "PSA1" No results found for: "JOA416" No results found for: "CAN125"  No results found for: "TOTALPROTELP", "ALBUMINELP", "A1GS", "A2GS", "BETS", "BETA2SER", "GAMS", "MSPIKE", "SPEI" No results found for: "TIBC", "FERRITIN", "IRONPCTSAT" No results found for: "LDH"  STUDIES:  Exam: 08/30/2022 CT Chest, Abdomen, and Pelvis Impression: Mild, persistent gastric wall thickening with subtle adjacent omental nodularity, consistent with treated gastric malignancy. No evidence of lymphadenopathy or distant metastatic disease in the chest, abdomen, or pelvis.  Unchanged, asymmetric fibrosis and bronchiectasis of the right lung base, most likely related to infection or aspiration given asymmetric distribution. Attention on follow-up. Aortic Atherosclerosis (ICD10-170.0)  Exam: 08/23/2022 Left Shoulder- 2+ View Impression Degenerative changes of the left shoulder. No acute or destructive bony abnormality.  Echocardiogram: 07/02/2022 IMPRESSIONS:  1. Left ventricular ejection fraction, by estimation, is 60 to 65%. The  left ventricle has normal function. The left ventricle has no regional  wall motion abnormalities. Left ventricular diastolic  parameters are  consistent with Grade I diastolic   dysfunction (impaired relaxation).GLS-18.3%   2. Right ventricular systolic function is normal. The right ventricular  size is normal. There is normal pulmonary artery systolic pressure.   3. Left atrial size was mildly dilated.   4. The mitral valve is normal in structure. Moderate mitral valve  regurgitation. No evidence of mitral stenosis.   5. The aortic valve is normal in structure. Aortic valve regurgitation is  not visualized. No aortic stenosis is present.   6. The inferior vena cava is normal in size with greater than 50%  respiratory variability, suggesting right atrial pressure of 3 mmHg.    EXAM: 03/22/2022 NUCLEAR MEDICINE PET SKULL BASE TO THIGH IMPRESSION: 1. Resolution of metabolic activity associated with the stomach. 2. Decrease in size of omental nodularity. No associated metabolic activity. 3. No evidence of new or progressive gastric carcinoma. 4. New peripheral consolidation in the RIGHT lower lobe with moderate metabolic activity. Favor focus of atelectasis versus less likely pneumonia. 5.  Aortic Atherosclerosis (ICD10-I70.0).     I,Jasmine M Lassiter,acting as a scribe for Dellia Beckwith, MD.,have documented all relevant documentation on the behalf of Dellia Beckwith, MD,as directed by  Dellia Beckwith, MD while in the presence of Dellia Beckwith, MD.

## 2022-08-27 ENCOUNTER — Inpatient Hospital Stay: Payer: Medicare Other

## 2022-08-27 VITALS — BP 140/62 | HR 80 | Temp 98.0°F | Resp 18 | Ht 60.0 in | Wt 203.0 lb

## 2022-08-27 DIAGNOSIS — C169 Malignant neoplasm of stomach, unspecified: Secondary | ICD-10-CM | POA: Diagnosis not present

## 2022-08-27 DIAGNOSIS — C786 Secondary malignant neoplasm of retroperitoneum and peritoneum: Secondary | ICD-10-CM | POA: Diagnosis not present

## 2022-08-27 DIAGNOSIS — Z79899 Other long term (current) drug therapy: Secondary | ICD-10-CM | POA: Diagnosis not present

## 2022-08-27 DIAGNOSIS — N12 Tubulo-interstitial nephritis, not specified as acute or chronic: Secondary | ICD-10-CM | POA: Diagnosis not present

## 2022-08-27 DIAGNOSIS — M25512 Pain in left shoulder: Secondary | ICD-10-CM | POA: Diagnosis not present

## 2022-08-27 DIAGNOSIS — Z5111 Encounter for antineoplastic chemotherapy: Secondary | ICD-10-CM | POA: Diagnosis not present

## 2022-08-27 DIAGNOSIS — C168 Malignant neoplasm of overlapping sites of stomach: Secondary | ICD-10-CM

## 2022-08-27 MED ORDER — HEPARIN SOD (PORK) LOCK FLUSH 100 UNIT/ML IV SOLN
500.0000 [IU] | Freq: Once | INTRAVENOUS | Status: AC | PRN
  Administered 2022-08-27: 500 [IU]

## 2022-08-27 MED ORDER — SODIUM CHLORIDE 0.9% FLUSH
10.0000 mL | INTRAVENOUS | Status: DC | PRN
  Administered 2022-08-27: 10 mL

## 2022-08-27 MED ORDER — SODIUM CHLORIDE 0.9 % IV SOLN: Freq: Once | INTRAVENOUS | Status: AC

## 2022-08-27 NOTE — Patient Instructions (Signed)
Dehydration, Adult Dehydration is a condition in which there is not enough water or other fluids in the body. This happens when a person loses more fluids than they take in. Important organs cannot work right without the right amount of fluids. Any loss of fluids from the body can cause dehydration. Dehydration can be mild, worse, or very bad. It should be treated right away to keep it from getting very bad. What are the causes? Conditions that cause loss of water in the body. They include: Watery poop (diarrhea). Vomiting. Sweating a lot. Fever. Infection. Peeing (urinating) a lot. Not drinking enough fluids. Certain medicines, such as medicines that take extra fluid out of the body (diuretics). Lack of safe drinking water. Not being able to get enough water and food. What increases the risk? Having a long-term (chronic) illness that has not been treated the right way, such as: Diabetes. Heart disease. Kidney disease. Being 73 years of age or older. Having a disability. Living in a place that is high above the ground or sea (high in altitude). The thinner, drier air causes more fluid loss. Doing exercises that put stress on your body for a long time. Being active when in hot places. What are the signs or symptoms? Symptoms of dehydration depend on how bad it is. Mild or worse dehydration Thirst. Dry lips or dry mouth. Feeling dizzy or light-headed. Muscle cramps. Passing little pee or dark pee. Pee may be the color of tea. Headache. Very bad dehydration Changes in skin. Skin may: Be cold to the touch (clammy). Be blotchy or pale. Not go back to normal right after you pinch it and let it go. Little or no tears, pee, or sweat. Fast breathing. Low blood pressure. Weak pulse. Pulse that is more than 100 beats a minute when you are sitting still. Other changes, such as: Feeling very thirsty. Eyes that look hollow (sunken). Cold hands and feet. Being confused. Being very  tired (lethargic) or having trouble waking from sleep. Losing weight. Loss of consciousness. How is this treated? Treatment for this condition depends on how bad your dehydration is. Treatment should start right away. Do not wait until your condition gets very bad. Very bad dehydration is an emergency. You will need to go to a hospital. Mild or worse dehydration can be treated at home. You may be asked to: Drink more fluids. Drink an oral rehydration solution (ORS). This drink gives you the right amount of fluids, salts, and minerals (electrolytes). Very bad dehydration can be treated: With fluids through an IV tube. By correcting low levels of electrolytes in the body. By treating the problem that caused your dehydration. Follow these instructions at home: Oral rehydration solution If told by your doctor, drink an ORS: Make an ORS. Use instructions on the package. Start by drinking small amounts, about  cup (120 mL) every 5-10 minutes. Slowly drink more until you have had the amount that your doctor said to have.  Eating and drinking  Drink enough clear fluid to keep your pee pale yellow. If you were told to drink an ORS, finish the ORS first. Then, start slowly drinking other clear fluids. Drink fluids such as: Water. Do not drink only water. Doing that can make the salt (sodium) level in your body get too low. Water from ice chips you suck on. Fruit juice that you have added water to (diluted). Low-calorie sports drinks. Eat foods that have the right amounts of salts and minerals, such as bananas, oranges, potatoes,  tomatoes, or spinach. Do not drink alcohol. Avoid drinks that have caffeine or sugar. These include:: High-calorie sports drinks. Fruit juice that you did not add water to. Soda. Coffee or energy drinks. Avoid foods that are greasy or have a lot of fat or sugar. General instructions Take over-the-counter and prescription medicines only as told by your doctor. Do  not take sodium tablets. Doing that can make the salt level in your body get too high. Return to your normal activities as told by your doctor. Ask your doctor what activities are safe for you. Keep all follow-up visits. Your doctor may check and change your treatment. Contact a doctor if: You have pain in your belly (abdomen) and the pain: Gets worse. Stays in one place. You have a rash. You have a stiff neck. You get angry or annoyed more easily than normal. You are more tired or have a harder time waking than normal. You feel weak or dizzy. You feel very thirsty. Get help right away if: You have any symptoms of very bad dehydration. You vomit every time you eat or drink. Your vomiting gets worse, does not go away, or you vomit blood or green stuff. You are getting treatment, but symptoms are getting worse. You have a fever. You have a very bad headache. You have: Diarrhea that gets worse or does not go away. Blood in your poop (stool). This may cause poop to look black and tarry. No pee in 6-8 hours. Only a small amount of pee in 6-8 hours, and the pee is very dark. You have trouble breathing. These symptoms may be an emergency. Get help right away. Call 911. Do not wait to see if the symptoms will go away. Do not drive yourself to the hospital. This information is not intended to replace advice given to you by your health care provider. Make sure you discuss any questions you have with your health care provider. Document Revised: 08/31/2021 Document Reviewed: 08/31/2021 Elsevier Patient Education  2024 [employer]  Fluorouracil Injection What is this medication? FLUOROURACIL (flure oh YOOR a sil) treats some types of cancer. It works by slowing down the growth of cancer cells. This medicine may be used for other purposes; ask your health care provider or pharmacist if you have questions. COMMON BRAND NAME(S): Adrucil What should I tell my care team before I take this  medication? They need to know if you have any of these conditions: Blood disorders Dihydropyrimidine dehydrogenase (DPD) deficiency Infection, such as chickenpox, cold sores, herpes Kidney disease Liver disease Poor nutrition Recent or ongoing radiation therapy An unusual or allergic reaction to fluorouracil, other medications, foods, dyes, or preservatives If you or your partner are pregnant or trying to get pregnant Breast-feeding How should I use this medication? This medication is injected into a vein. It is administered by your care team in a hospital or clinic setting. Talk to your care team about the use of this medication in children. Special care may be needed. Overdosage: If you think you have taken too much of this medicine contact a poison control center or emergency room at once. NOTE: This medicine is only for you. Do not share this medicine with others. What if I miss a dose? Keep appointments for follow-up doses. It is important not to miss your dose. Call your care team if you are unable to keep an appointment. What may interact with this medication? Do not take this medication with any of the following: Live virus vaccines This  medication may also interact with the following: Medications that treat or prevent blood clots, such as warfarin, enoxaparin, dalteparin This list may not describe all possible interactions. Give your health care provider a list of all the medicines, herbs, non-prescription drugs, or dietary supplements you use. Also tell them if you smoke, drink alcohol, or use illegal drugs. Some items may interact with your medicine. What should I watch for while using this medication? Your condition will be monitored carefully while you are receiving this medication. This medication may make you feel generally unwell. This is not uncommon as chemotherapy can affect healthy cells as well as cancer cells. Report any side effects. Continue your course of treatment  even though you feel ill unless your care team tells you to stop. In some cases, you may be given additional medications to help with side effects. Follow all directions for their use. This medication may increase your risk of getting an infection. Call your care team for advice if you get a fever, chills, sore throat, or other symptoms of a cold or flu. Do not treat yourself. Try to avoid being around people who are sick. This medication may increase your risk to bruise or bleed. Call your care team if you notice any unusual bleeding. Be careful brushing or flossing your teeth or using a toothpick because you may get an infection or bleed more easily. If you have any dental work done, tell your dentist you are receiving this medication. Avoid taking medications that contain aspirin, acetaminophen, ibuprofen, naproxen, or ketoprofen unless instructed by your care team. These medications may hide a fever. Do not treat diarrhea with over the counter products. Contact your care team if you have diarrhea that lasts more than 2 days or if it is severe and watery. This medication can make you more sensitive to the sun. Keep out of the sun. If you cannot avoid being in the sun, wear protective clothing and sunscreen. Do not use sun lamps, tanning beds, or tanning booths. Talk to your care team if you or your partner wish to become pregnant or think you might be pregnant. This medication can cause serious birth defects if taken during pregnancy and for 3 months after the last dose. A reliable form of contraception is recommended while taking this medication and for 3 months after the last dose. Talk to your care team about effective forms of contraception. Do not father a child while taking this medication and for 3 months after the last dose. Use a condom while having sex during this time period. Do not breastfeed while taking this medication. This medication may cause infertility. Talk to your care team if you  are concerned about your fertility. What side effects may I notice from receiving this medication? Side effects that you should report to your care team as soon as possible: Allergic reactions--skin rash, itching, hives, swelling of the face, lips, tongue, or throat Heart attack--pain or tightness in the chest, shoulders, arms, or jaw, nausea, shortness of breath, cold or clammy skin, feeling faint or lightheaded Heart failure--shortness of breath, swelling of the ankles, feet, or hands, sudden weight gain, unusual weakness or fatigue Heart rhythm changes--fast or irregular heartbeat, dizziness, feeling faint or lightheaded, chest pain, trouble breathing High ammonia level--unusual weakness or fatigue, confusion, loss of appetite, nausea, vomiting, seizures Infection--fever, chills, cough, sore throat, wounds that don't heal, pain or trouble when passing urine, general feeling of discomfort or being unwell Low red blood cell level--unusual weakness or fatigue,  dizziness, headache, trouble breathing Pain, tingling, or numbness in the hands or feet, muscle weakness, change in vision, confusion or trouble speaking, loss of balance or coordination, trouble walking, seizures Redness, swelling, and blistering of the skin over hands and feet Severe or prolonged diarrhea Unusual bruising or bleeding Side effects that usually do not require medical attention (report to your care team if they continue or are bothersome): Dry skin Headache Increased tears Nausea Pain, redness, or swelling with sores inside the mouth or throat Sensitivity to light Vomiting This list may not describe all possible side effects. Call your doctor for medical advice about side effects. You may report side effects to FDA at 1-800-FDA-1088. Where should I keep my medication? This medication is given in a hospital or clinic. It will not be stored at home. NOTE: This sheet is a summary. It may not cover all possible information.  If you have questions about this medicine, talk to your doctor, pharmacist, or health care provider.  2024 Elsevier/Gold Standard (2021-06-09 00:00:00) The chemotherapy medication bag should finish at 46 hours, 96 hours, or 7 days. For example, if your pump is scheduled for 46 hours and it was put on at 4:00 p.m., it should finish at 2:00 p.m. the day it is scheduled to come off regardless of your appointment time.     Estimated time to finish at 1029.   If the display on your pump reads "Low Volume" and it is beeping, take the batteries out of the pump and come to the cancer center for it to be taken off.   If the pump alarms go off prior to the pump reading "Low Volume" then call [phone] and someone can assist you.  If the plunger comes out and the chemotherapy medication is leaking out, please use your home chemo spill kit to clean up the spill. Do NOT use paper towels or other household products.  If you have problems or questions regarding your pump, please call either [phone] (24 hours a day) or the cancer center Monday-Friday 8:00 a.m.- 4:30 p.m. at the clinic number and we will assist you. If you are unable to get assistance, then go to the nearest Emergency Department and ask the staff to contact the IV team for assistance.

## 2022-08-30 ENCOUNTER — Other Ambulatory Visit: Payer: Self-pay | Admitting: Oncology

## 2022-08-30 DIAGNOSIS — C168 Malignant neoplasm of overlapping sites of stomach: Secondary | ICD-10-CM | POA: Diagnosis not present

## 2022-08-30 DIAGNOSIS — C169 Malignant neoplasm of stomach, unspecified: Secondary | ICD-10-CM | POA: Diagnosis not present

## 2022-08-30 DIAGNOSIS — K760 Fatty (change of) liver, not elsewhere classified: Secondary | ICD-10-CM | POA: Diagnosis not present

## 2022-08-30 DIAGNOSIS — Z79899 Other long term (current) drug therapy: Secondary | ICD-10-CM | POA: Diagnosis not present

## 2022-08-30 DIAGNOSIS — I700 Atherosclerosis of aorta: Secondary | ICD-10-CM | POA: Diagnosis not present

## 2022-08-30 LAB — HEPATIC FUNCTION PANEL
ALT: 17 U/L (ref 7–35)
AST: 25 (ref 13–35)
Alkaline Phosphatase: 72 (ref 25–125)
Bilirubin, Total: 0.8

## 2022-08-30 LAB — BASIC METABOLIC PANEL
BUN: 26 — AB (ref 4–21)
CO2: 27 — AB (ref 13–22)
Chloride: 104 (ref 99–108)
Creatinine: 1.2 — AB (ref 0.5–1.1)
Glucose: 105
Potassium: 3.7 mEq/L (ref 3.5–5.1)
Sodium: 137 (ref 137–147)

## 2022-08-30 LAB — CBC AND DIFFERENTIAL
HCT: 38 (ref 36–46)
Hemoglobin: 13 (ref 12.0–16.0)
Neutrophils Absolute: 4.5
Platelets: 185 10*3/uL (ref 150–400)
WBC: 7.5

## 2022-08-30 LAB — CBC: RBC: 4.17 (ref 3.87–5.11)

## 2022-08-30 LAB — COMPREHENSIVE METABOLIC PANEL
Albumin: 4.3 (ref 3.5–5.0)
Calcium: 9.5 (ref 8.7–10.7)

## 2022-08-30 LAB — TSH: TSH: 1.46 (ref 0.41–5.90)

## 2022-09-01 LAB — T4, FREE: Free T4: 1.74

## 2022-09-01 LAB — T3, FREE: Free T3: 4.33

## 2022-09-02 ENCOUNTER — Inpatient Hospital Stay (INDEPENDENT_AMBULATORY_CARE_PROVIDER_SITE_OTHER): Payer: Medicare Other | Admitting: Oncology

## 2022-09-02 ENCOUNTER — Encounter: Payer: Self-pay | Admitting: Oncology

## 2022-09-02 VITALS — BP 139/63 | HR 99 | Temp 98.0°F | Resp 18 | Ht 60.0 in | Wt 203.5 lb

## 2022-09-02 DIAGNOSIS — C168 Malignant neoplasm of overlapping sites of stomach: Secondary | ICD-10-CM | POA: Diagnosis not present

## 2022-09-03 ENCOUNTER — Other Ambulatory Visit: Payer: Self-pay | Admitting: Pharmacist

## 2022-09-03 ENCOUNTER — Encounter: Payer: Self-pay | Admitting: Oncology

## 2022-09-03 DIAGNOSIS — C168 Malignant neoplasm of overlapping sites of stomach: Secondary | ICD-10-CM

## 2022-09-03 MED FILL — Dexamethasone Sodium Phosphate Inj 100 MG/10ML: INTRAMUSCULAR | Qty: 1 | Status: AC

## 2022-09-03 MED FILL — Fluorouracil IV Soln 5 GM/100ML (50 MG/ML): INTRAVENOUS | Qty: 77 | Status: AC

## 2022-09-03 MED FILL — Nivolumab IV Soln 240 MG/24ML: INTRAVENOUS | Qty: 24 | Status: AC

## 2022-09-03 MED FILL — Fluorouracil IV Soln 2.5 GM/50ML (50 MG/ML): INTRAVENOUS | Qty: 13 | Status: AC

## 2022-09-06 ENCOUNTER — Inpatient Hospital Stay: Payer: Medicare Other

## 2022-09-06 ENCOUNTER — Other Ambulatory Visit: Payer: Self-pay

## 2022-09-06 VITALS — BP 137/66 | HR 85 | Temp 97.7°F | Resp 20 | Ht 60.0 in | Wt 203.1 lb

## 2022-09-06 DIAGNOSIS — Z79899 Other long term (current) drug therapy: Secondary | ICD-10-CM | POA: Diagnosis not present

## 2022-09-06 DIAGNOSIS — Z5111 Encounter for antineoplastic chemotherapy: Secondary | ICD-10-CM | POA: Diagnosis not present

## 2022-09-06 DIAGNOSIS — C168 Malignant neoplasm of overlapping sites of stomach: Secondary | ICD-10-CM

## 2022-09-06 DIAGNOSIS — N12 Tubulo-interstitial nephritis, not specified as acute or chronic: Secondary | ICD-10-CM | POA: Diagnosis not present

## 2022-09-06 DIAGNOSIS — M25512 Pain in left shoulder: Secondary | ICD-10-CM | POA: Diagnosis not present

## 2022-09-06 DIAGNOSIS — C169 Malignant neoplasm of stomach, unspecified: Secondary | ICD-10-CM | POA: Diagnosis not present

## 2022-09-06 DIAGNOSIS — C786 Secondary malignant neoplasm of retroperitoneum and peritoneum: Secondary | ICD-10-CM | POA: Diagnosis not present

## 2022-09-06 MED ORDER — DEXTROSE 5 % IV SOLN: Freq: Once | INTRAVENOUS | Status: AC

## 2022-09-06 MED ORDER — SODIUM CHLORIDE 0.9 % IV SOLN
10.0000 mg | Freq: Once | INTRAVENOUS | Status: AC
  Administered 2022-09-06: 10 mg via INTRAVENOUS
  Filled 2022-09-06: qty 10

## 2022-09-06 MED ORDER — PALONOSETRON HCL INJECTION 0.25 MG/5ML
0.2500 mg | Freq: Once | INTRAVENOUS | Status: AC
  Administered 2022-09-06: 0.25 mg via INTRAVENOUS
  Filled 2022-09-06: qty 5

## 2022-09-06 MED ORDER — SODIUM CHLORIDE 0.9 % IV SOLN
240.0000 mg | Freq: Once | INTRAVENOUS | Status: AC
  Administered 2022-09-06: 240 mg via INTRAVENOUS
  Filled 2022-09-06: qty 24

## 2022-09-06 MED ORDER — LEUCOVORIN CALCIUM INJECTION 350 MG
320.0000 mg/m2 | Freq: Once | INTRAVENOUS | Status: AC
  Administered 2022-09-06: 630 mg via INTRAVENOUS
  Filled 2022-09-06: qty 31.5

## 2022-09-06 MED ORDER — SODIUM CHLORIDE 0.9 % IV SOLN: Freq: Once | INTRAVENOUS | Status: AC

## 2022-09-06 MED ORDER — FLUOROURACIL CHEMO INJECTION 2.5 GM/50ML
320.0000 mg/m2 | Freq: Once | INTRAVENOUS | Status: AC
  Administered 2022-09-06: 650 mg via INTRAVENOUS
  Filled 2022-09-06: qty 13

## 2022-09-06 MED ORDER — SODIUM CHLORIDE 0.9 % IV SOLN
1960.0000 mg/m2 | INTRAVENOUS | Status: DC
  Administered 2022-09-06: 3850 mg via INTRAVENOUS
  Filled 2022-09-06: qty 77

## 2022-09-06 NOTE — Patient Instructions (Signed)
[facility] [place]  Discharge Instructions: Thank you for choosing [facility] to provide your oncology and hematology care.  If you have a lab appointment with the Cancer Center, please go directly to the Cancer Center and check in at the registration area.   Wear comfortable clothing and clothing appropriate for easy access to any Portacath or PICC line.   We strive to give you quality time with your provider. You may need to reschedule your appointment if you arrive late (15 or more minutes).  Arriving late affects you and other patients whose appointments are after yours.  Also, if you miss three or more appointments without notifying the office, you may be dismissed from the clinic at the provider's discretion.      For prescription refill requests, have your pharmacy contact our office and allow 72 hours for refills to be completed.    Today you received the following chemotherapy and/or immunotherapy agents: Opdivo Leucovorin, 5fu      To help prevent nausea and vomiting after your treatment, we encourage you to take your nausea medication as directed.  BELOW ARE SYMPTOMS THAT SHOULD BE REPORTED IMMEDIATELY: *FEVER GREATER THAN 100.4 F (38 C) OR HIGHER *CHILLS OR SWEATING *NAUSEA AND VOMITING THAT IS NOT CONTROLLED WITH YOUR NAUSEA MEDICATION *UNUSUAL SHORTNESS OF BREATH *UNUSUAL BRUISING OR BLEEDING *URINARY PROBLEMS (pain or burning when urinating, or frequent urination) *BOWEL PROBLEMS (unusual diarrhea, constipation, pain near the anus) TENDERNESS IN MOUTH AND THROAT WITH OR WITHOUT PRESENCE OF ULCERS (sore throat, sores in mouth, or a toothache) UNUSUAL RASH, SWELLING OR PAIN  UNUSUAL VAGINAL DISCHARGE OR ITCHING   Items with * indicate a potential emergency and should be followed up as soon as possible or go to the Emergency Department if any problems should occur.  Please show the CHEMOTHERAPY ALERT CARD or IMMUNOTHERAPY ALERT CARD at  check-in to the Emergency Department and triage nurse.  Should you have questions after your visit or need to cancel or reschedule your appointment, please contact [facility] [place]  Dept: [phone]  and follow the prompts.  Office hours are 8:00 a.m. to 4:30 p.m. Monday - Friday. Please note that voicemails left after 4:00 p.m. may not be returned until the following business day.  We are closed weekends and major holidays. You have access to a nurse at all times for urgent questions. Please call the main number to the clinic Dept: [phone] and follow the prompts.  For any non-urgent questions, you may also contact your provider using MyChart. We now offer e-Visits for anyone 18 and older to request care online for non-urgent symptoms. For details visit [email or web address].   Also download the MyChart app! Go to the app store, search "MyChart", open the app, select [place], and log in with your MyChart username and password.  

## 2022-09-08 ENCOUNTER — Encounter: Payer: Self-pay | Admitting: Oncology

## 2022-09-08 ENCOUNTER — Inpatient Hospital Stay: Payer: Medicare Other

## 2022-09-08 VITALS — BP 124/56 | HR 82 | Temp 98.6°F | Resp 18 | Ht 60.0 in | Wt 203.0 lb

## 2022-09-08 DIAGNOSIS — C786 Secondary malignant neoplasm of retroperitoneum and peritoneum: Secondary | ICD-10-CM | POA: Diagnosis not present

## 2022-09-08 DIAGNOSIS — C169 Malignant neoplasm of stomach, unspecified: Secondary | ICD-10-CM | POA: Diagnosis not present

## 2022-09-08 DIAGNOSIS — N12 Tubulo-interstitial nephritis, not specified as acute or chronic: Secondary | ICD-10-CM | POA: Diagnosis not present

## 2022-09-08 DIAGNOSIS — Z5111 Encounter for antineoplastic chemotherapy: Secondary | ICD-10-CM | POA: Diagnosis not present

## 2022-09-08 DIAGNOSIS — M25512 Pain in left shoulder: Secondary | ICD-10-CM | POA: Diagnosis not present

## 2022-09-08 DIAGNOSIS — Z79899 Other long term (current) drug therapy: Secondary | ICD-10-CM | POA: Diagnosis not present

## 2022-09-08 DIAGNOSIS — C168 Malignant neoplasm of overlapping sites of stomach: Secondary | ICD-10-CM

## 2022-09-08 MED ORDER — HEPARIN SOD (PORK) LOCK FLUSH 100 UNIT/ML IV SOLN
500.0000 [IU] | Freq: Once | INTRAVENOUS | Status: AC | PRN
  Administered 2022-09-08: 500 [IU]

## 2022-09-08 MED ORDER — SODIUM CHLORIDE 0.9 % IV SOLN: Freq: Once | INTRAVENOUS | Status: AC

## 2022-09-08 MED ORDER — SODIUM CHLORIDE 0.9% FLUSH
10.0000 mL | INTRAVENOUS | Status: DC | PRN
  Administered 2022-09-08: 10 mL

## 2022-09-08 NOTE — Patient Instructions (Signed)
Managing Chemotherapy Side Effects, Adult Chemotherapy is a treatment that uses medicine to kill cancer cells. However, in addition to killing cancer cells, the medicines can also damage healthy cells. The damage to healthy cells can lead to side effects. The exact side effects depend on the specific medicines used. Most of the side effects of chemotherapy go away once treatment is finished. Until then, work closely with your health care providers and take an active role in managing your side effects. What are common side effects of chemotherapy? Increased risk of infection, bruising, or bleeding. Nausea and vomiting. Constipation or diarrhea. Loss of appetite. Hair loss. Mouth or throat sores. Tiredness (fatigue). Tingling, pain, or numbness in the hands and feet. Dry, sensitive, itchy, or sore skin. Sleep disturbances, such as excessive sleepiness. Confusion, anxiety, or mood swings. Memory changes. How to manage the side effects of chemotherapy Medicines Take over-the-counter and prescription medicines only as told by your health care provider. Talk with your health care provider before taking vitamins, herbs, supplements, or over-the-counter medicines. Some of these can interfere with chemotherapy. Activity Get plenty of rest. Get regular exercise by doing activities such as walking, gentle yoga, or tai chi. Return to your normal activities as told by your health care provider. Ask your health care provider what activities are safe for you. Eating and drinking  Talk to a dietitian about what you should eat and drink during cancer treatment. Drink enough fluid to keep your urine pale yellow. If you have side effects that affect eating, these tips may help: Eat smaller meals and snacks often. Drink high-nutrition and high-calorie shakes or supplements. Choose bland and soft foods that are easy to eat. Do not eat foods that are hot, spicy, or hard to swallow. Do not eat raw or  undercooked meat, eggs, or seafood. Always wash fresh fruits and vegetables well before eating them. Skin care If you have sore or itchy skin: Wear soft, comfortable clothing. Apply creams and ointments to your skin as told by your health care provider. If you lose your hair, consider wearing a wig, hat, or scarf to cover your head. You may want to have someone shave your head as you start to lose hair. During outdoor activities, protect your head and skin from the sun by using sunscreen with an SPF of 30 or higher or by wearing protective clothing and a hat. Meet with a hair and skin care specialist for makeup and skin care tips. Apply sunscreen to your scalp as told by your health care provider. General tips Learn as much as you can about your condition. If you are struggling emotionally, talk with a mental health care provider or join a support group. Keep all follow-up visits. This is important. How to prevent infection and bleeding Chemotherapy may lower your blood counts and put you at risk for infection and bleeding. Here are some ways to help prevent problems. Vaccines Talk to your health care provider about vaccines. You should not get any live vaccines, such as the polio, MMR, chickenpox, and shingles vaccines until your health care provider says that it is safe to do so. Do not be around people who have had live vaccines for as long as your health care provider recommends. Make sure you get a yearly flu shot. People who will be near you should also get a yearly flu shot. Social activity Stay away from crowded places where you could be exposed to germs. Do not be around people who may be sick or   people who have symptoms of a fever until they have been fever-free for at least 24 hours. Do not share food, cups, straws, or utensils with other people. Wear a mask when outside the home if your blood counts are low. Cleanliness  Wash your hands often for at least 20 seconds. Also make  sure that other members of your household wash their hands often. Brush your teeth twice daily using a soft toothbrush. Use mouth rinse only as told by your health care provider. Take a bath or shower daily unless your health care provider gives different instructions. General tips Take your temperature regularly, especially if you have chills or feel warm. Check with your health care provider: Before you travel. Before you have a dental procedure. Before you use a swimming pool, hot tub, or swim in a lake or ocean. If you get chemotherapy through an IV or port, check the site every day for signs of infection. Check for redness, swelling, pain, fluid, and warmth. Avoid activities that put you at risk for injury or bleeding. Use an electric razor to shave instead of a blade. Questions to ask your health care provider What are the most common side effects of my treatment? How will they affect my daily life? What can I do to manage them? What are some possible long-term side effects? What are possible complications? What support services are available? What number can I call with questions or concerns? Where to find support Cancer affects the entire family. Find out what family support resources are available from your cancer treatment center. For more support, turn to: Your cancer care team. Friends and family. Your religious community. Other people with cancer. Community-based or online support groups. Where to find more information National Cancer Institute: www.cancer.gov American Cancer Society: www.cancer.org Contact a health care provider if: You bleed or bruise more often. You notice blood in your urine or stool. You have any of these symptoms: A skin rash, or dry or itchy skin. A headache or stiff neck. Cold or flu symptoms. A cough. Persistent nausea or vomiting. Persistent diarrhea. Frequent urination, burning when passing urine, or foul-smelling urine. You cannot eat  because of mouth or throat pain. You are sad, confused, anxious, or depressed. Get help right away if: You have any of these symptoms: A fever or chills. Your health care provider should know about this right away. Redness, swelling, pain, fluid, or warmth near an IV site. Bleeding that you cannot stop. A seizure. You cannot swallow. You have chest pain. You have trouble breathing. A family member or caregiver should get help right away if you have a sudden or unusual change in behavior. These symptoms may be an emergency. Get help right away. Call 911. Do not wait to see if the symptoms will go away. Do not drive yourself to the hospital. Summary Chemotherapy is a treatment that uses medicine to kill cancer cells and can cause side effects. The specific side effects depend on the specific medicines used. Learn as much as you can about your condition. Ask about side effects to watch for and how to treat them. Seek out support and resources from others. Find out what family support resources are available from your cancer treatment center. Let your health care provider know if you notice any new, unusual, or worsening symptoms, especially fever or chills. This information is not intended to replace advice given to you by your health care provider. Make sure you discuss any questions you have with your health   care provider. Document Revised: 01/22/2021 Document Reviewed: 01/22/2021 Elsevier Patient Education  2024 [employer]  

## 2022-09-13 DIAGNOSIS — H40013 Open angle with borderline findings, low risk, bilateral: Secondary | ICD-10-CM | POA: Diagnosis not present

## 2022-09-13 DIAGNOSIS — Z961 Presence of intraocular lens: Secondary | ICD-10-CM | POA: Diagnosis not present

## 2022-09-14 ENCOUNTER — Encounter: Payer: Self-pay | Admitting: Oncology

## 2022-09-14 ENCOUNTER — Encounter: Payer: Self-pay | Admitting: Hematology and Oncology

## 2022-09-16 ENCOUNTER — Other Ambulatory Visit: Payer: Self-pay | Admitting: Oncology

## 2022-09-16 ENCOUNTER — Encounter: Payer: Self-pay | Admitting: Oncology

## 2022-09-16 ENCOUNTER — Inpatient Hospital Stay (INDEPENDENT_AMBULATORY_CARE_PROVIDER_SITE_OTHER): Payer: Medicare Other | Admitting: Oncology

## 2022-09-16 ENCOUNTER — Telehealth: Payer: Self-pay

## 2022-09-16 ENCOUNTER — Inpatient Hospital Stay: Payer: Medicare Other | Attending: Hematology and Oncology

## 2022-09-16 VITALS — BP 140/64 | HR 102 | Temp 97.8°F | Resp 16 | Ht 60.0 in | Wt 203.0 lb

## 2022-09-16 DIAGNOSIS — Z1589 Genetic susceptibility to other disease: Secondary | ICD-10-CM

## 2022-09-16 DIAGNOSIS — J0100 Acute maxillary sinusitis, unspecified: Secondary | ICD-10-CM

## 2022-09-16 DIAGNOSIS — R21 Rash and other nonspecific skin eruption: Secondary | ICD-10-CM | POA: Insufficient documentation

## 2022-09-16 DIAGNOSIS — G629 Polyneuropathy, unspecified: Secondary | ICD-10-CM | POA: Insufficient documentation

## 2022-09-16 DIAGNOSIS — Z1502 Genetic susceptibility to malignant neoplasm of ovary: Secondary | ICD-10-CM | POA: Diagnosis not present

## 2022-09-16 DIAGNOSIS — Z79899 Other long term (current) drug therapy: Secondary | ICD-10-CM | POA: Insufficient documentation

## 2022-09-16 DIAGNOSIS — Z1509 Genetic susceptibility to other malignant neoplasm: Secondary | ICD-10-CM

## 2022-09-16 DIAGNOSIS — D649 Anemia, unspecified: Secondary | ICD-10-CM | POA: Diagnosis not present

## 2022-09-16 DIAGNOSIS — C168 Malignant neoplasm of overlapping sites of stomach: Secondary | ICD-10-CM

## 2022-09-16 DIAGNOSIS — Z5111 Encounter for antineoplastic chemotherapy: Secondary | ICD-10-CM | POA: Diagnosis not present

## 2022-09-16 DIAGNOSIS — C169 Malignant neoplasm of stomach, unspecified: Secondary | ICD-10-CM | POA: Insufficient documentation

## 2022-09-16 DIAGNOSIS — J470 Bronchiectasis with acute lower respiratory infection: Secondary | ICD-10-CM | POA: Insufficient documentation

## 2022-09-16 DIAGNOSIS — Z1501 Genetic susceptibility to malignant neoplasm of breast: Secondary | ICD-10-CM | POA: Diagnosis not present

## 2022-09-16 DIAGNOSIS — G609 Hereditary and idiopathic neuropathy, unspecified: Secondary | ICD-10-CM | POA: Diagnosis not present

## 2022-09-16 LAB — COMPREHENSIVE METABOLIC PANEL
Albumin: 4.4 (ref 3.5–5.0)
Calcium: 9.9 (ref 8.7–10.7)

## 2022-09-16 LAB — CBC AND DIFFERENTIAL
HCT: 35 — AB (ref 36–46)
Hemoglobin: 12.4 (ref 12.0–16.0)
Neutrophils Absolute: 3.92
Platelets: 223 10*3/uL (ref 150–400)
WBC: 7.4

## 2022-09-16 LAB — HEPATIC FUNCTION PANEL
ALT: 15 U/L (ref 7–35)
AST: 26 (ref 13–35)
Alkaline Phosphatase: 83 (ref 25–125)
Bilirubin, Total: 1

## 2022-09-16 LAB — BASIC METABOLIC PANEL
BUN: 20 (ref 4–21)
CO2: 19 (ref 13–22)
Chloride: 105 (ref 99–108)
Creatinine: 1.2 — AB (ref 0.5–1.1)
Glucose: 161
Potassium: 3.4 mEq/L — AB (ref 3.5–5.1)
Sodium: 138 (ref 137–147)

## 2022-09-16 LAB — CBC: RBC: 3.9 (ref 3.87–5.11)

## 2022-09-16 LAB — TSH: TSH: 1.869 u[IU]/mL (ref 0.350–4.500)

## 2022-09-16 LAB — COMPREHENSIVE METABOLIC PANEL (ASHBORO CC SCANNED REPORT)

## 2022-09-16 LAB — CBC W DIFFERENTIAL (~~LOC~~ CC SCANNED REPORT)

## 2022-09-16 MED ORDER — AMOXICILLIN-POT CLAVULANATE 875-125 MG PO TABS
1.0000 | ORAL_TABLET | Freq: Two times a day (BID) | ORAL | 0 refills | Status: AC
Start: 2022-09-16 — End: 2022-09-26

## 2022-09-16 NOTE — Telephone Encounter (Signed)
Dr Gilman Buttner ask that I call pt and ask her to take a COVID test. Pt is scheduled for IVF on Monday. We need to make sure she is negative before coming to infusion center next week. Pt verbalized understanding. She will test and call me back tomorrow.

## 2022-09-16 NOTE — Progress Notes (Signed)
[facility]  [address] [place],  [place]  [address] [phone]  Clinic Day: 09/16/22  Referring physician: Dellia Beckwith, MD  ASSESSMENT & PLAN:  Gastric adenocarcinoma This is a poorly differentiated adenocarcinoma with signet ring features and ulceration. Stain for HER2 was negative at 0. It is metastatic to the omentum and so that would stage this as a T4 N0 M1, stage IVB.  She was on treatment with chemotherapy with FOLFOX and nivolumab for 10 cycles with very good response, but then had increasing dermatologic toxicities and neuropathy. Her PET scan from 03/22/2022 shows a new area of hypermetabolic activity in the right lower lobe of peripheral consolidation, which is likely atelectasis or resolving pneumonia, measuring 3 cm. The thickening of her stomach has decreased with decreased metabolic activity and there is shrinkage of the omental metastasis from 22 mm to 13 mm. The last time she received chemotherapy alone she had severe rash and fever and was admitted to the hospital, but she was also found to have pneumonia and a UTI at that time. We are not sure which medication caused her rashes. Dr. Jennye Boroughs did a EGD on March, 5th and found persistent poorly differentiated adenocarcinoma and a 3 cm mass of the distal body of the stomach. Since her current treatment is palliating her disease, we will continue. She also had a CT chest, abdomen, and pelvis on 08/30/2022 that revealed mild persistent gastric wall thickening with subtle adjacent omental nodularity, consistent with treated gastric malignancy,   Immune Mediated Nephritis Her creatinine has improved with normal saline IV and she was encouraged to drink more fluids. Renal ultrasound was normal. We felt this was caused by her immunotherapy and placed her on Prednisone for a short course at low dose, which was effective. I tapered her off completely as of the end of April. Her creatinine  went up again to 1.27 and then 1.5 two weeks ago. I therefore gave her IV fluids and rechecked last week and it was down to 1.2 and remains stable today at 1.2. We will continue IV fluids with her chemotherapy infusions.    Acute bronchitis She has cough productive of yellow brown sputum and is feeling poorly.  I will place her on Augmentin and hold her treatment this cycle.   Plan She cannot tolerate doxycycline so I will prescribe Augmentin twice a day 875 mg for 10 days. Her WBC is 7.4, hemoglobin is 12.4, and platelet count is 223,000 as of today. Her CMP is nearly normal other than an elevated BUN of 20 with a creatinine of 1.20 and a low potassium of 3.4. She was taking 3 oral potassium supplements and was switched to taking it once a day. I now instructed her to take the oral supplement twice daily. Her day 1 cycle 21 of FOLFOX is scheduled on 09/20/2022 and her day 3 pump D/C, cycle 21 is scheduled on 09/22/2022. We will hold off on this treatment and let her rest and build her strength up for now. She will receive IV fluids on 09/20/2022 and 09/22/2022 instead. [name] back in 2 weeks with CBC, CMP, TSH, and T4. I reviewed all of this with her and her daughter and answered their questions. She understands and agrees with this plan.    I provided 16 minutes of face-to-face time during this this encounter and > 50% was spent counseling as documented under my assessment and plan.   Dellia Beckwith, MD [place]  [facility] AT [facility] [address] [place] [address] Dept: [phone] Dept Fax: [phone]     CHIEF COMPLAINT:  CC: Gastric cancer  Current Treatment: Chemotherapy/immunotherapy  HISTORY OF PRESENT ILLNESS:  Mackenzie Lewis is a 87 y.o. female with a history of gastric cancer who is referred in consultation with Dr. Shara Blazing for assessment and management.  She had noticed that she was having regurgitation  when eating and had lost over 30 pounds.  An ultrasound was done, revealing hepatic steatosis, and led to an MRI scan on June 5 which revealed gastric wall thickening with confluent nodularity of the omentum anterior to the stomach measuring 3.5 cm consistent with metastatic tumor.  She also had low-grade edema and wall thickening extending into the duodenum from the stomach.  She was referred to Dr. Shara Blazing and he did an EGD on July 7.  This revealed a large ulceration measuring 1.2 cm along the greater curvature.  She also had diffusely edematous and erythematous wall with erosions of the antrum and stiff and friable mucosa with oozing of blood.  These findings extended to the gastric fundus as well.  Pathology revealed a poorly differentiated adenocarcinoma with signet ring features from the biopsies of the ulcer as well as the antrum and the fundus of the stomach.  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  She was placed on omeprazole.  Her test for H. pylori was negative.  She was referred to Dr. Hardin Negus for consideration of surgery but he felt this was not resectable because of the extensive involvement and I agree.  I would consider this extending to the duodenum and also metastatic to the omentum.  PET scan confirmed these findings and she wished to pursue systemic intravenous therapy.  She continues to eat and has adjusted her diet to softer foods and liquids and is drinking boost so she has maintained her weight.  She does have some Zofran ODT for nausea when needed.  She has had some anorexia, nausea, occasional vomiting, early satiety, dysphagia, and belching.  A CEA and CA 19-9 were normal. She has been started on FOLFOX chemotherapy along with immunotherapy.  I have reviewed her chart and materials related to her cancer extensively and collaborated history with the patient. Summary of oncologic history is as follows: Oncology History   Gastric cancer (HCC)  09/10/2021 Initial Diagnosis   Gastric cancer (HCC)   09/10/2021 Cancer Staging   Staging form: Stomach, AJCC 8th Edition - Clinical stage from 09/10/2021: Stage IVB (cT4b, cN0, cM1) - Signed by Dellia Beckwith, MD on 09/10/2021 Histopathologic type: Adenocarcinoma, NOS Stage prefix: Initial diagnosis Total positive nodes: 0 Histologic grade (G): G3 Histologic grading system: 3 grade system Sites of metastasis: Peritoneal surface Diagnostic confirmation: Positive histology PLUS positive immunophenotyping and/or positive genetic studies Specimen type: Endoscopy with Biopsy Staged by: Managing physician Carcinoembryonic antigen (CEA) (ng/mL): 2.8 Carbohydrate antigen 19-9 (CA 19-9) (U/mL): 4.9 HER2 status: Unknown Microsatellite instability (MSI): Unknown Tumor location in stomach: Other Clinical staging modalities: Biopsy, Endoscopy Stage used in treatment planning: Yes National guidelines used in treatment planning: Yes Type of national guideline used in treatment planning: NCCN   09/28/2021 - 10/14/2021 Chemotherapy   Patient is on Treatment Plan : GASTROESOPHAGEAL FOLFOX + Nivolumab q14d     09/28/2021 -  Chemotherapy   Patient is on Treatment Plan : GASTROESOPHAGEAL FOLFOX + Nivolumab q14d     12/09/2021  Genetic Testing   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  Report date is 12/09/2021.   The Multi-Cancer + RNA Panel offered by [facility] includes sequencing and/or deletion/duplication analysis of the following 84 genes:  AIP*, ALK, APC*, ATM*, AXIN2*, BAP1*, BARD1*, BLM*, BMPR1A*, BRCA1*, BRCA2*, BRIP1*, CASR, CDC73*, CDH1*, CDK4, CDKN1B*, CDKN1C*, CDKN2A, CEBPA, CHEK2*, CTNNA1*, DICER1*, DIS3L2*, EGFR, EPCAM, FH*, FLCN*, GATA2*, GPC3, GREM1, HOXB13, HRAS, KIT, MAX*, MEN1*, MET, MITF, MLH1*, MSH2*, MSH3*, MSH6*, MUTYH*, NBN*, NF1*, NF2*, NTHL1*, PALB2*, PDGFRA, PHOX2B, PMS2*, POLD1*, POLE*, POT1*, PRKAR1A*,  PTCH1*, PTEN*, RAD50*, RAD51C*, RAD51D*, RB1*, RECQL4, RET, RUNX1*, SDHA*, SDHAF2*, SDHB*, SDHC*, SDHD*, SMAD4*, SMARCA4*, SMARCB1*, SMARCE1*, STK11*, SUFU*, TERC, TERT, TMEM127*, Tp53*, TSC1*, TSC2*, VHL*, WRN*, and WT1.  RNA analysis is performed for * genes.     INTERVAL HISTORY:  Mackenzie Lewis is here today for a follow-up for her gastric cancer, she continues maintenance treatment with 5FU, Leucovorin, and Nivolumab. Patient states that she doesn't feel well and complains of a constant fever accompanied with a cough with yellowish brownish phlegm, fatigue, and chills. She states that "this week has been rough" and she feels very sluggish and inactive. She notes that tylenol does not alleviate her symptoms like it usually does. She cannot tolerate doxycycline so I will prescribe Augmentin twice a day 875 mg for 10 days. Her WBC is 7.4, hemoglobin is 12.4, and platelet count is 223,000 as of today. Her CMP is nearly normal other than an elevated BUN of 20 with a creatinine of 1.20 and a low potassium of 3.4. She was taking 3 oral potassium supplements and was switched to taking it once a day. I now instructed her to take the oral supplement twice daily. Her day 1 cycle 21 of FOLFOX is scheduled on 09/20/2022 and her day 3 pump D/C, cycle 21 is scheduled on 09/22/2022. We will hold off on this treatment and let her rest and build her strength up for now. She will receive IV fluids on 09/20/2022 and 09/22/2022 instead. [name] back in 2 weeks with CBC, CMP, TSH, and T4.  She denies signs of infection such as sore throat, sinus drainage, cough, or urinary symptoms.  She denies fevers or recurrent chills. She denies pain. She denies nausea, vomiting, chest pain, dyspnea or cough. Her appetite is poor this week and her weight has been stable. She is accompanied at today's visit with her daughter.   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  If that happens, she is to call us so that we can schedule her for an ultrasound to look for synovitis.     Essential hypertension 09/09/2016   Gastric cancer (HCC) 09/10/2021   Hyperlipidemia 09/01/2016   Hypertension    Hypothyroidism 09/01/2016   Osteoarthritis of both feet 01/20/2016   Osteoarthritis, hand 01/20/2016   Thyroid disease   Degenerative disc disease History of endometriosis  Past Surgical History:  Procedure Laterality Date   APPENDECTOMY  LAPAROSCOPIC APPENDECTOMY N/A 04/10/2016   Procedure: APPENDECTOMY LAPAROSCOPIC;  Surgeon: Abigail Miyamoto, MD;  Location: [facility] OR;  Service: General;  Laterality: N/A;  Bilateral tubal ligation Total hysterectomy and bilateral salpingo-oophorectomy in 1980  Family History  Problem Relation Age of Onset   Hypertension Mother    Prostate cancer Father        metastatic; d. 9   Brain cancer Sister 80   Breast cancer Sister 77   AAA (abdominal aortic aneurysm) Brother    Leukemia Cousin        x2 maternal female cousins; d. before 49   Breast cancer Daughter 65       DCIS  Her sister has had breast cancer as well as a tumor of her head Her daughter has had breast cancer last year  Social History:  reports that she has never smoked. She has never used smokeless tobacco. She reports that she does not currently use alcohol. She reports that she does not use drugs.The patient is accompanied by her son, daughter-in-law and daughter today.  She is single but lives with a  significant other.  She has the 2 children.  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  She grew up in [place] and [place].  She is active and healthy, especially for her age.  Allergies:  Allergies  Allergen Reactions   Doxycycline Rash   Sulfa Antibiotics Rash and Other (See Comments)    Other reaction(s): Other (See Comments)  "Made me feel weird"    Current Medications: Current Outpatient Medications  Medication Sig Dispense Refill   aspirin 81 MG EC tablet Take 81 mg by mouth daily. Swallow whole.     b complex vitamins capsule Take 1 capsule by mouth daily.     Calcium Carbonate (CALCIUM 600 PO) Take 1 tablet by mouth daily.     Cholecalciferol (VITAMIN D3) 5000 units CAPS Take 1 capsule by mouth daily.     EXFORGE HCT 5-160-12.5 MG TABS Take 1 tablet by mouth daily.     famotidine (PEPCID) 40 MG tablet Take 40 mg by mouth at bedtime.     KRILL OIL PO Take 1 capsule by mouth daily. Unknown strenght     Lactobacillus TABS Take 1 tablet by mouth 2 (two) times daily.     levothyroxine (SYNTHROID, LEVOTHROID) 75 MCG tablet Take 75 mcg by mouth daily before breakfast.      magic mouthwash SOLN SWISH AND SWALLOW BY MOUTH EVERY THREE TO FOUR HOURS     NON FORMULARY MMW: 3 parts Maalox 2 parts Benadryl 1 part viscious lidicaine  Disp. 6oz  Instructions: 5ml swish and swallow every 3-4 hours     omeprazole (PRILOSEC) 40 MG capsule Take 1 capsule (40 mg total) by mouth 2 (two) times daily. 60 capsule 5   ondansetron (ZOFRAN-ODT) 4 MG disintegrating tablet Take 1 tablet (4 mg total) by mouth every 8 (eight) hours as needed for nausea or vomiting. 90 tablet 0   polyethylene glycol powder (GLYCOLAX/MIRALAX) 17 GM/SCOOP powder SMARTSIG:1 scoopful By Mouth Daily     potassium chloride SA (KLOR-CON M) 20 MEQ tablet TAKE ONE TABLET BY MOUTH THREE TIMES DAILY 90 tablet 5   pravastatin (PRAVACHOL) 20 MG tablet Take 1 tablet (20 mg total) by mouth every evening. 90  tablet 3   Probiotic Product (PROBIOTIC DAILY PO) Take 1 tablet by mouth daily.     prochlorperazine (COMPAZINE) 10 MG tablet Take 1 tablet (10 mg total)  by mouth every 6 (six) hours as needed for nausea or vomiting. 90 tablet 3   triamcinolone 0.1% oint-Eucerin equivalent cream 1:1 mixture Apply topically 2 (two) times daily. 160 g 0   triamcinolone cream (KENALOG) 0.1 % Apply to affected skin twice daily (Patient taking differently: daily as needed. Apply to affected skin twice daily) 160 g 0   No current facility-administered medications for this visit.   REVIEW OF SYSTEMS:  Review of Systems  Constitutional:  Positive for chills, fatigue and fever. Negative for appetite change, diaphoresis and unexpected weight change.  HENT:   Negative for hearing loss, lump/mass, mouth sores, nosebleeds, sore throat, tinnitus, trouble swallowing and voice change.        Sinus congestion  Eyes:  Negative for eye problems and icterus.  Respiratory:  Positive for cough (with phlegm). Negative for chest tightness, hemoptysis, shortness of breath and wheezing.   Cardiovascular: Negative.  Negative for chest pain, leg swelling and palpitations.  Gastrointestinal: Negative.  Negative for abdominal distention, abdominal pain, blood in stool, constipation, diarrhea, nausea, rectal pain and vomiting.  Endocrine: Negative.  Negative for hot flashes.  Genitourinary: Negative.  Negative for bladder incontinence, difficulty urinating, dyspareunia, dysuria, frequency, hematuria, menstrual problem, nocturia, pelvic pain, vaginal bleeding and vaginal discharge.   Musculoskeletal: Negative.  Negative for arthralgias, back pain, flank pain, gait problem, myalgias, neck pain and neck stiffness.       Left shoulder pain in the scapula area. (Constant and burning)  Skin: Negative.  Negative for itching, rash and wound.       Occasional blotchy discoloration of her lower extremities, but much improved  Neurological:  Positive  for numbness (in her fingers and toes). Negative for dizziness, extremity weakness, gait problem, headaches, light-headedness, seizures and speech difficulty.  Hematological:  Negative for adenopathy. Bruises/bleeds easily.  Psychiatric/Behavioral: Negative.  Negative for confusion, decreased concentration, depression, sleep disturbance and suicidal ideas. The patient is not nervous/anxious.    VITALS:  Blood pressure (!) 140/64, pulse (!) 102, temperature 97.8 F (36.6 C), temperature source Oral, resp. rate 16, height 5' (1.524 m), weight 203 lb (92.1 kg), SpO2 98%.  Wt Readings from Last 3 Encounters:  09/22/22 196 lb 12 oz (89.2 kg)  09/20/22 202 lb 0.6 oz (91.6 kg)  09/16/22 203 lb (92.1 kg)    Body mass index is 39.65 kg/m.  Performance status (ECOG): 1 - Symptomatic but completely ambulatory  PHYSICAL EXAM:  Physical Exam Vitals and nursing note reviewed. Exam conducted with a chaperone present.  Constitutional:      General: She is not in acute distress.    Appearance: Normal appearance. She is normal weight. She is not ill-appearing, toxic-appearing or diaphoretic.  HENT:     Head: Normocephalic and atraumatic.     Right Ear: Tympanic membrane, ear canal and external ear normal. There is no impacted cerumen.     Left Ear: Tympanic membrane, ear canal and external ear normal. There is no impacted cerumen.     Nose: Nose normal. No congestion or rhinorrhea.     Mouth/Throat:     Mouth: Mucous membranes are moist.     Pharynx: Oropharynx is clear. Posterior oropharyngeal erythema present. No oropharyngeal exudate.     Comments: Mild erythema Eyes:     General: No scleral icterus.       Right eye: No discharge.        Left eye: No discharge.     Extraocular Movements: Extraocular movements intact.  Conjunctiva/sclera: Conjunctivae normal.     Pupils: Pupils are equal, round, and reactive to light.  Neck:     Vascular: No carotid bruit.  Cardiovascular:     Rate and  Rhythm: Normal rate and regular rhythm.     Pulses: Normal pulses.     Heart sounds: Normal heart sounds. No murmur heard.    No friction rub. No gallop.  Pulmonary:     Effort: Pulmonary effort is normal. No respiratory distress.     Breath sounds: Normal breath sounds. No stridor. No wheezing, rhonchi or rales.  Chest:     Chest wall: No tenderness.  Abdominal:     General: Bowel sounds are normal. There is no distension.     Palpations: Abdomen is soft. There is no hepatomegaly, splenomegaly or mass.     Tenderness: There is no abdominal tenderness. There is no right CVA tenderness, left CVA tenderness, guarding or rebound.     Hernia: No hernia is present.     Comments: Slight firmness in the epigastrium with deep palpation.   Musculoskeletal:        General: No swelling, tenderness, deformity or signs of injury.     Left shoulder: Normal range of motion.     Cervical back: Normal range of motion and neck supple. No rigidity or tenderness.     Right lower leg: 1+ Edema present.     Left lower leg: 1+ Edema present.  Lymphadenopathy:     Cervical: No cervical adenopathy.     Right cervical: No superficial, deep or posterior cervical adenopathy.    Left cervical: No superficial, deep or posterior cervical adenopathy.     Upper Body:     Right upper body: No supraclavicular, axillary or pectoral adenopathy.     Left upper body: No supraclavicular, axillary or pectoral adenopathy.  Skin:    General: Skin is warm and dry.     Coloration: Skin is not jaundiced or pale.     Findings: No bruising, erythema, lesion or rash.  Neurological:     General: No focal deficit present.     Mental Status: She is alert and oriented to person, place, and time. Mental status is at baseline.     Cranial Nerves: No cranial nerve deficit.     Sensory: No sensory deficit.     Motor: No weakness.     Coordination: Coordination normal.     Gait: Gait normal.     Deep Tendon Reflexes: Reflexes normal.   Psychiatric:        Mood and Affect: Mood normal.        Behavior: Behavior normal.        Thought Content: Thought content normal.        Judgment: Judgment normal.    LABS:      Latest Ref Rng & Units 09/16/2022   12:00 AM 08/30/2022   12:00 AM 08/23/2022   12:00 AM  CBC  WBC  7.4     7.5     9.5      Hemoglobin 12.0 - 16.0 12.4     13.0     12.3      Hematocrit 36 - 46 35     38     35      Platelets 150 - 400 K/uL 223     185     162         This result is from an external source.  Latest Ref Rng & Units 09/16/2022   12:00 AM 08/30/2022   12:00 AM 08/23/2022   12:00 AM  CMP  BUN 4 - 21 20     26     25       Creatinine 0.5 - 1.1 1.2     1.2     1.3      Sodium 137 - 147 138     137     138      Potassium 3.5 - 5.1 mEq/L 3.4     3.7     4.1      Chloride 99 - 108 105     104     105      CO2 13 - 22 19     27     22       Calcium 8.7 - 10.7 9.9     9.5     9.4      Alkaline Phos 25 - 125 83     72     72      AST 13 - 35 26     25     29       ALT 7 - 35 U/L 15     17     18          This result is from an external source.   Lab Results  Component Value Date   CEA1 2.9 09/10/2021   /  CEA  Date Value Ref Range Status  09/10/2021 2.9 0.0 - 4.7 ng/mL Final    Comment:    (NOTE)                             Nonsmokers          <3.9                             Smokers             <5.6 [employer] Electrochemiluminescence Immunoassay (ECLIA) Values obtained with different assay methods or kits cannot be used interchangeably.  Results cannot be interpreted as absolute evidence of the presence or absence of malignant disease. Performed At: [facility] [address] [place], [place] [id number] Jolene Schimke MD VH:[id number]    Component 08/30/22  Free T4 1.74   Component 08/30/22  Free T3 4.33   Component Ref Range & Units 2 wk ago (08/30/22) 3 wk ago (08/23/22) 1 mo ago (08/05/22) 1 mo ago (07/22/22) 2 mo ago (07/08/22) 2 mo ago (06/25/22) 3 mo  ago (06/10/22)  TSH 0.41 - 5.90 1.46 1.953 R, CM 2.443 R, CM 2.035 R, CM 2.103 R, CM 2.455 R, CM 2.595 R, CM     Component Ref Range & Units 06/10/22  Vitamin B-12 180 - 914 pg/mL 302   Component Ref Range & Units 06/10/22 2 yr ago 3 yr ago  Cholesterol 0 - 200 mg/dL 413    Triglycerides <244 mg/dL 010 272 R 536 High  R  HDL >40 mg/dL 77 64 R 62 R  Total CHOL/HDL Ratio RATIO 2.3 2.8 R, CM 3.3 R, CM  VLDL 0 - 40 mg/dL 29    LDL Cholesterol 0 - 99 mg/dL 70      No results found for: "PSA1" No results found for: "UYQ034" No results found for: "CAN125"  No results found for: "TOTALPROTELP", "ALBUMINELP", "A1GS", "  A2GS", "BETS", "BETA2SER", "GAMS", "MSPIKE", "SPEI" No results found for: "TIBC", "FERRITIN", "IRONPCTSAT" No results found for: "LDH"  STUDIES:     Echocardiogram: 07/02/2022 IMPRESSIONS:  1. Left ventricular ejection fraction, by estimation, is 60 to 65%. The  left ventricle has normal function. The left ventricle has no regional  wall motion abnormalities. Left ventricular diastolic parameters are  consistent with Grade I diastolic  dysfunction (impaired relaxation).GLS-18.3%   2. Right ventricular systolic function is normal. The right ventricular  size is normal. There is normal pulmonary artery systolic pressure.   3. Left atrial size was mildly dilated.   4. The mitral valve is normal in structure. Moderate mitral valve  regurgitation. No evidence of mitral stenosis.   5. The aortic valve is normal in structure. Aortic valve regurgitation is  not visualized. No aortic stenosis is present.   6. The inferior vena cava is normal in size with greater than 50%  respiratory variability, suggesting right atrial pressure of 3 mmHg.   EXAM: 03/22/2022 NUCLEAR MEDICINE PET SKULL BASE TO THIGH IMPRESSION: 1. Resolution of metabolic activity associated with the stomach. 2. Decrease in size of omental nodularity. No associated metabolic activity. 3. No  evidence of new or progressive gastric carcinoma. 4. New peripheral consolidation in the RIGHT lower lobe with moderate metabolic activity. Favor focus of atelectasis versus less likely pneumonia. 5.  Aortic Atherosclerosis (ICD10-I70.0).     I,Jasmine M Lassiter,acting as a scribe for Dellia Beckwith, MD.,have documented all relevant documentation on the behalf of Dellia Beckwith, MD,as directed by  Dellia Beckwith, MD while in the presence of Dellia Beckwith, MD.

## 2022-09-17 ENCOUNTER — Encounter: Payer: Self-pay | Admitting: Oncology

## 2022-09-20 ENCOUNTER — Inpatient Hospital Stay: Payer: Medicare Other

## 2022-09-20 VITALS — BP 149/74 | HR 99 | Temp 98.1°F | Resp 16 | Ht 60.0 in | Wt 202.0 lb

## 2022-09-20 DIAGNOSIS — E876 Hypokalemia: Secondary | ICD-10-CM

## 2022-09-20 DIAGNOSIS — C168 Malignant neoplasm of overlapping sites of stomach: Secondary | ICD-10-CM

## 2022-09-20 DIAGNOSIS — R21 Rash and other nonspecific skin eruption: Secondary | ICD-10-CM | POA: Diagnosis not present

## 2022-09-20 DIAGNOSIS — C169 Malignant neoplasm of stomach, unspecified: Secondary | ICD-10-CM | POA: Diagnosis not present

## 2022-09-20 DIAGNOSIS — G629 Polyneuropathy, unspecified: Secondary | ICD-10-CM | POA: Diagnosis not present

## 2022-09-20 DIAGNOSIS — Z5111 Encounter for antineoplastic chemotherapy: Secondary | ICD-10-CM | POA: Diagnosis not present

## 2022-09-20 DIAGNOSIS — Z79899 Other long term (current) drug therapy: Secondary | ICD-10-CM | POA: Diagnosis not present

## 2022-09-20 DIAGNOSIS — J470 Bronchiectasis with acute lower respiratory infection: Secondary | ICD-10-CM | POA: Diagnosis not present

## 2022-09-20 MED ORDER — HEPARIN SOD (PORK) LOCK FLUSH 100 UNIT/ML IV SOLN
500.0000 [IU] | Freq: Once | INTRAVENOUS | Status: AC | PRN
  Administered 2022-09-20: 500 [IU]

## 2022-09-20 MED ORDER — SODIUM CHLORIDE 0.9 % IV SOLN: Freq: Once | INTRAVENOUS | Status: AC

## 2022-09-20 MED ORDER — SODIUM CHLORIDE 0.9% FLUSH
10.0000 mL | Freq: Once | INTRAVENOUS | Status: AC | PRN
  Administered 2022-09-20: 10 mL

## 2022-09-20 NOTE — Patient Instructions (Addendum)

## 2022-09-22 ENCOUNTER — Inpatient Hospital Stay: Payer: TRICARE For Life (TFL)

## 2022-09-22 VITALS — BP 141/62 | HR 100 | Temp 98.4°F | Resp 18 | Ht 60.0 in | Wt 196.8 lb

## 2022-09-22 DIAGNOSIS — G629 Polyneuropathy, unspecified: Secondary | ICD-10-CM | POA: Diagnosis not present

## 2022-09-22 DIAGNOSIS — J470 Bronchiectasis with acute lower respiratory infection: Secondary | ICD-10-CM | POA: Diagnosis not present

## 2022-09-22 DIAGNOSIS — R21 Rash and other nonspecific skin eruption: Secondary | ICD-10-CM | POA: Diagnosis not present

## 2022-09-22 DIAGNOSIS — E876 Hypokalemia: Secondary | ICD-10-CM

## 2022-09-22 DIAGNOSIS — Z5111 Encounter for antineoplastic chemotherapy: Secondary | ICD-10-CM | POA: Diagnosis not present

## 2022-09-22 DIAGNOSIS — C169 Malignant neoplasm of stomach, unspecified: Secondary | ICD-10-CM | POA: Diagnosis not present

## 2022-09-22 DIAGNOSIS — C168 Malignant neoplasm of overlapping sites of stomach: Secondary | ICD-10-CM

## 2022-09-22 DIAGNOSIS — Z79899 Other long term (current) drug therapy: Secondary | ICD-10-CM | POA: Diagnosis not present

## 2022-09-22 MED ORDER — SODIUM CHLORIDE 0.9 % IV SOLN: Freq: Once | INTRAVENOUS | Status: AC

## 2022-09-22 MED ORDER — HEPARIN SOD (PORK) LOCK FLUSH 100 UNIT/ML IV SOLN: 500.0000 [IU] | Freq: Once | INTRAVENOUS | Status: DC | PRN

## 2022-09-22 MED ORDER — SODIUM CHLORIDE 0.9% FLUSH: 10.0000 mL | Freq: Once | INTRAVENOUS | Status: DC | PRN

## 2022-09-22 NOTE — Patient Instructions (Signed)

## 2022-09-23 NOTE — Progress Notes (Addendum)
[facility]  [address] [place],  [place]  [address] [phone]  Clinic Day:09/29/22   Referring physician: Dellia Beckwith, MD  ASSESSMENT & PLAN:  Gastric adenocarcinoma This is a poorly differentiated adenocarcinoma with signet ring features and ulceration. Stain for HER2 was negative at 0. I would consider it as metastatic to the omentum and so that would stage this as a T4 N0 M1, stage IVB.  She has been on treatment with chemotherapy with FOLFOX and nivolumab for 10 cycles with very good response, but then had increasing dermatologic toxicities and neuropathy. Her PET scan from 03/22/2022 shows a new area of hypermetabolic activity in the right lower lobe of peripheral consolidation, which is likely atelectasis or resolving pneumonia measuring 3cm. The thickening of her stomach has decreased with decreased metabolic activity and there is shrinkage of the omental metastasis from 22 mm to 13 mm. She had a severe rash and fever and was admitted to the hospital, and she was also found to have pneumonia and a UTI at that time. We are not sure which medication caused her rashes. Dr. Jennye Boroughs did a EGD on March 5th and found persistent poorly differentiated adenocarcinoma and a 3 cm mass of the distal body of the stomach. Since her current treatment is palliating her disease, we will continue. She also had a CT chest, abdomen, and pelvis on 08/30/2022 that revealed mild persistent gastric wall thickening with subtle adjacent omental nodularity, consistent with treated gastric malignancy,    Immune Mediated Nephritis Her creatinine is improved with normal saline IV and she was encouraged to drink more fluids. Renal ultrasound was normal. We felt this was caused by her immunotherapy and placed her on Prednisone for a short course and low dose, which was effective. I tapered her off completely as of the end of April. Her creatinine went up a little bit last  time to 1.27 and then 1.5 two weeks ago. I therefore gave her IV fluids and rechecked last week and it was down to 1.2 and remains stable today at 1.2. We will continue IV fluids with her chemotherapy treatments.     Plan She had an X-ray of her left shoulder on 08/23/2022 that revealed arthritis of the left shoulder with no acute or destructive bony abnormality. She also had a CT chest, abdomen, and pelvis on 08/30/2022 that revealed mild persistent gastric wall thickening with subtle adjacent omental nodularity, consistent with treated gastric malignancy, no evidence of lymphadenopathy or distant metastatic disease in the chest, abdomen, or pelvis. There is unchanged, asymmetric fibrosis and bronchiectasis of the right lung base, most likely related to infection or aspiration given asymmetric distribution. Her day 1 cycle 21 of FOLFOX + Nivolumab is scheduled for 10/04/2022, she will also have IV fluids on the day of chemo and another infusion of IV fluids 2 days after.  Her hemoglobin is 12.5, WBC is 8.5, and platelet count is 207,000 today. Her CMP is pending today. She is taking 2 pills of Potassium supplement daily. She informed me that she wants to start a supplement called Airborne immune support, and I do not know about most of the herbal components of the supplement. I told her I am worried about the potential interaction with her chemo medications. I advised her to wear a mask and to stay away from possible sources of infection. [name]  back in 2 weeks with CBC and CMP. This patient is accompanied in the office by  her  daughter . I reviewed all of this with her and her daughter and answered their questions. She understands and agrees with this plan   I provided 26 minutes of face-to-face time during this this encounter and > 50% was spent counseling as documented under my assessment and plan.   Dellia Beckwith, MD [facility] [address] [place] [address] Dept: [phone] Dept Fax: [phone]     CHIEF COMPLAINT:  CC: Gastric cancer  Current Treatment: Chemotherapy/immunotherapy  HISTORY OF PRESENT ILLNESS:  ODA BREER is a 87 y.o. female with a history of gastric cancer who is referred in consultation with Dr. Shara Blazing for assessment and management.  She had noticed that she was having regurgitation when eating and had lost over 30 pounds.  An ultrasound was done, revealing hepatic steatosis, and led to an MRI scan on June 5 which revealed gastric wall thickening with confluent nodularity of the omentum anterior to the stomach measuring 3.5 cm consistent with metastatic tumor.  She also had low-grade edema and wall thickening extending into the duodenum from the stomach.  She was referred to Dr. Shara Blazing and he did an EGD on July 7.  This revealed a large ulceration measuring 1.2 cm along the greater curvature.  She also had diffusely edematous and erythematous wall with erosions of the antrum and stiff and friable mucosa with oozing of blood.  These findings extended to the gastric fundus as well.  Pathology revealed a poorly differentiated adenocarcinoma with signet ring features from the biopsies of the ulcer as well as the antrum and the fundus of the stomach.  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  She was placed on omeprazole.  Her test for H. pylori was negative.  She was referred to Dr. Hardin Negus for consideration of surgery but he felt this was not resectable because of the extensive involvement and I agree.  I would consider this extending to the duodenum and also metastatic to the omentum.  PET scan confirmed these findings and she wished to pursue systemic intravenous therapy.  She continues to eat and has adjusted her diet to softer foods and liquids and is drinking boost so she has maintained her weight.  She does have  some Zofran ODT for nausea when needed.  She has had some anorexia, nausea, occasional vomiting, early satiety, dysphagia, and belching.  A CEA and CA 19-9 were normal. She has been started on FOLFOX chemotherapy along with immunotherapy.  I have reviewed her chart and materials related to her cancer extensively and collaborated history with the patient. Summary of oncologic history is as follows: Oncology History  Gastric cancer (HCC)  09/10/2021 Initial Diagnosis   Gastric cancer (HCC)   09/10/2021 Cancer Staging   Staging form: Stomach, AJCC 8th Edition - Clinical stage from 09/10/2021: Stage IVB (cT4b, cN0, cM1) - Signed by Dellia Beckwith, MD on 09/10/2021 Histopathologic type: Adenocarcinoma, NOS Stage prefix: Initial diagnosis Total positive nodes: 0 Histologic grade (G): G3 Histologic grading system: 3 grade system Sites of metastasis: Peritoneal surface Diagnostic confirmation: Positive histology PLUS positive immunophenotyping and/or positive genetic studies Specimen type: Endoscopy with Biopsy Staged by: Managing physician Carcinoembryonic antigen (CEA) (ng/mL): 2.8 Carbohydrate antigen 19-9 (CA 19-9) (U/mL): 4.9 HER2 status: Unknown Microsatellite instability (MSI): Unknown Tumor location in stomach: Other Clinical staging modalities: Biopsy, Endoscopy Stage used in treatment planning: Yes National guidelines used  in treatment planning: Yes Type of national guideline used in treatment planning: NCCN   09/28/2021 - 10/14/2021 Chemotherapy   Patient is on Treatment Plan : GASTROESOPHAGEAL FOLFOX + Nivolumab q14d     09/28/2021 -  Chemotherapy   Patient is on Treatment Plan : GASTROESOPHAGEAL FOLFOX + Nivolumab q14d     12/09/2021 Genetic Testing   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  Report date is 12/09/2021.   The Multi-Cancer + RNA Panel offered by [facility] includes sequencing and/or deletion/duplication analysis of  the following 84 genes:  AIP*, ALK, APC*, ATM*, AXIN2*, BAP1*, BARD1*, BLM*, BMPR1A*, BRCA1*, BRCA2*, BRIP1*, CASR, CDC73*, CDH1*, CDK4, CDKN1B*, CDKN1C*, CDKN2A, CEBPA, CHEK2*, CTNNA1*, DICER1*, DIS3L2*, EGFR, EPCAM, FH*, FLCN*, GATA2*, GPC3, GREM1, HOXB13, HRAS, KIT, MAX*, MEN1*, MET, MITF, MLH1*, MSH2*, MSH3*, MSH6*, MUTYH*, NBN*, NF1*, NF2*, NTHL1*, PALB2*, PDGFRA, PHOX2B, PMS2*, POLD1*, POLE*, POT1*, PRKAR1A*, PTCH1*, PTEN*, RAD50*, RAD51C*, RAD51D*, RB1*, RECQL4, RET, RUNX1*, SDHA*, SDHAF2*, SDHB*, SDHC*, SDHD*, SMAD4*, SMARCA4*, SMARCB1*, SMARCE1*, STK11*, SUFU*, TERC, TERT, TMEM127*, Tp53*, TSC1*, TSC2*, VHL*, WRN*, and WT1.  RNA analysis is performed for * genes.     INTERVAL HISTORY:  Falana is here today for a follow-up for her gastric cancer, she continues maintenance treatment with 5FU, Leucovorin, and Nivolumab. Patient states that she feels well but complains of a poking sensation in her upper shoulder blade. She had an X-ray of her left shoulder on 08/23/2022 that revealed arthritis of the left shoulder with no acute or destructive bony abnormality. She also had a CT chest, abdomen, and pelvis on 08/30/2022 that revealed mild persistent gastric wall thickening with subtle adjacent omental nodularity, consistent with treated gastric malignancy, no evidence of lymphadenopathy or distant metastatic disease in the chest, abdomen, or pelvis. There is unchanged, asymmetric fibrosis and bronchiectasis of the right lung base, most likely related to infection or aspiration given asymmetric distribution. Her day 1 cycle 21 of FOLFOX + Nivolumab is scheduled for 10/04/2022, she will also have IV fluids on the day of chemo and another infusion of IV fluids 2 days after.  Her hemoglobin is 12.5, WBC is 8.5, and platelet count is 207,000 today. Her CMP is pending today. She is taking 2 pills of Potassium supplement daily. She informed me that she wants to start a supplement called Airborne Immune support, and  I do not know about most of the herbal components of the supplement. I told her I am worried about the potential interaction with her chemo medications. I advised her to wear a mask and to stay away from possible sources of infection. [name]  back in 2 weeks with CBC and CMP. She  denies signs of infections such as sore throat, sinus drainage, cough or urinary symptoms. She  denies fever or recurrent chills. She  also deny nausea, vomiting, chest pain, or dyspnea. 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  If that happens, she is to call us so that we can schedule her for an ultrasound to look for synovitis.     Essential hypertension 09/09/2016   Gastric cancer (HCC) 09/10/2021   Hyperlipidemia 09/01/2016   Hypertension    Hypothyroidism 09/01/2016   Osteoarthritis of both feet 01/20/2016   Osteoarthritis, hand 01/20/2016   Thyroid disease   Degenerative disc disease History of endometriosis  Past Surgical History:  Procedure Laterality Date   APPENDECTOMY     LAPAROSCOPIC APPENDECTOMY N/A 04/10/2016   Procedure: APPENDECTOMY LAPAROSCOPIC;  Surgeon: Abigail Miyamoto, MD;  Location: [facility] OR;  Service: General;  Laterality: N/A;  Bilateral tubal ligation Total hysterectomy and bilateral salpingo-oophorectomy in 1980  Family History  Problem Relation Age of Onset    Hypertension Mother    Prostate cancer Father        metastatic; d. 73   Brain cancer Sister 11   Breast cancer Sister 12   AAA (abdominal aortic aneurysm) Brother    Leukemia Cousin        x2 maternal female cousins; d. before 55   Breast cancer Daughter 66       DCIS  Her sister has had breast cancer as well as a tumor of her head Her daughter has had breast cancer last year  Social History:  reports that she has never smoked. She has never used smokeless tobacco. She reports that she does not currently use alcohol. She reports that she does not use drugs.The patient is accompanied by her son, daughter-in-law and daughter today.  She is single but lives with a significant other.  She has the 2 children.  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  She grew up in [place] and [place].  She is active and healthy, especially for her age.  Allergies:  Allergies  Allergen Reactions   Doxycycline Rash   Sulfa Antibiotics Rash and Other (See Comments)    Other reaction(s): Other (See Comments)  "Made me feel weird"    Current Medications: Current Outpatient Medications  Medication Sig Dispense Refill   aspirin 81 MG EC tablet Take 81 mg by mouth daily. Swallow whole.     b complex vitamins capsule Take 1 capsule by mouth daily.     Calcium Carbonate (CALCIUM 600 PO) Take 1 tablet by mouth daily.     Cholecalciferol (VITAMIN D3) 5000 units CAPS Take 1 capsule by mouth daily.     EXFORGE HCT 5-160-12.5 MG TABS Take 1 tablet by mouth daily.     famotidine (PEPCID) 40 MG tablet Take 40 mg by mouth at bedtime.     KRILL OIL PO Take 1 capsule by mouth daily. Unknown strenght     Lactobacillus TABS Take 1 tablet by mouth 2 (two) times daily.     levothyroxine (SYNTHROID, LEVOTHROID) 75 MCG tablet Take 75 mcg by mouth daily before breakfast.      NON FORMULARY MMW: 3 parts Maalox 2 parts Benadryl 1 part viscious lidicaine  Disp. 6oz  Instructions: 5ml swish and  swallow every 3-4 hours     omeprazole (PRILOSEC) 40 MG capsule Take 1 capsule (40 mg total) by mouth 2 (two) times daily. 60 capsule 5   ondansetron (ZOFRAN-ODT) 4 MG disintegrating tablet Take 1 tablet (4 mg total) by mouth every 8 (eight) hours as needed for nausea or vomiting. 90 tablet 0   polyethylene glycol powder (GLYCOLAX/MIRALAX) 17 GM/SCOOP  powder SMARTSIG:1 scoopful By Mouth Daily     potassium chloride SA (KLOR-CON M) 20 MEQ tablet TAKE ONE TABLET BY MOUTH THREE TIMES DAILY (Patient taking differently: Take 20 mEq by mouth 3 (three) times daily. Patient reports taking one tablet twice daily) 90 tablet 5   pravastatin (PRAVACHOL) 20 MG tablet Take 1 tablet (20 mg total) by mouth every evening. 90 tablet 3   Probiotic Product (PROBIOTIC DAILY PO) Take 1 tablet by mouth daily.     prochlorperazine (COMPAZINE) 10 MG tablet Take 1 tablet (10 mg total) by mouth every 6 (six) hours as needed for nausea or vomiting. 90 tablet 3   triamcinolone 0.1% oint-Eucerin equivalent cream 1:1 mixture Apply topically 2 (two) times daily. 160 g 0   triamcinolone cream (KENALOG) 0.1 % Apply to affected skin twice daily (Patient taking differently: daily as needed. Apply to affected skin twice daily) 160 g 0   No current facility-administered medications for this visit.   REVIEW OF SYSTEMS:  Review of Systems  Constitutional: Negative.  Negative for appetite change, chills, diaphoresis, fatigue, fever and unexpected weight change.  HENT:   Negative for hearing loss, lump/mass, mouth sores, nosebleeds, sore throat, tinnitus, trouble swallowing and voice change.   Eyes:  Negative for eye problems and icterus.  Respiratory: Negative.  Negative for chest tightness, cough, hemoptysis, shortness of breath and wheezing.   Cardiovascular: Negative.  Negative for chest pain, leg swelling and palpitations.  Gastrointestinal:  Positive for constipation. Negative for abdominal distention, abdominal pain, blood in  stool, diarrhea, nausea, rectal pain and vomiting.  Endocrine: Negative.  Negative for hot flashes.  Genitourinary: Negative.  Negative for bladder incontinence, difficulty urinating, dyspareunia, dysuria, frequency, hematuria, menstrual problem, nocturia, pelvic pain, vaginal bleeding and vaginal discharge.   Musculoskeletal: Negative.  Negative for arthralgias, back pain, flank pain, gait problem, myalgias, neck pain and neck stiffness.       Left shoulder pain in the scapula area rating 5/10. (Constant and burning)  Skin: Negative.  Negative for itching, rash and wound.  Neurological:  Positive for numbness (in her fingers and toes). Negative for dizziness, extremity weakness, gait problem, headaches, light-headedness, seizures and speech difficulty.  Hematological:  Negative for adenopathy. Bruises/bleeds easily.  Psychiatric/Behavioral: Negative.  Negative for confusion, decreased concentration, depression, sleep disturbance and suicidal ideas. The patient is not nervous/anxious.    VITALS:  Blood pressure (!) 142/70, pulse 91, temperature 97.8 F (36.6 C), temperature source Oral, resp. rate 18, height 5' (1.524 m), weight 202 lb 12.8 oz (92 kg), SpO2 96%.  Wt Readings from Last 3 Encounters:  10/14/22 202 lb 1.6 oz (91.7 kg)  10/06/22 206 lb 0.6 oz (93.5 kg)  10/04/22 203 lb 1.9 oz (92.1 kg)    Body mass index is 39.61 kg/m.  Performance status (ECOG): 1 - Symptomatic but completely ambulatory  PHYSICAL EXAM:  Physical Exam Vitals and nursing note reviewed. Exam conducted with a chaperone present.  Constitutional:      General: She is not in acute distress.    Appearance: Normal appearance. She is normal weight. She is not ill-appearing, toxic-appearing or diaphoretic.  HENT:     Head: Normocephalic and atraumatic.     Right Ear: Tympanic membrane, ear canal and external ear normal. There is no impacted cerumen.     Left Ear: Tympanic membrane, ear canal and external ear  normal. There is no impacted cerumen.     Nose: Nose normal. No congestion or rhinorrhea.  Mouth/Throat:     Mouth: Mucous membranes are moist.     Pharynx: Oropharynx is clear. No oropharyngeal exudate or posterior oropharyngeal erythema.  Eyes:     General: No scleral icterus.       Right eye: No discharge.        Left eye: No discharge.     Extraocular Movements: Extraocular movements intact.     Conjunctiva/sclera: Conjunctivae normal.     Pupils: Pupils are equal, round, and reactive to light.  Neck:     Vascular: No carotid bruit.  Cardiovascular:     Rate and Rhythm: Normal rate and regular rhythm.     Pulses: Normal pulses.     Heart sounds: Normal heart sounds. No murmur heard.    No friction rub. No gallop.  Pulmonary:     Effort: Pulmonary effort is normal. No respiratory distress.     Breath sounds: Normal breath sounds. No stridor. No wheezing, rhonchi or rales.  Chest:     Chest wall: No tenderness.  Abdominal:     General: Bowel sounds are normal. There is no distension.     Palpations: Abdomen is soft. There is no hepatomegaly, splenomegaly or mass.     Tenderness: There is no abdominal tenderness. There is no right CVA tenderness, left CVA tenderness, guarding or rebound.     Hernia: No hernia is present.     Comments: Slight firmness in the epigastrium with deep palpation.    Musculoskeletal:        General: No swelling, tenderness, deformity or signs of injury.     Left shoulder: Normal range of motion.     Cervical back: Normal range of motion and neck supple. No rigidity or tenderness.     Right lower leg: Edema (trace) present.     Left lower leg: Edema (trace) present.  Lymphadenopathy:     Cervical: No cervical adenopathy.     Right cervical: No superficial, deep or posterior cervical adenopathy.    Left cervical: No superficial, deep or posterior cervical adenopathy.     Upper Body:     Right upper body: No supraclavicular, axillary or pectoral  adenopathy.     Left upper body: No supraclavicular, axillary or pectoral adenopathy.  Skin:    General: Skin is warm and dry.     Coloration: Skin is not jaundiced or pale.     Findings: No bruising, erythema, lesion or rash.  Neurological:     General: No focal deficit present.     Mental Status: She is alert and oriented to person, place, and time. Mental status is at baseline.     Cranial Nerves: No cranial nerve deficit.     Sensory: No sensory deficit.     Motor: No weakness.     Coordination: Coordination normal.     Gait: Gait normal.     Deep Tendon Reflexes: Reflexes normal.  Psychiatric:        Mood and Affect: Mood normal.        Behavior: Behavior normal.        Thought Content: Thought content normal.        Judgment: Judgment normal.    LABS:      Latest Ref Rng & Units 09/29/2022   12:00 AM 09/16/2022   12:00 AM 08/30/2022   12:00 AM  CBC  WBC  8.5     7.4     7.5      Hemoglobin 12.0 - 16.0 12.5  12.4     13.0      Hematocrit 36 - 46 36     35     38      Platelets 150 - 400 K/uL 207     223     185         This result is from an external source.      Latest Ref Rng & Units 09/29/2022   12:00 AM 09/16/2022   12:00 AM 08/30/2022   12:00 AM  CMP  BUN 4 - 21 23     20     26       Creatinine 0.5 - 1.1 1.3     1.2     1.2      Sodium 137 - 147 138     138     137      Potassium 3.5 - 5.1 mEq/L 4.1     3.4     3.7      Chloride 99 - 108 104     105     104      CO2 13 - 22 23     19     27       Calcium 8.7 - 10.7 10.1     9.9     9.5      Alkaline Phos 25 - 125 79     83     72      AST 13 - 35 37     26     25      ALT 7 - 35 U/L 22     15     17          This result is from an external source.   Lab Results  Component Value Date   CEA1 2.9 09/10/2021   /  CEA  Date Value Ref Range Status  09/10/2021 2.9 0.0 - 4.7 ng/mL Final    Comment:    (NOTE)                             Nonsmokers          <3.9                             Smokers              <5.6 [employer] Electrochemiluminescence Immunoassay (ECLIA) Values obtained with different assay methods or kits cannot be used interchangeably.  Results cannot be interpreted as absolute evidence of the presence or absence of malignant disease. Performed At: [facility] [address] [place], [place] [id number] Jolene Schimke MD NW:[id number]    Component Ref Range & Units 7 d ago (09/16/22) 3 wk ago (08/30/22) 1 mo ago (08/23/22) 1 mo ago (08/05/22) 2 mo ago (07/22/22) 2 mo ago (07/08/22) 3 mo ago (06/25/22)  TSH 0.350 - 4.500 uIU/mL 1.869 1.46 R 1.953 CM 2.443 CM 2.035 CM 2.103 CM 2.455 C            Component Ref Range & Units 7 d ago (09/16/22) 1 mo ago (08/23/22) 1 mo ago (08/05/22) 2 mo ago (07/22/22) 2 mo ago (07/08/22) 3 mo ago (06/25/22) 3 mo ago (06/10/22)  T4, Total 4.5 - 12.0 ug/dL 57.8 High  46.9 CM 62.9 CM 11.3 CM 10.5 CM 10.8 CM 10.5       Component  Ref Range & Units 06/10/22  Vitamin B-12 180 - 914 pg/mL 302   Component Ref Range & Units 06/10/22 2 yr ago 3 yr ago  Cholesterol 0 - 200 mg/dL 161    Triglycerides <096 mg/dL 045 409 R 811 High  R  HDL >40 mg/dL 77 64 R 62 R  Total CHOL/HDL Ratio RATIO 2.3 2.8 R, CM 3.3 R, CM  VLDL 0 - 40 mg/dL 29    LDL Cholesterol 0 - 99 mg/dL 70     No results found for: "PSA1" No results found for: "BJY782" No results found for: "CAN125"  No results found for: "TOTALPROTELP", "ALBUMINELP", "A1GS", "A2GS", "BETS", "BETA2SER", "GAMS", "MSPIKE", "SPEI" No results found for: "TIBC", "FERRITIN", "IRONPCTSAT" No results found for: "LDH"  STUDIES:  Exam: 08/30/2022 CT Chest, Abdomen, and Pelvis Impression: Mild, persistent gastric wall thickening with subtle adjacent omental nodularity, consistent with treated gastric malignancy. No evidence of lymphadenopathy or distant metastatic disease in the chest, abdomen, or pelvis.  Unchanged, asymmetric fibrosis and bronchiectasis of the right lung base, most  likely related to infection or aspiration given asymmetric distribution. Attention on follow-up. Aortic Atherosclerosis (ICD10-170.0)  Exam: 08/23/2022 Left Shoulder- 2+ View Impression Degenerative changes of the left shoulder. No acute or destructive bony abnormality.  Echocardiogram: 07/02/2022 IMPRESSIONS:  1. Left ventricular ejection fraction, by estimation, is 60 to 65%. The  left ventricle has normal function. The left ventricle has no regional  wall motion abnormalities. Left ventricular diastolic parameters are  consistent with Grade I diastolic  dysfunction (impaired relaxation).GLS-18.3%   2. Right ventricular systolic function is normal. The right ventricular  size is normal. There is normal pulmonary artery systolic pressure.   3. Left atrial size was mildly dilated.   4. The mitral valve is normal in structure. Moderate mitral valve  regurgitation. No evidence of mitral stenosis.   5. The aortic valve is normal in structure. Aortic valve regurgitation is  not visualized. No aortic stenosis is present.   6. The inferior vena cava is normal in size with greater than 50%  respiratory variability, suggesting right atrial pressure of 3 mmHg.    EXAM: 03/22/2022 NUCLEAR MEDICINE PET SKULL BASE TO THIGH IMPRESSION: 1. Resolution of metabolic activity associated with the stomach. 2. Decrease in size of omental nodularity. No associated metabolic activity. 3. No evidence of new or progressive gastric carcinoma. 4. New peripheral consolidation in the RIGHT lower lobe with moderate metabolic activity. Favor focus of atelectasis versus less likely pneumonia. 5.  Aortic Atherosclerosis (ICD10-I70.0).      I,Oluwatobi Asade,acting as a scribe for Dellia Beckwith, MD.,have documented all relevant documentation on the behalf of Dellia Beckwith, MD,as directed by  Dellia Beckwith, MD while in the presence of Dellia Beckwith, MD.

## 2022-09-27 ENCOUNTER — Encounter: Payer: Self-pay | Admitting: Oncology

## 2022-09-28 ENCOUNTER — Encounter: Payer: Self-pay | Admitting: Oncology

## 2022-09-29 ENCOUNTER — Inpatient Hospital Stay: Payer: Medicare Other

## 2022-09-29 ENCOUNTER — Inpatient Hospital Stay: Payer: Medicare Other | Admitting: Oncology

## 2022-09-29 ENCOUNTER — Encounter: Payer: Self-pay | Admitting: Oncology

## 2022-09-29 VITALS — BP 142/70 | HR 91 | Temp 97.8°F | Resp 18 | Ht 60.0 in | Wt 202.8 lb

## 2022-09-29 DIAGNOSIS — C168 Malignant neoplasm of overlapping sites of stomach: Secondary | ICD-10-CM | POA: Diagnosis not present

## 2022-09-29 DIAGNOSIS — Z79899 Other long term (current) drug therapy: Secondary | ICD-10-CM | POA: Diagnosis not present

## 2022-09-29 DIAGNOSIS — J470 Bronchiectasis with acute lower respiratory infection: Secondary | ICD-10-CM | POA: Diagnosis not present

## 2022-09-29 DIAGNOSIS — Z5111 Encounter for antineoplastic chemotherapy: Secondary | ICD-10-CM | POA: Diagnosis not present

## 2022-09-29 DIAGNOSIS — C169 Malignant neoplasm of stomach, unspecified: Secondary | ICD-10-CM | POA: Diagnosis not present

## 2022-09-29 DIAGNOSIS — G629 Polyneuropathy, unspecified: Secondary | ICD-10-CM | POA: Diagnosis not present

## 2022-09-29 DIAGNOSIS — N142 Nephropathy induced by unspecified drug, medicament or biological substance: Secondary | ICD-10-CM

## 2022-09-29 DIAGNOSIS — R21 Rash and other nonspecific skin eruption: Secondary | ICD-10-CM | POA: Diagnosis not present

## 2022-09-29 DIAGNOSIS — D649 Anemia, unspecified: Secondary | ICD-10-CM | POA: Diagnosis not present

## 2022-09-29 LAB — BASIC METABOLIC PANEL
BUN: 23 — AB (ref 4–21)
CO2: 23 — AB (ref 13–22)
Chloride: 104 (ref 99–108)
Creatinine: 1.3 — AB (ref 0.5–1.1)
Glucose: 146
Potassium: 4.1 mEq/L (ref 3.5–5.1)
Sodium: 138 (ref 137–147)

## 2022-09-29 LAB — COMPREHENSIVE METABOLIC PANEL
Albumin: 4.5 (ref 3.5–5.0)
Calcium: 10.1 (ref 8.7–10.7)

## 2022-09-29 LAB — HEPATIC FUNCTION PANEL
ALT: 22 U/L (ref 7–35)
AST: 37 — AB (ref 13–35)
Alkaline Phosphatase: 79 (ref 25–125)
Bilirubin, Total: 0.5

## 2022-09-29 LAB — CBC AND DIFFERENTIAL
HCT: 36 (ref 36–46)
Hemoglobin: 12.5 (ref 12.0–16.0)
Neutrophils Absolute: 4.42
Platelets: 207 10*3/uL (ref 150–400)
WBC: 8.5

## 2022-09-29 LAB — CBC W DIFFERENTIAL (~~LOC~~ CC SCANNED REPORT)

## 2022-09-29 LAB — COMPREHENSIVE METABOLIC PANEL (ASHBORO CC SCANNED REPORT)

## 2022-09-29 LAB — TSH: TSH: 1.113 u[IU]/mL (ref 0.350–4.500)

## 2022-09-29 LAB — CBC: RBC: 3.98 (ref 3.87–5.11)

## 2022-09-30 LAB — T4: T4, Total: 11.4 ug/dL (ref 4.5–12.0)

## 2022-10-01 ENCOUNTER — Other Ambulatory Visit: Payer: Self-pay | Admitting: Cardiology

## 2022-10-01 MED FILL — Leucovorin Calcium For Inj 350 MG: INTRAMUSCULAR | Qty: 31.5 | Status: AC

## 2022-10-01 MED FILL — Dexamethasone Sodium Phosphate Inj 100 MG/10ML: INTRAMUSCULAR | Qty: 1 | Status: AC

## 2022-10-01 MED FILL — Nivolumab IV Soln 240 MG/24ML: INTRAVENOUS | Qty: 24 | Status: AC

## 2022-10-01 MED FILL — Fluorouracil IV Soln 2.5 GM/50ML (50 MG/ML): INTRAVENOUS | Qty: 13 | Status: AC

## 2022-10-04 ENCOUNTER — Inpatient Hospital Stay: Payer: Medicare Other

## 2022-10-04 VITALS — BP 182/78 | HR 95 | Temp 99.2°F | Resp 14 | Ht 60.0 in | Wt 203.1 lb

## 2022-10-04 DIAGNOSIS — C169 Malignant neoplasm of stomach, unspecified: Secondary | ICD-10-CM | POA: Diagnosis not present

## 2022-10-04 DIAGNOSIS — Z79899 Other long term (current) drug therapy: Secondary | ICD-10-CM | POA: Diagnosis not present

## 2022-10-04 DIAGNOSIS — Z5111 Encounter for antineoplastic chemotherapy: Secondary | ICD-10-CM | POA: Diagnosis not present

## 2022-10-04 DIAGNOSIS — C168 Malignant neoplasm of overlapping sites of stomach: Secondary | ICD-10-CM

## 2022-10-04 DIAGNOSIS — G629 Polyneuropathy, unspecified: Secondary | ICD-10-CM | POA: Diagnosis not present

## 2022-10-04 DIAGNOSIS — R21 Rash and other nonspecific skin eruption: Secondary | ICD-10-CM | POA: Diagnosis not present

## 2022-10-04 DIAGNOSIS — J470 Bronchiectasis with acute lower respiratory infection: Secondary | ICD-10-CM | POA: Diagnosis not present

## 2022-10-04 MED ORDER — SODIUM CHLORIDE 0.9 % IV SOLN
10.0000 mg | Freq: Once | INTRAVENOUS | Status: AC
  Administered 2022-10-04: 10 mg via INTRAVENOUS
  Filled 2022-10-04: qty 10

## 2022-10-04 MED ORDER — FLUOROURACIL CHEMO INJECTION 2.5 GM/50ML
320.0000 mg/m2 | Freq: Once | INTRAVENOUS | Status: AC
  Administered 2022-10-04: 650 mg via INTRAVENOUS
  Filled 2022-10-04: qty 13

## 2022-10-04 MED ORDER — HEPARIN SOD (PORK) LOCK FLUSH 100 UNIT/ML IV SOLN: 500.0000 [IU] | Freq: Once | INTRAVENOUS | Status: DC | PRN

## 2022-10-04 MED ORDER — SODIUM CHLORIDE 0.9 % IV SOLN
1960.0000 mg/m2 | INTRAVENOUS | Status: DC
  Administered 2022-10-04: 3850 mg via INTRAVENOUS
  Filled 2022-10-04: qty 77

## 2022-10-04 MED ORDER — SODIUM CHLORIDE 0.9 % IV SOLN
240.0000 mg | Freq: Once | INTRAVENOUS | Status: AC
  Administered 2022-10-04: 240 mg via INTRAVENOUS
  Filled 2022-10-04: qty 24

## 2022-10-04 MED ORDER — SODIUM CHLORIDE 0.9 % IV SOLN: Freq: Once | INTRAVENOUS | Status: AC

## 2022-10-04 MED ORDER — PALONOSETRON HCL INJECTION 0.25 MG/5ML
0.2500 mg | Freq: Once | INTRAVENOUS | Status: AC
  Administered 2022-10-04: 0.25 mg via INTRAVENOUS
  Filled 2022-10-04: qty 5

## 2022-10-04 MED ORDER — DEXTROSE 5 % IV SOLN: Freq: Once | INTRAVENOUS | Status: DC

## 2022-10-04 MED ORDER — SODIUM CHLORIDE 0.9% FLUSH: 10.0000 mL | INTRAVENOUS | Status: DC | PRN

## 2022-10-04 MED ORDER — LEUCOVORIN CALCIUM INJECTION 350 MG
320.0000 mg/m2 | Freq: Once | INTRAVENOUS | Status: AC
  Administered 2022-10-04: 630 mg via INTRAVENOUS
  Filled 2022-10-04: qty 31.5

## 2022-10-04 NOTE — Patient Instructions (Signed)
Fluorouracil Injection What is this medication? FLUOROURACIL (flure oh YOOR a sil) treats some types of cancer. It works by slowing down the growth of cancer cells. This medicine may be used for other purposes; ask your health care provider or pharmacist if you have questions. COMMON BRAND NAME(S): Adrucil What should I tell my care team before I take this medication? They need to know if you have any of these conditions: Blood disorders Dihydropyrimidine dehydrogenase (DPD) deficiency Infection, such as chickenpox, cold sores, herpes Kidney disease Liver disease Poor nutrition Recent or ongoing radiation therapy An unusual or allergic reaction to fluorouracil, other medications, foods, dyes, or preservatives If you or your partner are pregnant or trying to get pregnant Breast-feeding How should I use this medication? This medication is injected into a vein. It is administered by your care team in a hospital or clinic setting. Talk to your care team about the use of this medication in children. Special care may be needed. Overdosage: If you think you have taken too much of this medicine contact a poison control center or emergency room at once. NOTE: This medicine is only for you. Do not share this medicine with others. What if I miss a dose? Keep appointments for follow-up doses. It is important not to miss your dose. Call your care team if you are unable to keep an appointment. What may interact with this medication? Do not take this medication with any of the following: Live virus vaccines This medication may also interact with the following: Medications that treat or prevent blood clots, such as warfarin, enoxaparin, dalteparin This list may not describe all possible interactions. Give your health care provider a list of all the medicines, herbs, non-prescription drugs, or dietary supplements you use. Also tell them if you smoke, drink alcohol, or use illegal drugs. Some items may  interact with your medicine. What should I watch for while using this medication? Your condition will be monitored carefully while you are receiving this medication. This medication may make you feel generally unwell. This is not uncommon as chemotherapy can affect healthy cells as well as cancer cells. Report any side effects. Continue your course of treatment even though you feel ill unless your care team tells you to stop. In some cases, you may be given additional medications to help with side effects. Follow all directions for their use. This medication may increase your risk of getting an infection. Call your care team for advice if you get a fever, chills, sore throat, or other symptoms of a cold or flu. Do not treat yourself. Try to avoid being around people who are sick. This medication may increase your risk to bruise or bleed. Call your care team if you notice any unusual bleeding. Be careful brushing or flossing your teeth or using a toothpick because you may get an infection or bleed more easily. If you have any dental work done, tell your dentist you are receiving this medication. Avoid taking medications that contain aspirin, acetaminophen, ibuprofen, naproxen, or ketoprofen unless instructed by your care team. These medications may hide a fever. Do not treat diarrhea with over the counter products. Contact your care team if you have diarrhea that lasts more than 2 days or if it is severe and watery. This medication can make you more sensitive to the sun. Keep out of the sun. If you cannot avoid being in the sun, wear protective clothing and sunscreen. Do not use sun lamps, tanning beds, or tanning booths. Talk to   your care team if you or your partner wish to become pregnant or think you might be pregnant. This medication can cause serious birth defects if taken during pregnancy and for 3 months after the last dose. A reliable form of contraception is recommended while taking this  medication and for 3 months after the last dose. Talk to your care team about effective forms of contraception. Do not father a child while taking this medication and for 3 months after the last dose. Use a condom while having sex during this time period. Do not breastfeed while taking this medication. This medication may cause infertility. Talk to your care team if you are concerned about your fertility. What side effects may I notice from receiving this medication? Side effects that you should report to your care team as soon as possible: Allergic reactions--skin rash, itching, hives, swelling of the face, lips, tongue, or throat Heart attack--pain or tightness in the chest, shoulders, arms, or jaw, nausea, shortness of breath, cold or clammy skin, feeling faint or lightheaded Heart failure--shortness of breath, swelling of the ankles, feet, or hands, sudden weight gain, unusual weakness or fatigue Heart rhythm changes--fast or irregular heartbeat, dizziness, feeling faint or lightheaded, chest pain, trouble breathing High ammonia level--unusual weakness or fatigue, confusion, loss of appetite, nausea, vomiting, seizures Infection--fever, chills, cough, sore throat, wounds that don't heal, pain or trouble when passing urine, general feeling of discomfort or being unwell Low red blood cell level--unusual weakness or fatigue, dizziness, headache, trouble breathing Pain, tingling, or numbness in the hands or feet, muscle weakness, change in vision, confusion or trouble speaking, loss of balance or coordination, trouble walking, seizures Redness, swelling, and blistering of the skin over hands and feet Severe or prolonged diarrhea Unusual bruising or bleeding Side effects that usually do not require medical attention (report to your care team if they continue or are bothersome): Dry skin Headache Increased tears Nausea Pain, redness, or swelling with sores inside the mouth or throat Sensitivity  to light Vomiting This list may not describe all possible side effects. Call your doctor for medical advice about side effects. You may report side effects to FDA at 1-800-FDA-1088. Where should I keep my medication? This medication is given in a hospital or clinic. It will not be stored at home. NOTE: This sheet is a summary. It may not cover all possible information. If you have questions about this medicine, talk to your doctor, pharmacist, or health care provider.  2024 [employer] Standard (2021-06-09 00:00:00) Leucovorin Injection What is this medication? LEUCOVORIN (loo koe VOR in) prevents side effects from certain medications, such as methotrexate. It works by increasing folate levels. This helps protect healthy cells in your body. It may also be used to treat anemia caused by low levels of folate. It can also be used with fluorouracil, a type of chemotherapy, to treat colorectal cancer. It works by increasing the effects of fluorouracil in the body. This medicine may be used for other purposes; ask your health care provider or pharmacist if you have questions. What should I tell my care team before I take this medication? They need to know if you have any of these conditions: Anemia from low levels of vitamin B12 in the blood An unusual or allergic reaction to leucovorin, folic acid, other medications, foods, dyes, or preservatives Pregnant or trying to get pregnant Breastfeeding How should I use this medication? This medication is injected into a vein or a muscle. It is given by your care team   in a hospital or clinic setting. Talk to your care team about the use of this medication in children. Special care may be needed. Overdosage: If you think you have taken too much of this medicine contact a poison control center or emergency room at once. NOTE: This medicine is only for you. Do not share this medicine with others. What if I miss a dose? Keep appointments for follow-up doses.  It is important not to miss your dose. Call your care team if you are unable to keep an appointment. What may interact with this medication? Capecitabine Fluorouracil Phenobarbital Phenytoin Primidone Trimethoprim;sulfamethoxazole This list may not describe all possible interactions. Give your health care provider a list of all the medicines, herbs, non-prescription drugs, or dietary supplements you use. Also tell them if you smoke, drink alcohol, or use illegal drugs. Some items may interact with your medicine. What should I watch for while using this medication? Your condition will be monitored carefully while you are receiving this medication. This medication may increase the side effects of 5-fluorouracil. Tell your care team if you have diarrhea or mouth sores that do not get better or that get worse. What side effects may I notice from receiving this medication? Side effects that you should report to your care team as soon as possible: Allergic reactions--skin rash, itching, hives, swelling of the face, lips, tongue, or throat This list may not describe all possible side effects. Call your doctor for medical advice about side effects. You may report side effects to FDA at 1-800-FDA-1088. Where should I keep my medication? This medication is given in a hospital or clinic. It will not be stored at home. NOTE: This sheet is a summary. It may not cover all possible information. If you have questions about this medicine, talk to your doctor, pharmacist, or health care provider.  2024 [employer] Standard (2021-07-07 00:00:00) Nivolumab Injection What is this medication? NIVOLUMAB (nye VOL ue mab) treats some types of cancer. It works by helping your immune system slow or stop the spread of cancer cells. It is a monoclonal antibody. This medicine may be used for other purposes; ask your health care provider or pharmacist if you have questions. COMMON BRAND NAME(S): Opdivo What should I  tell my care team before I take this medication? They need to know if you have any of these conditions: Allogeneic stem cell transplant (uses someone else's stem cells) Autoimmune diseases, such as Crohn disease, ulcerative colitis, lupus History of chest radiation Nervous system problems, such as Guillain-Barre syndrome or myasthenia gravis Organ transplant An unusual or allergic reaction to nivolumab, other medications, foods, dyes, or preservatives Pregnant or trying to get pregnant Breast-feeding How should I use this medication? This medication is infused into a vein. It is given in a hospital or clinic setting. A special MedGuide will be given to you before each treatment. Be sure to read this information carefully each time. Talk to your care team about the use of this medication in children. While it may be prescribed for children as young as 12 years for selected conditions, precautions do apply. Overdosage: If you think you have taken too much of this medicine contact a poison control center or emergency room at once. NOTE: This medicine is only for you. Do not share this medicine with others. What if I miss a dose? Keep appointments for follow-up doses. It is important not to miss your dose. Call your care team if you are unable to keep an appointment. What may   interact with this medication? Interactions have not been studied. This list may not describe all possible interactions. Give your health care provider a list of all the medicines, herbs, non-prescription drugs, or dietary supplements you use. Also tell them if you smoke, drink alcohol, or use illegal drugs. Some items may interact with your medicine. What should I watch for while using this medication? Your condition will be monitored carefully while you are receiving this medication. You may need blood work while taking this medication. This medication may cause serious skin reactions. They can happen weeks to months after  starting the medication. Contact your care team right away if you notice fevers or flu-like symptoms with a rash. The rash may be red or purple and then turn into blisters or peeling of the skin. You may also notice a red rash with swelling of the face, lips, or lymph nodes in your neck or under your arms. Tell your care team right away if you have any change in your eyesight. Talk to your care team if you are pregnant or think you might be pregnant. A negative pregnancy test is required before starting this medication. A reliable form of contraception is recommended while taking this medication and for 5 months after the last dose. Talk to your care team about effective forms of contraception. Do not breast-feed while taking this medication and for 5 months after the last dose. What side effects may I notice from receiving this medication? Side effects that you should report to your care team as soon as possible: Allergic reactions--skin rash, itching, hives, swelling of the face, lips, tongue, or throat Dry cough, shortness of breath or trouble breathing Eye pain, redness, irritation, or discharge with blurry or decreased vision Heart muscle inflammation--unusual weakness or fatigue, shortness of breath, chest pain, fast or irregular heartbeat, dizziness, swelling of the ankles, feet, or hands Hormone gland problems--headache, sensitivity to light, unusual weakness or fatigue, dizziness, fast or irregular heartbeat, increased sensitivity to cold or heat, excessive sweating, constipation, hair loss, increased thirst or amount of urine, tremors or shaking, irritability Infusion reactions--chest pain, shortness of breath or trouble breathing, feeling faint or lightheaded Kidney injury (glomerulonephritis)--decrease in the amount of urine, red or dark brown urine, foamy or bubbly urine, swelling of the ankles, hands, or feet Liver injury--right upper belly pain, loss of appetite, nausea, light-colored  stool, dark yellow or brown urine, yellowing skin or eyes, unusual weakness or fatigue Pain, tingling, or numbness in the hands or feet, muscle weakness, change in vision, confusion or trouble speaking, loss of balance or coordination, trouble walking, seizures Rash, fever, and swollen lymph nodes Redness, blistering, peeling, or loosening of the skin, including inside the mouth Sudden or severe stomach pain, bloody diarrhea, fever, nausea, vomiting Side effects that usually do not require medical attention (report these to your care team if they continue or are bothersome): Bone, joint, or muscle pain Diarrhea Fatigue Loss of appetite Nausea Skin rash This list may not describe all possible side effects. Call your doctor for medical advice about side effects. You may report side effects to FDA at 1-800-FDA-1088. Where should I keep my medication? This medication is given in a hospital or clinic. It will not be stored at home. NOTE: This sheet is a summary. It may not cover all possible information. If you have questions about this medicine, talk to your doctor, pharmacist, or health care provider.  2024 [employer] Standard (2021-06-01 00:00:00)  

## 2022-10-06 ENCOUNTER — Inpatient Hospital Stay: Payer: Medicare Other

## 2022-10-06 VITALS — BP 134/65 | HR 87 | Temp 98.4°F | Resp 18 | Ht 60.0 in | Wt 206.0 lb

## 2022-10-06 DIAGNOSIS — J470 Bronchiectasis with acute lower respiratory infection: Secondary | ICD-10-CM | POA: Diagnosis not present

## 2022-10-06 DIAGNOSIS — C168 Malignant neoplasm of overlapping sites of stomach: Secondary | ICD-10-CM

## 2022-10-06 DIAGNOSIS — Z79899 Other long term (current) drug therapy: Secondary | ICD-10-CM | POA: Diagnosis not present

## 2022-10-06 DIAGNOSIS — C169 Malignant neoplasm of stomach, unspecified: Secondary | ICD-10-CM | POA: Diagnosis not present

## 2022-10-06 DIAGNOSIS — Z5111 Encounter for antineoplastic chemotherapy: Secondary | ICD-10-CM | POA: Diagnosis not present

## 2022-10-06 DIAGNOSIS — R21 Rash and other nonspecific skin eruption: Secondary | ICD-10-CM | POA: Diagnosis not present

## 2022-10-06 DIAGNOSIS — G629 Polyneuropathy, unspecified: Secondary | ICD-10-CM | POA: Diagnosis not present

## 2022-10-06 MED ORDER — SODIUM CHLORIDE 0.9% FLUSH
10.0000 mL | INTRAVENOUS | Status: DC | PRN
  Administered 2022-10-06: 10 mL

## 2022-10-06 MED ORDER — SODIUM CHLORIDE 0.9 % IV SOLN: Freq: Once | INTRAVENOUS | Status: AC

## 2022-10-06 MED ORDER — HEPARIN SOD (PORK) LOCK FLUSH 100 UNIT/ML IV SOLN
500.0000 [IU] | Freq: Once | INTRAVENOUS | Status: AC | PRN
  Administered 2022-10-06: 500 [IU]

## 2022-10-06 NOTE — Patient Instructions (Signed)
Fluorouracil Injection What is this medication? FLUOROURACIL (flure oh YOOR a sil) treats some types of cancer. It works by slowing down the growth of cancer cells. This medicine may be used for other purposes; ask your health care provider or pharmacist if you have questions. COMMON BRAND NAME(S): Adrucil What should I tell my care team before I take this medication? They need to know if you have any of these conditions: Blood disorders Dihydropyrimidine dehydrogenase (DPD) deficiency Infection, such as chickenpox, cold sores, herpes Kidney disease Liver disease Poor nutrition Recent or ongoing radiation therapy An unusual or allergic reaction to fluorouracil, other medications, foods, dyes, or preservatives If you or your partner are pregnant or trying to get pregnant Breast-feeding How should I use this medication? This medication is injected into a vein. It is administered by your care team in a hospital or clinic setting. Talk to your care team about the use of this medication in children. Special care may be needed. Overdosage: If you think you have taken too much of this medicine contact a poison control center or emergency room at once. NOTE: This medicine is only for you. Do not share this medicine with others. What if I miss a dose? Keep appointments for follow-up doses. It is important not to miss your dose. Call your care team if you are unable to keep an appointment. What may interact with this medication? Do not take this medication with any of the following: Live virus vaccines This medication may also interact with the following: Medications that treat or prevent blood clots, such as warfarin, enoxaparin, dalteparin This list may not describe all possible interactions. Give your health care provider a list of all the medicines, herbs, non-prescription drugs, or dietary supplements you use. Also tell them if you smoke, drink alcohol, or use illegal drugs. Some items may  interact with your medicine. What should I watch for while using this medication? Your condition will be monitored carefully while you are receiving this medication. This medication may make you feel generally unwell. This is not uncommon as chemotherapy can affect healthy cells as well as cancer cells. Report any side effects. Continue your course of treatment even though you feel ill unless your care team tells you to stop. In some cases, you may be given additional medications to help with side effects. Follow all directions for their use. This medication may increase your risk of getting an infection. Call your care team for advice if you get a fever, chills, sore throat, or other symptoms of a cold or flu. Do not treat yourself. Try to avoid being around people who are sick. This medication may increase your risk to bruise or bleed. Call your care team if you notice any unusual bleeding. Be careful brushing or flossing your teeth or using a toothpick because you may get an infection or bleed more easily. If you have any dental work done, tell your dentist you are receiving this medication. Avoid taking medications that contain aspirin, acetaminophen, ibuprofen, naproxen, or ketoprofen unless instructed by your care team. These medications may hide a fever. Do not treat diarrhea with over the counter products. Contact your care team if you have diarrhea that lasts more than 2 days or if it is severe and watery. This medication can make you more sensitive to the sun. Keep out of the sun. If you cannot avoid being in the sun, wear protective clothing and sunscreen. Do not use sun lamps, tanning beds, or tanning booths. Talk to  your care team if you or your partner wish to become pregnant or think you might be pregnant. This medication can cause serious birth defects if taken during pregnancy and for 3 months after the last dose. A reliable form of contraception is recommended while taking this  medication and for 3 months after the last dose. Talk to your care team about effective forms of contraception. Do not father a child while taking this medication and for 3 months after the last dose. Use a condom while having sex during this time period. Do not breastfeed while taking this medication. This medication may cause infertility. Talk to your care team if you are concerned about your fertility. What side effects may I notice from receiving this medication? Side effects that you should report to your care team as soon as possible: Allergic reactions--skin rash, itching, hives, swelling of the face, lips, tongue, or throat Heart attack--pain or tightness in the chest, shoulders, arms, or jaw, nausea, shortness of breath, cold or clammy skin, feeling faint or lightheaded Heart failure--shortness of breath, swelling of the ankles, feet, or hands, sudden weight gain, unusual weakness or fatigue Heart rhythm changes--fast or irregular heartbeat, dizziness, feeling faint or lightheaded, chest pain, trouble breathing High ammonia level--unusual weakness or fatigue, confusion, loss of appetite, nausea, vomiting, seizures Infection--fever, chills, cough, sore throat, wounds that don't heal, pain or trouble when passing urine, general feeling of discomfort or being unwell Low red blood cell level--unusual weakness or fatigue, dizziness, headache, trouble breathing Pain, tingling, or numbness in the hands or feet, muscle weakness, change in vision, confusion or trouble speaking, loss of balance or coordination, trouble walking, seizures Redness, swelling, and blistering of the skin over hands and feet Severe or prolonged diarrhea Unusual bruising or bleeding Side effects that usually do not require medical attention (report to your care team if they continue or are bothersome): Dry skin Headache Increased tears Nausea Pain, redness, or swelling with sores inside the mouth or throat Sensitivity  to light Vomiting This list may not describe all possible side effects. Call your doctor for medical advice about side effects. You may report side effects to FDA at 1-800-FDA-1088. Where should I keep my medication? This medication is given in a hospital or clinic. It will not be stored at home. NOTE: This sheet is a summary. It may not cover all possible information. If you have questions about this medicine, talk to your doctor, pharmacist, or health care provider.  2024 Elsevier/Gold Standard (2021-06-09 00:00:00) Dehydration, Adult Dehydration is a condition in which there is not enough water or other fluids in the body. This happens when a person loses more fluids than they take in. Important organs cannot work right without the right amount of fluids. Any loss of fluids from the body can cause dehydration. Dehydration can be mild, worse, or very bad. It should be treated right away to keep it from getting very bad. What are the causes? Conditions that cause loss of water in the body. They include: Watery poop (diarrhea). Vomiting. Sweating a lot. Fever. Infection. Peeing (urinating) a lot. Not drinking enough fluids. Certain medicines, such as medicines that take extra fluid out of the body (diuretics). Lack of safe drinking water. Not being able to get enough water and food. What increases the risk? Having a long-term (chronic) illness that has not been treated the right way, such as: Diabetes. Heart disease. Kidney disease. Being 47 years of age or older. Having a disability. Living in a place  that is high above the ground or sea (high in altitude). The thinner, drier air causes more fluid loss. Doing exercises that put stress on your body for a long time. Being active when in hot places. What are the signs or symptoms? Symptoms of dehydration depend on how bad it is. Mild or worse dehydration Thirst. Dry lips or dry mouth. Feeling dizzy or light-headed. Muscle  cramps. Passing little pee or dark pee. Pee may be the color of tea. Headache. Very bad dehydration Changes in skin. Skin may: Be cold to the touch (clammy). Be blotchy or pale. Not go back to normal right after you pinch it and let it go. Little or no tears, pee, or sweat. Fast breathing. Low blood pressure. Weak pulse. Pulse that is more than 100 beats a minute when you are sitting still. Other changes, such as: Feeling very thirsty. Eyes that look hollow (sunken). Cold hands and feet. Being confused. Being very tired (lethargic) or having trouble waking from sleep. Losing weight. Loss of consciousness. How is this treated? Treatment for this condition depends on how bad your dehydration is. Treatment should start right away. Do not wait until your condition gets very bad. Very bad dehydration is an emergency. You will need to go to a hospital. Mild or worse dehydration can be treated at home. You may be asked to: Drink more fluids. Drink an oral rehydration solution (ORS). This drink gives you the right amount of fluids, salts, and minerals (electrolytes). Very bad dehydration can be treated: With fluids through an IV tube. By correcting low levels of electrolytes in the body. By treating the problem that caused your dehydration. Follow these instructions at home: Oral rehydration solution If told by your doctor, drink an ORS: Make an ORS. Use instructions on the package. Start by drinking small amounts, about  cup (120 mL) every 5-10 minutes. Slowly drink more until you have had the amount that your doctor said to have.  Eating and drinking  Drink enough clear fluid to keep your pee pale yellow. If you were told to drink an ORS, finish the ORS first. Then, start slowly drinking other clear fluids. Drink fluids such as: Water. Do not drink only water. Doing that can make the salt (sodium) level in your body get too low. Water from ice chips you suck on. Fruit juice that  you have added water to (diluted). Low-calorie sports drinks. Eat foods that have the right amounts of salts and minerals, such as bananas, oranges, potatoes, tomatoes, or spinach. Do not drink alcohol. Avoid drinks that have caffeine or sugar. These include:: High-calorie sports drinks. Fruit juice that you did not add water to. Soda. Coffee or energy drinks. Avoid foods that are greasy or have a lot of fat or sugar. General instructions Take over-the-counter and prescription medicines only as told by your doctor. Do not take sodium tablets. Doing that can make the salt level in your body get too high. Return to your normal activities as told by your doctor. Ask your doctor what activities are safe for you. Keep all follow-up visits. Your doctor may check and change your treatment. Contact a doctor if: You have pain in your belly (abdomen) and the pain: Gets worse. Stays in one place. You have a rash. You have a stiff neck. You get angry or annoyed more easily than normal. You are more tired or have a harder time waking than normal. You feel weak or dizzy. You feel very thirsty. Get help right  away if: You have any symptoms of very bad dehydration. You vomit every time you eat or drink. Your vomiting gets worse, does not go away, or you vomit blood or green stuff. You are getting treatment, but symptoms are getting worse. You have a fever. You have a very bad headache. You have: Diarrhea that gets worse or does not go away. Blood in your poop (stool). This may cause poop to look black and tarry. No pee in 6-8 hours. Only a small amount of pee in 6-8 hours, and the pee is very dark. You have trouble breathing. These symptoms may be an emergency. Get help right away. Call 911. Do not wait to see if the symptoms will go away. Do not drive yourself to the hospital. This information is not intended to replace advice given to you by your health care provider. Make sure you discuss  any questions you have with your health care provider. Document Revised: 08/31/2021 Document Reviewed: 08/31/2021 Elsevier Patient Education  2024 [employer].

## 2022-10-14 ENCOUNTER — Encounter: Payer: Self-pay | Admitting: Hematology and Oncology

## 2022-10-14 ENCOUNTER — Inpatient Hospital Stay: Payer: Medicare Other

## 2022-10-14 ENCOUNTER — Encounter: Payer: Self-pay | Admitting: Oncology

## 2022-10-14 ENCOUNTER — Inpatient Hospital Stay: Payer: Medicare Other | Admitting: Hematology and Oncology

## 2022-10-14 VITALS — BP 140/71 | HR 92 | Temp 98.3°F | Resp 18 | Ht 60.0 in | Wt 202.1 lb

## 2022-10-14 DIAGNOSIS — Z79899 Other long term (current) drug therapy: Secondary | ICD-10-CM | POA: Diagnosis not present

## 2022-10-14 DIAGNOSIS — C169 Malignant neoplasm of stomach, unspecified: Secondary | ICD-10-CM | POA: Diagnosis not present

## 2022-10-14 DIAGNOSIS — R21 Rash and other nonspecific skin eruption: Secondary | ICD-10-CM | POA: Diagnosis not present

## 2022-10-14 DIAGNOSIS — C168 Malignant neoplasm of overlapping sites of stomach: Secondary | ICD-10-CM

## 2022-10-14 DIAGNOSIS — N142 Nephropathy induced by unspecified drug, medicament or biological substance: Secondary | ICD-10-CM

## 2022-10-14 DIAGNOSIS — J470 Bronchiectasis with acute lower respiratory infection: Secondary | ICD-10-CM | POA: Diagnosis not present

## 2022-10-14 DIAGNOSIS — M25512 Pain in left shoulder: Secondary | ICD-10-CM

## 2022-10-14 DIAGNOSIS — Z5111 Encounter for antineoplastic chemotherapy: Secondary | ICD-10-CM | POA: Diagnosis not present

## 2022-10-14 DIAGNOSIS — G629 Polyneuropathy, unspecified: Secondary | ICD-10-CM | POA: Diagnosis not present

## 2022-10-14 DIAGNOSIS — G8929 Other chronic pain: Secondary | ICD-10-CM

## 2022-10-14 DIAGNOSIS — D649 Anemia, unspecified: Secondary | ICD-10-CM | POA: Diagnosis not present

## 2022-10-14 LAB — COMPREHENSIVE METABOLIC PANEL (ASHBORO CC SCANNED REPORT)

## 2022-10-14 LAB — BASIC METABOLIC PANEL
BUN: 19 (ref 4–21)
CO2: 22 (ref 13–22)
Chloride: 103 (ref 99–108)
Creatinine: 1.3 — AB (ref 0.5–1.1)
EGFR: 39
Glucose: 120
Potassium: 4.1 meq/L (ref 3.5–5.1)
Sodium: 138 (ref 137–147)

## 2022-10-14 LAB — CBC AND DIFFERENTIAL
HCT: 35 — AB (ref 36–46)
Hemoglobin: 12.3 (ref 12.0–16.0)
Neutrophils Absolute: 4.24
Platelets: 216 10*3/uL (ref 150–400)
WBC: 8

## 2022-10-14 LAB — HEPATIC FUNCTION PANEL
ALT: 19 U/L (ref 7–35)
AST: 25 (ref 13–35)
Alkaline Phosphatase: 69 (ref 25–125)
Bilirubin, Total: 0.5

## 2022-10-14 LAB — COMPREHENSIVE METABOLIC PANEL
Albumin: 4.2 (ref 3.5–5.0)
Calcium: 9.9 (ref 8.7–10.7)

## 2022-10-14 LAB — CBC: RBC: 3.92 (ref 3.87–5.11)

## 2022-10-14 LAB — CBC W DIFFERENTIAL (~~LOC~~ CC SCANNED REPORT)

## 2022-10-14 LAB — TSH: TSH: 1.442 u[IU]/mL (ref 0.350–4.500)

## 2022-10-14 NOTE — Assessment & Plan Note (Signed)
Left shoulder pain, improved. X-ray in July revealed degenerative changes without acute or destructive bony ambnormality.

## 2022-10-14 NOTE — Assessment & Plan Note (Addendum)
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  Stain for HER2 was negative.  PET scan revealed omental involvement, but no distant metastasis was seen. She is receiving palliative chemotherapy with FOLFOX/nivolumab (5-fluorouracil/oxaliplatin/nivolumab), which now consists of 5-fluorouracil/nivolumab.  Oxaliplatin was discontinued after 11 cycles.  PET revealed resolution of metabolic activity associated with the stomach.  Decrease in size of omental nodularity. No associated metabolic activity.   EGD in March revealed residual disease.  CT chest, abdomen and pelvis in July revealed stable disease with persistent mild gastric wall thickening and subtle adjacent omental nodularity.  She continues to tolerate treatment fairly well.  She receives extra IV fluids with her infusion to prevent recurrent acute kidney injury.  She will proceed with 22nd cycle next week.  We will plan to see her back in 2 weeks with a CBC, comprehensive metabolic panel, and TSH prior to a 23rd cycle.

## 2022-10-14 NOTE — Assessment & Plan Note (Signed)
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  Her creatinine remains at baseline.  We continue to give her IV fluids the week of her chemotherapy.

## 2022-10-14 NOTE — Progress Notes (Signed)
Mackenzie Lewis  [address] [place],  [place]  [address] [phone]  Clinic Day:  10/14/2022  Referring physician: Dellia Beckwith, MD  ASSESSMENT & PLAN:   Assessment & Plan: Gastric cancer (HCC) Stage IVB (T4 N0 M1) poorly differentiated adenocarcinoma of the stomach with signet ring features and ulceration, metastatic to the omentum.  Stain for HER2 was negative.  PET scan revealed omental involvement, but no distant metastasis was seen. She is receiving palliative chemotherapy with FOLFOX/nivolumab (5-fluorouracil/oxaliplatin/nivolumab), which now consists of 5-fluorouracil/nivolumab.  Oxaliplatin was discontinued after 11 cycles.  PET revealed resolution of metabolic activity associated with the stomach.  Decrease in size of omental nodularity. No associated metabolic activity.   EGD in March revealed residual disease.  CT chest, abdomen and pelvis in July revealed stable disease with persistent mild gastric wall thickening and subtle adjacent omental nodularity.  She continues to tolerate treatment fairly well.  She receives extra IV fluids with her infusion to prevent recurrent acute kidney injury.  She will proceed with 22nd cycle next week.  We will plan to see her back in 2 weeks with a CBC, comprehensive metabolic panel, and TSH prior to a 23rd cycle.  Drug-induced interstitial nephritis History immune mediated nephritis from immunotherapy in February, which resolved with prednisone.  Her creatinine remains at baseline.  We continue to give her IV fluids the week of her chemotherapy.  Shoulder pain Left shoulder pain, improved. X-ray in July revealed degenerative changes without acute or destructive bony ambnormality.    The patient understands the plans discussed today and is in agreement with them.  She knows to contact our office if she develops concerns prior to her next appointment.   I provided 15 minutes of face-to-face time during  this encounter and > 50% was spent counseling as documented under my assessment and plan.    Adah Perl, PA-C  [facility] [address] [place] [address] Dept: [phone] Dept Fax: [phone]   No orders of the defined types were placed in this encounter.     CHIEF COMPLAINT:  CC: Stage IVB adenocarcinoma of the stomach  Current Treatment: Infusional 5-fluorouracil/leucovorin/nivolumab every 2 weeks  HISTORY OF PRESENT ILLNESS:  Mackenzie Lewis is a 87 y.o. female with stage IVB (T4b, N0, M1) gastric cancer diagnosed in July 2023.  She was referred by Dr. Shara Blazing for assessment and management.  She had noticed that she was having regurgitation when eating and had lost over 30 pounds.  An ultrasound was done, revealing hepatic steatosis, and led to an MRI scan in June, which revealed gastric wall thickening with confluent nodularity of the omentum anterior to the stomach measuring 3.5 cm consistent with metastatic tumor.  She also had low-grade edema and wall thickening extending into the duodenum from the stomach.  She was referred to Dr. Shara Blazing and he did an EGD in July.  This revealed a large ulceration measuring 1.2 cm along the greater curvature.  She also had diffusely edematous and erythematous wall with erosions of the antrum and stiff and friable mucosa with oozing of blood.  These findings extended to the gastric fundus as well.  Pathology revealed a poorly differentiated adenocarcinoma with signet ring features from the biopsies of the ulcer as well as the antrum and the fundus of the stomach.  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  She was  placed on omeprazole.  Her test for H. pylori was negative.  She was referred to Dr. Hardin Negus for consideration of surgery, but he felt this was not resectable because of the extensive involvement.   We consider this extending to the duodenum and metastatic to the omentum.  PET scan confirmed these findings.  She wished to pursue systemic intravenous therapy.  CEA and CA 19-9 were normal. She has been receiving palliative FOLFOX/nivolumab, which now consists of 5-fluorouracil/leucovorin/nivolumab.  Oxaliplatin was discontinued after 11 cycles.  She has tolerated this fairly well.  PET in February revealed resolution of metabolic activity associated with the stomach. Decrease in size of omental nodularity without associated metabolic activity. No evidence of new or progressive gastric carcinoma. New peripheral consolidation in the RIGHT lower lobe with moderate metabolic activity. Favor focus of atelectasis versus less likely pneumonia. EGD in March revealed persistent disease.  CT chest, abdomen and pelvis in July revealed stable disease with persistent mild gastric wall thickening and subtle adjacent omental nodularity.    Oncology History  Gastric cancer (HCC)  09/10/2021 Initial Diagnosis   Gastric cancer (HCC)   09/10/2021 Cancer Staging   Staging form: Stomach, AJCC 8th Edition - Clinical stage from 09/10/2021: Stage IVB (cT4b, cN0, cM1) - Signed by Dellia Beckwith, MD on 09/10/2021 Histopathologic type: Adenocarcinoma, NOS Stage prefix: Initial diagnosis Total positive nodes: 0 Histologic grade (G): G3 Histologic grading system: 3 grade system Sites of metastasis: Peritoneal surface Diagnostic confirmation: Positive histology PLUS positive immunophenotyping and/or positive genetic studies Specimen type: Endoscopy with Biopsy Staged by: Managing physician Carcinoembryonic antigen (CEA) (ng/mL): 2.8 Carbohydrate antigen 19-9 (CA 19-9) (U/mL): 4.9 HER2 status: Unknown Microsatellite instability (MSI): Unknown Tumor location in stomach: Other Clinical staging modalities: Biopsy, Endoscopy Stage used in treatment planning: Yes National guidelines used in treatment planning:  Yes Type of national guideline used in treatment planning: NCCN   09/28/2021 - 10/14/2021 Chemotherapy   Patient is on Treatment Plan : GASTROESOPHAGEAL FOLFOX + Nivolumab q14d     09/28/2021 -  Chemotherapy   Patient is on Treatment Plan : GASTROESOPHAGEAL FOLFOX + Nivolumab q14d     12/09/2021 Genetic Testing   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  Report date is 12/09/2021.   The Multi-Cancer + RNA Panel offered by [facility] includes sequencing and/or deletion/duplication analysis of the following 84 genes:  AIP*, ALK, APC*, ATM*, AXIN2*, BAP1*, BARD1*, BLM*, BMPR1A*, BRCA1*, BRCA2*, BRIP1*, CASR, CDC73*, CDH1*, CDK4, CDKN1B*, CDKN1C*, CDKN2A, CEBPA, CHEK2*, CTNNA1*, DICER1*, DIS3L2*, EGFR, EPCAM, FH*, FLCN*, GATA2*, GPC3, GREM1, HOXB13, HRAS, KIT, MAX*, MEN1*, MET, MITF, MLH1*, MSH2*, MSH3*, MSH6*, MUTYH*, NBN*, NF1*, NF2*, NTHL1*, PALB2*, PDGFRA, PHOX2B, PMS2*, POLD1*, POLE*, POT1*, PRKAR1A*, PTCH1*, PTEN*, RAD50*, RAD51C*, RAD51D*, RB1*, RECQL4, RET, RUNX1*, SDHA*, SDHAF2*, SDHB*, SDHC*, SDHD*, SMAD4*, SMARCA4*, SMARCB1*, SMARCE1*, STK11*, SUFU*, TERC, TERT, TMEM127*, Tp53*, TSC1*, TSC2*, VHL*, WRN*, and WT1.  RNA analysis is performed for * genes.       INTERVAL HISTORY:  Lareine is here today for repeat clinical assessment prior to a 22nd cycle of 5-fluorouracil/leucovorin/nivolumab.  She continues to tolerate this well, except for fatigue.  She has had some constipation, for which she uses MiraLAX daily.  She moved her bowels yesterday.  She denies nausea or vomiting she reports stable neuropathy of her hands and feet. She denies fevers or chills. She denies pain. Her appetite is good. Her weight has been stable.  REVIEW OF SYSTEMS:  Review of Systems  Constitutional:  Positive for fatigue.  Negative for appetite change, chills, fever and unexpected weight change.  HENT:   Negative for lump/mass, mouth sores and sore throat.   Respiratory:   Negative for cough and shortness of breath.   Cardiovascular:  Negative for chest pain and leg swelling.  Gastrointestinal:  Positive for constipation. Negative for abdominal pain, diarrhea, nausea and vomiting.  Endocrine: Negative for hot flashes.  Genitourinary:  Negative for difficulty urinating, dysuria, frequency and hematuria.   Musculoskeletal:  Negative for arthralgias, back pain and myalgias.  Skin:  Negative for itching and rash.  Neurological:  Negative for dizziness and headaches.  Hematological:  Negative for adenopathy. Does not bruise/bleed easily.  Psychiatric/Behavioral:  Negative for depression and sleep disturbance. The patient is not nervous/anxious.      VITALS:  Blood pressure (!) 140/71, pulse 92, temperature 98.3 F (36.8 C), temperature source Oral, resp. rate 18, height 5' (1.524 m), weight 202 lb 1.6 oz (91.7 kg), SpO2 96%.  Wt Readings from Last 3 Encounters:  10/14/22 202 lb 1.6 oz (91.7 kg)  10/06/22 206 lb 0.6 oz (93.5 kg)  10/04/22 203 lb 1.9 oz (92.1 kg)    Body mass index is 39.47 kg/m.  Performance status (ECOG): 1 - Symptomatic but completely ambulatory  PHYSICAL EXAM:  Physical Exam Vitals and nursing note reviewed.  Constitutional:      General: She is not in acute distress.    Appearance: Normal appearance. She is not ill-appearing.  HENT:     Head: Normocephalic and atraumatic.     Mouth/Throat:     Mouth: Mucous membranes are moist.     Pharynx: Oropharynx is clear. No oropharyngeal exudate or posterior oropharyngeal erythema.  Eyes:     General: No scleral icterus.    Extraocular Movements: Extraocular movements intact.     Conjunctiva/sclera: Conjunctivae normal.     Pupils: Pupils are equal, round, and reactive to light.  Cardiovascular:     Rate and Rhythm: Normal rate and regular rhythm.     Heart sounds: Normal heart sounds. No murmur heard.    No friction rub. No gallop.  Pulmonary:     Effort: Pulmonary effort is normal.      Breath sounds: Normal breath sounds. No wheezing, rhonchi or rales.  Abdominal:     General: There is no distension.     Palpations: Abdomen is soft. There is no hepatomegaly, splenomegaly or mass.     Tenderness: There is no abdominal tenderness.  Musculoskeletal:        General: Normal range of motion.     Cervical back: Normal range of motion and neck supple. No tenderness.     Right lower leg: No edema.     Left lower leg: No edema.  Lymphadenopathy:     Cervical: No cervical adenopathy.     Upper Body:     Right upper body: No supraclavicular or axillary adenopathy.     Left upper body: No supraclavicular or axillary adenopathy.     Lower Body: No right inguinal adenopathy. No left inguinal adenopathy.  Skin:    General: Skin is warm and dry.     Coloration: Skin is not jaundiced.     Findings: No rash.  Neurological:     Mental Status: She is alert and oriented to person, place, and time.     Cranial Nerves: No cranial nerve deficit.  Psychiatric:        Mood and Affect: Mood normal.        Behavior:  Behavior normal.        Thought Content: Thought content normal.     LABS:      Latest Ref Rng & Units 09/29/2022   12:00 AM 09/16/2022   12:00 AM 08/30/2022   12:00 AM  CBC  WBC  8.5     7.4     7.5      Hemoglobin 12.0 - 16.0 12.5     12.4     13.0      Hematocrit 36 - 46 36     35     38      Platelets 150 - 400 K/uL 207     223     185         This result is from an external source.      Latest Ref Rng & Units 09/29/2022   12:00 AM 09/16/2022   12:00 AM 08/30/2022   12:00 AM  CMP  BUN 4 - 21 23     20     26       Creatinine 0.5 - 1.1 1.3     1.2     1.2      Sodium 137 - 147 138     138     137      Potassium 3.5 - 5.1 mEq/L 4.1     3.4     3.7      Chloride 99 - 108 104     105     104      CO2 13 - 22 23     19     27       Calcium 8.7 - 10.7 10.1     9.9     9.5      Alkaline Phos 25 - 125 79     83     72      AST 13 - 35 37     26     25      ALT 7 -  35 U/L 22     15     17          This result is from an external source.     Lab Results  Component Value Date   CEA1 2.9 09/10/2021   /  CEA  Date Value Ref Range Status  09/10/2021 2.9 0.0 - 4.7 ng/mL Final    Comment:    (NOTE)                             Nonsmokers          <3.9                             Smokers             <5.6 [employer] Electrochemiluminescence Immunoassay (ECLIA) Values obtained with different assay methods or kits cannot be used interchangeably.  Results cannot be interpreted as absolute evidence of the presence or absence of malignant disease. Performed At: [facility] [address] [place], [place] [id number] Jolene Schimke MD JX:[id number]    No results found for: "PSA1" No results found for: "[phone]" No results found for: "CAN125"  No results found for: "TOTALPROTELP", "ALBUMINELP", "A1GS", "A2GS", "BETS", "BETA2SER", "GAMS", "MSPIKE", "SPEI" No results found for: "TIBC", "FERRITIN", "IRONPCTSAT" No results found for: "LDH"  STUDIES:  No results found.  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  If that happens, she is to call us so that we can schedule her for an ultrasound to look for synovitis.     Essential hypertension 09/09/2016   Gastric cancer (HCC) 09/10/2021   Hyperlipidemia 09/01/2016   Hypertension    Hypothyroidism 09/01/2016   Osteoarthritis of both feet 01/20/2016    Osteoarthritis, hand 01/20/2016   Thyroid disease     Past Surgical History:  Procedure Laterality Date   APPENDECTOMY     LAPAROSCOPIC APPENDECTOMY N/A 04/10/2016   Procedure: APPENDECTOMY LAPAROSCOPIC;  Surgeon: Abigail Miyamoto, MD;  Location: [facility] OR;  Service: General;  Laterality: N/A;    Family History  Problem Relation Age of Onset   Hypertension Mother    Prostate cancer Father        metastatic; d. 66   Brain cancer Sister 16   Breast cancer Sister 57   AAA (abdominal aortic aneurysm) Brother    Leukemia Cousin        x2 maternal female cousins; d. before 41   Breast cancer Daughter 79       DCIS    Social History:  reports that she has never smoked. She has never used smokeless tobacco. She reports that she does not currently use alcohol. She reports that she does not use drugs.The patient is accompanied by her boyfriend and her daughter today.  Allergies:  Allergies  Allergen Reactions   Doxycycline Rash   Sulfa Antibiotics Rash and Other (See Comments)    Other reaction(s): Other (See Comments)  "Made me feel weird"    Current Medications: Current Outpatient Medications  Medication Sig Dispense Refill   aspirin 81 MG EC tablet Take 81 mg by mouth daily. Swallow whole.     b complex vitamins capsule Take 1 capsule by mouth daily.     Calcium Carbonate (CALCIUM 600 PO) Take 1 tablet by mouth daily.     Cholecalciferol (VITAMIN D3) 5000 units CAPS Take 1 capsule by mouth daily.     EXFORGE HCT 5-160-12.5 MG TABS Take 1 tablet by mouth daily.     famotidine (PEPCID) 40 MG tablet Take 40 mg by mouth at bedtime.     KRILL OIL PO Take 1 capsule by mouth daily. Unknown strenght     Lactobacillus TABS Take 1 tablet by mouth 2 (two) times daily.     levothyroxine (SYNTHROID, LEVOTHROID) 75 MCG tablet Take 75 mcg by mouth daily before breakfast.      NON FORMULARY MMW: 3 parts Maalox 2 parts Benadryl 1 part viscious lidicaine  Disp. 6oz  Instructions: 5ml swish  and swallow every 3-4 hours     omeprazole (PRILOSEC) 40 MG capsule Take 1 capsule (40 mg total) by mouth 2 (two) times daily. 60 capsule 5   ondansetron (ZOFRAN-ODT) 4 MG disintegrating tablet Take 1 tablet (4 mg total) by mouth every 8 (eight) hours as needed for nausea or vomiting. 90 tablet 0  polyethylene glycol powder (GLYCOLAX/MIRALAX) 17 GM/SCOOP powder SMARTSIG:1 scoopful By Mouth Daily     potassium chloride SA (KLOR-CON M) 20 MEQ tablet TAKE ONE TABLET BY MOUTH THREE TIMES DAILY (Patient taking differently: Take 20 mEq by mouth 3 (three) times daily. Patient reports taking one tablet twice daily) 90 tablet 5   pravastatin (PRAVACHOL) 20 MG tablet Take 1 tablet (20 mg total) by mouth every evening. 90 tablet 3   Probiotic Product (PROBIOTIC DAILY PO) Take 1 tablet by mouth daily.     prochlorperazine (COMPAZINE) 10 MG tablet Take 1 tablet (10 mg total) by mouth every 6 (six) hours as needed for nausea or vomiting. 90 tablet 3   triamcinolone 0.1% oint-Eucerin equivalent cream 1:1 mixture Apply topically 2 (two) times daily. 160 g 0   triamcinolone cream (KENALOG) 0.1 % Apply to affected skin twice daily (Patient taking differently: daily as needed. Apply to affected skin twice daily) 160 g 0   No current facility-administered medications for this visit.

## 2022-10-15 LAB — T4: T4, Total: 10.4 ug/dL (ref 4.5–12.0)

## 2022-10-15 MED FILL — Nivolumab IV Soln 240 MG/24ML: INTRAVENOUS | Qty: 24 | Status: AC

## 2022-10-15 MED FILL — Dexamethasone Sodium Phosphate Inj 100 MG/10ML: INTRAMUSCULAR | Qty: 1 | Status: AC

## 2022-10-15 MED FILL — Fluorouracil IV Soln 5 GM/100ML (50 MG/ML): INTRAVENOUS | Qty: 77 | Status: AC

## 2022-10-15 MED FILL — Fluorouracil IV Soln 2.5 GM/50ML (50 MG/ML): INTRAVENOUS | Qty: 13 | Status: AC

## 2022-10-17 ENCOUNTER — Encounter: Payer: Self-pay | Admitting: Oncology

## 2022-10-19 ENCOUNTER — Inpatient Hospital Stay: Payer: Medicare Other | Attending: Hematology and Oncology

## 2022-10-19 VITALS — BP 155/67 | HR 98 | Temp 98.4°F | Resp 18 | Ht 60.0 in | Wt 202.1 lb

## 2022-10-19 DIAGNOSIS — C168 Malignant neoplasm of overlapping sites of stomach: Secondary | ICD-10-CM

## 2022-10-19 DIAGNOSIS — C786 Secondary malignant neoplasm of retroperitoneum and peritoneum: Secondary | ICD-10-CM | POA: Insufficient documentation

## 2022-10-19 DIAGNOSIS — I129 Hypertensive chronic kidney disease with stage 1 through stage 4 chronic kidney disease, or unspecified chronic kidney disease: Secondary | ICD-10-CM | POA: Diagnosis not present

## 2022-10-19 DIAGNOSIS — Z5111 Encounter for antineoplastic chemotherapy: Secondary | ICD-10-CM | POA: Diagnosis not present

## 2022-10-19 DIAGNOSIS — N182 Chronic kidney disease, stage 2 (mild): Secondary | ICD-10-CM | POA: Diagnosis not present

## 2022-10-19 DIAGNOSIS — Z7982 Long term (current) use of aspirin: Secondary | ICD-10-CM | POA: Diagnosis not present

## 2022-10-19 DIAGNOSIS — C169 Malignant neoplasm of stomach, unspecified: Secondary | ICD-10-CM | POA: Diagnosis not present

## 2022-10-19 DIAGNOSIS — K760 Fatty (change of) liver, not elsewhere classified: Secondary | ICD-10-CM | POA: Diagnosis not present

## 2022-10-19 DIAGNOSIS — Z79899 Other long term (current) drug therapy: Secondary | ICD-10-CM | POA: Insufficient documentation

## 2022-10-19 DIAGNOSIS — E039 Hypothyroidism, unspecified: Secondary | ICD-10-CM | POA: Insufficient documentation

## 2022-10-19 DIAGNOSIS — E785 Hyperlipidemia, unspecified: Secondary | ICD-10-CM | POA: Insufficient documentation

## 2022-10-19 MED ORDER — SODIUM CHLORIDE 0.9 % IV SOLN: Freq: Once | INTRAVENOUS | Status: AC

## 2022-10-19 MED ORDER — DEXTROSE 5 % IV SOLN: Freq: Once | INTRAVENOUS | Status: AC

## 2022-10-19 MED ORDER — SODIUM CHLORIDE 0.9 % IV SOLN
240.0000 mg | Freq: Once | INTRAVENOUS | Status: AC
  Administered 2022-10-19: 240 mg via INTRAVENOUS
  Filled 2022-10-19: qty 24

## 2022-10-19 MED ORDER — FLUOROURACIL CHEMO INJECTION 2.5 GM/50ML
320.0000 mg/m2 | Freq: Once | INTRAVENOUS | Status: AC
  Administered 2022-10-19: 650 mg via INTRAVENOUS
  Filled 2022-10-19: qty 13

## 2022-10-19 MED ORDER — PALONOSETRON HCL INJECTION 0.25 MG/5ML
0.2500 mg | Freq: Once | INTRAVENOUS | Status: AC
  Administered 2022-10-19: 0.25 mg via INTRAVENOUS
  Filled 2022-10-19: qty 5

## 2022-10-19 MED ORDER — SODIUM CHLORIDE 0.9 % IV SOLN
1960.0000 mg/m2 | INTRAVENOUS | Status: DC
  Administered 2022-10-19: 3850 mg via INTRAVENOUS
  Filled 2022-10-19: qty 77

## 2022-10-19 MED ORDER — SODIUM CHLORIDE 0.9 % IV SOLN
10.0000 mg | Freq: Once | INTRAVENOUS | Status: AC
  Administered 2022-10-19: 10 mg via INTRAVENOUS
  Filled 2022-10-19: qty 10

## 2022-10-19 MED ORDER — LEUCOVORIN CALCIUM INJECTION 350 MG
320.0000 mg/m2 | Freq: Once | INTRAVENOUS | Status: AC
  Administered 2022-10-19: 630 mg via INTRAVENOUS
  Filled 2022-10-19: qty 31.5

## 2022-10-19 NOTE — Patient Instructions (Signed)
Nivolumab Injection What is this medication? NIVOLUMAB (nye VOL ue mab) treats some types of cancer. It works by helping your immune system slow or stop the spread of cancer cells. It is a monoclonal antibody. This medicine may be used for other purposes; ask your health care provider or pharmacist if you have questions. COMMON BRAND NAME(S): Opdivo What should I tell my care team before I take this medication? They need to know if you have any of these conditions: Allogeneic stem cell transplant (uses someone else's stem cells) Autoimmune diseases, such as Crohn disease, ulcerative colitis, lupus History of chest radiation Nervous system problems, such as Guillain-Barre syndrome or myasthenia gravis Organ transplant An unusual or allergic reaction to nivolumab, other medications, foods, dyes, or preservatives Pregnant or trying to get pregnant Breast-feeding How should I use this medication? This medication is infused into a vein. It is given in a hospital or clinic setting. A special MedGuide will be given to you before each treatment. Be sure to read this information carefully each time. Talk to your care team about the use of this medication in children. While it may be prescribed for children as young as 12 years for selected conditions, precautions do apply. Overdosage: If you think you have taken too much of this medicine contact a poison control center or emergency room at once. NOTE: This medicine is only for you. Do not share this medicine with others. What if I miss a dose? Keep appointments for follow-up doses. It is important not to miss your dose. Call your care team if you are unable to keep an appointment. What may interact with this medication? Interactions have not been studied. This list may not describe all possible interactions. Give your health care provider a list of all the medicines, herbs, non-prescription drugs, or dietary supplements you use. Also tell them if you  smoke, drink alcohol, or use illegal drugs. Some items may interact with your medicine. What should I watch for while using this medication? Your condition will be monitored carefully while you are receiving this medication. You may need blood work while taking this medication. This medication may cause serious skin reactions. They can happen weeks to months after starting the medication. Contact your care team right away if you notice fevers or flu-like symptoms with a rash. The rash may be red or purple and then turn into blisters or peeling of the skin. You may also notice a red rash with swelling of the face, lips, or lymph nodes in your neck or under your arms. Tell your care team right away if you have any change in your eyesight. Talk to your care team if you are pregnant or think you might be pregnant. A negative pregnancy test is required before starting this medication. A reliable form of contraception is recommended while taking this medication and for 5 months after the last dose. Talk to your care team about effective forms of contraception. Do not breast-feed while taking this medication and for 5 months after the last dose. What side effects may I notice from receiving this medication? Side effects that you should report to your care team as soon as possible: Allergic reactions--skin rash, itching, hives, swelling of the face, lips, tongue, or throat Dry cough, shortness of breath or trouble breathing Eye pain, redness, irritation, or discharge with blurry or decreased vision Heart muscle inflammation--unusual weakness or fatigue, shortness of breath, chest pain, fast or irregular heartbeat, dizziness, swelling of the ankles, feet, or hands Hormone  gland problems--headache, sensitivity to light, unusual weakness or fatigue, dizziness, fast or irregular heartbeat, increased sensitivity to cold or heat, excessive sweating, constipation, hair loss, increased thirst or amount of urine,  tremors or shaking, irritability Infusion reactions--chest pain, shortness of breath or trouble breathing, feeling faint or lightheaded Kidney injury (glomerulonephritis)--decrease in the amount of urine, red or dark brown urine, foamy or bubbly urine, swelling of the ankles, hands, or feet Liver injury--right upper belly pain, loss of appetite, nausea, light-colored stool, dark yellow or brown urine, yellowing skin or eyes, unusual weakness or fatigue Pain, tingling, or numbness in the hands or feet, muscle weakness, change in vision, confusion or trouble speaking, loss of balance or coordination, trouble walking, seizures Rash, fever, and swollen lymph nodes Redness, blistering, peeling, or loosening of the skin, including inside the mouth Sudden or severe stomach pain, bloody diarrhea, fever, nausea, vomiting Side effects that usually do not require medical attention (report these to your care team if they continue or are bothersome): Bone, joint, or muscle pain Diarrhea Fatigue Loss of appetite Nausea Skin rash This list may not describe all possible side effects. Call your doctor for medical advice about side effects. You may report side effects to FDA at 1-800-FDA-1088. Where should I keep my medication? This medication is given in a hospital or clinic. It will not be stored at home. NOTE: This sheet is a summary. It may not cover all possible information. If you have questions about this medicine, talk to your doctor, pharmacist, or health care provider.  2024 [employer] Standard (2021-06-01 00:00:00) Fluorouracil Injection What is this medication? FLUOROURACIL (flure oh YOOR a sil) treats some types of cancer. It works by slowing down the growth of cancer cells. This medicine may be used for other purposes; ask your health care provider or pharmacist if you have questions. COMMON BRAND NAME(S): Adrucil What should I tell my care team before I take this medication? They need to  know if you have any of these conditions: Blood disorders Dihydropyrimidine dehydrogenase (DPD) deficiency Infection, such as chickenpox, cold sores, herpes Kidney disease Liver disease Poor nutrition Recent or ongoing radiation therapy An unusual or allergic reaction to fluorouracil, other medications, foods, dyes, or preservatives If you or your partner are pregnant or trying to get pregnant Breast-feeding How should I use this medication? This medication is injected into a vein. It is administered by your care team in a hospital or clinic setting. Talk to your care team about the use of this medication in children. Special care may be needed. Overdosage: If you think you have taken too much of this medicine contact a poison control center or emergency room at once. NOTE: This medicine is only for you. Do not share this medicine with others. What if I miss a dose? Keep appointments for follow-up doses. It is important not to miss your dose. Call your care team if you are unable to keep an appointment. What may interact with this medication? Do not take this medication with any of the following: Live virus vaccines This medication may also interact with the following: Medications that treat or prevent blood clots, such as warfarin, enoxaparin, dalteparin This list may not describe all possible interactions. Give your health care provider a list of all the medicines, herbs, non-prescription drugs, or dietary supplements you use. Also tell them if you smoke, drink alcohol, or use illegal drugs. Some items may interact with your medicine. What should I watch for while using this medication? Your condition  will be monitored carefully while you are receiving this medication. This medication may make you feel generally unwell. This is not uncommon as chemotherapy can affect healthy cells as well as cancer cells. Report any side effects. Continue your course of treatment even though you feel ill  unless your care team tells you to stop. In some cases, you may be given additional medications to help with side effects. Follow all directions for their use. This medication may increase your risk of getting an infection. Call your care team for advice if you get a fever, chills, sore throat, or other symptoms of a cold or flu. Do not treat yourself. Try to avoid being around people who are sick. This medication may increase your risk to bruise or bleed. Call your care team if you notice any unusual bleeding. Be careful brushing or flossing your teeth or using a toothpick because you may get an infection or bleed more easily. If you have any dental work done, tell your dentist you are receiving this medication. Avoid taking medications that contain aspirin, acetaminophen, ibuprofen, naproxen, or ketoprofen unless instructed by your care team. These medications may hide a fever. Do not treat diarrhea with over the counter products. Contact your care team if you have diarrhea that lasts more than 2 days or if it is severe and watery. This medication can make you more sensitive to the sun. Keep out of the sun. If you cannot avoid being in the sun, wear protective clothing and sunscreen. Do not use sun lamps, tanning beds, or tanning booths. Talk to your care team if you or your partner wish to become pregnant or think you might be pregnant. This medication can cause serious birth defects if taken during pregnancy and for 3 months after the last dose. A reliable form of contraception is recommended while taking this medication and for 3 months after the last dose. Talk to your care team about effective forms of contraception. Do not father a child while taking this medication and for 3 months after the last dose. Use a condom while having sex during this time period. Do not breastfeed while taking this medication. This medication may cause infertility. Talk to your care team if you are concerned about your  fertility. What side effects may I notice from receiving this medication? Side effects that you should report to your care team as soon as possible: Allergic reactions--skin rash, itching, hives, swelling of the face, lips, tongue, or throat Heart attack--pain or tightness in the chest, shoulders, arms, or jaw, nausea, shortness of breath, cold or clammy skin, feeling faint or lightheaded Heart failure--shortness of breath, swelling of the ankles, feet, or hands, sudden weight gain, unusual weakness or fatigue Heart rhythm changes--fast or irregular heartbeat, dizziness, feeling faint or lightheaded, chest pain, trouble breathing High ammonia level--unusual weakness or fatigue, confusion, loss of appetite, nausea, vomiting, seizures Infection--fever, chills, cough, sore throat, wounds that don't heal, pain or trouble when passing urine, general feeling of discomfort or being unwell Low red blood cell level--unusual weakness or fatigue, dizziness, headache, trouble breathing Pain, tingling, or numbness in the hands or feet, muscle weakness, change in vision, confusion or trouble speaking, loss of balance or coordination, trouble walking, seizures Redness, swelling, and blistering of the skin over hands and feet Severe or prolonged diarrhea Unusual bruising or bleeding Side effects that usually do not require medical attention (report to your care team if they continue or are bothersome): Dry skin Headache Increased tears Nausea Pain,  redness, or swelling with sores inside the mouth or throat Sensitivity to light Vomiting This list may not describe all possible side effects. Call your doctor for medical advice about side effects. You may report side effects to FDA at 1-800-FDA-1088. Where should I keep my medication? This medication is given in a hospital or clinic. It will not be stored at home. NOTE: This sheet is a summary. It may not cover all possible information. If you have questions  about this medicine, talk to your doctor, pharmacist, or health care provider.  2024 [employer]/Gold Standard (2021-06-09 00:00:00)

## 2022-10-21 ENCOUNTER — Inpatient Hospital Stay: Payer: Medicare Other

## 2022-10-21 VITALS — BP 151/78 | HR 81 | Temp 98.1°F | Resp 18 | Ht 60.0 in | Wt 204.0 lb

## 2022-10-21 DIAGNOSIS — N182 Chronic kidney disease, stage 2 (mild): Secondary | ICD-10-CM | POA: Diagnosis not present

## 2022-10-21 DIAGNOSIS — E039 Hypothyroidism, unspecified: Secondary | ICD-10-CM | POA: Diagnosis not present

## 2022-10-21 DIAGNOSIS — C168 Malignant neoplasm of overlapping sites of stomach: Secondary | ICD-10-CM

## 2022-10-21 DIAGNOSIS — Z5111 Encounter for antineoplastic chemotherapy: Secondary | ICD-10-CM | POA: Diagnosis not present

## 2022-10-21 DIAGNOSIS — I129 Hypertensive chronic kidney disease with stage 1 through stage 4 chronic kidney disease, or unspecified chronic kidney disease: Secondary | ICD-10-CM | POA: Diagnosis not present

## 2022-10-21 DIAGNOSIS — C169 Malignant neoplasm of stomach, unspecified: Secondary | ICD-10-CM | POA: Diagnosis not present

## 2022-10-21 DIAGNOSIS — C786 Secondary malignant neoplasm of retroperitoneum and peritoneum: Secondary | ICD-10-CM | POA: Diagnosis not present

## 2022-10-21 MED ORDER — SODIUM CHLORIDE 0.9 % IV SOLN: Freq: Once | INTRAVENOUS | Status: AC

## 2022-10-21 MED ORDER — SODIUM CHLORIDE 0.9% FLUSH: 10.0000 mL | INTRAVENOUS | Status: DC | PRN

## 2022-10-21 MED ORDER — HEPARIN SOD (PORK) LOCK FLUSH 100 UNIT/ML IV SOLN: 500.0000 [IU] | Freq: Once | INTRAVENOUS | Status: DC | PRN

## 2022-10-21 NOTE — Patient Instructions (Signed)

## 2022-10-28 ENCOUNTER — Other Ambulatory Visit: Payer: Self-pay | Admitting: Oncology

## 2022-10-28 ENCOUNTER — Inpatient Hospital Stay: Payer: Medicare Other | Admitting: Oncology

## 2022-10-28 ENCOUNTER — Inpatient Hospital Stay: Payer: Medicare Other

## 2022-10-28 ENCOUNTER — Encounter: Payer: Self-pay | Admitting: Oncology

## 2022-10-28 VITALS — BP 134/71 | HR 93 | Temp 97.7°F | Resp 18 | Ht 60.0 in | Wt 201.0 lb

## 2022-10-28 DIAGNOSIS — E039 Hypothyroidism, unspecified: Secondary | ICD-10-CM | POA: Diagnosis not present

## 2022-10-28 DIAGNOSIS — Z5111 Encounter for antineoplastic chemotherapy: Secondary | ICD-10-CM | POA: Diagnosis not present

## 2022-10-28 DIAGNOSIS — N142 Nephropathy induced by unspecified drug, medicament or biological substance: Secondary | ICD-10-CM

## 2022-10-28 DIAGNOSIS — C168 Malignant neoplasm of overlapping sites of stomach: Secondary | ICD-10-CM

## 2022-10-28 DIAGNOSIS — N182 Chronic kidney disease, stage 2 (mild): Secondary | ICD-10-CM | POA: Diagnosis not present

## 2022-10-28 DIAGNOSIS — I129 Hypertensive chronic kidney disease with stage 1 through stage 4 chronic kidney disease, or unspecified chronic kidney disease: Secondary | ICD-10-CM | POA: Diagnosis not present

## 2022-10-28 DIAGNOSIS — D649 Anemia, unspecified: Secondary | ICD-10-CM | POA: Diagnosis not present

## 2022-10-28 DIAGNOSIS — C169 Malignant neoplasm of stomach, unspecified: Secondary | ICD-10-CM | POA: Diagnosis not present

## 2022-10-28 DIAGNOSIS — C786 Secondary malignant neoplasm of retroperitoneum and peritoneum: Secondary | ICD-10-CM | POA: Diagnosis not present

## 2022-10-28 LAB — HEPATIC FUNCTION PANEL
ALT: 19 U/L (ref 7–35)
AST: 26 (ref 13–35)
Alkaline Phosphatase: 69 (ref 25–125)
Bilirubin, Total: 0.7

## 2022-10-28 LAB — BASIC METABOLIC PANEL
BUN: 20 (ref 4–21)
CO2: 24 — AB (ref 13–22)
Chloride: 105 (ref 99–108)
Creatinine: 1.2 — AB (ref 0.5–1.1)
EGFR: 43
Glucose: 122
Potassium: 4.2 meq/L (ref 3.5–5.1)
Sodium: 135 — AB (ref 137–147)

## 2022-10-28 LAB — COMPREHENSIVE METABOLIC PANEL (ASHBORO CC SCANNED REPORT)

## 2022-10-28 LAB — CBC W DIFFERENTIAL (~~LOC~~ CC SCANNED REPORT)

## 2022-10-28 LAB — TSH: TSH: 1.563 u[IU]/mL (ref 0.350–4.500)

## 2022-10-28 LAB — CBC: RBC: 4.07 (ref 3.87–5.11)

## 2022-10-28 LAB — CBC AND DIFFERENTIAL
HCT: 37 (ref 36–46)
Hemoglobin: 12.6 (ref 12.0–16.0)
Neutrophils Absolute: 5.37
Platelets: 211 10*3/uL (ref 150–400)
WBC: 9.1

## 2022-10-28 LAB — COMPREHENSIVE METABOLIC PANEL
Albumin: 4.3 (ref 3.5–5.0)
Calcium: 9.6 (ref 8.7–10.7)

## 2022-10-28 NOTE — Progress Notes (Signed)
[facility]  [address] [place],  [place]  [address] [phone]  Clinic Day: 10/28/22  Referring physician: Dellia Beckwith, MD  ASSESSMENT & PLAN:  Gastric adenocarcinoma This is a poorly differentiated adenocarcinoma with signet ring features and ulceration. Stain for HER2 was negative at 0. I would consider it as metastatic to the omentum and so that would stage this as a T4 N0 M1, stage IVB.  She has been on treatment with chemotherapy with FOLFOX and nivolumab for 10 cycles with very good response, but then had increasing dermatologic toxicities and neuropathy. Her PET scan from 03/22/2022 shows a new area of hypermetabolic activity in the right lower lobe of peripheral consolidation, which is likely atelectasis or resolving pneumonia measuring 3cm. The thickening of her stomach has decreased with decreased metabolic activity and there is shrinkage of the omental metastasis from 22 mm to 13 mm. She had a severe rash and fever and was admitted to the hospital, and she was also found to have pneumonia and a UTI at that time. We are not sure which medication caused her rashes. Dr. Jennye Boroughs did a EGD on March 5th and found persistent poorly differentiated adenocarcinoma and a 3 cm mass of the distal body of the stomach. Since her current treatment is palliating her disease, we will continue. She also had a CT chest, abdomen, and pelvis on 08/30/2022 that revealed mild persistent gastric wall thickening with subtle adjacent omental nodularity, consistent with treated gastric malignancy,    Immune Mediated Nephritis Her creatinine is improved with normal saline IV and she was encouraged to drink more fluids. Renal ultrasound was normal. We felt this was caused by her immunotherapy and placed her on Prednisone for a short course and low dose, which was effective. I tapered her off completely as of the end of April. Her creatinine went up a little bit last  time to 1.27 and then 1.5 two weeks ago. I therefore gave her IV fluids and rechecked last week and it was down to 1.2 and remains stable today at 1.2. We will continue IV fluids with her chemotherapy treatments.     Plan: Her day 1 cycle 23 of FOLFOX and Nivolumab is scheduled on 11/01/2022 and her day 3 pump D/C, cycle 23 is 11/03/2022. I okayed her to go to the dentist, I will prescribe Ampicillin for her to take before dental work. Her last CT scan was on 07/15//2024 that revealed mild persistent gastric wall thickening with subtle adjacent omental nodularity, consistent with treated gastric malignancy, no evidence of lymphadenopathy or distant metastatic disease in the chest, abdomen, or pelvis, so she won't be due for repeat scans until around the 21st of October. Her WBC is 9.1, hemoglobin is 12.6, and platelet count is 211,000 as of today. She has a low sodium of 135 and a elevated creatinine of 1.20 with a BUN of 20. She continues oral potassium supplement twice daily. [name] back in 2 weeks CBC, CMP, TSH, and T4. I reviewed all of this with her and her daughter and answered their questions. She understands and agrees with this plan    I provided 16 minutes of face-to-face time during this this encounter and > 50% was spent counseling as documented under my assessment and plan.   Dellia Beckwith, MD [facility] [address] [place] [address] Dept: [phone] Dept Fax: [phone]     CHIEF  COMPLAINT:  CC: Gastric cancer  Current Treatment: Chemotherapy/immunotherapy  HISTORY OF PRESENT ILLNESS:  Mackenzie Lewis is a 87 y.o. female with a history of gastric cancer who is referred in consultation with Dr. Shara Blazing for assessment and management.  She had noticed that she was having regurgitation when eating and had lost over 30 pounds.  An ultrasound was done, revealing hepatic steatosis, and led to  an MRI scan on June 5 which revealed gastric wall thickening with confluent nodularity of the omentum anterior to the stomach measuring 3.5 cm consistent with metastatic tumor.  She also had low-grade edema and wall thickening extending into the duodenum from the stomach.  She was referred to Dr. Shara Blazing and he did an EGD on July 7.  This revealed a large ulceration measuring 1.2 cm along the greater curvature.  She also had diffusely edematous and erythematous wall with erosions of the antrum and stiff and friable mucosa with oozing of blood.  These findings extended to the gastric fundus as well.  Pathology revealed a poorly differentiated adenocarcinoma with signet ring features from the biopsies of the ulcer as well as the antrum and the fundus of the stomach.  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  She was placed on omeprazole.  Her test for H. pylori was negative.  She was referred to Dr. Hardin Negus for consideration of surgery but he felt this was not resectable because of the extensive involvement and I agree.  I would consider this extending to the duodenum and also metastatic to the omentum.  PET scan confirmed these findings and she wished to pursue systemic intravenous therapy.  She continues to eat and has adjusted her diet to softer foods and liquids and is drinking boost so she has maintained her weight.  She does have some Zofran ODT for nausea when needed.  She has had some anorexia, nausea, occasional vomiting, early satiety, dysphagia, and belching.  A CEA and CA 19-9 were normal. She has been started on FOLFOX chemotherapy along with immunotherapy.  I have reviewed her chart and materials related to her cancer extensively and collaborated history with the patient. Summary of oncologic history is as follows: Oncology History  Gastric cancer (HCC)  09/10/2021 Initial Diagnosis   Gastric cancer (HCC)   09/10/2021 Cancer Staging    Staging form: Stomach, AJCC 8th Edition - Clinical stage from 09/10/2021: Stage IVB (cT4b, cN0, cM1) - Signed by Dellia Beckwith, MD on 09/10/2021 Histopathologic type: Adenocarcinoma, NOS Stage prefix: Initial diagnosis Total positive nodes: 0 Histologic grade (G): G3 Histologic grading system: 3 grade system Sites of metastasis: Peritoneal surface Diagnostic confirmation: Positive histology PLUS positive immunophenotyping and/or positive genetic studies Specimen type: Endoscopy with Biopsy Staged by: Managing physician Carcinoembryonic antigen (CEA) (ng/mL): 2.8 Carbohydrate antigen 19-9 (CA 19-9) (U/mL): 4.9 HER2 status: Unknown Microsatellite instability (MSI): Unknown Tumor location in stomach: Other Clinical staging modalities: Biopsy, Endoscopy Stage used in treatment planning: Yes National guidelines used in treatment planning: Yes Type of national guideline used in treatment planning: NCCN   09/28/2021 - 10/14/2021 Chemotherapy   Patient is on Treatment Plan : GASTROESOPHAGEAL FOLFOX + Nivolumab q14d     09/28/2021 -  Chemotherapy   Patient is on Treatment Plan : GASTROESOPHAGEAL FOLFOX + Nivolumab q14d     12/09/2021 Genetic Testing   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  Report date is 12/09/2021.   The Multi-Cancer + RNA  Panel offered by [facility] includes sequencing and/or deletion/duplication analysis of the following 84 genes:  AIP*, ALK, APC*, ATM*, AXIN2*, BAP1*, BARD1*, BLM*, BMPR1A*, BRCA1*, BRCA2*, BRIP1*, CASR, CDC73*, CDH1*, CDK4, CDKN1B*, CDKN1C*, CDKN2A, CEBPA, CHEK2*, CTNNA1*, DICER1*, DIS3L2*, EGFR, EPCAM, FH*, FLCN*, GATA2*, GPC3, GREM1, HOXB13, HRAS, KIT, MAX*, MEN1*, MET, MITF, MLH1*, MSH2*, MSH3*, MSH6*, MUTYH*, NBN*, NF1*, NF2*, NTHL1*, PALB2*, PDGFRA, PHOX2B, PMS2*, POLD1*, POLE*, POT1*, PRKAR1A*, PTCH1*, PTEN*, RAD50*, RAD51C*, RAD51D*, RB1*, RECQL4, RET, RUNX1*, SDHA*, SDHAF2*, SDHB*, SDHC*, SDHD*,  SMAD4*, SMARCA4*, SMARCB1*, SMARCE1*, STK11*, SUFU*, TERC, TERT, TMEM127*, Tp53*, TSC1*, TSC2*, VHL*, WRN*, and WT1.  RNA analysis is performed for * genes.     INTERVAL HISTORY:  Mackenzie Lewis is here today for a follow-up for her gastric cancer, she continues maintenance treatment with 5FU, Leucovorin, and Nivolumab. Patient states that she feels well and complains of her chronic arm pain that is improved but worse when laying down at bedtime. Her day 1 cycle 23 of FOLFOX and Nivolumab is scheduled on 11/01/2022 and her day 3 pump D/C, cycle 23 is 11/03/2022. I okayed her to go to the dentist, I will prescribe Ampicillin for her to take before dental work. Her last CT scan was on 07/15//2024 that revealed mild persistent gastric wall thickening with subtle adjacent omental nodularity, consistent with treated gastric malignancy, no evidence of lymphadenopathy or distant metastatic disease in the chest, abdomen, or pelvis, so she won't be due for repeat scans until around the 21st of October. Her WBC is 9.1, hemoglobin is 12.6, and platelet count is 211,000 as of today. She has a low sodium of 135 and a elevated creatinine of 1.20 with a BUN of 20. She continues oral potassium supplement twice daily. [name] back in 2 weeks CBC, CMP, TSH, and T4.   She denies signs of infection such as sore throat, sinus drainage, cough, or urinary symptoms.  She denies fevers or recurrent chills. She denies pain. She denies nausea, vomiting, chest pain, dyspnea or cough. Her appetite is good and her weight has decreased 3 pounds over last week . 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  If that happens, she is to call us so that we can schedule her for an ultrasound to look for synovitis.     Essential hypertension 09/09/2016   Gastric cancer (HCC) 09/10/2021   Hyperlipidemia 09/01/2016   Hypertension    Hypothyroidism 09/01/2016   Osteoarthritis of both feet 01/20/2016   Osteoarthritis, hand 01/20/2016   Thyroid disease   Degenerative disc disease History of endometriosis  Past Surgical History:  Procedure Laterality Date   APPENDECTOMY     LAPAROSCOPIC APPENDECTOMY N/A 04/10/2016   Procedure: APPENDECTOMY LAPAROSCOPIC;  Surgeon: Abigail Miyamoto, MD;  Location: [facility] OR;  Service: General;  Laterality: N/A;  Bilateral tubal ligation Total hysterectomy and bilateral salpingo-oophorectomy in 1980  Family History  Problem Relation Age of Onset   Hypertension  Mother    Prostate cancer Father        metastatic; d. 73   Brain cancer Sister 46   Breast cancer Sister 66   AAA (abdominal aortic aneurysm) Brother    Leukemia Cousin        x2 maternal female cousins; d. before 65   Breast cancer Daughter 40       DCIS  Her sister has had breast cancer as well as a tumor of her head Her daughter has had breast cancer last year  Social History:  reports that she has never smoked. She has never used smokeless tobacco. She reports that she does not currently use alcohol. She reports that she does not use drugs.The patient is accompanied by her son, daughter-in-law and daughter today.  She is single but lives with a significant other.  She has the 2 children.  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  She grew up in [place] and [place].  She is active and healthy, especially for her age.  Allergies:   Allergies  Allergen Reactions   Doxycycline Rash   Sulfa Antibiotics Rash and Other (See Comments)    Other reaction(s): Other (See Comments)  "Made me feel weird"    Current Medications: Current Outpatient Medications  Medication Sig Dispense Refill   amoxicillin (AMOXIL) 500 MG capsule Take 1,000 mg by mouth 2 (two) times daily. (Patient not taking: Reported on 11/10/2022)     aspirin 81 MG EC tablet Take 81 mg by mouth daily. Swallow whole.     b complex vitamins capsule Take 1 capsule by mouth daily.     Calcium Carbonate (CALCIUM 600 PO) Take 1 tablet by mouth daily.     Cholecalciferol (VITAMIN D3) 5000 units CAPS Take 1 capsule by mouth daily.     EXFORGE HCT 5-160-12.5 MG TABS Take 1 tablet by mouth daily.     famotidine (PEPCID) 40 MG tablet Take 40 mg by mouth at bedtime.     KRILL OIL PO Take 1 capsule by mouth daily. Unknown strenght     Lactobacillus TABS Take 1 tablet by mouth 2 (two) times daily.     levothyroxine (SYNTHROID, LEVOTHROID) 75 MCG tablet Take 75 mcg by mouth daily before breakfast.      NON FORMULARY MMW: 3 parts Maalox 2 parts Benadryl 1 part viscious lidicaine  Disp. 6oz  Instructions: 5ml swish and swallow every 3-4 hours     omeprazole (PRILOSEC) 40 MG capsule Take 1 capsule (40 mg total) by mouth 2 (two) times daily. 60 capsule 5   ondansetron (ZOFRAN-ODT) 4 MG disintegrating tablet Take 1 tablet (4 mg total) by mouth every 8 (eight) hours as needed for nausea or vomiting. 90 tablet 0   polyethylene glycol powder (GLYCOLAX/MIRALAX) 17 GM/SCOOP powder SMARTSIG:1 scoopful By Mouth Daily     potassium chloride SA (KLOR-CON M) 20 MEQ tablet TAKE ONE TABLET BY MOUTH THREE TIMES DAILY (Patient taking differently: Take 20 mEq by mouth 3 (three) times daily. Patient reports taking one tablet twice daily) 90 tablet 5   pravastatin (PRAVACHOL) 20 MG tablet Take 1 tablet (20 mg total) by mouth every evening. 90 tablet 3   Probiotic Product (PROBIOTIC DAILY  PO) Take 1 tablet by mouth daily.     prochlorperazine (COMPAZINE) 10 MG tablet Take 1 tablet (10 mg total) by mouth every 6 (six) hours as needed for nausea or vomiting. 90 tablet 3   triamcinolone 0.1% oint-Eucerin equivalent cream  1:1 mixture Apply topically 2 (two) times daily. 160 g 0   triamcinolone cream (KENALOG) 0.1 % Apply to affected skin twice daily (Patient taking differently: daily as needed. Apply to affected skin twice daily) 160 g 0   No current facility-administered medications for this visit.   REVIEW OF SYSTEMS:  Review of Systems  Constitutional: Negative.  Negative for appetite change, chills, diaphoresis, fatigue, fever and unexpected weight change.  HENT:   Negative for hearing loss, lump/mass, mouth sores, nosebleeds, sore throat, tinnitus, trouble swallowing and voice change.   Eyes:  Negative for eye problems and icterus.  Respiratory: Negative.  Negative for chest tightness, cough, hemoptysis, shortness of breath and wheezing.   Cardiovascular: Negative.  Negative for chest pain, leg swelling and palpitations.  Gastrointestinal:  Positive for constipation. Negative for abdominal distention, abdominal pain, blood in stool, diarrhea, nausea, rectal pain and vomiting.  Endocrine: Negative.  Negative for hot flashes.  Genitourinary: Negative.  Negative for bladder incontinence, difficulty urinating, dyspareunia, dysuria, frequency, hematuria, menstrual problem, nocturia, pelvic pain, vaginal bleeding and vaginal discharge.   Musculoskeletal: Negative.  Negative for arthralgias, back pain, flank pain, gait problem, myalgias, neck pain and neck stiffness.       Left shoulder pain in the scapula area rating 5/10. (Constant and burning)  Skin: Negative.  Negative for itching, rash and wound.  Neurological:  Positive for numbness (in her fingers and toes). Negative for dizziness, extremity weakness, gait problem, headaches, light-headedness, seizures and speech difficulty.   Hematological:  Negative for adenopathy. Bruises/bleeds easily.  Psychiatric/Behavioral: Negative.  Negative for confusion, decreased concentration, depression, sleep disturbance and suicidal ideas. The patient is not nervous/anxious.    VITALS:  Blood pressure 134/71, pulse 93, temperature 97.7 F (36.5 C), temperature source Oral, resp. rate 18, height 5' (1.524 m), weight 201 lb (91.2 kg), SpO2 98%.  Wt Readings from Last 3 Encounters:  11/17/22 204 lb 4 oz (92.6 kg)  11/10/22 200 lb (90.7 kg)  11/03/22 205 lb (93 kg)    Body mass index is 39.26 kg/m.  Performance status (ECOG): 1 - Symptomatic but completely ambulatory  PHYSICAL EXAM:  Physical Exam Vitals and nursing note reviewed. Exam conducted with a chaperone present.  Constitutional:      General: She is not in acute distress.    Appearance: Normal appearance. She is normal weight. She is not ill-appearing, toxic-appearing or diaphoretic.  HENT:     Head: Normocephalic and atraumatic.     Right Ear: Tympanic membrane, ear canal and external ear normal. There is no impacted cerumen.     Left Ear: Tympanic membrane, ear canal and external ear normal. There is no impacted cerumen.     Nose: Nose normal. No congestion or rhinorrhea.     Mouth/Throat:     Mouth: Mucous membranes are moist.     Pharynx: Oropharynx is clear. No oropharyngeal exudate or posterior oropharyngeal erythema.  Eyes:     General: No scleral icterus.       Right eye: No discharge.        Left eye: No discharge.     Extraocular Movements: Extraocular movements intact.     Conjunctiva/sclera: Conjunctivae normal.     Pupils: Pupils are equal, round, and reactive to light.  Neck:     Vascular: No carotid bruit.  Cardiovascular:     Rate and Rhythm: Normal rate and regular rhythm.     Pulses: Normal pulses.     Heart sounds: Normal heart sounds. No  murmur heard.    No friction rub. No gallop.  Pulmonary:     Effort: Pulmonary effort is normal. No  respiratory distress.     Breath sounds: Normal breath sounds. No stridor. No wheezing, rhonchi or rales.  Chest:     Chest wall: No tenderness.  Abdominal:     General: Bowel sounds are normal. There is no distension.     Palpations: Abdomen is soft. There is no hepatomegaly, splenomegaly or mass.     Tenderness: There is no abdominal tenderness. There is no right CVA tenderness, left CVA tenderness, guarding or rebound.     Hernia: No hernia is present.  Musculoskeletal:        General: No swelling, tenderness, deformity or signs of injury.     Left shoulder: Normal range of motion.     Cervical back: Normal range of motion and neck supple. No rigidity or tenderness.     Right lower leg: No edema.     Left lower leg: No edema.  Lymphadenopathy:     Cervical: No cervical adenopathy.     Right cervical: No superficial, deep or posterior cervical adenopathy.    Left cervical: No superficial, deep or posterior cervical adenopathy.     Upper Body:     Right upper body: No supraclavicular, axillary or pectoral adenopathy.     Left upper body: No supraclavicular, axillary or pectoral adenopathy.  Skin:    General: Skin is warm and dry.     Coloration: Skin is not jaundiced or pale.     Findings: No bruising, erythema, lesion or rash.  Neurological:     General: No focal deficit present.     Mental Status: She is alert and oriented to person, place, and time. Mental status is at baseline.     Cranial Nerves: No cranial nerve deficit.     Sensory: No sensory deficit.     Motor: No weakness.     Coordination: Coordination normal.     Gait: Gait normal.     Deep Tendon Reflexes: Reflexes normal.  Psychiatric:        Mood and Affect: Mood normal.        Behavior: Behavior normal.        Thought Content: Thought content normal.        Judgment: Judgment normal.    LABS:      Latest Ref Rng & Units 11/10/2022    9:34 AM 10/28/2022   12:00 AM 10/14/2022   10:32 AM  CBC  WBC 4.0 -  10.5 K/uL 6.5  9.1     8.0      Hemoglobin 12.0 - 15.0 g/dL 19.1  47.8     29.5      Hematocrit 36.0 - 46.0 % 37.5  37     35      Platelets 150 - 400 K/uL 185  211     216         This result is from an external source.      Latest Ref Rng & Units 11/10/2022    9:34 AM 10/28/2022   12:00 AM 10/14/2022   10:32 AM  CMP  Glucose 70 - 99 mg/dL 621     BUN 8 - 23 mg/dL 20  20     19       Creatinine 0.44 - 1.00 mg/dL 3.08  1.2     1.3      Sodium 135 - 145 mmol/L 137  135  138      Potassium 3.5 - 5.1 mmol/L 3.6  4.2     4.1      Chloride 98 - 111 mmol/L 103  105     103      CO2 22 - 32 mmol/L 23  24     22       Calcium 8.9 - 10.3 mg/dL 9.4  9.6     9.9      Total Protein 6.5 - 8.1 g/dL 7.1     Total Bilirubin 0.3 - 1.2 mg/dL 1.0     Alkaline Phos 38 - 126 U/L 67  69     69      AST 15 - 41 U/L 21  26     25       ALT 0 - 44 U/L 17  19     19          This result is from an external source.   Lab Results  Component Value Date   CEA1 2.9 09/10/2021   /  CEA  Date Value Ref Range Status  09/10/2021 2.9 0.0 - 4.7 ng/mL Final    Comment:    (NOTE)                             Nonsmokers          <3.9                             Smokers             <5.6 [employer] Electrochemiluminescence Immunoassay (ECLIA) Values obtained with different assay methods or kits cannot be used interchangeably.  Results cannot be interpreted as absolute evidence of the presence or absence of malignant disease. Performed At: [facility] [address] [place], [place] [id number] Jolene Schimke MD WF:[id number]             Component Ref Range & Units 2 wk ago (10/14/22) 4 wk ago (09/29/22) 1 mo ago (09/16/22) 1 mo ago (08/30/22) 2 mo ago (08/23/22) 2 mo ago (08/05/22) 3 mo ago (07/22/22)  TSH 0.350 - 4.500 uIU/mL 1.442 1.113 CM 1.869 CM 1.46 R 1.953 CM 2.443 CM 2.035 CM     Component Ref Range & Units 2 wk ago (10/14/22) 4 wk ago (09/29/22) 1 mo ago (09/16/22) 2 mo ago (08/23/22)  2 mo ago (08/05/22) 3 mo ago (07/22/22) 3 mo ago (07/08/22)  T4, Total 4.5 - 12.0 ug/dL 20.2 54.2 CM 70.6 High  CM 10.7 CM 11.5 CM 11.3 CM 10.5 CM    Component Ref Range & Units 06/10/22  Vitamin B-12 180 - 914 pg/mL 302   Component Ref Range & Units 06/10/22 2 yr ago 3 yr ago  Cholesterol 0 - 200 mg/dL 237    Triglycerides <628 mg/dL 315 176 R 160 High  R  HDL >40 mg/dL 77 64 R 62 R  Total CHOL/HDL Ratio RATIO 2.3 2.8 R, CM 3.3 R, CM  VLDL 0 - 40 mg/dL 29    LDL Cholesterol 0 - 99 mg/dL 70     No results found for: "PSA1" No results found for: "VPX106" No results found for: "CAN125"  No results found for: "TOTALPROTELP", "ALBUMINELP", "A1GS", "A2GS", "BETS", "BETA2SER", "GAMS", "MSPIKE", "SPEI" No results found for: "TIBC", "FERRITIN", "IRONPCTSAT" No results found for: "LDH"  STUDIES:  Exam: 08/30/2022 CT Chest, Abdomen,  and Pelvis Impression: Mild, persistent gastric wall thickening with subtle adjacent omental nodularity, consistent with treated gastric malignancy. No evidence of lymphadenopathy or distant metastatic disease in the chest, abdomen, or pelvis.  Unchanged, asymmetric fibrosis and bronchiectasis of the right lung base, most likely related to infection or aspiration given asymmetric distribution. Attention on follow-up. Aortic Atherosclerosis (ICD10-170.0)  Exam: 08/23/2022 Left Shoulder- 2+ View Impression Degenerative changes of the left shoulder. No acute or destructive bony abnormality.  Echocardiogram: 07/02/2022 IMPRESSIONS:  1. Left ventricular ejection fraction, by estimation, is 60 to 65%. The  left ventricle has normal function. The left ventricle has no regional  wall motion abnormalities. Left ventricular diastolic parameters are  consistent with Grade I diastolic  dysfunction (impaired relaxation).GLS-18.3%   2. Right ventricular systolic function is normal. The right ventricular  size is normal. There is normal pulmonary artery systolic  pressure.   3. Left atrial size was mildly dilated.   4. The mitral valve is normal in structure. Moderate mitral valve  regurgitation. No evidence of mitral stenosis.   5. The aortic valve is normal in structure. Aortic valve regurgitation is  not visualized. No aortic stenosis is present.   6. The inferior vena cava is normal in size with greater than 50%  respiratory variability, suggesting right atrial pressure of 3 mmHg.    EXAM: 03/22/2022 NUCLEAR MEDICINE PET SKULL BASE TO THIGH IMPRESSION: 1. Resolution of metabolic activity associated with the stomach. 2. Decrease in size of omental nodularity. No associated metabolic activity. 3. No evidence of new or progressive gastric carcinoma. 4. New peripheral consolidation in the RIGHT lower lobe with moderate metabolic activity. Favor focus of atelectasis versus less likely pneumonia. 5.  Aortic Atherosclerosis (ICD10-I70.0).    I,Jasmine M Lassiter,acting as a scribe for Dellia Beckwith, MD.,have documented all relevant documentation on the behalf of Dellia Beckwith, MD,as directed by  Dellia Beckwith, MD while in the presence of Dellia Beckwith, MD.

## 2022-10-29 LAB — T4: T4, Total: 9.8 ug/dL (ref 4.5–12.0)

## 2022-10-29 MED FILL — Leucovorin Calcium For Inj 350 MG: INTRAMUSCULAR | Qty: 31.5 | Status: AC

## 2022-10-29 MED FILL — Dexamethasone Sodium Phosphate Inj 100 MG/10ML: INTRAMUSCULAR | Qty: 1 | Status: AC

## 2022-10-29 MED FILL — Nivolumab IV Soln 240 MG/24ML: INTRAVENOUS | Qty: 24 | Status: AC

## 2022-10-29 MED FILL — Fluorouracil IV Soln 2.5 GM/50ML (50 MG/ML): INTRAVENOUS | Qty: 13 | Status: AC

## 2022-10-29 MED FILL — Fluorouracil IV Soln 5 GM/100ML (50 MG/ML): INTRAVENOUS | Qty: 77 | Status: AC

## 2022-10-30 ENCOUNTER — Other Ambulatory Visit: Payer: Self-pay

## 2022-11-01 ENCOUNTER — Inpatient Hospital Stay: Payer: Medicare Other

## 2022-11-01 VITALS — BP 175/73 | HR 92 | Temp 98.7°F | Resp 14 | Ht 60.0 in | Wt 202.4 lb

## 2022-11-01 DIAGNOSIS — I129 Hypertensive chronic kidney disease with stage 1 through stage 4 chronic kidney disease, or unspecified chronic kidney disease: Secondary | ICD-10-CM | POA: Diagnosis not present

## 2022-11-01 DIAGNOSIS — C168 Malignant neoplasm of overlapping sites of stomach: Secondary | ICD-10-CM

## 2022-11-01 DIAGNOSIS — N182 Chronic kidney disease, stage 2 (mild): Secondary | ICD-10-CM | POA: Diagnosis not present

## 2022-11-01 DIAGNOSIS — Z5111 Encounter for antineoplastic chemotherapy: Secondary | ICD-10-CM | POA: Diagnosis not present

## 2022-11-01 DIAGNOSIS — C169 Malignant neoplasm of stomach, unspecified: Secondary | ICD-10-CM | POA: Diagnosis not present

## 2022-11-01 DIAGNOSIS — C786 Secondary malignant neoplasm of retroperitoneum and peritoneum: Secondary | ICD-10-CM | POA: Diagnosis not present

## 2022-11-01 DIAGNOSIS — E039 Hypothyroidism, unspecified: Secondary | ICD-10-CM | POA: Diagnosis not present

## 2022-11-01 MED ORDER — SODIUM CHLORIDE 0.9 % IV SOLN
240.0000 mg | Freq: Once | INTRAVENOUS | Status: AC
  Administered 2022-11-01: 240 mg via INTRAVENOUS
  Filled 2022-11-01: qty 24

## 2022-11-01 MED ORDER — PALONOSETRON HCL INJECTION 0.25 MG/5ML
0.2500 mg | Freq: Once | INTRAVENOUS | Status: AC
  Administered 2022-11-01: 0.25 mg via INTRAVENOUS
  Filled 2022-11-01: qty 5

## 2022-11-01 MED ORDER — SODIUM CHLORIDE 0.9 % IV SOLN
10.0000 mg | Freq: Once | INTRAVENOUS | Status: AC
  Administered 2022-11-01: 10 mg via INTRAVENOUS
  Filled 2022-11-01: qty 10

## 2022-11-01 MED ORDER — SODIUM CHLORIDE 0.9% FLUSH: 10.0000 mL | INTRAVENOUS | Status: DC | PRN

## 2022-11-01 MED ORDER — LEUCOVORIN CALCIUM INJECTION 350 MG
320.0000 mg/m2 | Freq: Once | INTRAVENOUS | Status: AC
  Administered 2022-11-01: 630 mg via INTRAVENOUS
  Filled 2022-11-01: qty 31.5

## 2022-11-01 MED ORDER — DEXTROSE 5 % IV SOLN: Freq: Once | INTRAVENOUS | Status: AC

## 2022-11-01 MED ORDER — SODIUM CHLORIDE 0.9 % IV SOLN
1960.0000 mg/m2 | INTRAVENOUS | Status: DC
  Administered 2022-11-01: 3850 mg via INTRAVENOUS
  Filled 2022-11-01: qty 77

## 2022-11-01 MED ORDER — FLUOROURACIL CHEMO INJECTION 2.5 GM/50ML
320.0000 mg/m2 | Freq: Once | INTRAVENOUS | Status: AC
  Administered 2022-11-01: 650 mg via INTRAVENOUS
  Filled 2022-11-01: qty 13

## 2022-11-01 MED ORDER — HEPARIN SOD (PORK) LOCK FLUSH 100 UNIT/ML IV SOLN: 500.0000 [IU] | Freq: Once | INTRAVENOUS | Status: DC | PRN

## 2022-11-01 MED ORDER — SODIUM CHLORIDE 0.9 % IV SOLN: Freq: Once | INTRAVENOUS | Status: AC

## 2022-11-01 NOTE — Patient Instructions (Signed)
The chemotherapy medication bag should finish at 46 hours. . For example, if your pump is scheduled for 46 hours and it was put on at 4:00 p.m., it should finish at 2:00 p.m. the day it is scheduled to come off regardless of your appointment time.     Estimated time to finish at 1100.   If the display on your pump reads "Low Volume" and it is beeping, take the batteries out of the pump and come to the cancer center for it to be taken off.   If the pump alarms go off prior to the pump reading "Low Volume" then call [phone] and someone can assist you.  If the plunger comes out and the chemotherapy medication is leaking out, please use your home chemo spill kit to clean up the spill. Do NOT use paper towels or other household products.  If you have problems or questions regarding your pump, please call either [phone] (24 hours a day) or the cancer center Monday-Friday 8:00 a.m.- 4:30 p.m. at the clinic number and we will assist you. If you are unable to get assistance, then go to the nearest Emergency Department and ask the staff to contact the IV team for assistance.    Fluorouracil Injection What is this medication? FLUOROURACIL (flure oh YOOR a sil) treats some types of cancer. It works by slowing down the growth of cancer cells. This medicine may be used for other purposes; ask your health care provider or pharmacist if you have questions. COMMON BRAND NAME(S): Adrucil What should I tell my care team before I take this medication? They need to know if you have any of these conditions: Blood disorders Dihydropyrimidine dehydrogenase (DPD) deficiency Infection, such as chickenpox, cold sores, herpes Kidney disease Liver disease Poor nutrition Recent or ongoing radiation therapy An unusual or allergic reaction to fluorouracil, other medications, foods, dyes, or preservatives If you or your partner are pregnant or trying to get pregnant Breast-feeding How should I use this  medication? This medication is injected into a vein. It is administered by your care team in a hospital or clinic setting. Talk to your care team about the use of this medication in children. Special care may be needed. Overdosage: If you think you have taken too much of this medicine contact a poison control center or emergency room at once. NOTE: This medicine is only for you. Do not share this medicine with others. What if I miss a dose? Keep appointments for follow-up doses. It is important not to miss your dose. Call your care team if you are unable to keep an appointment. What may interact with this medication? Do not take this medication with any of the following: Live virus vaccines This medication may also interact with the following: Medications that treat or prevent blood clots, such as warfarin, enoxaparin, dalteparin This list may not describe all possible interactions. Give your health care provider a list of all the medicines, herbs, non-prescription drugs, or dietary supplements you use. Also tell them if you smoke, drink alcohol, or use illegal drugs. Some items may interact with your medicine. What should I watch for while using this medication? Your condition will be monitored carefully while you are receiving this medication. This medication may make you feel generally unwell. This is not uncommon as chemotherapy can affect healthy cells as well as cancer cells. Report any side effects. Continue your course of treatment even though you feel ill unless your care team tells you to stop. In some  cases, you may be given additional medications to help with side effects. Follow all directions for their use. This medication may increase your risk of getting an infection. Call your care team for advice if you get a fever, chills, sore throat, or other symptoms of a cold or flu. Do not treat yourself. Try to avoid being around people who are sick. This medication may increase your risk  to bruise or bleed. Call your care team if you notice any unusual bleeding. Be careful brushing or flossing your teeth or using a toothpick because you may get an infection or bleed more easily. If you have any dental work done, tell your dentist you are receiving this medication. Avoid taking medications that contain aspirin, acetaminophen, ibuprofen, naproxen, or ketoprofen unless instructed by your care team. These medications may hide a fever. Do not treat diarrhea with over the counter products. Contact your care team if you have diarrhea that lasts more than 2 days or if it is severe and watery. This medication can make you more sensitive to the sun. Keep out of the sun. If you cannot avoid being in the sun, wear protective clothing and sunscreen. Do not use sun lamps, tanning beds, or tanning booths. Talk to your care team if you or your partner wish to become pregnant or think you might be pregnant. This medication can cause serious birth defects if taken during pregnancy and for 3 months after the last dose. A reliable form of contraception is recommended while taking this medication and for 3 months after the last dose. Talk to your care team about effective forms of contraception. Do not father a child while taking this medication and for 3 months after the last dose. Use a condom while having sex during this time period. Do not breastfeed while taking this medication. This medication may cause infertility. Talk to your care team if you are concerned about your fertility. What side effects may I notice from receiving this medication? Side effects that you should report to your care team as soon as possible: Allergic reactions--skin rash, itching, hives, swelling of the face, lips, tongue, or throat Heart attack--pain or tightness in the chest, shoulders, arms, or jaw, nausea, shortness of breath, cold or clammy skin, feeling faint or lightheaded Heart failure--shortness of breath, swelling of  the ankles, feet, or hands, sudden weight gain, unusual weakness or fatigue Heart rhythm changes--fast or irregular heartbeat, dizziness, feeling faint or lightheaded, chest pain, trouble breathing High ammonia level--unusual weakness or fatigue, confusion, loss of appetite, nausea, vomiting, seizures Infection--fever, chills, cough, sore throat, wounds that don't heal, pain or trouble when passing urine, general feeling of discomfort or being unwell Low red blood cell level--unusual weakness or fatigue, dizziness, headache, trouble breathing Pain, tingling, or numbness in the hands or feet, muscle weakness, change in vision, confusion or trouble speaking, loss of balance or coordination, trouble walking, seizures Redness, swelling, and blistering of the skin over hands and feet Severe or prolonged diarrhea Unusual bruising or bleeding Side effects that usually do not require medical attention (report to your care team if they continue or are bothersome): Dry skin Headache Increased tears Nausea Pain, redness, or swelling with sores inside the mouth or throat Sensitivity to light Vomiting This list may not describe all possible side effects. Call your doctor for medical advice about side effects. You may report side effects to FDA at 1-800-FDA-1088. Where should I keep my medication? This medication is given in a hospital or clinic. It  will not be stored at home. NOTE: This sheet is a summary. It may not cover all possible information. If you have questions about this medicine, talk to your doctor, pharmacist, or health care provider.  2024 Elsevier/Gold Standard (2021-06-09 00:00:00) Leucovorin Injection What is this medication? LEUCOVORIN (loo koe VOR in) prevents side effects from certain medications, such as methotrexate. It works by increasing folate levels. This helps protect healthy cells in your body. It may also be used to treat anemia caused by low levels of folate. It can also be  used with fluorouracil, a type of chemotherapy, to treat colorectal cancer. It works by increasing the effects of fluorouracil in the body. This medicine may be used for other purposes; ask your health care provider or pharmacist if you have questions. What should I tell my care team before I take this medication? They need to know if you have any of these conditions: Anemia from low levels of vitamin B12 in the blood An unusual or allergic reaction to leucovorin, folic acid, other medications, foods, dyes, or preservatives Pregnant or trying to get pregnant Breastfeeding How should I use this medication? This medication is injected into a vein or a muscle. It is given by your care team in a hospital or clinic setting. Talk to your care team about the use of this medication in children. Special care may be needed. Overdosage: If you think you have taken too much of this medicine contact a poison control center or emergency room at once. NOTE: This medicine is only for you. Do not share this medicine with others. What if I miss a dose? Keep appointments for follow-up doses. It is important not to miss your dose. Call your care team if you are unable to keep an appointment. What may interact with this medication? Capecitabine Fluorouracil Phenobarbital Phenytoin Primidone Trimethoprim;sulfamethoxazole This list may not describe all possible interactions. Give your health care provider a list of all the medicines, herbs, non-prescription drugs, or dietary supplements you use. Also tell them if you smoke, drink alcohol, or use illegal drugs. Some items may interact with your medicine. What should I watch for while using this medication? Your condition will be monitored carefully while you are receiving this medication. This medication may increase the side effects of 5-fluorouracil. Tell your care team if you have diarrhea or mouth sores that do not get better or that get worse. What side  effects may I notice from receiving this medication? Side effects that you should report to your care team as soon as possible: Allergic reactions--skin rash, itching, hives, swelling of the face, lips, tongue, or throat This list may not describe all possible side effects. Call your doctor for medical advice about side effects. You may report side effects to FDA at 1-800-FDA-1088. Where should I keep my medication? This medication is given in a hospital or clinic. It will not be stored at home. NOTE: This sheet is a summary. It may not cover all possible information. If you have questions about this medicine, talk to your doctor, pharmacist, or health care provider.  2024 Elsevier/Gold Standard (2021-07-07 00:00:00) Nivolumab Injection What is this medication? NIVOLUMAB (nye VOL ue mab) treats some types of cancer. It works by helping your immune system slow or stop the spread of cancer cells. It is a monoclonal antibody. This medicine may be used for other purposes; ask your health care provider or pharmacist if you have questions. COMMON BRAND NAME(S): Opdivo What should I tell my care team  before I take this medication? They need to know if you have any of these conditions: Allogeneic stem cell transplant (uses someone else's stem cells) Autoimmune diseases, such as Crohn disease, ulcerative colitis, lupus History of chest radiation Nervous system problems, such as Guillain-Barre syndrome or myasthenia gravis Organ transplant An unusual or allergic reaction to nivolumab, other medications, foods, dyes, or preservatives Pregnant or trying to get pregnant Breast-feeding How should I use this medication? This medication is infused into a vein. It is given in a hospital or clinic setting. A special MedGuide will be given to you before each treatment. Be sure to read this information carefully each time. Talk to your care team about the use of this medication in children. While it may be  prescribed for children as young as 12 years for selected conditions, precautions do apply. Overdosage: If you think you have taken too much of this medicine contact a poison control center or emergency room at once. NOTE: This medicine is only for you. Do not share this medicine with others. What if I miss a dose? Keep appointments for follow-up doses. It is important not to miss your dose. Call your care team if you are unable to keep an appointment. What may interact with this medication? Interactions have not been studied. This list may not describe all possible interactions. Give your health care provider a list of all the medicines, herbs, non-prescription drugs, or dietary supplements you use. Also tell them if you smoke, drink alcohol, or use illegal drugs. Some items may interact with your medicine. What should I watch for while using this medication? Your condition will be monitored carefully while you are receiving this medication. You may need blood work while taking this medication. This medication may cause serious skin reactions. They can happen weeks to months after starting the medication. Contact your care team right away if you notice fevers or flu-like symptoms with a rash. The rash may be red or purple and then turn into blisters or peeling of the skin. You may also notice a red rash with swelling of the face, lips, or lymph nodes in your neck or under your arms. Tell your care team right away if you have any change in your eyesight. Talk to your care team if you are pregnant or think you might be pregnant. A negative pregnancy test is required before starting this medication. A reliable form of contraception is recommended while taking this medication and for 5 months after the last dose. Talk to your care team about effective forms of contraception. Do not breast-feed while taking this medication and for 5 months after the last dose. What side effects may I notice from receiving  this medication? Side effects that you should report to your care team as soon as possible: Allergic reactions--skin rash, itching, hives, swelling of the face, lips, tongue, or throat Dry cough, shortness of breath or trouble breathing Eye pain, redness, irritation, or discharge with blurry or decreased vision Heart muscle inflammation--unusual weakness or fatigue, shortness of breath, chest pain, fast or irregular heartbeat, dizziness, swelling of the ankles, feet, or hands Hormone gland problems--headache, sensitivity to light, unusual weakness or fatigue, dizziness, fast or irregular heartbeat, increased sensitivity to cold or heat, excessive sweating, constipation, hair loss, increased thirst or amount of urine, tremors or shaking, irritability Infusion reactions--chest pain, shortness of breath or trouble breathing, feeling faint or lightheaded Kidney injury (glomerulonephritis)--decrease in the amount of urine, red or dark brown urine, foamy or bubbly urine,  swelling of the ankles, hands, or feet Liver injury--right upper belly pain, loss of appetite, nausea, light-colored stool, dark yellow or brown urine, yellowing skin or eyes, unusual weakness or fatigue Pain, tingling, or numbness in the hands or feet, muscle weakness, change in vision, confusion or trouble speaking, loss of balance or coordination, trouble walking, seizures Rash, fever, and swollen lymph nodes Redness, blistering, peeling, or loosening of the skin, including inside the mouth Sudden or severe stomach pain, bloody diarrhea, fever, nausea, vomiting Side effects that usually do not require medical attention (report these to your care team if they continue or are bothersome): Bone, joint, or muscle pain Diarrhea Fatigue Loss of appetite Nausea Skin rash This list may not describe all possible side effects. Call your doctor for medical advice about side effects. You may report side effects to FDA at 1-800-FDA-1088. Where  should I keep my medication? This medication is given in a hospital or clinic. It will not be stored at home. NOTE: This sheet is a summary. It may not cover all possible information. If you have questions about this medicine, talk to your doctor, pharmacist, or health care provider.  2024 Elsevier/Gold Standard (2021-06-01 00:00:00)

## 2022-11-03 ENCOUNTER — Inpatient Hospital Stay: Payer: Medicare Other

## 2022-11-03 ENCOUNTER — Ambulatory Visit: Payer: Medicare Other | Admitting: Podiatry

## 2022-11-03 ENCOUNTER — Encounter: Payer: Self-pay | Admitting: Oncology

## 2022-11-03 VITALS — BP 133/58 | HR 81 | Temp 98.2°F | Resp 18 | Ht 60.0 in | Wt 205.0 lb

## 2022-11-03 DIAGNOSIS — E039 Hypothyroidism, unspecified: Secondary | ICD-10-CM | POA: Diagnosis not present

## 2022-11-03 DIAGNOSIS — N182 Chronic kidney disease, stage 2 (mild): Secondary | ICD-10-CM | POA: Diagnosis not present

## 2022-11-03 DIAGNOSIS — I129 Hypertensive chronic kidney disease with stage 1 through stage 4 chronic kidney disease, or unspecified chronic kidney disease: Secondary | ICD-10-CM | POA: Diagnosis not present

## 2022-11-03 DIAGNOSIS — C786 Secondary malignant neoplasm of retroperitoneum and peritoneum: Secondary | ICD-10-CM | POA: Diagnosis not present

## 2022-11-03 DIAGNOSIS — C168 Malignant neoplasm of overlapping sites of stomach: Secondary | ICD-10-CM

## 2022-11-03 DIAGNOSIS — C169 Malignant neoplasm of stomach, unspecified: Secondary | ICD-10-CM | POA: Diagnosis not present

## 2022-11-03 DIAGNOSIS — Z5111 Encounter for antineoplastic chemotherapy: Secondary | ICD-10-CM | POA: Diagnosis not present

## 2022-11-03 MED ORDER — SODIUM CHLORIDE 0.9 % IV SOLN: Freq: Once | INTRAVENOUS | Status: AC

## 2022-11-03 MED ORDER — HEPARIN SOD (PORK) LOCK FLUSH 100 UNIT/ML IV SOLN
500.0000 [IU] | Freq: Once | INTRAVENOUS | Status: AC | PRN
  Administered 2022-11-03: 500 [IU]

## 2022-11-03 MED ORDER — SODIUM CHLORIDE 0.9% FLUSH
10.0000 mL | INTRAVENOUS | Status: DC | PRN
  Administered 2022-11-03: 10 mL

## 2022-11-03 NOTE — Patient Instructions (Signed)
IV Infusion Therapy IV infusion therapy is a type of treatment to deliver a liquid substance (infusion) directly into a vein through a small, thin tube (catheter). You may have IV infusion therapy to receive an infusion of: Fluids. Medicines. Nutrition. Chemotherapy. This is the use of medicines to stop or slow the growth of cancer cells. Blood or blood products. X-ray dye that is given before an imaging procedure, such as an MRI or a CT scan. Tell a health care provider about: Any allergies you have. All medicines you are taking, including vitamins, herbs, eye drops, creams, and over-the-counter medicines. Any problems you or family members have had with anesthetic medicines or X-ray dyes. Any blood disorders you have. Any surgeries you have had, including an axillary lymph node dissection and an arteriovenous fistula for dialysis. Any medical conditions you have. Whether you are pregnant or may be pregnant. Any history of IV drug use. Any history of health care providers not being able to find a vein for an IV or blood draw. What are the risks? Generally, this is a safe procedure. However, problems may occur, including: Pain or bruising. Bleeding. Infection. Failure to place the catheter due to inability to find a vein. Leaking or blockage of the catheter (infiltration). Damage to blood vessels or nerves. Allergic reactions to medicines or dyes. A blood clot. What happens before the procedure? Follow instructions from your health care provider about eating or drinking restrictions. Ask your health care provider about changing or stopping your regular medicines. This is especially important if you are taking diabetes medicines or blood thinners. Learn as much as you can about your treatment. Ask your health care provider for reliable resources, such as websites, books, videos, and people, to help you learn about the treatment you will be having. What happens during the procedure?      Placing the catheter IV infusion therapy starts with a procedure to place a catheter into a vein. An IV tube will be attached to the catheter to allow the infusion to flow into your bloodstream. Your catheter may be placed: Into a vein that is usually in the bend of the elbow, in the forearm, or in the back of the hand (peripheral IV catheter). This type of catheter may need to be inserted into a vein each time you get an infusion. Into a vein near your elbow (midline catheter or PICC). This type of catheter may stay in place for weeks or months at a time so you can receive repeated infusions through it. Into a vein near your neck that leads to your heart (non-tunneled catheter). This type of catheter is only used for short amounts of time because it can cause infection. Through the skin of your chest and into a large vein that leads to your heart (tunneled catheter). This type of catheter may stay in your body for months or years. So that it connects to an implanted port. An implanted port is a device that is surgically inserted under the skin of the chest to provide long-term IV access. The catheter will connect the port to a large vein in the chest or upper arm. A port may be kept in place for many months or years. Each time you have an infusion, a needle will be inserted through your skin to connect the catheter to the port. After your catheter is placed, your health care team will lower your risk of infection by washing the skin at the IV site with a germ-killing (antiseptic) solution.  Doing the infusion To start the infusion, your health care provider will: Attach the IV tubing to your catheter. Use a tape or an adhesive bandage (dressing) to hold the catheter and tubing in place against your skin. Use an IV pump to control the flow of the IV infusion, if needed. During the infusion, your health care provider will check the area to make sure: There is no swelling or pain. Your IV infusion  is flowing through the catheter properly. After the infusion, your health care provider will: Remove the dressing or tape. Disconnect the tubing from the catheter. Remove the catheter, if you have a peripheral IV. Apply pressure over the IV insertion site to stop bleeding, then cover the area with a dressing. If you have an implanted port, PICC, non-tunneled, or tunneled catheter, your health care team may leave the catheter in place. This depends on your treatment, your medical condition, and what type of catheter you have. The procedure may vary among health care providers and hospitals. What can I expect after the procedure? Your blood pressure, heart rate, breathing rate, and blood oxygen level may be monitored until you leave the hospital or clinic. Follow these instructions at home: Take over-the-counter and prescription medicines only as told by your health care provider. Change or remove any dressings only as told by your health care provider. Return to your normal activities as told by your health care provider. Ask your health care provider what activities are safe for you. Do not take baths, swim, or use a hot tub until your health care provider approves. Ask your health care provider if you may take showers. Check your IV insertion site every day for signs of infection. Check for: Redness, swelling, or pain. Fluid or blood. If fluid or blood drains from your IV site, use your hands to press down firmly on the area for a minute or two. Doing this should stop the bleeding. Warmth. Pus or a bad smell. Keep all follow-up visits. This is important. Contact a health care provider if: You have signs of infection around your IV site. You have a fever or chills. You have fluid or blood coming from your IV site that does not stop after you apply pressure to the site. You have itchy skin. You have a rash or blisters. You have itchy, red, swollen areas of skin (hives). Get help right away  if: You have trouble breathing. You have chest pain. These symptoms may represent a serious problem that is an emergency. Do not wait to see if the symptoms will go away. Get medical help right away. Call your local emergency services (911 in the [place] Do not drive yourself to the hospital. Summary IV infusion therapy is a type of treatment to deliver a liquid substance (infusion) directly into a vein. Check your IV insertion site every day for signs of infection, such as redness or swelling. Change or remove bandages (dressings) only as told by your health care provider. Call your health care provider if you notice any signs of infection around your IV site or have a rash or blisters. This information is not intended to replace advice given to you by your health care provider. Make sure you discuss any questions you have with your health care provider. Document Revised: 09/14/2019 Document Reviewed: 09/15/2019 Elsevier Patient Education  2024 [employer].

## 2022-11-10 ENCOUNTER — Inpatient Hospital Stay: Payer: Medicare Other

## 2022-11-10 ENCOUNTER — Inpatient Hospital Stay (INDEPENDENT_AMBULATORY_CARE_PROVIDER_SITE_OTHER): Payer: Medicare Other | Admitting: Hematology and Oncology

## 2022-11-10 ENCOUNTER — Encounter: Payer: Self-pay | Admitting: Hematology and Oncology

## 2022-11-10 VITALS — BP 140/60 | HR 85 | Temp 98.4°F | Resp 20 | Ht 60.0 in | Wt 200.0 lb

## 2022-11-10 DIAGNOSIS — Z5111 Encounter for antineoplastic chemotherapy: Secondary | ICD-10-CM | POA: Diagnosis not present

## 2022-11-10 DIAGNOSIS — E039 Hypothyroidism, unspecified: Secondary | ICD-10-CM | POA: Diagnosis not present

## 2022-11-10 DIAGNOSIS — C169 Malignant neoplasm of stomach, unspecified: Secondary | ICD-10-CM | POA: Diagnosis not present

## 2022-11-10 DIAGNOSIS — C168 Malignant neoplasm of overlapping sites of stomach: Secondary | ICD-10-CM | POA: Diagnosis not present

## 2022-11-10 DIAGNOSIS — N142 Nephropathy induced by unspecified drug, medicament or biological substance: Secondary | ICD-10-CM

## 2022-11-10 DIAGNOSIS — C786 Secondary malignant neoplasm of retroperitoneum and peritoneum: Secondary | ICD-10-CM | POA: Diagnosis not present

## 2022-11-10 DIAGNOSIS — N182 Chronic kidney disease, stage 2 (mild): Secondary | ICD-10-CM | POA: Diagnosis not present

## 2022-11-10 DIAGNOSIS — I129 Hypertensive chronic kidney disease with stage 1 through stage 4 chronic kidney disease, or unspecified chronic kidney disease: Secondary | ICD-10-CM | POA: Diagnosis not present

## 2022-11-10 LAB — CBC WITH DIFFERENTIAL (CANCER CENTER ONLY)
Abs Immature Granulocytes: 0.04 10*3/uL (ref 0.00–0.07)
Basophils Absolute: 0 10*3/uL (ref 0.0–0.1)
Basophils Relative: 1 %
Eosinophils Absolute: 0.5 10*3/uL (ref 0.0–0.5)
Eosinophils Relative: 8 %
HCT: 37.5 % (ref 36.0–46.0)
Hemoglobin: 12.6 g/dL (ref 12.0–15.0)
Immature Granulocytes: 1 %
Lymphocytes Relative: 34 %
Lymphs Abs: 2.2 10*3/uL (ref 0.7–4.0)
MCH: 31 pg (ref 26.0–34.0)
MCHC: 33.6 g/dL (ref 30.0–36.0)
MCV: 92.1 fL (ref 80.0–100.0)
Monocytes Absolute: 0.7 10*3/uL (ref 0.1–1.0)
Monocytes Relative: 10 %
Neutro Abs: 3 10*3/uL (ref 1.7–7.7)
Neutrophils Relative %: 46 %
Platelet Count: 185 10*3/uL (ref 150–400)
RBC: 4.07 MIL/uL (ref 3.87–5.11)
RDW: 14.2 % (ref 11.5–15.5)
WBC Count: 6.5 10*3/uL (ref 4.0–10.5)
nRBC: 0 % (ref 0.0–0.2)

## 2022-11-10 LAB — CMP (CANCER CENTER ONLY)
ALT: 17 U/L (ref 0–44)
AST: 21 U/L (ref 15–41)
Albumin: 4.2 g/dL (ref 3.5–5.0)
Alkaline Phosphatase: 67 U/L (ref 38–126)
Anion gap: 11 (ref 5–15)
BUN: 20 mg/dL (ref 8–23)
CO2: 23 mmol/L (ref 22–32)
Calcium: 9.4 mg/dL (ref 8.9–10.3)
Chloride: 103 mmol/L (ref 98–111)
Creatinine: 1.43 mg/dL — ABNORMAL HIGH (ref 0.44–1.00)
GFR, Estimated: 36 mL/min — ABNORMAL LOW (ref >60)
Glucose, Bld: 138 mg/dL — ABNORMAL HIGH (ref 70–99)
Potassium: 3.6 mmol/L (ref 3.5–5.1)
Sodium: 137 mmol/L (ref 135–145)
Total Bilirubin: 1 mg/dL (ref 0.3–1.2)
Total Protein: 7.1 g/dL (ref 6.5–8.1)

## 2022-11-10 LAB — TSH: TSH: 2.781 u[IU]/mL (ref 0.350–4.500)

## 2022-11-10 NOTE — Assessment & Plan Note (Addendum)
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  Stain for HER2 was negative.  PET scan revealed omental involvement, but no distant metastasis was seen. She is receiving palliative chemotherapy with FOLFOX/nivolumab (5-fluorouracil/oxaliplatin/nivolumab), which now consists of 5-fluorouracil/nivolumab.  Oxaliplatin was discontinued after 11 cycles.  PET revealed resolution of metabolic activity associated with the stomach.  Decrease in size of omental nodularity. No associated metabolic activity.   EGD in March revealed residual disease.  CT chest, abdomen and pelvis in July revealed stable disease with persistent mild gastric wall thickening and subtle adjacent omental nodularity.  She continues to tolerate treatment fairly well.  She receives extra IV fluids with her infusion to prevent recurrent acute kidney injury.  She will proceed with 24th cycle next week.  We will plan to see her back in 2 weeks with a CBC, comprehensive metabolic panel, and TSH prior to a 25th cycle.  We plan repeat CT imaging in October.

## 2022-11-10 NOTE — Assessment & Plan Note (Addendum)
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  Her creatinine is slightly above baseline at 1.43.  We continue to give her IV fluids the week of her chemotherapy.  We will continue to monitor this.

## 2022-11-10 NOTE — Progress Notes (Signed)
[facility]  [address] [place],  [place]  [address] [phone]  Clinic Day:  11/10/2022  Referring physician: Philemon Kingdom, MD  ASSESSMENT & PLAN:   Assessment & Plan: Gastric cancer (HCC) Stage IVB (T4 N0 M1) poorly differentiated adenocarcinoma of the stomach with signet ring features and ulceration, metastatic to the omentum.  Stain for HER2 was negative.  PET scan revealed omental involvement, but no distant metastasis was seen. She is receiving palliative chemotherapy with FOLFOX/nivolumab (5-fluorouracil/oxaliplatin/nivolumab), which now consists of 5-fluorouracil/nivolumab.  Oxaliplatin was discontinued after 11 cycles.  PET revealed resolution of metabolic activity associated with the stomach.  Decrease in size of omental nodularity. No associated metabolic activity.   EGD in March revealed residual disease.  CT chest, abdomen and pelvis in July revealed stable disease with persistent mild gastric wall thickening and subtle adjacent omental nodularity.  She continues to tolerate treatment fairly well.  She receives extra IV fluids with her infusion to prevent recurrent acute kidney injury.  She will proceed with 24th cycle next week.  We will plan to see her back in 2 weeks with a CBC, comprehensive metabolic panel, and TSH prior to a 25th cycle.  We plan repeat CT imaging in October.  Drug-induced interstitial nephritis History immune mediated nephritis from immunotherapy in February, which resolved with prednisone.  Her creatinine is slightly above baseline at 1.43.  We continue to give her IV fluids the week of her chemotherapy.  We will continue to monitor this.     The patient and her daughter understand the plans discussed today and are in agreement with them.  She will contact us if her diarrhea does not improve by the end of the week.  They know to contact our office if she develops other concerns prior to her next  appointment.   I provided 15 minutes of face-to-face time during this encounter and > 50% was spent counseling as documented under my assessment and plan.    Adah Perl, PA-C  [facility] [address] [place] [address] Dept: [phone] Dept Fax: [phone]   No orders of the defined types were placed in this encounter.     CHIEF COMPLAINT:  CC: Stage IVB adenocarcinoma of the stomach  Current Treatment: Infusional 5-fluorouracil/leucovorin/nivolumab every 2 weeks  HISTORY OF PRESENT ILLNESS:  Mackenzie Lewis is a 87 y.o. female with stage IVB (T4b, N0, M1) gastric cancer diagnosed in July 2023.  She was referred by Dr. Shara Blazing for assessment and management.  She had noticed that she was having regurgitation when eating and had lost over 30 pounds.  An ultrasound was done, revealing hepatic steatosis, and led to an MRI scan in June, which revealed gastric wall thickening with confluent nodularity of the omentum anterior to the stomach measuring 3.5 cm consistent with metastatic tumor.  She also had low-grade edema and wall thickening extending into the duodenum from the stomach.  She was referred to Dr. Shara Blazing and he did an EGD in July.  This revealed a large ulceration measuring 1.2 cm along the greater curvature.  She also had diffusely edematous and erythematous wall with erosions of the antrum and stiff and friable mucosa with oozing of blood.  These findings extended to the gastric fundus as well.  Pathology revealed a poorly differentiated adenocarcinoma with signet ring features from the biopsies of the ulcer as well as the antrum and the fundus  of the stomach.  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  She was placed on omeprazole.  Her test for H. pylori was negative.  She was referred to Dr. Hardin Negus for consideration of surgery, but he  felt this was not resectable because of the extensive involvement.  We consider this extending to the duodenum and metastatic to the omentum.  PET scan confirmed these findings.  She wished to pursue systemic intravenous therapy.  CEA and CA 19-9 were normal. She has been receiving palliative FOLFOX/nivolumab, which now consists of 5-fluorouracil/leucovorin/nivolumab.  Oxaliplatin was discontinued after 11 cycles.  She has tolerated this fairly well.  PET in February revealed resolution of metabolic activity associated with the stomach. Decrease in size of omental nodularity without associated metabolic activity. No evidence of new or progressive gastric carcinoma. New peripheral consolidation in the RIGHT lower lobe with moderate metabolic activity. Favor focus of atelectasis versus less likely pneumonia. EGD in March revealed persistent disease.  CT chest, abdomen and pelvis in July revealed stable disease with persistent mild gastric wall thickening and subtle adjacent omental nodularity.    Oncology History  Gastric cancer (HCC)  09/10/2021 Initial Diagnosis   Gastric cancer (HCC)   09/10/2021 Cancer Staging   Staging form: Stomach, AJCC 8th Edition - Clinical stage from 09/10/2021: Stage IVB (cT4b, cN0, cM1) - Signed by Dellia Beckwith, MD on 09/10/2021 Histopathologic type: Adenocarcinoma, NOS Stage prefix: Initial diagnosis Total positive nodes: 0 Histologic grade (G): G3 Histologic grading system: 3 grade system Sites of metastasis: Peritoneal surface Diagnostic confirmation: Positive histology PLUS positive immunophenotyping and/or positive genetic studies Specimen type: Endoscopy with Biopsy Staged by: Managing physician Carcinoembryonic antigen (CEA) (ng/mL): 2.8 Carbohydrate antigen 19-9 (CA 19-9) (U/mL): 4.9 HER2 status: Unknown Microsatellite instability (MSI): Unknown Tumor location in stomach: Other Clinical staging modalities: Biopsy, Endoscopy Stage used in treatment  planning: Yes National guidelines used in treatment planning: Yes Type of national guideline used in treatment planning: NCCN   09/28/2021 - 10/14/2021 Chemotherapy   Patient is on Treatment Plan : GASTROESOPHAGEAL FOLFOX + Nivolumab q14d     09/28/2021 -  Chemotherapy   Patient is on Treatment Plan : GASTROESOPHAGEAL FOLFOX + Nivolumab q14d     12/09/2021 Genetic Testing   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  Report date is 12/09/2021.   The Multi-Cancer + RNA Panel offered by [facility] includes sequencing and/or deletion/duplication analysis of the following 84 genes:  AIP*, ALK, APC*, ATM*, AXIN2*, BAP1*, BARD1*, BLM*, BMPR1A*, BRCA1*, BRCA2*, BRIP1*, CASR, CDC73*, CDH1*, CDK4, CDKN1B*, CDKN1C*, CDKN2A, CEBPA, CHEK2*, CTNNA1*, DICER1*, DIS3L2*, EGFR, EPCAM, FH*, FLCN*, GATA2*, GPC3, GREM1, HOXB13, HRAS, KIT, MAX*, MEN1*, MET, MITF, MLH1*, MSH2*, MSH3*, MSH6*, MUTYH*, NBN*, NF1*, NF2*, NTHL1*, PALB2*, PDGFRA, PHOX2B, PMS2*, POLD1*, POLE*, POT1*, PRKAR1A*, PTCH1*, PTEN*, RAD50*, RAD51C*, RAD51D*, RB1*, RECQL4, RET, RUNX1*, SDHA*, SDHAF2*, SDHB*, SDHC*, SDHD*, SMAD4*, SMARCA4*, SMARCB1*, SMARCE1*, STK11*, SUFU*, TERC, TERT, TMEM127*, Tp53*, TSC1*, TSC2*, VHL*, WRN*, and WT1.  RNA analysis is performed for * genes.       INTERVAL HISTORY:  Jacquese is here today for repeat clinical assessment prior to a 24th cycle of 5-fluorouracil/leucovorin/nivolumab.  She continues to tolerate this well, except for fatigue.  She reports diarrhea and abdominal cramping, which she attributes to amoxicillin she took prior to her dental procedure on September 23.  She reports stable neuropathy of her hands and feet.  She denies shortness of breath or cough.  She denies rashes.  She denies fevers or chills. She denies pain. Her appetite is good. Her weight has been stable.  REVIEW OF SYSTEMS:  Review of Systems  Constitutional:  Positive for fatigue. Negative for appetite  change, chills, fever and unexpected weight change.  HENT:   Negative for lump/mass, mouth sores and sore throat.   Respiratory:  Negative for cough and shortness of breath.   Cardiovascular:  Negative for chest pain and leg swelling.  Gastrointestinal:  Positive for abdominal pain and diarrhea. Negative for constipation, nausea and vomiting.  Endocrine: Negative for hot flashes.  Genitourinary:  Negative for difficulty urinating, dysuria, frequency and hematuria.   Musculoskeletal:  Negative for arthralgias, back pain and myalgias.  Skin:  Negative for rash.  Neurological:  Negative for dizziness and headaches.  Hematological:  Negative for adenopathy. Does not bruise/bleed easily.  Psychiatric/Behavioral:  Negative for depression and sleep disturbance. The patient is not nervous/anxious.      VITALS:  Blood pressure (!) 140/60, pulse 85, temperature 98.4 F (36.9 C), temperature source Oral, resp. rate 20, height 5' (1.524 m), weight 200 lb (90.7 kg), SpO2 98%.  Wt Readings from Last 3 Encounters:  11/10/22 200 lb (90.7 kg)  11/03/22 205 lb (93 kg)  11/01/22 202 lb 6.4 oz (91.8 kg)    Body mass index is 39.06 kg/m.  Performance status (ECOG): 1 - Symptomatic but completely ambulatory  PHYSICAL EXAM:  Physical Exam Vitals and nursing note reviewed.  Constitutional:      General: She is not in acute distress.    Appearance: Normal appearance.  HENT:     Head: Normocephalic and atraumatic.     Mouth/Throat:     Mouth: Mucous membranes are moist.     Pharynx: Oropharynx is clear. No oropharyngeal exudate or posterior oropharyngeal erythema.  Eyes:     General: No scleral icterus.    Extraocular Movements: Extraocular movements intact.     Conjunctiva/sclera: Conjunctivae normal.     Pupils: Pupils are equal, round, and reactive to light.  Cardiovascular:     Rate and Rhythm: Normal rate and regular rhythm.     Heart sounds: Normal heart sounds. No murmur heard.    No  friction rub. No gallop.  Pulmonary:     Effort: Pulmonary effort is normal.     Breath sounds: Normal breath sounds. No wheezing, rhonchi or rales.  Abdominal:     General: There is no distension.     Palpations: Abdomen is soft. There is no hepatomegaly, splenomegaly or mass.     Tenderness: There is no abdominal tenderness.  Musculoskeletal:        General: Normal range of motion.     Cervical back: Normal range of motion and neck supple. No tenderness.     Right lower leg: No edema.     Left lower leg: No edema.  Lymphadenopathy:     Cervical: No cervical adenopathy.     Upper Body:     Right upper body: No supraclavicular or axillary adenopathy.     Left upper body: No supraclavicular or axillary adenopathy.     Lower Body: No right inguinal adenopathy. No left inguinal adenopathy.  Skin:    General: Skin is warm and dry.     Coloration: Skin is not jaundiced.     Findings: No rash.  Neurological:     Mental Status: She is alert and oriented to person, place, and time.     Cranial Nerves: No cranial nerve deficit.  Psychiatric:  Mood and Affect: Mood normal.        Behavior: Behavior normal.        Thought Content: Thought content normal.     LABS:      Latest Ref Rng & Units 11/10/2022    9:34 AM 10/28/2022   12:00 AM 10/14/2022   10:32 AM  CBC  WBC 4.0 - 10.5 K/uL 6.5  9.1     8.0      Hemoglobin 12.0 - 15.0 g/dL 13.0  86.5     78.4      Hematocrit 36.0 - 46.0 % 37.5  37     35      Platelets 150 - 400 K/uL 185  211     216         This result is from an external source.      Latest Ref Rng & Units 11/10/2022    9:34 AM 10/28/2022   12:00 AM 10/14/2022   10:32 AM  CMP  Glucose 70 - 99 mg/dL 696     BUN 8 - 23 mg/dL 20  20     19       Creatinine 0.44 - 1.00 mg/dL 2.95  1.2     1.3      Sodium 135 - 145 mmol/L 137  135     138      Potassium 3.5 - 5.1 mmol/L 3.6  4.2     4.1      Chloride 98 - 111 mmol/L 103  105     103      CO2 22 - 32 mmol/L 23  24      22       Calcium 8.9 - 10.3 mg/dL 9.4  9.6     9.9      Total Protein 6.5 - 8.1 g/dL 7.1     Total Bilirubin 0.3 - 1.2 mg/dL 1.0     Alkaline Phos 38 - 126 U/L 67  69     69      AST 15 - 41 U/L 21  26     25       ALT 0 - 44 U/L 17  19     19          This result is from an external source.     Lab Results  Component Value Date   CEA1 2.9 09/10/2021   /  CEA  Date Value Ref Range Status  09/10/2021 2.9 0.0 - 4.7 ng/mL Final    Comment:    (NOTE)                             Nonsmokers          <3.9                             Smokers             <5.6 [employer] Electrochemiluminescence Immunoassay (ECLIA) Values obtained with different assay methods or kits cannot be used interchangeably.  Results cannot be interpreted as absolute evidence of the presence or absence of malignant disease. Performed At: [facility] [address] [place], [place] [id number] Jolene Schimke MD NU:[id number]    No results found for: "PSA1" No results found for: "CAN199" No results found for: "CAN125"  No results found for: "TOTALPROTELP", "ALBUMINELP", "A1GS", "A2GS", "BETS", "BETA2SER", "GAMS", "MSPIKE", "SPEI" No  results found for: "TIBC", "FERRITIN", "IRONPCTSAT" No results found for: "LDH"  STUDIES:  No results found.    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  If that happens, she is to call us so that we can schedule her for an  ultrasound to look for synovitis.     Essential hypertension 09/09/2016   Gastric cancer (HCC) 09/10/2021   Hyperlipidemia 09/01/2016   Hypertension    Hypothyroidism 09/01/2016   Osteoarthritis of both feet 01/20/2016   Osteoarthritis, hand 01/20/2016   Thyroid disease     Past Surgical History:  Procedure Laterality Date   APPENDECTOMY     LAPAROSCOPIC APPENDECTOMY N/A 04/10/2016   Procedure: APPENDECTOMY LAPAROSCOPIC;  Surgeon: Abigail Miyamoto, MD;  Location: [facility] OR;  Service: General;  Laterality: N/A;    Family History  Problem Relation Age of Onset   Hypertension Mother    Prostate cancer Father        metastatic; d. 45   Brain cancer Sister 22   Breast cancer Sister 60   AAA (abdominal aortic aneurysm) Brother    Leukemia Cousin        x2 maternal female cousins; d. before 70   Breast cancer Daughter 44       DCIS    Social History:  reports that she has never smoked. She has never used smokeless tobacco. She reports that she does not currently use alcohol. She reports that she does not use drugs.The patient is accompanied by her boyfriend and her daughter today.  Allergies:  Allergies  Allergen Reactions   Doxycycline Rash   Sulfa Antibiotics Rash and Other (See Comments)    Other reaction(s): Other (See Comments)  "Made me feel weird"    Current Medications: Current Outpatient Medications  Medication Sig Dispense Refill   amoxicillin (AMOXIL) 500 MG capsule Take 1,000 mg by mouth 2 (two) times daily. (Patient not taking: Reported on 11/10/2022)     aspirin 81 MG EC tablet Take 81 mg by mouth daily. Swallow whole.     b complex vitamins capsule Take 1 capsule by mouth daily.     Calcium Carbonate (CALCIUM 600 PO) Take 1 tablet by mouth daily.     Cholecalciferol (VITAMIN D3) 5000 units CAPS Take 1 capsule by mouth daily.     EXFORGE HCT 5-160-12.5 MG TABS Take 1 tablet by mouth daily.     famotidine (PEPCID) 40 MG tablet Take 40 mg by mouth at bedtime.     KRILL  OIL PO Take 1 capsule by mouth daily. Unknown strenght     Lactobacillus TABS Take 1 tablet by mouth 2 (two) times daily.     levothyroxine (SYNTHROID, LEVOTHROID) 75 MCG tablet Take 75 mcg by mouth daily before breakfast.      NON FORMULARY MMW: 3 parts Maalox 2 parts Benadryl 1 part viscious lidicaine  Disp. 6oz  Instructions: 5ml swish and swallow every 3-4 hours     omeprazole (PRILOSEC) 40 MG capsule Take  1 capsule (40 mg total) by mouth 2 (two) times daily. 60 capsule 5   ondansetron (ZOFRAN-ODT) 4 MG disintegrating tablet Take 1 tablet (4 mg total) by mouth every 8 (eight) hours as needed for nausea or vomiting. 90 tablet 0   polyethylene glycol powder (GLYCOLAX/MIRALAX) 17 GM/SCOOP powder SMARTSIG:1 scoopful By Mouth Daily     potassium chloride SA (KLOR-CON M) 20 MEQ tablet TAKE ONE TABLET BY MOUTH THREE TIMES DAILY (Patient taking differently: Take 20 mEq by mouth 3 (three) times daily. Patient reports taking one tablet twice daily) 90 tablet 5   pravastatin (PRAVACHOL) 20 MG tablet Take 1 tablet (20 mg total) by mouth every evening. 90 tablet 3   Probiotic Product (PROBIOTIC DAILY PO) Take 1 tablet by mouth daily.     prochlorperazine (COMPAZINE) 10 MG tablet Take 1 tablet (10 mg total) by mouth every 6 (six) hours as needed for nausea or vomiting. 90 tablet 3   triamcinolone 0.1% oint-Eucerin equivalent cream 1:1 mixture Apply topically 2 (two) times daily. 160 g 0   triamcinolone cream (KENALOG) 0.1 % Apply to affected skin twice daily (Patient taking differently: daily as needed. Apply to affected skin twice daily) 160 g 0   No current facility-administered medications for this visit.

## 2022-11-11 LAB — T4: T4, Total: 9.9 ug/dL (ref 4.5–12.0)

## 2022-11-12 ENCOUNTER — Ambulatory Visit: Payer: Medicare Other | Admitting: Podiatry

## 2022-11-12 MED FILL — Nivolumab IV Soln 240 MG/24ML: INTRAVENOUS | Qty: 24 | Status: AC

## 2022-11-12 MED FILL — Leucovorin Calcium For Inj 350 MG: INTRAMUSCULAR | Qty: 31.5 | Status: AC

## 2022-11-12 MED FILL — Fluorouracil IV Soln 2.5 GM/50ML (50 MG/ML): INTRAVENOUS | Qty: 13 | Status: AC

## 2022-11-12 MED FILL — Dexamethasone Sodium Phosphate Inj 100 MG/10ML: INTRAMUSCULAR | Qty: 1 | Status: AC

## 2022-11-15 ENCOUNTER — Inpatient Hospital Stay: Payer: Medicare Other

## 2022-11-15 VITALS — BP 130/70 | HR 82 | Temp 98.0°F | Resp 18

## 2022-11-15 DIAGNOSIS — Z5111 Encounter for antineoplastic chemotherapy: Secondary | ICD-10-CM | POA: Diagnosis not present

## 2022-11-15 DIAGNOSIS — C168 Malignant neoplasm of overlapping sites of stomach: Secondary | ICD-10-CM

## 2022-11-15 DIAGNOSIS — E039 Hypothyroidism, unspecified: Secondary | ICD-10-CM | POA: Diagnosis not present

## 2022-11-15 DIAGNOSIS — N182 Chronic kidney disease, stage 2 (mild): Secondary | ICD-10-CM | POA: Diagnosis not present

## 2022-11-15 DIAGNOSIS — I129 Hypertensive chronic kidney disease with stage 1 through stage 4 chronic kidney disease, or unspecified chronic kidney disease: Secondary | ICD-10-CM | POA: Diagnosis not present

## 2022-11-15 DIAGNOSIS — C786 Secondary malignant neoplasm of retroperitoneum and peritoneum: Secondary | ICD-10-CM | POA: Diagnosis not present

## 2022-11-15 DIAGNOSIS — C169 Malignant neoplasm of stomach, unspecified: Secondary | ICD-10-CM | POA: Diagnosis not present

## 2022-11-15 MED ORDER — SODIUM CHLORIDE 0.9 % IV SOLN
240.0000 mg | Freq: Once | INTRAVENOUS | Status: AC
  Administered 2022-11-15: 240 mg via INTRAVENOUS
  Filled 2022-11-15: qty 24

## 2022-11-15 MED ORDER — SODIUM CHLORIDE 0.9 % IV SOLN
10.0000 mg | Freq: Once | INTRAVENOUS | Status: AC
  Administered 2022-11-15: 10 mg via INTRAVENOUS
  Filled 2022-11-15: qty 10

## 2022-11-15 MED ORDER — SODIUM CHLORIDE 0.9 % IV SOLN: Freq: Once | INTRAVENOUS | Status: AC

## 2022-11-15 MED ORDER — PALONOSETRON HCL INJECTION 0.25 MG/5ML
0.2500 mg | Freq: Once | INTRAVENOUS | Status: AC
  Administered 2022-11-15: 0.25 mg via INTRAVENOUS
  Filled 2022-11-15: qty 5

## 2022-11-15 MED ORDER — FLUOROURACIL CHEMO INJECTION 2.5 GM/50ML
320.0000 mg/m2 | Freq: Once | INTRAVENOUS | Status: AC
  Administered 2022-11-15: 650 mg via INTRAVENOUS
  Filled 2022-11-15: qty 13

## 2022-11-15 MED ORDER — DEXTROSE 5 % IV SOLN: Freq: Once | INTRAVENOUS | Status: AC

## 2022-11-15 MED ORDER — SODIUM CHLORIDE 0.9 % IV SOLN
1960.0000 mg/m2 | INTRAVENOUS | Status: DC
  Administered 2022-11-15: 3850 mg via INTRAVENOUS
  Filled 2022-11-15: qty 77

## 2022-11-15 MED ORDER — LEUCOVORIN CALCIUM INJECTION 350 MG
320.0000 mg/m2 | Freq: Once | INTRAVENOUS | Status: AC
  Administered 2022-11-15: 630 mg via INTRAVENOUS
  Filled 2022-11-15: qty 31.5

## 2022-11-15 NOTE — Patient Instructions (Signed)
Fluorouracil Injection What is this medication? FLUOROURACIL (flure oh YOOR a sil) treats some types of cancer. It works by slowing down the growth of cancer cells. This medicine may be used for other purposes; ask your health care provider or pharmacist if you have questions. COMMON BRAND NAME(S): Adrucil What should I tell my care team before I take this medication? They need to know if you have any of these conditions: Blood disorders Dihydropyrimidine dehydrogenase (DPD) deficiency Infection, such as chickenpox, cold sores, herpes Kidney disease Liver disease Poor nutrition Recent or ongoing radiation therapy An unusual or allergic reaction to fluorouracil, other medications, foods, dyes, or preservatives If you or your partner are pregnant or trying to get pregnant Breast-feeding How should I use this medication? This medication is injected into a vein. It is administered by your care team in a hospital or clinic setting. Talk to your care team about the use of this medication in children. Special care may be needed. Overdosage: If you think you have taken too much of this medicine contact a poison control center or emergency room at once. NOTE: This medicine is only for you. Do not share this medicine with others. What if I miss a dose? Keep appointments for follow-up doses. It is important not to miss your dose. Call your care team if you are unable to keep an appointment. What may interact with this medication? Do not take this medication with any of the following: Live virus vaccines This medication may also interact with the following: Medications that treat or prevent blood clots, such as warfarin, enoxaparin, dalteparin This list may not describe all possible interactions. Give your health care provider a list of all the medicines, herbs, non-prescription drugs, or dietary supplements you use. Also tell them if you smoke, drink alcohol, or use illegal drugs. Some items may  interact with your medicine. What should I watch for while using this medication? Your condition will be monitored carefully while you are receiving this medication. This medication may make you feel generally unwell. This is not uncommon as chemotherapy can affect healthy cells as well as cancer cells. Report any side effects. Continue your course of treatment even though you feel ill unless your care team tells you to stop. In some cases, you may be given additional medications to help with side effects. Follow all directions for their use. This medication may increase your risk of getting an infection. Call your care team for advice if you get a fever, chills, sore throat, or other symptoms of a cold or flu. Do not treat yourself. Try to avoid being around people who are sick. This medication may increase your risk to bruise or bleed. Call your care team if you notice any unusual bleeding. Be careful brushing or flossing your teeth or using a toothpick because you may get an infection or bleed more easily. If you have any dental work done, tell your dentist you are receiving this medication. Avoid taking medications that contain aspirin, acetaminophen, ibuprofen, naproxen, or ketoprofen unless instructed by your care team. These medications may hide a fever. Do not treat diarrhea with over the counter products. Contact your care team if you have diarrhea that lasts more than 2 days or if it is severe and watery. This medication can make you more sensitive to the sun. Keep out of the sun. If you cannot avoid being in the sun, wear protective clothing and sunscreen. Do not use sun lamps, tanning beds, or tanning booths. Talk to   your care team if you or your partner wish to become pregnant or think you might be pregnant. This medication can cause serious birth defects if taken during pregnancy and for 3 months after the last dose. A reliable form of contraception is recommended while taking this  medication and for 3 months after the last dose. Talk to your care team about effective forms of contraception. Do not father a child while taking this medication and for 3 months after the last dose. Use a condom while having sex during this time period. Do not breastfeed while taking this medication. This medication may cause infertility. Talk to your care team if you are concerned about your fertility. What side effects may I notice from receiving this medication? Side effects that you should report to your care team as soon as possible: Allergic reactions--skin rash, itching, hives, swelling of the face, lips, tongue, or throat Heart attack--pain or tightness in the chest, shoulders, arms, or jaw, nausea, shortness of breath, cold or clammy skin, feeling faint or lightheaded Heart failure--shortness of breath, swelling of the ankles, feet, or hands, sudden weight gain, unusual weakness or fatigue Heart rhythm changes--fast or irregular heartbeat, dizziness, feeling faint or lightheaded, chest pain, trouble breathing High ammonia level--unusual weakness or fatigue, confusion, loss of appetite, nausea, vomiting, seizures Infection--fever, chills, cough, sore throat, wounds that don't heal, pain or trouble when passing urine, general feeling of discomfort or being unwell Low red blood cell level--unusual weakness or fatigue, dizziness, headache, trouble breathing Pain, tingling, or numbness in the hands or feet, muscle weakness, change in vision, confusion or trouble speaking, loss of balance or coordination, trouble walking, seizures Redness, swelling, and blistering of the skin over hands and feet Severe or prolonged diarrhea Unusual bruising or bleeding Side effects that usually do not require medical attention (report to your care team if they continue or are bothersome): Dry skin Headache Increased tears Nausea Pain, redness, or swelling with sores inside the mouth or throat Sensitivity  to light Vomiting This list may not describe all possible side effects. Call your doctor for medical advice about side effects. You may report side effects to FDA at 1-800-FDA-1088. Where should I keep my medication? This medication is given in a hospital or clinic. It will not be stored at home. NOTE: This sheet is a summary. It may not cover all possible information. If you have questions about this medicine, talk to your doctor, pharmacist, or health care provider.  2024 [employer] Standard (2021-06-09 00:00:00) Leucovorin Injection What is this medication? LEUCOVORIN (loo koe VOR in) prevents side effects from certain medications, such as methotrexate. It works by increasing folate levels. This helps protect healthy cells in your body. It may also be used to treat anemia caused by low levels of folate. It can also be used with fluorouracil, a type of chemotherapy, to treat colorectal cancer. It works by increasing the effects of fluorouracil in the body. This medicine may be used for other purposes; ask your health care provider or pharmacist if you have questions. What should I tell my care team before I take this medication? They need to know if you have any of these conditions: Anemia from low levels of vitamin B12 in the blood An unusual or allergic reaction to leucovorin, folic acid, other medications, foods, dyes, or preservatives Pregnant or trying to get pregnant Breastfeeding How should I use this medication? This medication is injected into a vein or a muscle. It is given by your care team   in a hospital or clinic setting. Talk to your care team about the use of this medication in children. Special care may be needed. Overdosage: If you think you have taken too much of this medicine contact a poison control center or emergency room at once. NOTE: This medicine is only for you. Do not share this medicine with others. What if I miss a dose? Keep appointments for follow-up doses.  It is important not to miss your dose. Call your care team if you are unable to keep an appointment. What may interact with this medication? Capecitabine Fluorouracil Phenobarbital Phenytoin Primidone Trimethoprim;sulfamethoxazole This list may not describe all possible interactions. Give your health care provider a list of all the medicines, herbs, non-prescription drugs, or dietary supplements you use. Also tell them if you smoke, drink alcohol, or use illegal drugs. Some items may interact with your medicine. What should I watch for while using this medication? Your condition will be monitored carefully while you are receiving this medication. This medication may increase the side effects of 5-fluorouracil. Tell your care team if you have diarrhea or mouth sores that do not get better or that get worse. What side effects may I notice from receiving this medication? Side effects that you should report to your care team as soon as possible: Allergic reactions--skin rash, itching, hives, swelling of the face, lips, tongue, or throat This list may not describe all possible side effects. Call your doctor for medical advice about side effects. You may report side effects to FDA at 1-800-FDA-1088. Where should I keep my medication? This medication is given in a hospital or clinic. It will not be stored at home. NOTE: This sheet is a summary. It may not cover all possible information. If you have questions about this medicine, talk to your doctor, pharmacist, or health care provider.  2024 [employer] Standard (2021-07-07 00:00:00)  

## 2022-11-17 ENCOUNTER — Inpatient Hospital Stay: Payer: Medicare Other | Attending: Hematology and Oncology

## 2022-11-17 VITALS — BP 131/55 | HR 81 | Temp 97.7°F | Resp 18 | Ht 60.0 in | Wt 204.2 lb

## 2022-11-17 DIAGNOSIS — Z5112 Encounter for antineoplastic immunotherapy: Secondary | ICD-10-CM | POA: Insufficient documentation

## 2022-11-17 DIAGNOSIS — Z5111 Encounter for antineoplastic chemotherapy: Secondary | ICD-10-CM | POA: Diagnosis present

## 2022-11-17 DIAGNOSIS — E876 Hypokalemia: Secondary | ICD-10-CM | POA: Diagnosis not present

## 2022-11-17 DIAGNOSIS — Z79899 Other long term (current) drug therapy: Secondary | ICD-10-CM | POA: Diagnosis not present

## 2022-11-17 DIAGNOSIS — C169 Malignant neoplasm of stomach, unspecified: Secondary | ICD-10-CM | POA: Diagnosis not present

## 2022-11-17 DIAGNOSIS — Z7982 Long term (current) use of aspirin: Secondary | ICD-10-CM | POA: Diagnosis not present

## 2022-11-17 DIAGNOSIS — C168 Malignant neoplasm of overlapping sites of stomach: Secondary | ICD-10-CM

## 2022-11-17 MED ORDER — HEPARIN SOD (PORK) LOCK FLUSH 100 UNIT/ML IV SOLN
500.0000 [IU] | Freq: Once | INTRAVENOUS | Status: AC | PRN
  Administered 2022-11-17: 500 [IU]

## 2022-11-17 MED ORDER — SODIUM CHLORIDE 0.9 % IV SOLN: Freq: Once | INTRAVENOUS | Status: AC

## 2022-11-17 MED ORDER — SODIUM CHLORIDE 0.9% FLUSH
10.0000 mL | INTRAVENOUS | Status: DC | PRN
  Administered 2022-11-17: 10 mL

## 2022-11-17 NOTE — Patient Instructions (Signed)
IV Infusion Therapy IV infusion therapy is a type of treatment to deliver a liquid substance (infusion) directly into a vein through a small, thin tube (catheter). You may have IV infusion therapy to receive an infusion of: Fluids. Medicines. Nutrition. Chemotherapy. This is the use of medicines to stop or slow the growth of cancer cells. Blood or blood products. X-ray dye that is given before an imaging procedure, such as an MRI or a CT scan. Tell a health care provider about: Any allergies you have. All medicines you are taking, including vitamins, herbs, eye drops, creams, and over-the-counter medicines. Any problems you or family members have had with anesthetic medicines or X-ray dyes. Any blood disorders you have. Any surgeries you have had, including an axillary lymph node dissection and an arteriovenous fistula for dialysis. Any medical conditions you have. Whether you are pregnant or may be pregnant. Any history of IV drug use. Any history of health care providers not being able to find a vein for an IV or blood draw. What are the risks? Generally, this is a safe procedure. However, problems may occur, including: Pain or bruising. Bleeding. Infection. Failure to place the catheter due to inability to find a vein. Leaking or blockage of the catheter (infiltration). Damage to blood vessels or nerves. Allergic reactions to medicines or dyes. A blood clot. What happens before the procedure? Follow instructions from your health care provider about eating or drinking restrictions. Ask your health care provider about changing or stopping your regular medicines. This is especially important if you are taking diabetes medicines or blood thinners. Learn as much as you can about your treatment. Ask your health care provider for reliable resources, such as websites, books, videos, and people, to help you learn about the treatment you will be having. What happens during the procedure?      Placing the catheter IV infusion therapy starts with a procedure to place a catheter into a vein. An IV tube will be attached to the catheter to allow the infusion to flow into your bloodstream. Your catheter may be placed: Into a vein that is usually in the bend of the elbow, in the forearm, or in the back of the hand (peripheral IV catheter). This type of catheter may need to be inserted into a vein each time you get an infusion. Into a vein near your elbow (midline catheter or PICC). This type of catheter may stay in place for weeks or months at a time so you can receive repeated infusions through it. Into a vein near your neck that leads to your heart (non-tunneled catheter). This type of catheter is only used for short amounts of time because it can cause infection. Through the skin of your chest and into a large vein that leads to your heart (tunneled catheter). This type of catheter may stay in your body for months or years. So that it connects to an implanted port. An implanted port is a device that is surgically inserted under the skin of the chest to provide long-term IV access. The catheter will connect the port to a large vein in the chest or upper arm. A port may be kept in place for many months or years. Each time you have an infusion, a needle will be inserted through your skin to connect the catheter to the port. After your catheter is placed, your health care team will lower your risk of infection by washing the skin at the IV site with a germ-killing (antiseptic) solution.  Doing the infusion To start the infusion, your health care provider will: Attach the IV tubing to your catheter. Use a tape or an adhesive bandage (dressing) to hold the catheter and tubing in place against your skin. Use an IV pump to control the flow of the IV infusion, if needed. During the infusion, your health care provider will check the area to make sure: There is no swelling or pain. Your IV infusion  is flowing through the catheter properly. After the infusion, your health care provider will: Remove the dressing or tape. Disconnect the tubing from the catheter. Remove the catheter, if you have a peripheral IV. Apply pressure over the IV insertion site to stop bleeding, then cover the area with a dressing. If you have an implanted port, PICC, non-tunneled, or tunneled catheter, your health care team may leave the catheter in place. This depends on your treatment, your medical condition, and what type of catheter you have. The procedure may vary among health care providers and hospitals. What can I expect after the procedure? Your blood pressure, heart rate, breathing rate, and blood oxygen level may be monitored until you leave the hospital or clinic. Follow these instructions at home: Take over-the-counter and prescription medicines only as told by your health care provider. Change or remove any dressings only as told by your health care provider. Return to your normal activities as told by your health care provider. Ask your health care provider what activities are safe for you. Do not take baths, swim, or use a hot tub until your health care provider approves. Ask your health care provider if you may take showers. Check your IV insertion site every day for signs of infection. Check for: Redness, swelling, or pain. Fluid or blood. If fluid or blood drains from your IV site, use your hands to press down firmly on the area for a minute or two. Doing this should stop the bleeding. Warmth. Pus or a bad smell. Keep all follow-up visits. This is important. Contact a health care provider if: You have signs of infection around your IV site. You have a fever or chills. You have fluid or blood coming from your IV site that does not stop after you apply pressure to the site. You have itchy skin. You have a rash or blisters. You have itchy, red, swollen areas of skin (hives). Get help right away  if: You have trouble breathing. You have chest pain. These symptoms may represent a serious problem that is an emergency. Do not wait to see if the symptoms will go away. Get medical help right away. Call your local emergency services (911 in the [place] Do not drive yourself to the hospital. Summary IV infusion therapy is a type of treatment to deliver a liquid substance (infusion) directly into a vein. Check your IV insertion site every day for signs of infection, such as redness or swelling. Change or remove bandages (dressings) only as told by your health care provider. Call your health care provider if you notice any signs of infection around your IV site or have a rash or blisters. This information is not intended to replace advice given to you by your health care provider. Make sure you discuss any questions you have with your health care provider. Document Revised: 09/14/2019 Document Reviewed: 09/15/2019 Elsevier Patient Education  2024 [employer].

## 2022-11-18 ENCOUNTER — Encounter: Payer: Self-pay | Admitting: Oncology

## 2022-11-19 ENCOUNTER — Encounter: Payer: Self-pay | Admitting: Oncology

## 2022-11-22 ENCOUNTER — Encounter: Payer: Self-pay | Admitting: Oncology

## 2022-11-23 NOTE — Progress Notes (Signed)
[facility]  [address] [place],  [place]  [address] [phone]  Clinic Day:  11/24/2022  Referring physician: Philemon Kingdom, MD  ASSESSMENT & PLAN:   Assessment & Plan: Gastric cancer (HCC) Stage IVB (T4 N0 M1) poorly differentiated adenocarcinoma of the stomach with signet ring features and ulceration, metastatic to the omentum.  Stain for HER2 was negative.  PET scan revealed omental involvement, but no distant metastasis was seen. She is receiving palliative chemotherapy with FOLFOX/nivolumab (5-fluorouracil/oxaliplatin/nivolumab), which now consists of 5-fluorouracil/nivolumab.  Oxaliplatin was discontinued after 11 cycles.  PET revealed resolution of metabolic activity associated with the stomach.  Decrease in size of omental nodularity. No associated metabolic activity.   EGD in March revealed residual disease.  CT chest, abdomen and pelvis in July revealed stable disease with persistent mild gastric wall thickening and subtle adjacent omental nodularity.  She continues to tolerate treatment fairly well.  She receives extra IV fluids with her infusion to prevent recurrent acute kidney injury.  She will proceed with 25th cycle next week.  We will plan to see her back in 2 weeks with a CBC, comprehensive metabolic panel, TSH and CT chest, abdomen and pelvis to reassess her disease baseline prior to a 26th cycle.  Drug-induced interstitial nephritis History immune mediated nephritis from immunotherapy in February, which resolved with prednisone.  Her creatinine had increased to 1.43 weeks ago.  We have continued to give her IV fluids the week of her chemotherapy.  Her creatinine is back to baseline 1.3 this week.  Hypokalemia Her potassium remains normal on potassium chloride 20 mEq twice daily, which we will continue.  I sent in a refill of her medication for 90-day supply.      The patient and her daughter understand the plans discussed  today and are in agreement with them.  She will contact us if her diarrhea does not improve by the end of the week.  They know to contact our office if she develops other concerns prior to her next appointment.   I provided 15 minutes of face-to-face time during this encounter and > 50% was spent counseling as documented under my assessment and plan.    Adah Perl, PA-C  [facility] AT [facility] [address] [place] [address] Dept: [phone] Dept Fax: [phone]   No orders of the defined types were placed in this encounter.     CHIEF COMPLAINT:  CC: Stage IVB adenocarcinoma of the stomach  Current Treatment: Infusional 5-fluorouracil/leucovorin/nivolumab every 2 weeks  HISTORY OF PRESENT ILLNESS:  Mackenzie Lewis is a 87 y.o. female with stage IVB (T4b, N0, M1) gastric cancer diagnosed in July 2023.  She was referred by Dr. Shara Blazing for assessment and management.  She had noticed that she was having regurgitation when eating and had lost over 30 pounds.  An ultrasound was done, revealing hepatic steatosis, and led to an MRI scan in June, which revealed gastric wall thickening with confluent nodularity of the omentum anterior to the stomach measuring 3.5 cm consistent with metastatic tumor.  She also had low-grade edema and wall thickening extending into the duodenum from the stomach.  She was referred to Dr. Shara Blazing and he did an EGD in July.  This revealed a large ulceration measuring 1.2 cm along the greater curvature.  She also had diffusely edematous and erythematous wall with erosions of the antrum and stiff and friable mucosa with oozing of  blood.  These findings extended to the gastric fundus as well.  Pathology revealed a poorly differentiated adenocarcinoma with signet ring features from the biopsies of the ulcer as well as the antrum and the fundus of the stomach.  This is consistent with diffuse  involvement of the stomach suggestive of lienitis plastica.  She was placed on omeprazole.  Her test for H. pylori was negative.  She was referred to Dr. Hardin Negus for consideration of surgery, but he felt this was not resectable because of the extensive involvement.  We consider this extending to the duodenum and metastatic to the omentum.  PET scan confirmed these findings.  She wished to pursue systemic intravenous therapy.  CEA and CA 19-9 were normal. She has been receiving palliative FOLFOX/nivolumab, which now consists of 5-fluorouracil/leucovorin/nivolumab.  Oxaliplatin was discontinued after 11 cycles.  She has tolerated this fairly well.  PET in February revealed resolution of metabolic activity associated with the stomach. Decrease in size of omental nodularity without associated metabolic activity. No evidence of new or progressive gastric carcinoma. New peripheral consolidation in the RIGHT lower lobe with moderate metabolic activity. Favor focus of atelectasis versus less likely pneumonia. EGD in March revealed persistent disease.  CT chest, abdomen and pelvis in July revealed stable disease with persistent mild gastric wall thickening and subtle adjacent omental nodularity.    Oncology History  Gastric cancer (HCC)  09/10/2021 Initial Diagnosis   Gastric cancer (HCC)   09/10/2021 Cancer Staging   Staging form: Stomach, AJCC 8th Edition - Clinical stage from 09/10/2021: Stage IVB (cT4b, cN0, cM1) - Signed by Dellia Beckwith, MD on 09/10/2021 Histopathologic type: Adenocarcinoma, NOS Stage prefix: Initial diagnosis Total positive nodes: 0 Histologic grade (G): G3 Histologic grading system: 3 grade system Sites of metastasis: Peritoneal surface Diagnostic confirmation: Positive histology PLUS positive immunophenotyping and/or positive genetic studies Specimen type: Endoscopy with Biopsy Staged by: Managing physician Carcinoembryonic antigen (CEA) (ng/mL): 2.8 Carbohydrate  antigen 19-9 (CA 19-9) (U/mL): 4.9 HER2 status: Unknown Microsatellite instability (MSI): Unknown Tumor location in stomach: Other Clinical staging modalities: Biopsy, Endoscopy Stage used in treatment planning: Yes National guidelines used in treatment planning: Yes Type of national guideline used in treatment planning: NCCN   09/28/2021 - 10/14/2021 Chemotherapy   Patient is on Treatment Plan : GASTROESOPHAGEAL FOLFOX + Nivolumab q14d     09/28/2021 -  Chemotherapy   Patient is on Treatment Plan : GASTROESOPHAGEAL FOLFOX + Nivolumab q14d     12/09/2021 Genetic Testing   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  Report date is 12/09/2021.   The Multi-Cancer + RNA Panel offered by [facility] includes sequencing and/or deletion/duplication analysis of the following 84 genes:  AIP*, ALK, APC*, ATM*, AXIN2*, BAP1*, BARD1*, BLM*, BMPR1A*, BRCA1*, BRCA2*, BRIP1*, CASR, CDC73*, CDH1*, CDK4, CDKN1B*, CDKN1C*, CDKN2A, CEBPA, CHEK2*, CTNNA1*, DICER1*, DIS3L2*, EGFR, EPCAM, FH*, FLCN*, GATA2*, GPC3, GREM1, HOXB13, HRAS, KIT, MAX*, MEN1*, MET, MITF, MLH1*, MSH2*, MSH3*, MSH6*, MUTYH*, NBN*, NF1*, NF2*, NTHL1*, PALB2*, PDGFRA, PHOX2B, PMS2*, POLD1*, POLE*, POT1*, PRKAR1A*, PTCH1*, PTEN*, RAD50*, RAD51C*, RAD51D*, RB1*, RECQL4, RET, RUNX1*, SDHA*, SDHAF2*, SDHB*, SDHC*, SDHD*, SMAD4*, SMARCA4*, SMARCB1*, SMARCE1*, STK11*, SUFU*, TERC, TERT, TMEM127*, Tp53*, TSC1*, TSC2*, VHL*, WRN*, and WT1.  RNA analysis is performed for * genes.       INTERVAL HISTORY:  Mackenzie Lewis is here today for repeat clinical assessment prior to a 25th cycle of 5-fluorouracil/leucovorin/nivolumab.  She continues to tolerate this well, except for fatigue and neuropathy.  Her right foot seems to  be more painful.  She has occasional dizziness with lying down or sitting up quickly.  She denies headaches, vision changes paresthesias or focal weakness.  She denies diarrhea.  She denies shortness of  breath or cough.  She denies rashes.  She denies fevers or chills. She denies pain. Her appetite is good. Her weight has been stable.  She continues potassium chloride 20 mEq twice daily and requests a refill of that today.  REVIEW OF SYSTEMS:  Review of Systems  Constitutional:  Positive for fatigue. Negative for appetite change, chills, fever and unexpected weight change.  HENT:   Negative for lump/mass, mouth sores and sore throat.   Respiratory:  Negative for cough and shortness of breath.   Cardiovascular:  Negative for chest pain and leg swelling.  Gastrointestinal:  Negative for abdominal pain, constipation, diarrhea, nausea and vomiting.  Endocrine: Negative for hot flashes.  Genitourinary:  Negative for difficulty urinating, dysuria, frequency and hematuria.   Musculoskeletal:  Positive for gait problem (Walks with cane). Negative for arthralgias, back pain and myalgias.  Skin:  Negative for rash.  Neurological:  Positive for dizziness and gait problem (Walks with cane). Negative for headaches.  Hematological:  Negative for adenopathy. Does not bruise/bleed easily.  Psychiatric/Behavioral:  Negative for depression and sleep disturbance. The patient is not nervous/anxious.      VITALS:  Blood pressure (!) 152/70, pulse 88, temperature 98.6 F (37 C), temperature source Oral, resp. rate 18, height 5' (1.524 m), weight 201 lb 14.4 oz (91.6 kg), SpO2 95%.  Wt Readings from Last 3 Encounters:  11/24/22 201 lb 14.4 oz (91.6 kg)  11/17/22 204 lb 4 oz (92.6 kg)  11/10/22 200 lb (90.7 kg)    Body mass index is 39.43 kg/m.  Performance status (ECOG): 1 - Symptomatic but completely ambulatory  PHYSICAL EXAM:  Physical Exam Vitals and nursing note reviewed.  Constitutional:      General: She is not in acute distress.    Appearance: Normal appearance.  HENT:     Head: Normocephalic and atraumatic.     Mouth/Throat:     Mouth: Mucous membranes are moist.     Pharynx: Oropharynx is  clear. No oropharyngeal exudate or posterior oropharyngeal erythema.  Eyes:     General: No scleral icterus.    Extraocular Movements: Extraocular movements intact.     Conjunctiva/sclera: Conjunctivae normal.     Pupils: Pupils are equal, round, and reactive to light.  Cardiovascular:     Rate and Rhythm: Normal rate and regular rhythm.     Heart sounds: Normal heart sounds. No murmur heard.    No friction rub. No gallop.  Pulmonary:     Effort: Pulmonary effort is normal.     Breath sounds: Normal breath sounds. No wheezing, rhonchi or rales.  Abdominal:     General: There is no distension.     Palpations: Abdomen is soft. There is no hepatomegaly, splenomegaly or mass.     Tenderness: There is no abdominal tenderness.  Musculoskeletal:        General: Normal range of motion.     Cervical back: Normal range of motion and neck supple. No tenderness.     Right lower leg: No edema.     Left lower leg: No edema.  Lymphadenopathy:     Cervical: No cervical adenopathy.     Upper Body:     Right upper body: No supraclavicular or axillary adenopathy.     Left upper body: No supraclavicular or  axillary adenopathy.     Lower Body: No right inguinal adenopathy. No left inguinal adenopathy.  Skin:    General: Skin is warm and dry.     Coloration: Skin is not jaundiced.     Findings: No rash.  Neurological:     Mental Status: She is alert and oriented to person, place, and time.     Cranial Nerves: No cranial nerve deficit.  Psychiatric:        Mood and Affect: Mood normal.        Behavior: Behavior normal.        Thought Content: Thought content normal.     LABS:      Latest Ref Rng & Units 11/24/2022   12:00 AM 11/10/2022    9:34 AM 10/28/2022   12:00 AM  CBC  WBC  6.8     6.5  9.1      Hemoglobin 12.0 - 16.0 12.7     12.6  12.6      Hematocrit 36 - 46 36     37.5  37      Platelets 150 - 400 K/uL 195     185  211         This result is from an external source.       Latest Ref Rng & Units 11/24/2022   12:00 AM 11/10/2022    9:34 AM 10/28/2022   12:00 AM  CMP  Glucose 70 - 99 mg/dL  284    BUN 4 - 21 22     20  20       Creatinine 0.5 - 1.1 1.3     1.43  1.2      Sodium 137 - 147 138     137  135      Potassium 3.5 - 5.1 mEq/L 3.7     3.6  4.2      Chloride 99 - 108 109     103  105      CO2 13 - 22 24     23  24       Calcium 8.7 - 10.7 9.9     9.4  9.6      Total Protein 6.5 - 8.1 g/dL  7.1    Total Bilirubin 0.3 - 1.2 mg/dL  1.0    Alkaline Phos 25 - 125 70     67  69      AST 13 - 35 25     21  26       ALT 7 - 35 U/L 20     17  19          This result is from an external source.     Lab Results  Component Value Date   CEA1 2.9 09/10/2021   /  CEA  Date Value Ref Range Status  09/10/2021 2.9 0.0 - 4.7 ng/mL Final    Comment:    (NOTE)                             Nonsmokers          <3.9                             Smokers             <5.6 [employer] Electrochemiluminescence Immunoassay (ECLIA) Values obtained with different assay methods or kits cannot  be used interchangeably.  Results cannot be interpreted as absolute evidence of the presence or absence of malignant disease. Performed At: [facility] [address] [place], [place] [id number] Jolene Schimke MD WU:[id number]    No results found for: "PSA1" No results found for: "[phone]" No results found for: "CAN125"  No results found for: "TOTALPROTELP", "ALBUMINELP", "A1GS", "A2GS", "BETS", "BETA2SER", "GAMS", "MSPIKE", "SPEI" No results found for: "TIBC", "FERRITIN", "IRONPCTSAT" No results found for: "LDH"  STUDIES:  No results found.    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  If that happens, she is to call us so that we can schedule her for an ultrasound to look for synovitis.     Essential hypertension 09/09/2016   Gastric cancer (HCC) 09/10/2021   Hyperlipidemia 09/01/2016   Hypertension    Hypothyroidism 09/01/2016   Osteoarthritis of both feet 01/20/2016   Osteoarthritis, hand 01/20/2016   Thyroid disease     Past Surgical History:  Procedure Laterality Date   APPENDECTOMY     LAPAROSCOPIC APPENDECTOMY N/A 04/10/2016   Procedure: APPENDECTOMY LAPAROSCOPIC;  Surgeon: Abigail Miyamoto, MD;  Location: [facility] OR;  Service: General;  Laterality: N/A;    Family History  Problem Relation Age of Onset   Hypertension Mother    Prostate cancer Father        metastatic; d. 91   Brain cancer Sister 33   Breast cancer Sister 10   AAA (abdominal aortic aneurysm) Brother    Leukemia Cousin        x2 maternal female cousins; d. before 52   Breast cancer Daughter 10       DCIS    Social History:  reports that she has never smoked. She has never used smokeless tobacco. She reports that she does not currently use alcohol. She reports that she does not use drugs.The patient is accompanied by her boyfriend and her daughter today.  Allergies:  Allergies  Allergen Reactions   Doxycycline Rash   Sulfa Antibiotics Rash and Other (See Comments)    Other reaction(s): Other (See Comments)  "Made me feel weird"    Current Medications: Current Outpatient Medications  Medication Sig Dispense Refill   aspirin 81 MG EC tablet Take 81 mg by mouth daily. Swallow whole.     b complex vitamins capsule Take 1 capsule by mouth daily.     Calcium Carbonate (CALCIUM 600 PO) Take 1 tablet by mouth daily.      Cholecalciferol (VITAMIN D3) 5000 units CAPS Take 1 capsule by mouth daily.     EXFORGE HCT 5-160-12.5 MG TABS Take 1 tablet by mouth daily.     famotidine (PEPCID) 40 MG tablet Take 40 mg by mouth at bedtime.     KRILL OIL PO Take 1 capsule by mouth daily. Unknown strenght     Lactobacillus TABS Take 1 tablet by mouth 2 (two) times daily.     levothyroxine (SYNTHROID, LEVOTHROID) 75 MCG tablet Take 75 mcg by mouth  daily before breakfast.      NON FORMULARY MMW: 3 parts Maalox 2 parts Benadryl 1 part viscious lidicaine  Disp. 6oz  Instructions: 5ml swish and swallow every 3-4 hours     omeprazole (PRILOSEC) 40 MG capsule Take 1 capsule (40 mg total) by mouth 2 (two) times daily. 60 capsule 5   ondansetron (ZOFRAN-ODT) 4 MG disintegrating tablet Take 1 tablet (4 mg total) by mouth every 8 (eight) hours as needed for nausea or vomiting. 90 tablet 0   polyethylene glycol powder (GLYCOLAX/MIRALAX) 17 GM/SCOOP powder SMARTSIG:1 scoopful By Mouth Daily     potassium chloride SA (KLOR-CON M) 20 MEQ tablet Take 1 tablet (20 mEq total) by mouth 2 (two) times daily. 180 tablet 1   pravastatin (PRAVACHOL) 20 MG tablet Take 1 tablet (20 mg total) by mouth every evening. 90 tablet 3   Probiotic Product (PROBIOTIC DAILY PO) Take 1 tablet by mouth daily.     prochlorperazine (COMPAZINE) 10 MG tablet Take 1 tablet (10 mg total) by mouth every 6 (six) hours as needed for nausea or vomiting. 90 tablet 3   triamcinolone 0.1% oint-Eucerin equivalent cream 1:1 mixture Apply topically 2 (two) times daily. 160 g 0   No current facility-administered medications for this visit.

## 2022-11-24 ENCOUNTER — Inpatient Hospital Stay: Payer: Medicare Other

## 2022-11-24 ENCOUNTER — Encounter: Payer: Self-pay | Admitting: Hematology and Oncology

## 2022-11-24 ENCOUNTER — Inpatient Hospital Stay: Payer: Medicare Other | Admitting: Hematology and Oncology

## 2022-11-24 VITALS — BP 152/70 | HR 88 | Temp 98.6°F | Resp 18 | Ht 60.0 in | Wt 201.9 lb

## 2022-11-24 DIAGNOSIS — E876 Hypokalemia: Secondary | ICD-10-CM

## 2022-11-24 DIAGNOSIS — C168 Malignant neoplasm of overlapping sites of stomach: Secondary | ICD-10-CM

## 2022-11-24 DIAGNOSIS — Z79899 Other long term (current) drug therapy: Secondary | ICD-10-CM | POA: Diagnosis not present

## 2022-11-24 DIAGNOSIS — C169 Malignant neoplasm of stomach, unspecified: Secondary | ICD-10-CM | POA: Diagnosis not present

## 2022-11-24 DIAGNOSIS — N142 Nephropathy induced by unspecified drug, medicament or biological substance: Secondary | ICD-10-CM | POA: Diagnosis not present

## 2022-11-24 DIAGNOSIS — Z7982 Long term (current) use of aspirin: Secondary | ICD-10-CM | POA: Diagnosis not present

## 2022-11-24 DIAGNOSIS — Z5111 Encounter for antineoplastic chemotherapy: Secondary | ICD-10-CM | POA: Diagnosis not present

## 2022-11-24 DIAGNOSIS — R7401 Elevation of levels of liver transaminase levels: Secondary | ICD-10-CM | POA: Diagnosis not present

## 2022-11-24 DIAGNOSIS — D649 Anemia, unspecified: Secondary | ICD-10-CM | POA: Diagnosis not present

## 2022-11-24 DIAGNOSIS — Z5112 Encounter for antineoplastic immunotherapy: Secondary | ICD-10-CM | POA: Diagnosis not present

## 2022-11-24 LAB — BASIC METABOLIC PANEL
BUN: 22 — AB (ref 4–21)
CO2: 24 — AB (ref 13–22)
Chloride: 109 — AB (ref 99–108)
Creatinine: 1.3 — AB (ref 0.5–1.1)
Glucose: 134
Potassium: 3.7 meq/L (ref 3.5–5.1)
Sodium: 138 (ref 137–147)

## 2022-11-24 LAB — CBC AND DIFFERENTIAL
HCT: 36 (ref 36–46)
Hemoglobin: 12.7 (ref 12.0–16.0)
Neutrophils Absolute: 3.13
Platelets: 195 10*3/uL (ref 150–400)
WBC: 6.8

## 2022-11-24 LAB — HEPATIC FUNCTION PANEL
ALT: 20 U/L (ref 7–35)
AST: 25 (ref 13–35)
Alkaline Phosphatase: 70 (ref 25–125)
Bilirubin, Total: 0.7

## 2022-11-24 LAB — TSH: TSH: 3.689 u[IU]/mL (ref 0.350–4.500)

## 2022-11-24 LAB — COMPREHENSIVE METABOLIC PANEL
Albumin: 4.3 (ref 3.5–5.0)
Calcium: 9.9 (ref 8.7–10.7)
EGFR: 39

## 2022-11-24 LAB — CBC W DIFFERENTIAL (~~LOC~~ CC SCANNED REPORT)

## 2022-11-24 LAB — CBC: RBC: 4.04 (ref 3.87–5.11)

## 2022-11-24 LAB — COMPREHENSIVE METABOLIC PANEL (ASHBORO CC SCANNED REPORT)

## 2022-11-24 MED ORDER — POTASSIUM CHLORIDE CRYS ER 20 MEQ PO TBCR: 20.0000 meq | EXTENDED_RELEASE_TABLET | Freq: Two times a day (BID) | ORAL | 1 refills | Status: DC

## 2022-11-24 NOTE — Assessment & Plan Note (Signed)
Her potassium remains normal on potassium chloride 20 mEq twice daily, which we will continue.  I sent in a refill of her medication for 90-day supply.

## 2022-11-24 NOTE — Assessment & Plan Note (Deleted)
New finding of elevated AST of uncertain etiology.  She is asymptomatic.  I will add a hepatitis panel today.

## 2022-11-24 NOTE — Assessment & Plan Note (Addendum)
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  Her creatinine had increased to 1.43 weeks ago.  We have continued to give her IV fluids the week of her chemotherapy.  Her creatinine is back to baseline 1.3 this week.

## 2022-11-24 NOTE — Assessment & Plan Note (Signed)
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  Stain for HER2 was negative.  PET scan revealed omental involvement, but no distant metastasis was seen. She is receiving palliative chemotherapy with FOLFOX/nivolumab (5-fluorouracil/oxaliplatin/nivolumab), which now consists of 5-fluorouracil/nivolumab.  Oxaliplatin was discontinued after 11 cycles.  PET revealed resolution of metabolic activity associated with the stomach.  Decrease in size of omental nodularity. No associated metabolic activity.   EGD in March revealed residual disease.  CT chest, abdomen and pelvis in July revealed stable disease with persistent mild gastric wall thickening and subtle adjacent omental nodularity.  She continues to tolerate treatment fairly well.  She receives extra IV fluids with her infusion to prevent recurrent acute kidney injury.  She will proceed with 25th cycle next week.  We will plan to see her back in 2 weeks with a CBC, comprehensive metabolic panel, TSH and CT chest, abdomen and pelvis to reassess her disease baseline prior to a 26th cycle.

## 2022-11-25 LAB — T4: T4, Total: 10.6 ug/dL (ref 4.5–12.0)

## 2022-11-26 ENCOUNTER — Ambulatory Visit: Payer: Medicare Other | Admitting: Podiatry

## 2022-11-26 MED FILL — Fluorouracil IV Soln 5 GM/100ML (50 MG/ML): INTRAVENOUS | Qty: 77 | Status: AC

## 2022-11-26 MED FILL — Fluorouracil IV Soln 2.5 GM/50ML (50 MG/ML): INTRAVENOUS | Qty: 13 | Status: AC

## 2022-11-26 MED FILL — Nivolumab IV Soln 240 MG/24ML: INTRAVENOUS | Qty: 24 | Status: AC

## 2022-11-26 MED FILL — Leucovorin Calcium For Inj 350 MG: INTRAMUSCULAR | Qty: 31.5 | Status: AC

## 2022-11-26 MED FILL — Dexamethasone Sodium Phosphate Inj 100 MG/10ML: INTRAMUSCULAR | Qty: 1 | Status: AC

## 2022-11-29 ENCOUNTER — Inpatient Hospital Stay: Payer: Medicare Other

## 2022-11-29 VITALS — BP 152/71 | HR 84 | Temp 98.4°F | Resp 18 | Ht 60.0 in | Wt 202.0 lb

## 2022-11-29 DIAGNOSIS — Z5112 Encounter for antineoplastic immunotherapy: Secondary | ICD-10-CM | POA: Diagnosis not present

## 2022-11-29 DIAGNOSIS — E876 Hypokalemia: Secondary | ICD-10-CM | POA: Diagnosis not present

## 2022-11-29 DIAGNOSIS — C168 Malignant neoplasm of overlapping sites of stomach: Secondary | ICD-10-CM

## 2022-11-29 DIAGNOSIS — C169 Malignant neoplasm of stomach, unspecified: Secondary | ICD-10-CM | POA: Diagnosis not present

## 2022-11-29 DIAGNOSIS — Z79899 Other long term (current) drug therapy: Secondary | ICD-10-CM | POA: Diagnosis not present

## 2022-11-29 DIAGNOSIS — Z5111 Encounter for antineoplastic chemotherapy: Secondary | ICD-10-CM | POA: Diagnosis not present

## 2022-11-29 DIAGNOSIS — Z7982 Long term (current) use of aspirin: Secondary | ICD-10-CM | POA: Diagnosis not present

## 2022-11-29 MED ORDER — SODIUM CHLORIDE 0.9 % IV SOLN: Freq: Once | INTRAVENOUS | Status: AC

## 2022-11-29 MED ORDER — PALONOSETRON HCL INJECTION 0.25 MG/5ML
0.2500 mg | Freq: Once | INTRAVENOUS | Status: AC
  Administered 2022-11-29: 0.25 mg via INTRAVENOUS
  Filled 2022-11-29: qty 5

## 2022-11-29 MED ORDER — FLUOROURACIL CHEMO INJECTION 2.5 GM/50ML
320.0000 mg/m2 | Freq: Once | INTRAVENOUS | Status: AC
  Administered 2022-11-29: 650 mg via INTRAVENOUS
  Filled 2022-11-29: qty 13

## 2022-11-29 MED ORDER — SODIUM CHLORIDE 0.9 % IV SOLN
240.0000 mg | Freq: Once | INTRAVENOUS | Status: AC
  Administered 2022-11-29: 240 mg via INTRAVENOUS
  Filled 2022-11-29: qty 24

## 2022-11-29 MED ORDER — DEXTROSE 5 % IV SOLN: Freq: Once | INTRAVENOUS | Status: AC

## 2022-11-29 MED ORDER — SODIUM CHLORIDE 0.9 % IV SOLN
1960.0000 mg/m2 | INTRAVENOUS | Status: DC
  Administered 2022-11-29: 3850 mg via INTRAVENOUS
  Filled 2022-11-29: qty 77

## 2022-11-29 MED ORDER — LEUCOVORIN CALCIUM INJECTION 350 MG
320.0000 mg/m2 | Freq: Once | INTRAVENOUS | Status: AC
  Administered 2022-11-29: 630 mg via INTRAVENOUS
  Filled 2022-11-29: qty 31.5

## 2022-11-29 MED ORDER — SODIUM CHLORIDE 0.9 % IV SOLN
10.0000 mg | Freq: Once | INTRAVENOUS | Status: AC
  Administered 2022-11-29: 10 mg via INTRAVENOUS
  Filled 2022-11-29: qty 10

## 2022-11-29 NOTE — Patient Instructions (Signed)
Fluorouracil Injection What is this medication? FLUOROURACIL (flure oh YOOR a sil) treats some types of cancer. It works by slowing down the growth of cancer cells. This medicine may be used for other purposes; ask your health care provider or pharmacist if you have questions. COMMON BRAND NAME(S): Adrucil What should I tell my care team before I take this medication? They need to know if you have any of these conditions: Blood disorders Dihydropyrimidine dehydrogenase (DPD) deficiency Infection, such as chickenpox, cold sores, herpes Kidney disease Liver disease Poor nutrition Recent or ongoing radiation therapy An unusual or allergic reaction to fluorouracil, other medications, foods, dyes, or preservatives If you or your partner are pregnant or trying to get pregnant Breast-feeding How should I use this medication? This medication is injected into a vein. It is administered by your care team in a hospital or clinic setting. Talk to your care team about the use of this medication in children. Special care may be needed. Overdosage: If you think you have taken too much of this medicine contact a poison control center or emergency room at once. NOTE: This medicine is only for you. Do not share this medicine with others. What if I miss a dose? Keep appointments for follow-up doses. It is important not to miss your dose. Call your care team if you are unable to keep an appointment. What may interact with this medication? Do not take this medication with any of the following: Live virus vaccines This medication may also interact with the following: Medications that treat or prevent blood clots, such as warfarin, enoxaparin, dalteparin This list may not describe all possible interactions. Give your health care provider a list of all the medicines, herbs, non-prescription drugs, or dietary supplements you use. Also tell them if you smoke, drink alcohol, or use illegal drugs. Some items may  interact with your medicine. What should I watch for while using this medication? Your condition will be monitored carefully while you are receiving this medication. This medication may make you feel generally unwell. This is not uncommon as chemotherapy can affect healthy cells as well as cancer cells. Report any side effects. Continue your course of treatment even though you feel ill unless your care team tells you to stop. In some cases, you may be given additional medications to help with side effects. Follow all directions for their use. This medication may increase your risk of getting an infection. Call your care team for advice if you get a fever, chills, sore throat, or other symptoms of a cold or flu. Do not treat yourself. Try to avoid being around people who are sick. This medication may increase your risk to bruise or bleed. Call your care team if you notice any unusual bleeding. Be careful brushing or flossing your teeth or using a toothpick because you may get an infection or bleed more easily. If you have any dental work done, tell your dentist you are receiving this medication. Avoid taking medications that contain aspirin, acetaminophen, ibuprofen, naproxen, or ketoprofen unless instructed by your care team. These medications may hide a fever. Do not treat diarrhea with over the counter products. Contact your care team if you have diarrhea that lasts more than 2 days or if it is severe and watery. This medication can make you more sensitive to the sun. Keep out of the sun. If you cannot avoid being in the sun, wear protective clothing and sunscreen. Do not use sun lamps, tanning beds, or tanning booths. Talk to  your care team if you or your partner wish to become pregnant or think you might be pregnant. This medication can cause serious birth defects if taken during pregnancy and for 3 months after the last dose. A reliable form of contraception is recommended while taking this  medication and for 3 months after the last dose. Talk to your care team about effective forms of contraception. Do not father a child while taking this medication and for 3 months after the last dose. Use a condom while having sex during this time period. Do not breastfeed while taking this medication. This medication may cause infertility. Talk to your care team if you are concerned about your fertility. What side effects may I notice from receiving this medication? Side effects that you should report to your care team as soon as possible: Allergic reactions--skin rash, itching, hives, swelling of the face, lips, tongue, or throat Heart attack--pain or tightness in the chest, shoulders, arms, or jaw, nausea, shortness of breath, cold or clammy skin, feeling faint or lightheaded Heart failure--shortness of breath, swelling of the ankles, feet, or hands, sudden weight gain, unusual weakness or fatigue Heart rhythm changes--fast or irregular heartbeat, dizziness, feeling faint or lightheaded, chest pain, trouble breathing High ammonia level--unusual weakness or fatigue, confusion, loss of appetite, nausea, vomiting, seizures Infection--fever, chills, cough, sore throat, wounds that don't heal, pain or trouble when passing urine, general feeling of discomfort or being unwell Low red blood cell level--unusual weakness or fatigue, dizziness, headache, trouble breathing Pain, tingling, or numbness in the hands or feet, muscle weakness, change in vision, confusion or trouble speaking, loss of balance or coordination, trouble walking, seizures Redness, swelling, and blistering of the skin over hands and feet Severe or prolonged diarrhea Unusual bruising or bleeding Side effects that usually do not require medical attention (report to your care team if they continue or are bothersome): Dry skin Headache Increased tears Nausea Pain, redness, or swelling with sores inside the mouth or throat Sensitivity  to light Vomiting This list may not describe all possible side effects. Call your doctor for medical advice about side effects. You may report side effects to FDA at 1-800-FDA-1088. Where should I keep my medication? This medication is given in a hospital or clinic. It will not be stored at home. NOTE: This sheet is a summary. It may not cover all possible information. If you have questions about this medicine, talk to your doctor, pharmacist, or health care provider.  2024 [employer] Standard (2021-06-09 00:00:00) Leucovorin Injection What is this medication? LEUCOVORIN (loo koe VOR in) prevents side effects from certain medications, such as methotrexate. It works by increasing folate levels. This helps protect healthy cells in your body. It may also be used to treat anemia caused by low levels of folate. It can also be used with fluorouracil, a type of chemotherapy, to treat colorectal cancer. It works by increasing the effects of fluorouracil in the body. This medicine may be used for other purposes; ask your health care provider or pharmacist if you have questions. What should I tell my care team before I take this medication? They need to know if you have any of these conditions: Anemia from low levels of vitamin B12 in the blood An unusual or allergic reaction to leucovorin, folic acid, other medications, foods, dyes, or preservatives Pregnant or trying to get pregnant Breastfeeding How should I use this medication? This medication is injected into a vein or a muscle. It is given by your care team  in a hospital or clinic setting. Talk to your care team about the use of this medication in children. Special care may be needed. Overdosage: If you think you have taken too much of this medicine contact a poison control center or emergency room at once. NOTE: This medicine is only for you. Do not share this medicine with others. What if I miss a dose? Keep appointments for follow-up doses.  It is important not to miss your dose. Call your care team if you are unable to keep an appointment. What may interact with this medication? Capecitabine Fluorouracil Phenobarbital Phenytoin Primidone Trimethoprim;sulfamethoxazole This list may not describe all possible interactions. Give your health care provider a list of all the medicines, herbs, non-prescription drugs, or dietary supplements you use. Also tell them if you smoke, drink alcohol, or use illegal drugs. Some items may interact with your medicine. What should I watch for while using this medication? Your condition will be monitored carefully while you are receiving this medication. This medication may increase the side effects of 5-fluorouracil. Tell your care team if you have diarrhea or mouth sores that do not get better or that get worse. What side effects may I notice from receiving this medication? Side effects that you should report to your care team as soon as possible: Allergic reactions--skin rash, itching, hives, swelling of the face, lips, tongue, or throat This list may not describe all possible side effects. Call your doctor for medical advice about side effects. You may report side effects to FDA at 1-800-FDA-1088. Where should I keep my medication? This medication is given in a hospital or clinic. It will not be stored at home. NOTE: This sheet is a summary. It may not cover all possible information. If you have questions about this medicine, talk to your doctor, pharmacist, or health care provider.  2024 [employer] Standard (2021-07-07 00:00:00)

## 2022-12-01 ENCOUNTER — Inpatient Hospital Stay: Payer: Medicare Other

## 2022-12-01 VITALS — BP 136/70 | HR 90 | Temp 98.2°F | Resp 18 | Ht 60.0 in | Wt 205.0 lb

## 2022-12-01 DIAGNOSIS — Z79899 Other long term (current) drug therapy: Secondary | ICD-10-CM | POA: Diagnosis not present

## 2022-12-01 DIAGNOSIS — C169 Malignant neoplasm of stomach, unspecified: Secondary | ICD-10-CM | POA: Diagnosis not present

## 2022-12-01 DIAGNOSIS — Z7982 Long term (current) use of aspirin: Secondary | ICD-10-CM | POA: Diagnosis not present

## 2022-12-01 DIAGNOSIS — Z5112 Encounter for antineoplastic immunotherapy: Secondary | ICD-10-CM | POA: Diagnosis not present

## 2022-12-01 DIAGNOSIS — E876 Hypokalemia: Secondary | ICD-10-CM | POA: Diagnosis not present

## 2022-12-01 DIAGNOSIS — C168 Malignant neoplasm of overlapping sites of stomach: Secondary | ICD-10-CM

## 2022-12-01 DIAGNOSIS — Z5111 Encounter for antineoplastic chemotherapy: Secondary | ICD-10-CM | POA: Diagnosis not present

## 2022-12-01 MED ORDER — SODIUM CHLORIDE 0.9% FLUSH
10.0000 mL | INTRAVENOUS | Status: DC | PRN
  Administered 2022-12-01: 10 mL

## 2022-12-01 MED ORDER — SODIUM CHLORIDE 0.9 % IV SOLN: Freq: Once | INTRAVENOUS | Status: AC

## 2022-12-01 MED ORDER — HEPARIN SOD (PORK) LOCK FLUSH 100 UNIT/ML IV SOLN
500.0000 [IU] | Freq: Once | INTRAVENOUS | Status: AC | PRN
  Administered 2022-12-01: 500 [IU]

## 2022-12-01 NOTE — Patient Instructions (Signed)
IV Infusion Therapy IV infusion therapy is a type of treatment to deliver a liquid substance (infusion) directly into a vein through a small, thin tube (catheter). You may have IV infusion therapy to receive an infusion of: Fluids. Medicines. Nutrition. Chemotherapy. This is the use of medicines to stop or slow the growth of cancer cells. Blood or blood products. X-ray dye that is given before an imaging procedure, such as an MRI or a CT scan. Tell a health care provider about: Any allergies you have. All medicines you are taking, including vitamins, herbs, eye drops, creams, and over-the-counter medicines. Any problems you or family members have had with anesthetic medicines or X-ray dyes. Any blood disorders you have. Any surgeries you have had, including an axillary lymph node dissection and an arteriovenous fistula for dialysis. Any medical conditions you have. Whether you are pregnant or may be pregnant. Any history of IV drug use. Any history of health care providers not being able to find a vein for an IV or blood draw. What are the risks? Generally, this is a safe procedure. However, problems may occur, including: Pain or bruising. Bleeding. Infection. Failure to place the catheter due to inability to find a vein. Leaking or blockage of the catheter (infiltration). Damage to blood vessels or nerves. Allergic reactions to medicines or dyes. A blood clot. What happens before the procedure? Follow instructions from your health care provider about eating or drinking restrictions. Ask your health care provider about changing or stopping your regular medicines. This is especially important if you are taking diabetes medicines or blood thinners. Learn as much as you can about your treatment. Ask your health care provider for reliable resources, such as websites, books, videos, and people, to help you learn about the treatment you will be having. What happens during the procedure?      Placing the catheter IV infusion therapy starts with a procedure to place a catheter into a vein. An IV tube will be attached to the catheter to allow the infusion to flow into your bloodstream. Your catheter may be placed: Into a vein that is usually in the bend of the elbow, in the forearm, or in the back of the hand (peripheral IV catheter). This type of catheter may need to be inserted into a vein each time you get an infusion. Into a vein near your elbow (midline catheter or PICC). This type of catheter may stay in place for weeks or months at a time so you can receive repeated infusions through it. Into a vein near your neck that leads to your heart (non-tunneled catheter). This type of catheter is only used for short amounts of time because it can cause infection. Through the skin of your chest and into a large vein that leads to your heart (tunneled catheter). This type of catheter may stay in your body for months or years. So that it connects to an implanted port. An implanted port is a device that is surgically inserted under the skin of the chest to provide long-term IV access. The catheter will connect the port to a large vein in the chest or upper arm. A port may be kept in place for many months or years. Each time you have an infusion, a needle will be inserted through your skin to connect the catheter to the port. After your catheter is placed, your health care team will lower your risk of infection by washing the skin at the IV site with a germ-killing (antiseptic) solution.  Doing the infusion To start the infusion, your health care provider will: Attach the IV tubing to your catheter. Use a tape or an adhesive bandage (dressing) to hold the catheter and tubing in place against your skin. Use an IV pump to control the flow of the IV infusion, if needed. During the infusion, your health care provider will check the area to make sure: There is no swelling or pain. Your IV infusion  is flowing through the catheter properly. After the infusion, your health care provider will: Remove the dressing or tape. Disconnect the tubing from the catheter. Remove the catheter, if you have a peripheral IV. Apply pressure over the IV insertion site to stop bleeding, then cover the area with a dressing. If you have an implanted port, PICC, non-tunneled, or tunneled catheter, your health care team may leave the catheter in place. This depends on your treatment, your medical condition, and what type of catheter you have. The procedure may vary among health care providers and hospitals. What can I expect after the procedure? Your blood pressure, heart rate, breathing rate, and blood oxygen level may be monitored until you leave the hospital or clinic. Follow these instructions at home: Take over-the-counter and prescription medicines only as told by your health care provider. Change or remove any dressings only as told by your health care provider. Return to your normal activities as told by your health care provider. Ask your health care provider what activities are safe for you. Do not take baths, swim, or use a hot tub until your health care provider approves. Ask your health care provider if you may take showers. Check your IV insertion site every day for signs of infection. Check for: Redness, swelling, or pain. Fluid or blood. If fluid or blood drains from your IV site, use your hands to press down firmly on the area for a minute or two. Doing this should stop the bleeding. Warmth. Pus or a bad smell. Keep all follow-up visits. This is important. Contact a health care provider if: You have signs of infection around your IV site. You have a fever or chills. You have fluid or blood coming from your IV site that does not stop after you apply pressure to the site. You have itchy skin. You have a rash or blisters. You have itchy, red, swollen areas of skin (hives). Get help right away  if: You have trouble breathing. You have chest pain. These symptoms may represent a serious problem that is an emergency. Do not wait to see if the symptoms will go away. Get medical help right away. Call your local emergency services (911 in the [place] Do not drive yourself to the hospital. Summary IV infusion therapy is a type of treatment to deliver a liquid substance (infusion) directly into a vein. Check your IV insertion site every day for signs of infection, such as redness or swelling. Change or remove bandages (dressings) only as told by your health care provider. Call your health care provider if you notice any signs of infection around your IV site or have a rash or blisters. This information is not intended to replace advice given to you by your health care provider. Make sure you discuss any questions you have with your health care provider. Document Revised: 09/14/2019 Document Reviewed: 09/15/2019 Elsevier Patient Education  2024 [employer].

## 2022-12-03 ENCOUNTER — Encounter: Payer: Self-pay | Admitting: Oncology

## 2022-12-06 DIAGNOSIS — J8410 Pulmonary fibrosis, unspecified: Secondary | ICD-10-CM | POA: Diagnosis not present

## 2022-12-06 DIAGNOSIS — C168 Malignant neoplasm of overlapping sites of stomach: Secondary | ICD-10-CM | POA: Diagnosis not present

## 2022-12-06 DIAGNOSIS — C169 Malignant neoplasm of stomach, unspecified: Secondary | ICD-10-CM | POA: Diagnosis not present

## 2022-12-06 DIAGNOSIS — K760 Fatty (change of) liver, not elsewhere classified: Secondary | ICD-10-CM | POA: Diagnosis not present

## 2022-12-06 DIAGNOSIS — K828 Other specified diseases of gallbladder: Secondary | ICD-10-CM | POA: Diagnosis not present

## 2022-12-06 DIAGNOSIS — J479 Bronchiectasis, uncomplicated: Secondary | ICD-10-CM | POA: Diagnosis not present

## 2022-12-06 LAB — CBC AND DIFFERENTIAL
HCT: 37 (ref 36–46)
Hemoglobin: 12.9 (ref 12.0–16.0)
Neutrophils Absolute: 2.44
Platelets: 165 10*3/uL (ref 150–400)
WBC: 5.2

## 2022-12-06 LAB — HEPATIC FUNCTION PANEL
ALT: 20 U/L (ref 7–35)
AST: 24 (ref 13–35)
Alkaline Phosphatase: 72 (ref 25–125)
Bilirubin, Total: 0.8

## 2022-12-06 LAB — BASIC METABOLIC PANEL
BUN: 21 (ref 4–21)
CO2: 28 — AB (ref 13–22)
Chloride: 103 (ref 99–108)
Creatinine: 1.3 — AB (ref 0.5–1.1)
Glucose: 129
Potassium: 3.8 meq/L (ref 3.5–5.1)
Sodium: 137 (ref 137–147)

## 2022-12-06 LAB — COMPREHENSIVE METABOLIC PANEL
Albumin: 4.2 (ref 3.5–5.0)
Calcium: 9.4 (ref 8.7–10.7)
EGFR: 39

## 2022-12-06 LAB — CBC: RBC: 4.17 (ref 3.87–5.11)

## 2022-12-06 LAB — TSH: TSH: 1.97 (ref 0.41–5.90)

## 2022-12-07 NOTE — Progress Notes (Signed)
[facility]  [address] [place],  [place]  [address] [phone]  Clinic Day: 12/09/22  Referring physician: Philemon Kingdom, MD  ASSESSMENT & PLAN:  Assessment & Plan: Gastric cancer (HCC) Stage IVB (T4 N0 M1) poorly differentiated adenocarcinoma of the stomach with signet ring features and ulceration, metastatic to the omentum.  Stain for HER2 was negative.  PET scan revealed omental involvement, but no distant metastasis was seen. She is receiving palliative chemotherapy with FOLFOX/nivolumab (5-fluorouracil/oxaliplatin/nivolumab), which now consists of 5-fluorouracil/nivolumab.  Oxaliplatin was discontinued after 11 cycles.  PET revealed resolution of metabolic activity associated with the stomach.  Decrease in size of omental nodularity. No associated metabolic activity.   EGD in March revealed residual disease. CT chest, abdomen and pelvis in July revealed stable disease with persistent mild gastric wall thickening and subtle adjacent omental nodularity.  CT scan done on 12/06/2022 that revealed persistent wall thickening along the distal stomach with the adjacent stranding and soft tissue thickening, no additional areas of peritoneal nodularity or developing lymph node enlargement, no liver lesion and fatty liver infiltration. Once again changes interstitial lung disease with fibrosis, bronchiectasis and a stable slight asymmetric thickening along the gallbladder but the gallbladder is under-distended. Her disease remains stable with treatment but still persists, in view of this I have recommended we continue the maintenance treatment every 2 weeks and re-scan in 3 months.   Drug-induced interstitial nephritis History immune mediated nephritis from immunotherapy in February, which resolved with prednisone.  Her creatinine had increased to 1.43 weeks ago.  We have continued to give her IV fluids the week of her chemotherapy.  Her creatinine is stable at  1.3 this week.   Hypokalemia Her potassium remains normal on potassium chloride 20 mEq twice daily, which we will continue.    Plan:  She had a CT scan done on 12/06/2022 that revealed persistent wall thickening along the distal stomach with the adjacent stranding and soft tissue thickening, no additional areas of peritoneal nodularity or developing lymph node enlargement, no liver lesion and fatty liver infiltration. Once again she has changes of interstitial lung disease with fibrosis, bronchiectasis and a stable slight asymmetric thickening along the gallbladder but the gallbladder is under-distended. Her disease remains stable with treatment but still persists, in view of this I have recommended we continue the maintenance treatment every 2 weeks and re-scan in 3 months. She informed me after her last treatment she ran a fever around 99.7 and this was resolved with Tylenol. Her daughter has noticed that she has more longer lasting recurrent fevers now and does not seem to bounce back from her treatments as well now. Her day 1 cycle 26 of FOLFOX and Nivolumab (without Oxaliplatin) is scheduled on 12/13/2022. Her daughter asked if she can delay the week of Thanksgiving. We will postpone her cycle 28th from November 25 to December 2nd.  Imogine informed me about the numbness/pain of her feet. I informed her about Gabapentin and its benefits and risks. I will start her on Gabapentin 100mg  to take at bedtime and then increase the dose after 2 weeks to 200mg  at bedtime. Her WBC is 5.2, hemoglobin is 12.9, and platelet count in 165,000 as of 12/06/2022. Her free T4 is normal at 1.81 and her CMP is normal other than a elevated creatinine of 1.30 with a BUN of 21. I will send copies of reports to Dr. Sudie Bailey. [name] back in 2 weeks with CBC and CMP. The patient  and her daughter understand the plans discussed today and are in agreement with them.  She will contact us if her diarrhea does not improve by the  end of the week.  They know to contact our office if she develops other concerns prior to her next appointment.  I provided 30 minutes of face-to-face time during this encounter and > 50% was spent counseling as documented under my assessment and plan.    Dellia Beckwith, MD  [facility] [address] [place] [address] Dept: [phone] Dept Fax: [phone]   No orders of the defined types were placed in this encounter.     CHIEF COMPLAINT:  CC: Stage IVB adenocarcinoma of the stomach  Current Treatment: Infusional 5-fluorouracil/leucovorin/nivolumab every 2 weeks  HISTORY OF PRESENT ILLNESS:  Mackenzie Lewis is a 87 y.o. female with stage IVB (T4b, N0, M1) gastric cancer diagnosed in July 2023.  She was referred by Dr. Shara Blazing for assessment and management.  She had noticed that she was having regurgitation when eating and had lost over 30 pounds.  An ultrasound was done, revealing hepatic steatosis, and led to an MRI scan in June, which revealed gastric wall thickening with confluent nodularity of the omentum anterior to the stomach measuring 3.5 cm consistent with metastatic tumor.  She also had low-grade edema and wall thickening extending into the duodenum from the stomach.  She was referred to Dr. Shara Blazing and he did an EGD in July.  This revealed a large ulceration measuring 1.2 cm along the greater curvature.  She also had diffusely edematous and erythematous wall with erosions of the antrum and stiff and friable mucosa with oozing of blood.  These findings extended to the gastric fundus as well.  Pathology revealed a poorly differentiated adenocarcinoma with signet ring features from the biopsies of the ulcer as well as the antrum and the fundus of the stomach.  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  She was placed on omeprazole.  Her test for  H. pylori was negative.  She was referred to Dr. Hardin Negus for consideration of surgery, but he felt this was not resectable because of the extensive involvement.  We consider this extending to the duodenum and metastatic to the omentum.  PET scan confirmed these findings.  She wished to pursue systemic intravenous therapy.  CEA and CA 19-9 were normal. She has been receiving palliative FOLFOX/nivolumab, which now consists of 5-fluorouracil/leucovorin/nivolumab.  Oxaliplatin was discontinued after 11 cycles.  She has tolerated this fairly well.  PET in February revealed resolution of metabolic activity associated with the stomach. Decrease in size of omental nodularity without associated metabolic activity. No evidence of new or progressive gastric carcinoma. New peripheral consolidation in the RIGHT lower lobe with moderate metabolic activity. Favor focus of atelectasis versus less likely pneumonia. EGD in March revealed persistent disease.  CT chest, abdomen and pelvis in July revealed stable disease with persistent mild gastric wall thickening and subtle adjacent omental nodularity.    Oncology History  Gastric cancer (HCC)  09/10/2021 Initial Diagnosis   Gastric cancer (HCC)   09/10/2021 Cancer Staging   Staging form: Stomach, AJCC 8th Edition - Clinical stage from 09/10/2021: Stage IVB (cT4b, cN0, cM1) - Signed by Dellia Beckwith, MD on 09/10/2021 Histopathologic type: Adenocarcinoma, NOS Stage prefix: Initial diagnosis Total positive nodes: 0 Histologic grade (G): G3 Histologic grading system: 3 grade system Sites  of metastasis: Peritoneal surface Diagnostic confirmation: Positive histology PLUS positive immunophenotyping and/or positive genetic studies Specimen type: Endoscopy with Biopsy Staged by: Managing physician Carcinoembryonic antigen (CEA) (ng/mL): 2.8 Carbohydrate antigen 19-9 (CA 19-9) (U/mL): 4.9 HER2 status: Unknown Microsatellite instability (MSI): Unknown Tumor  location in stomach: Other Clinical staging modalities: Biopsy, Endoscopy Stage used in treatment planning: Yes National guidelines used in treatment planning: Yes Type of national guideline used in treatment planning: NCCN   09/28/2021 - 10/14/2021 Chemotherapy   Patient is on Treatment Plan : GASTROESOPHAGEAL FOLFOX + Nivolumab q14d     09/28/2021 -  Chemotherapy   Patient is on Treatment Plan : GASTROESOPHAGEAL FOLFOX + Nivolumab q14d     12/09/2021 Genetic Testing   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  Report date is 12/09/2021.   The Multi-Cancer + RNA Panel offered by [facility] includes sequencing and/or deletion/duplication analysis of the following 84 genes:  AIP*, ALK, APC*, ATM*, AXIN2*, BAP1*, BARD1*, BLM*, BMPR1A*, BRCA1*, BRCA2*, BRIP1*, CASR, CDC73*, CDH1*, CDK4, CDKN1B*, CDKN1C*, CDKN2A, CEBPA, CHEK2*, CTNNA1*, DICER1*, DIS3L2*, EGFR, EPCAM, FH*, FLCN*, GATA2*, GPC3, GREM1, HOXB13, HRAS, KIT, MAX*, MEN1*, MET, MITF, MLH1*, MSH2*, MSH3*, MSH6*, MUTYH*, NBN*, NF1*, NF2*, NTHL1*, PALB2*, PDGFRA, PHOX2B, PMS2*, POLD1*, POLE*, POT1*, PRKAR1A*, PTCH1*, PTEN*, RAD50*, RAD51C*, RAD51D*, RB1*, RECQL4, RET, RUNX1*, SDHA*, SDHAF2*, SDHB*, SDHC*, SDHD*, SMAD4*, SMARCA4*, SMARCB1*, SMARCE1*, STK11*, SUFU*, TERC, TERT, TMEM127*, Tp53*, TSC1*, TSC2*, VHL*, WRN*, and WT1.  RNA analysis is performed for * genes.       INTERVAL HISTORY:  Esmerelda is here today for a follow-up for her gastric cancer initially treated with FOLFOX. She continues maintenance treatment with 5FU, Leucovorin, and Nivolumab. Patient states that she feels well but complains of constipation and shoulder and left foot pain. She had a CT scan done on 12/06/2022 that revealed persistent wall thickening along the distal stomach with the adjacent stranding and soft tissue thickening, no additional areas of peritoneal nodularity or developing lymph node enlargement, no liver lesion and  fatty liver infiltration. Once again she has changes of interstitial lung disease with fibrosis, bronchiectasis and a stable slight asymmetric thickening along the gallbladder but the gallbladder is under-distended. Her disease remains stable with treatment but still persists, in view of this I have recommended we continue the maintenance treatment every 2 weeks and re-scan in 3 months. She informed me after her last treatment she ran a fever around 99.7 and this was resolved with Tylenol. Her daughter has noticed that she has more longer lasting recurrent fevers now and does not seem to bounce back from her treatments as well now. Her day 1 cycle 26 of FOLFOX and Nivolumab (without Oxaliplatin) is scheduled on 12/13/2022. Her daughter asked if she can delay the week of Thanksgiving. We will postpone her cycle 28th from November 25 to December 2nd.  Matelyn informed me about the numbness/pain of her feet. I informed her about Gabapentin and its benefits and risks. I will start her on Gabapentin 100mg  to take at bedtime and then increase the dose after 2 weeks to 200mg  at bedtime. Her WBC is 5.2, hemoglobin is 12.9, and platelet count in 165,000 as of 12/06/2022. Her free T4 is normal at 1.81 and her CMP is normal other than a elevated creatinine of 1.30 with a BUN of 21. I will send copies of reports to Dr. Sudie Bailey. [name] back in 2 weeks with CBC and CMP.   She denies signs of infection such as sore throat,  sinus drainage, cough, or urinary symptoms.  She denies recurrent chills. She denies pain. She denies nausea, vomiting, chest pain, dyspnea or cough. Her appetite is pretty good and her weight has increased 1 pounds over last week . She is accompanied by her daughter at today's appointment.   REVIEW OF SYSTEMS:  Review of Systems  Constitutional:  Positive for fatigue. Negative for appetite change, chills, diaphoresis, fever and unexpected weight change.  HENT:  Negative.  Negative for hearing  loss, lump/mass, mouth sores, nosebleeds, sore throat, tinnitus, trouble swallowing and voice change.   Eyes: Negative.  Negative for eye problems and icterus.  Respiratory: Negative.  Negative for chest tightness, cough, hemoptysis, shortness of breath and wheezing.   Cardiovascular: Negative.  Negative for chest pain, leg swelling and palpitations.  Gastrointestinal:  Positive for constipation. Negative for abdominal distention, abdominal pain, blood in stool, diarrhea, nausea, rectal pain and vomiting.  Endocrine: Negative.  Negative for hot flashes.  Genitourinary: Negative.  Negative for bladder incontinence, difficulty urinating, dyspareunia, dysuria, frequency, hematuria, menstrual problem, nocturia, pelvic pain, vaginal bleeding and vaginal discharge.   Musculoskeletal:  Positive for gait problem (Walks with cane). Negative for arthralgias, back pain, flank pain, myalgias, neck pain and neck stiffness.  Skin: Negative.  Negative for itching, rash and wound.  Neurological:  Positive for dizziness and gait problem (Walks with cane). Negative for extremity weakness, headaches, light-headedness, numbness, seizures and speech difficulty.  Hematological: Negative.  Negative for adenopathy. Does not bruise/bleed easily.  Psychiatric/Behavioral: Negative.  Negative for confusion, decreased concentration, depression, sleep disturbance and suicidal ideas. The patient is not nervous/anxious.      VITALS:  Blood pressure (!) 147/70, pulse 91, temperature 97.9 F (36.6 C), temperature source Oral, resp. rate 16, height 5' (1.524 m), weight 203 lb (92.1 kg), SpO2 97%.  Wt Readings from Last 3 Encounters:  12/27/22 201 lb 1.6 oz (91.2 kg)  12/15/22 204 lb 6 oz (92.7 kg)  12/13/22 204 lb 4 oz (92.6 kg)    Body mass index is 39.65 kg/m.  Performance status (ECOG): 1 - Symptomatic but completely ambulatory  PHYSICAL EXAM:  Physical Exam Vitals and nursing note reviewed. Exam conducted with a  chaperone present.  Constitutional:      General: She is not in acute distress.    Appearance: Normal appearance. She is normal weight. She is not ill-appearing, toxic-appearing or diaphoretic.  HENT:     Head: Normocephalic and atraumatic.     Right Ear: Tympanic membrane, ear canal and external ear normal. There is no impacted cerumen.     Left Ear: Tympanic membrane, ear canal and external ear normal. There is no impacted cerumen.     Nose: Nose normal. No congestion or rhinorrhea.     Mouth/Throat:     Mouth: Mucous membranes are moist.     Pharynx: Oropharynx is clear. No oropharyngeal exudate or posterior oropharyngeal erythema.  Eyes:     General: No scleral icterus.       Right eye: No discharge.        Left eye: No discharge.     Extraocular Movements: Extraocular movements intact.     Conjunctiva/sclera: Conjunctivae normal.     Pupils: Pupils are equal, round, and reactive to light.  Neck:     Vascular: No carotid bruit.  Cardiovascular:     Rate and Rhythm: Normal rate and regular rhythm.     Pulses: Normal pulses.     Heart sounds: Normal heart sounds. No murmur  heard.    No friction rub. No gallop.  Pulmonary:     Effort: Pulmonary effort is normal. No respiratory distress.     Breath sounds: Normal breath sounds. No stridor. No wheezing, rhonchi or rales.  Chest:     Chest wall: No tenderness.  Abdominal:     General: Bowel sounds are normal. There is no distension.     Palpations: Abdomen is soft. There is no hepatomegaly, splenomegaly or mass.     Tenderness: There is no abdominal tenderness. There is no right CVA tenderness, left CVA tenderness, guarding or rebound.     Hernia: No hernia is present.  Musculoskeletal:        General: No swelling, tenderness, deformity or signs of injury. Normal range of motion.     Cervical back: Normal range of motion and neck supple. No rigidity or tenderness.     Right lower leg: No edema.     Left lower leg: No edema.   Lymphadenopathy:     Cervical: No cervical adenopathy.     Upper Body:     Right upper body: No supraclavicular or axillary adenopathy.     Left upper body: No supraclavicular or axillary adenopathy.     Lower Body: No right inguinal adenopathy. No left inguinal adenopathy.  Skin:    General: Skin is warm.     Coloration: Skin is not jaundiced or pale.     Findings: No bruising, erythema, lesion or rash.  Neurological:     General: No focal deficit present.     Mental Status: She is alert and oriented to person, place, and time. Mental status is at baseline.     Cranial Nerves: No cranial nerve deficit.     Sensory: No sensory deficit.     Motor: No weakness.     Coordination: Coordination normal.     Gait: Gait normal.     Deep Tendon Reflexes: Reflexes normal.  Psychiatric:        Mood and Affect: Mood normal.        Behavior: Behavior normal.        Thought Content: Thought content normal.        Judgment: Judgment normal.     LABS:   Component Ref Range & Units 3 d ago (12/06/22) 2 wk ago (11/24/22) 4 wk ago (11/10/22) 1 mo ago (10/28/22) 1 mo ago (10/14/22) 2 mo ago (09/29/22) 2 mo ago (09/16/22)  Hemoglobin 12.0 - 16.0 12.9 12.7 12.6 R 12.6 12.3 12.5 12.4  HCT 36 - 46 37 36 37.5 R 37 35 Abnormal  36 35 Abnormal   Neutrophils Absolute 2.44 3.13 3.0 R 5.37 4.24 4.42 3.92  Platelets 150 - 400 K/uL 162 195 185 211 216 207 223  WBC 5.2 6.8 6.5 R 9.1 8.0 8.5 7.4            Component Ref Range & Units 3 d ago (12/06/22) 2 wk ago (11/24/22) 4 wk ago (11/10/22) 1 mo ago (10/28/22) 1 mo ago (10/14/22) 2 mo ago (09/29/22) 2 mo ago (09/16/22)  Glucose 129 134 138 High  R, CM 122 120 146 161  BUN 4 - 21 21 22  Abnormal  20 R 20 19 23  Abnormal  20  CO2 13 - 22 28 Abnormal  24 Abnormal  23 R 24 Abnormal  22 23 Abnormal  19  Creatinine 0.5 - 1.1 1.3 Abnormal  1.3 Abnormal  1.43 High  R 1.2 Abnormal  1.3 Abnormal  1.3  Abnormal  1.2 Abnormal   Potassium 3.5 - 5.1 mEq/L 3.8 3.7  3.6 R 4.2 4.1 4.1 3.4 Abnormal   Sodium 137 - 147 137 138 137 R 135 Abnormal  138 138 138  Chloride 99 - 108 103 109 Abnormal  103 R 105 103 104 105            Component Ref Range & Units 3 d ago (12/06/22) 2 wk ago (11/24/22) 4 wk ago (11/10/22) 1 mo ago (10/28/22) 1 mo ago (10/14/22) 2 mo ago (09/29/22) 2 mo ago (09/16/22)  Calcium 8.7 - 10.7 9.4 9.9 9.4 R 9.6 9.9 10.1 9.9  Albumin 3.5 - 5.0 4.2 4.3 4.2 R 4.3 4.2 4.5 4.4              Component Ref Range & Units 3 d ago (12/06/22) 2 wk ago (11/24/22) 4 wk ago (11/10/22) 1 mo ago (10/28/22) 1 mo ago (10/14/22) 2 mo ago (09/29/22) 2 mo ago (09/16/22)  Alkaline Phosphatase 25 - 125 72 70 67 R 69 69 79 83  ALT 7 - 35 U/L 20 20 17  R 19 19 22 15   AST 13 - 35 24 25 21  R 26 25 37 Abnormal  26  Bilirubin, Total 0.8 0.7  0.7 0.5 0.5 1.0     Component Ref Range & Units 3 d ago (12/06/22) 2 wk ago (11/24/22) 4 wk ago (11/10/22) 1 mo ago (10/28/22) 1 mo ago (10/14/22) 2 mo ago (09/29/22) 2 mo ago (09/16/22)  TSH 0.41 - 5.90 1.97 3.689 R, CM 2.781 R, CM 1.563 R, CM 1.442 R, CM 1.113 R, CM 1.869 R, CM     Component Ref Range & Units 13 d ago (11/24/22) 3 wk ago (11/10/22) 1 mo ago (10/28/22) 1 mo ago (10/14/22) 2 mo ago (09/29/22) 2 mo ago (09/16/22) 3 mo ago (08/23/22)  T4, Total 4.5 - 12.0 ug/dL 13.0 9.9 CM 9.8 CM 86.5 CM 11.4 CM 12.1 High  CM 10.7 CM        Latest Ref Rng & Units 12/27/2022    9:05 AM 12/06/2022   12:00 AM 11/24/2022   12:00 AM  CBC  WBC 4.0 - 10.5 K/uL 7.7  5.2     6.8      Hemoglobin 12.0 - 15.0 g/dL 78.4  69.6     29.5      Hematocrit 36.0 - 46.0 % 35.5  37     36      Platelets 150 - 400 K/uL 156  165  C    195        C Corrected result   This result is from an external source.      Latest Ref Rng & Units 12/27/2022    9:05 AM 12/06/2022   12:00 AM 11/24/2022   12:00 AM  CMP  Glucose 70 - 99 mg/dL 284     BUN 8 - 23 mg/dL 23  21     22       Creatinine 0.44 - 1.00 mg/dL 1.32  1.3     1.3      Sodium  135 - 145 mmol/L 140  137     138      Potassium 3.5 - 5.1 mmol/L 4.0  3.8     3.7      Chloride 98 - 111 mmol/L 107  103     109      CO2 22 - 32 mmol/L 20  28  24      Calcium 8.9 - 10.3 mg/dL 9.5  9.4     9.9      Total Protein 6.5 - 8.1 g/dL 6.5     Total Bilirubin <1.2 mg/dL 0.5     Alkaline Phos 38 - 126 U/L 83  72     70      AST 15 - 41 U/L 22  24     25       ALT 0 - 44 U/L 19  20     20          This result is from an external source.   Lab Results  Component Value Date   CEA1 2.9 09/10/2021   /  CEA  Date Value Ref Range Status  09/10/2021 2.9 0.0 - 4.7 ng/mL Final    Comment:    (NOTE)                             Nonsmokers          <3.9                             Smokers             <5.6 [employer] Electrochemiluminescence Immunoassay (ECLIA) Values obtained with different assay methods or kits cannot be used interchangeably.  Results cannot be interpreted as absolute evidence of the presence or absence of malignant disease. Performed At: [facility] [address] [place], [place] [id number] Jolene Schimke MD WU:[id number]    No results found for: "PSA1" No results found for: "[id number]" No results found for: "CAN125"  No results found for: "TOTALPROTELP", "ALBUMINELP", "A1GS", "A2GS", "BETS", "BETA2SER", "GAMS", "MSPIKE", "SPEI" No results found for: "TIBC", "FERRITIN", "IRONPCTSAT" No results found for: "LDH"  STUDIES:  Exam: 12/06/2022 CT Chest, Abdomen, and Pelvis Impression:  Persistent wall thickening along the distal stomach with the adjacent stranding and soft tissue thickening. Recommend continued short-term surveillance.  No additional areas of peritoneal nodularity or developing lymph node enlargement. No liver lesion.  Fatty liver infiltration.  Once again changes interstitial lung disease with fibrosis, bronchiectasis. Recommend continued follow-up in correlation with the known history.  Stable slight asymmetric thickening  along the gallbladder but the gallbladder is under-distended. Attention on follow-up.  Exam: 08/30/2022 CT Chest, Abdomen, and Pelvis Impression: Mild, persistent gastric wall thickening with subtle adjacent omental nodularity, consistent with treated gastric malignancy. No evidence of lymphadenopathy or distant metastatic disease in the chest, abdomen, or pelvis.  Unchanged, asymmetric fibrosis and bronchiectasis of the right lung base, most likely related to infection or aspiration given asymmetric distribution. Attention on follow-up. Aortic Atherosclerosis (ICD10-170.0)   Exam: 08/23/2022 Left Shoulder- 2+ View Impression Degenerative changes of the left shoulder. No acute or destructive bony abnormality.   Echocardiogram: 07/02/2022 IMPRESSIONS:  1. Left ventricular ejection fraction, by estimation, is 60 to 65%. The  left ventricle has normal function. The left ventricle has no regional  wall motion abnormalities. Left ventricular diastolic parameters are  consistent with Grade I diastolic  dysfunction (impaired relaxation).GLS-18.3%   2. Right ventricular systolic function is normal. The right ventricular  size is normal. There is normal pulmonary artery systolic pressure.   3. Left atrial size was mildly dilated.   4. The mitral valve is normal in structure. Moderate mitral valve  regurgitation. No evidence of mitral stenosis.   5. The aortic  valve is normal in structure. Aortic valve regurgitation is  not visualized. No aortic stenosis is present.   6. The inferior vena cava is normal in size with greater than 50%  respiratory variability, suggesting right atrial pressure of 3 mmHg.      EXAM: 03/22/2022 NUCLEAR MEDICINE PET SKULL BASE TO THIGH IMPRESSION: 1. Resolution of metabolic activity associated with the stomach. 2. Decrease in size of omental nodularity. No associated metabolic activity. 3. No evidence of new or progressive gastric carcinoma. 4. New  peripheral consolidation in the RIGHT lower lobe with moderate metabolic activity. Favor focus of atelectasis versus less likely pneumonia. 5.  Aortic Atherosclerosis (ICD10-I70.0).     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  If that happens, she is to call us so that we can schedule her for an ultrasound to look for synovitis.     Essential hypertension 09/09/2016   Gastric cancer (HCC) 09/10/2021   Hyperlipidemia 09/01/2016   Hypertension    Hypothyroidism 09/01/2016   Osteoarthritis of both feet 01/20/2016   Osteoarthritis, hand 01/20/2016   Thyroid disease     Past Surgical History:  Procedure Laterality Date   APPENDECTOMY     LAPAROSCOPIC APPENDECTOMY N/A 04/10/2016   Procedure: APPENDECTOMY LAPAROSCOPIC;  Surgeon: Abigail Miyamoto, MD;  Location: [facility] OR;  Service: General;  Laterality: N/A;    Family History  Problem Relation Age of Onset   Hypertension Mother    Prostate cancer Father        metastatic; d. 60   Brain cancer Sister 26   Breast cancer Sister 55   AAA (abdominal aortic aneurysm) Brother    Leukemia Cousin        x2 maternal female cousins; d. before 23   Breast cancer Daughter 39       DCIS    Social History:  reports that she has never smoked. She has never used smokeless tobacco. She reports that she does not currently use alcohol. She reports that she does not use drugs.The patient is accompanied by her boyfriend and  her daughter today.  Allergies:  Allergies  Allergen Reactions   Doxycycline Rash   Sulfa Antibiotics Rash and Other (See Comments)    Other reaction(s): Other (See Comments)  "Made me feel weird"    Current Medications: Current Outpatient Medications  Medication Sig Dispense Refill   aspirin 81 MG EC tablet Take 81 mg by mouth daily. Swallow whole.     b complex vitamins capsule Take 1 capsule by mouth daily.     Calcium Carbonate (CALCIUM 600 PO) Take 1 tablet by mouth daily.     Cholecalciferol (VITAMIN D3) 5000 units CAPS Take 1 capsule by mouth daily.     EXFORGE HCT 5-160-12.5 MG TABS Take 1 tablet by mouth daily.     famotidine (PEPCID) 40 MG tablet Take 40 mg by mouth at bedtime.     gabapentin (NEURONTIN) 100 MG capsule Take 1 capsule (100 mg total) by  mouth at bedtime. For 2 weeks, then 2 capsules by mouth at bedtime 50 capsule 5   KRILL OIL PO Take 1 capsule by mouth daily. Unknown strenght     Lactobacillus TABS Take 1 tablet by mouth 2 (two) times daily.     levothyroxine (SYNTHROID, LEVOTHROID) 75 MCG tablet Take 75 mcg by mouth daily before breakfast.      NON FORMULARY MMW: 3 parts Maalox 2 parts Benadryl 1 part viscious lidicaine  Disp. 6oz  Instructions: 5ml swish and swallow every 3-4 hours     omeprazole (PRILOSEC) 40 MG capsule Take 1 capsule (40 mg total) by mouth 2 (two) times daily. 60 capsule 5   ondansetron (ZOFRAN-ODT) 4 MG disintegrating tablet Take 1 tablet (4 mg total) by mouth every 8 (eight) hours as needed for nausea or vomiting. 90 tablet 0   polyethylene glycol powder (GLYCOLAX/MIRALAX) 17 GM/SCOOP powder SMARTSIG:1 scoopful By Mouth Daily     potassium chloride SA (KLOR-CON M) 20 MEQ tablet Take 1 tablet (20 mEq total) by mouth 2 (two) times daily. 180 tablet 1   pravastatin (PRAVACHOL) 20 MG tablet Take 1 tablet (20 mg total) by mouth every evening. 90 tablet 3   Probiotic Product (PROBIOTIC DAILY PO) Take 1 tablet by mouth daily.      prochlorperazine (COMPAZINE) 10 MG tablet Take 1 tablet (10 mg total) by mouth every 6 (six) hours as needed for nausea or vomiting. 90 tablet 3   triamcinolone 0.1% oint-Eucerin equivalent cream 1:1 mixture Apply topically 2 (two) times daily. 160 g 0   No current facility-administered medications for this visit.    I,Jasmine M Lassiter,acting as a scribe for Dellia Beckwith, MD.,have documented all relevant documentation on the behalf of Dellia Beckwith, MD,as directed by  Dellia Beckwith, MD while in the presence of Dellia Beckwith, MD.

## 2022-12-08 ENCOUNTER — Ambulatory Visit (INDEPENDENT_AMBULATORY_CARE_PROVIDER_SITE_OTHER): Payer: Medicare Other | Admitting: Podiatry

## 2022-12-08 ENCOUNTER — Encounter: Payer: Self-pay | Admitting: Podiatry

## 2022-12-08 DIAGNOSIS — B351 Tinea unguium: Secondary | ICD-10-CM

## 2022-12-08 DIAGNOSIS — M79675 Pain in left toe(s): Secondary | ICD-10-CM

## 2022-12-08 DIAGNOSIS — M79674 Pain in right toe(s): Secondary | ICD-10-CM

## 2022-12-08 NOTE — Progress Notes (Signed)
Subjective:  Patient ID: Mackenzie Lewis, female    DOB: 05-02-1935,  MRN: [id number]  Mackenzie Lewis presents to clinic today for:  Chief Complaint  Patient presents with   RFC    Nail trim   Patient notes nails are thick, discolored, elongated and painful in shoegear when trying to ambulate.    PCP is Philemon Kingdom, MD.  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  If that happens, she is to call us so that we can schedule her for an ultrasound to look for synovitis.     Essential hypertension 09/09/2016   Gastric cancer (HCC) 09/10/2021   Hyperlipidemia 09/01/2016   Hypertension    Hypothyroidism 09/01/2016   Osteoarthritis of both feet 01/20/2016   Osteoarthritis, hand 01/20/2016   Thyroid disease     Past Surgical History:  Procedure Laterality Date   APPENDECTOMY     LAPAROSCOPIC APPENDECTOMY N/A 04/10/2016   Procedure: APPENDECTOMY LAPAROSCOPIC;  Surgeon: Abigail Miyamoto, MD;  Location: [facility] OR;  Service: General;  Laterality: N/A;    Allergies  Allergen Reactions   Doxycycline Rash   Sulfa Antibiotics Rash and Other (See Comments)    Other reaction(s): Other (See Comments)  "Made me feel weird"   Review of Systems: Negative except as noted in the HPI.  Objective:  Mackenzie Lewis is a pleasant 87 y.o. female in NAD. AAO x 3.  Vascular Examination: Capillary refill time is 3-5 seconds to toes bilateral. Palpable pedal pulses b/l LE.  Digital hair present b/l.  Skin temperature gradient WNL b/l. No varicosities b/l. No cyanosis noted b/l.   Dermatological Examination: Pedal skin with normal turgor, texture and tone b/l. No open wounds. No interdigital macerations b/l. Toenails x10 are 3mm thick, discolored, dystrophic with subungual debris. There is pain with compression of the nail plates.  They are elongated x10  Assessment/Plan: 1. Pain due to onychomycosis of toenails of both feet     The mycotic toenails were sharply debrided x10 with sterile nail nippers and a power debriding burr to decrease bulk/thickness and length.    Return in about 3 months (around 03/10/2023) for RFC.   Clerance Lav, DPM, FACFAS [facility]     [address] [place], [place] [address]                Office [phone]  Fax [phone]

## 2022-12-09 ENCOUNTER — Encounter: Payer: Self-pay | Admitting: Oncology

## 2022-12-09 ENCOUNTER — Other Ambulatory Visit: Payer: Self-pay | Admitting: Oncology

## 2022-12-09 ENCOUNTER — Inpatient Hospital Stay: Payer: Medicare Other | Admitting: Oncology

## 2022-12-09 ENCOUNTER — Other Ambulatory Visit: Payer: Self-pay

## 2022-12-09 VITALS — BP 147/70 | HR 91 | Temp 97.9°F | Resp 16 | Ht 60.0 in | Wt 203.0 lb

## 2022-12-09 DIAGNOSIS — C168 Malignant neoplasm of overlapping sites of stomach: Secondary | ICD-10-CM | POA: Diagnosis not present

## 2022-12-09 DIAGNOSIS — E876 Hypokalemia: Secondary | ICD-10-CM | POA: Diagnosis not present

## 2022-12-09 DIAGNOSIS — L723 Sebaceous cyst: Secondary | ICD-10-CM | POA: Diagnosis not present

## 2022-12-09 DIAGNOSIS — K219 Gastro-esophageal reflux disease without esophagitis: Secondary | ICD-10-CM | POA: Diagnosis not present

## 2022-12-09 DIAGNOSIS — E785 Hyperlipidemia, unspecified: Secondary | ICD-10-CM | POA: Diagnosis not present

## 2022-12-09 DIAGNOSIS — E063 Autoimmune thyroiditis: Secondary | ICD-10-CM | POA: Diagnosis not present

## 2022-12-09 DIAGNOSIS — G609 Hereditary and idiopathic neuropathy, unspecified: Secondary | ICD-10-CM

## 2022-12-09 DIAGNOSIS — C169 Malignant neoplasm of stomach, unspecified: Secondary | ICD-10-CM | POA: Diagnosis not present

## 2022-12-09 DIAGNOSIS — Z7982 Long term (current) use of aspirin: Secondary | ICD-10-CM | POA: Diagnosis not present

## 2022-12-09 DIAGNOSIS — Z79899 Other long term (current) drug therapy: Secondary | ICD-10-CM | POA: Diagnosis not present

## 2022-12-09 DIAGNOSIS — I10 Essential (primary) hypertension: Secondary | ICD-10-CM | POA: Diagnosis not present

## 2022-12-09 DIAGNOSIS — Z5111 Encounter for antineoplastic chemotherapy: Secondary | ICD-10-CM | POA: Diagnosis not present

## 2022-12-09 DIAGNOSIS — Z6834 Body mass index (BMI) 34.0-34.9, adult: Secondary | ICD-10-CM | POA: Diagnosis not present

## 2022-12-09 DIAGNOSIS — Z5112 Encounter for antineoplastic immunotherapy: Secondary | ICD-10-CM | POA: Diagnosis not present

## 2022-12-09 LAB — T4, FREE: Free T4: 1.81

## 2022-12-09 MED ORDER — GABAPENTIN 100 MG PO CAPS
100.0000 mg | ORAL_CAPSULE | Freq: Every day | ORAL | 5 refills | Status: DC
Start: 2022-12-09 — End: 2023-05-05

## 2022-12-10 ENCOUNTER — Encounter: Payer: Self-pay | Admitting: Oncology

## 2022-12-10 MED FILL — Leucovorin Calcium For Inj 350 MG: INTRAMUSCULAR | Qty: 31.5 | Status: AC

## 2022-12-10 MED FILL — Nivolumab IV Soln 240 MG/24ML: INTRAVENOUS | Qty: 24 | Status: AC

## 2022-12-10 MED FILL — Fluorouracil IV Soln 5 GM/100ML (50 MG/ML): INTRAVENOUS | Qty: 77 | Status: AC

## 2022-12-10 MED FILL — Fluorouracil IV Soln 2.5 GM/50ML (50 MG/ML): INTRAVENOUS | Qty: 13 | Status: AC

## 2022-12-13 ENCOUNTER — Inpatient Hospital Stay: Payer: Medicare Other

## 2022-12-13 VITALS — BP 161/71 | HR 91 | Temp 98.0°F | Resp 16 | Ht 60.0 in | Wt 204.2 lb

## 2022-12-13 DIAGNOSIS — C169 Malignant neoplasm of stomach, unspecified: Secondary | ICD-10-CM | POA: Diagnosis not present

## 2022-12-13 DIAGNOSIS — Z5112 Encounter for antineoplastic immunotherapy: Secondary | ICD-10-CM | POA: Diagnosis not present

## 2022-12-13 DIAGNOSIS — Z79899 Other long term (current) drug therapy: Secondary | ICD-10-CM | POA: Diagnosis not present

## 2022-12-13 DIAGNOSIS — C168 Malignant neoplasm of overlapping sites of stomach: Secondary | ICD-10-CM

## 2022-12-13 DIAGNOSIS — E876 Hypokalemia: Secondary | ICD-10-CM | POA: Diagnosis not present

## 2022-12-13 DIAGNOSIS — Z5111 Encounter for antineoplastic chemotherapy: Secondary | ICD-10-CM | POA: Diagnosis not present

## 2022-12-13 DIAGNOSIS — Z7982 Long term (current) use of aspirin: Secondary | ICD-10-CM | POA: Diagnosis not present

## 2022-12-13 MED ORDER — FLUOROURACIL CHEMO INJECTION 2.5 GM/50ML
320.0000 mg/m2 | Freq: Once | INTRAVENOUS | Status: AC
Start: 2022-12-13 — End: 2022-12-13
  Administered 2022-12-13: 650 mg via INTRAVENOUS
  Filled 2022-12-13: qty 13

## 2022-12-13 MED ORDER — SODIUM CHLORIDE 0.9 % IV SOLN
1960.0000 mg/m2 | INTRAVENOUS | Status: DC
  Administered 2022-12-13: 3850 mg via INTRAVENOUS
  Filled 2022-12-13: qty 77

## 2022-12-13 MED ORDER — LEUCOVORIN CALCIUM INJECTION 350 MG
320.0000 mg/m2 | Freq: Once | INTRAVENOUS | Status: AC
  Administered 2022-12-13: 630 mg via INTRAVENOUS
  Filled 2022-12-13: qty 31.5

## 2022-12-13 MED ORDER — PALONOSETRON HCL INJECTION 0.25 MG/5ML
0.2500 mg | Freq: Once | INTRAVENOUS | Status: AC
Start: 2022-12-13 — End: 2022-12-13
  Administered 2022-12-13: 0.25 mg via INTRAVENOUS
  Filled 2022-12-13: qty 5

## 2022-12-13 MED ORDER — DEXTROSE 5 % IV SOLN: Freq: Once | INTRAVENOUS | Status: AC

## 2022-12-13 MED ORDER — SODIUM CHLORIDE 0.9 % IV SOLN: Freq: Once | INTRAVENOUS | Status: AC

## 2022-12-13 MED ORDER — SODIUM CHLORIDE 0.9 % IV SOLN
240.0000 mg | Freq: Once | INTRAVENOUS | Status: AC
  Administered 2022-12-13: 240 mg via INTRAVENOUS
  Filled 2022-12-13: qty 24

## 2022-12-13 MED ORDER — DEXAMETHASONE SODIUM PHOSPHATE 10 MG/ML IJ SOLN
10.0000 mg | Freq: Once | INTRAMUSCULAR | Status: AC
  Administered 2022-12-13: 10 mg via INTRAVENOUS
  Filled 2022-12-13: qty 1

## 2022-12-13 NOTE — Patient Instructions (Signed)
[facility]  Discharge Instructions: Thank you for choosing [facility] to provide your oncology and hematology care.  If you have a lab appointment with the Cancer Center, please go directly to the Cancer Center and check in at the registration area.   Wear comfortable clothing and clothing appropriate for easy access to any Portacath or PICC line.   We strive to give you quality time with your provider. You may need to reschedule your appointment if you arrive late (15 or more minutes).  Arriving late affects you and other patients whose appointments are after yours.  Also, if you miss three or more appointments without notifying the office, you may be dismissed from the clinic at the provider's discretion.      For prescription refill requests, have your pharmacy contact our office and allow 72 hours for refills to be completed.    Today you received the following chemotherapy and/or immunotherapy agents Opdivo, leucovorin, flurouracil       To help prevent nausea and vomiting after your treatment, we encourage you to take your nausea medication as directed.  BELOW ARE SYMPTOMS THAT SHOULD BE REPORTED IMMEDIATELY: *FEVER GREATER THAN 100.4 F (38 C) OR HIGHER *CHILLS OR SWEATING *NAUSEA AND VOMITING THAT IS NOT CONTROLLED WITH YOUR NAUSEA MEDICATION *UNUSUAL SHORTNESS OF BREATH *UNUSUAL BRUISING OR BLEEDING *URINARY PROBLEMS (pain or burning when urinating, or frequent urination) *BOWEL PROBLEMS (unusual diarrhea, constipation, pain near the anus) TENDERNESS IN MOUTH AND THROAT WITH OR WITHOUT PRESENCE OF ULCERS (sore throat, sores in mouth, or a toothache) UNUSUAL RASH, SWELLING OR PAIN  UNUSUAL VAGINAL DISCHARGE OR ITCHING   Items with * indicate a potential emergency and should be followed up as soon as possible or go to the Emergency Department if any problems should occur.  Please show the CHEMOTHERAPY ALERT CARD or IMMUNOTHERAPY ALERT CARD  at check-in to the Emergency Department and triage nurse.  Should you have questions after your visit or need to cancel or reschedule your appointment, please contact [facility]  Dept: [phone]  and follow the prompts.  Office hours are 8:00 a.m. to 4:30 p.m. Monday - Friday. Please note that voicemails left after 4:00 p.m. may not be returned until the following business day.  We are closed weekends and major holidays. You have access to a nurse at all times for urgent questions. Please call the main number to the clinic Dept: [phone] and follow the prompts.  For any non-urgent questions, you may also contact your provider using MyChart. We now offer e-Visits for anyone 6 and older to request care online for non-urgent symptoms. For details visit [email or web address].   Also download the MyChart app! Go to the app store, search "MyChart", open the app, select [facility] [place], and log in with your MyChart username and password.

## 2022-12-15 ENCOUNTER — Inpatient Hospital Stay: Payer: Medicare Other

## 2022-12-15 VITALS — BP 131/55 | HR 88 | Temp 99.6°F | Resp 18 | Ht 60.0 in | Wt 204.4 lb

## 2022-12-15 DIAGNOSIS — Z5112 Encounter for antineoplastic immunotherapy: Secondary | ICD-10-CM | POA: Diagnosis not present

## 2022-12-15 DIAGNOSIS — Z5111 Encounter for antineoplastic chemotherapy: Secondary | ICD-10-CM | POA: Diagnosis not present

## 2022-12-15 DIAGNOSIS — C169 Malignant neoplasm of stomach, unspecified: Secondary | ICD-10-CM | POA: Diagnosis not present

## 2022-12-15 DIAGNOSIS — Z7982 Long term (current) use of aspirin: Secondary | ICD-10-CM | POA: Diagnosis not present

## 2022-12-15 DIAGNOSIS — E876 Hypokalemia: Secondary | ICD-10-CM | POA: Diagnosis not present

## 2022-12-15 DIAGNOSIS — C168 Malignant neoplasm of overlapping sites of stomach: Secondary | ICD-10-CM

## 2022-12-15 DIAGNOSIS — Z79899 Other long term (current) drug therapy: Secondary | ICD-10-CM | POA: Diagnosis not present

## 2022-12-15 MED ORDER — SODIUM CHLORIDE 0.9 % IV SOLN: Freq: Once | INTRAVENOUS | Status: AC

## 2022-12-15 MED ORDER — SODIUM CHLORIDE 0.9% FLUSH
10.0000 mL | INTRAVENOUS | Status: DC | PRN
  Administered 2022-12-15: 10 mL

## 2022-12-15 MED ORDER — HEPARIN SOD (PORK) LOCK FLUSH 100 UNIT/ML IV SOLN
500.0000 [IU] | Freq: Once | INTRAVENOUS | Status: AC | PRN
  Administered 2022-12-15: 500 [IU]

## 2022-12-15 NOTE — Patient Instructions (Signed)
Managing Chemotherapy Side Effects, Adult Chemotherapy is a treatment that uses medicine to kill cancer cells. However, in addition to killing cancer cells, the medicines can also damage healthy cells. The damage to healthy cells can lead to side effects. The exact side effects depend on the specific medicines used. Most of the side effects of chemotherapy go away once treatment is finished. Until then, work closely with your health care providers and take an active role in managing your side effects. What are common side effects of chemotherapy? Increased risk of infection, bruising, or bleeding. Nausea and vomiting. Constipation or diarrhea. Loss of appetite. Hair loss. Mouth or throat sores. Tiredness (fatigue). Tingling, pain, or numbness in the hands and feet. Dry, sensitive, itchy, or sore skin. Sleep disturbances, such as excessive sleepiness. Confusion, anxiety, or mood swings. Memory changes. How to manage the side effects of chemotherapy Medicines Take over-the-counter and prescription medicines only as told by your health care provider. Talk with your health care provider before taking vitamins, herbs, supplements, or over-the-counter medicines. Some of these can interfere with chemotherapy. Activity Get plenty of rest. Get regular exercise by doing activities such as walking, gentle yoga, or tai chi. Return to your normal activities as told by your health care provider. Ask your health care provider what activities are safe for you. Eating and drinking  Talk to a dietitian about what you should eat and drink during cancer treatment. Drink enough fluid to keep your urine pale yellow. If you have side effects that affect eating, these tips may help: Eat smaller meals and snacks often. Drink high-nutrition and high-calorie shakes or supplements. Choose bland and soft foods that are easy to eat. Do not eat foods that are hot, spicy, or hard to swallow. Do not eat raw or  undercooked meat, eggs, or seafood. Always wash fresh fruits and vegetables well before eating them. Skin care If you have sore or itchy skin: Wear soft, comfortable clothing. Apply creams and ointments to your skin as told by your health care provider. If you lose your hair, consider wearing a wig, hat, or scarf to cover your head. You may want to have someone shave your head as you start to lose hair. During outdoor activities, protect your head and skin from the sun by using sunscreen with an SPF of 30 or higher or by wearing protective clothing and a hat. Meet with a hair and skin care specialist for makeup and skin care tips. Apply sunscreen to your scalp as told by your health care provider. General tips Learn as much as you can about your condition. If you are struggling emotionally, talk with a mental health care provider or join a support group. Keep all follow-up visits. This is important. How to prevent infection and bleeding Chemotherapy may lower your blood counts and put you at risk for infection and bleeding. Here are some ways to help prevent problems. Vaccines Talk to your health care provider about vaccines. You should not get any live vaccines, such as the polio, MMR, chickenpox, and shingles vaccines until your health care provider says that it is safe to do so. Do not be around people who have had live vaccines for as long as your health care provider recommends. Make sure you get a yearly flu shot. People who will be near you should also get a yearly flu shot. Social activity Stay away from crowded places where you could be exposed to germs. Do not be around people who may be sick or  people who have symptoms of a fever until they have been fever-free for at least 24 hours. Do not share food, cups, straws, or utensils with other people. Wear a mask when outside the home if your blood counts are low. Cleanliness  Wash your hands often for at least 20 seconds. Also make  sure that other members of your household wash their hands often. Brush your teeth twice daily using a soft toothbrush. Use mouth rinse only as told by your health care provider. Take a bath or shower daily unless your health care provider gives different instructions. General tips Take your temperature regularly, especially if you have chills or feel warm. Check with your health care provider: Before you travel. Before you have a dental procedure. Before you use a swimming pool, hot tub, or swim in a lake or ocean. If you get chemotherapy through an IV or port, check the site every day for signs of infection. Check for redness, swelling, pain, fluid, and warmth. Avoid activities that put you at risk for injury or bleeding. Use an electric razor to shave instead of a blade. Questions to ask your health care provider What are the most common side effects of my treatment? How will they affect my daily life? What can I do to manage them? What are some possible long-term side effects? What are possible complications? What support services are available? What number can I call with questions or concerns? Where to find support Cancer affects the entire family. Find out what family support resources are available from your cancer treatment center. For more support, turn to: Your cancer care team. Friends and family. Your religious community. Other people with cancer. Community-based or online support groups. Where to find more information National Cancer Institute: www.cancer.gov American Cancer Society: www.cancer.org Contact a health care provider if: You bleed or bruise more often. You notice blood in your urine or stool. You have any of these symptoms: A skin rash, or dry or itchy skin. A headache or stiff neck. Cold or flu symptoms. A cough. Persistent nausea or vomiting. Persistent diarrhea. Frequent urination, burning when passing urine, or foul-smelling urine. You cannot eat  because of mouth or throat pain. You are sad, confused, anxious, or depressed. Get help right away if: You have any of these symptoms: A fever or chills. Your health care provider should know about this right away. Redness, swelling, pain, fluid, or warmth near an IV site. Bleeding that you cannot stop. A seizure. You cannot swallow. You have chest pain. You have trouble breathing. A family member or caregiver should get help right away if you have a sudden or unusual change in behavior. These symptoms may be an emergency. Get help right away. Call 911. Do not wait to see if the symptoms will go away. Do not drive yourself to the hospital. Summary Chemotherapy is a treatment that uses medicine to kill cancer cells and can cause side effects. The specific side effects depend on the specific medicines used. Learn as much as you can about your condition. Ask about side effects to watch for and how to treat them. Seek out support and resources from others. Find out what family support resources are available from your cancer treatment center. Let your health care provider know if you notice any new, unusual, or worsening symptoms, especially fever or chills. This information is not intended to replace advice given to you by your health care provider. Make sure you discuss any questions you have with your health  care provider. Document Revised: 01/22/2021 Document Reviewed: 01/22/2021 Elsevier Patient Education  2024 [employer].

## 2022-12-20 ENCOUNTER — Other Ambulatory Visit: Payer: Self-pay | Admitting: Oncology

## 2022-12-20 ENCOUNTER — Other Ambulatory Visit: Payer: Self-pay | Admitting: Pharmacist

## 2022-12-20 ENCOUNTER — Encounter: Payer: Self-pay | Admitting: Oncology

## 2022-12-20 DIAGNOSIS — C168 Malignant neoplasm of overlapping sites of stomach: Secondary | ICD-10-CM

## 2022-12-22 ENCOUNTER — Encounter: Payer: Self-pay | Admitting: Oncology

## 2022-12-23 ENCOUNTER — Encounter: Payer: Self-pay | Admitting: Oncology

## 2022-12-27 ENCOUNTER — Encounter: Payer: Self-pay | Admitting: Hematology and Oncology

## 2022-12-27 ENCOUNTER — Encounter: Payer: Self-pay | Admitting: Oncology

## 2022-12-27 ENCOUNTER — Inpatient Hospital Stay: Payer: Medicare Other | Attending: Hematology and Oncology | Admitting: Hematology and Oncology

## 2022-12-27 ENCOUNTER — Inpatient Hospital Stay: Payer: Medicare Other

## 2022-12-27 VITALS — BP 146/67 | HR 92 | Temp 98.4°F | Resp 20 | Ht 60.0 in | Wt 201.1 lb

## 2022-12-27 DIAGNOSIS — E785 Hyperlipidemia, unspecified: Secondary | ICD-10-CM | POA: Diagnosis not present

## 2022-12-27 DIAGNOSIS — C168 Malignant neoplasm of overlapping sites of stomach: Secondary | ICD-10-CM

## 2022-12-27 DIAGNOSIS — E063 Autoimmune thyroiditis: Secondary | ICD-10-CM | POA: Diagnosis not present

## 2022-12-27 DIAGNOSIS — N142 Nephropathy induced by unspecified drug, medicament or biological substance: Secondary | ICD-10-CM

## 2022-12-27 DIAGNOSIS — Z79899 Other long term (current) drug therapy: Secondary | ICD-10-CM | POA: Diagnosis not present

## 2022-12-27 DIAGNOSIS — Z5111 Encounter for antineoplastic chemotherapy: Secondary | ICD-10-CM | POA: Insufficient documentation

## 2022-12-27 DIAGNOSIS — C169 Malignant neoplasm of stomach, unspecified: Secondary | ICD-10-CM | POA: Insufficient documentation

## 2022-12-27 DIAGNOSIS — E876 Hypokalemia: Secondary | ICD-10-CM

## 2022-12-27 LAB — CBC WITH DIFFERENTIAL (CANCER CENTER ONLY)
Abs Immature Granulocytes: 0.02 10*3/uL (ref 0.00–0.07)
Basophils Absolute: 0.1 10*3/uL (ref 0.0–0.1)
Basophils Relative: 1 %
Eosinophils Absolute: 0.4 10*3/uL (ref 0.0–0.5)
Eosinophils Relative: 5 %
HCT: 35.5 % — ABNORMAL LOW (ref 36.0–46.0)
Hemoglobin: 12.7 g/dL (ref 12.0–15.0)
Immature Granulocytes: 0 %
Lymphocytes Relative: 28 %
Lymphs Abs: 2.2 10*3/uL (ref 0.7–4.0)
MCH: 31.1 pg (ref 26.0–34.0)
MCHC: 35.8 g/dL (ref 30.0–36.0)
MCV: 86.8 fL (ref 80.0–100.0)
Monocytes Absolute: 0.8 10*3/uL (ref 0.1–1.0)
Monocytes Relative: 11 %
Neutro Abs: 4.3 10*3/uL (ref 1.7–7.7)
Neutrophils Relative %: 55 %
Platelet Count: 156 10*3/uL (ref 150–400)
RBC: 4.09 MIL/uL (ref 3.87–5.11)
RDW: 15.2 % (ref 11.5–15.5)
WBC Count: 7.7 10*3/uL (ref 4.0–10.5)
nRBC: 0 % (ref 0.0–0.2)
nRBC: 0 /100{WBCs}

## 2022-12-27 LAB — CMP (CANCER CENTER ONLY)
ALT: 19 U/L (ref 0–44)
AST: 22 U/L (ref 15–41)
Albumin: 4.3 g/dL (ref 3.5–5.0)
Alkaline Phosphatase: 83 U/L (ref 38–126)
Anion gap: 13 (ref 5–15)
BUN: 23 mg/dL (ref 8–23)
CO2: 20 mmol/L — ABNORMAL LOW (ref 22–32)
Calcium: 9.5 mg/dL (ref 8.9–10.3)
Chloride: 107 mmol/L (ref 98–111)
Creatinine: 1.36 mg/dL — ABNORMAL HIGH (ref 0.44–1.00)
GFR, Estimated: 38 mL/min — ABNORMAL LOW (ref >60)
Glucose, Bld: 123 mg/dL — ABNORMAL HIGH (ref 70–99)
Potassium: 4 mmol/L (ref 3.5–5.1)
Sodium: 140 mmol/L (ref 135–145)
Total Bilirubin: 0.5 mg/dL (ref <1.2)
Total Protein: 6.5 g/dL (ref 6.5–8.1)

## 2022-12-27 LAB — TSH: TSH: 1.681 u[IU]/mL (ref 0.350–4.500)

## 2022-12-27 MED ORDER — SODIUM CHLORIDE 0.9 % IV SOLN: Freq: Once | INTRAVENOUS | Status: AC

## 2022-12-27 MED ORDER — DEXTROSE 5 % IV SOLN: Freq: Once | INTRAVENOUS | Status: AC

## 2022-12-27 MED ORDER — PALONOSETRON HCL INJECTION 0.25 MG/5ML
0.2500 mg | Freq: Once | INTRAVENOUS | Status: AC
  Administered 2022-12-27: 0.25 mg via INTRAVENOUS
  Filled 2022-12-27: qty 5

## 2022-12-27 MED ORDER — LEUCOVORIN CALCIUM INJECTION 350 MG
320.0000 mg/m2 | Freq: Once | INTRAVENOUS | Status: AC
  Administered 2022-12-27: 630 mg via INTRAVENOUS
  Filled 2022-12-27: qty 31.5

## 2022-12-27 MED ORDER — FLUOROURACIL CHEMO INJECTION 2.5 GM/50ML
320.0000 mg/m2 | Freq: Once | INTRAVENOUS | Status: AC
Start: 2022-12-27 — End: 2022-12-27
  Administered 2022-12-27: 650 mg via INTRAVENOUS
  Filled 2022-12-27: qty 13

## 2022-12-27 MED ORDER — DEXAMETHASONE SODIUM PHOSPHATE 10 MG/ML IJ SOLN
10.0000 mg | Freq: Once | INTRAMUSCULAR | Status: AC
Start: 2022-12-27 — End: 2022-12-27
  Administered 2022-12-27: 10 mg via INTRAVENOUS
  Filled 2022-12-27: qty 1

## 2022-12-27 MED ORDER — NIVOLUMAB CHEMO INJECTION 240 MG/24ML
240.0000 mg | Freq: Once | INTRAVENOUS | Status: AC
  Administered 2022-12-27: 240 mg via INTRAVENOUS
  Filled 2022-12-27: qty 24

## 2022-12-27 MED ORDER — FLUOROURACIL CHEMO INJECTION 5 GM/100ML
1960.0000 mg/m2 | INTRAVENOUS | Status: DC
  Administered 2022-12-27: 3850 mg via INTRAVENOUS
  Filled 2022-12-27: qty 77

## 2022-12-27 NOTE — Assessment & Plan Note (Signed)
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  Stain for HER2 was negative.  PET scan revealed omental involvement, but no distant metastasis was seen. She is receiving palliative chemotherapy with FOLFOX/nivolumab (5-fluorouracil/leucovorin/oxaliplatin/nivolumab), which now consists of 5-fluorouracil/leucovorin/nivolumab.  Oxaliplatin was discontinued after 11 cycles.  PET in February 2024 revealed resolution of metabolic activity associated with the stomach.  Decrease in size of omental nodularity. No associated metabolic activity.   EGD in March revealed residual disease.  CT chest, abdomen and pelvis in July revealed stable disease with persistent mild gastric wall thickening and subtle adjacent omental nodularity.  CT chest, abdomen pelvis and October revealed persistent wall thickening along the distal stomach with the adjacent stranding and soft tissue thickening, no additional areas of peritoneal nodularity or developing lymph node enlargement, no liver lesion and fatty liver infiltration. Once again changes interstitial lung disease with fibrosis, bronchiectasis were seen, as well as stable slight asymmetric thickening along the gallbladder, but the gallbladder is under-distended.  Her disease remains stable with treatment but still persists, so we recommended we continue the maintenance treatment and repeat imaging in 3 months.     She receives extra IV fluids with her infusion to prevent recurrent acute kidney injury.  Her kidney function is stable.  She will proceed with 27th of 5-fluorouracil/leucovorin/nivolumab cycle today.  We will continue with her treatments.  The patient would like to skip the week of Thanksgiving, so I will plan to see her back in 3 weeks with a CBC, comprehensive metabolic panel, and TSH prior to a 28th cycle.

## 2022-12-27 NOTE — Assessment & Plan Note (Signed)
Her potassium remains normal on potassium chloride 20 mEq twice daily, which we will continue.

## 2022-12-27 NOTE — Progress Notes (Signed)
[facility]  [address] [place],  [place]  [address] [phone]  Clinic Day:  12/27/2022  Referring physician: Philemon Kingdom, MD  ASSESSMENT & PLAN:   Assessment & Plan: Gastric cancer (HCC) Stage IVB (T4 N0 M1) poorly differentiated adenocarcinoma of the stomach with signet ring features and ulceration, metastatic to the omentum.  Stain for HER2 was negative.  PET scan revealed omental involvement, but no distant metastasis was seen. She is receiving palliative chemotherapy with FOLFOX/nivolumab (5-fluorouracil/leucovorin/oxaliplatin/nivolumab), which now consists of 5-fluorouracil/leucovorin/nivolumab.  Oxaliplatin was discontinued after 11 cycles.  PET in February 2024 revealed resolution of metabolic activity associated with the stomach.  Decrease in size of omental nodularity. No associated metabolic activity.   EGD in March revealed residual disease.  CT chest, abdomen and pelvis in July revealed stable disease with persistent mild gastric wall thickening and subtle adjacent omental nodularity.  CT chest, abdomen pelvis and October revealed persistent wall thickening along the distal stomach with the adjacent stranding and soft tissue thickening, no additional areas of peritoneal nodularity or developing lymph node enlargement, no liver lesion and fatty liver infiltration. Once again changes interstitial lung disease with fibrosis, bronchiectasis were seen, as well as stable slight asymmetric thickening along the gallbladder, but the gallbladder is under-distended.  Her disease remains stable with treatment but still persists, so we recommended we continue the maintenance treatment and repeat imaging in 3 months.     She receives extra IV fluids with her infusion to prevent recurrent acute kidney injury.  Her kidney function is stable.  She will proceed with 27th of 5-fluorouracil/leucovorin/nivolumab cycle today.  We will continue with her treatments.  The  patient would like to skip the week of Thanksgiving, so I will plan to see her back in 3 weeks with a CBC, comprehensive metabolic panel, and TSH prior to a 28th cycle.  Drug-induced interstitial nephritis History immune mediated nephritis from immunotherapy in February, which resolved with prednisone.  Her creatinine is stable.  We have continued to give her IV fluids the week of her chemotherapy.    Hypokalemia Her potassium remains normal on potassium chloride 20 mEq twice daily, which we will continue.    The patient understands the plans discussed today and is in agreement with them.  She knows to contact our office if she develops concerns prior to her next appointment.   I provided 30 minutes of face-to-face time during this encounter and > 50% was spent counseling as documented under my assessment and plan.    Mackenzie Perl, PA-C  [facility] - A DEPT OF [facility] [address] [place] [address] Dept: [phone] Dept Fax: [phone]   No orders of the defined types were placed in this encounter.     CHIEF COMPLAINT:  CC: Stage IVB gastric cancer  Current Treatment: 5-fluorouracil/leucovorin/nivolumab every 2 weeks  HISTORY OF PRESENT ILLNESS:   Oncology History  Gastric cancer (HCC)  09/10/2021 Initial Diagnosis   Gastric cancer (HCC)   09/10/2021 Cancer Staging   Staging form: Stomach, AJCC 8th Edition - Clinical stage from 09/10/2021: Stage IVB (cT4b, cN0, cM1) - Signed by Dellia Beckwith, MD on 09/10/2021 Histopathologic type: Adenocarcinoma, NOS Stage prefix: Initial diagnosis Total positive nodes: 0 Histologic grade (G): G3 Histologic grading system: 3 grade system Sites of metastasis: Peritoneal surface Diagnostic confirmation: Positive histology PLUS positive immunophenotyping and/or positive genetic studies Specimen type: Endoscopy with Biopsy Staged by: Managing  physician Carcinoembryonic antigen (CEA) (ng/mL): 2.8 Carbohydrate antigen 19-9 (CA 19-9) (U/mL): 4.9 HER2 status: Unknown Microsatellite instability (MSI): Unknown Tumor location in stomach: Other Clinical staging modalities: Biopsy, Endoscopy Stage used in treatment planning: Yes National guidelines used in treatment planning: Yes Type of national guideline used in treatment planning: NCCN   09/28/2021 - 10/14/2021 Chemotherapy   Patient is on Treatment Plan : GASTROESOPHAGEAL FOLFOX + Nivolumab q14d     09/28/2021 -  Chemotherapy   Patient is on Treatment Plan : GASTROESOPHAGEAL FOLFOX + Nivolumab q14d     12/09/2021 Genetic Testing   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  Report date is 12/09/2021.   The Multi-Cancer + RNA Panel offered by [facility] includes sequencing and/or deletion/duplication analysis of the following 84 genes:  AIP*, ALK, APC*, ATM*, AXIN2*, BAP1*, BARD1*, BLM*, BMPR1A*, BRCA1*, BRCA2*, BRIP1*, CASR, CDC73*, CDH1*, CDK4, CDKN1B*, CDKN1C*, CDKN2A, CEBPA, CHEK2*, CTNNA1*, DICER1*, DIS3L2*, EGFR, EPCAM, FH*, FLCN*, GATA2*, GPC3, GREM1, HOXB13, HRAS, KIT, MAX*, MEN1*, MET, MITF, MLH1*, MSH2*, MSH3*, MSH6*, MUTYH*, NBN*, NF1*, NF2*, NTHL1*, PALB2*, PDGFRA, PHOX2B, PMS2*, POLD1*, POLE*, POT1*, PRKAR1A*, PTCH1*, PTEN*, RAD50*, RAD51C*, RAD51D*, RB1*, RECQL4, RET, RUNX1*, SDHA*, SDHAF2*, SDHB*, SDHC*, SDHD*, SMAD4*, SMARCA4*, SMARCB1*, SMARCE1*, STK11*, SUFU*, TERC, TERT, TMEM127*, Tp53*, TSC1*, TSC2*, VHL*, WRN*, and WT1.  RNA analysis is performed for * genes.       INTERVAL HISTORY:  Mackenzie Lewis is here today for repeat clinical assessment prior to a 27th cycle of 5-fluorouracil/leucovorin/nivolumab. She states she continues to have fatigue, low grade temperature and blurry vision for a few days after treatment. She reports stable neuropathy. She reports difficulty emptying her bladder with nocturia x 1, but denies frequency,  urgency, dysuria or hematuria. She denies fevers or chills. She denies pain. Her appetite is good. Her weight has decreased 3 pounds over last 2 weeks .  REVIEW OF SYSTEMS:  Review of Systems  Constitutional:  Positive for fatigue and fever (intermittent after treatment). Negative for appetite change, chills, diaphoresis and unexpected weight change.  HENT:   Negative for lump/mass, mouth sores, nosebleeds, sore throat and trouble swallowing.   Eyes:  Positive for eye problems (intermittent blurry vision after treatment).  Respiratory:  Negative for cough, hemoptysis and shortness of breath.   Cardiovascular:  Negative for chest pain and leg swelling.  Gastrointestinal:  Negative for abdominal pain, blood in stool, constipation, diarrhea, nausea and vomiting.  Endocrine: Negative for hot flashes.  Genitourinary:  Positive for difficulty urinating (difficulty emptying) and nocturia (x1). Negative for dysuria, frequency, hematuria and vaginal bleeding.   Musculoskeletal:  Negative for arthralgias, back pain, gait problem and myalgias.  Skin:  Negative for itching and rash.  Neurological:  Positive for numbness (bilateral feet/legs). Negative for dizziness, gait problem, headaches and light-headedness.  Hematological:  Negative for adenopathy. Does not bruise/bleed easily.  Psychiatric/Behavioral:  Negative for depression and sleep disturbance. The patient is not nervous/anxious.      VITALS:  Blood pressure (!) 146/67, pulse 92, temperature 98.4 F (36.9 C), temperature source Oral, resp. rate 20, height 5' (1.524 m), weight 201 lb 1.6 oz (91.2 kg), SpO2 96%.  Wt Readings from Last 3 Encounters:  12/27/22 201 lb 1.6 oz (91.2 kg)  12/15/22 204 lb 6 oz (92.7 kg)  12/13/22 204 lb 4 oz (92.6 kg)    Body mass index is 39.27 kg/m.  Performance status (ECOG): 1 - Symptomatic but completely ambulatory  PHYSICAL EXAM:  Physical Exam Vitals and nursing note reviewed.  Constitutional:  General: She is not in acute distress.    Appearance: Normal appearance. She is not ill-appearing.  HENT:     Head: Normocephalic and atraumatic.     Mouth/Throat:     Mouth: Mucous membranes are moist.     Pharynx: Oropharynx is clear. No oropharyngeal exudate or posterior oropharyngeal erythema.  Eyes:     General: No scleral icterus.    Extraocular Movements: Extraocular movements intact.     Conjunctiva/sclera: Conjunctivae normal.     Pupils: Pupils are equal, round, and reactive to light.  Cardiovascular:     Rate and Rhythm: Normal rate and regular rhythm.     Heart sounds: Normal heart sounds. No murmur heard.    No friction rub. No gallop.  Pulmonary:     Effort: Pulmonary effort is normal.     Breath sounds: Normal breath sounds. No wheezing, rhonchi or rales.  Abdominal:     General: There is no distension.     Palpations: Abdomen is soft. There is no hepatomegaly, splenomegaly or mass.     Tenderness: There is no abdominal tenderness.  Musculoskeletal:        General: Normal range of motion.     Cervical back: Normal range of motion and neck supple. No tenderness.     Right lower leg: No edema.     Left lower leg: No edema.  Lymphadenopathy:     Cervical: No cervical adenopathy.     Upper Body:     Right upper body: No supraclavicular or axillary adenopathy.     Left upper body: No supraclavicular or axillary adenopathy.     Lower Body: No right inguinal adenopathy. No left inguinal adenopathy.  Skin:    General: Skin is warm and dry.     Coloration: Skin is not jaundiced.     Findings: No rash.  Neurological:     Mental Status: She is alert and oriented to person, place, and time.     Cranial Nerves: No cranial nerve deficit.  Psychiatric:        Mood and Affect: Mood normal.        Behavior: Behavior normal.        Thought Content: Thought content normal.     LABS:      Latest Ref Rng & Units 12/27/2022    9:05 AM 12/06/2022   12:00 AM 11/24/2022    12:00 AM  CBC  WBC 4.0 - 10.5 K/uL 7.7  5.2     6.8      Hemoglobin 12.0 - 15.0 g/dL 09.8  11.9     14.7      Hematocrit 36.0 - 46.0 % 35.5  37     36      Platelets 150 - 400 K/uL 156  165  C    195        C Corrected result   This result is from an external source.      Latest Ref Rng & Units 12/27/2022    9:05 AM 12/06/2022   12:00 AM 11/24/2022   12:00 AM  CMP  Glucose 70 - 99 mg/dL 829     BUN 8 - 23 mg/dL 23  21     22       Creatinine 0.44 - 1.00 mg/dL 5.62  1.3     1.3      Sodium 135 - 145 mmol/L 140  137     138      Potassium 3.5 - 5.1  mmol/L 4.0  3.8     3.7      Chloride 98 - 111 mmol/L 107  103     109      CO2 22 - 32 mmol/L 20  28     24       Calcium 8.9 - 10.3 mg/dL 9.5  9.4     9.9      Total Protein 6.5 - 8.1 g/dL 6.5     Total Bilirubin <1.2 mg/dL 0.5     Alkaline Phos 38 - 126 U/L 83  72     70      AST 15 - 41 U/L 22  24     25       ALT 0 - 44 U/L 19  20     20          This result is from an external source.     Lab Results  Component Value Date   CEA1 2.9 09/10/2021   /  CEA  Date Value Ref Range Status  09/10/2021 2.9 0.0 - 4.7 ng/mL Final    Comment:    (NOTE)                             Nonsmokers          <3.9                             Smokers             <5.6 [employer] Electrochemiluminescence Immunoassay (ECLIA) Values obtained with different assay methods or kits cannot be used interchangeably.  Results cannot be interpreted as absolute evidence of the presence or absence of malignant disease. Performed At: [facility] [address] [place], [place] [id number] Jolene Schimke MD HY:[id number]     STUDIES:  No results found.    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  If that happens, she is to call us so that we can schedule her for an ultrasound to look for synovitis.     Essential hypertension 09/09/2016   Gastric cancer (HCC) 09/10/2021   Hyperlipidemia 09/01/2016   Hypertension    Hypothyroidism 09/01/2016   Osteoarthritis of both feet 01/20/2016   Osteoarthritis, hand 01/20/2016   Thyroid disease     Past Surgical History:  Procedure Laterality Date   APPENDECTOMY     LAPAROSCOPIC APPENDECTOMY N/A 04/10/2016   Procedure: APPENDECTOMY LAPAROSCOPIC;  Surgeon: Abigail Miyamoto, MD;  Location: [facility] OR;  Service: General;  Laterality: N/A;    Family History  Problem Relation Age of Onset   Hypertension Mother    Prostate cancer Father        metastatic; d. 52   Brain cancer Sister 30   Breast cancer Sister 92   AAA (abdominal aortic aneurysm) Brother    Leukemia Cousin  x2 maternal female cousins; d. before 83   Breast cancer Daughter 57       DCIS    Social History:  reports that she has never smoked. She has never used smokeless tobacco. She reports that she does not currently use alcohol. She reports that she does not use drugs.The patient is accompanied by her grandson today.  Allergies:  Allergies  Allergen Reactions   Doxycycline Rash   Sulfa Antibiotics Rash and Other (See Comments)    Other reaction(s): Other (See Comments)  "Made me feel weird"    Current Medications: Current Outpatient Medications  Medication Sig Dispense Refill   aspirin 81 MG EC tablet Take 81 mg by mouth daily. Swallow whole.     b complex vitamins capsule Take 1 capsule by mouth daily.     Calcium Carbonate (CALCIUM 600 PO) Take 1 tablet by mouth  daily.     Cholecalciferol (VITAMIN D3) 5000 units CAPS Take 1 capsule by mouth daily.     EXFORGE HCT 5-160-12.5 MG TABS Take 1 tablet by mouth daily.     famotidine (PEPCID) 40 MG tablet Take 40 mg by mouth at bedtime.     gabapentin (NEURONTIN) 100 MG capsule Take 1 capsule (100 mg total) by mouth at bedtime. For 2 weeks, then 2 capsules by mouth at bedtime 50 capsule 5   KRILL OIL PO Take 1 capsule by mouth daily. Unknown strenght     Lactobacillus TABS Take 1 tablet by mouth 2 (two) times daily.     levothyroxine (SYNTHROID, LEVOTHROID) 75 MCG tablet Take 75 mcg by mouth daily before breakfast.      NON FORMULARY MMW: 3 parts Maalox 2 parts Benadryl 1 part viscious lidicaine  Disp. 6oz  Instructions: 5ml swish and swallow every 3-4 hours     omeprazole (PRILOSEC) 40 MG capsule Take 1 capsule (40 mg total) by mouth 2 (two) times daily. 60 capsule 5   ondansetron (ZOFRAN-ODT) 4 MG disintegrating tablet Take 1 tablet (4 mg total) by mouth every 8 (eight) hours as needed for nausea or vomiting. 90 tablet 0   polyethylene glycol powder (GLYCOLAX/MIRALAX) 17 GM/SCOOP powder SMARTSIG:1 scoopful By Mouth Daily     potassium chloride SA (KLOR-CON M) 20 MEQ tablet Take 1 tablet (20 mEq total) by mouth 2 (two) times daily. 180 tablet 1   pravastatin (PRAVACHOL) 20 MG tablet Take 1 tablet (20 mg total) by mouth every evening. 90 tablet 3   Probiotic Product (PROBIOTIC DAILY PO) Take 1 tablet by mouth daily.     prochlorperazine (COMPAZINE) 10 MG tablet Take 1 tablet (10 mg total) by mouth every 6 (six) hours as needed for nausea or vomiting. 90 tablet 3   triamcinolone 0.1% oint-Eucerin equivalent cream 1:1 mixture Apply topically 2 (two) times daily. 160 g 0   No current facility-administered medications for this visit.   Facility-Administered Medications Ordered in Other Visits  Medication Dose Route Frequency Provider Last Rate Last Admin   0.9 %  sodium chloride infusion   Intravenous Once  Dellia Beckwith, MD 500 mL/hr at 12/27/22 1026 New Bag at 12/27/22 1026   dexamethasone (DECADRON) injection 10 mg  10 mg Intravenous Once Dellia Beckwith, MD       fluorouracil (ADRUCIL) 3,500 mg in sodium chloride 0.9 % 80 mL chemo infusion  1,920 mg/m2 (Order-Specific) Intravenous 1 day or 1 dose Dellia Beckwith, MD       fluorouracil (ADRUCIL) chemo injection 650 mg  320  mg/m2 (Order-Specific) Intravenous Once Dellia Beckwith, MD       leucovorin 630 mg in dextrose 5 % 250 mL infusion  320 mg/m2 (Order-Specific) Intravenous Once Dellia Beckwith, MD       nivolumab (OPDIVO) 240 mg in sodium chloride 0.9 % 100 mL chemo infusion  240 mg Intravenous Once Dellia Beckwith, MD       palonosetron (ALOXI) injection 0.25 mg  0.25 mg Intravenous Once Dellia Beckwith, MD

## 2022-12-27 NOTE — Patient Instructions (Signed)
[facility]  Discharge Instructions: Thank you for choosing [facility] to provide your oncology and hematology care.  If you have a lab appointment with the Cancer Center, please go directly to the Cancer Center and check in at the registration area.   Wear comfortable clothing and clothing appropriate for easy access to any Portacath or PICC line.   We strive to give you quality time with your provider. You may need to reschedule your appointment if you arrive late (15 or more minutes).  Arriving late affects you and other patients whose appointments are after yours.  Also, if you miss three or more appointments without notifying the office, you may be dismissed from the clinic at the provider's discretion.      For prescription refill requests, have your pharmacy contact our office and allow 72 hours for refills to be completed.    Today you received the following chemotherapy and/or immunotherapy agents Opdivo, leucovorin, flurouracil       To help prevent nausea and vomiting after your treatment, we encourage you to take your nausea medication as directed.  BELOW ARE SYMPTOMS THAT SHOULD BE REPORTED IMMEDIATELY: *FEVER GREATER THAN 100.4 F (38 C) OR HIGHER *CHILLS OR SWEATING *NAUSEA AND VOMITING THAT IS NOT CONTROLLED WITH YOUR NAUSEA MEDICATION *UNUSUAL SHORTNESS OF BREATH *UNUSUAL BRUISING OR BLEEDING *URINARY PROBLEMS (pain or burning when urinating, or frequent urination) *BOWEL PROBLEMS (unusual diarrhea, constipation, pain near the anus) TENDERNESS IN MOUTH AND THROAT WITH OR WITHOUT PRESENCE OF ULCERS (sore throat, sores in mouth, or a toothache) UNUSUAL RASH, SWELLING OR PAIN  UNUSUAL VAGINAL DISCHARGE OR ITCHING   Items with * indicate a potential emergency and should be followed up as soon as possible or go to the Emergency Department if any problems should occur.  Please show the CHEMOTHERAPY ALERT  CARD or IMMUNOTHERAPY ALERT CARD at check-in to the Emergency Department and triage nurse.  Should you have questions after your visit or need to cancel or reschedule your appointment, please contact [facility] - A DEPT OF [facility]. [facility]  Dept: [phone]  and follow the prompts.  Office hours are 8:00 a.m. to 4:30 p.m. Monday - Friday. Please note that voicemails left after 4:00 p.m. may not be returned until the following business day.  We are closed weekends and major holidays. You have access to a nurse at all times for urgent questions. Please call the main number to the clinic Dept: [phone] and follow the prompts.  For any non-urgent questions, you may also contact your provider using MyChart. We now offer e-Visits for anyone 81 and older to request care online for non-urgent symptoms. For details visit [email or web address].   Also download the MyChart app! Go to the app store, search "MyChart", open the app, select [place], and log in with your MyChart username and password.

## 2022-12-27 NOTE — Assessment & Plan Note (Signed)
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  Her creatinine is stable.  We have continued to give her IV fluids the week of her chemotherapy.

## 2022-12-28 LAB — T4: T4, Total: 12.4 ug/dL — ABNORMAL HIGH (ref 4.5–12.0)

## 2022-12-29 ENCOUNTER — Inpatient Hospital Stay: Payer: Medicare Other

## 2022-12-29 VITALS — BP 125/69 | HR 81 | Temp 97.8°F | Resp 20

## 2022-12-29 DIAGNOSIS — Z5111 Encounter for antineoplastic chemotherapy: Secondary | ICD-10-CM | POA: Diagnosis not present

## 2022-12-29 DIAGNOSIS — Z79899 Other long term (current) drug therapy: Secondary | ICD-10-CM | POA: Diagnosis not present

## 2022-12-29 DIAGNOSIS — C169 Malignant neoplasm of stomach, unspecified: Secondary | ICD-10-CM | POA: Diagnosis not present

## 2022-12-29 DIAGNOSIS — C168 Malignant neoplasm of overlapping sites of stomach: Secondary | ICD-10-CM

## 2022-12-29 MED ORDER — SODIUM CHLORIDE 0.9 % IV SOLN: Freq: Once | INTRAVENOUS | Status: AC

## 2022-12-29 MED ORDER — HEPARIN SOD (PORK) LOCK FLUSH 100 UNIT/ML IV SOLN
500.0000 [IU] | Freq: Once | INTRAVENOUS | Status: AC | PRN
  Administered 2022-12-29: 500 [IU]

## 2022-12-29 MED ORDER — SODIUM CHLORIDE 0.9% FLUSH
10.0000 mL | INTRAVENOUS | Status: DC | PRN
Start: 2022-12-29 — End: 2022-12-29
  Administered 2022-12-29: 10 mL

## 2022-12-29 NOTE — Patient Instructions (Signed)
[facility]  Discharge Instructions: Thank you for choosing [facility] to provide your oncology and hematology care.  If you have a lab appointment with the Cancer Center, please go directly to the Cancer Center and check in at the registration area.   Wear comfortable clothing and clothing appropriate for easy access to any Portacath or PICC line.   We strive to give you quality time with your provider. You may need to reschedule your appointment if you arrive late (15 or more minutes).  Arriving late affects you and other patients whose appointments are after yours.  Also, if you miss three or more appointments without notifying the office, you may be dismissed from the clinic at the provider's discretion.      For prescription refill requests, have your pharmacy contact our office and allow 72 hours for refills to be completed.    Today you received the following chemotherapy and/or immunotherapy agents    To help prevent nausea and vomiting after your treatment, we encourage you to take your nausea medication as directed.  BELOW ARE SYMPTOMS THAT SHOULD BE REPORTED IMMEDIATELY: *FEVER GREATER THAN 100.4 F (38 C) OR HIGHER *CHILLS OR SWEATING *NAUSEA AND VOMITING THAT IS NOT CONTROLLED WITH YOUR NAUSEA MEDICATION *UNUSUAL SHORTNESS OF BREATH *UNUSUAL BRUISING OR BLEEDING *URINARY PROBLEMS (pain or burning when urinating, or frequent urination) *BOWEL PROBLEMS (unusual diarrhea, constipation, pain near the anus) TENDERNESS IN MOUTH AND THROAT WITH OR WITHOUT PRESENCE OF ULCERS (sore throat, sores in mouth, or a toothache) UNUSUAL RASH, SWELLING OR PAIN  UNUSUAL VAGINAL DISCHARGE OR ITCHING   Items with * indicate a potential emergency and should be followed up as soon as possible or go to the Emergency Department if any problems should occur.  Please show the CHEMOTHERAPY ALERT CARD or IMMUNOTHERAPY ALERT CARD at  check-in to the Emergency Department and triage nurse.  Should you have questions after your visit or need to cancel or reschedule your appointment, please contact [facility] - A DEPT OF [facility]. [facility]  Dept: [phone]  and follow the prompts.  Office hours are 8:00 a.m. to 4:30 p.m. Monday - Friday. Please note that voicemails left after 4:00 p.m. may not be returned until the following business day.  We are closed weekends and major holidays. You have access to a nurse at all times for urgent questions. Please call the main number to the clinic Dept: [phone] and follow the prompts.  For any non-urgent questions, you may also contact your provider using MyChart. We now offer e-Visits for anyone 52 and older to request care online for non-urgent symptoms. For details visit [email or web address].   Also download the MyChart app! Go to the app store, search "MyChart", open the app, select [place], and log in with your MyChart username and password.

## 2022-12-30 ENCOUNTER — Encounter: Payer: Self-pay | Admitting: Oncology

## 2022-12-31 ENCOUNTER — Encounter: Payer: Self-pay | Admitting: Oncology

## 2023-01-10 ENCOUNTER — Inpatient Hospital Stay: Payer: Medicare Other

## 2023-01-10 ENCOUNTER — Telehealth: Payer: Self-pay | Admitting: Hematology and Oncology

## 2023-01-10 ENCOUNTER — Encounter: Payer: Self-pay | Admitting: Oncology

## 2023-01-10 ENCOUNTER — Encounter: Payer: Self-pay | Admitting: Hematology and Oncology

## 2023-01-10 ENCOUNTER — Inpatient Hospital Stay (HOSPITAL_BASED_OUTPATIENT_CLINIC_OR_DEPARTMENT_OTHER): Payer: Medicare Other | Admitting: Hematology and Oncology

## 2023-01-10 VITALS — BP 136/61 | HR 89 | Temp 97.8°F | Resp 18 | Ht 60.0 in | Wt 204.2 lb

## 2023-01-10 DIAGNOSIS — Z79899 Other long term (current) drug therapy: Secondary | ICD-10-CM | POA: Diagnosis not present

## 2023-01-10 DIAGNOSIS — C168 Malignant neoplasm of overlapping sites of stomach: Secondary | ICD-10-CM

## 2023-01-10 DIAGNOSIS — N142 Nephropathy induced by unspecified drug, medicament or biological substance: Secondary | ICD-10-CM

## 2023-01-10 DIAGNOSIS — C169 Malignant neoplasm of stomach, unspecified: Secondary | ICD-10-CM | POA: Diagnosis not present

## 2023-01-10 DIAGNOSIS — Z5111 Encounter for antineoplastic chemotherapy: Secondary | ICD-10-CM | POA: Diagnosis not present

## 2023-01-10 LAB — CBC WITH DIFFERENTIAL (CANCER CENTER ONLY)
Abs Immature Granulocytes: 0.02 10*3/uL (ref 0.00–0.07)
Basophils Absolute: 0 10*3/uL (ref 0.0–0.1)
Basophils Relative: 1 %
Eosinophils Absolute: 0.3 10*3/uL (ref 0.0–0.5)
Eosinophils Relative: 5 %
HCT: 34.9 % — ABNORMAL LOW (ref 36.0–46.0)
Hemoglobin: 12.2 g/dL (ref 12.0–15.0)
Immature Granulocytes: 0 %
Lymphocytes Relative: 28 %
Lymphs Abs: 1.7 10*3/uL (ref 0.7–4.0)
MCH: 30.7 pg (ref 26.0–34.0)
MCHC: 35 g/dL (ref 30.0–36.0)
MCV: 87.9 fL (ref 80.0–100.0)
Monocytes Absolute: 0.5 10*3/uL (ref 0.1–1.0)
Monocytes Relative: 9 %
Neutro Abs: 3.5 10*3/uL (ref 1.7–7.7)
Neutrophils Relative %: 57 %
Platelet Count: 162 10*3/uL (ref 150–400)
RBC: 3.97 MIL/uL (ref 3.87–5.11)
RDW: 15.2 % (ref 11.5–15.5)
WBC Count: 6.2 10*3/uL (ref 4.0–10.5)
nRBC: 0 % (ref 0.0–0.2)
nRBC: 0 /100{WBCs}

## 2023-01-10 LAB — CMP (CANCER CENTER ONLY)
ALT: 19 U/L (ref 0–44)
AST: 21 U/L (ref 15–41)
Albumin: 4.1 g/dL (ref 3.5–5.0)
Alkaline Phosphatase: 88 U/L (ref 38–126)
Anion gap: 12 (ref 5–15)
BUN: 27 mg/dL — ABNORMAL HIGH (ref 8–23)
CO2: 22 mmol/L (ref 22–32)
Calcium: 9.8 mg/dL (ref 8.9–10.3)
Chloride: 105 mmol/L (ref 98–111)
Creatinine: 1.35 mg/dL — ABNORMAL HIGH (ref 0.44–1.00)
GFR, Estimated: 38 mL/min — ABNORMAL LOW (ref >60)
Glucose, Bld: 161 mg/dL — ABNORMAL HIGH (ref 70–99)
Potassium: 4.1 mmol/L (ref 3.5–5.1)
Sodium: 138 mmol/L (ref 135–145)
Total Bilirubin: 0.5 mg/dL (ref <1.2)
Total Protein: 6.3 g/dL — ABNORMAL LOW (ref 6.5–8.1)

## 2023-01-10 LAB — TSH: TSH: 2.296 u[IU]/mL (ref 0.350–4.500)

## 2023-01-10 MED ORDER — SODIUM CHLORIDE 0.9 % IV SOLN
Freq: Once | INTRAVENOUS | Status: AC
Start: 2023-01-10 — End: 2023-01-10

## 2023-01-10 MED ORDER — SODIUM CHLORIDE 0.9 % IV SOLN
1960.0000 mg/m2 | INTRAVENOUS | Status: DC
  Administered 2023-01-10: 3850 mg via INTRAVENOUS
  Filled 2023-01-10: qty 77

## 2023-01-10 MED ORDER — PALONOSETRON HCL INJECTION 0.25 MG/5ML
0.2500 mg | Freq: Once | INTRAVENOUS | Status: AC
  Administered 2023-01-10: 0.25 mg via INTRAVENOUS
  Filled 2023-01-10: qty 5

## 2023-01-10 MED ORDER — SODIUM CHLORIDE 0.9 % IV SOLN
240.0000 mg | Freq: Once | INTRAVENOUS | Status: AC
  Administered 2023-01-10: 240 mg via INTRAVENOUS
  Filled 2023-01-10: qty 24

## 2023-01-10 MED ORDER — FLUOROURACIL CHEMO INJECTION 2.5 GM/50ML
320.0000 mg/m2 | Freq: Once | INTRAVENOUS | Status: AC
  Administered 2023-01-10: 650 mg via INTRAVENOUS
  Filled 2023-01-10: qty 13

## 2023-01-10 MED ORDER — HEPARIN SOD (PORK) LOCK FLUSH 100 UNIT/ML IV SOLN
500.0000 [IU] | Freq: Once | INTRAVENOUS | Status: DC | PRN
Start: 2023-01-10 — End: 2023-01-10

## 2023-01-10 MED ORDER — SODIUM CHLORIDE 0.9% FLUSH: 10.0000 mL | INTRAVENOUS | Status: DC | PRN

## 2023-01-10 MED ORDER — DEXAMETHASONE SODIUM PHOSPHATE 10 MG/ML IJ SOLN
10.0000 mg | Freq: Once | INTRAMUSCULAR | Status: AC
  Administered 2023-01-10: 10 mg via INTRAVENOUS
  Filled 2023-01-10: qty 1

## 2023-01-10 MED ORDER — LEUCOVORIN CALCIUM INJECTION 350 MG
320.0000 mg/m2 | Freq: Once | INTRAVENOUS | Status: AC
  Administered 2023-01-10: 630 mg via INTRAVENOUS
  Filled 2023-01-10: qty 31.5

## 2023-01-10 NOTE — Assessment & Plan Note (Signed)
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  She has had chronic kidney disease since that episode.  Her creatinine is stable.  We have continued to give her IV fluids the week of her chemotherapy.

## 2023-01-10 NOTE — Assessment & Plan Note (Signed)
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  Stain for HER2 was negative.  PET scan revealed omental involvement, but no distant metastasis was seen. She is receiving palliative chemotherapy with FOLFOX/nivolumab (5-fluorouracil/leucovorin/oxaliplatin/nivolumab), which now consists of 5-fluorouracil/leucovorin/nivolumab.  Oxaliplatin was discontinued after 11 cycles.  PET in February 2024 revealed resolution of metabolic activity associated with the stomach.  Decrease in size of omental nodularity. No associated metabolic activity.   EGD in March revealed residual disease.  CT chest, abdomen and pelvis in July revealed stable disease with persistent mild gastric wall thickening and subtle adjacent omental nodularity.  CT chest, abdomen pelvis and October revealed persistent wall thickening along the distal stomach with the adjacent stranding and soft tissue thickening, no additional areas of peritoneal nodularity or developing lymph node enlargement, no liver lesion and fatty liver infiltration. Once again changes interstitial lung disease with fibrosis, bronchiectasis were seen, as well as stable slight asymmetric thickening along the gallbladder, but the gallbladder is under-distended.  Her disease remains stable with treatment but still persists, so we recommended we continue the maintenance treatment and repeat imaging in 3 months.   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  She receives extra IV fluids with her infusion to prevent recurrent acute kidney injury.  Her kidney function is stable.  She will proceed with 28th of 5-fluorouracil/leucovorin/nivolumab cycle today.  The patient initially wanted to skip this week of Thanksgiving, but then decided to proceed.  I will plan to see her back in 2  weeks with a CBC, comprehensive metabolic panel, and TSH prior to a 29th cycle.

## 2023-01-10 NOTE — Telephone Encounter (Signed)
01/10/23 Next appt scheduled and confirmed with patient.

## 2023-01-10 NOTE — Patient Instructions (Signed)
Fluorouracil Injection What is this medication? FLUOROURACIL (flure oh YOOR a sil) treats some types of cancer. It works by slowing down the growth of cancer cells. This medicine may be used for other purposes; ask your health care provider or pharmacist if you have questions. COMMON BRAND NAME(S): Adrucil What should I tell my care team before I take this medication? They need to know if you have any of these conditions: Blood disorders Dihydropyrimidine dehydrogenase (DPD) deficiency Infection, such as chickenpox, cold sores, herpes Kidney disease Liver disease Poor nutrition Recent or ongoing radiation therapy An unusual or allergic reaction to fluorouracil, other medications, foods, dyes, or preservatives If you or your partner are pregnant or trying to get pregnant Breast-feeding How should I use this medication? This medication is injected into a vein. It is administered by your care team in a hospital or clinic setting. Talk to your care team about the use of this medication in children. Special care may be needed. Overdosage: If you think you have taken too much of this medicine contact a poison control center or emergency room at once. NOTE: This medicine is only for you. Do not share this medicine with others. What if I miss a dose? Keep appointments for follow-up doses. It is important not to miss your dose. Call your care team if you are unable to keep an appointment. What may interact with this medication? Do not take this medication with any of the following: Live virus vaccines This medication may also interact with the following: Medications that treat or prevent blood clots, such as warfarin, enoxaparin, dalteparin This list may not describe all possible interactions. Give your health care provider a list of all the medicines, herbs, non-prescription drugs, or dietary supplements you use. Also tell them if you smoke, drink alcohol, or use illegal drugs. Some items may  interact with your medicine. What should I watch for while using this medication? Your condition will be monitored carefully while you are receiving this medication. This medication may make you feel generally unwell. This is not uncommon as chemotherapy can affect healthy cells as well as cancer cells. Report any side effects. Continue your course of treatment even though you feel ill unless your care team tells you to stop. In some cases, you may be given additional medications to help with side effects. Follow all directions for their use. This medication may increase your risk of getting an infection. Call your care team for advice if you get a fever, chills, sore throat, or other symptoms of a cold or flu. Do not treat yourself. Try to avoid being around people who are sick. This medication may increase your risk to bruise or bleed. Call your care team if you notice any unusual bleeding. Be careful brushing or flossing your teeth or using a toothpick because you may get an infection or bleed more easily. If you have any dental work done, tell your dentist you are receiving this medication. Avoid taking medications that contain aspirin, acetaminophen, ibuprofen, naproxen, or ketoprofen unless instructed by your care team. These medications may hide a fever. Do not treat diarrhea with over the counter products. Contact your care team if you have diarrhea that lasts more than 2 days or if it is severe and watery. This medication can make you more sensitive to the sun. Keep out of the sun. If you cannot avoid being in the sun, wear protective clothing and sunscreen. Do not use sun lamps, tanning beds, or tanning booths. Talk to  your care team if you or your partner wish to become pregnant or think you might be pregnant. This medication can cause serious birth defects if taken during pregnancy and for 3 months after the last dose. A reliable form of contraception is recommended while taking this  medication and for 3 months after the last dose. Talk to your care team about effective forms of contraception. Do not father a child while taking this medication and for 3 months after the last dose. Use a condom while having sex during this time period. Do not breastfeed while taking this medication. This medication may cause infertility. Talk to your care team if you are concerned about your fertility. What side effects may I notice from receiving this medication? Side effects that you should report to your care team as soon as possible: Allergic reactions--skin rash, itching, hives, swelling of the face, lips, tongue, or throat Heart attack--pain or tightness in the chest, shoulders, arms, or jaw, nausea, shortness of breath, cold or clammy skin, feeling faint or lightheaded Heart failure--shortness of breath, swelling of the ankles, feet, or hands, sudden weight gain, unusual weakness or fatigue Heart rhythm changes--fast or irregular heartbeat, dizziness, feeling faint or lightheaded, chest pain, trouble breathing High ammonia level--unusual weakness or fatigue, confusion, loss of appetite, nausea, vomiting, seizures Infection--fever, chills, cough, sore throat, wounds that don't heal, pain or trouble when passing urine, general feeling of discomfort or being unwell Low red blood cell level--unusual weakness or fatigue, dizziness, headache, trouble breathing Pain, tingling, or numbness in the hands or feet, muscle weakness, change in vision, confusion or trouble speaking, loss of balance or coordination, trouble walking, seizures Redness, swelling, and blistering of the skin over hands and feet Severe or prolonged diarrhea Unusual bruising or bleeding Side effects that usually do not require medical attention (report to your care team if they continue or are bothersome): Dry skin Headache Increased tears Nausea Pain, redness, or swelling with sores inside the mouth or throat Sensitivity  to light Vomiting This list may not describe all possible side effects. Call your doctor for medical advice about side effects. You may report side effects to FDA at 1-800-FDA-1088. Where should I keep my medication? This medication is given in a hospital or clinic. It will not be stored at home. NOTE: This sheet is a summary. It may not cover all possible information. If you have questions about this medicine, talk to your doctor, pharmacist, or health care provider.  2024 [employer]/Gold Standard (2021-06-09 00:00:00) Leucovorin Injection What is this medication? LEUCOVORIN (loo koe VOR in) prevents side effects from certain medications, such as methotrexate. It works by increasing folate levels. This helps protect healthy cells in your body. It may also be used to treat anemia caused by low levels of folate. It can also be used with fluorouracil, a type of chemotherapy, to treat colorectal cancer. It works by increasing the effects of fluorouracil in the body. This medicine may be used for other purposes; ask your health care provider or pharmacist if you have questions. What should I tell my care team before I take this medication? They need to know if you have any of these conditions: Anemia from low levels of vitamin B12 in the blood An unusual or allergic reaction to leucovorin, folic acid, other medications, foods, dyes, or preservatives Pregnant or trying to get pregnant Breastfeeding How should I use this medication? This medication is injected into a vein or a muscle. It is given by your care team  in a hospital or clinic setting. Talk to your care team about the use of this medication in children. Special care may be needed. Overdosage: If you think you have taken too much of this medicine contact a poison control center or emergency room at once. NOTE: This medicine is only for you. Do not share this medicine with others. What if I miss a dose? Keep appointments for follow-up doses.  It is important not to miss your dose. Call your care team if you are unable to keep an appointment. What may interact with this medication? Capecitabine Fluorouracil Phenobarbital Phenytoin Primidone Trimethoprim;sulfamethoxazole This list may not describe all possible interactions. Give your health care provider a list of all the medicines, herbs, non-prescription drugs, or dietary supplements you use. Also tell them if you smoke, drink alcohol, or use illegal drugs. Some items may interact with your medicine. What should I watch for while using this medication? Your condition will be monitored carefully while you are receiving this medication. This medication may increase the side effects of 5-fluorouracil. Tell your care team if you have diarrhea or mouth sores that do not get better or that get worse. What side effects may I notice from receiving this medication? Side effects that you should report to your care team as soon as possible: Allergic reactions--skin rash, itching, hives, swelling of the face, lips, tongue, or throat This list may not describe all possible side effects. Call your doctor for medical advice about side effects. You may report side effects to FDA at 1-800-FDA-1088. Where should I keep my medication? This medication is given in a hospital or clinic. It will not be stored at home. NOTE: This sheet is a summary. It may not cover all possible information. If you have questions about this medicine, talk to your doctor, pharmacist, or health care provider.  2024 [employer]/Gold Standard (2021-07-07 00:00:00) Nivolumab Injection What is this medication? NIVOLUMAB (nye VOL ue mab) treats some types of cancer. It works by helping your immune system slow or stop the spread of cancer cells. It is a monoclonal antibody. This medicine may be used for other purposes; ask your health care provider or pharmacist if you have questions. COMMON BRAND NAME(S): Opdivo What should I  tell my care team before I take this medication? They need to know if you have any of these conditions: Allogeneic stem cell transplant (uses someone else's stem cells) Autoimmune diseases, such as Crohn disease, ulcerative colitis, lupus History of chest radiation Nervous system problems, such as Guillain-Barre syndrome or myasthenia gravis Organ transplant An unusual or allergic reaction to nivolumab, other medications, foods, dyes, or preservatives Pregnant or trying to get pregnant Breast-feeding How should I use this medication? This medication is infused into a vein. It is given in a hospital or clinic setting. A special MedGuide will be given to you before each treatment. Be sure to read this information carefully each time. Talk to your care team about the use of this medication in children. While it may be prescribed for children as young as 12 years for selected conditions, precautions do apply. Overdosage: If you think you have taken too much of this medicine contact a poison control center or emergency room at once. NOTE: This medicine is only for you. Do not share this medicine with others. What if I miss a dose? Keep appointments for follow-up doses. It is important not to miss your dose. Call your care team if you are unable to keep an appointment. What may  interact with this medication? Interactions have not been studied. This list may not describe all possible interactions. Give your health care provider a list of all the medicines, herbs, non-prescription drugs, or dietary supplements you use. Also tell them if you smoke, drink alcohol, or use illegal drugs. Some items may interact with your medicine. What should I watch for while using this medication? Your condition will be monitored carefully while you are receiving this medication. You may need blood work while taking this medication. This medication may cause serious skin reactions. They can happen weeks to months after  starting the medication. Contact your care team right away if you notice fevers or flu-like symptoms with a rash. The rash may be red or purple and then turn into blisters or peeling of the skin. You may also notice a red rash with swelling of the face, lips, or lymph nodes in your neck or under your arms. Tell your care team right away if you have any change in your eyesight. Talk to your care team if you are pregnant or think you might be pregnant. A negative pregnancy test is required before starting this medication. A reliable form of contraception is recommended while taking this medication and for 5 months after the last dose. Talk to your care team about effective forms of contraception. Do not breast-feed while taking this medication and for 5 months after the last dose. What side effects may I notice from receiving this medication? Side effects that you should report to your care team as soon as possible: Allergic reactions--skin rash, itching, hives, swelling of the face, lips, tongue, or throat Dry cough, shortness of breath or trouble breathing Eye pain, redness, irritation, or discharge with blurry or decreased vision Heart muscle inflammation--unusual weakness or fatigue, shortness of breath, chest pain, fast or irregular heartbeat, dizziness, swelling of the ankles, feet, or hands Hormone gland problems--headache, sensitivity to light, unusual weakness or fatigue, dizziness, fast or irregular heartbeat, increased sensitivity to cold or heat, excessive sweating, constipation, hair loss, increased thirst or amount of urine, tremors or shaking, irritability Infusion reactions--chest pain, shortness of breath or trouble breathing, feeling faint or lightheaded Kidney injury (glomerulonephritis)--decrease in the amount of urine, red or dark brown urine, foamy or bubbly urine, swelling of the ankles, hands, or feet Liver injury--right upper belly pain, loss of appetite, nausea, light-colored  stool, dark yellow or brown urine, yellowing skin or eyes, unusual weakness or fatigue Pain, tingling, or numbness in the hands or feet, muscle weakness, change in vision, confusion or trouble speaking, loss of balance or coordination, trouble walking, seizures Rash, fever, and swollen lymph nodes Redness, blistering, peeling, or loosening of the skin, including inside the mouth Sudden or severe stomach pain, bloody diarrhea, fever, nausea, vomiting Side effects that usually do not require medical attention (report these to your care team if they continue or are bothersome): Bone, joint, or muscle pain Diarrhea Fatigue Loss of appetite Nausea Skin rash This list may not describe all possible side effects. Call your doctor for medical advice about side effects. You may report side effects to FDA at 1-800-FDA-1088. Where should I keep my medication? This medication is given in a hospital or clinic. It will not be stored at home. NOTE: This sheet is a summary. It may not cover all possible information. If you have questions about this medicine, talk to your doctor, pharmacist, or health care provider.  2024 [employer]/Gold Standard (2021-06-01 00:00:00)

## 2023-01-10 NOTE — Progress Notes (Signed)
[name]  [address] [place],  [place]  [address] [phone]  Clinic Day:  01/10/2023  Referring physician: Philemon Kingdom, MD  ASSESSMENT & PLAN:   Assessment & Plan: Gastric cancer (HCC) Stage IVB (T4 N0 M1) poorly differentiated adenocarcinoma of the stomach with signet ring features and ulceration, metastatic to the omentum.  Stain for HER2 was negative.  PET scan revealed omental involvement, but no distant metastasis was seen. She is receiving palliative chemotherapy with FOLFOX/nivolumab (5-fluorouracil/leucovorin/oxaliplatin/nivolumab), which now consists of 5-fluorouracil/leucovorin/nivolumab.  Oxaliplatin was discontinued after 11 cycles.  PET in February 2024 revealed resolution of metabolic activity associated with the stomach.  Decrease in size of omental nodularity. No associated metabolic activity.   EGD in March revealed residual disease.  CT chest, abdomen and pelvis in July revealed stable disease with persistent mild gastric wall thickening and subtle adjacent omental nodularity.  CT chest, abdomen pelvis and October revealed persistent wall thickening along the distal stomach with the adjacent stranding and soft tissue thickening, no additional areas of peritoneal nodularity or developing lymph node enlargement, no liver lesion and fatty liver infiltration. Once again changes interstitial lung disease with fibrosis, bronchiectasis were seen, as well as stable slight asymmetric thickening along the gallbladder, but the gallbladder is under-distended.  Her disease remains stable with treatment but still persists, so we recommended we continue the maintenance treatment and repeat imaging in 3 months.   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  She receives extra IV fluids with her infusion to prevent recurrent acute kidney injury.  Her  kidney function is stable.  She will proceed with 28th of 5-fluorouracil/leucovorin/nivolumab cycle today.  The patient initially wanted to skip this week of Thanksgiving, but then decided to proceed.  I will plan to see her back in 2 weeks with a CBC, comprehensive metabolic panel, and TSH prior to a 29th cycle.  Drug-induced interstitial nephritis History immune mediated nephritis from immunotherapy in February, which resolved with prednisone.  She has had chronic kidney disease since that episode.  Her creatinine is stable.  We have continued to give her IV fluids the week of her chemotherapy.      The patient understands the plans discussed today and is in agreement with them.  She knows to contact our office if she develops concerns prior to her next appointment.   I provided 30 minutes of face-to-face time during this encounter and > 50% was spent counseling as documented under my assessment and plan.    Adah Perl, PA-C  [facility] - A DEPT OF [facility] [address] [place] [address] Dept: [phone] Dept Fax: [phone]   No orders of the defined types were placed in this encounter.     CHIEF COMPLAINT:  CC: Metastatic gastric cancer  Current Treatment: 5-fluorouracil/leucovorin/nivolumab every 2 weeks  HISTORY OF PRESENT ILLNESS:   Oncology History  Gastric cancer (HCC)  09/10/2021 Initial Diagnosis   Gastric cancer (HCC)   09/10/2021 Cancer Staging   Staging form: Stomach, AJCC 8th Edition - Clinical stage from 09/10/2021: Stage IVB (cT4b, cN0, cM1) - Signed by Dellia Beckwith, MD on 09/10/2021 Histopathologic type: Adenocarcinoma, NOS Stage prefix: Initial diagnosis Total positive nodes: 0 Histologic grade (G): G3 Histologic grading system: 3 grade system Sites of metastasis: Peritoneal surface Diagnostic confirmation: Positive histology PLUS positive immunophenotyping and/or positive  genetic studies Specimen type: Endoscopy with Biopsy Staged by: Managing physician Carcinoembryonic antigen (CEA) (ng/mL): 2.8 Carbohydrate antigen 19-9 (CA 19-9) (U/mL): 4.9 HER2 status: Unknown Microsatellite instability (MSI): Unknown Tumor location in stomach: Other Clinical staging modalities: Biopsy, Endoscopy Stage used in treatment planning: Yes National guidelines used in treatment planning: Yes Type of national guideline used in treatment planning: NCCN   09/28/2021 - 10/14/2021 Chemotherapy   Patient is on Treatment Plan : GASTROESOPHAGEAL FOLFOX + Nivolumab q14d     09/28/2021 -  Chemotherapy   Patient is on Treatment Plan : GASTROESOPHAGEAL FOLFOX + Nivolumab q14d     12/09/2021 Genetic Testing   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  Report date is 12/09/2021.   The Multi-Cancer + RNA Panel offered by [facility] includes sequencing and/or deletion/duplication analysis of the following 84 genes:  AIP*, ALK, APC*, ATM*, AXIN2*, BAP1*, BARD1*, BLM*, BMPR1A*, BRCA1*, BRCA2*, BRIP1*, CASR, CDC73*, CDH1*, CDK4, CDKN1B*, CDKN1C*, CDKN2A, CEBPA, CHEK2*, CTNNA1*, DICER1*, DIS3L2*, EGFR, EPCAM, FH*, FLCN*, GATA2*, GPC3, GREM1, HOXB13, HRAS, KIT, MAX*, MEN1*, MET, MITF, MLH1*, MSH2*, MSH3*, MSH6*, MUTYH*, NBN*, NF1*, NF2*, NTHL1*, PALB2*, PDGFRA, PHOX2B, PMS2*, POLD1*, POLE*, POT1*, PRKAR1A*, PTCH1*, PTEN*, RAD50*, RAD51C*, RAD51D*, RB1*, RECQL4, RET, RUNX1*, SDHA*, SDHAF2*, SDHB*, SDHC*, SDHD*, SMAD4*, SMARCA4*, SMARCB1*, SMARCE1*, STK11*, SUFU*, TERC, TERT, TMEM127*, Tp53*, TSC1*, TSC2*, VHL*, WRN*, and WT1.  RNA analysis is performed for * genes.       INTERVAL HISTORY:  Mackenzie Lewis is here today for repeat clinical assessment prior to a 28 cycle of 5-fluorouracil/leucovorin/nivolumab.  She claims to have tolerated last cycle without significant difficulty.  She denies recurrent fevers.  She denies chills or night sweats. She denies pain.  She  reports persistent neuropathy of the hands and feet, which is mainly numbness.  This has not improved with gabapentin 100 mg twice daily.  I told her she could try increasing this to 3 times daily, but sometimes this is not effective for numbness.  She has noted occasional dizziness especially with position changes.  It is only fleeting.  She has mild blurry vision without eye pain or redness.  She has seen the optometrist and corrective lenses were not recommended.  She uses reading glasses.  She denies other neurologic symptoms.  She reports decreased urine stream and occasional stress incontinence with a full bladder.  She denies dysuria, frequency, or hematuria.  Her appetite is good. Her weight has increased 3 pounds over last 2 weeks .  REVIEW OF SYSTEMS:  Review of Systems  Constitutional:  Positive for fatigue. Negative for appetite change, chills, fever and unexpected weight change.  HENT:   Positive for hearing loss (x few weeks, low sounds). Negative for lump/mass, mouth sores, sore throat and trouble swallowing.   Eyes:  Positive for eye problems (mild blurry vision).  Respiratory:  Negative for cough and shortness of breath.   Cardiovascular:  Negative for chest pain and leg swelling.  Gastrointestinal:  Negative for abdominal pain, constipation, diarrhea, nausea and vomiting.  Endocrine: Negative for hot flashes.  Genitourinary:  Positive for bladder incontinence (stress incontinence) and difficulty urinating (Decreased stream). Negative for dysuria, frequency, hematuria and vaginal bleeding.   Musculoskeletal:  Negative for arthralgias, back pain, gait problem and myalgias.  Skin:  Negative for itching and rash.  Neurological:  Positive for dizziness and numbness. Negative for extremity weakness, gait problem, headaches and speech difficulty.  Hematological:  Negative for adenopathy. Does not bruise/bleed easily.  Psychiatric/Behavioral:  Negative for depression and sleep  disturbance.  The patient is not nervous/anxious.      VITALS:  Blood pressure 136/61, pulse 89, temperature 97.8 F (36.6 C), temperature source Oral, resp. rate 18, height 5' (1.524 m), weight 204 lb 3.2 oz (92.6 kg), SpO2 98%.  Wt Readings from Last 3 Encounters:  01/10/23 204 lb 3.2 oz (92.6 kg)  12/27/22 201 lb 1.6 oz (91.2 kg)  12/15/22 204 lb 6 oz (92.7 kg)    Body mass index is 39.88 kg/m.  Performance status (ECOG): 1 - Symptomatic but completely ambulatory  PHYSICAL EXAM:  Physical Exam Vitals and nursing note reviewed.  Constitutional:      General: She is not in acute distress.    Appearance: Normal appearance.  HENT:     Head: Normocephalic and atraumatic.     Mouth/Throat:     Mouth: Mucous membranes are moist.     Pharynx: Oropharynx is clear. No oropharyngeal exudate or posterior oropharyngeal erythema.  Eyes:     General: No scleral icterus.       Right eye: No discharge.        Left eye: No discharge.     Extraocular Movements: Extraocular movements intact.     Conjunctiva/sclera: Conjunctivae normal.     Pupils: Pupils are equal, round, and reactive to light.  Cardiovascular:     Rate and Rhythm: Normal rate and regular rhythm.     Heart sounds: Normal heart sounds. No murmur heard.    No friction rub. No gallop.  Pulmonary:     Effort: Pulmonary effort is normal.     Breath sounds: Normal breath sounds. No wheezing, rhonchi or rales.  Abdominal:     General: There is no distension.     Palpations: Abdomen is soft. There is no hepatomegaly, splenomegaly or mass.     Tenderness: There is no abdominal tenderness.  Musculoskeletal:        General: Normal range of motion.     Cervical back: Normal range of motion and neck supple. No tenderness.     Right lower leg: Edema (trace) present.     Left lower leg: Edema (trace) present.  Lymphadenopathy:     Cervical: No cervical adenopathy.     Upper Body:     Right upper body: No supraclavicular or axillary  adenopathy.     Left upper body: No supraclavicular or axillary adenopathy.     Lower Body: No right inguinal adenopathy. No left inguinal adenopathy.  Skin:    General: Skin is warm and dry.     Coloration: Skin is not jaundiced.     Findings: No rash.  Neurological:     Mental Status: She is alert and oriented to person, place, and time.     Cranial Nerves: No cranial nerve deficit.  Psychiatric:        Mood and Affect: Mood normal.        Behavior: Behavior normal.        Thought Content: Thought content normal.        Judgment: Judgment normal.    LABS:      Latest Ref Rng & Units 01/10/2023    8:44 AM 12/27/2022    9:05 AM 12/06/2022   12:00 AM  CBC  WBC 4.0 - 10.5 K/uL 6.2  7.7  5.2      Hemoglobin 12.0 - 15.0 g/dL 40.9  81.1  91.4      Hematocrit 36.0 - 46.0 % 34.9  35.5  37  Platelets 150 - 400 K/uL 162  156  165  C       C Corrected result   This result is from an external source.      Latest Ref Rng & Units 01/10/2023    8:44 AM 12/27/2022    9:05 AM 12/06/2022   12:00 AM  CMP  Glucose 70 - 99 mg/dL 784  696    BUN 8 - 23 mg/dL 27  23  21       Creatinine 0.44 - 1.00 mg/dL 2.95  2.84  1.3      Sodium 135 - 145 mmol/L 138  140  137      Potassium 3.5 - 5.1 mmol/L 4.1  4.0  3.8      Chloride 98 - 111 mmol/L 105  107  103      CO2 22 - 32 mmol/L 22  20  28       Calcium 8.9 - 10.3 mg/dL 9.8  9.5  9.4      Total Protein 6.5 - 8.1 g/dL 6.3  6.5    Total Bilirubin <1.2 mg/dL 0.5  0.5    Alkaline Phos 38 - 126 U/L 88  83  72      AST 15 - 41 U/L 21  22  24       ALT 0 - 44 U/L 19  19  20          This result is from an external source.     Lab Results  Component Value Date   CEA1 2.9 09/10/2021   /  CEA  Date Value Ref Range Status  09/10/2021 2.9 0.0 - 4.7 ng/mL Final    Comment:    (NOTE)                             Nonsmokers          <3.9                             Smokers             <5.6 [employer] Electrochemiluminescence  Immunoassay (ECLIA) Values obtained with different assay methods or kits cannot be used interchangeably.  Results cannot be interpreted as absolute evidence of the presence or absence of malignant disease. Performed At: [facility] [address] [place], [place] [id number] Jolene Schimke MD VO:[phone]    No results found for: "PSA1" No results found for: "[id number]" No results found for: "CAN125"  No results found for: "TOTALPROTELP", "ALBUMINELP", "A1GS", "A2GS", "BETS", "BETA2SER", "GAMS", "MSPIKE", "SPEI" No results found for: "TIBC", "FERRITIN", "IRONPCTSAT" No results found for: "LDH"  STUDIES:  No results found.    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  If that happens, she is to call us so that we can schedule her for an ultrasound to look for synovitis.  Essential hypertension 09/09/2016   Gastric cancer (HCC) 09/10/2021   Hyperlipidemia 09/01/2016   Hypertension    Hypothyroidism 09/01/2016   Osteoarthritis of both feet 01/20/2016   Osteoarthritis, hand 01/20/2016   Thyroid disease     Past Surgical History:  Procedure Laterality Date   APPENDECTOMY     LAPAROSCOPIC APPENDECTOMY N/A 04/10/2016   Procedure: APPENDECTOMY LAPAROSCOPIC;  Surgeon: Abigail Miyamoto, MD;  Location: [facility] OR;  Service: General;  Laterality: N/A;    Family History  Problem Relation Age of Onset   Hypertension Mother    Prostate cancer Father         metastatic; d. 72   Brain cancer Sister 76   Breast cancer Sister 28   AAA (abdominal aortic aneurysm) Brother    Leukemia Cousin        x2 maternal female cousins; d. before 35   Breast cancer Daughter 15       DCIS    Social History:  reports that she has never smoked. She has never used smokeless tobacco. She reports that she does not currently use alcohol. She reports that she does not use drugs.The patient is accompanied by her daughter today.  Allergies:  Allergies  Allergen Reactions   Doxycycline Rash   Sulfa Antibiotics Rash and Other (See Comments)    Other reaction(s): Other (See Comments)  "Made me feel weird"    Current Medications: Current Outpatient Medications  Medication Sig Dispense Refill   aspirin 81 MG EC tablet Take 81 mg by mouth daily. Swallow whole.     b complex vitamins capsule Take 1 capsule by mouth daily.     Calcium Carbonate (CALCIUM 600 PO) Take 1 tablet by mouth daily.     Cholecalciferol (VITAMIN D3) 5000 units CAPS Take 1 capsule by mouth daily.     EXFORGE HCT 5-160-12.5 MG TABS Take 1 tablet by mouth daily.     famotidine (PEPCID) 40 MG tablet Take 40 mg by mouth at bedtime.     gabapentin (NEURONTIN) 100 MG capsule Take 1 capsule (100 mg total) by mouth at bedtime. For 2 weeks, then 2 capsules by mouth at bedtime (Patient taking differently: Take 100 mg by mouth at bedtime. Taking one in the morning and one at bedtime) 50 capsule 5   KRILL OIL PO Take 1 capsule by mouth daily. Unknown strenght     Lactobacillus TABS Take 1 tablet by mouth 2 (two) times daily.     levothyroxine (SYNTHROID, LEVOTHROID) 75 MCG tablet Take 75 mcg by mouth daily before breakfast.      NON FORMULARY MMW: 3 parts Maalox 2 parts Benadryl 1 part viscious lidicaine  Disp. 6oz  Instructions: 5ml swish and swallow every 3-4 hours     omeprazole (PRILOSEC) 40 MG capsule Take 1 capsule (40 mg total) by mouth 2 (two) times daily. 60 capsule 5   ondansetron  (ZOFRAN-ODT) 4 MG disintegrating tablet Take 1 tablet (4 mg total) by mouth every 8 (eight) hours as needed for nausea or vomiting. 90 tablet 0   polyethylene glycol powder (GLYCOLAX/MIRALAX) 17 GM/SCOOP powder SMARTSIG:1 scoopful By Mouth Daily     potassium chloride SA (KLOR-CON M) 20 MEQ tablet Take 1 tablet (20 mEq total) by mouth 2 (two) times daily. 180 tablet 1   pravastatin (PRAVACHOL) 20 MG tablet Take 1 tablet (20 mg total) by mouth every evening. 90 tablet 3   Probiotic Product (PROBIOTIC DAILY PO) Take 1 tablet by mouth daily.  prochlorperazine (COMPAZINE) 10 MG tablet Take 1 tablet (10 mg total) by mouth every 6 (six) hours as needed for nausea or vomiting. 90 tablet 3   triamcinolone 0.1% oint-Eucerin equivalent cream 1:1 mixture Apply topically 2 (two) times daily. 160 g 0   No current facility-administered medications for this visit.

## 2023-01-11 LAB — T4: T4, Total: 9.6 ug/dL (ref 4.5–12.0)

## 2023-01-12 ENCOUNTER — Inpatient Hospital Stay: Payer: Medicare Other

## 2023-01-12 VITALS — BP 126/48 | HR 77 | Temp 98.4°F | Resp 20 | Ht 60.0 in | Wt 204.1 lb

## 2023-01-12 DIAGNOSIS — C169 Malignant neoplasm of stomach, unspecified: Secondary | ICD-10-CM | POA: Diagnosis not present

## 2023-01-12 DIAGNOSIS — Z79899 Other long term (current) drug therapy: Secondary | ICD-10-CM | POA: Diagnosis not present

## 2023-01-12 DIAGNOSIS — C168 Malignant neoplasm of overlapping sites of stomach: Secondary | ICD-10-CM

## 2023-01-12 DIAGNOSIS — Z5111 Encounter for antineoplastic chemotherapy: Secondary | ICD-10-CM | POA: Diagnosis not present

## 2023-01-12 MED ORDER — SODIUM CHLORIDE 0.9 % IV SOLN
Freq: Once | INTRAVENOUS | Status: AC
Start: 2023-01-12 — End: 2023-01-12

## 2023-01-12 MED ORDER — SODIUM CHLORIDE 0.9% FLUSH
10.0000 mL | INTRAVENOUS | Status: DC | PRN
  Administered 2023-01-12: 10 mL

## 2023-01-12 MED ORDER — HEPARIN SOD (PORK) LOCK FLUSH 100 UNIT/ML IV SOLN
500.0000 [IU] | Freq: Once | INTRAVENOUS | Status: AC | PRN
  Administered 2023-01-12: 500 [IU]

## 2023-01-12 NOTE — Patient Instructions (Signed)

## 2023-01-17 ENCOUNTER — Encounter: Payer: Self-pay | Admitting: Cardiology

## 2023-01-17 DIAGNOSIS — R109 Unspecified abdominal pain: Secondary | ICD-10-CM

## 2023-01-17 HISTORY — DX: Unspecified abdominal pain: R10.9

## 2023-01-18 ENCOUNTER — Encounter: Payer: Self-pay | Admitting: Cardiology

## 2023-01-18 ENCOUNTER — Ambulatory Visit: Payer: TRICARE For Life (TFL)

## 2023-01-18 ENCOUNTER — Other Ambulatory Visit: Payer: TRICARE For Life (TFL)

## 2023-01-18 ENCOUNTER — Ambulatory Visit: Payer: TRICARE For Life (TFL) | Admitting: Hematology and Oncology

## 2023-01-18 ENCOUNTER — Ambulatory Visit: Payer: Medicare Other | Attending: Cardiology | Admitting: Cardiology

## 2023-01-18 ENCOUNTER — Encounter: Payer: Self-pay | Admitting: Oncology

## 2023-01-18 VITALS — BP 138/88 | HR 102 | Ht 64.0 in | Wt 202.4 lb

## 2023-01-18 DIAGNOSIS — I10 Essential (primary) hypertension: Secondary | ICD-10-CM | POA: Diagnosis not present

## 2023-01-18 DIAGNOSIS — C168 Malignant neoplasm of overlapping sites of stomach: Secondary | ICD-10-CM | POA: Diagnosis not present

## 2023-01-18 DIAGNOSIS — E782 Mixed hyperlipidemia: Secondary | ICD-10-CM | POA: Diagnosis not present

## 2023-01-18 NOTE — Progress Notes (Unsigned)
Cardiology Office Note:    Date:  01/18/2023   ID:  Mackenzie Lewis, DOB 27-Jun-1935, MRN [id number]  PCP:  Philemon Kingdom, MD  Cardiologist:  Gypsy Balsam, MD    Referring MD: Philemon Kingdom, MD   No chief complaint on file.   History of Present Illness:    Mackenzie Lewis is a 87 y.o. female past medical history significant for essential hypertension, chronic kidney failure, dyslipidemia initially was sent to [place] because of epigastric discomfort, she was find to have stomach cancer.  That being aggressively treated by oncology team.  From that point review she seems to be doing fine she lost her partner just few weeks ago she cries in my office.  Denies have any chest pain tightness squeezing pressure burning chest  Past Medical History:  Diagnosis Date   Abdominal pain 01/17/2023   Appendicitis with peritonitis 04/10/2016   Atypical chest pain 09/09/2016   Benign hypertensive renal disease 09/01/2016   Bilateral primary osteoarthritis of knee 01/20/2016   Borderline diabetes 09/09/2016   CKD (chronic kidney disease), stage II 09/01/2016   Cyclic citrullinated peptide (CCP) antibody positive 01/20/2016   Because she has positive CCP, I want to make sure we monitor the patient closely and we encouraged the patient to look for symptoms that include increased hand stiffness, swelling and redness to the MCP joint.  If that happens, she is to call us so that we can schedule her for an ultrasound to look for synovitis.     Essential hypertension 09/09/2016   Gastric cancer (HCC) 09/10/2021   Hyperlipidemia 09/01/2016   Hypertension    Hypothyroidism 09/01/2016   Osteoarthritis of both feet 01/20/2016   Osteoarthritis, hand 01/20/2016   Thyroid disease     Past Surgical History:  Procedure Laterality Date   APPENDECTOMY     LAPAROSCOPIC APPENDECTOMY N/A 04/10/2016   Procedure: APPENDECTOMY LAPAROSCOPIC;  Surgeon: Abigail Miyamoto, MD;  Location: [facility] OR;  Service: General;   Laterality: N/A;    Current Medications: Current Meds  Medication Sig   aspirin 81 MG EC tablet Take 81 mg by mouth daily. Swallow whole.   b complex vitamins capsule Take 1 capsule by mouth daily.   Calcium Carbonate (CALCIUM 600 PO) Take 1 tablet by mouth daily.   Cholecalciferol (VITAMIN D3) 5000 units CAPS Take 1 capsule by mouth daily.   EXFORGE HCT 5-160-12.5 MG TABS Take 1 tablet by mouth daily.   famotidine (PEPCID) 40 MG tablet Take 40 mg by mouth at bedtime.   gabapentin (NEURONTIN) 100 MG capsule Take 1 capsule (100 mg total) by mouth at bedtime. For 2 weeks, then 2 capsules by mouth at bedtime   KRILL OIL PO Take 1 capsule by mouth daily. Unknown strenght   Lactobacillus TABS Take 1 tablet by mouth 2 (two) times daily.   levothyroxine (SYNTHROID, LEVOTHROID) 75 MCG tablet Take 75 mcg by mouth daily before breakfast.    NON FORMULARY Take 1 Dose by mouth See admin instructions. MMW: 3 parts Maalox 2 parts Benadryl 1 part viscious lidicaine  Disp. 6oz  Instructions: 5ml swish and swallow every 3-4 hours   omeprazole (PRILOSEC) 40 MG capsule Take 1 capsule (40 mg total) by mouth 2 (two) times daily.   ondansetron (ZOFRAN-ODT) 4 MG disintegrating tablet Take 1 tablet (4 mg total) by mouth every 8 (eight) hours as needed for nausea or vomiting.   polyethylene glycol powder (GLYCOLAX/MIRALAX) 17 GM/SCOOP powder Take 1 Container by mouth daily.   potassium chloride  SA (KLOR-CON M) 20 MEQ tablet Take 1 tablet (20 mEq total) by mouth 2 (two) times daily.   pravastatin (PRAVACHOL) 20 MG tablet Take 1 tablet (20 mg total) by mouth every evening.   Probiotic Product (PROBIOTIC DAILY PO) Take 1 tablet by mouth daily.   prochlorperazine (COMPAZINE) 10 MG tablet Take 1 tablet (10 mg total) by mouth every 6 (six) hours as needed for nausea or vomiting.   triamcinolone 0.1% oint-Eucerin equivalent cream 1:1 mixture Apply topically 2 (two) times daily.     Allergies:   Doxycycline and  Sulfa antibiotics   Social History   Socioeconomic History   Marital status: Divorced    Spouse name: Not on file   Number of children: 2   Years of education: 12   Highest education level: 12th grade  Occupational History   Occupation: RETIRED  Tobacco Use   Smoking status: Never   Smokeless tobacco: Never  Vaping Use   Vaping status: Never Used  Substance and Sexual Activity   Alcohol use: Not Currently    Comment: occasionally/ 1 time per month   Drug use: Never   Sexual activity: Not Currently  Other Topics Concern   Not on file  Social History Narrative   Not on file   Social Determinants of Health   Financial Resource Strain: Low Risk  (01/04/2022)   Overall Financial Resource Strain (CARDIA)    Difficulty of Paying Living Expenses: Not hard at all  Food Insecurity: No Food Insecurity (01/04/2022)   Hunger Vital Sign    Worried About Running Out of Food in the Last Year: Never true    Ran Out of Food in the Last Year: Never true  Transportation Needs: No Transportation Needs (01/04/2022)   PRAPARE - Administrator, Civil Service (Medical): No    Lack of Transportation (Non-Medical): No  Physical Activity: Inactive (01/04/2022)   Exercise Vital Sign    Days of Exercise per Week: 0 days    Minutes of Exercise per Session: 0 min  Stress: No Stress Concern Present (01/04/2022)   [employer] of Occupational Health - Occupational Stress Questionnaire    Feeling of Stress : Only a little  Social Connections: Socially Integrated (01/04/2022)   Social Connection and Isolation Panel [NHANES]    Frequency of Communication with Friends and Family: More than three times a week    Frequency of Social Gatherings with Friends and Family: More than three times a week    Attends Religious Services: More than 4 times per year    Active Member of [employer] or Organizations: Yes    Attends Engineer, structural: More than 4 times per year    Marital Status:  Living with partner     Family History: The patient's family history includes AAA (abdominal aortic aneurysm) in her brother; Brain cancer (age of onset: 29) in her sister; Breast cancer (age of onset: 75) in her daughter; Breast cancer (age of onset: 49) in her sister; Hypertension in her mother; Leukemia in her cousin; Prostate cancer in her father. ROS:   Please see the history of present illness.    All 14 point review of systems negative except as described per history of present illness  EKGs/Labs/Other Studies Reviewed:         Recent Labs: 01/10/2023: ALT 19; BUN 27; Creatinine 1.35; Hemoglobin 12.2; Platelet Count 162; Potassium 4.1; Sodium 138; TSH 2.296  Recent Lipid Panel    Component Value Date/Time  CHOL 176 06/10/2022 0853   CHOL 181 03/19/2020 1148   TRIG 145 06/10/2022 0853   HDL 77 06/10/2022 0853   HDL 64 03/19/2020 1148   CHOLHDL 2.3 06/10/2022 0853   VLDL 29 06/10/2022 0853   LDLCALC 70 06/10/2022 0853   LDLCALC 92 03/19/2020 1148    Physical Exam:    VS:  BP 138/88   Pulse (!) 102   Ht 5\' 4"  (1.626 m)   Wt 202 lb 6.4 oz (91.8 kg)   SpO2 95%   BMI 34.74 kg/m     Wt Readings from Last 3 Encounters:  01/18/23 202 lb 6.4 oz (91.8 kg)  01/12/23 204 lb 1.9 oz (92.6 kg)  01/10/23 204 lb 3.2 oz (92.6 kg)     GEN:  Well nourished, well developed in no acute distress HEENT: Normal NECK: No JVD; No carotid bruits LYMPHATICS: No lymphadenopathy CARDIAC: RRR, no murmurs, no rubs, no gallops RESPIRATORY:  Clear to auscultation without rales, wheezing or rhonchi  ABDOMEN: Soft, non-tender, non-distended MUSCULOSKELETAL:  No edema; No deformity  SKIN: Warm and dry LOWER EXTREMITIES: no swelling NEUROLOGIC:  Alert and oriented x 3 PSYCHIATRIC:  Normal affect   ASSESSMENT:    1. Essential hypertension   2. Malignant neoplasm of overlapping sites of stomach (HCC)   3. Mixed hyperlipidemia    PLAN:    In order of problems listed  above:  Essential hypertension blood pressure well-controlled continue present management. Malignant stomach cancer aggressively follow-up with oncology doing well from that point review. Mixed dyslipidemia, I did review K PN which show me LDL 70 HDL 77 this is from June 10, 2022 good control continue present management   Medication Adjustments/Labs and Tests Ordered: Current medicines are reviewed at length with the patient today.  Concerns regarding medicines are outlined above.  No orders of the defined types were placed in this encounter.  Medication changes: No orders of the defined types were placed in this encounter.   Signed, Georgeanna Lea, MD, [facility] 01/18/2023 11:08 AM    [facility]

## 2023-01-18 NOTE — Patient Instructions (Signed)

## 2023-01-24 ENCOUNTER — Inpatient Hospital Stay: Payer: Medicare Other

## 2023-01-24 ENCOUNTER — Inpatient Hospital Stay: Payer: Medicare Other | Attending: Hematology and Oncology | Admitting: Hematology and Oncology

## 2023-01-24 ENCOUNTER — Encounter: Payer: Self-pay | Admitting: Hematology and Oncology

## 2023-01-24 ENCOUNTER — Encounter: Payer: Self-pay | Admitting: Oncology

## 2023-01-24 VITALS — BP 137/65 | HR 92 | Temp 98.3°F | Resp 18 | Ht 64.0 in | Wt 206.0 lb

## 2023-01-24 DIAGNOSIS — N142 Nephropathy induced by unspecified drug, medicament or biological substance: Secondary | ICD-10-CM

## 2023-01-24 DIAGNOSIS — N182 Chronic kidney disease, stage 2 (mild): Secondary | ICD-10-CM | POA: Diagnosis not present

## 2023-01-24 DIAGNOSIS — I129 Hypertensive chronic kidney disease with stage 1 through stage 4 chronic kidney disease, or unspecified chronic kidney disease: Secondary | ICD-10-CM | POA: Diagnosis not present

## 2023-01-24 DIAGNOSIS — Z79899 Other long term (current) drug therapy: Secondary | ICD-10-CM | POA: Insufficient documentation

## 2023-01-24 DIAGNOSIS — C168 Malignant neoplasm of overlapping sites of stomach: Secondary | ICD-10-CM

## 2023-01-24 DIAGNOSIS — Z5111 Encounter for antineoplastic chemotherapy: Secondary | ICD-10-CM | POA: Diagnosis not present

## 2023-01-24 DIAGNOSIS — C169 Malignant neoplasm of stomach, unspecified: Secondary | ICD-10-CM | POA: Insufficient documentation

## 2023-01-24 DIAGNOSIS — E876 Hypokalemia: Secondary | ICD-10-CM | POA: Diagnosis not present

## 2023-01-24 LAB — CMP (CANCER CENTER ONLY)
ALT: 20 U/L (ref 0–44)
AST: 23 U/L (ref 15–41)
Albumin: 4 g/dL (ref 3.5–5.0)
Alkaline Phosphatase: 83 U/L (ref 38–126)
Anion gap: 11 (ref 5–15)
BUN: 22 mg/dL (ref 8–23)
CO2: 23 mmol/L (ref 22–32)
Calcium: 9.7 mg/dL (ref 8.9–10.3)
Chloride: 106 mmol/L (ref 98–111)
Creatinine: 1.33 mg/dL — ABNORMAL HIGH (ref 0.44–1.00)
GFR, Estimated: 39 mL/min — ABNORMAL LOW (ref >60)
Glucose, Bld: 157 mg/dL — ABNORMAL HIGH (ref 70–99)
Potassium: 3.8 mmol/L (ref 3.5–5.1)
Sodium: 140 mmol/L (ref 135–145)
Total Bilirubin: 0.6 mg/dL (ref <1.2)
Total Protein: 6.2 g/dL — ABNORMAL LOW (ref 6.5–8.1)

## 2023-01-24 LAB — CBC WITH DIFFERENTIAL (CANCER CENTER ONLY)
Abs Immature Granulocytes: 0.01 10*3/uL (ref 0.00–0.07)
Basophils Absolute: 0 10*3/uL (ref 0.0–0.1)
Basophils Relative: 1 %
Eosinophils Absolute: 0.4 10*3/uL (ref 0.0–0.5)
Eosinophils Relative: 6 %
HCT: 33.9 % — ABNORMAL LOW (ref 36.0–46.0)
Hemoglobin: 11.9 g/dL — ABNORMAL LOW (ref 12.0–15.0)
Immature Granulocytes: 0 %
Lymphocytes Relative: 28 %
Lymphs Abs: 1.7 10*3/uL (ref 0.7–4.0)
MCH: 31.2 pg (ref 26.0–34.0)
MCHC: 35.1 g/dL (ref 30.0–36.0)
MCV: 89 fL (ref 80.0–100.0)
Monocytes Absolute: 0.6 10*3/uL (ref 0.1–1.0)
Monocytes Relative: 9 %
Neutro Abs: 3.5 10*3/uL (ref 1.7–7.7)
Neutrophils Relative %: 56 %
Platelet Count: 153 10*3/uL (ref 150–400)
RBC: 3.81 MIL/uL — ABNORMAL LOW (ref 3.87–5.11)
RDW: 15.4 % (ref 11.5–15.5)
WBC Count: 6.2 10*3/uL (ref 4.0–10.5)
nRBC: 0 % (ref 0.0–0.2)
nRBC: 0 /100{WBCs}

## 2023-01-24 LAB — TSH: TSH: 1.983 u[IU]/mL (ref 0.350–4.500)

## 2023-01-24 MED ORDER — SODIUM CHLORIDE 0.9 % IV SOLN
Freq: Once | INTRAVENOUS | Status: AC
Start: 2023-01-24 — End: 2023-01-24

## 2023-01-24 MED ORDER — DEXAMETHASONE SODIUM PHOSPHATE 10 MG/ML IJ SOLN
10.0000 mg | Freq: Once | INTRAMUSCULAR | Status: AC
Start: 2023-01-24 — End: 2023-01-24
  Administered 2023-01-24: 10 mg via INTRAVENOUS
  Filled 2023-01-24: qty 1

## 2023-01-24 MED ORDER — PALONOSETRON HCL INJECTION 0.25 MG/5ML
0.2500 mg | Freq: Once | INTRAVENOUS | Status: AC
  Administered 2023-01-24: 0.25 mg via INTRAVENOUS
  Filled 2023-01-24: qty 5

## 2023-01-24 MED ORDER — DEXTROSE 5 % IV SOLN
Freq: Once | INTRAVENOUS | Status: DC
Start: 2023-01-24 — End: 2023-01-24

## 2023-01-24 MED ORDER — SODIUM CHLORIDE 0.9 % IV SOLN
1960.0000 mg/m2 | INTRAVENOUS | Status: DC
  Administered 2023-01-24: 3850 mg via INTRAVENOUS
  Filled 2023-01-24: qty 77

## 2023-01-24 MED ORDER — SODIUM CHLORIDE 0.9 % IV SOLN: 10.0000 mg | Freq: Once | INTRAVENOUS | Status: DC

## 2023-01-24 MED ORDER — FLUOROURACIL CHEMO INJECTION 2.5 GM/50ML
320.0000 mg/m2 | Freq: Once | INTRAVENOUS | Status: AC
  Administered 2023-01-24: 650 mg via INTRAVENOUS
  Filled 2023-01-24: qty 13

## 2023-01-24 MED ORDER — LORAZEPAM 0.5 MG PO TABS: 0.5000 mg | ORAL_TABLET | Freq: Three times a day (TID) | ORAL | 0 refills | Status: DC

## 2023-01-24 MED ORDER — SODIUM CHLORIDE 0.9 % IV SOLN
240.0000 mg | Freq: Once | INTRAVENOUS | Status: AC
  Administered 2023-01-24: 240 mg via INTRAVENOUS
  Filled 2023-01-24: qty 24

## 2023-01-24 MED ORDER — LEUCOVORIN CALCIUM INJECTION 350 MG
320.0000 mg/m2 | Freq: Once | INTRAVENOUS | Status: AC
  Administered 2023-01-24: 630 mg via INTRAVENOUS
  Filled 2023-01-24: qty 31.5

## 2023-01-24 NOTE — Patient Instructions (Signed)
Fluorouracil Injection What is this medication? FLUOROURACIL (flure oh YOOR a sil) treats some types of cancer. It works by slowing down the growth of cancer cells. This medicine may be used for other purposes; ask your health care provider or pharmacist if you have questions. COMMON BRAND NAME(S): Adrucil What should I tell my care team before I take this medication? They need to know if you have any of these conditions: Blood disorders Dihydropyrimidine dehydrogenase (DPD) deficiency Infection, such as chickenpox, cold sores, herpes Kidney disease Liver disease Poor nutrition Recent or ongoing radiation therapy An unusual or allergic reaction to fluorouracil, other medications, foods, dyes, or preservatives If you or your partner are pregnant or trying to get pregnant Breast-feeding How should I use this medication? This medication is injected into a vein. It is administered by your care team in a hospital or clinic setting. Talk to your care team about the use of this medication in children. Special care may be needed. Overdosage: If you think you have taken too much of this medicine contact a poison control center or emergency room at once. NOTE: This medicine is only for you. Do not share this medicine with others. What if I miss a dose? Keep appointments for follow-up doses. It is important not to miss your dose. Call your care team if you are unable to keep an appointment. What may interact with this medication? Do not take this medication with any of the following: Live virus vaccines This medication may also interact with the following: Medications that treat or prevent blood clots, such as warfarin, enoxaparin, dalteparin This list may not describe all possible interactions. Give your health care provider a list of all the medicines, herbs, non-prescription drugs, or dietary supplements you use. Also tell them if you smoke, drink alcohol, or use illegal drugs. Some items may  interact with your medicine. What should I watch for while using this medication? Your condition will be monitored carefully while you are receiving this medication. This medication may make you feel generally unwell. This is not uncommon as chemotherapy can affect healthy cells as well as cancer cells. Report any side effects. Continue your course of treatment even though you feel ill unless your care team tells you to stop. In some cases, you may be given additional medications to help with side effects. Follow all directions for their use. This medication may increase your risk of getting an infection. Call your care team for advice if you get a fever, chills, sore throat, or other symptoms of a cold or flu. Do not treat yourself. Try to avoid being around people who are sick. This medication may increase your risk to bruise or bleed. Call your care team if you notice any unusual bleeding. Be careful brushing or flossing your teeth or using a toothpick because you may get an infection or bleed more easily. If you have any dental work done, tell your dentist you are receiving this medication. Avoid taking medications that contain aspirin, acetaminophen, ibuprofen, naproxen, or ketoprofen unless instructed by your care team. These medications may hide a fever. Do not treat diarrhea with over the counter products. Contact your care team if you have diarrhea that lasts more than 2 days or if it is severe and watery. This medication can make you more sensitive to the sun. Keep out of the sun. If you cannot avoid being in the sun, wear protective clothing and sunscreen. Do not use sun lamps, tanning beds, or tanning booths. Talk to   your care team if you or your partner wish to become pregnant or think you might be pregnant. This medication can cause serious birth defects if taken during pregnancy and for 3 months after the last dose. A reliable form of contraception is recommended while taking this  medication and for 3 months after the last dose. Talk to your care team about effective forms of contraception. Do not father a child while taking this medication and for 3 months after the last dose. Use a condom while having sex during this time period. Do not breastfeed while taking this medication. This medication may cause infertility. Talk to your care team if you are concerned about your fertility. What side effects may I notice from receiving this medication? Side effects that you should report to your care team as soon as possible: Allergic reactions--skin rash, itching, hives, swelling of the face, lips, tongue, or throat Heart attack--pain or tightness in the chest, shoulders, arms, or jaw, nausea, shortness of breath, cold or clammy skin, feeling faint or lightheaded Heart failure--shortness of breath, swelling of the ankles, feet, or hands, sudden weight gain, unusual weakness or fatigue Heart rhythm changes--fast or irregular heartbeat, dizziness, feeling faint or lightheaded, chest pain, trouble breathing High ammonia level--unusual weakness or fatigue, confusion, loss of appetite, nausea, vomiting, seizures Infection--fever, chills, cough, sore throat, wounds that don't heal, pain or trouble when passing urine, general feeling of discomfort or being unwell Low red blood cell level--unusual weakness or fatigue, dizziness, headache, trouble breathing Pain, tingling, or numbness in the hands or feet, muscle weakness, change in vision, confusion or trouble speaking, loss of balance or coordination, trouble walking, seizures Redness, swelling, and blistering of the skin over hands and feet Severe or prolonged diarrhea Unusual bruising or bleeding Side effects that usually do not require medical attention (report to your care team if they continue or are bothersome): Dry skin Headache Increased tears Nausea Pain, redness, or swelling with sores inside the mouth or throat Sensitivity  to light Vomiting This list may not describe all possible side effects. Call your doctor for medical advice about side effects. You may report side effects to FDA at 1-800-FDA-1088. Where should I keep my medication? This medication is given in a hospital or clinic. It will not be stored at home. NOTE: This sheet is a summary. It may not cover all possible information. If you have questions about this medicine, talk to your doctor, pharmacist, or health care provider.  2024 [employer] Standard (2021-06-09 00:00:00) Leucovorin Injection What is this medication? LEUCOVORIN (loo koe VOR in) prevents side effects from certain medications, such as methotrexate. It works by increasing folate levels. This helps protect healthy cells in your body. It may also be used to treat anemia caused by low levels of folate. It can also be used with fluorouracil, a type of chemotherapy, to treat colorectal cancer. It works by increasing the effects of fluorouracil in the body. This medicine may be used for other purposes; ask your health care provider or pharmacist if you have questions. What should I tell my care team before I take this medication? They need to know if you have any of these conditions: Anemia from low levels of vitamin B12 in the blood An unusual or allergic reaction to leucovorin, folic acid, other medications, foods, dyes, or preservatives Pregnant or trying to get pregnant Breastfeeding How should I use this medication? This medication is injected into a vein or a muscle. It is given by your care team   in a hospital or clinic setting. Talk to your care team about the use of this medication in children. Special care may be needed. Overdosage: If you think you have taken too much of this medicine contact a poison control center or emergency room at once. NOTE: This medicine is only for you. Do not share this medicine with others. What if I miss a dose? Keep appointments for follow-up doses.  It is important not to miss your dose. Call your care team if you are unable to keep an appointment. What may interact with this medication? Capecitabine Fluorouracil Phenobarbital Phenytoin Primidone Trimethoprim;sulfamethoxazole This list may not describe all possible interactions. Give your health care provider a list of all the medicines, herbs, non-prescription drugs, or dietary supplements you use. Also tell them if you smoke, drink alcohol, or use illegal drugs. Some items may interact with your medicine. What should I watch for while using this medication? Your condition will be monitored carefully while you are receiving this medication. This medication may increase the side effects of 5-fluorouracil. Tell your care team if you have diarrhea or mouth sores that do not get better or that get worse. What side effects may I notice from receiving this medication? Side effects that you should report to your care team as soon as possible: Allergic reactions--skin rash, itching, hives, swelling of the face, lips, tongue, or throat This list may not describe all possible side effects. Call your doctor for medical advice about side effects. You may report side effects to FDA at 1-800-FDA-1088. Where should I keep my medication? This medication is given in a hospital or clinic. It will not be stored at home. NOTE: This sheet is a summary. It may not cover all possible information. If you have questions about this medicine, talk to your doctor, pharmacist, or health care provider.  2024 [employer] Standard (2021-07-07 00:00:00) Nivolumab Injection What is this medication? NIVOLUMAB (nye VOL ue mab) treats some types of cancer. It works by helping your immune system slow or stop the spread of cancer cells. It is a monoclonal antibody. This medicine may be used for other purposes; ask your health care provider or pharmacist if you have questions. COMMON BRAND NAME(S): Opdivo What should I  tell my care team before I take this medication? They need to know if you have any of these conditions: Allogeneic stem cell transplant (uses someone else's stem cells) Autoimmune diseases, such as Crohn disease, ulcerative colitis, lupus History of chest radiation Nervous system problems, such as Guillain-Barre syndrome or myasthenia gravis Organ transplant An unusual or allergic reaction to nivolumab, other medications, foods, dyes, or preservatives Pregnant or trying to get pregnant Breast-feeding How should I use this medication? This medication is infused into a vein. It is given in a hospital or clinic setting. A special MedGuide will be given to you before each treatment. Be sure to read this information carefully each time. Talk to your care team about the use of this medication in children. While it may be prescribed for children as young as 12 years for selected conditions, precautions do apply. Overdosage: If you think you have taken too much of this medicine contact a poison control center or emergency room at once. NOTE: This medicine is only for you. Do not share this medicine with others. What if I miss a dose? Keep appointments for follow-up doses. It is important not to miss your dose. Call your care team if you are unable to keep an appointment. What may   interact with this medication? Interactions have not been studied. This list may not describe all possible interactions. Give your health care provider a list of all the medicines, herbs, non-prescription drugs, or dietary supplements you use. Also tell them if you smoke, drink alcohol, or use illegal drugs. Some items may interact with your medicine. What should I watch for while using this medication? Your condition will be monitored carefully while you are receiving this medication. You may need blood work while taking this medication. This medication may cause serious skin reactions. They can happen weeks to months after  starting the medication. Contact your care team right away if you notice fevers or flu-like symptoms with a rash. The rash may be red or purple and then turn into blisters or peeling of the skin. You may also notice a red rash with swelling of the face, lips, or lymph nodes in your neck or under your arms. Tell your care team right away if you have any change in your eyesight. Talk to your care team if you are pregnant or think you might be pregnant. A negative pregnancy test is required before starting this medication. A reliable form of contraception is recommended while taking this medication and for 5 months after the last dose. Talk to your care team about effective forms of contraception. Do not breast-feed while taking this medication and for 5 months after the last dose. What side effects may I notice from receiving this medication? Side effects that you should report to your care team as soon as possible: Allergic reactions--skin rash, itching, hives, swelling of the face, lips, tongue, or throat Dry cough, shortness of breath or trouble breathing Eye pain, redness, irritation, or discharge with blurry or decreased vision Heart muscle inflammation--unusual weakness or fatigue, shortness of breath, chest pain, fast or irregular heartbeat, dizziness, swelling of the ankles, feet, or hands Hormone gland problems--headache, sensitivity to light, unusual weakness or fatigue, dizziness, fast or irregular heartbeat, increased sensitivity to cold or heat, excessive sweating, constipation, hair loss, increased thirst or amount of urine, tremors or shaking, irritability Infusion reactions--chest pain, shortness of breath or trouble breathing, feeling faint or lightheaded Kidney injury (glomerulonephritis)--decrease in the amount of urine, red or dark brown urine, foamy or bubbly urine, swelling of the ankles, hands, or feet Liver injury--right upper belly pain, loss of appetite, nausea, light-colored  stool, dark yellow or brown urine, yellowing skin or eyes, unusual weakness or fatigue Pain, tingling, or numbness in the hands or feet, muscle weakness, change in vision, confusion or trouble speaking, loss of balance or coordination, trouble walking, seizures Rash, fever, and swollen lymph nodes Redness, blistering, peeling, or loosening of the skin, including inside the mouth Sudden or severe stomach pain, bloody diarrhea, fever, nausea, vomiting Side effects that usually do not require medical attention (report these to your care team if they continue or are bothersome): Bone, joint, or muscle pain Diarrhea Fatigue Loss of appetite Nausea Skin rash This list may not describe all possible side effects. Call your doctor for medical advice about side effects. You may report side effects to FDA at 1-800-FDA-1088. Where should I keep my medication? This medication is given in a hospital or clinic. It will not be stored at home. NOTE: This sheet is a summary. It may not cover all possible information. If you have questions about this medicine, talk to your doctor, pharmacist, or health care provider.  2024 [employer] Standard (2021-06-01 00:00:00)  

## 2023-01-24 NOTE — Progress Notes (Signed)
[facility]  [address] [place],  [place]  [address] [phone]  Clinic Day:  01/24/2023  Referring physician: Philemon Kingdom, MD  ASSESSMENT & PLAN:   Assessment & Plan: Gastric cancer (HCC) Stage IVB (T4 N0 M1) poorly differentiated adenocarcinoma of the stomach with signet ring features and ulceration, metastatic to the omentum.  Stain for HER2 was negative.  PET scan revealed omental involvement, but no distant metastasis was seen. She is receiving palliative chemotherapy with FOLFOX/nivolumab (5-fluorouracil/leucovorin/oxaliplatin/nivolumab), which now consists of 5-fluorouracil/leucovorin/nivolumab.  Oxaliplatin was discontinued after 11 cycles.  PET in February 2024 revealed resolution of metabolic activity associated with the stomach.  Decrease in size of omental nodularity. No associated metabolic activity.   EGD in March revealed residual disease.  CT chest, abdomen and pelvis in July revealed stable disease with persistent mild gastric wall thickening and subtle adjacent omental nodularity.  CT chest, abdomen pelvis and October revealed persistent wall thickening along the distal stomach with the adjacent stranding and soft tissue thickening, no additional areas of peritoneal nodularity or developing lymph node enlargement, no liver lesion and fatty liver infiltration. Once again changes interstitial lung disease with fibrosis, bronchiectasis were seen, as well as stable slight asymmetric thickening along the gallbladder, but the gallbladder is under-distended.  Her disease remains stable with treatment but still persists, so we recommended we continue the maintenance treatment and repeat imaging in 3 months.   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  She receives extra IV fluids with her infusion to prevent recurrent acute kidney injury.  Her kidney  function is stable.  She will proceed with 29th of 5-fluorouracil/leucovorin/nivolumab cycle today.  The patient initially wanted to skip this week of Thanksgiving, but then decided to proceed.  I will plan to see her back in 2 weeks with a CBC, comprehensive metabolic panel, and TSH prior to a 30th cycle.  Drug-induced interstitial nephritis History immune mediated nephritis from immunotherapy in February, which resolved with prednisone.  She has had chronic kidney disease since that episode.  Her creatinine is stable.  We have continued to give her IV fluids the week of her chemotherapy.    Hypokalemia Her potassium remains normal on potassium chloride 20 mEq twice daily, which we will continue.    The patient understands the plans discussed today and is in agreement with them.  She knows to contact our office if she develops concerns prior to her next appointment.   I provided 30 minutes of face-to-face time during this encounter and > 50% was spent counseling as documented under my assessment and plan.    Adah Perl, PA-C  [facility] [address] [place] [address] Dept: [phone] Dept Fax: [phone]   No orders of the defined types were placed in this encounter.     CHIEF COMPLAINT:  CC: Stage IVB gastric cancer  Current Treatment:  5-fluorouracil/leucovorin/nivoulmab  HISTORY OF PRESENT ILLNESS:   Oncology History  Gastric cancer (HCC)  09/10/2021 Initial Diagnosis   Gastric cancer (HCC)   09/10/2021 Cancer Staging   Staging form: Stomach, AJCC 8th Edition - Clinical stage from 09/10/2021: Stage IVB (cT4b, cN0, cM1) - Signed by Dellia Beckwith, MD on 09/10/2021 Histopathologic type: Adenocarcinoma, NOS Stage prefix: Initial diagnosis Total positive nodes: 0 Histologic grade (G): G3 Histologic grading system: 3  grade system Sites of metastasis: Peritoneal  surface Diagnostic confirmation: Positive histology PLUS positive immunophenotyping and/or positive genetic studies Specimen type: Endoscopy with Biopsy Staged by: Managing physician Carcinoembryonic antigen (CEA) (ng/mL): 2.8 Carbohydrate antigen 19-9 (CA 19-9) (U/mL): 4.9 HER2 status: Unknown Microsatellite instability (MSI): Unknown Tumor location in stomach: Other Clinical staging modalities: Biopsy, Endoscopy Stage used in treatment planning: Yes National guidelines used in treatment planning: Yes Type of national guideline used in treatment planning: NCCN   09/28/2021 - 10/14/2021 Chemotherapy   Patient is on Treatment Plan : GASTROESOPHAGEAL FOLFOX + Nivolumab q14d     09/28/2021 -  Chemotherapy   Patient is on Treatment Plan : GASTROESOPHAGEAL FOLFOX + Nivolumab q14d     12/09/2021 Genetic Testing   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  Report date is 12/09/2021.   The Multi-Cancer + RNA Panel offered by [facility] includes sequencing and/or deletion/duplication analysis of the following 84 genes:  AIP*, ALK, APC*, ATM*, AXIN2*, BAP1*, BARD1*, BLM*, BMPR1A*, BRCA1*, BRCA2*, BRIP1*, CASR, CDC73*, CDH1*, CDK4, CDKN1B*, CDKN1C*, CDKN2A, CEBPA, CHEK2*, CTNNA1*, DICER1*, DIS3L2*, EGFR, EPCAM, FH*, FLCN*, GATA2*, GPC3, GREM1, HOXB13, HRAS, KIT, MAX*, MEN1*, MET, MITF, MLH1*, MSH2*, MSH3*, MSH6*, MUTYH*, NBN*, NF1*, NF2*, NTHL1*, PALB2*, PDGFRA, PHOX2B, PMS2*, POLD1*, POLE*, POT1*, PRKAR1A*, PTCH1*, PTEN*, RAD50*, RAD51C*, RAD51D*, RB1*, RECQL4, RET, RUNX1*, SDHA*, SDHAF2*, SDHB*, SDHC*, SDHD*, SMAD4*, SMARCA4*, SMARCB1*, SMARCE1*, STK11*, SUFU*, TERC, TERT, TMEM127*, Tp53*, TSC1*, TSC2*, VHL*, WRN*, and WT1.  RNA analysis is performed for * genes.       INTERVAL HISTORY:  Mackenzie Lewis is here today for repeat clinical assessment prior to 29th cycle of chemo-immunotherapy, which now consists 5-fluorouracil/leucovorin/nivolumab every 2 weeks.  She  continues to tolerate treatment fairly well. She reports stable neuropathy of the hands and feet.  She does have difficulty with fine motor skills.  She tried increasing her gabapentin to 2 capsules daily, without much improvement, so she is only taking 1 at bedtime.  She denies fevers or chills. She denies pain. Her appetite is good. Her weight has increased 4 pounds over last 2 weeks .  She continues to grieve the loss of her significant other appropriately.  She requests a refill of the medication Dr. Gilman Buttner prescribed previously to help her sleep.  She is only using this occasionally.  She does not recall the name.  Review of her record shows this was lorazepam 0.5 mg.  REVIEW OF SYSTEMS:  Review of Systems  Constitutional:  Negative for appetite change, chills, diaphoresis, fatigue, fever and unexpected weight change.  HENT:   Positive for nosebleeds (occasional mild bleeding). Negative for lump/mass, mouth sores and sore throat.   Respiratory:  Negative for cough and shortness of breath.   Cardiovascular:  Negative for chest pain and leg swelling.  Gastrointestinal:  Negative for abdominal pain, blood in stool, constipation, diarrhea, nausea and vomiting.  Genitourinary:  Negative for difficulty urinating, dysuria, frequency and hematuria.   Musculoskeletal:  Negative for arthralgias, back pain, gait problem and myalgias.  Skin:  Negative for itching and rash.  Neurological:  Positive for numbness (neuropathy bilateral hands and feet). Negative for dizziness, extremity weakness, gait problem, headaches and light-headedness.  Hematological:  Negative for adenopathy. Does not bruise/bleed easily.  Psychiatric/Behavioral:  Negative for depression and sleep disturbance. The patient is not nervous/anxious.      VITALS:  Blood pressure 137/65, pulse 92, temperature 98.3 F (36.8 C), temperature source Oral, resp. rate 18, height 5\' 4"  (1.626 m), weight 206 lb (93.4  kg), SpO2 97%.  Wt Readings  from Last 3 Encounters:  01/24/23 206 lb (93.4 kg)  01/18/23 202 lb 6.4 oz (91.8 kg)  01/12/23 204 lb 1.9 oz (92.6 kg)    Body mass index is 35.36 kg/m.  Performance status (ECOG): 1 - Symptomatic but completely ambulatory  PHYSICAL EXAM:  Physical Exam Vitals and nursing note reviewed.  Constitutional:      General: She is not in acute distress.    Appearance: Normal appearance.  HENT:     Head: Normocephalic and atraumatic.     Mouth/Throat:     Mouth: Mucous membranes are moist.     Pharynx: Oropharynx is clear. No oropharyngeal exudate or posterior oropharyngeal erythema.  Eyes:     General: No scleral icterus.    Extraocular Movements: Extraocular movements intact.     Conjunctiva/sclera: Conjunctivae normal.     Pupils: Pupils are equal, round, and reactive to light.  Cardiovascular:     Rate and Rhythm: Normal rate and regular rhythm.     Heart sounds: Normal heart sounds. No murmur heard.    No friction rub. No gallop.  Pulmonary:     Effort: Pulmonary effort is normal.     Breath sounds: Normal breath sounds. No wheezing, rhonchi or rales.  Abdominal:     General: There is no distension.     Palpations: Abdomen is soft. There is no hepatomegaly, splenomegaly or mass.     Tenderness: There is no abdominal tenderness.  Musculoskeletal:        General: Normal range of motion.     Cervical back: Normal range of motion and neck supple. No tenderness.     Right lower leg: No edema.     Left lower leg: No edema.  Lymphadenopathy:     Cervical: No cervical adenopathy.     Upper Body:     Right upper body: No supraclavicular or axillary adenopathy.     Left upper body: No supraclavicular or axillary adenopathy.     Lower Body: No right inguinal adenopathy. No left inguinal adenopathy.  Skin:    General: Skin is warm and dry.     Coloration: Skin is not jaundiced.     Findings: No rash.  Neurological:     Mental Status: She is alert and oriented to person, place,  and time.     Cranial Nerves: No cranial nerve deficit.  Psychiatric:        Mood and Affect: Mood normal.        Behavior: Behavior normal.        Thought Content: Thought content normal.    LABS:      Latest Ref Rng & Units 01/24/2023    8:11 AM 01/10/2023    8:44 AM 12/27/2022    9:05 AM  CBC  WBC 4.0 - 10.5 K/uL 6.2  6.2  7.7   Hemoglobin 12.0 - 15.0 g/dL 16.1  09.6  04.5   Hematocrit 36.0 - 46.0 % 33.9  34.9  35.5   Platelets 150 - 400 K/uL 153  162  156       Latest Ref Rng & Units 01/24/2023    8:11 AM 01/10/2023    8:44 AM 12/27/2022    9:05 AM  CMP  Glucose 70 - 99 mg/dL 409  811  914   BUN 8 - 23 mg/dL 22  27  23    Creatinine 0.44 - 1.00 mg/dL 7.82  9.56  2.13   Sodium 135 -  145 mmol/L 140  138  140   Potassium 3.5 - 5.1 mmol/L 3.8  4.1  4.0   Chloride 98 - 111 mmol/L 106  105  107   CO2 22 - 32 mmol/L 23  22  20    Calcium 8.9 - 10.3 mg/dL 9.7  9.8  9.5   Total Protein 6.5 - 8.1 g/dL 6.2  6.3  6.5   Total Bilirubin <1.2 mg/dL 0.6  0.5  0.5   Alkaline Phos 38 - 126 U/L 83  88  83   AST 15 - 41 U/L 23  21  22    ALT 0 - 44 U/L 20  19  19       Lab Results  Component Value Date   CEA1 2.9 09/10/2021   /  CEA  Date Value Ref Range Status  09/10/2021 2.9 0.0 - 4.7 ng/mL Final    Comment:    (NOTE)                             Nonsmokers          <3.9                             Smokers             <5.6 [employer] Electrochemiluminescence Immunoassay (ECLIA) Values obtained with different assay methods or kits cannot be used interchangeably.  Results cannot be interpreted as absolute evidence of the presence or absence of malignant disease. Performed At: [facility] [address] [place], [place] [id number] Jolene Schimke MD QI:[id number]     STUDIES:  No results found.    HISTORY:   Past Medical History:  Diagnosis Date   Abdominal pain 01/17/2023   Appendicitis with peritonitis 04/10/2016   Atypical chest pain 09/09/2016    Benign hypertensive renal disease 09/01/2016   Bilateral primary osteoarthritis of knee 01/20/2016   Borderline diabetes 09/09/2016   CKD (chronic kidney disease), stage II 09/01/2016   Cyclic citrullinated peptide (CCP) antibody positive 01/20/2016   Because she has positive CCP, I want to make sure we monitor the patient closely and we encouraged the patient to look for symptoms that include increased hand stiffness, swelling and redness to the MCP joint.  If that happens, she is to call us so that we can schedule her for an ultrasound to look for synovitis.     Essential hypertension 09/09/2016   Gastric cancer (HCC) 09/10/2021   Hyperlipidemia 09/01/2016   Hypertension    Hypothyroidism 09/01/2016   Osteoarthritis of both feet 01/20/2016   Osteoarthritis, hand 01/20/2016   Thyroid disease     Past Surgical History:  Procedure Laterality Date   APPENDECTOMY     LAPAROSCOPIC APPENDECTOMY N/A 04/10/2016   Procedure: APPENDECTOMY LAPAROSCOPIC;  Surgeon: Abigail Miyamoto, MD;  Location: [facility] OR;  Service: General;  Laterality: N/A;    Family History  Problem Relation Age of Onset   Hypertension Mother    Prostate cancer Father        metastatic; d. 27   Brain cancer Sister 77   Breast cancer Sister 109   AAA (abdominal aortic aneurysm) Brother    Leukemia Cousin        x2 maternal female cousins; d. before 15   Breast cancer Daughter 51       DCIS    Social History:  reports that she has  never smoked. She has never used smokeless tobacco. She reports that she does not currently use alcohol. She reports that she does not use drugs.The patient is accompanied by her daughter today.  Allergies:  Allergies  Allergen Reactions   Doxycycline Rash   Sulfa Antibiotics Rash and Other (See Comments)    Other reaction(s): Other (See Comments)  "Made me feel weird"    Current Medications: Current Outpatient Medications  Medication Sig Dispense Refill   LORazepam (ATIVAN) 0.5 MG  tablet Take 1 tablet (0.5 mg total) by mouth every 8 (eight) hours. 30 tablet 0   aspirin 81 MG EC tablet Take 81 mg by mouth daily. Swallow whole.     b complex vitamins capsule Take 1 capsule by mouth daily.     Calcium Carbonate (CALCIUM 600 PO) Take 1 tablet by mouth daily.     Cholecalciferol (VITAMIN D3) 5000 units CAPS Take 1 capsule by mouth daily.     EXFORGE HCT 5-160-12.5 MG TABS Take 1 tablet by mouth daily.     famotidine (PEPCID) 40 MG tablet Take 40 mg by mouth at bedtime.     gabapentin (NEURONTIN) 100 MG capsule Take 1 capsule (100 mg total) by mouth at bedtime. For 2 weeks, then 2 capsules by mouth at bedtime (Patient taking differently: Take 100 mg by mouth at bedtime. For 2 weeks, then 2 capsules by mouth at bedtime Currently taking one at bedtime) 50 capsule 5   KRILL OIL PO Take 1 capsule by mouth daily. Unknown strenght     Lactobacillus TABS Take 1 tablet by mouth 2 (two) times daily.     levothyroxine (SYNTHROID, LEVOTHROID) 75 MCG tablet Take 75 mcg by mouth daily before breakfast.      NON FORMULARY Take 1 Dose by mouth See admin instructions. MMW: 3 parts Maalox 2 parts Benadryl 1 part viscious lidicaine  Disp. 6oz  Instructions: 5ml swish and swallow every 3-4 hours     omeprazole (PRILOSEC) 40 MG capsule Take 1 capsule (40 mg total) by mouth 2 (two) times daily. 60 capsule 5   ondansetron (ZOFRAN-ODT) 4 MG disintegrating tablet Take 1 tablet (4 mg total) by mouth every 8 (eight) hours as needed for nausea or vomiting. 90 tablet 0   polyethylene glycol powder (GLYCOLAX/MIRALAX) 17 GM/SCOOP powder Take 1 Container by mouth daily.     potassium chloride SA (KLOR-CON M) 20 MEQ tablet Take 1 tablet (20 mEq total) by mouth 2 (two) times daily. 180 tablet 1   pravastatin (PRAVACHOL) 20 MG tablet Take 1 tablet (20 mg total) by mouth every evening. 90 tablet 3   Probiotic Product (PROBIOTIC DAILY PO) Take 1 tablet by mouth daily.     prochlorperazine (COMPAZINE) 10 MG  tablet Take 1 tablet (10 mg total) by mouth every 6 (six) hours as needed for nausea or vomiting. 90 tablet 3   triamcinolone 0.1% oint-Eucerin equivalent cream 1:1 mixture Apply topically 2 (two) times daily. (Patient not taking: Reported on 01/24/2023) 160 g 0   No current facility-administered medications for this visit.

## 2023-01-24 NOTE — Assessment & Plan Note (Signed)
 Her potassium remains normal on potassium chloride 20 mEq twice daily, which we will continue.

## 2023-01-24 NOTE — Assessment & Plan Note (Signed)
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  Stain for HER2 was negative.  PET scan revealed omental involvement, but no distant metastasis was seen. She is receiving palliative chemotherapy with FOLFOX/nivolumab (5-fluorouracil/leucovorin/oxaliplatin/nivolumab), which now consists of 5-fluorouracil/leucovorin/nivolumab.  Oxaliplatin was discontinued after 11 cycles.  PET in February 2024 revealed resolution of metabolic activity associated with the stomach.  Decrease in size of omental nodularity. No associated metabolic activity.   EGD in March revealed residual disease.  CT chest, abdomen and pelvis in July revealed stable disease with persistent mild gastric wall thickening and subtle adjacent omental nodularity.  CT chest, abdomen pelvis and October revealed persistent wall thickening along the distal stomach with the adjacent stranding and soft tissue thickening, no additional areas of peritoneal nodularity or developing lymph node enlargement, no liver lesion and fatty liver infiltration. Once again changes interstitial lung disease with fibrosis, bronchiectasis were seen, as well as stable slight asymmetric thickening along the gallbladder, but the gallbladder is under-distended.  Her disease remains stable with treatment but still persists, so we recommended we continue the maintenance treatment and repeat imaging in 3 months.   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  She receives extra IV fluids with her infusion to prevent recurrent acute kidney injury.  Her kidney function is stable.  She will proceed with 29th of 5-fluorouracil/leucovorin/nivolumab cycle today.  The patient initially wanted to skip this week of Thanksgiving, but then decided to proceed.  I will plan to see her back in 2  weeks with a CBC, comprehensive metabolic panel, and TSH prior to a 30th cycle.

## 2023-01-24 NOTE — Assessment & Plan Note (Signed)
 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  She has had chronic kidney disease since that episode.  Her creatinine is stable.  We have continued to give her IV fluids the week of her chemotherapy.

## 2023-01-25 LAB — T4: T4, Total: 10.9 ug/dL (ref 4.5–12.0)

## 2023-01-26 ENCOUNTER — Inpatient Hospital Stay: Payer: Medicare Other

## 2023-01-26 VITALS — BP 124/55 | HR 87 | Temp 97.5°F | Resp 18

## 2023-01-26 DIAGNOSIS — Z5111 Encounter for antineoplastic chemotherapy: Secondary | ICD-10-CM | POA: Diagnosis not present

## 2023-01-26 DIAGNOSIS — C168 Malignant neoplasm of overlapping sites of stomach: Secondary | ICD-10-CM

## 2023-01-26 DIAGNOSIS — I129 Hypertensive chronic kidney disease with stage 1 through stage 4 chronic kidney disease, or unspecified chronic kidney disease: Secondary | ICD-10-CM | POA: Diagnosis not present

## 2023-01-26 DIAGNOSIS — Z79899 Other long term (current) drug therapy: Secondary | ICD-10-CM | POA: Diagnosis not present

## 2023-01-26 DIAGNOSIS — C169 Malignant neoplasm of stomach, unspecified: Secondary | ICD-10-CM | POA: Diagnosis not present

## 2023-01-26 DIAGNOSIS — E876 Hypokalemia: Secondary | ICD-10-CM | POA: Diagnosis not present

## 2023-01-26 DIAGNOSIS — N182 Chronic kidney disease, stage 2 (mild): Secondary | ICD-10-CM | POA: Diagnosis not present

## 2023-01-26 MED ORDER — SODIUM CHLORIDE 0.9 % IV SOLN
Freq: Once | INTRAVENOUS | Status: AC
Start: 2023-01-26 — End: 2023-01-26

## 2023-01-26 MED ORDER — SODIUM CHLORIDE 0.9% FLUSH
10.0000 mL | INTRAVENOUS | Status: DC | PRN
  Administered 2023-01-26: 10 mL

## 2023-01-26 MED ORDER — HEPARIN SOD (PORK) LOCK FLUSH 100 UNIT/ML IV SOLN
500.0000 [IU] | Freq: Once | INTRAVENOUS | Status: AC | PRN
  Administered 2023-01-26: 500 [IU]

## 2023-01-26 NOTE — Patient Instructions (Signed)

## 2023-02-09 ENCOUNTER — Other Ambulatory Visit: Payer: Self-pay | Admitting: Oncology

## 2023-02-09 DIAGNOSIS — C168 Malignant neoplasm of overlapping sites of stomach: Secondary | ICD-10-CM

## 2023-02-14 NOTE — Progress Notes (Signed)
 [facility]  [address] [place],  [place]  [address] [phone]  Clinic Day: 02/15/23  Referring physician: Jefferey Fitch, MD  ASSESSMENT & PLAN:   Assessment & Plan: Gastric cancer (HCC) Stage IVB (T4 N0 M1) poorly differentiated adenocarcinoma of the stomach with signet ring features and ulceration, metastatic to the omentum.  Stain for HER2 was negative.  PET scan revealed omental involvement, but no distant metastasis was seen. She is receiving palliative chemotherapy with FOLFOX/nivolumab  (5-fluorouracil /leucovorin /oxaliplatin /nivolumab ), which now consists of 5-fluorouracil /leucovorin /nivolumab .  Oxaliplatin  was discontinued after 11 cycles.  PET in February 2024 revealed resolution of metabolic activity associated with the stomach.  Decrease in size of omental nodularity. No associated metabolic activity.   EGD in March revealed residual disease.   CT chest, abdomen and pelvis in July revealed stable disease with persistent mild gastric wall thickening and subtle adjacent omental nodularity.  CT chest, abdomen pelvis and October revealed persistent wall thickening along the distal stomach with the adjacent stranding and soft tissue thickening, no additional areas of peritoneal nodularity or developing lymph node enlargement, no liver lesion and fatty liver infiltration. Once again changes interstitial lung disease with fibrosis, bronchiectasis were seen, as well as stable slight asymmetric thickening along the gallbladder, but the gallbladder is under-distended.  Her disease remains stable with treatment but still persists.  We will repeat scans in 3 to 4 months.   Drug-induced interstitial nephritis History immune mediated nephritis from immunotherapy in February, which resolved with prednisone  and holding the nivolumab .  She has had chronic kidney disease since that episode.  Her creatinine is stable.  We have continued to give her IV fluids the week of her  chemotherapy.     Hypokalemia Her potassium remains normal on potassium chloride  20 mEq twice daily, which we will continue.    Plan: She is here today for a 30th cycle of chemo-immunotherapy, which now consists of 5-fluorouracil /leucovorin /nivolumab  every 2 weeks.  She did skip a week at Christmas time and so it has been 3 weeks this time.  She tells me she has lost her partner of 47 years and is understandably grieving.  This was a sudden event and she gave him CPR at the home but he was found to be brain dead.  Her daughter is getting surgery today for her heart and another family member also died recently.  Since she is now living alone, we discussed the need for a lifeline and we will look into how to arrange this.  She continues to tolerate treatment fairly well. She reports stable neuropathy of the hands and feet.  She does have difficulty with fine motor skills.  She tried increasing her gabapentin  to 2 capsules daily, without much improvement, so she is only taking 1 at bedtime.  She is due for her annual mammogram in January and so I will schedule that.  We will plan to wait 3 to 4 months before repeating CT scans since the last 1 in October remains stable.  Her CBC is normal but her creatinine is higher at 1.44 with a BUN of 24, so we will resume IV fluids to be given along with her chemotherapy.  She will receive 1 L of normal saline over 2 hours.  She will return in 2 weeks with CBC, CMP, T4 and TSH for her next cycle.  I will schedule her mammogram for January 20 and plan repeat CT scans in February.  The patient understands the plans discussed today and is in  agreement with them.  She knows to contact our office if she develops concerns prior to her next visit.    I provided 30 minutes of face-to-face time during this encounter and > 50% was spent counseling as documented under my assessment and plan.    [name], MD  [facility] DEPT  OF [facility] [address] [place] [address] Dept: [phone] Dept Fax: [phone]   No orders of the defined types were placed in this encounter.     CHIEF COMPLAINT:  CC: Stage IVB gastric cancer  Current Treatment:  5-fluorouracil /leucovorin /nivoulmab  HISTORY OF PRESENT ILLNESS:   Oncology History  Gastric cancer (HCC)  09/10/2021 Initial Diagnosis   Gastric cancer (HCC)   09/10/2021 Cancer Staging   Staging form: Stomach, AJCC 8th Edition - Clinical stage from 09/10/2021: Stage IVB (cT4b, cN0, cM1) - Signed by Cornish Wanda VEAR, MD on 09/10/2021 Histopathologic type: Adenocarcinoma, NOS Stage prefix: Initial diagnosis Total positive nodes: 0 Histologic grade (G): G3 Histologic grading system: 3 grade system Sites of metastasis: Peritoneal surface Diagnostic confirmation: Positive histology PLUS positive immunophenotyping and/or positive genetic studies Specimen type: Endoscopy with Biopsy Staged by: Managing physician Carcinoembryonic antigen (CEA) (ng/mL): 2.8 Carbohydrate antigen 19-9 (CA 19-9) (U/mL): 4.9 HER2 status: Unknown Microsatellite instability (MSI): Unknown Tumor location in stomach: Other Clinical staging modalities: Biopsy, Endoscopy Stage used in treatment planning: Yes National guidelines used in treatment planning: Yes Type of national guideline used in treatment planning: NCCN   09/28/2021 - 10/14/2021 Chemotherapy   Patient is on Treatment Plan : GASTROESOPHAGEAL FOLFOX + Nivolumab  q14d     09/28/2021 -  Chemotherapy   Patient is on Treatment Plan : GASTROESOPHAGEAL FOLFOX + Nivolumab  q14d     12/09/2021 Genetic Testing   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  Report date is 12/09/2021.   The Multi-Cancer + RNA Panel offered by [facility] includes sequencing and/or deletion/duplication analysis of the following 84 genes:  AIP*, ALK, APC*, ATM*, AXIN2*, BAP1*, BARD1*,  BLM*, BMPR1A*, BRCA1*, BRCA2*, BRIP1*, CASR, CDC73*, CDH1*, CDK4, CDKN1B*, CDKN1C*, CDKN2A, CEBPA, CHEK2*, CTNNA1*, DICER1*, DIS3L2*, EGFR, EPCAM, FH*, FLCN*, GATA2*, GPC3, GREM1, HOXB13, HRAS, KIT, MAX*, MEN1*, MET, MITF, MLH1*, MSH2*, MSH3*, MSH6*, MUTYH*, NBN*, NF1*, NF2*, NTHL1*, PALB2*, PDGFRA, PHOX2B, PMS2*, POLD1*, POLE*, POT1*, PRKAR1A*, PTCH1*, PTEN*, RAD50*, RAD51C*, RAD51D*, RB1*, RECQL4, RET, RUNX1*, SDHA*, SDHAF2*, SDHB*, SDHC*, SDHD*, SMAD4*, SMARCA4*, SMARCB1*, SMARCE1*, STK11*, SUFU*, TERC, TERT, TMEM127*, Tp53*, TSC1*, TSC2*, VHL*, WRN*, and WT1.  RNA analysis is performed for * genes.       INTERVAL HISTORY:  Mackenzie Lewis is here today for repeat clinical assessment prior to a 30th cycle of chemo-immunotherapy, which now consists of 5-fluorouracil /leucovorin /nivolumab  every 2 weeks.  She did skip a week at Christmas time and so it has been 3 weeks this time.  She tells me she has lost her partner of 47 years and is understandably grieving.  This was a sudden event and she gave him CPR at the home but he was found to be brain dead.  Her daughter is getting surgery today for her heart and another family member also died recently.  Since she is now living alone, we discussed the need for a lifeline and we will look into how to arrange this.  She continues to tolerate treatment fairly well. She reports stable neuropathy of the hands and feet.  She does have difficulty with fine motor skills.  She  tried increasing her gabapentin  to 2 capsules daily, without much improvement, so she is only taking 1 at bedtime.  She is due for her annual mammogram in January and so I will get that scheduled.  We will plan to wait 3 to 4 months before repeating CT scans since the last 1 in October remains stable.  Her CBC is normal but her creatinine is higher at 1.44 with a BUN of 24, so we will resume IV fluids to be given along with her chemotherapy.  She will receive 1 L of normal saline over 2 hours.  She denies  fevers or chills. She denies pain. Her appetite is good. Her weight has increased 1 pounds over last 2 weeks .  She will return in 2 weeks with CBC, CMP, T4 and TSH for her next cycle.  I will schedule her mammogram for January 20 and plan repeat CT scans in February.  REVIEW OF SYSTEMS:  Review of Systems  Constitutional:  Negative for appetite change, chills, diaphoresis, fatigue, fever and unexpected weight change.  HENT:   Positive for nosebleeds (occasional mild bleeding). Negative for lump/mass, mouth sores and sore throat.   Respiratory:  Negative for cough and shortness of breath.   Cardiovascular:  Negative for chest pain and leg swelling.  Gastrointestinal:  Negative for abdominal pain, blood in stool, constipation, diarrhea, nausea and vomiting.  Genitourinary:  Negative for difficulty urinating, dysuria, frequency and hematuria.   Musculoskeletal:  Negative for arthralgias, back pain, gait problem and myalgias.  Skin:  Negative for itching and rash.  Neurological:  Positive for numbness (neuropathy bilateral hands and feet). Negative for dizziness, extremity weakness, gait problem, headaches and light-headedness.  Hematological:  Negative for adenopathy. Does not bruise/bleed easily.  Psychiatric/Behavioral:  Negative for depression and sleep disturbance. The patient is not nervous/anxious.      VITALS:  There were no vitals taken for this visit.  Wt Readings from Last 3 Encounters:  01/24/23 206 lb (93.4 kg)  01/18/23 202 lb 6.4 oz (91.8 kg)  01/12/23 204 lb 1.9 oz (92.6 kg)    There is no height or weight on file to calculate BMI.  Performance status (ECOG): 1 - Symptomatic but completely ambulatory  PHYSICAL EXAM:  Physical Exam Vitals and nursing note reviewed.  Constitutional:      General: She is not in acute distress.    Appearance: Normal appearance.  HENT:     Head: Normocephalic and atraumatic.     Mouth/Throat:     Mouth: Mucous membranes are moist.      Pharynx: Oropharynx is clear. No oropharyngeal exudate or posterior oropharyngeal erythema.  Eyes:     General: No scleral icterus.    Extraocular Movements: Extraocular movements intact.     Conjunctiva/sclera: Conjunctivae normal.     Pupils: Pupils are equal, round, and reactive to light.  Cardiovascular:     Rate and Rhythm: Normal rate and regular rhythm.     Heart sounds: Normal heart sounds. No murmur heard.    No friction rub. No gallop.  Pulmonary:     Effort: Pulmonary effort is normal.     Breath sounds: Normal breath sounds. No wheezing, rhonchi or rales.  Abdominal:     General: There is no distension.     Palpations: Abdomen is soft. There is no hepatomegaly, splenomegaly or mass.     Tenderness: There is no abdominal tenderness.  Musculoskeletal:        General: Normal range of motion.  Cervical back: Normal range of motion and neck supple. No tenderness.     Right lower leg: No edema.     Left lower leg: No edema.  Lymphadenopathy:     Cervical: No cervical adenopathy.     Upper Body:     Right upper body: No supraclavicular or axillary adenopathy.     Left upper body: No supraclavicular or axillary adenopathy.     Lower Body: No right inguinal adenopathy. No left inguinal adenopathy.  Skin:    General: Skin is warm and dry.     Coloration: Skin is not jaundiced.     Findings: No rash.  Neurological:     Mental Status: She is alert and oriented to person, place, and time.     Cranial Nerves: No cranial nerve deficit.  Psychiatric:        Mood and Affect: Mood normal.        Behavior: Behavior normal.        Thought Content: Thought content normal.    LABS:      Latest Ref Rng & Units 01/24/2023    8:11 AM 01/10/2023    8:44 AM 12/27/2022    9:05 AM  CBC  WBC 4.0 - 10.5 K/uL 6.2  6.2  7.7   Hemoglobin 12.0 - 15.0 g/dL 88.0  87.7  87.2   Hematocrit 36.0 - 46.0 % 33.9  34.9  35.5   Platelets 150 - 400 K/uL 153  162  156       Latest Ref Rng &  Units 01/24/2023    8:11 AM 01/10/2023    8:44 AM 12/27/2022    9:05 AM  CMP  Glucose 70 - 99 mg/dL 842  838  876   BUN 8 - 23 mg/dL 22  27  23    Creatinine 0.44 - 1.00 mg/dL 8.66  8.64  8.63   Sodium 135 - 145 mmol/L 140  138  140   Potassium 3.5 - 5.1 mmol/L 3.8  4.1  4.0   Chloride 98 - 111 mmol/L 106  105  107   CO2 22 - 32 mmol/L 23  22  20    Calcium  8.9 - 10.3 mg/dL 9.7  9.8  9.5   Total Protein 6.5 - 8.1 g/dL 6.2  6.3  6.5   Total Bilirubin <1.2 mg/dL 0.6  0.5  0.5   Alkaline Phos 38 - 126 U/L 83  88  83   AST 15 - 41 U/L 23  21  22    ALT 0 - 44 U/L 20  19  19       Lab Results  Component Value Date   CEA1 2.9 09/10/2021   /  CEA  Date Value Ref Range Status  09/10/2021 2.9 0.0 - 4.7 ng/mL Final    Comment:    (NOTE)                             Nonsmokers          <3.9                             Smokers             <5.6 [employer] Electrochemiluminescence Immunoassay (ECLIA) Values obtained with different assay methods or kits cannot be used interchangeably.  Results cannot be interpreted as absolute evidence of the presence or absence of malignant disease.  Performed At: [facility] [address] [place], [place] [id number] Jennette Shorter MD Ey:[phone]     STUDIES:  No results found.    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  If that happens, she is to call us  so  that we can schedule her for an ultrasound to look for synovitis.     Essential hypertension 09/09/2016   Gastric cancer (HCC) 09/10/2021   Hyperlipidemia 09/01/2016   Hypertension    Hypothyroidism 09/01/2016   Osteoarthritis of both feet 01/20/2016   Osteoarthritis, hand 01/20/2016   Thyroid  disease     Past Surgical History:  Procedure Laterality Date   APPENDECTOMY     LAPAROSCOPIC APPENDECTOMY N/A 04/10/2016   Procedure: APPENDECTOMY LAPAROSCOPIC;  Surgeon: Vicenta Poli, MD;  Location: [facility] OR;  Service: General;  Laterality: N/A;    Family History  Problem Relation Age of Onset   Hypertension Mother    Prostate cancer Father        metastatic; d. 40   Brain cancer Sister 94   Breast cancer Sister 21   AAA (abdominal aortic aneurysm) Brother    Leukemia Cousin        x2 maternal female cousins; d. before 35   Breast cancer Daughter 26       DCIS    Social History:  reports that she has never smoked. She has never used smokeless tobacco. She reports that she does not currently use alcohol . She reports that she does not use drugs.The patient is accompanied by her daughter today.  Allergies:  Allergies  Allergen Reactions   Doxycycline Rash   Sulfa Antibiotics Rash and Other (See Comments)    Other reaction(s): Other (See Comments)  Made me feel weird    Current Medications: Current Outpatient Medications  Medication Sig Dispense Refill   aspirin 81 MG EC tablet Take 81 mg by mouth daily. Swallow whole.     b complex vitamins capsule Take 1 capsule by mouth daily.     Calcium  Carbonate (CALCIUM  600 PO) Take 1 tablet by mouth daily.     Cholecalciferol (VITAMIN D3) 5000 units CAPS Take 1 capsule by mouth daily.     EXFORGE HCT 5-160-12.5 MG TABS Take 1 tablet by mouth daily.     famotidine (PEPCID) 40 MG tablet Take 40 mg by mouth at bedtime.     gabapentin  (NEURONTIN ) 100 MG capsule Take 1 capsule (100 mg total) by mouth at bedtime. For 2 weeks, then 2  capsules by mouth at bedtime (Patient taking differently: Take 100 mg by mouth at bedtime. For 2 weeks, then 2 capsules by mouth at bedtime Currently taking one at bedtime) 50 capsule 5   KRILL OIL PO Take 1 capsule by mouth daily. Unknown strenght     Lactobacillus TABS Take 1 tablet by mouth 2 (two) times daily.     levothyroxine  (SYNTHROID , LEVOTHROID) 75 MCG tablet Take 75 mcg by mouth daily before breakfast.  LORazepam  (ATIVAN ) 0.5 MG tablet Take 1 tablet (0.5 mg total) by mouth every 8 (eight) hours. 30 tablet 0   NON FORMULARY Take 1 Dose by mouth See admin instructions. MMW: 3 parts Maalox 2 parts Benadryl  1 part viscious lidicaine  Disp. 6oz  Instructions: 5ml swish and swallow every 3-4 hours     omeprazole  (PRILOSEC) 40 MG capsule Take 1 capsule (40 mg total) by mouth 2 (two) times daily. 60 capsule 5   ondansetron  (ZOFRAN -ODT) 4 MG disintegrating tablet Take 1 tablet (4 mg total) by mouth every 8 (eight) hours as needed for nausea or vomiting. 90 tablet 0   polyethylene glycol powder (GLYCOLAX /MIRALAX ) 17 GM/SCOOP powder Take 1 Container by mouth daily.     potassium chloride  SA (KLOR-CON  M) 20 MEQ tablet Take 1 tablet (20 mEq total) by mouth 2 (two) times daily. 180 tablet 1   pravastatin  (PRAVACHOL ) 20 MG tablet Take 1 tablet (20 mg total) by mouth every evening. 90 tablet 3   Probiotic Product (PROBIOTIC DAILY PO) Take 1 tablet by mouth daily.     prochlorperazine  (COMPAZINE ) 10 MG tablet Take 1 tablet (10 mg total) by mouth every 6 (six) hours as needed for nausea or vomiting. 90 tablet 3   triamcinolone  0.1% oint-Eucerin equivalent cream 1:1 mixture Apply topically 2 (two) times daily. (Patient not taking: Reported on 01/24/2023) 160 g 0   No current facility-administered medications for this visit.

## 2023-02-15 ENCOUNTER — Inpatient Hospital Stay (HOSPITAL_BASED_OUTPATIENT_CLINIC_OR_DEPARTMENT_OTHER): Payer: TRICARE For Life (TFL) | Admitting: Oncology

## 2023-02-15 ENCOUNTER — Inpatient Hospital Stay: Payer: Medicare Other

## 2023-02-15 ENCOUNTER — Encounter: Payer: Self-pay | Admitting: Oncology

## 2023-02-15 ENCOUNTER — Other Ambulatory Visit: Payer: Self-pay | Admitting: Oncology

## 2023-02-15 VITALS — BP 142/62 | HR 87 | Temp 98.0°F | Resp 18 | Ht 64.0 in | Wt 207.1 lb

## 2023-02-15 DIAGNOSIS — N142 Nephropathy induced by unspecified drug, medicament or biological substance: Secondary | ICD-10-CM

## 2023-02-15 DIAGNOSIS — C169 Malignant neoplasm of stomach, unspecified: Secondary | ICD-10-CM | POA: Diagnosis not present

## 2023-02-15 DIAGNOSIS — Z5111 Encounter for antineoplastic chemotherapy: Secondary | ICD-10-CM | POA: Diagnosis not present

## 2023-02-15 DIAGNOSIS — I129 Hypertensive chronic kidney disease with stage 1 through stage 4 chronic kidney disease, or unspecified chronic kidney disease: Secondary | ICD-10-CM | POA: Diagnosis not present

## 2023-02-15 DIAGNOSIS — C168 Malignant neoplasm of overlapping sites of stomach: Secondary | ICD-10-CM

## 2023-02-15 DIAGNOSIS — Z1231 Encounter for screening mammogram for malignant neoplasm of breast: Secondary | ICD-10-CM

## 2023-02-15 DIAGNOSIS — N182 Chronic kidney disease, stage 2 (mild): Secondary | ICD-10-CM | POA: Diagnosis not present

## 2023-02-15 DIAGNOSIS — E876 Hypokalemia: Secondary | ICD-10-CM | POA: Diagnosis not present

## 2023-02-15 DIAGNOSIS — Z79899 Other long term (current) drug therapy: Secondary | ICD-10-CM | POA: Diagnosis not present

## 2023-02-15 DIAGNOSIS — Z1239 Encounter for other screening for malignant neoplasm of breast: Secondary | ICD-10-CM

## 2023-02-15 LAB — CBC WITH DIFFERENTIAL (CANCER CENTER ONLY)
Abs Immature Granulocytes: 0.02 10*3/uL (ref 0.00–0.07)
Basophils Absolute: 0.1 10*3/uL (ref 0.0–0.1)
Basophils Relative: 1 %
Eosinophils Absolute: 0.3 10*3/uL (ref 0.0–0.5)
Eosinophils Relative: 5 %
HCT: 35.3 % — ABNORMAL LOW (ref 36.0–46.0)
Hemoglobin: 12.3 g/dL (ref 12.0–15.0)
Immature Granulocytes: 0 %
Lymphocytes Relative: 31 %
Lymphs Abs: 1.8 10*3/uL (ref 0.7–4.0)
MCH: 30.8 pg (ref 26.0–34.0)
MCHC: 34.8 g/dL (ref 30.0–36.0)
MCV: 88.3 fL (ref 80.0–100.0)
Monocytes Absolute: 0.6 10*3/uL (ref 0.1–1.0)
Monocytes Relative: 10 %
Neutro Abs: 3.1 10*3/uL (ref 1.7–7.7)
Neutrophils Relative %: 53 %
Platelet Count: 186 10*3/uL (ref 150–400)
RBC: 4 MIL/uL (ref 3.87–5.11)
RDW: 14.5 % (ref 11.5–15.5)
WBC Count: 5.9 10*3/uL (ref 4.0–10.5)
nRBC: 0 % (ref 0.0–0.2)
nRBC: 0 /100{WBCs}

## 2023-02-15 LAB — CMP (CANCER CENTER ONLY)
ALT: 23 U/L (ref 0–44)
AST: 21 U/L (ref 15–41)
Albumin: 4.2 g/dL (ref 3.5–5.0)
Alkaline Phosphatase: 96 U/L (ref 38–126)
Anion gap: 13 (ref 5–15)
BUN: 24 mg/dL — ABNORMAL HIGH (ref 8–23)
CO2: 21 mmol/L — ABNORMAL LOW (ref 22–32)
Calcium: 9.4 mg/dL (ref 8.9–10.3)
Chloride: 104 mmol/L (ref 98–111)
Creatinine: 1.44 mg/dL — ABNORMAL HIGH (ref 0.44–1.00)
GFR, Estimated: 35 mL/min — ABNORMAL LOW (ref >60)
Glucose, Bld: 183 mg/dL — ABNORMAL HIGH (ref 70–99)
Potassium: 4.1 mmol/L (ref 3.5–5.1)
Sodium: 137 mmol/L (ref 135–145)
Total Bilirubin: 0.5 mg/dL (ref 0.0–1.2)
Total Protein: 6.2 g/dL — ABNORMAL LOW (ref 6.5–8.1)

## 2023-02-15 LAB — TSH: TSH: 1.561 u[IU]/mL (ref 0.350–4.500)

## 2023-02-15 MED ORDER — SODIUM CHLORIDE 0.9 % IV SOLN
1920.0000 mg/m2 | INTRAVENOUS | Status: DC
  Administered 2023-02-15: 3500 mg via INTRAVENOUS
  Filled 2023-02-15: qty 70

## 2023-02-15 MED ORDER — PALONOSETRON HCL INJECTION 0.25 MG/5ML
0.2500 mg | Freq: Once | INTRAVENOUS | Status: AC
  Administered 2023-02-15: 0.25 mg via INTRAVENOUS
  Filled 2023-02-15: qty 5

## 2023-02-15 MED ORDER — LEUCOVORIN CALCIUM INJECTION 350 MG
320.0000 mg/m2 | Freq: Once | INTRAVENOUS | Status: AC
  Administered 2023-02-15: 630 mg via INTRAVENOUS
  Filled 2023-02-15: qty 31.5

## 2023-02-15 MED ORDER — DEXAMETHASONE SODIUM PHOSPHATE 10 MG/ML IJ SOLN
10.0000 mg | Freq: Once | INTRAMUSCULAR | Status: AC
  Administered 2023-02-15: 10 mg via INTRAVENOUS
  Filled 2023-02-15: qty 1

## 2023-02-15 MED ORDER — FLUOROURACIL CHEMO INJECTION 2.5 GM/50ML
320.0000 mg/m2 | Freq: Once | INTRAVENOUS | Status: AC
  Administered 2023-02-15: 650 mg via INTRAVENOUS
  Filled 2023-02-15: qty 13

## 2023-02-15 MED ORDER — SODIUM CHLORIDE 0.9 % IV SOLN: 10.0000 mg | Freq: Once | INTRAVENOUS | Status: DC

## 2023-02-15 MED ORDER — NIVOLUMAB CHEMO INJECTION 100 MG/10ML
240.0000 mg | Freq: Once | INTRAVENOUS | Status: AC
  Administered 2023-02-15: 240 mg via INTRAVENOUS
  Filled 2023-02-15: qty 24

## 2023-02-15 MED ORDER — SODIUM CHLORIDE 0.9 % IV SOLN: Freq: Once | INTRAVENOUS | Status: AC

## 2023-02-15 NOTE — Patient Instructions (Signed)
 Nivolumab  Injection What is this medication? NIVOLUMAB  (nye VOL ue mab) treats some types of cancer. It works by helping your immune system slow or stop the spread of cancer cells. It is a monoclonal antibody. This medicine may be used for other purposes; ask your health care provider or pharmacist if you have questions. COMMON BRAND NAME(S): Opdivo  What should I tell my care team before I take this medication? They need to know if you have any of these conditions: Allogeneic stem cell transplant (uses someone else's stem cells) Autoimmune diseases, such as Crohn disease, ulcerative colitis, lupus History of chest radiation Nervous system problems, such as Guillain-Barre syndrome or myasthenia gravis Organ transplant An unusual or allergic reaction to nivolumab , other medications, foods, dyes, or preservatives Pregnant or trying to get pregnant Breast-feeding How should I use this medication? This medication is infused into a vein. It is given in a hospital or clinic setting. A special MedGuide will be given to you before each treatment. Be sure to read this information carefully each time. Talk to your care team about the use of this medication in children. While it may be prescribed for children as young as 12 years for selected conditions, precautions do apply. Overdosage: If you think you have taken too much of this medicine contact a poison control center or emergency room at once. NOTE: This medicine is only for you. Do not share this medicine with others. What if I miss a dose? Keep appointments for follow-up doses. It is important not to miss your dose. Call your care team if you are unable to keep an appointment. What may interact with this medication? Interactions have not been studied. This list may not describe all possible interactions. Give your health care provider a list of all the medicines, herbs, non-prescription drugs, or dietary supplements you use. Also tell them if you  smoke, drink alcohol , or use illegal drugs. Some items may interact with your medicine. What should I watch for while using this medication? Your condition will be monitored carefully while you are receiving this medication. You may need blood work while taking this medication. This medication may cause serious skin reactions. They can happen weeks to months after starting the medication. Contact your care team right away if you notice fevers or flu-like symptoms with a rash. The rash may be red or purple and then turn into blisters or peeling of the skin. You may also notice a red rash with swelling of the face, lips, or lymph nodes in your neck or under your arms. Tell your care team right away if you have any change in your eyesight. Talk to your care team if you are pregnant or think you might be pregnant. A negative pregnancy test is required before starting this medication. A reliable form of contraception is recommended while taking this medication and for 5 months after the last dose. Talk to your care team about effective forms of contraception. Do not breast-feed while taking this medication and for 5 months after the last dose. What side effects may I notice from receiving this medication? Side effects that you should report to your care team as soon as possible: Allergic reactions--skin rash, itching, hives, swelling of the face, lips, tongue, or throat Dry cough, shortness of breath or trouble breathing Eye pain, redness, irritation, or discharge with blurry or decreased vision Heart muscle inflammation--unusual weakness or fatigue, shortness of breath, chest pain, fast or irregular heartbeat, dizziness, swelling of the ankles, feet, or hands Hormone  gland problems--headache, sensitivity to light, unusual weakness or fatigue, dizziness, fast or irregular heartbeat, increased sensitivity to cold or heat, excessive sweating, constipation, hair loss, increased thirst or amount of urine,  tremors or shaking, irritability Infusion reactions--chest pain, shortness of breath or trouble breathing, feeling faint or lightheaded Kidney injury (glomerulonephritis)--decrease in the amount of urine, red or dark brown urine, foamy or bubbly urine, swelling of the ankles, hands, or feet Liver injury--right upper belly pain, loss of appetite, nausea, light-colored stool, dark yellow or brown urine, yellowing skin or eyes, unusual weakness or fatigue Pain, tingling, or numbness in the hands or feet, muscle weakness, change in vision, confusion or trouble speaking, loss of balance or coordination, trouble walking, seizures Rash, fever, and swollen lymph nodes Redness, blistering, peeling, or loosening of the skin, including inside the mouth Sudden or severe stomach pain, bloody diarrhea, fever, nausea, vomiting Side effects that usually do not require medical attention (report these to your care team if they continue or are bothersome): Bone, joint, or muscle pain Diarrhea Fatigue Loss of appetite Nausea Skin rash This list may not describe all possible side effects. Call your doctor for medical advice about side effects. You may report side effects to FDA at 1-800-FDA-1088. Where should I keep my medication? This medication is given in a hospital or clinic. It will not be stored at home. NOTE: This sheet is a summary. It may not cover all possible information. If you have questions about this medicine, talk to your doctor, pharmacist, or health care provider.  2024 Elsevier/Gold Standard (2021-06-01 00:00:00) Fluorouracil  Injection What is this medication? FLUOROURACIL  (flure oh YOOR a sil) treats some types of cancer. It works by slowing down the growth of cancer cells. This medicine may be used for other purposes; ask your health care provider or pharmacist if you have questions. COMMON BRAND NAME(S): Adrucil  What should I tell my care team before I take this medication? They need to  know if you have any of these conditions: Blood disorders Dihydropyrimidine dehydrogenase (DPD) deficiency Infection, such as chickenpox, cold sores, herpes Kidney disease Liver disease Poor nutrition Recent or ongoing radiation therapy An unusual or allergic reaction to fluorouracil , other medications, foods, dyes, or preservatives If you or your partner are pregnant or trying to get pregnant Breast-feeding How should I use this medication? This medication is injected into a vein. It is administered by your care team in a hospital or clinic setting. Talk to your care team about the use of this medication in children. Special care may be needed. Overdosage: If you think you have taken too much of this medicine contact a poison control center or emergency room at once. NOTE: This medicine is only for you. Do not share this medicine with others. What if I miss a dose? Keep appointments for follow-up doses. It is important not to miss your dose. Call your care team if you are unable to keep an appointment. What may interact with this medication? Do not take this medication with any of the following: Live virus vaccines This medication may also interact with the following: Medications that treat or prevent blood clots, such as warfarin, enoxaparin , dalteparin This list may not describe all possible interactions. Give your health care provider a list of all the medicines, herbs, non-prescription drugs, or dietary supplements you use. Also tell them if you smoke, drink alcohol , or use illegal drugs. Some items may interact with your medicine. What should I watch for while using this medication? Your condition  will be monitored carefully while you are receiving this medication. This medication may make you feel generally unwell. This is not uncommon as chemotherapy can affect healthy cells as well as cancer cells. Report any side effects. Continue your course of treatment even though you feel ill  unless your care team tells you to stop. In some cases, you may be given additional medications to help with side effects. Follow all directions for their use. This medication may increase your risk of getting an infection. Call your care team for advice if you get a fever, chills, sore throat, or other symptoms of a cold or flu. Do not treat yourself. Try to avoid being around people who are sick. This medication may increase your risk to bruise or bleed. Call your care team if you notice any unusual bleeding. Be careful brushing or flossing your teeth or using a toothpick because you may get an infection or bleed more easily. If you have any dental work done, tell your dentist you are receiving this medication. Avoid taking medications that contain aspirin, acetaminophen , ibuprofen, naproxen, or ketoprofen unless instructed by your care team. These medications may hide a fever. Do not treat diarrhea with over the counter products. Contact your care team if you have diarrhea that lasts more than 2 days or if it is severe and watery. This medication can make you more sensitive to the sun. Keep out of the sun. If you cannot avoid being in the sun, wear protective clothing and sunscreen. Do not use sun lamps, tanning beds, or tanning booths. Talk to your care team if you or your partner wish to become pregnant or think you might be pregnant. This medication can cause serious birth defects if taken during pregnancy and for 3 months after the last dose. A reliable form of contraception is recommended while taking this medication and for 3 months after the last dose. Talk to your care team about effective forms of contraception. Do not father a child while taking this medication and for 3 months after the last dose. Use a condom while having sex during this time period. Do not breastfeed while taking this medication. This medication may cause infertility. Talk to your care team if you are concerned about your  fertility. What side effects may I notice from receiving this medication? Side effects that you should report to your care team as soon as possible: Allergic reactions--skin rash, itching, hives, swelling of the face, lips, tongue, or throat Heart attack--pain or tightness in the chest, shoulders, arms, or jaw, nausea, shortness of breath, cold or clammy skin, feeling faint or lightheaded Heart failure--shortness of breath, swelling of the ankles, feet, or hands, sudden weight gain, unusual weakness or fatigue Heart rhythm changes--fast or irregular heartbeat, dizziness, feeling faint or lightheaded, chest pain, trouble breathing High ammonia level--unusual weakness or fatigue, confusion, loss of appetite, nausea, vomiting, seizures Infection--fever, chills, cough, sore throat, wounds that don't heal, pain or trouble when passing urine, general feeling of discomfort or being unwell Low red blood cell level--unusual weakness or fatigue, dizziness, headache, trouble breathing Pain, tingling, or numbness in the hands or feet, muscle weakness, change in vision, confusion or trouble speaking, loss of balance or coordination, trouble walking, seizures Redness, swelling, and blistering of the skin over hands and feet Severe or prolonged diarrhea Unusual bruising or bleeding Side effects that usually do not require medical attention (report to your care team if they continue or are bothersome): Dry skin Headache Increased tears Nausea Pain,  redness, or swelling with sores inside the mouth or throat Sensitivity to light Vomiting This list may not describe all possible side effects. Call your doctor for medical advice about side effects. You may report side effects to FDA at 1-800-FDA-1088. Where should I keep my medication? This medication is given in a hospital or clinic. It will not be stored at home. NOTE: This sheet is a summary. It may not cover all possible information. If you have questions  about this medicine, talk to your doctor, pharmacist, or health care provider.  2024 Elsevier/Gold Standard (2021-06-09 00:00:00) Leucovorin  Injection What is this medication? LEUCOVORIN  (loo koe VOR in) prevents side effects from certain medications, such as methotrexate. It works by increasing folate levels. This helps protect healthy cells in your body. It may also be used to treat anemia caused by low levels of folate. It can also be used with fluorouracil , a type of chemotherapy, to treat colorectal cancer. It works by increasing the effects of fluorouracil  in the body. This medicine may be used for other purposes; ask your health care provider or pharmacist if you have questions. What should I tell my care team before I take this medication? They need to know if you have any of these conditions: Anemia from low levels of vitamin B12 in the blood An unusual or allergic reaction to leucovorin , folic acid, other medications, foods, dyes, or preservatives Pregnant or trying to get pregnant Breastfeeding How should I use this medication? This medication is injected into a vein or a muscle. It is given by your care team in a hospital or clinic setting. Talk to your care team about the use of this medication in children. Special care may be needed. Overdosage: If you think you have taken too much of this medicine contact a poison control center or emergency room at once. NOTE: This medicine is only for you. Do not share this medicine with others. What if I miss a dose? Keep appointments for follow-up doses. It is important not to miss your dose. Call your care team if you are unable to keep an appointment. What may interact with this medication? Capecitabine Fluorouracil  Phenobarbital Phenytoin Primidone Trimethoprim;sulfamethoxazole This list may not describe all possible interactions. Give your health care provider a list of all the medicines, herbs, non-prescription drugs, or dietary  supplements you use. Also tell them if you smoke, drink alcohol , or use illegal drugs. Some items may interact with your medicine. What should I watch for while using this medication? Your condition will be monitored carefully while you are receiving this medication. This medication may increase the side effects of 5-fluorouracil . Tell your care team if you have diarrhea or mouth sores that do not get better or that get worse. What side effects may I notice from receiving this medication? Side effects that you should report to your care team as soon as possible: Allergic reactions--skin rash, itching, hives, swelling of the face, lips, tongue, or throat This list may not describe all possible side effects. Call your doctor for medical advice about side effects. You may report side effects to FDA at 1-800-FDA-1088. Where should I keep my medication? This medication is given in a hospital or clinic. It will not be stored at home. NOTE: This sheet is a summary. It may not cover all possible information. If you have questions about this medicine, talk to your doctor, pharmacist, or health care provider.  2024 Elsevier/Gold Standard (2021-07-07 00:00:00)

## 2023-02-16 LAB — T4: T4, Total: 9.7 ug/dL (ref 4.5–12.0)

## 2023-02-17 ENCOUNTER — Telehealth: Payer: Self-pay

## 2023-02-17 ENCOUNTER — Inpatient Hospital Stay: Payer: Medicare Other | Attending: Hematology and Oncology

## 2023-02-17 VITALS — BP 133/55 | HR 81 | Temp 98.3°F | Resp 18

## 2023-02-17 DIAGNOSIS — Z1509 Genetic susceptibility to other malignant neoplasm: Secondary | ICD-10-CM | POA: Diagnosis not present

## 2023-02-17 DIAGNOSIS — K828 Other specified diseases of gallbladder: Secondary | ICD-10-CM | POA: Insufficient documentation

## 2023-02-17 DIAGNOSIS — E785 Hyperlipidemia, unspecified: Secondary | ICD-10-CM | POA: Insufficient documentation

## 2023-02-17 DIAGNOSIS — E039 Hypothyroidism, unspecified: Secondary | ICD-10-CM | POA: Diagnosis not present

## 2023-02-17 DIAGNOSIS — C786 Secondary malignant neoplasm of retroperitoneum and peritoneum: Secondary | ICD-10-CM | POA: Diagnosis not present

## 2023-02-17 DIAGNOSIS — J8410 Pulmonary fibrosis, unspecified: Secondary | ICD-10-CM | POA: Insufficient documentation

## 2023-02-17 DIAGNOSIS — R7989 Other specified abnormal findings of blood chemistry: Secondary | ICD-10-CM | POA: Insufficient documentation

## 2023-02-17 DIAGNOSIS — E876 Hypokalemia: Secondary | ICD-10-CM | POA: Diagnosis not present

## 2023-02-17 DIAGNOSIS — Z5111 Encounter for antineoplastic chemotherapy: Secondary | ICD-10-CM | POA: Insufficient documentation

## 2023-02-17 DIAGNOSIS — Z1501 Genetic susceptibility to malignant neoplasm of breast: Secondary | ICD-10-CM | POA: Insufficient documentation

## 2023-02-17 DIAGNOSIS — I129 Hypertensive chronic kidney disease with stage 1 through stage 4 chronic kidney disease, or unspecified chronic kidney disease: Secondary | ICD-10-CM | POA: Diagnosis not present

## 2023-02-17 DIAGNOSIS — N182 Chronic kidney disease, stage 2 (mild): Secondary | ICD-10-CM | POA: Diagnosis not present

## 2023-02-17 DIAGNOSIS — C168 Malignant neoplasm of overlapping sites of stomach: Secondary | ICD-10-CM

## 2023-02-17 DIAGNOSIS — J479 Bronchiectasis, uncomplicated: Secondary | ICD-10-CM | POA: Insufficient documentation

## 2023-02-17 DIAGNOSIS — Z79899 Other long term (current) drug therapy: Secondary | ICD-10-CM | POA: Insufficient documentation

## 2023-02-17 DIAGNOSIS — C169 Malignant neoplasm of stomach, unspecified: Secondary | ICD-10-CM | POA: Diagnosis not present

## 2023-02-17 MED ORDER — SODIUM CHLORIDE 0.9 % IV SOLN: Freq: Once | INTRAVENOUS | Status: AC

## 2023-02-17 MED ORDER — SODIUM CHLORIDE 0.9% FLUSH
10.0000 mL | INTRAVENOUS | Status: DC | PRN
Start: 2023-02-17 — End: 2023-02-17
  Administered 2023-02-17: 10 mL

## 2023-02-17 MED ORDER — HEPARIN SOD (PORK) LOCK FLUSH 100 UNIT/ML IV SOLN
500.0000 [IU] | Freq: Once | INTRAVENOUS | Status: AC | PRN
  Administered 2023-02-17: 500 [IU]

## 2023-02-17 NOTE — Patient Instructions (Signed)
 Dehydration, Adult Dehydration is a condition in which there is not enough water or other fluids in the body. This happens when a person loses more fluids than they take in. Important organs cannot work right without the right amount of fluids. Any loss of fluids from the body can cause dehydration. Dehydration can be mild, worse, or very bad. It should be treated right away to keep it from getting very bad. What are the causes? Conditions that cause loss of water in the body. They include: Watery poop (diarrhea). Vomiting. Sweating a lot. Fever. Infection. Peeing (urinating) a lot. Not drinking enough fluids. Certain medicines, such as medicines that take extra fluid out of the body (diuretics). Lack of safe drinking water. Not being able to get enough water and food. What increases the risk? Having a long-term (chronic) illness that has not been treated the right way, such as: Diabetes. Heart disease. Kidney disease. Being 25 years of age or older. Having a disability. Living in a place that is high above the ground or sea (high in altitude). The thinner, drier air causes more fluid loss. Doing exercises that put stress on your body for a long time. Being active when in hot places. What are the signs or symptoms? Symptoms of dehydration depend on how bad it is. Mild or worse dehydration Thirst. Dry lips or dry mouth. Feeling dizzy or light-headed. Muscle cramps. Passing little pee or dark pee. Pee may be the color of tea. Headache. Very bad dehydration Changes in skin. Skin may: Be cold to the touch (clammy). Be blotchy or pale. Not go back to normal right after you pinch it and let it go. Little or no tears, pee, or sweat. Fast breathing. Low blood pressure. Weak pulse. Pulse that is more than 100 beats a minute when you are sitting still. Other changes, such as: Feeling very thirsty. Eyes that look hollow (sunken). Cold hands and feet. Being confused. Being very  tired (lethargic) or having trouble waking from sleep. Losing weight. Loss of consciousness. How is this treated? Treatment for this condition depends on how bad your dehydration is. Treatment should start right away. Do not wait until your condition gets very bad. Very bad dehydration is an emergency. You will need to go to a hospital. Mild or worse dehydration can be treated at home. You may be asked to: Drink more fluids. Drink an oral rehydration solution (ORS). This drink gives you the right amount of fluids, salts, and minerals (electrolytes). Very bad dehydration can be treated: With fluids through an IV tube. By correcting low levels of electrolytes in the body. By treating the problem that caused your dehydration. Follow these instructions at home: Oral rehydration solution If told by your doctor, drink an ORS: Make an ORS. Use instructions on the package. Start by drinking small amounts, about  cup (120 mL) every 5-10 minutes. Slowly drink more until you have had the amount that your doctor said to have.  Eating and drinking  Drink enough clear fluid to keep your pee pale yellow. If you were told to drink an ORS, finish the ORS first. Then, start slowly drinking other clear fluids. Drink fluids such as: Water. Do not drink only water. Doing that can make the salt (sodium) level in your body get too low. Water from ice chips you suck on. Fruit juice that you have added water to (diluted). Low-calorie sports drinks. Eat foods that have the right amounts of salts and minerals, such as bananas, oranges, potatoes,  tomatoes, or spinach. Do not drink alcohol. Avoid drinks that have caffeine or sugar. These include:: High-calorie sports drinks. Fruit juice that you did not add water to. Soda. Coffee or energy drinks. Avoid foods that are greasy or have a lot of fat or sugar. General instructions Take over-the-counter and prescription medicines only as told by your doctor. Do  not take sodium tablets. Doing that can make the salt level in your body get too high. Return to your normal activities as told by your doctor. Ask your doctor what activities are safe for you. Keep all follow-up visits. Your doctor may check and change your treatment. Contact a doctor if: You have pain in your belly (abdomen) and the pain: Gets worse. Stays in one place. You have a rash. You have a stiff neck. You get angry or annoyed more easily than normal. You are more tired or have a harder time waking than normal. You feel weak or dizzy. You feel very thirsty. Get help right away if: You have any symptoms of very bad dehydration. You vomit every time you eat or drink. Your vomiting gets worse, does not go away, or you vomit blood or green stuff. You are getting treatment, but symptoms are getting worse. You have a fever. You have a very bad headache. You have: Diarrhea that gets worse or does not go away. Blood in your poop (stool). This may cause poop to look black and tarry. No pee in 6-8 hours. Only a small amount of pee in 6-8 hours, and the pee is very dark. You have trouble breathing. These symptoms may be an emergency. Get help right away. Call 911. Do not wait to see if the symptoms will go away. Do not drive yourself to the hospital. This information is not intended to replace advice given to you by your health care provider. Make sure you discuss any questions you have with your health care provider. Document Revised: 08/31/2021 Document Reviewed: 08/31/2021 Elsevier Patient Education  2024 [employer].

## 2023-02-17 NOTE — Telephone Encounter (Signed)
 [facility] Clinical Social Work  Clinical Social Work was referred by nurse for assessment of psychosocial needs.  Clinical Social Worker attempted to contact patient by phone to offer support and assess for needs.   CSW left message for patient.   Lizbeth Sprague, LCSW  Clinical Social Worker [facility]

## 2023-03-01 ENCOUNTER — Encounter: Payer: Self-pay | Admitting: Hematology and Oncology

## 2023-03-01 ENCOUNTER — Inpatient Hospital Stay: Payer: Medicare Other

## 2023-03-01 ENCOUNTER — Inpatient Hospital Stay (HOSPITAL_BASED_OUTPATIENT_CLINIC_OR_DEPARTMENT_OTHER): Payer: Medicare Other | Admitting: Hematology and Oncology

## 2023-03-01 VITALS — BP 129/63 | HR 91 | Temp 98.2°F | Wt 208.0 lb

## 2023-03-01 VITALS — Resp 18

## 2023-03-01 DIAGNOSIS — C169 Malignant neoplasm of stomach, unspecified: Secondary | ICD-10-CM

## 2023-03-01 DIAGNOSIS — C786 Secondary malignant neoplasm of retroperitoneum and peritoneum: Secondary | ICD-10-CM

## 2023-03-01 DIAGNOSIS — Z5111 Encounter for antineoplastic chemotherapy: Secondary | ICD-10-CM | POA: Diagnosis not present

## 2023-03-01 DIAGNOSIS — E876 Hypokalemia: Secondary | ICD-10-CM

## 2023-03-01 DIAGNOSIS — C168 Malignant neoplasm of overlapping sites of stomach: Secondary | ICD-10-CM

## 2023-03-01 DIAGNOSIS — K828 Other specified diseases of gallbladder: Secondary | ICD-10-CM | POA: Diagnosis not present

## 2023-03-01 DIAGNOSIS — Z79899 Other long term (current) drug therapy: Secondary | ICD-10-CM

## 2023-03-01 DIAGNOSIS — J8410 Pulmonary fibrosis, unspecified: Secondary | ICD-10-CM | POA: Diagnosis not present

## 2023-03-01 DIAGNOSIS — N142 Nephropathy induced by unspecified drug, medicament or biological substance: Secondary | ICD-10-CM

## 2023-03-01 DIAGNOSIS — J479 Bronchiectasis, uncomplicated: Secondary | ICD-10-CM | POA: Diagnosis not present

## 2023-03-01 LAB — CBC WITH DIFFERENTIAL (CANCER CENTER ONLY)
Abs Immature Granulocytes: 0.01 10*3/uL (ref 0.00–0.07)
Basophils Absolute: 0.1 10*3/uL (ref 0.0–0.1)
Basophils Relative: 1 %
Eosinophils Absolute: 0.3 10*3/uL (ref 0.0–0.5)
Eosinophils Relative: 3 %
HCT: 36 % (ref 36.0–46.0)
Hemoglobin: 12.6 g/dL (ref 12.0–15.0)
Immature Granulocytes: 0 %
Lymphocytes Relative: 23 %
Lymphs Abs: 1.7 10*3/uL (ref 0.7–4.0)
MCH: 31 pg (ref 26.0–34.0)
MCHC: 35 g/dL (ref 30.0–36.0)
MCV: 88.5 fL (ref 80.0–100.0)
Monocytes Absolute: 0.7 10*3/uL (ref 0.1–1.0)
Monocytes Relative: 9 %
Neutro Abs: 5 10*3/uL (ref 1.7–7.7)
Neutrophils Relative %: 64 %
Platelet Count: 186 10*3/uL (ref 150–400)
RBC: 4.07 MIL/uL (ref 3.87–5.11)
RDW: 14.5 % (ref 11.5–15.5)
WBC Count: 7.7 10*3/uL (ref 4.0–10.5)
nRBC: 0 % (ref 0.0–0.2)
nRBC: 0 /100{WBCs}

## 2023-03-01 LAB — CMP (CANCER CENTER ONLY)
ALT: 22 U/L (ref 0–44)
AST: 21 U/L (ref 15–41)
Albumin: 4.4 g/dL (ref 3.5–5.0)
Alkaline Phosphatase: 90 U/L (ref 38–126)
Anion gap: 12 (ref 5–15)
BUN: 21 mg/dL (ref 8–23)
CO2: 22 mmol/L (ref 22–32)
Calcium: 9.9 mg/dL (ref 8.9–10.3)
Chloride: 105 mmol/L (ref 98–111)
Creatinine: 1.19 mg/dL — ABNORMAL HIGH (ref 0.44–1.00)
GFR, Estimated: 45 mL/min — ABNORMAL LOW (ref >60)
Glucose, Bld: 110 mg/dL — ABNORMAL HIGH (ref 70–99)
Potassium: 4.2 mmol/L (ref 3.5–5.1)
Sodium: 139 mmol/L (ref 135–145)
Total Bilirubin: 0.4 mg/dL (ref 0.0–1.2)
Total Protein: 6.5 g/dL (ref 6.5–8.1)

## 2023-03-01 LAB — TSH: TSH: 1.586 u[IU]/mL (ref 0.350–4.500)

## 2023-03-01 MED ORDER — PALONOSETRON HCL INJECTION 0.25 MG/5ML
0.2500 mg | Freq: Once | INTRAVENOUS | Status: AC
Start: 2023-03-01 — End: 2023-03-01
  Administered 2023-03-01: 0.25 mg via INTRAVENOUS
  Filled 2023-03-01: qty 5

## 2023-03-01 MED ORDER — ALTEPLASE 2 MG IJ SOLR
2.0000 mg | Freq: Once | INTRAMUSCULAR | Status: AC | PRN
  Administered 2023-03-01: 2 mg
  Filled 2023-03-01: qty 2

## 2023-03-01 MED ORDER — SODIUM CHLORIDE 0.9 % IV SOLN: Freq: Once | INTRAVENOUS | Status: AC

## 2023-03-01 MED ORDER — FLUOROURACIL CHEMO INJECTION 5 GM/100ML
1960.0000 mg/m2 | INTRAVENOUS | Status: DC
  Administered 2023-03-01: 3850 mg via INTRAVENOUS
  Filled 2023-03-01: qty 77

## 2023-03-01 MED ORDER — DEXTROSE 5 % IV SOLN: Freq: Once | INTRAVENOUS | Status: AC

## 2023-03-01 MED ORDER — DEXAMETHASONE SODIUM PHOSPHATE 10 MG/ML IJ SOLN
10.0000 mg | Freq: Once | INTRAMUSCULAR | Status: AC
  Administered 2023-03-01: 10 mg via INTRAVENOUS
  Filled 2023-03-01: qty 1

## 2023-03-01 MED ORDER — FLUOROURACIL CHEMO INJECTION 2.5 GM/50ML
320.0000 mg/m2 | Freq: Once | INTRAVENOUS | Status: AC
Start: 2023-03-01 — End: 2023-03-01
  Administered 2023-03-01: 650 mg via INTRAVENOUS
  Filled 2023-03-01: qty 13

## 2023-03-01 MED ORDER — SODIUM CHLORIDE 0.9 % IV SOLN
240.0000 mg | Freq: Once | INTRAVENOUS | Status: AC
  Administered 2023-03-01: 240 mg via INTRAVENOUS
  Filled 2023-03-01: qty 24

## 2023-03-01 MED ORDER — DEXTROSE 5 % IV SOLN
320.0000 mg/m2 | Freq: Once | INTRAVENOUS | Status: AC
  Administered 2023-03-01: 630 mg via INTRAVENOUS
  Filled 2023-03-01: qty 31.5

## 2023-03-01 MED ORDER — HEPARIN SOD (PORK) LOCK FLUSH 100 UNIT/ML IV SOLN: 500.0000 [IU] | Freq: Once | INTRAVENOUS | Status: DC | PRN

## 2023-03-01 MED ORDER — SODIUM CHLORIDE 0.9% FLUSH: 10.0000 mL | INTRAVENOUS | Status: DC | PRN

## 2023-03-01 NOTE — Assessment & Plan Note (Signed)
 Her potassium remains normal on potassium chloride 20 mEq twice daily, which we will continue.

## 2023-03-01 NOTE — Progress Notes (Signed)
 4098 ALTPLASED PORT NO BLOOD RETURNED. NURSE WILL CHECK AGAIN IN 30 MINUTES FOR BLOOD RETURNED. HAD LAB TO DO LABS PERIPHERALLY.

## 2023-03-01 NOTE — Assessment & Plan Note (Signed)
 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  Stain for HER2 was negative.  PET scan revealed omental involvement, but no distant metastasis was seen. She is receiving palliative chemotherapy with FOLFOX/nivolumab  (5-fluorouracil /leucovorin /oxaliplatin /nivolumab ), which now consists of 5-fluorouracil /leucovorin /nivolumab .  Oxaliplatin  was discontinued after 11 cycles.  PET in February 2024 revealed resolution of metabolic activity associated with the stomach.  Decrease in size of omental nodularity. No associated metabolic activity.   EGD in March revealed residual disease.  CT chest, abdomen and pelvis in July revealed stable disease with persistent mild gastric wall thickening and subtle adjacent omental nodularity.  CT chest, abdomen pelvis and October revealed persistent wall thickening along the distal stomach with the adjacent stranding and soft tissue thickening, no additional areas of peritoneal nodularity or developing lymph node enlargement, no liver lesion and fatty liver infiltration. Once again changes interstitial lung disease with fibrosis, bronchiectasis were seen, as well as stable slight asymmetric thickening along the gallbladder, but the gallbladder is under-distended.  Her disease remains stable with treatment but still persists, so we recommended we continue the maintenance treatment and repeat imaging in 3 months.   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  She receives extra IV fluids with her infusion to prevent recurrent acute kidney injury.  Her kidney function is improved today.  She will proceed with 31st of 5-fluorouracil /leucovorin /nivolumab  cycle today. We will plan to see her back in 2 weeks with a CBC, comprehensive metabolic panel, and TSH prior to a 32nd cycle.

## 2023-03-01 NOTE — Progress Notes (Signed)
 [facility]  [address] [place],  [place]  [address] [phone]  Clinic Day:  03/01/2023  Referring physician: Jefferey Fitch, MD  ASSESSMENT & PLAN:   Assessment & Plan: Gastric cancer (HCC) Stage IVB (T4 N0 M1) poorly differentiated adenocarcinoma of the stomach with signet ring features and ulceration, metastatic to the omentum.  Stain for HER2 was negative.  PET scan revealed omental involvement, but no distant metastasis was seen. She is receiving palliative chemotherapy with FOLFOX/nivolumab  (5-fluorouracil /leucovorin /oxaliplatin /nivolumab ), which now consists of 5-fluorouracil /leucovorin /nivolumab .  Oxaliplatin  was discontinued after 11 cycles.  PET in February 2024 revealed resolution of metabolic activity associated with the stomach.  Decrease in size of omental nodularity. No associated metabolic activity.   EGD in March revealed residual disease.  CT chest, abdomen and pelvis in July revealed stable disease with persistent mild gastric wall thickening and subtle adjacent omental nodularity.  CT chest, abdomen pelvis and October revealed persistent wall thickening along the distal stomach with the adjacent stranding and soft tissue thickening, no additional areas of peritoneal nodularity or developing lymph node enlargement, no liver lesion and fatty liver infiltration. Once again changes interstitial lung disease with fibrosis, bronchiectasis were seen, as well as stable slight asymmetric thickening along the gallbladder, but the gallbladder is under-distended.  Her disease remains stable with treatment but still persists, so we recommended we continue the maintenance treatment and repeat imaging in 3 months.   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  She receives extra IV fluids with her infusion to prevent recurrent acute kidney injury.  Her kidney  function is improved today.  She will proceed with 31st of 5-fluorouracil /leucovorin /nivolumab  cycle today. We will plan to see her back in 2 weeks with a CBC, comprehensive metabolic panel, and TSH prior to a 32nd cycle.  Hypokalemia Her potassium remains normal on potassium chloride  20 mEq twice daily, which we will continue.  Drug-induced interstitial nephritis History immune mediated nephritis from immunotherapy in February, which resolved with prednisone .  She has had chronic kidney disease since that episode.  Her creatinine is improved.  We have continued to give her IV fluids the week of her chemotherapy.      The patient understands the plans discussed today and is in agreement with them.  She knows to contact our office if she develops concerns prior to her next appointment.   I provided 30 minutes of face-to-face time during this encounter and > 50% was spent counseling as documented under my assessment and plan.    Mackenzie Hevener [name], PA-C  [facility] [place] - A DEPT OF [facility] [address] [place] [address] Dept: [phone] Dept Fax: [phone]   No orders of the defined types were placed in this encounter.     CHIEF COMPLAINT:  CC: Stage IVB stomach  Current Treatment:  Fluorouracil /leucovorin /nivolumab   HISTORY OF PRESENT ILLNESS:   Oncology History  Gastric cancer (HCC)  09/10/2021 Initial Diagnosis   Gastric cancer (HCC)   09/10/2021 Cancer Staging   Staging form: Stomach, AJCC 8th Edition - Clinical stage from 09/10/2021: Stage IVB (cT4b, cN0, cM1) - Signed by Cornelius Wanda VE[facility], MD on 09/10/2021 Histopathologic type: Adenocarcinoma, NOS Stage prefix: Initial diagnosis Total positive nodes: 0 Histologic grade (G): G3 Histologic grading system: 3 grade system Sites of metastasis: Peritoneal surface Diagnostic confirmation: Positive histology PLUS positive immunophenotyping and/or positive genetic  studies Specimen type: Endoscopy with Biopsy Staged by: Managing physician Carcinoembryonic antigen (CEA) (ng/mL): 2.8 Carbohydrate antigen 19-9 (CA 19-9) (U/mL): 4.9 HER2 status: Unknown Microsatellite instability (MSI): Unknown Tumor location in stomach: Other Clinical staging modalities: Biopsy, Endoscopy Stage used in treatment planning: Yes National guidelines used in treatment planning: Yes Type of national guideline used in treatment planning: NCCN   09/28/2021 - 10/14/2021 Chemotherapy   Patient is on Treatment Plan : GASTROESOPHAGEAL FOLFOX + Nivolumab  q14d     09/28/2021 -  Chemotherapy   Patient is on Treatment Plan : GASTROESOPHAGEAL FOLFOX + Nivolumab  q14d     12/09/2021 Genetic Testing   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  Report date is 12/09/2021.   The Multi-Cancer + RNA Panel offered by [facility] includes sequencing and/or deletion/duplication analysis of the following 84 genes:  AIP*, ALK, APC*, ATM*, AXIN2*, BAP1*, BARD1*, BLM*, BMPR1A*, BRCA1*, BRCA2*, BRIP1*, CASR, CDC73*, CDH1*, CDK4, CDKN1B*, CDKN1C*, CDKN2A, CEBPA, CHEK2*, CTNNA1*, DICER1*, DIS3L2*, EGFR, EPCAM, FH*, FLCN*, GATA2*, GPC3, GREM1, HOXB13, HRAS, KIT, MAX*, MEN1*, MET, MITF, MLH1*, MSH2*, MSH3*, MSH6*, MUTYH*, NBN*, NF1*, NF2*, NTHL1*, PALB2*, PDGFRA, PHOX2B, PMS2*, POLD1*, POLE*, POT1*, PRKAR1A*, PTCH1*, PTEN*, RAD50*, RAD51C*, RAD51D*, RB1*, RECQL4, RET, RUNX1*, SDHA*, SDHAF2*, SDHB*, SDHC*, SDHD*, SMAD4*, SMARCA4*, SMARCB1*, SMARCE1*, STK11*, SUFU*, TERC, TERT, TMEM127*, Tp53*, TSC1*, TSC2*, VHL*, WRN*, and WT1.  RNA analysis is performed for * genes.       INTERVAL HISTORY:  Mackenzie Lewis is here today for repeat clinical assessment prior to 31st cycle of fluorouracil /leucovorin /nivolumab . She reports stable neuropathy of the hands and feet. She states she has occasional light-headedness. She reports stable blurry vision which she states her eye doctor  attributed to therapy.  She denies eye pain or redness.  She has intermittent pain in the left shoulder, which is attributed to arthritis. She states Tylenol  helps some. She also uses topical lidocaine  with improvement. She denies fevers or chills.  Her appetite is good. Her weight has increased 1 pounds over last 2 weeks .  REVIEW OF SYSTEMS:  Review of Systems  Constitutional:  Negative for appetite change, chills, diaphoresis, fatigue, fever and unexpected weight change.  HENT:   Negative for lump/mass, mouth sores, nosebleeds and sore throat.   Eyes:  Positive for eye problems (blurry vision, stable).  Respiratory:  Negative for cough, hemoptysis and shortness of breath.   Cardiovascular:  Negative for chest pain and leg swelling.  Gastrointestinal:  Negative for abdominal pain, blood in stool, constipation, diarrhea, nausea and vomiting.  Endocrine: Negative for hot flashes.  Genitourinary:  Negative for difficulty urinating, dysuria, frequency, hematuria and vaginal bleeding.   Musculoskeletal:  Negative for arthralgias, back pain, gait problem and myalgias.  Skin:  Negative for itching and rash.  Neurological:  Positive for light-headedness (occasional) and numbness (neuropathy hands and feet). Negative for dizziness, extremity weakness, gait problem and headaches.  Hematological:  Negative for adenopathy. Does not bruise/bleed easily.  Psychiatric/Behavioral:  Negative for depression and sleep disturbance. The patient is not nervous/anxious.      VITALS:  Blood pressure 129/63, pulse 91, temperature 98.2 F (36.8 C), temperature source Oral, weight 208 lb (94.3 kg), SpO2 97%.  Wt Readings from Last 3 Encounters:  03/01/23 208 lb (94.3 kg)  02/15/23 207 lb 1.6 oz (93.9 kg)  01/24/23 206 lb (93.4 kg)    Body mass index is 35.7 kg/m.  Performance status (ECOG): 1 - Symptomatic but completely ambulatory  PHYSICAL EXAM:  Physical Exam Vitals and nursing note  reviewed.   Constitutional:      General: She is not in acute distress.    Appearance: Normal appearance.  HENT:     Head: Normocephalic and atraumatic.     Mouth/Throat:     Mouth: Mucous membranes are moist.     Pharynx: Oropharynx is clear. No oropharyngeal exudate or posterior oropharyngeal erythema.  Eyes:     General: No scleral icterus.    Extraocular Movements: Extraocular movements intact.     Conjunctiva/sclera: Conjunctivae normal.     Right eye: Right conjunctiva is not injected.     Left eye: Left conjunctiva is not injected.     Pupils: Pupils are equal, round, and reactive to light.  Cardiovascular:     Rate and Rhythm: Normal rate and regular rhythm.     Heart sounds: Normal heart sounds. No murmur heard.    No friction rub. No gallop.  Pulmonary:     Effort: Pulmonary effort is normal.     Breath sounds: Normal breath sounds. No wheezing, rhonchi or rales.  Abdominal:     General: There is no distension.     Palpations: Abdomen is soft. There is no hepatomegaly, splenomegaly or mass.     Tenderness: There is no abdominal tenderness.  Musculoskeletal:        General: Normal range of motion.     Cervical back: Normal range of motion and neck supple. No tenderness.     Right lower leg: No edema.     Left lower leg: No edema.  Lymphadenopathy:     Cervical: No cervical adenopathy.     Upper Body:     Right upper body: No supraclavicular or axillary adenopathy.     Left upper body: No supraclavicular or axillary adenopathy.     Lower Body: No right inguinal adenopathy. No left inguinal adenopathy.  Skin:    General: Skin is warm and dry.     Coloration: Skin is not jaundiced.     Findings: No rash.  Neurological:     Mental Status: She is alert and oriented to person, place, and time.     Cranial Nerves: No cranial nerve deficit.  Psychiatric:        Mood and Affect: Mood normal.        Behavior: Behavior normal.        Thought Content: Thought content normal.      LABS:      Latest Ref Rng & Units 03/01/2023   10:17 AM 02/15/2023    8:55 AM 01/24/2023    8:11 AM  CBC  WBC 4.0 - 10.5 K/uL 7.7  5.9  6.2   Hemoglobin 12.0 - 15.0 g/dL 87.3  87.6  88.0   Hematocrit 36.0 - 46.0 % 36.0  35.3  33.9   Platelets 150 - 400 K/uL 186  186  153       Latest Ref Rng & Units 03/01/2023   10:17 AM 02/15/2023    8:55 AM 01/24/2023    8:11 AM  CMP  Glucose 70 - 99 mg/dL 889  816  842   BUN 8 - 23 mg/dL 21  24  22    Creatinine 0.44 - 1.00 mg/dL 8.80  8.55  8.66   Sodium 135 - 145 mmol/L 139  137  140   Potassium 3.5 - 5.1 mmol/L 4.2  4.1  3.8   Chloride 98 - 111 mmol/L 105  104  106   CO2 22 - 32 mmol/L 22  21  23   Calcium  8.9 - 10.3 mg/dL 9.9  9.4  9.7   Total Protein 6.5 - 8.1 g/dL 6.5  6.2  6.2   Total Bilirubin 0.0 - 1.2 mg/dL 0.4  0.5  0.6   Alkaline Phos 38 - 126 U/L 90  96  83   AST 15 - 41 U/L 21  21  23    ALT 0 - 44 U/L 22  23  20       Lab Results  Component Value Date   CEA1 2.9 09/10/2021   /  CEA  Date Value Ref Range Status  09/10/2021 2.9 0.0 - 4.7 ng/mL Final    Comment:    (NOTE)                             Nonsmokers          <3.9                             Smokers             <5.6 [employer] Electrochemiluminescence Immunoassay (ECLIA) Values obtained with different assay methods or kits cannot be used interchangeably.  Results cannot be interpreted as absolute evidence of the presence or absence of malignant disease. Performed At: [facility] [address] [place], [place] [id number] Jennette Shorter MD Ey:[phone]      STUDIES:  No results found.    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  If that happens, she is to call us  so that we can schedule her for an ultrasound to look for synovitis.     Essential hypertension 09/09/2016   Gastric cancer (HCC) 09/10/2021   Hyperlipidemia 09/01/2016   Hypertension    Hypothyroidism 09/01/2016   Osteoarthritis of both feet 01/20/2016   Osteoarthritis, hand 01/20/2016   Thyroid  disease     Past Surgical History:  Procedure Laterality Date   APPENDECTOMY     LAPAROSCOPIC APPENDECTOMY N/A 04/10/2016   Procedure: APPENDECTOMY LAPAROSCOPIC;  Surgeon: Vicenta Poli, MD;  Location: [facility] OR;  Service: General;  Laterality: N/A;    Family History  Problem Relation Age of Onset   Hypertension Mother    Prostate cancer Father        metastatic; d. 74   Brain cancer Sister 42   Breast cancer Sister 32   AAA (abdominal aortic aneurysm) Brother    Leukemia Cousin        x2 maternal female cousins; d. before 35   Breast cancer Daughter 70       DCIS    Social History:  reports that she has never smoked. She has never used smokeless tobacco. She reports that she does not currently use alcohol . She reports that she does not use drugs.The patient is accompanied by her daughter today.  Allergies:  Allergies  Allergen Reactions   Doxycycline Rash   Sulfa Antibiotics Rash and Other (See Comments)    Other reaction(s): Other (See Comments)  Made me feel weird    Current Medications: Current Outpatient Medications  Medication Sig Dispense Refill   aspirin 81 MG EC tablet Take 81 mg by mouth daily. Swallow whole.     b complex vitamins capsule Take 1 capsule by mouth daily.     Calcium  Carbonate (CALCIUM  600 PO) Take 1 tablet by mouth daily.      Cholecalciferol (VITAMIN D3) 5000 units CAPS Take 1 capsule by mouth daily.     EXFORGE HCT 5-160-12.5 MG TABS Take 1 tablet by mouth daily.     famotidine (PEPCID) 40 MG tablet Take 40 mg by mouth at bedtime.     gabapentin  (NEURONTIN ) 100 MG capsule Take 1 capsule (100 mg total) by mouth at bedtime. For 2 weeks, then 2 capsules by mouth at bedtime (Patient taking differently: Take 100 mg by mouth at bedtime. For 2 weeks, then 2 capsules by mouth at bedtime Currently taking one at bedtime) 50 capsule 5   KRILL OIL PO Take 1 capsule by mouth daily. Unknown strenght     Lactobacillus TABS Take 1 tablet by mouth 2 (two) times daily.     levothyroxine  (SYNTHROID , LEVOTHROID) 75 MCG tablet Take 75 mcg by mouth daily before breakfast.      LORazepam  (ATIVAN ) 0.5 MG tablet Take 1 tablet (0.5 mg total) by mouth every 8 (eight) hours. 30 tablet 0   NON FORMULARY Take 1 Dose by mouth See admin instructions. MMW: 3 parts Maalox 2 parts Benadryl  1 part viscious lidicaine  Disp. 6oz  Instructions: 5ml swish and swallow every 3-4 hours     omeprazole  (PRILOSEC) 40 MG capsule Take 1 capsule (40 mg total) by mouth 2 (two) times daily. 60 capsule 5   ondansetron  (ZOFRAN -ODT) 4 MG disintegrating tablet Take 1 tablet (4 mg total) by mouth every 8 (eight) hours as needed for nausea or vomiting. 90 tablet 0   polyethylene glycol powder (GLYCOLAX /MIRALAX ) 17 GM/SCOOP powder Take 1 Container by mouth daily.     potassium chloride  SA (KLOR-CON  M) 20 MEQ tablet Take 1 tablet (20 mEq total) by mouth 2 (two) times daily. 180 tablet 1   pravastatin  (PRAVACHOL ) 20 MG tablet Take 1 tablet (20 mg total) by mouth every evening. 90 tablet 3   Probiotic Product (PROBIOTIC DAILY PO) Take 1 tablet by mouth daily.     prochlorperazine  (COMPAZINE ) 10 MG tablet Take 1 tablet (10 mg total) by mouth every 6 (six) hours as needed for nausea or vomiting. 90 tablet 3   triamcinolone  0.1% oint-Eucerin equivalent cream 1:1 mixture Apply  topically 2 (two) times daily. (Patient not taking: Reported on 01/24/2023) 160 g 0   No current facility-administered medications for this visit.   Facility-Administered Medications Ordered in Other Visits  Medication Dose Route Frequency Provider Last Rate Last Admin   fluorouracil  (ADRUCIL ) 3,850 mg in sodium chloride  0.9 % 73 mL chemo infusion  1,960 mg/m2 (Order-Specific) Intravenous 1 day or 1 dose Cornelius Wanda DEL, MD   3,850 mg at 03/01/23 1413   heparin  lock flush 100 unit/mL  500 Units Intracatheter Once PRN Cornelius Wanda DEL, MD       sodium chloride  flush (NS) 0.9 % injection 10 mL  10 mL Intracatheter PRN Cornelius Wanda DEL, MD

## 2023-03-01 NOTE — Progress Notes (Signed)
 Per Harvin Hazel, PA ok to proceed with normal saline infusion @250ml /hr until CMP results.

## 2023-03-01 NOTE — Assessment & Plan Note (Signed)
 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  She has had chronic kidney disease since that episode.  Her creatinine is improved.  We have continued to give her IV fluids the week of her chemotherapy.

## 2023-03-01 NOTE — Progress Notes (Signed)
 Alteplase via port per Eugenia Mcalpine- Lab contacted for peripheral draw.

## 2023-03-01 NOTE — Patient Instructions (Signed)
 [facility] [place] [facility]  Discharge Instructions: Thank you for choosing [facility] to provide your oncology and hematology care.  If you have a lab appointment with the Cancer Center, please go directly to the Cancer Center and check in at the registration area.   Wear comfortable clothing and clothing appropriate for easy access to any Portacath or PICC line.   We strive to give you quality time with your provider. You may need to reschedule your appointment if you arrive late (15 or more minutes).  Arriving late affects you and other patients whose appointments are after yours.  Also, if you miss three or more appointments without notifying the office, you may be dismissed from the clinic at the provider's discretion.      For prescription refill requests, have your pharmacy contact our office and allow 72 hours for refills to be completed.    Today you received the following chemotherapy and/or immunotherapy agents Opdivo, leucovorin, flurouracil       To help prevent nausea and vomiting after your treatment, we encourage you to take your nausea medication as directed.  BELOW ARE SYMPTOMS THAT SHOULD BE REPORTED IMMEDIATELY: *FEVER GREATER THAN 100.4 F (38 C) OR HIGHER *CHILLS OR SWEATING *NAUSEA AND VOMITING THAT IS NOT CONTROLLED WITH YOUR NAUSEA MEDICATION *UNUSUAL SHORTNESS OF BREATH *UNUSUAL BRUISING OR BLEEDING *URINARY PROBLEMS (pain or burning when urinating, or frequent urination) *BOWEL PROBLEMS (unusual diarrhea, constipation, pain near the anus) TENDERNESS IN MOUTH AND THROAT WITH OR WITHOUT PRESENCE OF ULCERS (sore throat, sores in mouth, or a toothache) UNUSUAL RASH, SWELLING OR PAIN  UNUSUAL VAGINAL DISCHARGE OR ITCHING   Items with * indicate a potential emergency and should be followed up as soon as possible or go to the Emergency Department if any problems should occur.  Please show the CHEMOTHERAPY ALERT CARD  or IMMUNOTHERAPY ALERT CARD at check-in to the Emergency Department and triage nurse.  Should you have questions after your visit or need to cancel or reschedule your appointment, please contact [facility] [place] - [facility]  Dept: [phone]  and follow the prompts.  Office hours are 8:00 a.m. to 4:30 p.m. Monday - Friday. Please note that voicemails left after 4:00 p.m. may not be returned until the following business day.  We are closed weekends and major holidays. You have access to a nurse at all times for urgent questions. Please call the main number to the clinic Dept: [phone] and follow the prompts.  For any non-urgent questions, you may also contact your provider using MyChart. We now offer e-Visits for anyone 27 and older to request care online for non-urgent symptoms. For details visit [email or web address].   Also download the MyChart app! Go to the app store, search "MyChart", open the app, select [place], and log in with your MyChart username and password.

## 2023-03-02 ENCOUNTER — Other Ambulatory Visit: Payer: Self-pay

## 2023-03-02 LAB — T4: T4, Total: 10.2 ug/dL (ref 4.5–12.0)

## 2023-03-03 ENCOUNTER — Inpatient Hospital Stay: Payer: Medicare Other

## 2023-03-03 VITALS — BP 127/48 | HR 86 | Temp 98.2°F | Resp 18 | Ht 64.0 in | Wt 210.5 lb

## 2023-03-03 DIAGNOSIS — C168 Malignant neoplasm of overlapping sites of stomach: Secondary | ICD-10-CM

## 2023-03-03 DIAGNOSIS — C169 Malignant neoplasm of stomach, unspecified: Secondary | ICD-10-CM | POA: Diagnosis not present

## 2023-03-03 DIAGNOSIS — J479 Bronchiectasis, uncomplicated: Secondary | ICD-10-CM | POA: Diagnosis not present

## 2023-03-03 DIAGNOSIS — C786 Secondary malignant neoplasm of retroperitoneum and peritoneum: Secondary | ICD-10-CM | POA: Diagnosis not present

## 2023-03-03 DIAGNOSIS — Z5111 Encounter for antineoplastic chemotherapy: Secondary | ICD-10-CM | POA: Diagnosis not present

## 2023-03-03 DIAGNOSIS — K828 Other specified diseases of gallbladder: Secondary | ICD-10-CM | POA: Diagnosis not present

## 2023-03-03 DIAGNOSIS — J8410 Pulmonary fibrosis, unspecified: Secondary | ICD-10-CM | POA: Diagnosis not present

## 2023-03-03 MED ORDER — SODIUM CHLORIDE 0.9% FLUSH
10.0000 mL | INTRAVENOUS | Status: DC | PRN
  Administered 2023-03-03: 10 mL

## 2023-03-03 MED ORDER — SODIUM CHLORIDE 0.9 % IV SOLN: Freq: Once | INTRAVENOUS | Status: AC

## 2023-03-03 MED ORDER — HEPARIN SOD (PORK) LOCK FLUSH 100 UNIT/ML IV SOLN
500.0000 [IU] | Freq: Once | INTRAVENOUS | Status: AC | PRN
  Administered 2023-03-03: 500 [IU]

## 2023-03-03 NOTE — Patient Instructions (Signed)
Managing Chemotherapy Side Effects, Adult Chemotherapy is a treatment that uses medicine to kill cancer cells. However, in addition to killing cancer cells, the medicines can also damage healthy cells. The damage to healthy cells can lead to side effects. The exact side effects depend on the specific medicines used. Most of the side effects of chemotherapy go away once treatment is finished. Until then, work closely with your health care providers and take an active role in managing your side effects. What are common side effects of chemotherapy? Increased risk of infection, bruising, or bleeding. Nausea and vomiting. Constipation or diarrhea. Loss of appetite. Hair loss. Mouth or throat sores. Tiredness (fatigue). Tingling, pain, or numbness in the hands and feet. Dry, sensitive, itchy, or sore skin. Sleep disturbances, such as excessive sleepiness. Confusion, anxiety, or mood swings. Memory changes. How to manage the side effects of chemotherapy Medicines Take over-the-counter and prescription medicines only as told by your health care provider. Talk with your health care provider before taking vitamins, herbs, supplements, or over-the-counter medicines. Some of these can interfere with chemotherapy. Activity Get plenty of rest. Get regular exercise by doing activities such as walking, gentle yoga, or tai chi. Return to your normal activities as told by your health care provider. Ask your health care provider what activities are safe for you. Eating and drinking  Talk to a dietitian about what you should eat and drink during cancer treatment. Drink enough fluid to keep your urine pale yellow. If you have side effects that affect eating, these tips may help: Eat smaller meals and snacks often. Drink high-nutrition and high-calorie shakes or supplements. Choose bland and soft foods that are easy to eat. Do not eat foods that are hot, spicy, or hard to swallow. Do not eat raw or  undercooked meat, eggs, or seafood. Always wash fresh fruits and vegetables well before eating them. Skin care If you have sore or itchy skin: Wear soft, comfortable clothing. Apply creams and ointments to your skin as told by your health care provider. If you lose your hair, consider wearing a wig, hat, or scarf to cover your head. You may want to have someone shave your head as you start to lose hair. During outdoor activities, protect your head and skin from the sun by using sunscreen with an SPF of 30 or higher or by wearing protective clothing and a hat. Meet with a hair and skin care specialist for makeup and skin care tips. Apply sunscreen to your scalp as told by your health care provider. General tips Learn as much as you can about your condition. If you are struggling emotionally, talk with a mental health care provider or join a support group. Keep all follow-up visits. This is important. How to prevent infection and bleeding Chemotherapy may lower your blood counts and put you at risk for infection and bleeding. Here are some ways to help prevent problems. Vaccines Talk to your health care provider about vaccines. You should not get any live vaccines, such as the polio, MMR, chickenpox, and shingles vaccines until your health care provider says that it is safe to do so. Do not be around people who have had live vaccines for as long as your health care provider recommends. Make sure you get a yearly flu shot. People who will be near you should also get a yearly flu shot. Social activity Stay away from crowded places where you could be exposed to germs. Do not be around people who may be sick or  people who have symptoms of a fever until they have been fever-free for at least 24 hours. Do not share food, cups, straws, or utensils with other people. Wear a mask when outside the home if your blood counts are low. Cleanliness  Wash your hands often for at least 20 seconds. Also make  sure that other members of your household wash their hands often. Brush your teeth twice daily using a soft toothbrush. Use mouth rinse only as told by your health care provider. Take a bath or shower daily unless your health care provider gives different instructions. General tips Take your temperature regularly, especially if you have chills or feel warm. Check with your health care provider: Before you travel. Before you have a dental procedure. Before you use a swimming pool, hot tub, or swim in a lake or ocean. If you get chemotherapy through an IV or port, check the site every day for signs of infection. Check for redness, swelling, pain, fluid, and warmth. Avoid activities that put you at risk for injury or bleeding. Use an electric razor to shave instead of a blade. Questions to ask your health care provider What are the most common side effects of my treatment? How will they affect my daily life? What can I do to manage them? What are some possible long-term side effects? What are possible complications? What support services are available? What number can I call with questions or concerns? Where to find support Cancer affects the entire family. Find out what family support resources are available from your cancer treatment center. For more support, turn to: Your cancer care team. Friends and family. Your religious community. Other people with cancer. Community-based or online support groups. Where to find more information National Cancer Institute: www.cancer.gov American Cancer Society: www.cancer.org Contact a health care provider if: You bleed or bruise more often. You notice blood in your urine or stool. You have any of these symptoms: A skin rash, or dry or itchy skin. A headache or stiff neck. Cold or flu symptoms. A cough. Persistent nausea or vomiting. Persistent diarrhea. Frequent urination, burning when passing urine, or foul-smelling urine. You cannot eat  because of mouth or throat pain. You are sad, confused, anxious, or depressed. Get help right away if: You have any of these symptoms: A fever or chills. Your health care provider should know about this right away. Redness, swelling, pain, fluid, or warmth near an IV site. Bleeding that you cannot stop. A seizure. You cannot swallow. You have chest pain. You have trouble breathing. A family member or caregiver should get help right away if you have a sudden or unusual change in behavior. These symptoms may be an emergency. Get help right away. Call 911. Do not wait to see if the symptoms will go away. Do not drive yourself to the hospital. Summary Chemotherapy is a treatment that uses medicine to kill cancer cells and can cause side effects. The specific side effects depend on the specific medicines used. Learn as much as you can about your condition. Ask about side effects to watch for and how to treat them. Seek out support and resources from others. Find out what family support resources are available from your cancer treatment center. Let your health care provider know if you notice any new, unusual, or worsening symptoms, especially fever or chills. This information is not intended to replace advice given to you by your health care provider. Make sure you discuss any questions you have with your health  care provider. Document Revised: 01/22/2021 Document Reviewed: 01/22/2021 Elsevier Patient Education  2024 [employer].

## 2023-03-04 DIAGNOSIS — C189 Malignant neoplasm of colon, unspecified: Secondary | ICD-10-CM | POA: Diagnosis not present

## 2023-03-07 ENCOUNTER — Encounter (HOSPITAL_BASED_OUTPATIENT_CLINIC_OR_DEPARTMENT_OTHER): Payer: Self-pay | Admitting: Radiology

## 2023-03-07 ENCOUNTER — Ambulatory Visit (HOSPITAL_BASED_OUTPATIENT_CLINIC_OR_DEPARTMENT_OTHER)
Admission: RE | Admit: 2023-03-07 | Discharge: 2023-03-07 | Disposition: A | Discharge status: Home / Self Care | Payer: Medicare Other | Source: Ambulatory Visit | Attending: Oncology | Admitting: Oncology

## 2023-03-07 DIAGNOSIS — Z1231 Encounter for screening mammogram for malignant neoplasm of breast: Secondary | ICD-10-CM

## 2023-03-07 DIAGNOSIS — Z1239 Encounter for other screening for malignant neoplasm of breast: Secondary | ICD-10-CM

## 2023-03-09 NOTE — Progress Notes (Signed)
[facility], [facility]  [address] [place],  [place]  [phone]  Clinic Day:  03/15/23  Referring physician: Philemon Kingdom, MD  ASSESSMENT & PLAN:  Assessment: Gastric cancer (HCC) Stage IVB (T4 N0 M1) poorly differentiated adenocarcinoma of the stomach with signet ring features and ulceration, metastatic to the omentum.  Stain for HER2 was negative.  PET scan revealed omental involvement, but no distant metastasis was seen. She was receiving palliative chemotherapy with FOLFOX/nivolumab (5-fluorouracil/leucovorin/oxaliplatin/nivolumab), which now consists of 5-fluorouracil/leucovorin/nivolumab.  Oxaliplatin was discontinued after 11 cycles.  PET in February 2024 revealed resolution of metabolic activity associated with the stomach.  Decrease in size of omental nodularity. No associated metabolic activity.   EGD in March revealed residual disease.   CT chest, abdomen and pelvis in July revealed stable disease with persistent mild gastric wall thickening and subtle adjacent omental nodularity.  CT chest, abdomen pelvis and October revealed persistent wall thickening along the distal stomach with the adjacent stranding and soft tissue thickening, no additional areas of peritoneal nodularity or developing lymph node enlargement, no liver lesion and fatty liver infiltration. Once again changes interstitial lung disease with fibrosis, bronchiectasis were seen, as well as stable slight asymmetric thickening along the gallbladder, but the gallbladder is under-distended.  Her disease remains stable with treatment but still persists, so we recommended we continue the maintenance treatment and repeat imaging in 3 months. She has had chronic kidney disease since developing immune mediated nephritis in February, which resolved with high-dose steroids and temporarily holding nivolumab.  She receives extra IV fluids with her infusion to prevent recurrent acute kidney injury.      Drug-induced interstitial nephritis History immune mediated nephritis from immunotherapy in February, which resolved with prednisone.  She has had chronic kidney disease since that episode.  Her creatinine is improved.  We have continued to give her IV fluids the week of her chemotherapy.     Plan:  Testing for Claudin18 mutation was positive, a drug has been approved which targets this specific mutation. This will be helpful in the future. She had a screening bilateral mammogram done on 03/07/2023 that was clear.  Her day 1 cycle 32 of FOLFOX and Nivolumab is scheduled today, and her day 3 pump D/C cycle 32 is scheduled on 03/17/2023. She has a WBC of 6.4, hemoglobin of 12.4, and her platelet count is 170,000. She has an elevated creatinine of 1.21 and the rest of her CMP is normal. Her TSH and T4 levels today are pending. Her last CT scan was done on 12/06/2022 and I will plan repeat scans for the end of February. [name] back in 2 weeks with CBC, CMP, TSH, T4, and plan CT chest, abdomen, and pelvis in 4 weeks. The patient understands the plans discussed today and is in agreement with them.  She knows to contact our office if she develops concerns prior to her next appointment.   I provided 18 minutes of face-to-face time during this encounter and > 50% was spent counseling as documented under my assessment and plan.    Dellia Beckwith, MD   [facility] - A DEPT OF [facility] [address] [place] [address] Dept: [phone] Dept Fax: [phone]   No orders of the defined types were placed in this encounter.   CHIEF COMPLAINT:  CC: Stage IVB stomach  Current Treatment:  Fluorouracil/leucovorin/nivolumab  HISTORY OF PRESENT ILLNESS:  KAYTELYNN SCRIPTER  is a 88 y.o. female with stage IVB (T4b, N0, M1) gastric cancer diagnosed in July 2023.  She was referred by Dr. Shara Blazing for assessment and management.  She had  noticed that she was having regurgitation when eating and had lost over 30 pounds.  An ultrasound was done, revealing hepatic steatosis, and led to an MRI scan in June, which revealed gastric wall thickening with confluent nodularity of the omentum anterior to the stomach measuring 3.5 cm consistent with metastatic tumor.  She also had low-grade edema and wall thickening extending into the duodenum from the stomach.  She was referred to Dr. Shara Blazing and he did an EGD in July.  This revealed a large ulceration measuring 1.2 cm along the greater curvature.  She also had diffusely edematous and erythematous wall with erosions of the antrum and stiff and friable mucosa with oozing of blood.  These findings extended to the gastric fundus as well.  Pathology revealed a poorly differentiated adenocarcinoma with signet ring features from the biopsies of the ulcer as well as the antrum and the fundus of the stomach.  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  She was placed on omeprazole.  Her test for H. pylori was negative.  She was referred to Dr. Hardin Negus for consideration of surgery, but he felt this was not resectable because of the extensive involvement.  We consider this extending to the duodenum and metastatic to the omentum.  PET scan confirmed these findings.  She wished to pursue systemic intravenous therapy.  CEA and CA 19-9 were normal. She had genetic testing and is CHEK2 positive. Testing for HER2 of her tumor was negative. Guardant testing did not reveal any clinically significant mutations.   She has been receiving palliative FOLFOX/nivolumab, which now consists of 5-fluorouracil/leucovorin/nivolumab.  Oxaliplatin was discontinued after 11 cycles.  She has tolerated this fairly well. PET in February revealed resolution of metabolic activity associated with the stomach. Decrease in size of omental nodularity without associated metabolic  activity. No evidence of new or progressive gastric carcinoma. New peripheral consolidation in the RIGHT lower lobe with moderate metabolic activity. Favor focus of atelectasis versus less likely pneumonia. EGD in March revealed persistent disease.  CT chest, abdomen and pelvis in July revealed stable disease with persistent mild gastric wall thickening and subtle adjacent omental nodularity. Testing for Claudin18 mutation was positive, a drug has been approved which targets this specific mutation. This will be helpful in the future.    Oncology History  Gastric cancer (HCC)  09/10/2021 Initial Diagnosis   Gastric cancer (HCC)   09/10/2021 Cancer Staging   Staging form: Stomach, AJCC 8th Edition - Clinical stage from 09/10/2021: Stage IVB (cT4b, cN0, cM1) - Signed by Dellia Beckwith, MD on 09/10/2021 Histopathologic type: Adenocarcinoma, NOS Stage prefix: Initial diagnosis Total positive nodes: 0 Histologic grade (G): G3 Histologic grading system: 3 grade system Sites of metastasis: Peritoneal surface Diagnostic confirmation: Positive histology PLUS positive immunophenotyping and/or positive genetic studies Specimen type: Endoscopy with Biopsy Staged by: Managing physician Carcinoembryonic antigen (CEA) (ng/mL): 2.8 Carbohydrate antigen 19-9 (CA 19-9) (U/mL): 4.9 HER2 status: Unknown Microsatellite instability (MSI): Unknown Tumor location in stomach: Other Clinical staging modalities: Biopsy, Endoscopy Stage used in treatment planning: Yes National guidelines used in treatment planning: Yes Type of national guideline used in treatment planning: NCCN   09/28/2021 - 10/14/2021 Chemotherapy   Patient is on Treatment Plan : GASTROESOPHAGEAL FOLFOX + Nivolumab q14d  09/28/2021 -  Chemotherapy   Patient is on Treatment Plan : GASTROESOPHAGEAL FOLFOX + Nivolumab q14d     12/09/2021 Genetic Testing   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   Report date is 12/09/2021.   The Multi-Cancer + RNA Panel offered by [facility] includes sequencing and/or deletion/duplication analysis of the following 84 genes:  AIP*, ALK, APC*, ATM*, AXIN2*, BAP1*, BARD1*, BLM*, BMPR1A*, BRCA1*, BRCA2*, BRIP1*, CASR, CDC73*, CDH1*, CDK4, CDKN1B*, CDKN1C*, CDKN2A, CEBPA, CHEK2*, CTNNA1*, DICER1*, DIS3L2*, EGFR, EPCAM, FH*, FLCN*, GATA2*, GPC3, GREM1, HOXB13, HRAS, KIT, MAX*, MEN1*, MET, MITF, MLH1*, MSH2*, MSH3*, MSH6*, MUTYH*, NBN*, NF1*, NF2*, NTHL1*, PALB2*, PDGFRA, PHOX2B, PMS2*, POLD1*, POLE*, POT1*, PRKAR1A*, PTCH1*, PTEN*, RAD50*, RAD51C*, RAD51D*, RB1*, RECQL4, RET, RUNX1*, SDHA*, SDHAF2*, SDHB*, SDHC*, SDHD*, SMAD4*, SMARCA4*, SMARCB1*, SMARCE1*, STK11*, SUFU*, TERC, TERT, TMEM127*, Tp53*, TSC1*, TSC2*, VHL*, WRN*, and WT1.  RNA analysis is performed for * genes.     INTERVAL HISTORY:  Izabelle is here today for repeat clinical assessment for her stage IVB stomach. She had genetic testing and is CHEK2 positive. Testing for HER 2 of her tumor was negative. Guardant testing did not reveal any clinically significant mutations. Patient states that she feels well and has no complaints of pain.  Testing for Claudin18 mutation was positive, a drug has been approved which targets this specific mutation. This will be helpful in the future. She had a screening bilateral mammogram done on 03/07/2023 that was clear.  Her day 1 cycle 32 of FOLFOX and Nivolumab is scheduled today and her day 3 pump D/C cycle 32 is scheduled on 03/17/2023. She has a WBC of 6.4, hemoglobin of 12.4, and her platelet count is 170,000. She has an elevated creatinine of 1.21 and the rest of her CMP is normal. Her TSH and T4 levels today are pending. Her last CT scan was done on 12/06/2022 and I will plan repeat scans for the end of February. [name] back in 2 weeks with CBC, CMP, TSH, T4, and plan CT chest, abdomen, and pelvis in 4 weeks. She denies signs of infection such as sore throat, sinus  drainage or urinary symptoms. She denies fevers or recurrent chills. She denies pain. She denies nausea, vomiting, chest pain, cough or dyspnea. Her appetite is good and her weight has decreased 1 pounds over last 2 weeks .This patient is accompanied in the office by her daughter.  REVIEW OF SYSTEMS:  Review of Systems  Constitutional: Negative.  Negative for appetite change, chills, diaphoresis, fatigue, fever and unexpected weight change.  HENT:  Negative.  Negative for hearing loss, lump/mass, mouth sores, nosebleeds, sore throat, tinnitus, trouble swallowing and voice change.   Eyes:  Positive for eye problems (blurry vision, stable).  Respiratory: Negative.  Negative for chest tightness, cough, hemoptysis, shortness of breath and wheezing.   Cardiovascular: Negative.  Negative for chest pain, leg swelling and palpitations.  Gastrointestinal: Negative.  Negative for abdominal distention, abdominal pain, blood in stool, constipation, diarrhea, nausea, rectal pain and vomiting.  Endocrine: Negative.  Negative for hot flashes.  Genitourinary: Negative.  Negative for bladder incontinence, difficulty urinating, dyspareunia, dysuria, frequency, hematuria, menstrual problem, nocturia, pelvic pain, vaginal bleeding and vaginal discharge.   Musculoskeletal:  Positive for arthralgias (arthritic pain). Negative for back pain, flank pain, gait problem, myalgias, neck pain and neck stiffness.  Skin: Negative.  Negative for itching, rash and wound.  Neurological:  Positive for light-headedness (occasional) and numbness (neuropathy hands and feet). Negative for dizziness, extremity  weakness, gait problem, headaches, seizures and speech difficulty.  Hematological: Negative.  Negative for adenopathy. Does not bruise/bleed easily.  Psychiatric/Behavioral: Negative.  Negative for confusion, decreased concentration, depression, sleep disturbance and suicidal ideas. The patient is not nervous/anxious.      VITALS:   Blood pressure (!) 154/68, pulse 85, temperature 97.9 F (36.6 C), temperature source Oral, resp. rate 18, height 5\' 4"  (1.626 m), weight 207 lb 12.8 oz (94.3 kg), SpO2 100%.  Wt Readings from Last 3 Encounters:  03/17/23 209 lb 8 oz (95 kg)  03/15/23 207 lb 12.8 oz (94.3 kg)  03/03/23 210 lb 8 oz (95.5 kg)    Body mass index is 35.67 kg/m.  Performance status (ECOG): 1 - Symptomatic but completely ambulatory  PHYSICAL EXAM:  Physical Exam Vitals and nursing note reviewed. Exam conducted with a chaperone present.  Constitutional:      General: She is not in acute distress.    Appearance: Normal appearance. She is normal weight. She is not ill-appearing, toxic-appearing or diaphoretic.  HENT:     Head: Normocephalic and atraumatic.     Right Ear: Tympanic membrane, ear canal and external ear normal. There is no impacted cerumen.     Left Ear: Tympanic membrane, ear canal and external ear normal. There is no impacted cerumen.     Nose: Nose normal. No congestion or rhinorrhea.     Mouth/Throat:     Mouth: Mucous membranes are moist.     Pharynx: Oropharynx is clear. No oropharyngeal exudate or posterior oropharyngeal erythema.  Eyes:     General: No scleral icterus.       Right eye: No discharge.        Left eye: No discharge.     Extraocular Movements: Extraocular movements intact.     Conjunctiva/sclera: Conjunctivae normal.     Right eye: Right conjunctiva is not injected.     Left eye: Left conjunctiva is not injected.     Pupils: Pupils are equal, round, and reactive to light.  Neck:     Vascular: No carotid bruit.  Cardiovascular:     Rate and Rhythm: Normal rate and regular rhythm.     Pulses: Normal pulses.     Heart sounds: Normal heart sounds. No murmur heard.    No friction rub. No gallop.  Pulmonary:     Effort: Pulmonary effort is normal. No respiratory distress.     Breath sounds: Normal breath sounds. No stridor. No wheezing, rhonchi or rales.  Chest:      Chest wall: No tenderness.  Abdominal:     General: Bowel sounds are normal. There is no distension.     Palpations: Abdomen is soft. There is no hepatomegaly, splenomegaly or mass.     Tenderness: There is no abdominal tenderness. There is no right CVA tenderness, left CVA tenderness, guarding or rebound.     Hernia: No hernia is present.  Musculoskeletal:        General: No swelling, tenderness, deformity or signs of injury. Normal range of motion.     Cervical back: Normal range of motion and neck supple. No rigidity or tenderness.     Right lower leg: No edema.     Left lower leg: No edema.  Lymphadenopathy:     Cervical: No cervical adenopathy.     Upper Body:     Right upper body: No supraclavicular or axillary adenopathy.     Left upper body: No supraclavicular or axillary adenopathy.  Lower Body: No right inguinal adenopathy. No left inguinal adenopathy.  Skin:    General: Skin is warm and dry.     Coloration: Skin is not jaundiced or pale.     Findings: No bruising, erythema, lesion or rash.  Neurological:     General: No focal deficit present.     Mental Status: She is alert and oriented to person, place, and time. Mental status is at baseline.     Cranial Nerves: No cranial nerve deficit.     Sensory: No sensory deficit.     Motor: No weakness.     Coordination: Coordination normal.     Gait: Gait normal.     Deep Tendon Reflexes: Reflexes normal.  Psychiatric:        Mood and Affect: Mood normal.        Behavior: Behavior normal.        Thought Content: Thought content normal.        Judgment: Judgment normal.     LABS:      Latest Ref Rng & Units 03/15/2023    8:15 AM 03/01/2023   10:17 AM 02/15/2023    8:55 AM  CBC  WBC 4.0 - 10.5 K/uL 6.4  7.7  5.9   Hemoglobin 12.0 - 15.0 g/dL 16.1  09.6  04.5   Hematocrit 36.0 - 46.0 % 35.3  36.0  35.3   Platelets 150 - 400 K/uL 170  186  186       Latest Ref Rng & Units 03/15/2023    8:15 AM 03/01/2023   10:17 AM  02/15/2023    8:55 AM  CMP  Glucose 70 - 99 mg/dL 409  811  914   BUN 8 - 23 mg/dL 20  21  24    Creatinine 0.44 - 1.00 mg/dL 7.82  9.56  2.13   Sodium 135 - 145 mmol/L 141  139  137   Potassium 3.5 - 5.1 mmol/L 4.2  4.2  4.1   Chloride 98 - 111 mmol/L 105  105  104   CO2 22 - 32 mmol/L 24  22  21    Calcium 8.9 - 10.3 mg/dL 9.7  9.9  9.4   Total Protein 6.5 - 8.1 g/dL 6.3  6.5  6.2   Total Bilirubin 0.0 - 1.2 mg/dL 0.4  0.4  0.5   Alkaline Phos 38 - 126 U/L 94  90  96   AST 15 - 41 U/L 23  21  21    ALT 0 - 44 U/L 20  22  23     Lab Results  Component Value Date   CEA1 2.9 09/10/2021   /  CEA  Date Value Ref Range Status  09/10/2021 2.9 0.0 - 4.7 ng/mL Final    Comment:    (NOTE)                             Nonsmokers          <3.9                             Smokers             <5.6 [employer] Electrochemiluminescence Immunoassay (ECLIA) Values obtained with different assay methods or kits cannot be used interchangeably.  Results cannot be interpreted as absolute evidence of the presence or absence of malignant disease. Performed At: [facility] [id number] [place], [place] [id number] Jolene Schimke MD ON:[id number]    Lab Results  Component Value Date   TSH 2.814 03/15/2023   T4TOTAL 9.8 03/15/2023    STUDIES:  MM 3D SCREENING MAMMOGRAM BILATERAL BREAST Result Date: 03/14/2023 CLINICAL DATA:  Screening. EXAM: DIGITAL SCREENING BILATERAL MAMMOGRAM WITH TOMOSYNTHESIS AND CAD TECHNIQUE: Bilateral screening digital craniocaudal and mediolateral oblique mammograms were obtained. Bilateral screening digital breast tomosynthesis was performed. The images were evaluated with computer-aided detection. COMPARISON:  Previous exam(s). ACR Breast Density Category b: There are scattered areas of fibroglandular density. FINDINGS: There are no findings suspicious for malignancy. IMPRESSION: No mammographic evidence of malignancy. A result letter of this screening  mammogram will be mailed directly to the patient. RECOMMENDATION: Screening mammogram in one year. (Code:SM-B-01Y) BI-RADS CATEGORY  1: Negative. Electronically Signed   By: Amie Portland M.D.   On: 03/14/2023 14:34     Exam: 03/07/2023 Screening Bilateral Mammogram  Impression: No mammographic evidence of malignancy.   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  If that happens, she is to call us so that we can schedule her for an ultrasound to look for synovitis.     Essential hypertension 09/09/2016   Gastric cancer (HCC) 09/10/2021   Hyperlipidemia 09/01/2016   Hypertension    Hypothyroidism 09/01/2016   Osteoarthritis of both feet 01/20/2016   Osteoarthritis, hand 01/20/2016   Thyroid disease     Past Surgical History:  Procedure Laterality Date   APPENDECTOMY     LAPAROSCOPIC APPENDECTOMY N/A 04/10/2016   Procedure: APPENDECTOMY LAPAROSCOPIC;  Surgeon: Abigail Miyamoto, MD;  Location: [facility] OR;  Service: General;  Laterality: N/A;    Family History  Problem Relation Age of Onset   Hypertension Mother    Prostate cancer Father        metastatic; d. 27   Brain cancer Sister 52   Breast cancer Sister 58   AAA (abdominal aortic aneurysm) Brother    Leukemia Cousin        x2 maternal female cousins; d. before 55   Breast cancer Daughter 21       DCIS    Social History:  reports that she has never smoked.  She has never used smokeless tobacco. She reports that she does not currently use alcohol. She reports that she does not use drugs.The patient is accompanied by her daughter today.  Allergies:  Allergies  Allergen Reactions   Doxycycline Rash   Sulfa Antibiotics Rash and Other (See Comments)    Other reaction(s): Other (See Comments)  "Made me feel weird"    Current Medications: Current Outpatient Medications  Medication Sig Dispense Refill   aspirin 81 MG EC tablet Take 81 mg by mouth daily. Swallow whole.     b complex vitamins capsule Take 1 capsule by mouth daily.     Calcium Carbonate (CALCIUM 600 PO) Take 1 tablet by mouth daily.     Cholecalciferol (VITAMIN D3) 5000 units  CAPS Take 1 capsule by mouth daily.     EXFORGE HCT 5-160-12.5 MG TABS Take 1 tablet by mouth daily.     famotidine (PEPCID) 40 MG tablet Take 40 mg by mouth at bedtime.     gabapentin (NEURONTIN) 100 MG capsule Take 1 capsule (100 mg total) by mouth at bedtime. For 2 weeks, then 2 capsules by mouth at bedtime (Patient taking differently: Take 100 mg by mouth at bedtime. For 2 weeks, then 2 capsules by mouth at bedtime Currently taking one at bedtime) 50 capsule 5   KRILL OIL PO Take 1 capsule by mouth daily. Unknown strenght     Lactobacillus TABS Take 1 tablet by mouth 2 (two) times daily.     levothyroxine (SYNTHROID, LEVOTHROID) 75 MCG tablet Take 75 mcg by mouth daily before breakfast.      LORazepam (ATIVAN) 0.5 MG tablet Take 1 tablet (0.5 mg total) by mouth every 8 (eight) hours. 30 tablet 0   NON FORMULARY Take 1 Dose by mouth See admin instructions. MMW: 3 parts Maalox 2 parts Benadryl 1 part viscious lidicaine  Disp. 6oz  Instructions: 5ml swish and swallow every 3-4 hours     omeprazole (PRILOSEC) 40 MG capsule Take 1 capsule (40 mg total) by mouth 2 (two) times daily. 60 capsule 5   ondansetron (ZOFRAN-ODT) 4 MG disintegrating tablet Take 1 tablet (4 mg total) by mouth every 8 (eight) hours as  needed for nausea or vomiting. 90 tablet 0   polyethylene glycol powder (GLYCOLAX/MIRALAX) 17 GM/SCOOP powder Take 1 Container by mouth daily.     potassium chloride SA (KLOR-CON M) 20 MEQ tablet Take 1 tablet (20 mEq total) by mouth 2 (two) times daily. 180 tablet 1   pravastatin (PRAVACHOL) 20 MG tablet Take 1 tablet (20 mg total) by mouth every evening. 90 tablet 3   Probiotic Product (PROBIOTIC DAILY PO) Take 1 tablet by mouth daily.     prochlorperazine (COMPAZINE) 10 MG tablet Take 1 tablet (10 mg total) by mouth every 6 (six) hours as needed for nausea or vomiting. 90 tablet 3   triamcinolone 0.1% oint-Eucerin equivalent cream 1:1 mixture Apply topically 2 (two) times daily. 160 g 0   No current facility-administered medications for this visit.     I,Jasmine M Lassiter,acting as a scribe for Dellia Beckwith, MD.,have documented all relevant documentation on the behalf of Dellia Beckwith, MD,as directed by  Dellia Beckwith, MD while in the presence of Dellia Beckwith, MD.

## 2023-03-10 ENCOUNTER — Ambulatory Visit (INDEPENDENT_AMBULATORY_CARE_PROVIDER_SITE_OTHER): Payer: Medicare Other | Admitting: Podiatry

## 2023-03-10 DIAGNOSIS — M79675 Pain in left toe(s): Secondary | ICD-10-CM | POA: Diagnosis not present

## 2023-03-10 DIAGNOSIS — B351 Tinea unguium: Secondary | ICD-10-CM

## 2023-03-10 DIAGNOSIS — M79674 Pain in right toe(s): Secondary | ICD-10-CM | POA: Diagnosis not present

## 2023-03-10 NOTE — Progress Notes (Signed)
Subjective:  Patient ID: Mackenzie Lewis, female    DOB: [birth year],  MRN: [id number]   Mackenzie Lewis presents to clinic today for:  Chief Complaint  Patient presents with   RFC    RFC not diabetic, takes ASA 81   Patient notes nails are thick, discolored, elongated and painful in shoegear when trying to ambulate.  She notes her chemotherapy has caused her neuropathy in her feet which is gradually getting worse.  She was offered medication but states that she already takes too much medication at this time so she has opted not to take any medication for the peripheral neuropathy  PCP is Mackenzie Kingdom, MD.  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  If that happens, she is to call us so that we can schedule her for an ultrasound to look for synovitis.     Essential hypertension 09/09/2016   Gastric cancer (HCC) 09/10/2021   Hyperlipidemia 09/01/2016   Hypertension    Hypothyroidism 09/01/2016   Osteoarthritis of both feet 01/20/2016   Osteoarthritis, hand 01/20/2016   Thyroid disease     Past Surgical History:  Procedure Laterality Date   APPENDECTOMY     LAPAROSCOPIC APPENDECTOMY N/A 04/10/2016   Procedure: APPENDECTOMY LAPAROSCOPIC;  Surgeon: Mackenzie Miyamoto, MD;  Location: [facility] OR;  Service: General;  Laterality: N/A;    Allergies  Allergen Reactions   Doxycycline Rash   Sulfa Antibiotics Rash and Other  (See Comments)    Other reaction(s): Other (See Comments)  "Made me feel weird"    Review of Systems: Negative except as noted in the HPI.  Objective:  Mackenzie Lewis is a pleasant 88 y.o. female in NAD. AAO x 3.  Vascular Examination: Capillary refill time is 3-5 seconds to toes bilateral. Palpable pedal pulses b/l LE. Digital hair present b/l.  Skin temperature gradient WNL b/l. No cyanosis noted b/l.  Mild edema bilateral legs and ankles  Dermatological Examination: Pedal skin with normal turgor, texture and tone b/l. No open wounds. No interdigital macerations b/l. Toenails x10 are 3mm thick, discolored, dystrophic with subungual debris. There is pain with compression of the nail plates.  They are elongated x10  Assessment/Plan: 1. Pain due to onychomycosis of toenails of both feet    The mycotic toenails were sharply debrided x10 with sterile nail nippers and a power debriding burr to decrease bulk/thickness and length.    Return in about 3 months (around 06/08/2023) for RFC.   Mackenzie Lewis, DPM, FACFAS [facility]     [address], [place] [address]                Office [phone]  Fax (  [phone] 

## 2023-03-11 ENCOUNTER — Other Ambulatory Visit: Payer: Self-pay

## 2023-03-15 ENCOUNTER — Other Ambulatory Visit: Payer: Self-pay | Admitting: Oncology

## 2023-03-15 ENCOUNTER — Telehealth: Payer: Self-pay | Admitting: Oncology

## 2023-03-15 ENCOUNTER — Inpatient Hospital Stay: Payer: Medicare Other

## 2023-03-15 ENCOUNTER — Inpatient Hospital Stay (HOSPITAL_BASED_OUTPATIENT_CLINIC_OR_DEPARTMENT_OTHER): Payer: Medicare Other | Admitting: Oncology

## 2023-03-15 VITALS — BP 154/68 | HR 85 | Temp 97.9°F | Resp 18 | Ht 64.0 in | Wt 207.8 lb

## 2023-03-15 DIAGNOSIS — C168 Malignant neoplasm of overlapping sites of stomach: Secondary | ICD-10-CM

## 2023-03-15 DIAGNOSIS — N142 Nephropathy induced by unspecified drug, medicament or biological substance: Secondary | ICD-10-CM

## 2023-03-15 DIAGNOSIS — C786 Secondary malignant neoplasm of retroperitoneum and peritoneum: Secondary | ICD-10-CM | POA: Diagnosis not present

## 2023-03-15 DIAGNOSIS — K828 Other specified diseases of gallbladder: Secondary | ICD-10-CM | POA: Diagnosis not present

## 2023-03-15 DIAGNOSIS — J8410 Pulmonary fibrosis, unspecified: Secondary | ICD-10-CM | POA: Diagnosis not present

## 2023-03-15 DIAGNOSIS — J479 Bronchiectasis, uncomplicated: Secondary | ICD-10-CM | POA: Diagnosis not present

## 2023-03-15 DIAGNOSIS — Z5111 Encounter for antineoplastic chemotherapy: Secondary | ICD-10-CM | POA: Diagnosis not present

## 2023-03-15 DIAGNOSIS — C169 Malignant neoplasm of stomach, unspecified: Secondary | ICD-10-CM | POA: Diagnosis not present

## 2023-03-15 LAB — CBC WITH DIFFERENTIAL (CANCER CENTER ONLY)
Abs Immature Granulocytes: 0.02 10*3/uL (ref 0.00–0.07)
Basophils Absolute: 0 10*3/uL (ref 0.0–0.1)
Basophils Relative: 1 %
Eosinophils Absolute: 0.3 10*3/uL (ref 0.0–0.5)
Eosinophils Relative: 5 %
HCT: 35.3 % — ABNORMAL LOW (ref 36.0–46.0)
Hemoglobin: 12.4 g/dL (ref 12.0–15.0)
Immature Granulocytes: 0 %
Immature Platelet Fraction: 1.5 % (ref 1.2–8.6)
Lymphocytes Relative: 30 %
Lymphs Abs: 1.9 10*3/uL (ref 0.7–4.0)
MCH: 31.2 pg (ref 26.0–34.0)
MCHC: 35.1 g/dL (ref 30.0–36.0)
MCV: 88.7 fL (ref 80.0–100.0)
Monocytes Absolute: 0.7 10*3/uL (ref 0.1–1.0)
Monocytes Relative: 10 %
Neutro Abs: 3.5 10*3/uL (ref 1.7–7.7)
Neutrophils Relative %: 54 %
Platelet Count: 170 10*3/uL (ref 150–400)
RBC: 3.98 MIL/uL (ref 3.87–5.11)
RDW: 14.1 % (ref 11.5–15.5)
WBC Count: 6.4 10*3/uL (ref 4.0–10.5)
nRBC: 0 % (ref 0.0–0.2)
nRBC: 0 /100{WBCs}

## 2023-03-15 LAB — CMP (CANCER CENTER ONLY)
ALT: 20 U/L (ref 0–44)
AST: 23 U/L (ref 15–41)
Albumin: 4.2 g/dL (ref 3.5–5.0)
Alkaline Phosphatase: 94 U/L (ref 38–126)
Anion gap: 11 (ref 5–15)
BUN: 20 mg/dL (ref 8–23)
CO2: 24 mmol/L (ref 22–32)
Calcium: 9.7 mg/dL (ref 8.9–10.3)
Chloride: 105 mmol/L (ref 98–111)
Creatinine: 1.21 mg/dL — ABNORMAL HIGH (ref 0.44–1.00)
GFR, Estimated: 44 mL/min — ABNORMAL LOW (ref >60)
Glucose, Bld: 138 mg/dL — ABNORMAL HIGH (ref 70–99)
Potassium: 4.2 mmol/L (ref 3.5–5.1)
Sodium: 141 mmol/L (ref 135–145)
Total Bilirubin: 0.4 mg/dL (ref 0.0–1.2)
Total Protein: 6.3 g/dL — ABNORMAL LOW (ref 6.5–8.1)

## 2023-03-15 LAB — TSH: TSH: 2.814 u[IU]/mL (ref 0.350–4.500)

## 2023-03-15 MED ORDER — DEXAMETHASONE SODIUM PHOSPHATE 10 MG/ML IJ SOLN
10.0000 mg | Freq: Once | INTRAMUSCULAR | Status: AC
Start: 2023-03-15 — End: 2023-03-15
  Administered 2023-03-15: 10 mg via INTRAVENOUS
  Filled 2023-03-15: qty 1

## 2023-03-15 MED ORDER — SODIUM CHLORIDE 0.9 % IV SOLN
1960.0000 mg/m2 | INTRAVENOUS | Status: DC
  Administered 2023-03-15: 3850 mg via INTRAVENOUS
  Filled 2023-03-15: qty 77

## 2023-03-15 MED ORDER — DEXTROSE 5 % IV SOLN: Freq: Once | INTRAVENOUS | Status: AC

## 2023-03-15 MED ORDER — SODIUM CHLORIDE 0.9 % IV SOLN: Freq: Once | INTRAVENOUS | Status: AC

## 2023-03-15 MED ORDER — FLUOROURACIL CHEMO INJECTION 2.5 GM/50ML
320.0000 mg/m2 | Freq: Once | INTRAVENOUS | Status: AC
  Administered 2023-03-15: 650 mg via INTRAVENOUS
  Filled 2023-03-15: qty 13

## 2023-03-15 MED ORDER — PALONOSETRON HCL INJECTION 0.25 MG/5ML
0.2500 mg | Freq: Once | INTRAVENOUS | Status: AC
  Administered 2023-03-15: 0.25 mg via INTRAVENOUS
  Filled 2023-03-15: qty 5

## 2023-03-15 MED ORDER — SODIUM CHLORIDE 0.9 % IV SOLN
240.0000 mg | Freq: Once | INTRAVENOUS | Status: AC
  Administered 2023-03-15: 240 mg via INTRAVENOUS
  Filled 2023-03-15: qty 24

## 2023-03-15 MED ORDER — LEUCOVORIN CALCIUM INJECTION 350 MG
320.0000 mg/m2 | Freq: Once | INTRAVENOUS | Status: AC
  Administered 2023-03-15: 630 mg via INTRAVENOUS
  Filled 2023-03-15: qty 31.5

## 2023-03-15 NOTE — Telephone Encounter (Signed)
Patient has been scheduled for follow-up visit per 03/15/23 LOS.  Pt given an appt calendar with date and time.

## 2023-03-15 NOTE — Patient Instructions (Signed)
[facility] [place] - A DEPT OF [facility]  Discharge Instructions: Thank you for choosing [facility] to provide your oncology and hematology care.  If you have a lab appointment with the Cancer Center, please go directly to the Cancer Center and check in at the registration area.   Wear comfortable clothing and clothing appropriate for easy access to any Portacath or PICC line.   We strive to give you quality time with your provider. You may need to reschedule your appointment if you arrive late (15 or more minutes).  Arriving late affects you and other patients whose appointments are after yours.  Also, if you miss three or more appointments without notifying the office, you may be dismissed from the clinic at the provider's discretion.      For prescription refill requests, have your pharmacy contact our office and allow 72 hours for refills to be completed.    Today you received the following chemotherapy and/or immunotherapy agents Opdivo, leucovorin, flurouracil       To help prevent nausea and vomiting after your treatment, we encourage you to take your nausea medication as directed.  BELOW ARE SYMPTOMS THAT SHOULD BE REPORTED IMMEDIATELY: *FEVER GREATER THAN 100.4 F (38 C) OR HIGHER *CHILLS OR SWEATING *NAUSEA AND VOMITING THAT IS NOT CONTROLLED WITH YOUR NAUSEA MEDICATION *UNUSUAL SHORTNESS OF BREATH *UNUSUAL BRUISING OR BLEEDING *URINARY PROBLEMS (pain or burning when urinating, or frequent urination) *BOWEL PROBLEMS (unusual diarrhea, constipation, pain near the anus) TENDERNESS IN MOUTH AND THROAT WITH OR WITHOUT PRESENCE OF ULCERS (sore throat, sores in mouth, or a toothache) UNUSUAL RASH, SWELLING OR PAIN  UNUSUAL VAGINAL DISCHARGE OR ITCHING   Items with * indicate a potential emergency and should be followed up as soon as possible or go to the Emergency Department if any problems should occur.  Please show the CHEMOTHERAPY ALERT CARD  or IMMUNOTHERAPY ALERT CARD at check-in to the Emergency Department and triage nurse.  Should you have questions after your visit or need to cancel or reschedule your appointment, please contact [facility] [place] - A DEPT OF [facility]  Dept: [phone]  and follow the prompts.  Office hours are 8:00 a.m. to 4:30 p.m. Monday - Friday. Please note that voicemails left after 4:00 p.m. may not be returned until the following business day.  We are closed weekends and major holidays. You have access to a nurse at all times for urgent questions. Please call the main number to the clinic Dept: [phone] and follow the prompts.  For any non-urgent questions, you may also contact your provider using MyChart. We now offer e-Visits for anyone 25 and older to request care online for non-urgent symptoms. For details visit [email or web address].   Also download the MyChart app! Go to the app store, search "MyChart", open the app, select [place], and log in with your MyChart username and password.

## 2023-03-16 LAB — T4: T4, Total: 9.8 ug/dL (ref 4.5–12.0)

## 2023-03-17 ENCOUNTER — Inpatient Hospital Stay: Payer: Medicare Other

## 2023-03-17 VITALS — BP 135/54 | HR 86 | Temp 97.9°F | Resp 18 | Ht 64.0 in | Wt 209.5 lb

## 2023-03-17 DIAGNOSIS — J479 Bronchiectasis, uncomplicated: Secondary | ICD-10-CM | POA: Diagnosis not present

## 2023-03-17 DIAGNOSIS — C169 Malignant neoplasm of stomach, unspecified: Secondary | ICD-10-CM | POA: Diagnosis not present

## 2023-03-17 DIAGNOSIS — K828 Other specified diseases of gallbladder: Secondary | ICD-10-CM | POA: Diagnosis not present

## 2023-03-17 DIAGNOSIS — Z5111 Encounter for antineoplastic chemotherapy: Secondary | ICD-10-CM | POA: Diagnosis not present

## 2023-03-17 DIAGNOSIS — C786 Secondary malignant neoplasm of retroperitoneum and peritoneum: Secondary | ICD-10-CM | POA: Diagnosis not present

## 2023-03-17 DIAGNOSIS — J8410 Pulmonary fibrosis, unspecified: Secondary | ICD-10-CM | POA: Diagnosis not present

## 2023-03-17 DIAGNOSIS — C168 Malignant neoplasm of overlapping sites of stomach: Secondary | ICD-10-CM

## 2023-03-17 MED ORDER — SODIUM CHLORIDE 0.9% FLUSH
10.0000 mL | INTRAVENOUS | Status: DC | PRN
  Administered 2023-03-17: 10 mL

## 2023-03-17 MED ORDER — SODIUM CHLORIDE 0.9 % IV SOLN: Freq: Once | INTRAVENOUS | Status: AC

## 2023-03-17 MED ORDER — HEPARIN SOD (PORK) LOCK FLUSH 100 UNIT/ML IV SOLN
500.0000 [IU] | Freq: Once | INTRAVENOUS | Status: AC | PRN
  Administered 2023-03-17: 500 [IU]

## 2023-03-17 NOTE — Patient Instructions (Signed)
Dehydration, Adult Dehydration is a condition in which there is not enough water or other fluids in the body. This happens when a person loses more fluids than they take in. Important organs cannot work right without the right amount of fluids. Any loss of fluids from the body can cause dehydration. Dehydration can be mild, worse, or very bad. It should be treated right away to keep it from getting very bad. What are the causes? Conditions that cause loss of water in the body. They include: Watery poop (diarrhea). Vomiting. Sweating a lot. Fever. Infection. Peeing (urinating) a lot. Not drinking enough fluids. Certain medicines, such as medicines that take extra fluid out of the body (diuretics). Lack of safe drinking water. Not being able to get enough water and food. What increases the risk? Having a long-term (chronic) illness that has not been treated the right way, such as: Diabetes. Heart disease. Kidney disease. Being 36 years of age or older. Having a disability. Living in a place that is high above the ground or sea (high in altitude). The thinner, drier air causes more fluid loss. Doing exercises that put stress on your body for a long time. Being active when in hot places. What are the signs or symptoms? Symptoms of dehydration depend on how bad it is. Mild or worse dehydration Thirst. Dry lips or dry mouth. Feeling dizzy or light-headed. Muscle cramps. Passing little pee or dark pee. Pee may be the color of tea. Headache. Very bad dehydration Changes in skin. Skin may: Be cold to the touch (clammy). Be blotchy or pale. Not go back to normal right after you pinch it and let it go. Little or no tears, pee, or sweat. Fast breathing. Low blood pressure. Weak pulse. Pulse that is more than 100 beats a minute when you are sitting still. Other changes, such as: Feeling very thirsty. Eyes that look hollow (sunken). Cold hands and feet. Being confused. Being very  tired (lethargic) or having trouble waking from sleep. Losing weight. Loss of consciousness. How is this treated? Treatment for this condition depends on how bad your dehydration is. Treatment should start right away. Do not wait until your condition gets very bad. Very bad dehydration is an emergency. You will need to go to a hospital. Mild or worse dehydration can be treated at home. You may be asked to: Drink more fluids. Drink an oral rehydration solution (ORS). This drink gives you the right amount of fluids, salts, and minerals (electrolytes). Very bad dehydration can be treated: With fluids through an IV tube. By correcting low levels of electrolytes in the body. By treating the problem that caused your dehydration. Follow these instructions at home: Oral rehydration solution If told by your doctor, drink an ORS: Make an ORS. Use instructions on the package. Start by drinking small amounts, about  cup (120 mL) every 5-10 minutes. Slowly drink more until you have had the amount that your doctor said to have.  Eating and drinking  Drink enough clear fluid to keep your pee pale yellow. If you were told to drink an ORS, finish the ORS first. Then, start slowly drinking other clear fluids. Drink fluids such as: Water. Do not drink only water. Doing that can make the salt (sodium) level in your body get too low. Water from ice chips you suck on. Fruit juice that you have added water to (diluted). Low-calorie sports drinks. Eat foods that have the right amounts of salts and minerals, such as bananas, oranges, potatoes,  tomatoes, or spinach. Do not drink alcohol. Avoid drinks that have caffeine or sugar. These include:: High-calorie sports drinks. Fruit juice that you did not add water to. Soda. Coffee or energy drinks. Avoid foods that are greasy or have a lot of fat or sugar. General instructions Take over-the-counter and prescription medicines only as told by your doctor. Do  not take sodium tablets. Doing that can make the salt level in your body get too high. Return to your normal activities as told by your doctor. Ask your doctor what activities are safe for you. Keep all follow-up visits. Your doctor may check and change your treatment. Contact a doctor if: You have pain in your belly (abdomen) and the pain: Gets worse. Stays in one place. You have a rash. You have a stiff neck. You get angry or annoyed more easily than normal. You are more tired or have a harder time waking than normal. You feel weak or dizzy. You feel very thirsty. Get help right away if: You have any symptoms of very bad dehydration. You vomit every time you eat or drink. Your vomiting gets worse, does not go away, or you vomit blood or green stuff. You are getting treatment, but symptoms are getting worse. You have a fever. You have a very bad headache. You have: Diarrhea that gets worse or does not go away. Blood in your poop (stool). This may cause poop to look black and tarry. No pee in 6-8 hours. Only a small amount of pee in 6-8 hours, and the pee is very dark. You have trouble breathing. These symptoms may be an emergency. Get help right away. Call 911. Do not wait to see if the symptoms will go away. Do not drive yourself to the hospital. This information is not intended to replace advice given to you by your health care provider. Make sure you discuss any questions you have with your health care provider. Document Revised: 08/31/2021 Document Reviewed: 08/31/2021 Elsevier Patient Education  2024 [employer].

## 2023-03-20 ENCOUNTER — Other Ambulatory Visit: Payer: Self-pay | Admitting: Pharmacist

## 2023-03-20 ENCOUNTER — Encounter: Payer: Self-pay | Admitting: Oncology

## 2023-03-29 ENCOUNTER — Inpatient Hospital Stay: Payer: Medicare Other

## 2023-03-29 ENCOUNTER — Inpatient Hospital Stay: Payer: Medicare Other | Admitting: Oncology

## 2023-03-29 ENCOUNTER — Ambulatory Visit (HOSPITAL_BASED_OUTPATIENT_CLINIC_OR_DEPARTMENT_OTHER)
Admission: RE | Admit: 2023-03-29 | Discharge: 2023-03-29 | Disposition: A | Discharge status: Home / Self Care | Payer: Medicare Other | Source: Ambulatory Visit | Attending: Hematology and Oncology | Admitting: Hematology and Oncology

## 2023-03-29 ENCOUNTER — Encounter: Payer: Self-pay | Admitting: Hematology and Oncology

## 2023-03-29 ENCOUNTER — Inpatient Hospital Stay: Payer: Medicare Other | Attending: Hematology and Oncology

## 2023-03-29 ENCOUNTER — Inpatient Hospital Stay (HOSPITAL_BASED_OUTPATIENT_CLINIC_OR_DEPARTMENT_OTHER): Payer: Medicare Other | Admitting: Hematology and Oncology

## 2023-03-29 VITALS — BP 135/72 | HR 92 | Temp 98.7°F | Resp 18 | Ht 64.0 in | Wt 207.1 lb

## 2023-03-29 DIAGNOSIS — N182 Chronic kidney disease, stage 2 (mild): Secondary | ICD-10-CM | POA: Diagnosis not present

## 2023-03-29 DIAGNOSIS — E785 Hyperlipidemia, unspecified: Secondary | ICD-10-CM | POA: Insufficient documentation

## 2023-03-29 DIAGNOSIS — M25561 Pain in right knee: Secondary | ICD-10-CM | POA: Diagnosis not present

## 2023-03-29 DIAGNOSIS — Z5111 Encounter for antineoplastic chemotherapy: Secondary | ICD-10-CM | POA: Diagnosis not present

## 2023-03-29 DIAGNOSIS — C168 Malignant neoplasm of overlapping sites of stomach: Secondary | ICD-10-CM

## 2023-03-29 DIAGNOSIS — C169 Malignant neoplasm of stomach, unspecified: Secondary | ICD-10-CM | POA: Diagnosis not present

## 2023-03-29 DIAGNOSIS — I129 Hypertensive chronic kidney disease with stage 1 through stage 4 chronic kidney disease, or unspecified chronic kidney disease: Secondary | ICD-10-CM | POA: Diagnosis not present

## 2023-03-29 DIAGNOSIS — N142 Nephropathy induced by unspecified drug, medicament or biological substance: Secondary | ICD-10-CM

## 2023-03-29 DIAGNOSIS — Z79899 Other long term (current) drug therapy: Secondary | ICD-10-CM | POA: Insufficient documentation

## 2023-03-29 DIAGNOSIS — E039 Hypothyroidism, unspecified: Secondary | ICD-10-CM | POA: Diagnosis not present

## 2023-03-29 DIAGNOSIS — M1711 Unilateral primary osteoarthritis, right knee: Secondary | ICD-10-CM | POA: Diagnosis not present

## 2023-03-29 LAB — CBC WITH DIFFERENTIAL (CANCER CENTER ONLY)
Abs Immature Granulocytes: 0.02 10*3/uL (ref 0.00–0.07)
Basophils Absolute: 0.1 10*3/uL (ref 0.0–0.1)
Basophils Relative: 1 %
Eosinophils Absolute: 0.3 10*3/uL (ref 0.0–0.5)
Eosinophils Relative: 4 %
HCT: 36.3 % (ref 36.0–46.0)
Hemoglobin: 12.6 g/dL (ref 12.0–15.0)
Immature Granulocytes: 0 %
Lymphocytes Relative: 27 %
Lymphs Abs: 1.9 10*3/uL (ref 0.7–4.0)
MCH: 30.1 pg (ref 26.0–34.0)
MCHC: 34.7 g/dL (ref 30.0–36.0)
MCV: 86.6 fL (ref 80.0–100.0)
Monocytes Absolute: 0.6 10*3/uL (ref 0.1–1.0)
Monocytes Relative: 9 %
Neutro Abs: 4.3 10*3/uL (ref 1.7–7.7)
Neutrophils Relative %: 59 %
Platelet Count: 162 10*3/uL (ref 150–400)
RBC: 4.19 MIL/uL (ref 3.87–5.11)
RDW: 14.6 % (ref 11.5–15.5)
WBC Count: 7.2 10*3/uL (ref 4.0–10.5)
nRBC: 0 % (ref 0.0–0.2)
nRBC: 0 /100{WBCs}

## 2023-03-29 LAB — TSH: TSH: 2.512 u[IU]/mL (ref 0.350–4.500)

## 2023-03-29 LAB — CMP (CANCER CENTER ONLY)
ALT: 19 U/L (ref 0–44)
AST: 22 U/L (ref 15–41)
Albumin: 4 g/dL (ref 3.5–5.0)
Alkaline Phosphatase: 100 U/L (ref 38–126)
Anion gap: 12 (ref 5–15)
BUN: 24 mg/dL — ABNORMAL HIGH (ref 8–23)
CO2: 19 mmol/L — ABNORMAL LOW (ref 22–32)
Calcium: 9.4 mg/dL (ref 8.9–10.3)
Chloride: 109 mmol/L (ref 98–111)
Creatinine: 1.38 mg/dL — ABNORMAL HIGH (ref 0.44–1.00)
GFR, Estimated: 37 mL/min — ABNORMAL LOW (ref >60)
Glucose, Bld: 172 mg/dL — ABNORMAL HIGH (ref 70–99)
Potassium: 4 mmol/L (ref 3.5–5.1)
Sodium: 139 mmol/L (ref 135–145)
Total Bilirubin: 0.4 mg/dL (ref 0.0–1.2)
Total Protein: 6.3 g/dL — ABNORMAL LOW (ref 6.5–8.1)

## 2023-03-29 MED ORDER — DEXAMETHASONE SODIUM PHOSPHATE 10 MG/ML IJ SOLN
10.0000 mg | Freq: Once | INTRAMUSCULAR | Status: AC
  Administered 2023-03-29: 10 mg via INTRAVENOUS
  Filled 2023-03-29: qty 1

## 2023-03-29 MED ORDER — PALONOSETRON HCL INJECTION 0.25 MG/5ML
0.2500 mg | Freq: Once | INTRAVENOUS | Status: AC
  Administered 2023-03-29: 0.25 mg via INTRAVENOUS
  Filled 2023-03-29: qty 5

## 2023-03-29 MED ORDER — SODIUM CHLORIDE 0.9 % IV SOLN
240.0000 mg | Freq: Once | INTRAVENOUS | Status: AC
  Administered 2023-03-29: 240 mg via INTRAVENOUS
  Filled 2023-03-29: qty 24

## 2023-03-29 MED ORDER — FLUOROURACIL CHEMO INJECTION 2.5 GM/50ML
320.0000 mg/m2 | Freq: Once | INTRAVENOUS | Status: AC
  Administered 2023-03-29: 650 mg via INTRAVENOUS
  Filled 2023-03-29: qty 13

## 2023-03-29 MED ORDER — SODIUM CHLORIDE 0.9 % IV SOLN
320.0000 mg/m2 | Freq: Once | INTRAVENOUS | Status: AC
  Administered 2023-03-29: 630 mg via INTRAVENOUS
  Filled 2023-03-29: qty 31.5

## 2023-03-29 MED ORDER — SODIUM CHLORIDE 0.9 % IV SOLN
Freq: Once | INTRAVENOUS | Status: AC
Start: 2023-03-29 — End: 2023-03-29

## 2023-03-29 MED ORDER — SODIUM CHLORIDE 0.9 % IV SOLN
1960.0000 mg/m2 | INTRAVENOUS | Status: DC
  Administered 2023-03-29: 3850 mg via INTRAVENOUS
  Filled 2023-03-29: qty 77

## 2023-03-29 MED ORDER — ALTEPLASE 2 MG IJ SOLR
2.0000 mg | Freq: Once | INTRAMUSCULAR | Status: AC | PRN
  Administered 2023-03-29: 2 mg
  Filled 2023-03-29: qty 2

## 2023-03-29 MED ORDER — DEXTROSE 5 % IV SOLN: Freq: Once | INTRAVENOUS | Status: AC

## 2023-03-29 NOTE — Assessment & Plan Note (Addendum)
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  Stain for HER2 was negative.  PET scan revealed omental involvement, but no distant metastasis was seen. She is receiving palliative chemotherapy with FOLFOX/nivolumab (5-fluorouracil/leucovorin/oxaliplatin/nivolumab), which now consists of 5-fluorouracil/leucovorin/nivolumab.  Oxaliplatin was discontinued after 11 cycles.  PET in February 2024 revealed resolution of metabolic activity associated with the stomach.  Decrease in size of omental nodularity. No associated metabolic activity.   EGD in March revealed residual disease.  CT chest, abdomen and pelvis in July revealed stable disease with persistent mild gastric wall thickening and subtle adjacent omental nodularity.  CT chest, abdomen pelvis and October revealed persistent wall thickening along the distal stomach with the adjacent stranding and soft tissue thickening, no additional areas of peritoneal nodularity or developing lymph node enlargement, no liver lesion and fatty liver infiltration. Once again changes interstitial lung disease with fibrosis, bronchiectasis were seen, as well as stable slight asymmetric thickening along the gallbladder, but the gallbladder is under-distended.  Her disease remains stable with treatment but still persists, so we recommended we continue the maintenance treatment and repeat imaging in 3 months.   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  She receives extra IV fluids with her infusion to prevent recurrent acute kidney injury.  She otherwise continues to tolerate treatment well.  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   She will  proceed with 33rd cycle of 5-fluorouracil/leucovorin/nivolumab cycle today. We will plan to see her back in 2 weeks with a CBC, comprehensive metabolic panel, TSH and CT chest, abdomen and pelvis prior to a 34th cycle.

## 2023-03-29 NOTE — Progress Notes (Signed)
[facility]  [address] [place],  [place]  [address] [phone]  Clinic Day:  03/29/2023  Referring physician: Philemon Kingdom, MD  ASSESSMENT & PLAN:   Assessment & Plan: Gastric cancer (HCC) Stage IVB (T4 N0 M1) poorly differentiated adenocarcinoma of the stomach with signet ring features and ulceration, metastatic to the omentum.  Stain for HER2 was negative.  PET scan revealed omental involvement, but no distant metastasis was seen. She is receiving palliative chemotherapy with FOLFOX/nivolumab (5-fluorouracil/leucovorin/oxaliplatin/nivolumab), which now consists of 5-fluorouracil/leucovorin/nivolumab.  Oxaliplatin was discontinued after 11 cycles.  PET in February 2024 revealed resolution of metabolic activity associated with the stomach.  Decrease in size of omental nodularity. No associated metabolic activity.   EGD in March revealed residual disease.  CT chest, abdomen and pelvis in July revealed stable disease with persistent mild gastric wall thickening and subtle adjacent omental nodularity.  CT chest, abdomen pelvis and October revealed persistent wall thickening along the distal stomach with the adjacent stranding and soft tissue thickening, no additional areas of peritoneal nodularity or developing lymph node enlargement, no liver lesion and fatty liver infiltration. Once again changes interstitial lung disease with fibrosis, bronchiectasis were seen, as well as stable slight asymmetric thickening along the gallbladder, but the gallbladder is under-distended.  Her disease remains stable with treatment but still persists, so we recommended we continue the maintenance treatment and repeat imaging in 3 months.   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  She receives extra IV fluids with her infusion to prevent recurrent acute kidney injury.  She  otherwise continues to tolerate treatment well.  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   She will proceed with 33rd cycle of 5-fluorouracil/leucovorin/nivolumab cycle today. We will plan to see her back in 2 weeks with a CBC, comprehensive metabolic panel, TSH and CT chest, abdomen and pelvis prior to a 34th cycle.  Drug-induced interstitial nephritis History immune mediated nephritis from immunotherapy in February, which resolved with prednisone.  She has had chronic kidney disease since that episode.  Her creatinine has increased again, but still in previous range.  We continue to give her IV fluids the week of her chemotherapy.    Right knee pain Recurrent acute right knee pain, likely degenerative in nature. No calf pain or tenderness, no leg swelling. Will obtain xray for further evaluation.   The patient understands the plans discussed today and is in agreement with them.  She knows to contact our office if she develops concerns prior to her next appointment.   I provided 30 minutes of face-to-face time during this encounter and > 50% was spent counseling as documented under my assessment and plan.    Adah Perl, PA-C  [facility] [place] - A DEPT OF [facility] [address] [place] [address] Dept: [phone] Dept Fax: [phone]   Orders Placed This Encounter  Procedures   DG Knee 1-2 Views Right    Standing Status:   Future    Number of Occurrences:   1    Expiration Date:   03/28/2024    Reason for Exam (SYMPTOM  OR DIAGNOSIS REQUIRED):   right knee    Preferred imaging location?:   [facility]  CHIEF COMPLAINT:  CC: Stage IV gastric cancer  Current Treatment: Fluorouracil/leucovorin/nivolumab  HISTORY OF PRESENT ILLNESS:   Oncology History  Gastric cancer (HCC)   09/10/2021 Initial Diagnosis   Gastric cancer (HCC)   09/10/2021 Cancer Staging   Staging form: Stomach, AJCC 8th Edition - Clinical stage from 09/10/2021: Stage IVB (cT4b, cN0, cM1) - Signed by Dellia Beckwith, MD on 09/10/2021 Histopathologic type: Adenocarcinoma, NOS Stage prefix: Initial diagnosis Total positive nodes: 0 Histologic grade (G): G3 Histologic grading system: 3 grade system Sites of metastasis: Peritoneal surface Diagnostic confirmation: Positive histology PLUS positive immunophenotyping and/or positive genetic studies Specimen type: Endoscopy with Biopsy Staged by: Managing physician Carcinoembryonic antigen (CEA) (ng/mL): 2.8 Carbohydrate antigen 19-9 (CA 19-9) (U/mL): 4.9 HER2 status: Unknown Microsatellite instability (MSI): Unknown Tumor location in stomach: Other Clinical staging modalities: Biopsy, Endoscopy Stage used in treatment planning: Yes National guidelines used in treatment planning: Yes Type of national guideline used in treatment planning: NCCN   09/28/2021 - 10/14/2021 Chemotherapy   Patient is on Treatment Plan : GASTROESOPHAGEAL FOLFOX + Nivolumab q14d     09/28/2021 -  Chemotherapy   Patient is on Treatment Plan : GASTROESOPHAGEAL FOLFOX + Nivolumab q14d     12/09/2021 Genetic Testing   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  Report date is 12/09/2021.   The Multi-Cancer + RNA Panel offered by [facility] includes sequencing and/or deletion/duplication analysis of the following 84 genes:  AIP*, ALK, APC*, ATM*, AXIN2*, BAP1*, BARD1*, BLM*, BMPR1A*, BRCA1*, BRCA2*, BRIP1*, CASR, CDC73*, CDH1*, CDK4, CDKN1B*, CDKN1C*, CDKN2A, CEBPA, CHEK2*, CTNNA1*, DICER1*, DIS3L2*, EGFR, EPCAM, FH*, FLCN*, GATA2*, GPC3, GREM1, HOXB13, HRAS, KIT, MAX*, MEN1*, MET, MITF, MLH1*, MSH2*, MSH3*, MSH6*, MUTYH*, NBN*, NF1*, NF2*, NTHL1*, PALB2*, PDGFRA, PHOX2B, PMS2*, POLD1*, POLE*, POT1*, PRKAR1A*, PTCH1*, PTEN*, RAD50*,  RAD51C*, RAD51D*, RB1*, RECQL4, RET, RUNX1*, SDHA*, SDHAF2*, SDHB*, SDHC*, SDHD*, SMAD4*, SMARCA4*, SMARCB1*, SMARCE1*, STK11*, SUFU*, TERC, TERT, TMEM127*, Tp53*, TSC1*, TSC2*, VHL*, WRN*, and WT1.  RNA analysis is performed for * genes.       INTERVAL HISTORY:  Mackenzie Lewis is here today for repeat clinical assessment prior to a 33rd cycle of fluorouracil/leucovorin/nivolumab.  She was out of her blood pressure medication for about a week, but has resumed. She reports chronic edema bilateral feet and ankles, which is worse later in the day and improves overnight.  She reports stable neuropathy bilateral legs, right greater than left.   She denies fevers or chills. She reports right knee pain, which she has had in the past, but not recently. She is using a brace, which helps. Her appetite is good. Her weight has been stable. Bilateral screening mammogram in January was negative.  REVIEW OF SYSTEMS:  Review of Systems  Constitutional:  Negative for appetite change, chills, fatigue, fever and unexpected weight change.  HENT:   Negative for lump/mass, mouth sores and sore throat.   Respiratory:  Negative for cough and shortness of breath.   Cardiovascular:  Negative for chest pain and leg swelling.  Gastrointestinal:  Negative for abdominal pain, constipation, diarrhea, nausea and vomiting.  Endocrine: Negative for hot flashes.  Genitourinary:  Negative for difficulty urinating, dysuria, frequency and hematuria.   Musculoskeletal:  Positive for arthralgias (right knee). Negative for back pain and myalgias.  Skin:  Negative for rash.  Neurological:  Negative for dizziness and headaches.  Hematological:  Negative for adenopathy. Does not bruise/bleed easily.  Psychiatric/Behavioral:  Negative for depression and sleep disturbance. The patient is not nervous/anxious.  VITALS:  Blood pressure 135/72, pulse 92, temperature 98.7 F (37.1 C), temperature source Oral, resp. rate 18, height 5\' 4"  (1.626  m), weight 207 lb 1.6 oz (93.9 kg), SpO2 95%.  Wt Readings from Last 3 Encounters:  03/29/23 207 lb 1.6 oz (93.9 kg)  03/17/23 209 lb 8 oz (95 kg)  03/15/23 207 lb 12.8 oz (94.3 kg)    Body mass index is 35.55 kg/m.  Performance status (ECOG): 1 - Symptomatic but completely ambulatory  PHYSICAL EXAM:  Physical Exam Vitals and nursing note reviewed.  Constitutional:      General: She is not in acute distress.    Appearance: Normal appearance.  HENT:     Head: Normocephalic and atraumatic.     Mouth/Throat:     Mouth: Mucous membranes are moist.     Pharynx: Oropharynx is clear. No oropharyngeal exudate or posterior oropharyngeal erythema.  Eyes:     General: No scleral icterus.    Extraocular Movements: Extraocular movements intact.     Conjunctiva/sclera: Conjunctivae normal.     Pupils: Pupils are equal, round, and reactive to light.  Cardiovascular:     Rate and Rhythm: Normal rate and regular rhythm.     Heart sounds: Normal heart sounds. No murmur heard.    No friction rub. No gallop.  Pulmonary:     Effort: Pulmonary effort is normal.     Breath sounds: Normal breath sounds. No wheezing, rhonchi or rales.  Abdominal:     General: There is no distension.     Palpations: Abdomen is soft. There is no hepatomegaly, splenomegaly or mass.     Tenderness: There is no abdominal tenderness.  Musculoskeletal:        General: Normal range of motion.     Cervical back: Normal range of motion and neck supple. No tenderness.     Right knee: No swelling. No tenderness.     Left knee: No swelling. No tenderness.     Right lower leg: No tenderness. No edema.     Left lower leg: No tenderness. No edema.     Right ankle: Swelling (trace) present.     Left ankle: Swelling (trace) present.  Lymphadenopathy:     Cervical: No cervical adenopathy.     Upper Body:     Right upper body: No supraclavicular or axillary adenopathy.     Left upper body: No supraclavicular or axillary  adenopathy.  Skin:    General: Skin is warm and dry.     Coloration: Skin is not jaundiced.     Findings: No rash.  Neurological:     Mental Status: She is alert and oriented to person, place, and time.     Cranial Nerves: No cranial nerve deficit.  Psychiatric:        Mood and Affect: Mood normal.        Behavior: Behavior normal.        Thought Content: Thought content normal.     LABS:      Latest Ref Rng & Units 03/29/2023    9:50 AM 03/15/2023    8:15 AM 03/01/2023   10:17 AM  CBC  WBC 4.0 - 10.5 K/uL 7.2  6.4  7.7   Hemoglobin 12.0 - 15.0 g/dL 16.1  09.6  04.5   Hematocrit 36.0 - 46.0 % 36.3  35.3  36.0   Platelets 150 - 400 K/uL 162  170  186       Latest Ref Rng & Units 03/29/2023  9:50 AM 03/15/2023    8:15 AM 03/01/2023   10:17 AM  CMP  Glucose 70 - 99 mg/dL 161  096  045   BUN 8 - 23 mg/dL 24  20  21    Creatinine 0.44 - 1.00 mg/dL 4.09  8.11  9.14   Sodium 135 - 145 mmol/L 139  141  139   Potassium 3.5 - 5.1 mmol/L 4.0  4.2  4.2   Chloride 98 - 111 mmol/L 109  105  105   CO2 22 - 32 mmol/L 19  24  22    Calcium 8.9 - 10.3 mg/dL 9.4  9.7  9.9   Total Protein 6.5 - 8.1 g/dL 6.3  6.3  6.5   Total Bilirubin 0.0 - 1.2 mg/dL 0.4  0.4  0.4   Alkaline Phos 38 - 126 U/L 100  94  90   AST 15 - 41 U/L 22  23  21    ALT 0 - 44 U/L 19  20  22       Lab Results  Component Value Date   CEA1 2.9 09/10/2021   /  CEA  Date Value Ref Range Status  09/10/2021 2.9 0.0 - 4.7 ng/mL Final    Comment:    (NOTE)                             Nonsmokers          <3.9                             Smokers             <5.6 [employer] Electrochemiluminescence Immunoassay (ECLIA) Values obtained with different assay methods or kits cannot be used interchangeably.  Results cannot be interpreted as absolute evidence of the presence or absence of malignant disease. Performed At: [facility] [address] [place], [place] [id number] Jolene Schimke MD YQ:[id number]       STUDIES:  MM 3D SCREENING MAMMOGRAM BILATERAL BREAST Result Date: 03/14/2023 CLINICAL DATA:  Screening. EXAM: DIGITAL SCREENING BILATERAL MAMMOGRAM WITH TOMOSYNTHESIS AND CAD TECHNIQUE: Bilateral screening digital craniocaudal and mediolateral oblique mammograms were obtained. Bilateral screening digital breast tomosynthesis was performed. The images were evaluated with computer-aided detection. COMPARISON:  Previous exam(s). ACR Breast Density Category b: There are scattered areas of fibroglandular density. FINDINGS: There are no findings suspicious for malignancy. IMPRESSION: No mammographic evidence of malignancy. A result letter of this screening mammogram will be mailed directly to the patient. RECOMMENDATION: Screening mammogram in one year. (Code:SM-B-01Y) BI-RADS CATEGORY  1: Negative. Electronically Signed   By: Amie Portland M.D.   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  If that happens, she is to call us so that we can schedule her for an ultrasound to look for synovitis.  Essential hypertension 09/09/2016   Gastric cancer (HCC) 09/10/2021   Hyperlipidemia 09/01/2016   Hypertension    Hypothyroidism 09/01/2016   Osteoarthritis of both feet 01/20/2016   Osteoarthritis, hand 01/20/2016   Thyroid disease     Past Surgical History:  Procedure Laterality Date   APPENDECTOMY      LAPAROSCOPIC APPENDECTOMY N/A 04/10/2016   Procedure: APPENDECTOMY LAPAROSCOPIC;  Surgeon: Abigail Miyamoto, MD;  Location: [facility] OR;  Service: General;  Laterality: N/A;    Family History  Problem Relation Age of Onset   Hypertension Mother    Prostate cancer Father        metastatic; d. 76   Brain cancer Sister 70   Breast cancer Sister 53   AAA (abdominal aortic aneurysm) Brother    Leukemia Cousin        x2 maternal female cousins; d. before 31   Breast cancer Daughter 17       DCIS    Social History:  reports that she has never smoked. She has never used smokeless tobacco. She reports that she does not currently use alcohol. She reports that she does not use drugs.The patient is alone today.  Allergies:  Allergies  Allergen Reactions   Doxycycline Rash   Sulfa Antibiotics Rash and Other (See Comments)    Other reaction(s): Other (See Comments)  "Made me feel weird"    Current Medications: Current Outpatient Medications  Medication Sig Dispense Refill   aspirin 81 MG EC tablet Take 81 mg by mouth daily. Swallow whole.     b complex vitamins capsule Take 1 capsule by mouth daily.     Calcium Carbonate (CALCIUM 600 PO) Take 1 tablet by mouth daily.     Cholecalciferol (VITAMIN D3) 5000 units CAPS Take 1 capsule by mouth daily.     EXFORGE HCT 5-160-12.5 MG TABS Take 1 tablet by mouth daily.     famotidine (PEPCID) 40 MG tablet Take 40 mg by mouth at bedtime.     gabapentin (NEURONTIN) 100 MG capsule Take 1 capsule (100 mg total) by mouth at bedtime. For 2 weeks, then 2 capsules by mouth at bedtime (Patient not taking: Reported on 03/29/2023) 50 capsule 5   KRILL OIL PO Take 1 capsule by mouth daily. Unknown strenght     Lactobacillus TABS Take 1 tablet by mouth 2 (two) times daily.     levothyroxine (SYNTHROID, LEVOTHROID) 75 MCG tablet Take 75 mcg by mouth daily before breakfast.      LORazepam (ATIVAN) 0.5 MG tablet Take 1 tablet (0.5 mg total) by mouth every 8 (eight)  hours. 30 tablet 0   NON FORMULARY Take 1 Dose by mouth See admin instructions. MMW: 3 parts Maalox 2 parts Benadryl 1 part viscious lidicaine  Disp. 6oz  Instructions: 5ml swish and swallow every 3-4 hours     omeprazole (PRILOSEC) 40 MG capsule Take 1 capsule (40 mg total) by mouth 2 (two) times daily. 60 capsule 5   ondansetron (ZOFRAN-ODT) 4 MG disintegrating tablet Take 1 tablet (4 mg total) by mouth every 8 (eight) hours as needed for nausea or vomiting. 90 tablet 0   polyethylene glycol powder (GLYCOLAX/MIRALAX) 17 GM/SCOOP powder Take 1 Container by mouth daily.     potassium chloride SA (KLOR-CON M) 20 MEQ tablet Take 1 tablet (20 mEq total) by mouth 2 (two) times daily. 180 tablet 1   pravastatin (PRAVACHOL) 20 MG tablet Take 1 tablet (20 mg total) by mouth every evening. 90 tablet 3  Probiotic Product (PROBIOTIC DAILY PO) Take 1 tablet by mouth daily.     prochlorperazine (COMPAZINE) 10 MG tablet Take 1 tablet (10 mg total) by mouth every 6 (six) hours as needed for nausea or vomiting. 90 tablet 3   triamcinolone 0.1% oint-Eucerin equivalent cream 1:1 mixture Apply topically 2 (two) times daily. (Patient not taking: Reported on 03/29/2023) 160 g 0   No current facility-administered medications for this visit.

## 2023-03-29 NOTE — Assessment & Plan Note (Addendum)
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  She has had chronic kidney disease since that episode.  Her creatinine has increased again, but still in previous range.  We continue to give her IV fluids the week of her chemotherapy.

## 2023-03-29 NOTE — Assessment & Plan Note (Signed)
Recurrent acute right knee pain, likely degenerative in nature. No calf pain or tenderness, no leg swelling. Will obtain xray for further evaluation.

## 2023-03-29 NOTE — Patient Instructions (Signed)
[facility] [place] [facility]  Discharge Instructions: Thank you for choosing [facility] to provide your oncology and hematology care.  If you have a lab appointment with the Cancer Center, please go directly to the Cancer Center and check in at the registration area.   Wear comfortable clothing and clothing appropriate for easy access to any Portacath or PICC line.   We strive to give you quality time with your provider. You may need to reschedule your appointment if you arrive late (15 or more minutes).  Arriving late affects you and other patients whose appointments are after yours.  Also, if you miss three or more appointments without notifying the office, you may be dismissed from the clinic at the provider's discretion.      For prescription refill requests, have your pharmacy contact our office and allow 72 hours for refills to be completed.    Today you received the following chemotherapy and/or immunotherapy agents Opdivo, leucovorin, flurouracil       To help prevent nausea and vomiting after your treatment, we encourage you to take your nausea medication as directed.  BELOW ARE SYMPTOMS THAT SHOULD BE REPORTED IMMEDIATELY: *FEVER GREATER THAN 100.4 F (38 C) OR HIGHER *CHILLS OR SWEATING *NAUSEA AND VOMITING THAT IS NOT CONTROLLED WITH YOUR NAUSEA MEDICATION *UNUSUAL SHORTNESS OF BREATH *UNUSUAL BRUISING OR BLEEDING *URINARY PROBLEMS (pain or burning when urinating, or frequent urination) *BOWEL PROBLEMS (unusual diarrhea, constipation, pain near the anus) TENDERNESS IN MOUTH AND THROAT WITH OR WITHOUT PRESENCE OF ULCERS (sore throat, sores in mouth, or a toothache) UNUSUAL RASH, SWELLING OR PAIN  UNUSUAL VAGINAL DISCHARGE OR ITCHING   Items with * indicate a potential emergency and should be followed up as soon as possible or go to the Emergency Department if any problems should occur.  Please show the CHEMOTHERAPY ALERT CARD  or IMMUNOTHERAPY ALERT CARD at check-in to the Emergency Department and triage nurse.  Should you have questions after your visit or need to cancel or reschedule your appointment, please contact [facility] [place] - [facility]  Dept: [phone]  and follow the prompts.  Office hours are 8:00 a.m. to 4:30 p.m. Monday - Friday. Please note that voicemails left after 4:00 p.m. may not be returned until the following business day.  We are closed weekends and major holidays. You have access to a nurse at all times for urgent questions. Please call the main number to the clinic Dept: [phone] and follow the prompts.  For any non-urgent questions, you may also contact your provider using MyChart. We now offer e-Visits for anyone 27 and older to request care online for non-urgent symptoms. For details visit [email or web address].   Also download the MyChart app! Go to the app store, search "MyChart", open the app, select [place], and log in with your MyChart username and password.

## 2023-03-30 LAB — T4: T4, Total: 11 ug/dL (ref 4.5–12.0)

## 2023-03-31 ENCOUNTER — Inpatient Hospital Stay: Payer: Medicare Other

## 2023-03-31 ENCOUNTER — Encounter: Payer: Self-pay | Admitting: Oncology

## 2023-03-31 VITALS — BP 126/59 | HR 86 | Temp 98.7°F | Resp 18 | Ht 64.0 in | Wt 208.1 lb

## 2023-03-31 DIAGNOSIS — I129 Hypertensive chronic kidney disease with stage 1 through stage 4 chronic kidney disease, or unspecified chronic kidney disease: Secondary | ICD-10-CM | POA: Diagnosis not present

## 2023-03-31 DIAGNOSIS — N182 Chronic kidney disease, stage 2 (mild): Secondary | ICD-10-CM | POA: Diagnosis not present

## 2023-03-31 DIAGNOSIS — Z5111 Encounter for antineoplastic chemotherapy: Secondary | ICD-10-CM | POA: Diagnosis not present

## 2023-03-31 DIAGNOSIS — C168 Malignant neoplasm of overlapping sites of stomach: Secondary | ICD-10-CM

## 2023-03-31 DIAGNOSIS — E039 Hypothyroidism, unspecified: Secondary | ICD-10-CM | POA: Diagnosis not present

## 2023-03-31 DIAGNOSIS — E785 Hyperlipidemia, unspecified: Secondary | ICD-10-CM | POA: Diagnosis not present

## 2023-03-31 DIAGNOSIS — C169 Malignant neoplasm of stomach, unspecified: Secondary | ICD-10-CM | POA: Diagnosis not present

## 2023-03-31 MED ORDER — HEPARIN SOD (PORK) LOCK FLUSH 100 UNIT/ML IV SOLN
500.0000 [IU] | Freq: Once | INTRAVENOUS | Status: AC | PRN
Start: 2023-03-31 — End: 2023-03-31
  Administered 2023-03-31: 500 [IU]

## 2023-03-31 MED ORDER — SODIUM CHLORIDE 0.9 % IV SOLN: Freq: Once | INTRAVENOUS | Status: AC

## 2023-03-31 MED ORDER — SODIUM CHLORIDE 0.9% FLUSH
10.0000 mL | INTRAVENOUS | Status: DC | PRN
  Administered 2023-03-31: 10 mL

## 2023-04-02 ENCOUNTER — Encounter: Payer: Self-pay | Admitting: Oncology

## 2023-04-07 ENCOUNTER — Ambulatory Visit (HOSPITAL_BASED_OUTPATIENT_CLINIC_OR_DEPARTMENT_OTHER)
Admission: RE | Admit: 2023-04-07 | Discharge: 2023-04-07 | Discharge status: Home / Self Care | Payer: Medicare Other | Source: Ambulatory Visit | Attending: Oncology | Admitting: Oncology

## 2023-04-07 ENCOUNTER — Inpatient Hospital Stay: Payer: Medicare Other

## 2023-04-07 ENCOUNTER — Other Ambulatory Visit (HOSPITAL_BASED_OUTPATIENT_CLINIC_OR_DEPARTMENT_OTHER): Payer: Medicare Other | Admitting: Radiology

## 2023-04-07 DIAGNOSIS — I129 Hypertensive chronic kidney disease with stage 1 through stage 4 chronic kidney disease, or unspecified chronic kidney disease: Secondary | ICD-10-CM | POA: Diagnosis not present

## 2023-04-07 DIAGNOSIS — C168 Malignant neoplasm of overlapping sites of stomach: Secondary | ICD-10-CM | POA: Diagnosis not present

## 2023-04-07 DIAGNOSIS — K760 Fatty (change of) liver, not elsewhere classified: Secondary | ICD-10-CM | POA: Diagnosis not present

## 2023-04-07 DIAGNOSIS — N182 Chronic kidney disease, stage 2 (mild): Secondary | ICD-10-CM | POA: Diagnosis not present

## 2023-04-07 DIAGNOSIS — I700 Atherosclerosis of aorta: Secondary | ICD-10-CM | POA: Diagnosis not present

## 2023-04-07 DIAGNOSIS — E785 Hyperlipidemia, unspecified: Secondary | ICD-10-CM | POA: Diagnosis not present

## 2023-04-07 DIAGNOSIS — C169 Malignant neoplasm of stomach, unspecified: Secondary | ICD-10-CM | POA: Diagnosis not present

## 2023-04-07 DIAGNOSIS — Z5111 Encounter for antineoplastic chemotherapy: Secondary | ICD-10-CM | POA: Diagnosis not present

## 2023-04-07 DIAGNOSIS — E039 Hypothyroidism, unspecified: Secondary | ICD-10-CM | POA: Diagnosis not present

## 2023-04-07 LAB — CMP (CANCER CENTER ONLY)
ALT: 14 U/L (ref 0–44)
AST: 19 U/L (ref 15–41)
Albumin: 4.1 g/dL (ref 3.5–5.0)
Alkaline Phosphatase: 94 U/L (ref 38–126)
Anion gap: 12 (ref 5–15)
BUN: 20 mg/dL (ref 8–23)
CO2: 22 mmol/L (ref 22–32)
Calcium: 9.7 mg/dL (ref 8.9–10.3)
Chloride: 105 mmol/L (ref 98–111)
Creatinine: 1.43 mg/dL — ABNORMAL HIGH (ref 0.44–1.00)
GFR, Estimated: 36 mL/min — ABNORMAL LOW (ref >60)
Glucose, Bld: 137 mg/dL — ABNORMAL HIGH (ref 70–99)
Potassium: 4 mmol/L (ref 3.5–5.1)
Sodium: 139 mmol/L (ref 135–145)
Total Bilirubin: 0.6 mg/dL (ref 0.0–1.2)
Total Protein: 6.5 g/dL (ref 6.5–8.1)

## 2023-04-07 LAB — CBC WITH DIFFERENTIAL (CANCER CENTER ONLY)
Abs Immature Granulocytes: 0.05 10*3/uL (ref 0.00–0.07)
Basophils Absolute: 0.1 10*3/uL (ref 0.0–0.1)
Basophils Relative: 1 %
Eosinophils Absolute: 0.4 10*3/uL (ref 0.0–0.5)
Eosinophils Relative: 6 %
HCT: 36.8 % (ref 36.0–46.0)
Hemoglobin: 12.9 g/dL (ref 12.0–15.0)
Immature Granulocytes: 1 %
Lymphocytes Relative: 33 %
Lymphs Abs: 2.7 10*3/uL (ref 0.7–4.0)
MCH: 30.4 pg (ref 26.0–34.0)
MCHC: 35.1 g/dL (ref 30.0–36.0)
MCV: 86.8 fL (ref 80.0–100.0)
Monocytes Absolute: 0.8 10*3/uL (ref 0.1–1.0)
Monocytes Relative: 10 %
Neutro Abs: 4.1 10*3/uL (ref 1.7–7.7)
Neutrophils Relative %: 49 %
Platelet Count: 188 10*3/uL (ref 150–400)
RBC: 4.24 MIL/uL (ref 3.87–5.11)
RDW: 14.3 % (ref 11.5–15.5)
WBC Count: 8.1 10*3/uL (ref 4.0–10.5)
nRBC: 0 % (ref 0.0–0.2)
nRBC: 0 /100{WBCs}

## 2023-04-07 LAB — TSH: TSH: 2.855 u[IU]/mL (ref 0.350–4.500)

## 2023-04-07 MED ORDER — HEPARIN SOD (PORK) LOCK FLUSH 100 UNIT/ML IV SOLN: 500.0000 [IU] | Freq: Once | INTRAVENOUS | Status: DC

## 2023-04-07 MED ORDER — IOHEXOL 300 MG/ML  SOLN
100.0000 mL | Freq: Once | INTRAMUSCULAR | Status: AC | PRN
  Administered 2023-04-07: 80 mL via INTRAVENOUS

## 2023-04-08 LAB — T4: T4, Total: 11 ug/dL (ref 4.5–12.0)

## 2023-04-08 NOTE — Progress Notes (Signed)
 [facility]  [address] [place],  [place]  [address] [phone]  Clinic Day:  04/12/23  Referring physician: Philemon Kingdom, MD  ASSESSMENT & PLAN:  Assessment: Gastric cancer (HCC) Stage IVB (T4 N0 M1) poorly differentiated adenocarcinoma of the stomach with signet ring features and ulceration, metastatic to the omentum. Stain for HER2 was negative. PET scan revealed omental involvement, but no distant metastasis was seen. She is receiving palliative chemotherapy with FOLFOX/nivolumab (5-fluorouracil/leucovorin/oxaliplatin/nivolumab), which now consists of 5-fluorouracil/leucovorin/nivolumab. Oxaliplatin was discontinued after 11 cycles. PET in February 2024 revealed resolution of metabolic activity associated with the stomach. Decrease in size of omental nodularity. No associated metabolic activity. EGD in March revealed residual disease.   CT chest, abdomen and pelvis in July, 2024 revealed stable disease with persistent mild gastric wall thickening and subtle adjacent omental nodularity.  CT chest, abdomen pelvis and October revealed persistent wall thickening along the distal stomach with the adjacent stranding and soft tissue thickening, no additional areas of peritoneal nodularity or developing lymph node enlargement. Once again changes of interstitial lung disease with fibrosis and bronchiectasis were seen.  Her disease remains stable with treatment but still persists, so we recommended we continue the maintenance treatment and repeat imaging in 3 months. She continues to tolerate treatment well. 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 CT chest, abdomen, and pelvis done on 04/07/2023 revealed similar mild distal gastric wall thickening with perigastric fat stranding/soft tissue thickening but no new suspicious finding, no evidence of metastatic  disease within the chest, abdomen, or pelvis, stable small solid pulmonary nodules in the left lung base measuring up to 6 mm which are favored to be benign and diffuse hepatic steatosis. She will proceed with her 34th cycle of 5-fluorouracil/leucovorin/nivolumab cycle today but we will change to every 3 weeks now.  Drug-induced interstitial nephritis History immune mediated nephritis from immunotherapy in February, which resolved with prednisone and holding the nivolumab. She has had chronic kidney disease since that episode. Her creatinine has increased again, but still within previous range. We continue to give her IV fluids the week of her chemotherapy and I will recheck her creatinine later this week to see if she will need IV fluids during the week between treatments since every 3 weeks now..    Right knee pain Recurrent acute right knee pain. No calf pain or tenderness, no leg swelling. X-ray of the right knee revealed mild degenerative changes with osteophyte formation.   Plan: She was seen on 03/29/2023 by [facility] for increased right knee pain and had a right knee x-ray which revealed mild degenerative changes. A bilateral screening mammogram done on 03/07/2023 was clear. She had a repeat CT chest, abdomen, and pelvis done on 04/07/2023 which revealed similar mild distal gastric wall thickening with perigastric fat stranding/soft tissue thickening but no new suspicious finding, no evidence of metastatic disease within the chest, abdomen, or pelvis, stable small solid pulmonary nodules in the left lung base measuring up to 6 mm which are favored to be benign and diffuse hepatic steatosis. Patient had labs done on 04/07/2023 and had a WBC of 8.1, hemoglobin of 12.9, and platelet count of 188,000. Her CMP was normal other than a elevated glucose of 137 and creatinine of 1.43. TSH level was normal at 2.855 and her T4 was also normal at 11.0. Day 1 cycle 34 of FOLFOX and Nivolumab is  scheduled on  04/12/2023. She will have a repeat BMP so that we can monitor her creatinine and decide if she will need IV fluids on the week between treatments, since we will now change to every 3 weeks. [name] back in 3 weeks with CBC, CMP, TSH, and T4. The patient understands the plans discussed today and is in agreement with them.  She knows to contact our office if she develops concerns prior to her next appointment.  I provided 25 minutes of face-to-face time during this encounter and > 50% was spent counseling as documented under my assessment and plan.   Dellia Beckwith, MD [facility] - A DEPT OF [facility] [address] [place] [address] Dept: [phone] Dept Fax: [phone]   No orders of the defined types were placed in this encounter.   CHIEF COMPLAINT:  CC: Stage IV gastric cancer  Current Treatment: Fluorouracil/leucovorin/nivolumab  HISTORY OF PRESENT ILLNESS:   Oncology History  Gastric cancer (HCC)  09/10/2021 Initial Diagnosis   Gastric cancer (HCC)   09/10/2021 Cancer Staging   Staging form: Stomach, AJCC 8th Edition - Clinical stage from 09/10/2021: Stage IVB (cT4b, cN0, cM1) - Signed by Dellia Beckwith, MD on 09/10/2021 Histopathologic type: Adenocarcinoma, NOS Stage prefix: Initial diagnosis Total positive nodes: 0 Histologic grade (G): G3 Histologic grading system: 3 grade system Sites of metastasis: Peritoneal surface Diagnostic confirmation: Positive histology PLUS positive immunophenotyping and/or positive genetic studies Specimen type: Endoscopy with Biopsy Staged by: Managing physician Carcinoembryonic antigen (CEA) (ng/mL): 2.8 Carbohydrate antigen 19-9 (CA 19-9) (U/mL): 4.9 HER2 status: Unknown Microsatellite instability (MSI): Unknown Tumor location in stomach: Other Clinical staging modalities: Biopsy, Endoscopy Stage used in treatment planning: Yes National guidelines used in  treatment planning: Yes Type of national guideline used in treatment planning: NCCN   09/28/2021 - 10/14/2021 Chemotherapy   Patient is on Treatment Plan : GASTROESOPHAGEAL FOLFOX + Nivolumab q14d     09/28/2021 -  Chemotherapy   Patient is on Treatment Plan : GASTROESOPHAGEAL FOLFOX + Nivolumab q21d (changed from q14d on 04/12/23)     12/09/2021 Genetic Testing   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  Report date is 12/09/2021.   The Multi-Cancer + RNA Panel offered by [facility] includes sequencing and/or deletion/duplication analysis of the following 84 genes:  AIP*, ALK, APC*, ATM*, AXIN2*, BAP1*, BARD1*, BLM*, BMPR1A*, BRCA1*, BRCA2*, BRIP1*, CASR, CDC73*, CDH1*, CDK4, CDKN1B*, CDKN1C*, CDKN2A, CEBPA, CHEK2*, CTNNA1*, DICER1*, DIS3L2*, EGFR, EPCAM, FH*, FLCN*, GATA2*, GPC3, GREM1, HOXB13, HRAS, KIT, MAX*, MEN1*, MET, MITF, MLH1*, MSH2*, MSH3*, MSH6*, MUTYH*, NBN*, NF1*, NF2*, NTHL1*, PALB2*, PDGFRA, PHOX2B, PMS2*, POLD1*, POLE*, POT1*, PRKAR1A*, PTCH1*, PTEN*, RAD50*, RAD51C*, RAD51D*, RB1*, RECQL4, RET, RUNX1*, SDHA*, SDHAF2*, SDHB*, SDHC*, SDHD*, SMAD4*, SMARCA4*, SMARCB1*, SMARCE1*, STK11*, SUFU*, TERC, TERT, TMEM127*, Tp53*, TSC1*, TSC2*, VHL*, WRN*, and WT1.  RNA analysis is performed for * genes.     INTERVAL HISTORY:  Shaunta is here today for repeat clinical assessment for her stage IV gastric cancer on maintenance chemotherapy. Patient states that she feels well but complains of fatigue. She was seen on 03/29/20[facility] by [facility] for increased right knee pain and had a right knee x-ray which revealed mild degenerative changes. A bilateral screening mammogram done on 03/07/2023 was clear. She had a repeat CT chest, abdomen, and pelvis done on 04/07/2023 which revealed similar mild distal gastric wall thickening with perigastric fat stranding/soft tissue thickening but no new suspicious  finding, no evidence of metastatic disease within the chest, abdomen,  or pelvis, stable small solid pulmonary nodules in the left lung base measuring up to 6 mm which are favored to be benign and diffuse hepatic steatosis. Patient had labs done on 04/07/2023 and had a WBC of 8.1, hemoglobin of 12.9, and platelet count of 188,000. Her CMP was normal other than a elevated glucose of 137 and creatinine of 1.43. TSH level was normal at 2.855 and her T4 was also normal at 11.0. Day 1 cycle 34 of FOLFOX and Nivolumab is scheduled on 04/12/2023 and she will have a repeat BMP so that we can monitor her creatinine and decide if she will need IV fluids on the week between treatments since we will change to every 3 weeks now. [name] back in 3 weeks with CBC, CMP, TSH, and T4.   She denies signs of infection such as sore throat, sinus drainage, cough, or urinary symptoms.  She denies fevers or recurrent chills. She denies nausea, vomiting, chest pain, dyspnea or cough. Her appetite is ok and her weight has increased 3 pounds over last 2 weeks . This patient is accompanied in the office by her daughter.   REVIEW OF SYSTEMS:  Review of Systems  Constitutional:  Positive for fatigue. Negative for appetite change, chills, diaphoresis, fever and unexpected weight change.  HENT:  Negative.  Negative for hearing loss, lump/mass, mouth sores, nosebleeds, sore throat, tinnitus, trouble swallowing and voice change.   Eyes: Negative.  Negative for eye problems and icterus.  Respiratory: Negative.  Negative for chest tightness, cough, hemoptysis, shortness of breath and wheezing.   Cardiovascular: Negative.  Negative for chest pain, leg swelling and palpitations.  Gastrointestinal: Negative.  Negative for abdominal distention, abdominal pain, blood in stool, constipation, diarrhea, nausea, rectal pain and vomiting.  Endocrine: Negative.  Negative for hot flashes.  Genitourinary: Negative.  Negative for bladder incontinence, difficulty urinating, dyspareunia, dysuria, frequency,  hematuria, menstrual problem, nocturia, pelvic pain, vaginal bleeding and vaginal discharge.   Musculoskeletal:  Positive for arthralgias (right knee). Negative for back pain, flank pain, gait problem, myalgias, neck pain and neck stiffness.  Skin: Negative.  Negative for itching, rash and wound.  Neurological:  Positive for numbness (neuropathy). Negative for dizziness, extremity weakness, gait problem, headaches, light-headedness, seizures and speech difficulty.  Hematological: Negative.  Negative for adenopathy. Does not bruise/bleed easily.  Psychiatric/Behavioral: Negative.  Negative for confusion, decreased concentration, depression, sleep disturbance and suicidal ideas. The patient is not nervous/anxious.    VITALS:  Blood pressure (!) 154/89, pulse 92, temperature 97.7 F (36.5 C), temperature source Oral, resp. rate 16, height 5\' 4"  (1.626 m), weight 210 lb 3.2 oz (95.3 kg), SpO2 97%.  Wt Readings from Last 3 Encounters:  04/12/23 210 lb 3.2 oz (95.3 kg)  03/31/23 208 lb 1.9 oz (94.4 kg)  03/29/23 207 lb 1.6 oz (93.9 kg)    Body mass index is 36.08 kg/m.  Performance status (ECOG): 1 - Symptomatic but completely ambulatory  PHYSICAL EXAM:  Physical Exam Vitals and nursing note reviewed. Exam conducted with a chaperone present.  Constitutional:      General: She is not in acute distress.    Appearance: Normal appearance. She is normal weight. She is not ill-appearing, toxic-appearing or diaphoretic.  HENT:     Head: Normocephalic and atraumatic.     Right Ear: Tympanic membrane, ear canal and external ear normal. There is no impacted cerumen.     Left Ear:  Tympanic membrane, ear canal and external ear normal. There is no impacted cerumen.     Nose: Nose normal. No congestion or rhinorrhea.     Mouth/Throat:     Mouth: Mucous membranes are moist.     Pharynx: Oropharynx is clear. No oropharyngeal exudate or posterior oropharyngeal erythema.  Eyes:     General: No scleral  icterus.       Right eye: No discharge.        Left eye: No discharge.     Extraocular Movements: Extraocular movements intact.     Conjunctiva/sclera: Conjunctivae normal.     Pupils: Pupils are equal, round, and reactive to light.  Neck:     Vascular: No carotid bruit.  Cardiovascular:     Rate and Rhythm: Normal rate and regular rhythm.     Pulses: Normal pulses.     Heart sounds: Normal heart sounds. No murmur heard.    No friction rub. No gallop.  Pulmonary:     Effort: Pulmonary effort is normal. No respiratory distress.     Breath sounds: Normal breath sounds. No stridor. No wheezing, rhonchi or rales.  Chest:     Chest wall: No tenderness.  Abdominal:     General: Bowel sounds are normal. There is no distension.     Palpations: Abdomen is soft. There is no hepatomegaly, splenomegaly or mass.     Tenderness: There is no abdominal tenderness. There is no right CVA tenderness, left CVA tenderness, guarding or rebound.     Hernia: No hernia is present.     Comments: On deep palpation there is some firmness in the epigastrium.   Musculoskeletal:        General: No tenderness, deformity or signs of injury. Normal range of motion.     Cervical back: Normal range of motion and neck supple. No rigidity or tenderness.     Right knee: No swelling. No tenderness.     Left knee: No swelling. No tenderness.     Right lower leg: Swelling (trace) present. No tenderness. No edema.     Left lower leg: Swelling (trace) present. No tenderness. No edema.     Right ankle: No swelling.     Left ankle: No swelling.  Lymphadenopathy:     Cervical: No cervical adenopathy.     Right cervical: No superficial, deep or posterior cervical adenopathy.    Left cervical: No superficial, deep or posterior cervical adenopathy.     Upper Body:     Right upper body: No supraclavicular, axillary or pectoral adenopathy.     Left upper body: No supraclavicular, axillary or pectoral adenopathy.  Skin:     General: Skin is warm and dry.     Coloration: Skin is not jaundiced or pale.     Findings: No bruising, erythema, lesion or rash.  Neurological:     General: No focal deficit present.     Mental Status: She is alert and oriented to person, place, and time. Mental status is at baseline.     Cranial Nerves: No cranial nerve deficit.     Sensory: No sensory deficit.     Motor: No weakness.     Coordination: Coordination normal.     Gait: Gait normal.     Deep Tendon Reflexes: Reflexes normal.  Psychiatric:        Mood and Affect: Mood normal.        Behavior: Behavior normal.        Thought Content: Thought content  normal.        Judgment: Judgment normal.    LABS:      Latest Ref Rng & Units 04/07/2023    9:43 AM 03/29/2023    9:50 AM 03/15/2023    8:15 AM  CBC  WBC 4.0 - 10.5 K/uL 8.1  7.2  6.4   Hemoglobin 12.0 - 15.0 g/dL 16.1  09.6  04.5   Hematocrit 36.0 - 46.0 % 36.8  36.3  35.3   Platelets 150 - 400 K/uL 188  162  170       Latest Ref Rng & Units 04/14/2023    9:39 AM 04/07/2023    9:43 AM 03/29/2023    9:50 AM  CMP  Glucose 70 - 99 mg/dL 409  811  914   BUN 8 - 23 mg/dL 28  20  24    Creatinine 0.44 - 1.00 mg/dL 7.82  9.56  2.13   Sodium 135 - 145 mmol/L 139  139  139   Potassium 3.5 - 5.1 mmol/L 4.1  4.0  4.0   Chloride 98 - 111 mmol/L 105  105  109   CO2 22 - 32 mmol/L 21  22  19    Calcium 8.9 - 10.3 mg/dL 9.3  9.7  9.4   Total Protein 6.5 - 8.1 g/dL  6.5  6.3   Total Bilirubin 0.0 - 1.2 mg/dL  0.6  0.4   Alkaline Phos 38 - 126 U/L  94  100   AST 15 - 41 U/L  19  22   ALT 0 - 44 U/L  14  19    Lab Results  Component Value Date   CEA1 2.9 09/10/2021   /  CEA  Date Value Ref Range Status  09/10/2021 2.9 0.0 - 4.7 ng/mL Final    Comment:    (NOTE)                             Nonsmokers          <3.9                             Smokers             <5.6 [employer] Electrochemiluminescence Immunoassay (ECLIA) Values obtained with different assay  methods or kits cannot be used interchangeably.  Results cannot be interpreted as absolute evidence of the presence or absence of malignant disease. Performed At: [facility] [address] [place], [place] [id number] Jolene Schimke MD GE:[id number]    Component Ref Range & Units (hover) 5 d ago (04/07/23) 2 wk ago (03/29/23) 4 wk ago (03/15/23) 1 mo ago (03/01/23) 1 mo ago (02/15/23) 2 mo ago (01/24/23) 3 mo ago (01/10/23)  T4, Total 11.0 11.0 CM 9.8 CM 10.2 CM 9.7 CM 10.9 CM 9.6 CM     Component Ref Range & Units (hover) 5 d ago (04/07/23) 2 wk ago (03/29/23) 4 wk ago (03/15/23) 1 mo ago (03/01/23) 1 mo ago (02/15/23) 2 mo ago (01/24/23) 3 mo ago (01/10/23)  TSH 2.855 2.512 CM 2.814 CM 1.586 CM 1.561 CM 1.983 CM 2.296 CM     STUDIES:  EXAM: 03/29/2023 RIGHT KNEE - 1-2 VIEW IMPRESSION: Mild degenerative changes of the right knee.  DG Knee 1-2 Views Right Result Date: 04/11/2023 CLINICAL DATA:  Knee pain EXAM: RIGHT KNEE - 1-2 VIEW COMPARISON:  None Available. FINDINGS: No  evidence of fracture, dislocation, or joint effusion. There is mild medial and lateral compartment joint space narrowing and osteophyte formation compatible with degenerative change. Soft tissues are unremarkable. IMPRESSION: Mild degenerative changes of the right knee. Electronically Signed   By: Darliss Cheney M.D.   On: 04/11/2023 01:26   CT CHEST ABDOMEN PELVIS W CONTRAST Result Date: 04/07/2023 CLINICAL DATA:  Gastric cancer, monitor. * Tracking Code: BO * EXAM: CT CHEST, ABDOMEN, AND PELVIS WITH CONTRAST TECHNIQUE: Multidetector CT imaging of the chest, abdomen and pelvis was performed following the standard protocol during bolus administration of intravenous contrast. RADIATION DOSE REDUCTION: This exam was performed according to the departmental dose-optimization program which includes automated exposure control, adjustment of the mA and/or kV according to patient size and/or use of iterative  reconstruction technique. CONTRAST:  80mL OMNIPAQUE IOHEXOL 300 MG/ML  SOLN COMPARISON:  Multiple priors including most recent CT December 06, 2022. FINDINGS: CT CHEST FINDINGS Cardiovascular: Central venous catheter with tip in the SVC. Aortic atherosclerosis. No central pulmonary embolus on this nondedicated study. Normal size heart. No significant pericardial effusion/thickening. Mediastinum/Nodes: Stable prominent mediastinal lymph nodes not pathologically enlarged by size criteria. No suspicious thyroid nodule. The esophagus is grossly unremarkable. Lungs/Pleura: Stable small solid pulmonary nodules in the left lung base for instance 2 on image 82/302 measuring 6 mm unchanged from prior. No new suspicious pulmonary nodules or masses. Subpleural reticulations similar prior compatible with interstitial lung disease. Musculoskeletal: No aggressive lytic or blastic lesion of bone. Multilevel degenerative change of the spine. CT ABDOMEN PELVIS FINDINGS Hepatobiliary: Diffuse hepatic steatosis. No suspicious hepatic lesion. Gallbladder is unremarkable. Similar prominence of the biliary tree with tapering to the level of the ampulla favored senescent change. Pancreas: No pancreatic ductal dilation or evidence of acute inflammation. Spleen: No splenomegaly. Adrenals/Urinary Tract: No suspicious adrenal nodule/mass. No hydronephrosis. Kidneys demonstrate symmetric enhancement. Urinary bladder is unremarkable for degree of distension. Stomach/Bowel: Similar mild gastric wall thickening. No new suspicious wall thickening or nodularity. No pathologic dilation of small or large bowel. Appendectomy. No evidence of acute bowel inflammation. Vascular/Lymphatic: Aortic atherosclerosis. Normal caliber abdominal aorta. Retroaortic left renal vein. Smooth IVC contours. No pathologically enlarged abdominal or pelvic lymph nodes. Reproductive: Status post hysterectomy. No adnexal masses. Other: Similar perigastric fat stranding. No  walled-off fluid collection. Musculoskeletal: No aggressive lytic or blastic lesion of bone. IMPRESSION: 1. Similar mild distal gastric wall thickening with perigastric fat stranding/soft tissue thickening. No new suspicious finding. 2. No evidence of metastatic disease within the chest, abdomen, or pelvis. 3. Stable small solid pulmonary nodules in the left lung base measuring up to 6 mm, favored benign. 4. Diffuse hepatic steatosis. 5.  Aortic Atherosclerosis (ICD10-I70.0). Electronically Signed   By: Maudry Mayhew M.D.   On: 04/07/2023 15:24    EXAM: 03/07/2023 DIGITAL SCREENING BILATERAL MAMMOGRAM WITH TOMOSYNTHESIS AND CAD IMPRESSION: No mammographic evidence of malignancy. A result letter of this screening mammogram will be mailed directly to the patient.  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  If that happens, she is to call us so that we can schedule her for an ultrasound to look for synovitis.     Essential hypertension 09/09/2016   Gastric cancer (HCC) 09/10/2021   Hyperlipidemia 09/01/2016   Hypertension    Hypothyroidism 09/01/2016   Osteoarthritis of both feet 01/20/2016   Osteoarthritis, hand 01/20/2016   Thyroid disease     Past Surgical History:  Procedure Laterality Date   APPENDECTOMY     LAPAROSCOPIC APPENDECTOMY N/A 04/10/2016   Procedure: APPENDECTOMY LAPAROSCOPIC;  Surgeon: Abigail Miyamoto,  MD;  Location: [facility] OR;  Service: General;  Laterality: N/A;    Family History  Problem Relation Age of Onset   Hypertension Mother    Prostate cancer Father        metastatic; d. 80   Brain cancer Sister 18   Breast cancer Sister 71   AAA (abdominal aortic aneurysm) Brother    Leukemia Cousin        x2 maternal female cousins; d. before 83   Breast cancer Daughter 39       DCIS    Social History:  reports that she has never smoked. She has never used smokeless tobacco. She reports that she does not currently use alcohol. She reports that she does not use drugs.The patient is alone today.  Allergies:  Allergies  Allergen Reactions   Doxycycline Rash   Sulfa Antibiotics Rash and Other (See Comments)    Other reaction(s): Other (See Comments)  "Made me feel weird"    Current Medications: Current Outpatient Medications  Medication Sig Dispense Refill   amLODipine (NORVASC) 5 MG tablet Take 5 mg by mouth daily.     hydrochlorothiazide (HYDRODIURIL) 12.5 MG tablet Take 12.5 mg by mouth daily.     valsartan (DIOVAN) 160 MG tablet Take 160 mg by mouth daily.     aspirin 81 MG EC tablet Take 81 mg by mouth daily. Swallow whole.     b complex vitamins capsule Take 1 capsule by mouth daily.     Calcium Carbonate (CALCIUM 600 PO) Take 1 tablet by mouth daily.     Cholecalciferol (VITAMIN D3) 5000 units CAPS Take 1 capsule by mouth daily.     famotidine (PEPCID) 40 MG tablet Take 40 mg by mouth at bedtime.     gabapentin (NEURONTIN) 100 MG capsule Take 1 capsule (100 mg total) by mouth at bedtime. For 2 weeks, then 2 capsules by mouth at bedtime (Patient not taking: Reported on 03/29/2023) 50 capsule 5   KRILL OIL PO Take 1 capsule by mouth daily. Unknown strenght     Lactobacillus TABS Take 1 tablet by mouth 2 (two) times daily.     levothyroxine (SYNTHROID, LEVOTHROID) 75 MCG tablet Take 75 mcg by mouth daily before breakfast.      LORazepam (ATIVAN) 0.5 MG tablet Take 1 tablet (0.5 mg  total) by mouth every 8 (eight) hours. 30 tablet 0   NON FORMULARY Take 1 Dose by mouth See admin instructions. MMW: 3 parts Maalox 2 parts Benadryl 1 part viscious lidicaine  Disp. 6oz  Instructions: 5ml swish and swallow every 3-4 hours     omeprazole (PRILOSEC) 40 MG capsule Take 1 capsule (40 mg total) by mouth 2 (two) times daily. 60 capsule 5   ondansetron (ZOFRAN-ODT) 4 MG disintegrating tablet Take 1 tablet (4 mg total) by mouth every 8 (eight) hours as needed for nausea or vomiting. 90  tablet 0   polyethylene glycol powder (GLYCOLAX/MIRALAX) 17 GM/SCOOP powder Take 1 Container by mouth daily.     potassium chloride SA (KLOR-CON M) 20 MEQ tablet TAKE 1 TABLET TWICE A DAY 180 tablet 3   pravastatin (PRAVACHOL) 20 MG tablet Take 1 tablet (20 mg total) by mouth every evening. 90 tablet 3   Probiotic Product (PROBIOTIC DAILY PO) Take 1 tablet by mouth daily.     prochlorperazine (COMPAZINE) 10 MG tablet Take 1 tablet (10 mg total) by mouth every 6 (six) hours as needed for nausea or vomiting. 90 tablet 3   triamcinolone 0.1% oint-Eucerin equivalent cream 1:1 mixture Apply topically 2 (two) times daily. (Patient not taking: Reported on 03/29/2023) 160 g 0   No current facility-administered medications for this visit.    I,Jasmine M Lassiter,acting as a scribe for Dellia Beckwith, MD.,have documented all relevant documentation on the behalf of Dellia Beckwith, MD,as directed by  Dellia Beckwith, MD while in the presence of Dellia Beckwith, MD.

## 2023-04-12 ENCOUNTER — Inpatient Hospital Stay: Payer: Medicare Other

## 2023-04-12 ENCOUNTER — Encounter: Payer: Self-pay | Admitting: Oncology

## 2023-04-12 ENCOUNTER — Other Ambulatory Visit: Payer: Self-pay | Admitting: Pharmacist

## 2023-04-12 ENCOUNTER — Other Ambulatory Visit: Payer: Self-pay | Admitting: Oncology

## 2023-04-12 ENCOUNTER — Inpatient Hospital Stay (HOSPITAL_BASED_OUTPATIENT_CLINIC_OR_DEPARTMENT_OTHER): Payer: Medicare Other | Admitting: Oncology

## 2023-04-12 ENCOUNTER — Telehealth: Payer: Self-pay | Admitting: Oncology

## 2023-04-12 VITALS — BP 154/89 | HR 92 | Temp 97.7°F | Resp 16 | Ht 64.0 in | Wt 210.2 lb

## 2023-04-12 DIAGNOSIS — N142 Nephropathy induced by unspecified drug, medicament or biological substance: Secondary | ICD-10-CM | POA: Diagnosis not present

## 2023-04-12 DIAGNOSIS — E785 Hyperlipidemia, unspecified: Secondary | ICD-10-CM | POA: Diagnosis not present

## 2023-04-12 DIAGNOSIS — C168 Malignant neoplasm of overlapping sites of stomach: Secondary | ICD-10-CM

## 2023-04-12 DIAGNOSIS — N182 Chronic kidney disease, stage 2 (mild): Secondary | ICD-10-CM | POA: Diagnosis not present

## 2023-04-12 DIAGNOSIS — E039 Hypothyroidism, unspecified: Secondary | ICD-10-CM | POA: Diagnosis not present

## 2023-04-12 DIAGNOSIS — C169 Malignant neoplasm of stomach, unspecified: Secondary | ICD-10-CM | POA: Diagnosis not present

## 2023-04-12 DIAGNOSIS — Z5111 Encounter for antineoplastic chemotherapy: Secondary | ICD-10-CM | POA: Diagnosis not present

## 2023-04-12 DIAGNOSIS — I129 Hypertensive chronic kidney disease with stage 1 through stage 4 chronic kidney disease, or unspecified chronic kidney disease: Secondary | ICD-10-CM | POA: Diagnosis not present

## 2023-04-12 MED ORDER — HEPARIN SOD (PORK) LOCK FLUSH 100 UNIT/ML IV SOLN: 500.0000 [IU] | Freq: Once | INTRAVENOUS | Status: DC | PRN

## 2023-04-12 MED ORDER — PALONOSETRON HCL INJECTION 0.25 MG/5ML
0.2500 mg | Freq: Once | INTRAVENOUS | Status: AC
  Administered 2023-04-12: 0.25 mg via INTRAVENOUS
  Filled 2023-04-12: qty 5

## 2023-04-12 MED ORDER — DEXTROSE 5 % IV SOLN
Freq: Once | INTRAVENOUS | Status: AC
Start: 2023-04-12 — End: 2023-04-12

## 2023-04-12 MED ORDER — FLUOROURACIL CHEMO INJECTION 2.5 GM/50ML
320.0000 mg/m2 | Freq: Once | INTRAVENOUS | Status: AC
  Administered 2023-04-12: 650 mg via INTRAVENOUS
  Filled 2023-04-12: qty 13

## 2023-04-12 MED ORDER — SODIUM CHLORIDE 0.9% FLUSH: 10.0000 mL | INTRAVENOUS | Status: DC | PRN

## 2023-04-12 MED ORDER — SODIUM CHLORIDE 0.9 % IV SOLN
1960.0000 mg/m2 | INTRAVENOUS | Status: DC
  Administered 2023-04-12: 3850 mg via INTRAVENOUS
  Filled 2023-04-12: qty 77

## 2023-04-12 MED ORDER — SODIUM CHLORIDE 0.9 % IV SOLN: Freq: Once | INTRAVENOUS | Status: AC

## 2023-04-12 MED ORDER — SODIUM CHLORIDE 0.9 % IV SOLN
240.0000 mg | Freq: Once | INTRAVENOUS | Status: AC
  Administered 2023-04-12: 240 mg via INTRAVENOUS
  Filled 2023-04-12: qty 24

## 2023-04-12 MED ORDER — DEXAMETHASONE SODIUM PHOSPHATE 10 MG/ML IJ SOLN
10.0000 mg | Freq: Once | INTRAMUSCULAR | Status: AC
  Administered 2023-04-12: 10 mg via INTRAVENOUS
  Filled 2023-04-12: qty 1

## 2023-04-12 MED ORDER — LEUCOVORIN CALCIUM INJECTION 350 MG
320.0000 mg/m2 | Freq: Once | INTRAVENOUS | Status: AC
  Administered 2023-04-12: 630 mg via INTRAVENOUS
  Filled 2023-04-12: qty 31.5

## 2023-04-12 NOTE — Telephone Encounter (Signed)
 04/12/23 Next appt scheduled and confirmed with patient.

## 2023-04-12 NOTE — Progress Notes (Signed)
 Change treatment plan to every 21 days per Dr. Gilman Buttner.

## 2023-04-12 NOTE — Patient Instructions (Signed)
 The chemotherapy medication bag should finish at 46 hours. For example, if your pump is scheduled for 46 hours and it was put on at 4:00 p.m., it should finish at 2:00 p.m. the day it is scheduled to come off regardless of your appointment time.     Estimated time to finish at 1000.   If the display on your pump reads "Low Volume" and it is beeping, take the batteries out of the pump and come to the cancer center for it to be taken off.   If the pump alarms go off prior to the pump reading "Low Volume" then call [phone] and someone can assist you.  If the plunger comes out and the chemotherapy medication is leaking out, please use your home chemo spill kit to clean up the spill. Do NOT use paper towels or other household products.  If you have problems or questions regarding your pump, please call either [phone] (24 hours a day) or the cancer center Monday-Friday 8:00 a.m.- 4:30 p.m. at the clinic number and we will assist you. If you are unable to get assistance, then go to the nearest Emergency Department and ask the staff to contact the IV team for assistance.

## 2023-04-14 ENCOUNTER — Inpatient Hospital Stay: Payer: Medicare Other

## 2023-04-14 ENCOUNTER — Encounter: Payer: Self-pay | Admitting: Oncology

## 2023-04-14 VITALS — BP 131/54 | HR 85 | Temp 98.3°F | Resp 18

## 2023-04-14 DIAGNOSIS — I129 Hypertensive chronic kidney disease with stage 1 through stage 4 chronic kidney disease, or unspecified chronic kidney disease: Secondary | ICD-10-CM | POA: Diagnosis not present

## 2023-04-14 DIAGNOSIS — E785 Hyperlipidemia, unspecified: Secondary | ICD-10-CM | POA: Diagnosis not present

## 2023-04-14 DIAGNOSIS — N182 Chronic kidney disease, stage 2 (mild): Secondary | ICD-10-CM | POA: Diagnosis not present

## 2023-04-14 DIAGNOSIS — E039 Hypothyroidism, unspecified: Secondary | ICD-10-CM | POA: Diagnosis not present

## 2023-04-14 DIAGNOSIS — C169 Malignant neoplasm of stomach, unspecified: Secondary | ICD-10-CM | POA: Diagnosis not present

## 2023-04-14 DIAGNOSIS — Z5111 Encounter for antineoplastic chemotherapy: Secondary | ICD-10-CM | POA: Diagnosis not present

## 2023-04-14 DIAGNOSIS — N142 Nephropathy induced by unspecified drug, medicament or biological substance: Secondary | ICD-10-CM

## 2023-04-14 DIAGNOSIS — C168 Malignant neoplasm of overlapping sites of stomach: Secondary | ICD-10-CM

## 2023-04-14 LAB — BASIC METABOLIC PANEL - CANCER CENTER ONLY
Anion gap: 12 (ref 5–15)
BUN: 28 mg/dL — ABNORMAL HIGH (ref 8–23)
CO2: 21 mmol/L — ABNORMAL LOW (ref 22–32)
Calcium: 9.3 mg/dL (ref 8.9–10.3)
Chloride: 105 mmol/L (ref 98–111)
Creatinine: 1.44 mg/dL — ABNORMAL HIGH (ref 0.44–1.00)
GFR, Estimated: 35 mL/min — ABNORMAL LOW (ref >60)
Glucose, Bld: 174 mg/dL — ABNORMAL HIGH (ref 70–99)
Potassium: 4.1 mmol/L (ref 3.5–5.1)
Sodium: 139 mmol/L (ref 135–145)

## 2023-04-14 MED ORDER — SODIUM CHLORIDE 0.9 % IV SOLN: Freq: Once | INTRAVENOUS | Status: AC

## 2023-04-14 MED ORDER — SODIUM CHLORIDE 0.9% FLUSH: 10.0000 mL | INTRAVENOUS | Status: DC | PRN

## 2023-04-14 MED ORDER — HEPARIN SOD (PORK) LOCK FLUSH 100 UNIT/ML IV SOLN: 500.0000 [IU] | Freq: Once | INTRAVENOUS | Status: DC | PRN

## 2023-04-14 NOTE — Patient Instructions (Signed)

## 2023-04-14 NOTE — Patient Instructions (Signed)
 Central Line in Adults: What to Expect A central line is a soft tube that is put into a vein so that it goes to a large vein above your heart. It can be used to: Give you medicine or fluids. Give you nutrients. Take blood or give you blood for testing or treatments. Give chemotherapy or dialysis. Provide IV access if veins in your hands or arms are difficult to use. Types of central lines There are four main types of central lines: Peripherally inserted central catheter (PICC) line. This type is usually put in the upper arm and goes up the arm to the vein above your heart. This is used for one week or longer. Tunneled central line. This type is placed in a large vein in the neck, chest, or groin. It is tunneled under the skin and brought out through a small cut in your skin. Non-tunneled central line. This type is used for a shorter time than other types, usually for 7 days at the most. It is put in the neck, chest, or groin. Implanted port. This type can stay in place longer than other types of central lines. It is normally put in the upper chest but can also be placed in the upper arm or the belly. Surgery is needed to put it in and take it out. The type of central line you get will depend on how long you need it and your medical condition. Tell a doctor about: Any allergies you have. All medicines you take. These include vitamins, herbs, eye drops, and creams. Any problems you or family members have had with anesthesia. Any bleeding problems you have. Any surgeries you have had. Any medical conditions you have. Whether you're pregnant or may be pregnant. What are the risks? Your doctor will talk with you about risks. These may include: Infection. A blood clot. Bleeding. Getting a hole or crack in the central line. If this happens, the central line will need to be replaced. The catheter moving or coming out of place. A collapsed lung. Damage to other structures or  organs. What happens before the procedure? Medicines Ask about changing or stopping: Any medicines you take. Any vitamins, herbs, or supplements you take. Do not take aspirin or ibuprofen unless you're told to. Surgery safety For your safety, you may: Need to wash your skin with a soap that kills germs. Get antibiotics. Have your procedure site marked. Have hair removed at the procedure site. General instructions Eat and drink as told. Plan to have a responsible adult take you home from the hospital or clinic. Ask if you'll be staying overnight in the hospital. If you'll be going home right after the procedure, plan to have a responsible adult: Drive you home from the hospital or clinic. You won't be allowed to drive. Stay with you for the time you're told. What happens during the procedure? An IV will be put into one of your veins. You may be given: A sedative to help you relax. Anesthesia to keep you from feeling pain. Your skin will be cleaned with a soap that kills germs, and you may be covered with clean sheet called a drape. Your blood pressure, heart rate, breathing rate, and blood oxygen level will be checked during the procedure. The central line will be put into the vein and moved through it to the correct spot. The doctor may use X-ray equipment to help guide the central line to the right place. A bandage will be placed over the insertion area.  These steps may vary. Ask what you can expect. What can I expect after the procedure? You will watched closely until you leave. This includes checking your pain level, blood pressure, heart rate, and breathing rate. Caps may be put on the ends of the central line tubes. If you were given a sedative, do not drive or use machines until you're told it's safe. A sedative can make you sleepy. Follow these instructions at home: Caring for the tube  Follow instructions from your doctor about: Flushing the tube. Cleaning the tube and  the area around it. Only use germ-free, also called sterile, supplies to flush the central line. The supplies should be from your doctor, a pharmacy, or another place that your doctor recommends. Before you flush the tube or clean the area around it: Wash your hands with soap and water for at least 20 seconds. If you can't use soap and water, use hand sanitizer. Clean the central line hub with rubbing alcohol. To do this: Clean the central line hub with a new alcohol wipe. Scrub it using a twisting motion and rub for 10 to 15 seconds or for 30 twists. Follow the manufacturer's instructions. Be sure you scrub the top of the hub, not just the sides. Let the hub dry before use. Keep it from touching anything while drying. Do not re-use alcohol pads. Caring for your skin Check the skin around the central line every day for signs of infection. Check for: Redness, swelling, or pain. Fluid or blood. Warmth. Pus or a bad smell. Keep the area where the tube was put in clean and dry. Change bandages only as told by your doctor. Keep your bandage dry. If a bandage gets wet, have it changed right away. Storing and throwing away supplies Keep your supplies in a clean, dry location. Throw away any used syringes in a container that is only for sharp items, called a sharps container. You can buy a sharps container from a pharmacy, or you can make one by using an empty hard plastic bottle with a cover. Place any used bandages or infusion bags into a plastic bag. Throw that bag in the trash. General instructions Follow instructions from your doctor for the type of device that you have. Keep the tube clamped unless it's being used. If you or someone else accidentally pulls on the tube, make sure: The bandage is OK. There is no bleeding. The tube has not been pulled out. Do not use scissors or sharp objects near the tube. Do not take baths, swim, or use a hot tub until you're told it's OK. Ask if you can  shower. Ask your doctor what activities are safe for you. Your doctor may tell you not to lift anything or move your arm too much on the side of your central line. Contact a doctor if: You have signs of an infection where the tube was put in. The skin is irritated near the bandage. You catheter appears to be getting longer. This may mean it is coming out of the vein. Get help right away if: You have: A fever or chills. Shortness of breath. Pain in your chest. A fast heartbeat. Swelling in your neck, face, chest, or arm. You feel dizzy or you faint. There are red lines coming from where the tube was put in. The area where the tube was put in is bleeding and the bleeding won't stop. Your tube is hard to flush. You do not get a blood return from the  tube. The tube gets loose or comes out. The tube has a hole or a tear. The tube leaks. This information is not intended to replace advice given to you by your health care provider. Make sure you discuss any questions you have with your health care provider. Document Revised: 08/30/2022 Document Reviewed: 08/30/2022 Elsevier Patient Education  2024 [employer].

## 2023-04-19 ENCOUNTER — Other Ambulatory Visit: Payer: Self-pay | Admitting: Hematology and Oncology

## 2023-04-19 ENCOUNTER — Inpatient Hospital Stay: Payer: Medicare Other

## 2023-04-19 DIAGNOSIS — E876 Hypokalemia: Secondary | ICD-10-CM

## 2023-04-20 ENCOUNTER — Encounter: Payer: Self-pay | Admitting: Oncology

## 2023-04-20 ENCOUNTER — Inpatient Hospital Stay: Attending: Hematology and Oncology

## 2023-04-20 VITALS — BP 158/68 | HR 97 | Temp 98.0°F | Resp 18

## 2023-04-20 DIAGNOSIS — Z79899 Other long term (current) drug therapy: Secondary | ICD-10-CM | POA: Insufficient documentation

## 2023-04-20 DIAGNOSIS — R7989 Other specified abnormal findings of blood chemistry: Secondary | ICD-10-CM

## 2023-04-20 DIAGNOSIS — E876 Hypokalemia: Secondary | ICD-10-CM

## 2023-04-20 DIAGNOSIS — R918 Other nonspecific abnormal finding of lung field: Secondary | ICD-10-CM | POA: Diagnosis not present

## 2023-04-20 DIAGNOSIS — Z5111 Encounter for antineoplastic chemotherapy: Secondary | ICD-10-CM | POA: Diagnosis not present

## 2023-04-20 DIAGNOSIS — C169 Malignant neoplasm of stomach, unspecified: Secondary | ICD-10-CM | POA: Insufficient documentation

## 2023-04-20 DIAGNOSIS — K760 Fatty (change of) liver, not elsewhere classified: Secondary | ICD-10-CM | POA: Insufficient documentation

## 2023-04-20 DIAGNOSIS — C786 Secondary malignant neoplasm of retroperitoneum and peritoneum: Secondary | ICD-10-CM | POA: Insufficient documentation

## 2023-04-20 DIAGNOSIS — N393 Stress incontinence (female) (male): Secondary | ICD-10-CM | POA: Diagnosis not present

## 2023-04-20 DIAGNOSIS — E860 Dehydration: Secondary | ICD-10-CM

## 2023-04-20 DIAGNOSIS — N182 Chronic kidney disease, stage 2 (mild): Secondary | ICD-10-CM | POA: Diagnosis not present

## 2023-04-20 DIAGNOSIS — I129 Hypertensive chronic kidney disease with stage 1 through stage 4 chronic kidney disease, or unspecified chronic kidney disease: Secondary | ICD-10-CM | POA: Insufficient documentation

## 2023-04-20 MED ORDER — SODIUM CHLORIDE 0.9 % IV SOLN: Freq: Once | INTRAVENOUS | Status: AC

## 2023-04-20 MED ORDER — SODIUM CHLORIDE 0.9% FLUSH
10.0000 mL | Freq: Once | INTRAVENOUS | Status: AC | PRN
Start: 2023-04-20 — End: 2023-04-20
  Administered 2023-04-20: 10 mL

## 2023-04-20 MED ORDER — HEPARIN SOD (PORK) LOCK FLUSH 100 UNIT/ML IV SOLN
500.0000 [IU] | Freq: Once | INTRAVENOUS | Status: AC | PRN
  Administered 2023-04-20: 500 [IU]

## 2023-04-27 ENCOUNTER — Encounter: Payer: Self-pay | Admitting: Oncology

## 2023-05-03 ENCOUNTER — Inpatient Hospital Stay: Payer: Medicare Other

## 2023-05-03 ENCOUNTER — Ambulatory Visit (HOSPITAL_BASED_OUTPATIENT_CLINIC_OR_DEPARTMENT_OTHER)
Admission: RE | Admit: 2023-05-03 | Discharge: 2023-05-03 | Disposition: A | Discharge status: Home / Self Care | Source: Ambulatory Visit | Attending: Hematology and Oncology | Admitting: Hematology and Oncology

## 2023-05-03 ENCOUNTER — Other Ambulatory Visit (HOSPITAL_BASED_OUTPATIENT_CLINIC_OR_DEPARTMENT_OTHER): Payer: Self-pay

## 2023-05-03 ENCOUNTER — Other Ambulatory Visit (HOSPITAL_BASED_OUTPATIENT_CLINIC_OR_DEPARTMENT_OTHER): Admitting: Radiology

## 2023-05-03 ENCOUNTER — Other Ambulatory Visit: Payer: Self-pay | Admitting: Hematology and Oncology

## 2023-05-03 ENCOUNTER — Encounter: Payer: Self-pay | Admitting: Hematology and Oncology

## 2023-05-03 ENCOUNTER — Encounter: Payer: Self-pay | Admitting: Oncology

## 2023-05-03 ENCOUNTER — Inpatient Hospital Stay (HOSPITAL_BASED_OUTPATIENT_CLINIC_OR_DEPARTMENT_OTHER): Payer: Medicare Other | Admitting: Hematology and Oncology

## 2023-05-03 VITALS — BP 136/65 | HR 90 | Temp 98.0°F | Resp 20 | Ht 64.0 in | Wt 212.6 lb

## 2023-05-03 DIAGNOSIS — N393 Stress incontinence (female) (male): Secondary | ICD-10-CM

## 2023-05-03 DIAGNOSIS — R918 Other nonspecific abnormal finding of lung field: Secondary | ICD-10-CM | POA: Diagnosis not present

## 2023-05-03 DIAGNOSIS — M7989 Other specified soft tissue disorders: Secondary | ICD-10-CM

## 2023-05-03 DIAGNOSIS — C786 Secondary malignant neoplasm of retroperitoneum and peritoneum: Secondary | ICD-10-CM

## 2023-05-03 DIAGNOSIS — C168 Malignant neoplasm of overlapping sites of stomach: Secondary | ICD-10-CM

## 2023-05-03 DIAGNOSIS — I129 Hypertensive chronic kidney disease with stage 1 through stage 4 chronic kidney disease, or unspecified chronic kidney disease: Secondary | ICD-10-CM | POA: Diagnosis not present

## 2023-05-03 DIAGNOSIS — R8290 Unspecified abnormal findings in urine: Secondary | ICD-10-CM | POA: Insufficient documentation

## 2023-05-03 DIAGNOSIS — I82451 Acute embolism and thrombosis of right peroneal vein: Secondary | ICD-10-CM | POA: Diagnosis not present

## 2023-05-03 DIAGNOSIS — C169 Malignant neoplasm of stomach, unspecified: Secondary | ICD-10-CM | POA: Diagnosis not present

## 2023-05-03 DIAGNOSIS — K760 Fatty (change of) liver, not elsewhere classified: Secondary | ICD-10-CM | POA: Diagnosis not present

## 2023-05-03 DIAGNOSIS — N182 Chronic kidney disease, stage 2 (mild): Secondary | ICD-10-CM

## 2023-05-03 DIAGNOSIS — M79661 Pain in right lower leg: Secondary | ICD-10-CM | POA: Diagnosis not present

## 2023-05-03 DIAGNOSIS — I82431 Acute embolism and thrombosis of right popliteal vein: Secondary | ICD-10-CM | POA: Diagnosis not present

## 2023-05-03 DIAGNOSIS — N142 Nephropathy induced by unspecified drug, medicament or biological substance: Secondary | ICD-10-CM

## 2023-05-03 DIAGNOSIS — Z5111 Encounter for antineoplastic chemotherapy: Secondary | ICD-10-CM | POA: Diagnosis not present

## 2023-05-03 DIAGNOSIS — Z79899 Other long term (current) drug therapy: Secondary | ICD-10-CM | POA: Diagnosis not present

## 2023-05-03 LAB — CMP (CANCER CENTER ONLY)
ALT: 29 U/L (ref 0–44)
AST: 29 U/L (ref 15–41)
Albumin: 4.3 g/dL (ref 3.5–5.0)
Alkaline Phosphatase: 101 U/L (ref 38–126)
Anion gap: 13 (ref 5–15)
BUN: 26 mg/dL — ABNORMAL HIGH (ref 8–23)
CO2: 22 mmol/L (ref 22–32)
Calcium: 9.6 mg/dL (ref 8.9–10.3)
Chloride: 104 mmol/L (ref 98–111)
Creatinine: 1.24 mg/dL — ABNORMAL HIGH (ref 0.44–1.00)
GFR, Estimated: 42 mL/min — ABNORMAL LOW (ref >60)
Glucose, Bld: 125 mg/dL — ABNORMAL HIGH (ref 70–99)
Potassium: 4.1 mmol/L (ref 3.5–5.1)
Sodium: 138 mmol/L (ref 135–145)
Total Bilirubin: 0.4 mg/dL (ref 0.0–1.2)
Total Protein: 6.3 g/dL — ABNORMAL LOW (ref 6.5–8.1)

## 2023-05-03 LAB — CBC WITH DIFFERENTIAL (CANCER CENTER ONLY)
Abs Immature Granulocytes: 0.02 10*3/uL (ref 0.00–0.07)
Basophils Absolute: 0.1 10*3/uL (ref 0.0–0.1)
Basophils Relative: 1 %
Eosinophils Absolute: 0.3 10*3/uL (ref 0.0–0.5)
Eosinophils Relative: 5 %
HCT: 35.9 % — ABNORMAL LOW (ref 36.0–46.0)
Hemoglobin: 12.3 g/dL (ref 12.0–15.0)
Immature Granulocytes: 0 %
Lymphocytes Relative: 36 %
Lymphs Abs: 2.4 10*3/uL (ref 0.7–4.0)
MCH: 29.9 pg (ref 26.0–34.0)
MCHC: 34.3 g/dL (ref 30.0–36.0)
MCV: 87.3 fL (ref 80.0–100.0)
Monocytes Absolute: 0.9 10*3/uL (ref 0.1–1.0)
Monocytes Relative: 13 %
Neutro Abs: 3.1 10*3/uL (ref 1.7–7.7)
Neutrophils Relative %: 45 %
Platelet Count: 192 10*3/uL (ref 150–400)
RBC: 4.11 MIL/uL (ref 3.87–5.11)
RDW: 15.2 % (ref 11.5–15.5)
WBC Count: 6.9 10*3/uL (ref 4.0–10.5)
nRBC: 0 % (ref 0.0–0.2)
nRBC: 0 /100{WBCs}

## 2023-05-03 LAB — URINALYSIS, COMPLETE (UACMP) WITH MICROSCOPIC
Bilirubin Urine: NEGATIVE
Glucose, UA: NEGATIVE mg/dL
Hgb urine dipstick: NEGATIVE
Ketones, ur: NEGATIVE mg/dL
Nitrite: NEGATIVE
Protein, ur: NEGATIVE mg/dL
Specific Gravity, Urine: 1.005 (ref 1.005–1.030)
pH: 6 (ref 5.0–8.0)

## 2023-05-03 LAB — TSH: TSH: 2.348 u[IU]/mL (ref 0.350–4.500)

## 2023-05-03 MED ORDER — SODIUM CHLORIDE 0.9 % IV SOLN
240.0000 mg | Freq: Once | INTRAVENOUS | Status: AC
  Administered 2023-05-03: 240 mg via INTRAVENOUS
  Filled 2023-05-03: qty 24

## 2023-05-03 MED ORDER — SODIUM CHLORIDE 0.9 % IV SOLN
1960.0000 mg/m2 | INTRAVENOUS | Status: DC
  Administered 2023-05-03: 3850 mg via INTRAVENOUS
  Filled 2023-05-03: qty 77

## 2023-05-03 MED ORDER — SODIUM CHLORIDE 0.9 % IV SOLN: Freq: Once | INTRAVENOUS | Status: AC

## 2023-05-03 MED ORDER — FLUOROURACIL CHEMO INJECTION 2.5 GM/50ML
320.0000 mg/m2 | Freq: Once | INTRAVENOUS | Status: AC
  Administered 2023-05-03: 650 mg via INTRAVENOUS
  Filled 2023-05-03: qty 13

## 2023-05-03 MED ORDER — LEUCOVORIN CALCIUM INJECTION 350 MG
320.0000 mg/m2 | Freq: Once | INTRAVENOUS | Status: AC
  Administered 2023-05-03: 630 mg via INTRAVENOUS
  Filled 2023-05-03: qty 31.5

## 2023-05-03 MED ORDER — PALONOSETRON HCL INJECTION 0.25 MG/5ML
0.2500 mg | Freq: Once | INTRAVENOUS | Status: AC
  Administered 2023-05-03: 0.25 mg via INTRAVENOUS
  Filled 2023-05-03: qty 5

## 2023-05-03 MED ORDER — DEXTROSE 5 % IV SOLN: Freq: Once | INTRAVENOUS | Status: AC

## 2023-05-03 MED ORDER — APIXABAN 5 MG PO TABS
ORAL_TABLET | ORAL | 0 refills | Status: DC
  Filled 2023-05-03: qty 60, 23d supply, fill #0

## 2023-05-03 MED ORDER — DEXAMETHASONE SODIUM PHOSPHATE 10 MG/ML IJ SOLN
10.0000 mg | Freq: Once | INTRAMUSCULAR | Status: AC
  Administered 2023-05-03: 10 mg via INTRAVENOUS
  Filled 2023-05-03: qty 1

## 2023-05-03 NOTE — Assessment & Plan Note (Addendum)
 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  She has had chronic kidney disease since that episode.  We have continued to give her IV fluids the week of her chemotherapy.  Her hemoglobin has improved, but I would still give her IV fluids this week with chemotherapy and on the day of her pump DC.

## 2023-05-03 NOTE — Progress Notes (Addendum)
 [facility]  [address] [place],  [place]  [address] [phone]   Addendum: Venous ultrasound of the right lower extremity revealed an occlusive deep venous thrombosis along the popliteal and peroneal vein.  After discussion with Dr. Gilman Buttner, will start the patient on apixaban 10 mg twice daily for 7 days then continue apixaban 5 mg twice daily indefinitely.  As the patient is on aspirin 81 mg daily for no specific reason, I will have her discontinue that.  The patient understands the risk for increased bleeding and bruising while on apixaban.  She knows to seek immediate medical attention if she experiences abnormal bruising or bleeding or sustains an injury.  The patient and her daughter are in agreement.  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  Stain for HER2 was negative.  PET scan revealed omental involvement. She received palliative chemotherapy with FOLFOX/nivolumab (5-fluorouracil/leucovorin/oxaliplatin/nivolumab). Oxaliplatin was discontinued after 11 cycles, and she continues 5-fluorouracil/leucovorin/nivolumab.  PET in February 2024 revealed resolution of metabolic activity associated with the stomach.  Decrease in size of omental nodularity with no associated metabolic activity.   EGD in March revealed residual disease.  CT imaging every few months has remained stable. 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  She receives extra IV fluids with her infusion to prevent recurrent acute kidney injury.  She otherwise continues to tolerate treatment well.  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   Her disease has remained stable with treatment but still persists, so we recommended we continue the maintenance treatment and repeat imaging in 3 months. Most recent CT chest, abdomen and pelvis in February revealed similar mild distal gastric wall thickening with perigastric fat stranding/soft tissue thickening. No new suspicious finding. No evidence of metastatic disease within the chest, abdomen, or pelvis. Stable small solid pulmonary nodules in the left lung base measuring up to 6 mm, favored benign. Diffuse hepatic steatosis. Following that, we changed her schedule from every 2 weeks to every 3 weeks.  She will proceed with 35th cycle of 5-fluorouracil/leucovorin/nivolumab cycle today. We will plan to see her back in 3 weeks with a CBC, comprehensive metabolic panel, and TSH prior to a 36th cycle.  Drug-induced interstitial nephritis History immune mediated nephritis from immunotherapy in February, which resolved with prednisone.  She has had chronic kidney disease since that episode.  We have continued to give her IV fluids the week of her chemotherapy.  Her hemoglobin has improved, but I would still give her IV fluids this week with chemotherapy and on the day of her pump DC.  Tenderness of right calf Right calf tenderness, which is new, with increased pain and swelling of the right lower extremity.  I will schedule her for stat ultrasound to evaluate for DVT.  She understands that if a blood clot is found, we would recommend some form of blood thinner other than the aspirin she is already taking.  Foul smelling urine Report of foul-smelling urine.  She has had stress incontinence.  Will obtain a urinalysis and urine culture today for further evaluation.  The patient understands the plans discussed today and is in agreement with them.  She knows to contact our office if she develops concerns  prior to her next appointment.   I provided 30 minutes of face-to-face time during this encounter and > 50% was spent counseling as documented under my assessment and plan.    Mackenzie Perl, PA-C  [facility] [place] - [facility] OF [facility] [address] [place] [address] Dept: [phone] Dept Fax: [phone]   Orders Placed This Encounter  Procedures   Culture, Urine    Standing Status:   Future    Number of Occurrences:   1    Expected Date:   05/03/2023    Expiration Date:   05/02/2024   US Venous Img Lower Unilateral Right (DVT)    Standing Status:   Future    Number of Occurrences:   1    Expected Date:   05/03/2023    Expiration Date:   05/02/2024    Reason for Exam (SYMPTOM  OR DIAGNOSIS REQUIRED):   increased swelling, calf tenderness    Preferred imaging location?:   [facility]   Urinalysis, Complete w Microscopic    Standing Status:   Future    Number of Occurrences:   1    Expected Date:   05/03/2023    Expiration Date:   05/02/2024      CHIEF COMPLAINT:  CC: Stage IVB gastric cancer  Current Treatment:  Maintenance fluorouracil/leucovorin/nivolumab every 21 days  HISTORY OF PRESENT ILLNESS:   Oncology History  Gastric cancer (HCC)  09/10/2021 Initial Diagnosis   Gastric cancer (HCC)   09/10/2021 Cancer Staging   Staging form: Stomach, AJCC 8th Edition - Clinical stage from 09/10/2021: Stage IVB (cT4b, cN0, cM1) - Signed by Dellia Beckwith, MD on 09/10/2021 Histopathologic type: Adenocarcinoma, NOS Stage prefix: Initial diagnosis Total positive nodes: 0 Histologic grade (G): G3 Histologic grading system: 3 grade system Sites of metastasis: Peritoneal surface Diagnostic confirmation: Positive histology PLUS positive immunophenotyping and/or positive genetic studies Specimen type: Endoscopy with Biopsy Staged by: Managing physician Carcinoembryonic antigen (CEA) (ng/mL): 2.8 Carbohydrate  antigen 19-9 (CA 19-9) (U/mL): 4.9 HER2 status: Unknown Microsatellite instability (MSI): Unknown Tumor location in stomach: Other Clinical staging modalities: Biopsy, Endoscopy Stage used in treatment planning: Yes National guidelines used in treatment planning: Yes Type of national guideline used in treatment planning: NCCN   09/28/2021 - 10/14/2021 Chemotherapy   Patient is on Treatment Plan : GASTROESOPHAGEAL FOLFOX + Nivolumab q14d     09/28/2021 -  Chemotherapy   Patient is on Treatment Plan : GASTROESOPHAGEAL FOLFOX + Nivolumab q21d (changed from q14d on 04/12/23)     12/09/2021 Genetic Testing   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  Report date is 12/09/2021.   The Multi-Cancer + RNA Panel offered by [facility] includes sequencing and/or deletion/duplication analysis of the following 84 genes:  AIP*, ALK, APC*, ATM*, AXIN2*, BAP1*, BARD1*, BLM*, BMPR1A*, BRCA1*, BRCA2*, BRIP1*, CASR, CDC73*, CDH1*, CDK4, CDKN1B*, CDKN1C*, CDKN2A, CEBPA, CHEK2*, CTNNA1*, DICER1*, DIS3L2*, EGFR, EPCAM, FH*, FLCN*, GATA2*, GPC3, GREM1, HOXB13, HRAS, KIT, MAX*, MEN1*, MET, MITF, MLH1*, MSH2*, MSH3*, MSH6*, MUTYH*, NBN*, NF1*, NF2*, NTHL1*, PALB2*, PDGFRA, PHOX2B, PMS2*, POLD1*, POLE*, POT1*, PRKAR1A*, PTCH1*, PTEN*, RAD50*, RAD51C*, RAD51D*, RB1*, RECQL4, RET, RUNX1*, SDHA*, SDHAF2*, SDHB*, SDHC*, SDHD*, SMAD4*, SMARCA4*, SMARCB1*, SMARCE1*, STK11*, SUFU*, TERC, TERT, TMEM127*, Tp53*, TSC1*, TSC2*, VHL*, WRN*, and WT1.  RNA analysis is performed  for * genes.       INTERVAL HISTORY:  Chanika is here today for repeat clinical assessment prior to a 35th cycle of fluorouracil/leucovorin/nivolumab, which she is now receiving every 3 weeks.  She continues to do fairly well.  She has been more fatigued.  She otherwise continues to tolerate therapy without significant difficulty.  She has occasional lightheadedness with standing.  She reports foul smelling urine, urgency of  urination and stress incontinence.  She has right knee pain and left shoulder, as well as occasional lower back.  She rates her pain 5/10 today.  She uses Tylenol as needed.  She is also on aspirin 81 mg daily.  X-rays reveal degenerative changes. She reports increased swelling and pain of the right leg, which she attributes to neuropathy.  She reports stable neuropathy of the left leg.  She had been on gabapentin, but didn't feel this helped. The max she took was twice daily.  She does not wish to try this again.  She denies fevers or chills. She denies pain. Her appetite is good. Her weight has increased 2 pounds over last 3 weeks .  REVIEW OF SYSTEMS:  Review of Systems  Constitutional:  Negative for appetite change, chills, diaphoresis, fatigue, fever and unexpected weight change.  HENT:   Negative for lump/mass, mouth sores and sore throat.   Respiratory:  Negative for cough and shortness of breath.   Cardiovascular:  Positive for leg swelling (bilateral, right greater than left). Negative for chest pain.  Gastrointestinal:  Negative for abdominal pain, blood in stool, constipation, diarrhea, nausea and vomiting.  Endocrine: Negative for hot flashes.  Genitourinary:  Positive for bladder incontinence (stress incontinence). Negative for difficulty urinating, dysuria, frequency, hematuria and vaginal bleeding.   Musculoskeletal:  Positive for arthralgias (right knee, left shoulder), back pain (intermittent lower back with exertion) and myalgias (bilateral legs right.>left). Negative for gait problem and neck pain.  Skin:  Negative for itching and rash.  Neurological:  Positive for light-headedness (when stands up quickly) and numbness (neuropathy hands and feet). Negative for dizziness, extremity weakness, gait problem and headaches.  Hematological:  Negative for adenopathy. Does not bruise/bleed easily.  Psychiatric/Behavioral:  Negative for depression and sleep disturbance. The patient is not  nervous/anxious.      VITALS:  Blood pressure 136/65, pulse 90, temperature 98 F (36.7 C), temperature source Oral, resp. rate 20, height 5\' 4"  (1.626 m), weight 212 lb 9.6 oz (96.4 kg), SpO2 97%.  Wt Readings from Last 3 Encounters:  05/03/23 212 lb 9.6 oz (96.4 kg)  04/12/23 210 lb 3.2 oz (95.3 kg)  03/31/23 208 lb 1.9 oz (94.4 kg)    Body mass index is 36.49 kg/m.  Performance status (ECOG): 2 - Symptomatic, <50% confined to bed  PHYSICAL EXAM:  Physical Exam Vitals and nursing note reviewed.  Constitutional:      General: She is not in acute distress.    Appearance: Normal appearance. She is not ill-appearing, toxic-appearing or diaphoretic.  HENT:     Head: Normocephalic and atraumatic.     Mouth/Throat:     Mouth: Mucous membranes are moist.     Pharynx: Oropharynx is clear. No oropharyngeal exudate or posterior oropharyngeal erythema.  Eyes:     General: No scleral icterus.    Extraocular Movements: Extraocular movements intact.     Conjunctiva/sclera: Conjunctivae normal.     Pupils: Pupils are equal, round, and reactive to light.  Cardiovascular:     Rate and Rhythm: Normal rate  and regular rhythm.     Heart sounds: Normal heart sounds. No murmur heard.    No friction rub. No gallop.  Pulmonary:     Effort: Pulmonary effort is normal.     Breath sounds: Normal breath sounds. No wheezing, rhonchi or rales.  Abdominal:     General: There is no distension.     Palpations: Abdomen is soft. There is no mass.     Tenderness: There is no abdominal tenderness.  Musculoskeletal:        General: Normal range of motion.     Cervical back: Normal range of motion and neck supple. No tenderness.     Right lower leg: Tenderness (calf tenderness) present. Edema (1-2+) present.     Left lower leg: Left lower leg edema: 1+.  Lymphadenopathy:     Cervical: No cervical adenopathy.     Upper Body:     Right upper body: No supraclavicular or axillary adenopathy.     Left upper  body: No supraclavicular or axillary adenopathy.     Lower Body: No right inguinal adenopathy. No left inguinal adenopathy.  Skin:    General: Skin is warm and dry.     Coloration: Skin is not jaundiced.     Findings: No rash.  Neurological:     Mental Status: She is alert and oriented to person, place, and time.     Cranial Nerves: No cranial nerve deficit.  Psychiatric:        Mood and Affect: Mood normal.        Behavior: Behavior normal.        Thought Content: Thought content normal.     LABS:      Latest Ref Rng & Units 05/03/2023    8:38 AM 04/07/2023    9:43 AM 03/29/2023    9:50 AM  CBC  WBC 4.0 - 10.5 K/uL 6.9  8.1  7.2   Hemoglobin 12.0 - 15.0 g/dL 04.5  40.9  81.1   Hematocrit 36.0 - 46.0 % 35.9  36.8  36.3   Platelets 150 - 400 K/uL 192  188  162       Latest Ref Rng & Units 05/03/2023    8:38 AM 04/14/2023    9:39 AM 04/07/2023    9:43 AM  CMP  Glucose 70 - 99 mg/dL 914  782  956   BUN 8 - 23 mg/dL 26  28  20    Creatinine 0.44 - 1.00 mg/dL 2.13  0.86  5.78   Sodium 135 - 145 mmol/L 138  139  139   Potassium 3.5 - 5.1 mmol/L 4.1  4.1  4.0   Chloride 98 - 111 mmol/L 104  105  105   CO2 22 - 32 mmol/L 22  21  22    Calcium 8.9 - 10.3 mg/dL 9.6  9.3  9.7   Total Protein 6.5 - 8.1 g/dL 6.3   6.5   Total Bilirubin 0.0 - 1.2 mg/dL 0.4   0.6   Alkaline Phos 38 - 126 U/L 101   94   AST 15 - 41 U/L 29   19   ALT 0 - 44 U/L 29   14      Lab Results  Component Value Date   CEA1 2.9 09/10/2021   /  CEA  Date Value Ref Range Status  09/10/2021 2.9 0.0 - 4.7 ng/mL Final    Comment:    (NOTE)  Nonsmokers          <3.9                             Smokers             <5.6 [employer] Electrochemiluminescence Immunoassay (ECLIA) Values obtained with different assay methods or kits cannot be used interchangeably.  Results cannot be interpreted as absolute evidence of the presence or absence of malignant disease. Performed At: [facility] [address] [place], [place] [id number] Jolene Schimke MD NW:[id number]     STUDIES:  US Venous Img Lower Unilateral Right (DVT) Result Date: 05/03/2023 CLINICAL DATA:  Increased swelling, calf tenderness. History of gastric cancer EXAM: Right LOWER EXTREMITY VENOUS DOPPLER ULTRASOUND TECHNIQUE: Gray-scale sonography with graded compression, as well as color Doppler and duplex ultrasound were performed to evaluate the lower extremity deep venous systems from the level of the common femoral vein and including the common femoral, femoral, profunda femoral, popliteal and calf veins including the posterior tibial, peroneal and gastrocnemius veins when visible. The superficial great saphenous vein was also interrogated. Spectral Doppler was utilized to evaluate flow at rest and with distal augmentation maneuvers in the common femoral, femoral and popliteal veins. COMPARISON:  None Available. FINDINGS: Contralateral Common Femoral Vein: Respiratory phasicity is normal and symmetric with the symptomatic side. No evidence of thrombus. Normal compressibility. Common Femoral Vein: No evidence of thrombus. Normal compressibility, respiratory phasicity and response to augmentation. Saphenofemoral Junction: No evidence of thrombus. Normal compressibility and flow on color Doppler imaging. Profunda Femoral Vein: No evidence of thrombus. Normal compressibility and flow on color Doppler imaging. Femoral Vein: No evidence of thrombus. Normal compressibility and flow on color Doppler imaging. Popliteal Vein: Echogenic material seen with poor flow on Doppler. Calf Veins: Peroneal vein thrombus as well. Superficial Great Saphenous Vein: No evidence of thrombus. Normal compressibility. Venous Reflux:  None. Other Findings: Complex cystic area identified popliteal fossa measuring 6.5 x 1.4 by 2.6 cm. IMPRESSION: Occlusive thrombus along the popliteal vein and peroneal vein. Critical Value/emergent results  were called by telephone at the time of interpretation on 05/03/2023 at 1:44 pm to provider [facility] , who verbally acknowledged these results. Electronically Signed   By: Karen Kays M.D.   On: 05/03/2023 13:44   CT CHEST ABDOMEN PELVIS W CONTRAST Result Date: 04/07/2023 CLINICAL DATA:  Gastric cancer, monitor. * Tracking Code: BO * EXAM: CT CHEST, ABDOMEN, AND PELVIS WITH CONTRAST TECHNIQUE: Multidetector CT imaging of the chest, abdomen and pelvis was performed following the standard protocol during bolus administration of intravenous contrast. RADIATION DOSE REDUCTION: This exam was performed according to the departmental dose-optimization program which includes automated exposure control, adjustment of the mA and/or kV according to patient size and/or use of iterative reconstruction technique. CONTRAST:  80mL OMNIPAQUE IOHEXOL 300 MG/ML  SOLN COMPARISON:  Multiple priors including most recent CT December 06, 2022. FINDINGS: CT CHEST FINDINGS Cardiovascular: Central venous catheter with tip in the SVC. Aortic atherosclerosis. No central pulmonary embolus on this nondedicated study. Normal size heart. No significant pericardial effusion/thickening. Mediastinum/Nodes: Stable prominent mediastinal lymph nodes not pathologically enlarged by size criteria. No suspicious thyroid nodule. The esophagus is grossly unremarkable. Lungs/Pleura: Stable small solid pulmonary nodules in the left lung base for instance 2 on image 82/302 measuring 6 mm unchanged from prior. No new suspicious pulmonary nodules or masses. Subpleural reticulations similar prior compatible with interstitial lung disease. Musculoskeletal: No aggressive  lytic or blastic lesion of bone. Multilevel degenerative change of the spine. CT ABDOMEN PELVIS FINDINGS Hepatobiliary: Diffuse hepatic steatosis. No suspicious hepatic lesion. Gallbladder is unremarkable. Similar prominence of the biliary tree with tapering to the level of the ampulla favored  senescent change. Pancreas: No pancreatic ductal dilation or evidence of acute inflammation. Spleen: No splenomegaly. Adrenals/Urinary Tract: No suspicious adrenal nodule/mass. No hydronephrosis. Kidneys demonstrate symmetric enhancement. Urinary bladder is unremarkable for degree of distension. Stomach/Bowel: Similar mild gastric wall thickening. No new suspicious wall thickening or nodularity. No pathologic dilation of small or large bowel. Appendectomy. No evidence of acute bowel inflammation. Vascular/Lymphatic: Aortic atherosclerosis. Normal caliber abdominal aorta. Retroaortic left renal vein. Smooth IVC contours. No pathologically enlarged abdominal or pelvic lymph nodes. Reproductive: Status post hysterectomy. No adnexal masses. Other: Similar perigastric fat stranding. No walled-off fluid collection. Musculoskeletal: No aggressive lytic or blastic lesion of bone. IMPRESSION: 1. Similar mild distal gastric wall thickening with perigastric fat stranding/soft tissue thickening. No new suspicious finding. 2. No evidence of metastatic disease within the chest, abdomen, or pelvis. 3. Stable small solid pulmonary nodules in the left lung base measuring up to 6 mm, favored benign. 4. Diffuse hepatic steatosis. 5.  Aortic Atherosclerosis (ICD10-I70.0). Electronically Signed   By: Maudry Mayhew M.D.   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  If that happens, she is to call us so that we can schedule her for an ultrasound to look for synovitis.     Essential hypertension 09/09/2016   Gastric cancer (HCC) 09/10/2021   Hyperlipidemia 09/01/2016   Hypertension    Hypothyroidism 09/01/2016   Osteoarthritis of both feet 01/20/2016   Osteoarthritis, hand 01/20/2016   Thyroid disease     Past Surgical History:  Procedure Laterality Date   APPENDECTOMY     LAPAROSCOPIC APPENDECTOMY N/A 04/10/2016   Procedure: APPENDECTOMY LAPAROSCOPIC;  Surgeon: Abigail Miyamoto, MD;  Location: [facility] OR;  Service: General;  Laterality: N/A;    Family History  Problem Relation Age of Onset   Hypertension Mother    Prostate cancer Father        metastatic; d. 60   Brain cancer Sister 27   Breast cancer Sister 45   AAA (abdominal aortic aneurysm) Brother    Leukemia Cousin        x2 maternal female cousins; d. before 45   Breast cancer Daughter 3       DCIS    Social History:  reports that she has never smoked. She has never used smokeless tobacco. She reports that she does not currently use alcohol. She reports that she does not use drugs.The patient is accompanied by her daughter today.  Allergies:  Allergies  Allergen Reactions   Doxycycline Rash   Sulfa Antibiotics Rash and Other (See Comments)    Other reaction(s): Other (See Comments)  "Made me feel weird"    Current Medications: Current  Outpatient Medications  Medication Sig Dispense Refill   amLODipine (NORVASC) 5 MG tablet Take 5 mg by mouth daily.     apixaban (ELIQUIS) 5 MG TABS tablet Take 2 tablets (10mg ) twice daily for 7 days, then 1 tablet (5mg ) twice daily 60 tablet 0   b complex vitamins capsule Take 1 capsule by mouth daily.     Calcium Carbonate (CALCIUM 600 PO) Take 1 tablet by mouth daily.     Cholecalciferol (VITAMIN D3) 5000 units CAPS Take 1 capsule by mouth daily.     famotidine (PEPCID) 40 MG tablet Take 40 mg by mouth  at bedtime.     gabapentin (NEURONTIN) 100 MG capsule Take 1 capsule (100 mg total) by mouth at bedtime. For 2 weeks, then 2 capsules by mouth at bedtime (Patient not taking: Reported on 03/29/2023) 50 capsule 5   hydrochlorothiazide (HYDRODIURIL) 12.5 MG tablet Take 12.5 mg by mouth daily.     KRILL OIL PO Take 1 capsule by mouth daily. Unknown strenght     Lactobacillus TABS Take 1 tablet by mouth 2 (two) times daily.     levothyroxine (SYNTHROID, LEVOTHROID) 75 MCG tablet Take 75 mcg by mouth daily before breakfast.      LORazepam (ATIVAN) 0.5 MG tablet Take 1 tablet (0.5 mg total) by mouth every 8 (eight) hours. 30 tablet 0   NON FORMULARY Take 1 Dose by mouth See admin instructions. MMW: 3 parts Maalox 2 parts Benadryl 1 part viscious lidicaine  Disp. 6oz  Instructions: 5ml swish and swallow every 3-4 hours     omeprazole (PRILOSEC) 40 MG capsule Take 1 capsule (40 mg total) by mouth 2 (two) times daily. 60 capsule 5   ondansetron (ZOFRAN-ODT) 4 MG disintegrating tablet Take 1 tablet (4 mg total) by mouth every 8 (eight) hours as needed for nausea or vomiting. 90 tablet 0   polyethylene glycol powder (GLYCOLAX/MIRALAX) 17 GM/SCOOP powder Take 1 Container by mouth daily.     potassium chloride SA (KLOR-CON M) 20 MEQ tablet TAKE 1 TABLET TWICE A DAY 180 tablet 3   pravastatin (PRAVACHOL) 20 MG tablet Take 1 tablet (20 mg total) by mouth every evening. 90 tablet 3   Probiotic Product (PROBIOTIC DAILY PO) Take 1 tablet by mouth daily.     prochlorperazine (COMPAZINE) 10 MG tablet Take 1 tablet (10 mg total) by mouth every 6 (six) hours as needed for nausea or vomiting. 90 tablet 3   triamcinolone 0.1% oint-Eucerin equivalent cream 1:1 mixture Apply topically 2 (two) times daily. (Patient not taking: Reported on 03/29/2023) 160 g 0   valsartan (DIOVAN) 160 MG tablet Take 160 mg by mouth daily.     No current facility-administered medications for this visit.

## 2023-05-03 NOTE — Patient Instructions (Signed)
 [facility] [place] [facility]  Discharge Instructions: Thank you for choosing [facility] to provide your oncology and hematology care.  If you have a lab appointment with the Cancer Center, please go directly to the Cancer Center and check in at the registration area.   Wear comfortable clothing and clothing appropriate for easy access to any Portacath or PICC line.   We strive to give you quality time with your provider. You may need to reschedule your appointment if you arrive late (15 or more minutes).  Arriving late affects you and other patients whose appointments are after yours.  Also, if you miss three or more appointments without notifying the office, you may be dismissed from the clinic at the provider's discretion.      For prescription refill requests, have your pharmacy contact our office and allow 72 hours for refills to be completed.    Today you received the following chemotherapy and/or immunotherapy agents Opdivo, leucovorin, flurouracil       To help prevent nausea and vomiting after your treatment, we encourage you to take your nausea medication as directed.  BELOW ARE SYMPTOMS THAT SHOULD BE REPORTED IMMEDIATELY: *FEVER GREATER THAN 100.4 F (38 C) OR HIGHER *CHILLS OR SWEATING *NAUSEA AND VOMITING THAT IS NOT CONTROLLED WITH YOUR NAUSEA MEDICATION *UNUSUAL SHORTNESS OF BREATH *UNUSUAL BRUISING OR BLEEDING *URINARY PROBLEMS (pain or burning when urinating, or frequent urination) *BOWEL PROBLEMS (unusual diarrhea, constipation, pain near the anus) TENDERNESS IN MOUTH AND THROAT WITH OR WITHOUT PRESENCE OF ULCERS (sore throat, sores in mouth, or a toothache) UNUSUAL RASH, SWELLING OR PAIN  UNUSUAL VAGINAL DISCHARGE OR ITCHING   Items with * indicate a potential emergency and should be followed up as soon as possible or go to the Emergency Department if any problems should occur.  Please show the CHEMOTHERAPY ALERT CARD  or IMMUNOTHERAPY ALERT CARD at check-in to the Emergency Department and triage nurse.  Should you have questions after your visit or need to cancel or reschedule your appointment, please contact [facility] [place] - [facility]  Dept: [phone]  and follow the prompts.  Office hours are 8:00 a.m. to 4:30 p.m. Monday - Friday. Please note that voicemails left after 4:00 p.m. may not be returned until the following business day.  We are closed weekends and major holidays. You have access to a nurse at all times for urgent questions. Please call the main number to the clinic Dept: [phone] and follow the prompts.  For any non-urgent questions, you may also contact your provider using MyChart. We now offer e-Visits for anyone 27 and older to request care online for non-urgent symptoms. For details visit [email or web address].   Also download the MyChart app! Go to the app store, search "MyChart", open the app, select [place], and log in with your MyChart username and password.

## 2023-05-03 NOTE — Assessment & Plan Note (Signed)
 Report of foul-smelling urine.  She has had stress incontinence.  Will obtain a urinalysis and urine culture today for further evaluation.

## 2023-05-03 NOTE — Assessment & Plan Note (Signed)
 Right calf tenderness, which is new, with increased pain and swelling of the right lower extremity.  I will schedule her for stat ultrasound to evaluate for DVT.  She understands that if a blood clot is found, we would recommend some form of blood thinner other than the aspirin she is already taking.

## 2023-05-03 NOTE — Assessment & Plan Note (Addendum)
 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  Stain for HER2 was negative.  PET scan revealed omental involvement. She received palliative chemotherapy with FOLFOX/nivolumab (5-fluorouracil/leucovorin/oxaliplatin/nivolumab). Oxaliplatin was discontinued after 11 cycles, and she continues 5-fluorouracil/leucovorin/nivolumab.  PET in February 2024 revealed resolution of metabolic activity associated with the stomach.  Decrease in size of omental nodularity with no associated metabolic activity.   EGD in March revealed residual disease.  CT imaging every few months has remained stable. 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  She receives extra IV fluids with her infusion to prevent recurrent acute kidney injury.  She otherwise continues to tolerate treatment well.  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   Her disease has remained stable with treatment but still persists, so we recommended we continue the maintenance treatment and repeat imaging in 3 months. Most recent CT chest, abdomen and pelvis in February revealed similar mild distal gastric wall thickening with perigastric fat stranding/soft tissue thickening. No new suspicious finding. No evidence of metastatic disease within the chest, abdomen, or pelvis. Stable small solid pulmonary nodules in the left lung base measuring up to 6 mm, favored benign. Diffuse hepatic steatosis. Following that, we changed her schedule from every 2 weeks to every 3 weeks.  She will proceed with 35th cycle of 5-fluorouracil/leucovorin/nivolumab cycle today. We will plan to see her back in 3 weeks with a CBC, comprehensive metabolic panel,  and TSH prior to a 36th cycle.

## 2023-05-04 LAB — URINE CULTURE: Culture: <10000 — AB

## 2023-05-05 ENCOUNTER — Inpatient Hospital Stay: Payer: Medicare Other

## 2023-05-05 ENCOUNTER — Telehealth: Payer: Self-pay

## 2023-05-05 ENCOUNTER — Encounter: Payer: Self-pay | Admitting: Oncology

## 2023-05-05 VITALS — BP 114/61 | HR 84 | Temp 97.9°F | Resp 22

## 2023-05-05 DIAGNOSIS — C786 Secondary malignant neoplasm of retroperitoneum and peritoneum: Secondary | ICD-10-CM | POA: Diagnosis not present

## 2023-05-05 DIAGNOSIS — K760 Fatty (change of) liver, not elsewhere classified: Secondary | ICD-10-CM | POA: Diagnosis not present

## 2023-05-05 DIAGNOSIS — R918 Other nonspecific abnormal finding of lung field: Secondary | ICD-10-CM | POA: Diagnosis not present

## 2023-05-05 DIAGNOSIS — C168 Malignant neoplasm of overlapping sites of stomach: Secondary | ICD-10-CM

## 2023-05-05 DIAGNOSIS — I129 Hypertensive chronic kidney disease with stage 1 through stage 4 chronic kidney disease, or unspecified chronic kidney disease: Secondary | ICD-10-CM | POA: Diagnosis not present

## 2023-05-05 DIAGNOSIS — C169 Malignant neoplasm of stomach, unspecified: Secondary | ICD-10-CM | POA: Diagnosis not present

## 2023-05-05 DIAGNOSIS — Z5111 Encounter for antineoplastic chemotherapy: Secondary | ICD-10-CM | POA: Diagnosis not present

## 2023-05-05 LAB — T4: T4, Total: 11 ug/dL (ref 4.5–12.0)

## 2023-05-05 MED ORDER — SODIUM CHLORIDE 0.9 % IV SOLN: Freq: Once | INTRAVENOUS | Status: AC

## 2023-05-05 MED ORDER — HEPARIN SOD (PORK) LOCK FLUSH 100 UNIT/ML IV SOLN
500.0000 [IU] | Freq: Once | INTRAVENOUS | Status: AC | PRN
  Administered 2023-05-05: 500 [IU]

## 2023-05-05 MED ORDER — SODIUM CHLORIDE 0.9% FLUSH
10.0000 mL | INTRAVENOUS | Status: DC | PRN
  Administered 2023-05-05: 10 mL

## 2023-05-05 NOTE — Progress Notes (Signed)
 Right leg is edematous- patient reports that it is much improved in last 2 days. Taking Eloquis as ordered.

## 2023-05-05 NOTE — Telephone Encounter (Signed)
 Detailed message left for patient.

## 2023-05-05 NOTE — Telephone Encounter (Signed)
-----   Message from Adah Perl sent at 05/04/2023  6:20 PM EDT ----- Please let her know urine culture was negative

## 2023-05-05 NOTE — Patient Instructions (Signed)
 Managing Chemotherapy Side Effects, Adult Chemotherapy is a treatment that uses medicine to kill cancer cells. However, in addition to killing cancer cells, the medicines can also damage healthy cells. The damage to healthy cells can lead to side effects. The exact side effects depend on the specific medicines used. Most of the side effects of chemotherapy go away once treatment is finished. Until then, work closely with your health care providers and take an active role in managing your side effects. What are common side effects of chemotherapy? Increased risk of infection, bruising, or bleeding. Nausea and vomiting. Constipation or diarrhea. Loss of appetite. Hair loss. Mouth or throat sores. Tiredness (fatigue). Tingling, pain, or numbness in the hands and feet. Dry, sensitive, itchy, or sore skin. Sleep disturbances, such as excessive sleepiness. Confusion, anxiety, or mood swings. Memory changes. How to manage the side effects of chemotherapy Medicines Take over-the-counter and prescription medicines only as told by your health care provider. Talk with your health care provider before taking vitamins, herbs, supplements, or over-the-counter medicines. Some of these can interfere with chemotherapy. Activity Get plenty of rest. Get regular exercise by doing activities such as walking, gentle yoga, or tai chi. Return to your normal activities as told by your health care provider. Ask your health care provider what activities are safe for you. Eating and drinking  Talk to a dietitian about what you should eat and drink during cancer treatment. Drink enough fluid to keep your urine pale yellow. If you have side effects that affect eating, these tips may help: Eat smaller meals and snacks often. Drink high-nutrition and high-calorie shakes or supplements. Choose bland and soft foods that are easy to eat. Do not eat foods that are hot, spicy, or hard to swallow. Do not eat raw or  undercooked meat, eggs, or seafood. Always wash fresh fruits and vegetables well before eating them. Skin care If you have sore or itchy skin: Wear soft, comfortable clothing. Apply creams and ointments to your skin as told by your health care provider. If you lose your hair, consider wearing a wig, hat, or scarf to cover your head. You may want to have someone shave your head as you start to lose hair. During outdoor activities, protect your head and skin from the sun by using sunscreen with an SPF of 30 or higher or by wearing protective clothing and a hat. Meet with a hair and skin care specialist for makeup and skin care tips. Apply sunscreen to your scalp as told by your health care provider. General tips Learn as much as you can about your condition. If you are struggling emotionally, talk with a mental health care provider or join a support group. Keep all follow-up visits. This is important. How to prevent infection and bleeding Chemotherapy may lower your blood counts and put you at risk for infection and bleeding. Here are some ways to help prevent problems. Vaccines Talk to your health care provider about vaccines. You should not get any live vaccines, such as the polio, MMR, chickenpox, and shingles vaccines until your health care provider says that it is safe to do so. Do not be around people who have had live vaccines for as long as your health care provider recommends. Make sure you get a yearly flu shot. People who will be near you should also get a yearly flu shot. Social activity Stay away from crowded places where you could be exposed to germs. Do not be around people who may be sick or  people who have symptoms of a fever until they have been fever-free for at least 24 hours. Do not share food, cups, straws, or utensils with other people. Wear a mask when outside the home if your blood counts are low. Cleanliness  Wash your hands often for at least 20 seconds. Also make  sure that other members of your household wash their hands often. Brush your teeth twice daily using a soft toothbrush. Use mouth rinse only as told by your health care provider. Take a bath or shower daily unless your health care provider gives different instructions. General tips Take your temperature regularly, especially if you have chills or feel warm. Check with your health care provider: Before you travel. Before you have a dental procedure. Before you use a swimming pool, hot tub, or swim in a lake or ocean. If you get chemotherapy through an IV or port, check the site every day for signs of infection. Check for redness, swelling, pain, fluid, and warmth. Avoid activities that put you at risk for injury or bleeding. Use an electric razor to shave instead of a blade. Questions to ask your health care provider What are the most common side effects of my treatment? How will they affect my daily life? What can I do to manage them? What are some possible long-term side effects? What are possible complications? What support services are available? What number can I call with questions or concerns? Where to find support Cancer affects the entire family. Find out what family support resources are available from your cancer treatment center. For more support, turn to: Your cancer care team. Friends and family. Your religious community. Other people with cancer. Community-based or online support groups. Where to find more information National Cancer Institute: www.cancer.gov American Cancer Society: www.cancer.org Contact a health care provider if: You bleed or bruise more often. You notice blood in your urine or stool. You have any of these symptoms: A skin rash, or dry or itchy skin. A headache or stiff neck. Cold or flu symptoms. A cough. Persistent nausea or vomiting. Persistent diarrhea. Frequent urination, burning when passing urine, or foul-smelling urine. You cannot eat  because of mouth or throat pain. You are sad, confused, anxious, or depressed. Get help right away if: You have any of these symptoms: A fever or chills. Your health care provider should know about this right away. Redness, swelling, pain, fluid, or warmth near an IV site. Bleeding that you cannot stop. A seizure. You cannot swallow. You have chest pain. You have trouble breathing. A family member or caregiver should get help right away if you have a sudden or unusual change in behavior. These symptoms may be an emergency. Get help right away. Call 911. Do not wait to see if the symptoms will go away. Do not drive yourself to the hospital. Summary Chemotherapy is a treatment that uses medicine to kill cancer cells and can cause side effects. The specific side effects depend on the specific medicines used. Learn as much as you can about your condition. Ask about side effects to watch for and how to treat them. Seek out support and resources from others. Find out what family support resources are available from your cancer treatment center. Let your health care provider know if you notice any new, unusual, or worsening symptoms, especially fever or chills. This information is not intended to replace advice given to you by your health care provider. Make sure you discuss any questions you have with your health  care provider. Document Revised: 01/22/2021 Document Reviewed: 01/22/2021 Elsevier Patient Education  2024 [employer].

## 2023-05-11 ENCOUNTER — Inpatient Hospital Stay

## 2023-05-11 VITALS — BP 143/84 | HR 98 | Temp 97.7°F | Resp 20

## 2023-05-11 DIAGNOSIS — I129 Hypertensive chronic kidney disease with stage 1 through stage 4 chronic kidney disease, or unspecified chronic kidney disease: Secondary | ICD-10-CM | POA: Diagnosis not present

## 2023-05-11 DIAGNOSIS — C786 Secondary malignant neoplasm of retroperitoneum and peritoneum: Secondary | ICD-10-CM | POA: Diagnosis not present

## 2023-05-11 DIAGNOSIS — C169 Malignant neoplasm of stomach, unspecified: Secondary | ICD-10-CM | POA: Diagnosis not present

## 2023-05-11 DIAGNOSIS — E876 Hypokalemia: Secondary | ICD-10-CM

## 2023-05-11 DIAGNOSIS — R7989 Other specified abnormal findings of blood chemistry: Secondary | ICD-10-CM

## 2023-05-11 DIAGNOSIS — Z5111 Encounter for antineoplastic chemotherapy: Secondary | ICD-10-CM | POA: Diagnosis not present

## 2023-05-11 DIAGNOSIS — E860 Dehydration: Secondary | ICD-10-CM

## 2023-05-11 DIAGNOSIS — K760 Fatty (change of) liver, not elsewhere classified: Secondary | ICD-10-CM | POA: Diagnosis not present

## 2023-05-11 DIAGNOSIS — R918 Other nonspecific abnormal finding of lung field: Secondary | ICD-10-CM | POA: Diagnosis not present

## 2023-05-11 MED ORDER — HEPARIN SOD (PORK) LOCK FLUSH 100 UNIT/ML IV SOLN
500.0000 [IU] | Freq: Once | INTRAVENOUS | Status: AC | PRN
  Administered 2023-05-11: 500 [IU]

## 2023-05-11 MED ORDER — APIXABAN 5 MG PO TABS
5.0000 mg | ORAL_TABLET | Freq: Two times a day (BID) | ORAL | 1 refills | Status: DC
Start: 2023-05-11 — End: 2023-10-07

## 2023-05-11 MED ORDER — SODIUM CHLORIDE 0.9% FLUSH
10.0000 mL | Freq: Once | INTRAVENOUS | Status: AC | PRN
  Administered 2023-05-11: 10 mL

## 2023-05-11 MED ORDER — SODIUM CHLORIDE 0.9 % IV SOLN: Freq: Once | INTRAVENOUS | Status: AC

## 2023-05-11 NOTE — Patient Instructions (Signed)
 Dehydration, Adult Dehydration is a condition in which there is not enough water or other fluids in the body. This happens when a person loses more fluids than they take in. Important organs cannot work right without the right amount of fluids. Any loss of fluids from the body can cause dehydration. Dehydration can be mild, worse, or very bad. It should be treated right away to keep it from getting very bad. What are the causes? Conditions that cause loss of water in the body. They include: Watery poop (diarrhea). Vomiting. Sweating a lot. Fever. Infection. Peeing (urinating) a lot. Not drinking enough fluids. Certain medicines, such as medicines that take extra fluid out of the body (diuretics). Lack of safe drinking water. Not being able to get enough water and food. What increases the risk? Having a long-term (chronic) illness that has not been treated the right way, such as: Diabetes. Heart disease. Kidney disease. Being 25 years of age or older. Having a disability. Living in a place that is high above the ground or sea (high in altitude). The thinner, drier air causes more fluid loss. Doing exercises that put stress on your body for a long time. Being active when in hot places. What are the signs or symptoms? Symptoms of dehydration depend on how bad it is. Mild or worse dehydration Thirst. Dry lips or dry mouth. Feeling dizzy or light-headed. Muscle cramps. Passing little pee or dark pee. Pee may be the color of tea. Headache. Very bad dehydration Changes in skin. Skin may: Be cold to the touch (clammy). Be blotchy or pale. Not go back to normal right after you pinch it and let it go. Little or no tears, pee, or sweat. Fast breathing. Low blood pressure. Weak pulse. Pulse that is more than 100 beats a minute when you are sitting still. Other changes, such as: Feeling very thirsty. Eyes that look hollow (sunken). Cold hands and feet. Being confused. Being very  tired (lethargic) or having trouble waking from sleep. Losing weight. Loss of consciousness. How is this treated? Treatment for this condition depends on how bad your dehydration is. Treatment should start right away. Do not wait until your condition gets very bad. Very bad dehydration is an emergency. You will need to go to a hospital. Mild or worse dehydration can be treated at home. You may be asked to: Drink more fluids. Drink an oral rehydration solution (ORS). This drink gives you the right amount of fluids, salts, and minerals (electrolytes). Very bad dehydration can be treated: With fluids through an IV tube. By correcting low levels of electrolytes in the body. By treating the problem that caused your dehydration. Follow these instructions at home: Oral rehydration solution If told by your doctor, drink an ORS: Make an ORS. Use instructions on the package. Start by drinking small amounts, about  cup (120 mL) every 5-10 minutes. Slowly drink more until you have had the amount that your doctor said to have.  Eating and drinking  Drink enough clear fluid to keep your pee pale yellow. If you were told to drink an ORS, finish the ORS first. Then, start slowly drinking other clear fluids. Drink fluids such as: Water. Do not drink only water. Doing that can make the salt (sodium) level in your body get too low. Water from ice chips you suck on. Fruit juice that you have added water to (diluted). Low-calorie sports drinks. Eat foods that have the right amounts of salts and minerals, such as bananas, oranges, potatoes,  tomatoes, or spinach. Do not drink alcohol. Avoid drinks that have caffeine or sugar. These include:: High-calorie sports drinks. Fruit juice that you did not add water to. Soda. Coffee or energy drinks. Avoid foods that are greasy or have a lot of fat or sugar. General instructions Take over-the-counter and prescription medicines only as told by your doctor. Do  not take sodium tablets. Doing that can make the salt level in your body get too high. Return to your normal activities as told by your doctor. Ask your doctor what activities are safe for you. Keep all follow-up visits. Your doctor may check and change your treatment. Contact a doctor if: You have pain in your belly (abdomen) and the pain: Gets worse. Stays in one place. You have a rash. You have a stiff neck. You get angry or annoyed more easily than normal. You are more tired or have a harder time waking than normal. You feel weak or dizzy. You feel very thirsty. Get help right away if: You have any symptoms of very bad dehydration. You vomit every time you eat or drink. Your vomiting gets worse, does not go away, or you vomit blood or green stuff. You are getting treatment, but symptoms are getting worse. You have a fever. You have a very bad headache. You have: Diarrhea that gets worse or does not go away. Blood in your poop (stool). This may cause poop to look black and tarry. No pee in 6-8 hours. Only a small amount of pee in 6-8 hours, and the pee is very dark. You have trouble breathing. These symptoms may be an emergency. Get help right away. Call 911. Do not wait to see if the symptoms will go away. Do not drive yourself to the hospital. This information is not intended to replace advice given to you by your health care provider. Make sure you discuss any questions you have with your health care provider. Document Revised: 08/31/2021 Document Reviewed: 08/31/2021 Elsevier Patient Education  2024 [employer].

## 2023-05-18 ENCOUNTER — Encounter: Payer: Self-pay | Admitting: Oncology

## 2023-05-23 ENCOUNTER — Other Ambulatory Visit: Payer: Self-pay | Admitting: Oncology

## 2023-05-23 DIAGNOSIS — C168 Malignant neoplasm of overlapping sites of stomach: Secondary | ICD-10-CM

## 2023-05-24 ENCOUNTER — Inpatient Hospital Stay: Payer: Medicare Other | Attending: Hematology and Oncology

## 2023-05-24 ENCOUNTER — Encounter: Payer: Self-pay | Admitting: Oncology

## 2023-05-24 ENCOUNTER — Inpatient Hospital Stay: Payer: Medicare Other

## 2023-05-24 ENCOUNTER — Telehealth: Payer: Self-pay | Admitting: Oncology

## 2023-05-24 ENCOUNTER — Inpatient Hospital Stay: Payer: Medicare Other | Admitting: Oncology

## 2023-05-24 ENCOUNTER — Other Ambulatory Visit: Payer: Self-pay | Admitting: Oncology

## 2023-05-24 VITALS — BP 138/73 | HR 88 | Temp 97.7°F | Resp 18 | Ht 64.0 in | Wt 213.3 lb

## 2023-05-24 DIAGNOSIS — Z86718 Personal history of other venous thrombosis and embolism: Secondary | ICD-10-CM | POA: Insufficient documentation

## 2023-05-24 DIAGNOSIS — Z7901 Long term (current) use of anticoagulants: Secondary | ICD-10-CM | POA: Diagnosis not present

## 2023-05-24 DIAGNOSIS — C169 Malignant neoplasm of stomach, unspecified: Secondary | ICD-10-CM | POA: Insufficient documentation

## 2023-05-24 DIAGNOSIS — C168 Malignant neoplasm of overlapping sites of stomach: Secondary | ICD-10-CM

## 2023-05-24 DIAGNOSIS — Z79899 Other long term (current) drug therapy: Secondary | ICD-10-CM | POA: Diagnosis not present

## 2023-05-24 DIAGNOSIS — Z5111 Encounter for antineoplastic chemotherapy: Secondary | ICD-10-CM | POA: Insufficient documentation

## 2023-05-24 LAB — CBC WITH DIFFERENTIAL (CANCER CENTER ONLY)
Abs Immature Granulocytes: 0.02 10*3/uL (ref 0.00–0.07)
Basophils Absolute: 0 10*3/uL (ref 0.0–0.1)
Basophils Relative: 1 %
Eosinophils Absolute: 0.3 10*3/uL (ref 0.0–0.5)
Eosinophils Relative: 5 %
HCT: 36.1 % (ref 36.0–46.0)
Hemoglobin: 12.3 g/dL (ref 12.0–15.0)
Immature Granulocytes: 0 %
Lymphocytes Relative: 35 %
Lymphs Abs: 1.9 10*3/uL (ref 0.7–4.0)
MCH: 29.8 pg (ref 26.0–34.0)
MCHC: 34.1 g/dL (ref 30.0–36.0)
MCV: 87.4 fL (ref 80.0–100.0)
Monocytes Absolute: 0.6 10*3/uL (ref 0.1–1.0)
Monocytes Relative: 10 %
Neutro Abs: 2.6 10*3/uL (ref 1.7–7.7)
Neutrophils Relative %: 49 %
Platelet Count: 206 10*3/uL (ref 150–400)
RBC: 4.13 MIL/uL (ref 3.87–5.11)
RDW: 15 % (ref 11.5–15.5)
WBC Count: 5.3 10*3/uL (ref 4.0–10.5)
nRBC: 0 % (ref 0.0–0.2)
nRBC: 0 /100{WBCs}

## 2023-05-24 LAB — CMP (CANCER CENTER ONLY)
ALT: 27 U/L (ref 0–44)
AST: 29 U/L (ref 15–41)
Albumin: 4.2 g/dL (ref 3.5–5.0)
Alkaline Phosphatase: 99 U/L (ref 38–126)
Anion gap: 11 (ref 5–15)
BUN: 18 mg/dL (ref 8–23)
CO2: 21 mmol/L — ABNORMAL LOW (ref 22–32)
Calcium: 9.2 mg/dL (ref 8.9–10.3)
Chloride: 107 mmol/L (ref 98–111)
Creatinine: 1.28 mg/dL — ABNORMAL HIGH (ref 0.44–1.00)
GFR, Estimated: 41 mL/min — ABNORMAL LOW (ref >60)
Glucose, Bld: 188 mg/dL — ABNORMAL HIGH (ref 70–99)
Potassium: 4 mmol/L (ref 3.5–5.1)
Sodium: 138 mmol/L (ref 135–145)
Total Bilirubin: 0.4 mg/dL (ref 0.0–1.2)
Total Protein: 6.2 g/dL — ABNORMAL LOW (ref 6.5–8.1)

## 2023-05-24 LAB — TSH: TSH: 1.952 u[IU]/mL (ref 0.350–4.500)

## 2023-05-24 MED ORDER — SODIUM CHLORIDE 0.9 % IV SOLN: Freq: Once | INTRAVENOUS | Status: AC

## 2023-05-24 MED ORDER — DEXAMETHASONE SODIUM PHOSPHATE 10 MG/ML IJ SOLN
10.0000 mg | Freq: Once | INTRAMUSCULAR | Status: AC
  Administered 2023-05-24: 10 mg via INTRAVENOUS
  Filled 2023-05-24: qty 1

## 2023-05-24 MED ORDER — FLUOROURACIL CHEMO INJECTION 2.5 GM/50ML
320.0000 mg/m2 | Freq: Once | INTRAVENOUS | Status: AC
  Administered 2023-05-24: 650 mg via INTRAVENOUS
  Filled 2023-05-24: qty 13

## 2023-05-24 MED ORDER — SODIUM CHLORIDE 0.9% FLUSH: 10.0000 mL | INTRAVENOUS | Status: DC | PRN

## 2023-05-24 MED ORDER — HEPARIN SOD (PORK) LOCK FLUSH 100 UNIT/ML IV SOLN
500.0000 [IU] | Freq: Once | INTRAVENOUS | Status: DC | PRN
Start: 2023-05-24 — End: 2023-05-24

## 2023-05-24 MED ORDER — LEUCOVORIN CALCIUM INJECTION 350 MG
320.0000 mg/m2 | Freq: Once | INTRAVENOUS | Status: AC
  Administered 2023-05-24: 630 mg via INTRAVENOUS
  Filled 2023-05-24: qty 31.5

## 2023-05-24 MED ORDER — PALONOSETRON HCL INJECTION 0.25 MG/5ML
0.2500 mg | Freq: Once | INTRAVENOUS | Status: AC
  Administered 2023-05-24: 0.25 mg via INTRAVENOUS
  Filled 2023-05-24: qty 5

## 2023-05-24 MED ORDER — SODIUM CHLORIDE 0.9 % IV SOLN
240.0000 mg | Freq: Once | INTRAVENOUS | Status: AC
  Administered 2023-05-24: 240 mg via INTRAVENOUS
  Filled 2023-05-24: qty 24

## 2023-05-24 MED ORDER — SODIUM CHLORIDE 0.9 % IV SOLN
1960.0000 mg/m2 | INTRAVENOUS | Status: DC
  Administered 2023-05-24: 3850 mg via INTRAVENOUS
  Filled 2023-05-24: qty 77

## 2023-05-24 NOTE — Telephone Encounter (Signed)
 Patient has been scheduled for follow-up visit per 05/24/23 LOS.  Pt given an appt calendar with date and time.

## 2023-05-24 NOTE — Patient Instructions (Signed)
 The chemotherapy medication bag should finish at 46 hours.. For example, if your pump is scheduled for 46 hours and it was put on at 4:00 p.m., it should finish at 2:00 p.m. the day it is scheduled to come off regardless of your appointment time.     Estimated time to finish at 1000.   If the display on your pump reads "Low Volume" and it is beeping, take the batteries out of the pump and come to the cancer center for it to be taken off.   If the pump alarms go off prior to the pump reading "Low Volume" then call [phone] and someone can assist you.  If the plunger comes out and the chemotherapy medication is leaking out, please use your home chemo spill kit to clean up the spill. Do NOT use paper towels or other household products.  If you have problems or questions regarding your pump, please call either [phone] (24 hours a day) or the cancer center Monday-Friday 8:00 a.m.- 4:30 p.m. at the clinic number and we will assist you. If you are unable to get assistance, then go to the nearest Emergency Department and ask the staff to contact the IV team for assistance.     Nivolumab Injection What is this medication? NIVOLUMAB (nye VOL ue mab) treats some types of cancer. It works by helping your immune system slow or stop the spread of cancer cells. It is a monoclonal antibody. This medicine may be used for other purposes; ask your health care provider or pharmacist if you have questions. COMMON BRAND NAME(S): Opdivo What should I tell my care team before I take this medication? They need to know if you have any of these conditions: Allogeneic stem cell transplant (uses someone else's stem cells) Autoimmune diseases, such as Crohn disease, ulcerative colitis, lupus History of chest radiation Nervous system problems, such as Guillain-Barre syndrome or myasthenia gravis Organ transplant An unusual or allergic reaction to nivolumab, other medications, foods, dyes, or  preservatives Pregnant or trying to get pregnant Breast-feeding How should I use this medication? This medication is infused into a vein. It is given in a hospital or clinic setting. A special MedGuide will be given to you before each treatment. Be sure to read this information carefully each time. Talk to your care team about the use of this medication in children. While it may be prescribed for children as young as 12 years for selected conditions, precautions do apply. Overdosage: If you think you have taken too much of this medicine contact a poison control center or emergency room at once. NOTE: This medicine is only for you. Do not share this medicine with others. What if I miss a dose? Keep appointments for follow-up doses. It is important not to miss your dose. Call your care team if you are unable to keep an appointment. What may interact with this medication? Interactions have not been studied. This list may not describe all possible interactions. Give your health care provider a list of all the medicines, herbs, non-prescription drugs, or dietary supplements you use. Also tell them if you smoke, drink alcohol, or use illegal drugs. Some items may interact with your medicine. What should I watch for while using this medication? Your condition will be monitored carefully while you are receiving this medication. You may need blood work while taking this medication. This medication may cause serious skin reactions. They can happen weeks to months after starting the medication. Contact your care team right away if  you notice fevers or flu-like symptoms with a rash. The rash may be red or purple and then turn into blisters or peeling of the skin. You may also notice a red rash with swelling of the face, lips, or lymph nodes in your neck or under your arms. Tell your care team right away if you have any change in your eyesight. Talk to your care team if you are pregnant or think you might be  pregnant. A negative pregnancy test is required before starting this medication. A reliable form of contraception is recommended while taking this medication and for 5 months after the last dose. Talk to your care team about effective forms of contraception. Do not breast-feed while taking this medication and for 5 months after the last dose. What side effects may I notice from receiving this medication? Side effects that you should report to your care team as soon as possible: Allergic reactions--skin rash, itching, hives, swelling of the face, lips, tongue, or throat Dry cough, shortness of breath or trouble breathing Eye pain, redness, irritation, or discharge with blurry or decreased vision Heart muscle inflammation--unusual weakness or fatigue, shortness of breath, chest pain, fast or irregular heartbeat, dizziness, swelling of the ankles, feet, or hands Hormone gland problems--headache, sensitivity to light, unusual weakness or fatigue, dizziness, fast or irregular heartbeat, increased sensitivity to cold or heat, excessive sweating, constipation, hair loss, increased thirst or amount of urine, tremors or shaking, irritability Infusion reactions--chest pain, shortness of breath or trouble breathing, feeling faint or lightheaded Kidney injury (glomerulonephritis)--decrease in the amount of urine, red or dark brown urine, foamy or bubbly urine, swelling of the ankles, hands, or feet Liver injury--right upper belly pain, loss of appetite, nausea, light-colored stool, dark yellow or brown urine, yellowing skin or eyes, unusual weakness or fatigue Pain, tingling, or numbness in the hands or feet, muscle weakness, change in vision, confusion or trouble speaking, loss of balance or coordination, trouble walking, seizures Rash, fever, and swollen lymph nodes Redness, blistering, peeling, or loosening of the skin, including inside the mouth Sudden or severe stomach pain, bloody diarrhea, fever, nausea,  vomiting Side effects that usually do not require medical attention (report these to your care team if they continue or are bothersome): Bone, joint, or muscle pain Diarrhea Fatigue Loss of appetite Nausea Skin rash This list may not describe all possible side effects. Call your doctor for medical advice about side effects. You may report side effects to FDA at 1-800-FDA-1088. Where should I keep my medication? This medication is given in a hospital or clinic. It will not be stored at home. NOTE: This sheet is a summary. It may not cover all possible information. If you have questions about this medicine, talk to your doctor, pharmacist, or health care provider.  2024 Elsevier/Gold Standard (2021-06-01 00:00:00)Leucovorin Injection What is this medication? LEUCOVORIN (loo koe VOR in) prevents side effects from certain medications, such as methotrexate. It works by increasing folate levels. This helps protect healthy cells in your body. It may also be used to treat anemia caused by low levels of folate. It can also be used with fluorouracil, a type of chemotherapy, to treat colorectal cancer. It works by increasing the effects of fluorouracil in the body. This medicine may be used for other purposes; ask your health care provider or pharmacist if you have questions. What should I tell my care team before I take this medication? They need to know if you have any of these conditions: Anemia from low  levels of vitamin B12 in the blood An unusual or allergic reaction to leucovorin, folic acid, other medications, foods, dyes, or preservatives Pregnant or trying to get pregnant Breastfeeding How should I use this medication? This medication is injected into a vein or a muscle. It is given by your care team in a hospital or clinic setting. Talk to your care team about the use of this medication in children. Special care may be needed. Overdosage: If you think you have taken too much of this  medicine contact a poison control center or emergency room at once. NOTE: This medicine is only for you. Do not share this medicine with others. What if I miss a dose? Keep appointments for follow-up doses. It is important not to miss your dose. Call your care team if you are unable to keep an appointment. What may interact with this medication? Capecitabine Fluorouracil Phenobarbital Phenytoin Primidone Trimethoprim;sulfamethoxazole This list may not describe all possible interactions. Give your health care provider a list of all the medicines, herbs, non-prescription drugs, or dietary supplements you use. Also tell them if you smoke, drink alcohol, or use illegal drugs. Some items may interact with your medicine. What should I watch for while using this medication? Your condition will be monitored carefully while you are receiving this medication. This medication may increase the side effects of 5-fluorouracil. Tell your care team if you have diarrhea or mouth sores that do not get better or that get worse. What side effects may I notice from receiving this medication? Side effects that you should report to your care team as soon as possible: Allergic reactions--skin rash, itching, hives, swelling of the face, lips, tongue, or throat This list may not describe all possible side effects. Call your doctor for medical advice about side effects. You may report side effects to FDA at 1-800-FDA-1088. Where should I keep my medication? This medication is given in a hospital or clinic. It will not be stored at home. NOTE: This sheet is a summary. It may not cover all possible information. If you have questions about this medicine, talk to your doctor, pharmacist, or health care provider.  2024 Elsevier/Gold Standard (2021-07-07 00:00:00)

## 2023-05-24 NOTE — Progress Notes (Signed)
 [facility]  [address] [place],  [place]  [address] [phone] Clinic Day: 05/24/2023  Referring physician: Nolia Baumgartner, MD  ASSESSMENT & PLAN:  Assessment: Gastric cancer (HCC) Stage IVB (T4 N0 M1) poorly differentiated adenocarcinoma of the stomach with signet ring features and ulceration, metastatic to the omentum. Stain for HER2 was negative. PET scan revealed omental involvement, but no distant metastasis was seen. She is receiving palliative chemotherapy with FOLFOX/nivolumab (5-fluorouracil/leucovorin/oxaliplatin/nivolumab), which now consists of 5-fluorouracil/leucovorin/nivolumab. Oxaliplatin was discontinued after 11 cycles. PET in February 2024 revealed resolution of metabolic activity associated with the stomach. Decrease in size of omental nodularity. No associated metabolic activity. EGD in March revealed residual disease.   CT chest, abdomen and pelvis in July, 2024 revealed stable disease with persistent mild gastric wall thickening and subtle adjacent omental nodularity.  CT chest, abdomen pelvis and October revealed persistent wall thickening along the distal stomach with the adjacent stranding and soft tissue thickening, no additional areas of peritoneal nodularity or developing lymph node enlargement. Once again changes of interstitial lung disease with fibrosis and bronchiectasis were seen.  Her disease remains stable with treatment but still persists, so we recommended we continue the maintenance treatment and repeat imaging in 3 months. She continues to tolerate treatment well. 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 CT chest, abdomen, and pelvis done on 04/07/2023 revealed similar mild distal gastric wall thickening with perigastric fat stranding/soft tissue thickening but no new suspicious finding, no evidence of  metastatic disease within the chest, abdomen, or pelvis, stable small solid pulmonary nodules in the left lung base measuring up to 6 mm which are favored to be benign and diffuse hepatic steatosis. She will proceed with her 36th cycle of 5-fluorouracil/leucovorin/nivolumab cycle today but we changed to every 3 weeks now.   Drug-induced interstitial nephritis History immune mediated nephritis from immunotherapy in February, which resolved with prednisone and holding the nivolumab. She has had chronic kidney disease since that episode. Her creatinine has increased again, but still within previous range. We continue to give her IV fluids the week of her chemotherapy and plan to do so weekly.    Right knee pain Recurrent acute right knee pain. No calf pain or tenderness, no leg swelling. X-ray of the right knee revealed mild degenerative changes with osteophyte formation.   Deep Venous Thrombosis On 05/03/2023 she was found to have an occlusive deep venous thrombosis along the popliteal and peroneal vein was started on apixaban 10 mg BID for 7 days then apixaban 5 mg BID indefinitely and was discontinued on aspirin 81 mg daily.   Plan: On 05/03/2023 she was found to have an occlusive deep venous thrombosis along the popliteal and peroneal vein and was started on apixaban 10 mg BID for 7 days then apixaban 5 mg BID indefinitely and was discontinued on aspirin 81 mg daily. I explained  to her that she will likely need to stay on anticoagulation since she still has persistent malignancy. Her day 1 cycle 36 of FOLFOX and Nivolumab is scheduled on 05/24/2023 and her day 3 pump D/C cycle 36 is scheduled on 05/26/2023. She now receives this every 3 weeks and notes being more fatigued on this schedule. I will plan weekly IV fluids. She has a WBC of 5.3, hemoglobin of 12.3, and platelet count of 206,000. Her CMP is normal other than a elevated  creatinine of 1.28 and low total protein of 6.2. Her thyroid levels are  also pending and I will call her with the results. I will plan repeat scans towards the end of May.  [name] in 3 weeks with CBC, CMP, T4, and TSH for her next cycle of treatment, and then [name] back in 6 weeks with CBC, CMP, TSH, T4, and CT chest, abdomen, and pelvis. The patient understands the plans discussed today and is in agreement with them.  She knows to contact our office if she develops concerns prior to her next appointment.  I provided 29 minutes of face-to-face time during this encounter and > 50% was spent counseling as documented under my assessment and plan.   Nolia Baumgartner, MD [name] [address] [place] [address] Dept: [phone] Dept Fax: [phone]   No orders of the defined types were placed in this encounter.   CHIEF COMPLAINT:  CC: Stage IVB gastric cancer  Current Treatment:  Maintenance fluorouracil/leucovorin/nivolumab every 21 days  HISTORY OF PRESENT ILLNESS:   Oncology History  Gastric cancer (HCC)  09/10/2021 Initial Diagnosis   Gastric cancer (HCC)   09/10/2021 Cancer Staging   Staging form: Stomach, AJCC 8th Edition - Clinical stage from 09/10/2021: Stage IVB (cT4b, cN0, cM1) - Signed by Nolia Baumgartner, MD on 09/10/2021 Histopathologic type: Adenocarcinoma, NOS Stage prefix: Initial diagnosis Total positive nodes: 0 Histologic grade (G): G3 Histologic grading system: 3 grade system Sites of metastasis: Peritoneal surface Diagnostic confirmation: Positive histology PLUS positive immunophenotyping and/or positive genetic studies Specimen type: Endoscopy with Biopsy Staged by: Managing physician Carcinoembryonic antigen (CEA) (ng/mL): 2.8 Carbohydrate antigen 19-9 (CA 19-9) (U/mL): 4.9 HER2 status: Unknown Microsatellite instability (MSI): Unknown Tumor location in stomach: Other Clinical staging modalities: Biopsy,  Endoscopy Stage used in treatment planning: Yes National guidelines used in treatment planning: Yes Type of national guideline used in treatment planning: NCCN   09/28/2021 - 10/14/2021 Chemotherapy   Patient is on Treatment Plan : GASTROESOPHAGEAL FOLFOX + Nivolumab q14d     09/28/2021 -  Chemotherapy   Patient is on Treatment Plan : GASTROESOPHAGEAL FOLFOX + Nivolumab q21d (changed from q14d on 04/12/23)     12/09/2021 Genetic Testing   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  Report date is 12/09/2021.   The Multi-Cancer + RNA Panel offered by [facility] includes sequencing and/or deletion/duplication analysis of the following 84 genes:  AIP*, ALK, APC*, ATM*, AXIN2*, BAP1*, BARD1*, BLM*, BMPR1A*, BRCA1*, BRCA2*, BRIP1*, CASR, CDC73*, CDH1*, CDK4, CDKN1B*, CDKN1C*, CDKN2A, CEBPA, CHEK2*, CTNNA1*, DICER1*, DIS3L2*, EGFR, EPCAM, FH*, FLCN*, GATA2*, GPC3, GREM1, HOXB13, HRAS, KIT, MAX*, MEN1*, MET, MITF, MLH1*, MSH2*, MSH3*, MSH6*, MUTYH*, NBN*, NF1*, NF2*, NTHL1*, PALB2*, PDGFRA, PHOX2B, PMS2*, POLD1*, POLE*, POT1*, PRKAR1A*, PTCH1*, PTEN*, RAD50*, RAD51C*, RAD51D*, RB1*, RECQL4, RET, RUNX1*, SDHA*, SDHAF2*, SDHB*, SDHC*, SDHD*, SMAD4*, SMARCA4*, SMARCB1*, SMARCE1*, STK11*, SUFU*, TERC, TERT, TMEM127*, Tp53*, TSC1*, TSC2*, VHL*, WRN*, and WT1.  RNA analysis is performed for * genes.     INTERVAL HISTORY:  Mackenzie Lewis is here today for repeat clinical assessment of her stage IVB gastric cancer. Patient states that she feels well but complains of left shoulder pain rating 7-8/10 and arthritic pain. On 05/03/2023 she was found to have an occlusive deep venous thrombosis along the popliteal and peroneal vein and was started on apixaban 10 mg BID for 7 days then apixaban  5 mg BID indefinitely and was discontinued on aspirin 81 mg daily. I explained  to her that she will likely need to stay on anticoagulation since she still has persistent malignancy. Her day 1 cycle 36  of FOLFOX and Nivolumab is scheduled on 05/24/2023 and her day 3 pump D/C cycle 36 is scheduled on 05/26/2023. She now receives this every 3 weeks and notes being more fatigued on this schedule. I will plan weekly IV fluids. She has a WBC of 5.3, hemoglobin of 12.3, and platelet count of 206,000. Her CMP is normal other than a elevated creatinine of 1.28 and low total protein of 6.2. Her thyroid levels are also pending and I will call her with the results. I will plan repeat scans towards the end of May.  [name] back in 3 weeks with CBC, CMP, T4 and TSH, and then [name] back in 6 weeks with CBC, CMP, TSH, T4, and CT chest, abdomen, and pelvis. She denies signs of infection such as sore throat, sinus drainage, cough, or urinary symptoms.  She denies fevers or recurrent chills. She denies nausea, vomiting, chest pain, dyspnea or cough. Her appetite is fair and her weight has increased 1 pounds over last 3 weeks . This patient is accompanied in the office by her daughter.   REVIEW OF SYSTEMS:  Review of Systems  Constitutional:  Negative for appetite change, chills, diaphoresis, fatigue, fever and unexpected weight change.  HENT:  Negative.  Negative for hearing loss, lump/mass, mouth sores, nosebleeds, sore throat, tinnitus, trouble swallowing and voice change.   Eyes: Negative.  Negative for eye problems and icterus.  Respiratory: Negative.  Negative for chest tightness, cough, hemoptysis, shortness of breath and wheezing.   Cardiovascular:  Positive for leg swelling (improved). Negative for chest pain and palpitations.  Gastrointestinal: Negative.  Negative for abdominal distention, abdominal pain, blood in stool, constipation, diarrhea, nausea, rectal pain and vomiting.  Endocrine: Negative.  Negative for hot flashes.  Genitourinary:  Negative for bladder incontinence, difficulty urinating, dyspareunia, dysuria, frequency, hematuria, menstrual problem, nocturia, pelvic pain, vaginal  bleeding and vaginal discharge.   Musculoskeletal:  Positive for arthralgias (right knee, left shoulder rating 7-8/10), back pain (intermittent lower back with exertion) and myalgias (bilateral legs right>left). Negative for flank pain, gait problem, neck pain and neck stiffness.  Skin: Negative.  Negative for itching, rash and wound.  Neurological:  Positive for light-headedness (when stands up quickly) and numbness (neuropathy hands and feet). Negative for dizziness, extremity weakness, gait problem, headaches, seizures and speech difficulty.  Hematological: Negative.  Negative for adenopathy. Does not bruise/bleed easily.  Psychiatric/Behavioral:  Positive for sleep disturbance (associated with shoulder pain). Negative for confusion, decreased concentration, depression and suicidal ideas. The patient is not nervous/anxious.     VITALS:  Blood pressure 138/73, pulse 88, temperature 97.7 F (36.5 C), temperature source Oral, resp. rate 18, height 5\' 4"  (1.626 m), weight 213 lb 4.8 oz (96.8 kg), SpO2 97%.  Wt Readings from Last 3 Encounters:  05/24/23 213 lb 4.8 oz (96.8 kg)  05/03/23 212 lb 9.6 oz (96.4 kg)  04/12/23 210 lb 3.2 oz (95.3 kg)    Body mass index is 36.61 kg/m.  Performance status (ECOG): 1 - Symptomatic but completely ambulatory  PHYSICAL EXAM:  Physical Exam Vitals and nursing note reviewed. Exam conducted with a chaperone present.  Constitutional:      General: She is not in acute distress.    Appearance: Normal appearance. She is  normal weight. She is not ill-appearing, toxic-appearing or diaphoretic.  HENT:     Head: Normocephalic and atraumatic.     Right Ear: Tympanic membrane, ear canal and external ear normal. There is no impacted cerumen.     Left Ear: Tympanic membrane, ear canal and external ear normal. There is no impacted cerumen.     Nose: Nose normal. No congestion or rhinorrhea.     Mouth/Throat:     Mouth: Mucous membranes are moist.     Pharynx:  Oropharynx is clear. No oropharyngeal exudate or posterior oropharyngeal erythema.  Eyes:     General: No scleral icterus.       Right eye: No discharge.        Left eye: No discharge.     Extraocular Movements: Extraocular movements intact.     Conjunctiva/sclera: Conjunctivae normal.     Pupils: Pupils are equal, round, and reactive to light.  Neck:     Vascular: No carotid bruit.  Cardiovascular:     Rate and Rhythm: Normal rate and regular rhythm.     Pulses: Normal pulses.     Heart sounds: Normal heart sounds. No murmur heard.    No friction rub. No gallop.  Pulmonary:     Effort: Pulmonary effort is normal. No respiratory distress.     Breath sounds: Normal breath sounds. No stridor. No wheezing, rhonchi or rales.  Chest:     Chest wall: No tenderness.  Abdominal:     General: Bowel sounds are normal. There is no distension.     Palpations: Abdomen is soft. There is no hepatomegaly, splenomegaly or mass.     Tenderness: There is no abdominal tenderness. There is no right CVA tenderness, left CVA tenderness, guarding or rebound.     Hernia: No hernia is present.     Comments: Firmness on deep palpation on the epigastrium  Musculoskeletal:        General: No swelling, tenderness, deformity or signs of injury. Normal range of motion.     Cervical back: Normal range of motion and neck supple. No rigidity or tenderness.     Right lower leg: No edema.     Left lower leg: No edema.  Lymphadenopathy:     Cervical: No cervical adenopathy.     Right cervical: No superficial, deep or posterior cervical adenopathy.    Left cervical: No superficial, deep or posterior cervical adenopathy.     Upper Body:     Right upper body: No supraclavicular, axillary or pectoral adenopathy.     Left upper body: No supraclavicular, axillary or pectoral adenopathy.     Lower Body: No right inguinal adenopathy. No left inguinal adenopathy.  Skin:    General: Skin is warm and dry.     Coloration:  Skin is not jaundiced or pale.     Findings: No bruising, erythema, lesion or rash.  Neurological:     General: No focal deficit present.     Mental Status: She is alert and oriented to person, place, and time. Mental status is at baseline.     Cranial Nerves: No cranial nerve deficit.     Sensory: No sensory deficit.     Motor: No weakness.     Coordination: Coordination normal.     Gait: Gait normal.     Deep Tendon Reflexes: Reflexes normal.  Psychiatric:        Mood and Affect: Mood normal.        Behavior: Behavior normal.  Thought Content: Thought content normal.        Judgment: Judgment normal.    LABS:      Latest Ref Rng & Units 05/24/2023    8:33 AM 05/03/2023    8:38 AM 04/07/2023    9:43 AM  CBC  WBC 4.0 - 10.5 K/uL 5.3  6.9  8.1   Hemoglobin 12.0 - 15.0 g/dL 96.0  45.4  09.8   Hematocrit 36.0 - 46.0 % 36.1  35.9  36.8   Platelets 150 - 400 K/uL 206  192  188       Latest Ref Rng & Units 05/24/2023    8:33 AM 05/03/2023    8:38 AM 04/14/2023    9:39 AM  CMP  Glucose 70 - 99 mg/dL 119  147  829   BUN 8 - 23 mg/dL 18  26  28    Creatinine 0.44 - 1.00 mg/dL 5.62  1.30  8.65   Sodium 135 - 145 mmol/L 138  138  139   Potassium 3.5 - 5.1 mmol/L 4.0  4.1  4.1   Chloride 98 - 111 mmol/L 107  104  105   CO2 22 - 32 mmol/L 21  22  21    Calcium 8.9 - 10.3 mg/dL 9.2  9.6  9.3   Total Protein 6.5 - 8.1 g/dL 6.2  6.3    Total Bilirubin 0.0 - 1.2 mg/dL 0.4  0.4    Alkaline Phos 38 - 126 U/L 99  101    AST 15 - 41 U/L 29  29    ALT 0 - 44 U/L 27  29     Lab Results  Component Value Date   CEA1 2.9 09/10/2021   /  CEA  Date Value Ref Range Status  09/10/2021 2.9 0.0 - 4.7 ng/mL Final    Comment:    (NOTE)                             Nonsmokers          <3.9                             Smokers             <5.6 [employer] Electrochemiluminescence Immunoassay (ECLIA) Values obtained with different assay methods or kits cannot be used interchangeably.   Results cannot be interpreted as absolute evidence of the presence or absence of malignant disease. Performed At: [facility] [address] [place], [place] [id number] Pearlean Botts MD MW:[phone]    STUDIES:  US  Venous Img Lower Unilateral Right (DVT) Result Date: 05/03/2023 CLINICAL DATA:  Increased swelling, calf tenderness. History of gastric cancer EXAM: Right LOWER EXTREMITY VENOUS DOPPLER ULTRASOUND TECHNIQUE: Gray-scale sonography with graded compression, as well as color Doppler and duplex ultrasound were performed to evaluate the lower extremity deep venous systems from the level of the common femoral vein and including the common femoral, femoral, profunda femoral, popliteal and calf veins including the posterior tibial, peroneal and gastrocnemius veins when visible. The superficial great saphenous vein was also interrogated. Spectral Doppler was utilized to evaluate flow at rest and with distal augmentation maneuvers in the common femoral, femoral and popliteal veins. COMPARISON:  None Available. FINDINGS: Contralateral Common Femoral Vein: Respiratory phasicity is normal and symmetric with the symptomatic side. No evidence of thrombus. Normal compressibility. Common Femoral Vein: No evidence of thrombus.  Normal compressibility, respiratory phasicity and response to augmentation. Saphenofemoral Junction: No evidence of thrombus. Normal compressibility and flow on color Doppler imaging. Profunda Femoral Vein: No evidence of thrombus. Normal compressibility and flow on color Doppler imaging. Femoral Vein: No evidence of thrombus. Normal compressibility and flow on color Doppler imaging. Popliteal Vein: Echogenic material seen with poor flow on Doppler. Calf Veins: Peroneal vein thrombus as well. Superficial Great Saphenous Vein: No evidence of thrombus. Normal compressibility. Venous Reflux:  None. Other Findings: Complex cystic area identified popliteal fossa measuring 6.5 x 1.4 by  2.6 cm. IMPRESSION: Occlusive thrombus along the popliteal vein and peroneal vein. Critical Value/emergent results were called by telephone at the time of interpretation on 05/03/2023 at 1:44 pm to provider [facility] , who verbally acknowledged these results. Electronically Signed   By: Adrianna Horde M.D.   On: 05/03/2023 13:44    EXAM: 03/07/2023 CT CHEST, ABDOMEN, AND PELVIS WITH CONTRAST IMPRESSION: 1. Similar mild distal gastric wall thickening with perigastric fat stranding/soft tissue thickening. No new suspicious finding. 2. No evidence of metastatic disease within the chest, abdomen, or pelvis. 3. Stable small solid pulmonary nodules in the left lung base measuring up to 6 mm, favored benign. 4. Diffuse hepatic steatosis. 5.  Aortic Atherosclerosis (ICD10-I70.0).  EXAM: 03/07/2023 DIGITAL SCREENING BILATERAL MAMMOGRAM WITH TOMOSYNTHESIS AND CAD IMPRESSION: No mammographic evidence of malignancy. A result letter of this screening mammogram will be mailed directly to the patient.  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  If that happens, she is to call us  so that we can schedule her for an ultrasound to look for synovitis.     Essential hypertension 09/09/2016   Gastric cancer (HCC) 09/10/2021   Hyperlipidemia 09/01/2016   Hypertension    Hypothyroidism 09/01/2016   Osteoarthritis of both feet 01/20/2016    Osteoarthritis, hand 01/20/2016   Thyroid disease     Past Surgical History:  Procedure Laterality Date   APPENDECTOMY     LAPAROSCOPIC APPENDECTOMY N/A 04/10/2016   Procedure: APPENDECTOMY LAPAROSCOPIC;  Surgeon: Oza Blumenthal, MD;  Location: [facility] OR;  Service: General;  Laterality: N/A;    Family History  Problem Relation Age of Onset   Hypertension Mother    Prostate cancer Father        metastatic; d. 4   Brain cancer Sister 34   Breast cancer Sister 54   AAA (abdominal aortic aneurysm) Brother    Leukemia Cousin        x2 maternal female cousins; d. before 82   Breast cancer Daughter 65       DCIS    Social History:  reports that she has never smoked. She has never used smokeless tobacco. She reports that she does not currently use alcohol. She reports that she does not use drugs.The patient is accompanied by her daughter today.  Allergies:  Allergies  Allergen Reactions   Doxycycline Rash   Sulfa Antibiotics Rash and Other (  See Comments)    Other reaction(s): Other (See Comments)  "Made me feel weird"    Current Medications: Current Outpatient Medications  Medication Sig Dispense Refill   amLODipine (NORVASC) 5 MG tablet Take 5 mg by mouth daily.     apixaban (ELIQUIS) 5 MG TABS tablet Take 1 tablet (5 mg total) by mouth 2 (two) times daily. 180 tablet 1   b complex vitamins capsule Take 1 capsule by mouth daily.     Calcium Carbonate (CALCIUM 600 PO) Take 1 tablet by mouth daily.     Cholecalciferol (VITAMIN D3) 5000 units CAPS Take 1 capsule by mouth daily.     famotidine (PEPCID) 40 MG tablet Take 40 mg by mouth at bedtime.     hydrochlorothiazide (HYDRODIURIL) 12.5 MG tablet Take 12.5 mg by mouth daily.     KRILL OIL PO Take 1 capsule by mouth daily. Unknown strenght     Lactobacillus TABS Take 1 tablet by mouth 2 (two) times daily.     levothyroxine (SYNTHROID, LEVOTHROID) 75 MCG tablet Take 75 mcg by mouth daily before breakfast.      LORazepam (ATIVAN)  0.5 MG tablet Take 1 tablet (0.5 mg total) by mouth every 8 (eight) hours. 30 tablet 0   NON FORMULARY Take 1 Dose by mouth See admin instructions. MMW: 3 parts Maalox 2 parts Benadryl 1 part viscious lidicaine  Disp. 6oz  Instructions: 5ml swish and swallow every 3-4 hours     omeprazole (PRILOSEC) 40 MG capsule Take 1 capsule (40 mg total) by mouth 2 (two) times daily. 60 capsule 5   ondansetron (ZOFRAN-ODT) 4 MG disintegrating tablet Take 1 tablet (4 mg total) by mouth every 8 (eight) hours as needed for nausea or vomiting. 90 tablet 0   polyethylene glycol powder (GLYCOLAX/MIRALAX) 17 GM/SCOOP powder Take 1 Container by mouth daily.     potassium chloride SA (KLOR-CON M) 20 MEQ tablet TAKE 1 TABLET TWICE A DAY 180 tablet 3   pravastatin (PRAVACHOL) 20 MG tablet Take 1 tablet (20 mg total) by mouth every evening. 90 tablet 3   Probiotic Product (PROBIOTIC DAILY PO) Take 1 tablet by mouth daily.     prochlorperazine (COMPAZINE) 10 MG tablet Take 1 tablet (10 mg total) by mouth every 6 (six) hours as needed for nausea or vomiting. 90 tablet 3   valsartan (DIOVAN) 160 MG tablet Take 160 mg by mouth daily.     No current facility-administered medications for this visit.   I,Jasmine M Lassiter,acting as a scribe for Nolia Baumgartner, MD.,have documented all relevant documentation on the behalf of Nolia Baumgartner, MD,as directed by  Nolia Baumgartner, MD while in the presence of Nolia Baumgartner, MD.

## 2023-05-25 ENCOUNTER — Other Ambulatory Visit: Payer: Self-pay

## 2023-05-25 LAB — T4: T4, Total: 9.9 ug/dL (ref 4.5–12.0)

## 2023-05-26 ENCOUNTER — Inpatient Hospital Stay: Payer: Medicare Other

## 2023-05-26 VITALS — BP 127/49 | HR 87 | Temp 97.9°F | Resp 18

## 2023-05-26 DIAGNOSIS — C168 Malignant neoplasm of overlapping sites of stomach: Secondary | ICD-10-CM

## 2023-05-26 DIAGNOSIS — Z7901 Long term (current) use of anticoagulants: Secondary | ICD-10-CM | POA: Diagnosis not present

## 2023-05-26 DIAGNOSIS — Z86718 Personal history of other venous thrombosis and embolism: Secondary | ICD-10-CM | POA: Diagnosis not present

## 2023-05-26 DIAGNOSIS — C169 Malignant neoplasm of stomach, unspecified: Secondary | ICD-10-CM | POA: Diagnosis not present

## 2023-05-26 DIAGNOSIS — Z79899 Other long term (current) drug therapy: Secondary | ICD-10-CM | POA: Diagnosis not present

## 2023-05-26 DIAGNOSIS — Z5111 Encounter for antineoplastic chemotherapy: Secondary | ICD-10-CM | POA: Diagnosis not present

## 2023-05-26 MED ORDER — SODIUM CHLORIDE 0.9 % IV SOLN: Freq: Once | INTRAVENOUS | Status: AC

## 2023-05-26 MED ORDER — HEPARIN SOD (PORK) LOCK FLUSH 100 UNIT/ML IV SOLN
500.0000 [IU] | Freq: Once | INTRAVENOUS | Status: AC | PRN
Start: 2023-05-26 — End: 2023-05-26
  Administered 2023-05-26: 500 [IU]

## 2023-05-26 MED ORDER — SODIUM CHLORIDE 0.9% FLUSH
10.0000 mL | INTRAVENOUS | Status: DC | PRN
Start: 2023-05-26 — End: 2023-05-26
  Administered 2023-05-26: 10 mL

## 2023-05-31 ENCOUNTER — Encounter: Payer: Self-pay | Admitting: Oncology

## 2023-05-31 ENCOUNTER — Inpatient Hospital Stay

## 2023-05-31 VITALS — BP 146/60 | HR 99 | Temp 97.8°F | Resp 18

## 2023-05-31 DIAGNOSIS — Z79899 Other long term (current) drug therapy: Secondary | ICD-10-CM | POA: Diagnosis not present

## 2023-05-31 DIAGNOSIS — E876 Hypokalemia: Secondary | ICD-10-CM

## 2023-05-31 DIAGNOSIS — R7989 Other specified abnormal findings of blood chemistry: Secondary | ICD-10-CM

## 2023-05-31 DIAGNOSIS — C169 Malignant neoplasm of stomach, unspecified: Secondary | ICD-10-CM | POA: Diagnosis not present

## 2023-05-31 DIAGNOSIS — E860 Dehydration: Secondary | ICD-10-CM

## 2023-05-31 DIAGNOSIS — Z7901 Long term (current) use of anticoagulants: Secondary | ICD-10-CM | POA: Diagnosis not present

## 2023-05-31 DIAGNOSIS — Z86718 Personal history of other venous thrombosis and embolism: Secondary | ICD-10-CM | POA: Diagnosis not present

## 2023-05-31 DIAGNOSIS — Z5111 Encounter for antineoplastic chemotherapy: Secondary | ICD-10-CM | POA: Diagnosis not present

## 2023-05-31 MED ORDER — HEPARIN SOD (PORK) LOCK FLUSH 100 UNIT/ML IV SOLN
250.0000 [IU] | Freq: Once | INTRAVENOUS | Status: AC | PRN
  Administered 2023-05-31: 500 [IU]

## 2023-05-31 MED ORDER — SODIUM CHLORIDE 0.9% FLUSH
3.0000 mL | Freq: Once | INTRAVENOUS | Status: AC | PRN
  Administered 2023-05-31: 10 mL

## 2023-05-31 MED ORDER — SODIUM CHLORIDE 0.9 % IV SOLN: INTRAVENOUS | Status: DC

## 2023-05-31 NOTE — Patient Instructions (Signed)

## 2023-06-01 ENCOUNTER — Encounter: Payer: Self-pay | Admitting: Oncology

## 2023-06-07 ENCOUNTER — Inpatient Hospital Stay

## 2023-06-07 ENCOUNTER — Encounter: Payer: Self-pay | Admitting: Oncology

## 2023-06-07 VITALS — BP 133/71 | HR 95 | Temp 98.3°F | Resp 16 | Ht 64.0 in | Wt 214.8 lb

## 2023-06-07 DIAGNOSIS — R7989 Other specified abnormal findings of blood chemistry: Secondary | ICD-10-CM

## 2023-06-07 DIAGNOSIS — E860 Dehydration: Secondary | ICD-10-CM

## 2023-06-07 DIAGNOSIS — C169 Malignant neoplasm of stomach, unspecified: Secondary | ICD-10-CM | POA: Diagnosis not present

## 2023-06-07 DIAGNOSIS — Z86718 Personal history of other venous thrombosis and embolism: Secondary | ICD-10-CM | POA: Diagnosis not present

## 2023-06-07 DIAGNOSIS — Z7901 Long term (current) use of anticoagulants: Secondary | ICD-10-CM | POA: Diagnosis not present

## 2023-06-07 DIAGNOSIS — Z5111 Encounter for antineoplastic chemotherapy: Secondary | ICD-10-CM | POA: Diagnosis not present

## 2023-06-07 DIAGNOSIS — E876 Hypokalemia: Secondary | ICD-10-CM

## 2023-06-07 DIAGNOSIS — Z79899 Other long term (current) drug therapy: Secondary | ICD-10-CM | POA: Diagnosis not present

## 2023-06-07 MED ORDER — HEPARIN SOD (PORK) LOCK FLUSH 100 UNIT/ML IV SOLN
500.0000 [IU] | Freq: Once | INTRAVENOUS | Status: AC | PRN
  Administered 2023-06-07: 500 [IU]

## 2023-06-07 MED ORDER — SODIUM CHLORIDE 0.9% FLUSH
10.0000 mL | Freq: Once | INTRAVENOUS | Status: AC | PRN
  Administered 2023-06-07: 10 mL

## 2023-06-07 MED ORDER — SODIUM CHLORIDE 0.9 % IV SOLN: Freq: Once | INTRAVENOUS | Status: AC

## 2023-06-07 NOTE — Patient Instructions (Signed)
 Dehydration, Adult Dehydration is a condition in which there is not enough water or other fluids in the body. This happens when a person loses more fluids than they take in. Important organs cannot work right without the right amount of fluids. Any loss of fluids from the body can cause dehydration. Dehydration can be mild, worse, or very bad. It should be treated right away to keep it from getting very bad. What are the causes? Conditions that cause loss of water in the body. They include: Watery poop (diarrhea). Vomiting. Sweating a lot. Fever. Infection. Peeing (urinating) a lot. Not drinking enough fluids. Certain medicines, such as medicines that take extra fluid out of the body (diuretics). Lack of safe drinking water. Not being able to get enough water and food. What increases the risk? Having a long-term (chronic) illness that has not been treated the right way, such as: Diabetes. Heart disease. Kidney disease. Being 25 years of age or older. Having a disability. Living in a place that is high above the ground or sea (high in altitude). The thinner, drier air causes more fluid loss. Doing exercises that put stress on your body for a long time. Being active when in hot places. What are the signs or symptoms? Symptoms of dehydration depend on how bad it is. Mild or worse dehydration Thirst. Dry lips or dry mouth. Feeling dizzy or light-headed. Muscle cramps. Passing little pee or dark pee. Pee may be the color of tea. Headache. Very bad dehydration Changes in skin. Skin may: Be cold to the touch (clammy). Be blotchy or pale. Not go back to normal right after you pinch it and let it go. Little or no tears, pee, or sweat. Fast breathing. Low blood pressure. Weak pulse. Pulse that is more than 100 beats a minute when you are sitting still. Other changes, such as: Feeling very thirsty. Eyes that look hollow (sunken). Cold hands and feet. Being confused. Being very  tired (lethargic) or having trouble waking from sleep. Losing weight. Loss of consciousness. How is this treated? Treatment for this condition depends on how bad your dehydration is. Treatment should start right away. Do not wait until your condition gets very bad. Very bad dehydration is an emergency. You will need to go to a hospital. Mild or worse dehydration can be treated at home. You may be asked to: Drink more fluids. Drink an oral rehydration solution (ORS). This drink gives you the right amount of fluids, salts, and minerals (electrolytes). Very bad dehydration can be treated: With fluids through an IV tube. By correcting low levels of electrolytes in the body. By treating the problem that caused your dehydration. Follow these instructions at home: Oral rehydration solution If told by your doctor, drink an ORS: Make an ORS. Use instructions on the package. Start by drinking small amounts, about  cup (120 mL) every 5-10 minutes. Slowly drink more until you have had the amount that your doctor said to have.  Eating and drinking  Drink enough clear fluid to keep your pee pale yellow. If you were told to drink an ORS, finish the ORS first. Then, start slowly drinking other clear fluids. Drink fluids such as: Water. Do not drink only water. Doing that can make the salt (sodium) level in your body get too low. Water from ice chips you suck on. Fruit juice that you have added water to (diluted). Low-calorie sports drinks. Eat foods that have the right amounts of salts and minerals, such as bananas, oranges, potatoes,  tomatoes, or spinach. Do not drink alcohol. Avoid drinks that have caffeine or sugar. These include:: High-calorie sports drinks. Fruit juice that you did not add water to. Soda. Coffee or energy drinks. Avoid foods that are greasy or have a lot of fat or sugar. General instructions Take over-the-counter and prescription medicines only as told by your doctor. Do  not take sodium tablets. Doing that can make the salt level in your body get too high. Return to your normal activities as told by your doctor. Ask your doctor what activities are safe for you. Keep all follow-up visits. Your doctor may check and change your treatment. Contact a doctor if: You have pain in your belly (abdomen) and the pain: Gets worse. Stays in one place. You have a rash. You have a stiff neck. You get angry or annoyed more easily than normal. You are more tired or have a harder time waking than normal. You feel weak or dizzy. You feel very thirsty. Get help right away if: You have any symptoms of very bad dehydration. You vomit every time you eat or drink. Your vomiting gets worse, does not go away, or you vomit blood or green stuff. You are getting treatment, but symptoms are getting worse. You have a fever. You have a very bad headache. You have: Diarrhea that gets worse or does not go away. Blood in your poop (stool). This may cause poop to look black and tarry. No pee in 6-8 hours. Only a small amount of pee in 6-8 hours, and the pee is very dark. You have trouble breathing. These symptoms may be an emergency. Get help right away. Call 911. Do not wait to see if the symptoms will go away. Do not drive yourself to the hospital. This information is not intended to replace advice given to you by your health care provider. Make sure you discuss any questions you have with your health care provider. Document Revised: 08/31/2021 Document Reviewed: 08/31/2021 Elsevier Patient Education  2024 [employer].

## 2023-06-08 ENCOUNTER — Ambulatory Visit (INDEPENDENT_AMBULATORY_CARE_PROVIDER_SITE_OTHER): Payer: Medicare Other | Admitting: Podiatry

## 2023-06-08 ENCOUNTER — Telehealth: Payer: Self-pay

## 2023-06-08 DIAGNOSIS — M79674 Pain in right toe(s): Secondary | ICD-10-CM

## 2023-06-08 DIAGNOSIS — B351 Tinea unguium: Secondary | ICD-10-CM

## 2023-06-08 DIAGNOSIS — M79675 Pain in left toe(s): Secondary | ICD-10-CM | POA: Diagnosis not present

## 2023-06-08 DIAGNOSIS — I890 Lymphedema, not elsewhere classified: Secondary | ICD-10-CM | POA: Diagnosis not present

## 2023-06-08 NOTE — Progress Notes (Signed)
 Subjective:  Patient ID: Mackenzie Lewis, female    DOB: [birth year],  MRN: [id number]  Mackenzie Lewis presents to clinic today for:  Chief Complaint  Patient presents with   [facility]    RFC with out callous. Not diabetic and is now taking Eliquis . There is a lot of swelling in her feet and ankles. She is going to make apt with PCP.    Patient notes nails are thick, discolored, elongated and painful in shoegear when trying to ambulate.  Patient has been having issues with swelling in her lower legs.  She feels that her neuropathy aggravates the swelling, or vice versa.  As she cannot tolerate compression stockings as she cannot get these on or off at home on her own.  She does have a history of DVT in the right lower extremity and states that this leg typically swells up more than the left leg.  She is also undergoing cancer treatment at this time.  She is on a 2-week hiatus from treatment and is sent for a new CT scan to evaluate whether she needs to continue with her current treatment or begin a different treatment  PCP is Mackenzie Bering, Mackenzie Lewis.  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  If that happens, she is to call us  so that we can schedule her for an ultrasound to look for synovitis.     Essential hypertension 09/09/2016   Gastric cancer (HCC) 09/10/2021   Hyperlipidemia 09/01/2016   Hypertension    Hypothyroidism 09/01/2016    Osteoarthritis of both feet 01/20/2016   Osteoarthritis, hand 01/20/2016   Thyroid  disease     Past Surgical History:  Procedure Laterality Date   APPENDECTOMY     LAPAROSCOPIC APPENDECTOMY N/A 04/10/2016   Procedure: APPENDECTOMY LAPAROSCOPIC;  Surgeon: Oza Blumenthal, Mackenzie Lewis;  Location: [facility] OR;  Service: General;  Laterality: N/A;    Allergies  Allergen Reactions   Doxycycline Rash   Sulfa Antibiotics Rash and Other (See Comments)    Other reaction(s): Other (See Comments)  "Made me feel weird"    Review of Systems: Negative except as noted in the HPI.  Objective:  Mackenzie Lewis is a pleasant 88 y.o. female in NAD. AAO x 3.  Vascular Examination: Capillary refill time is 3-5 seconds to toes bilateral.  1/4 palpable pedal pulses b/l LE. Digital hair sparse b/l.  Skin temperature gradient WNL b/l.  Positive telangiectasias bilateral legs and ankles.  +2 pitting edema bilateral legs and ankles and dorsal foot.  Right ankle circumference is 29 cm and right calf circumference is 40 cm.  Left ankle circumference is 31 cm and left calf circumference is 41.5 cm  Dermatological Examination: Pedal skin with decreased turgor, texture and tone b/l. No open wounds. No interdigital macerations b/l. Toenails x10 are 3mm thick, discolored, dystrophic with subungual debris. There is pain with compression of the nail plates.  They are elongated x10  Assessment/Plan: 1.  Pain due to onychomycosis of toenails of both feet   2. Lymphedema of both lower extremities    FOR HOME USE ONLY DME OTHER SEE COMMENT-order placed for home bilateral lower extremity lymphedema compression pumps.  Will contact our local representative to see about getting this covered by her insurance and helping her get this set up at home and customized for her daily use.  The mycotic toenails were sharply debrided x10 with sterile nail nippers and a power debriding burr to decrease bulk/thickness and length.    Return in  about 3 months (around 09/07/2023) for RFC.   Mackenzie Lewis, DPM, FACFAS [facility]     [address] [place], [place] [address]                Office [phone]  Fax [phone]

## 2023-06-08 NOTE — Telephone Encounter (Signed)
 I have a compression pump patient for you  Mackenzie Lewis  DOB Jan 23, 1936  Dr. Wyn Heater

## 2023-06-09 ENCOUNTER — Other Ambulatory Visit: Payer: Self-pay

## 2023-06-11 ENCOUNTER — Other Ambulatory Visit: Payer: Self-pay | Admitting: Cardiology

## 2023-06-13 ENCOUNTER — Other Ambulatory Visit: Payer: Self-pay | Admitting: Hematology and Oncology

## 2023-06-13 DIAGNOSIS — C168 Malignant neoplasm of overlapping sites of stomach: Secondary | ICD-10-CM

## 2023-06-14 ENCOUNTER — Inpatient Hospital Stay

## 2023-06-14 ENCOUNTER — Other Ambulatory Visit: Payer: Self-pay

## 2023-06-14 ENCOUNTER — Encounter: Payer: Self-pay | Admitting: Hematology and Oncology

## 2023-06-14 ENCOUNTER — Inpatient Hospital Stay (HOSPITAL_BASED_OUTPATIENT_CLINIC_OR_DEPARTMENT_OTHER): Admitting: Hematology and Oncology

## 2023-06-14 VITALS — BP 163/79 | HR 83 | Temp 98.2°F | Resp 18 | Ht 64.0 in | Wt 212.1 lb

## 2023-06-14 DIAGNOSIS — C169 Malignant neoplasm of stomach, unspecified: Secondary | ICD-10-CM | POA: Diagnosis not present

## 2023-06-14 DIAGNOSIS — C168 Malignant neoplasm of overlapping sites of stomach: Secondary | ICD-10-CM

## 2023-06-14 DIAGNOSIS — Z5111 Encounter for antineoplastic chemotherapy: Secondary | ICD-10-CM | POA: Diagnosis not present

## 2023-06-14 DIAGNOSIS — Z79899 Other long term (current) drug therapy: Secondary | ICD-10-CM | POA: Diagnosis not present

## 2023-06-14 DIAGNOSIS — Z86718 Personal history of other venous thrombosis and embolism: Secondary | ICD-10-CM | POA: Diagnosis not present

## 2023-06-14 DIAGNOSIS — Z7901 Long term (current) use of anticoagulants: Secondary | ICD-10-CM | POA: Diagnosis not present

## 2023-06-14 LAB — CBC WITH DIFFERENTIAL (CANCER CENTER ONLY)
Abs Immature Granulocytes: 0.01 10*3/uL (ref 0.00–0.07)
Basophils Absolute: 0 10*3/uL (ref 0.0–0.1)
Basophils Relative: 1 %
Eosinophils Absolute: 0.2 10*3/uL (ref 0.0–0.5)
Eosinophils Relative: 4 %
HCT: 36.8 % (ref 36.0–46.0)
Hemoglobin: 12.2 g/dL (ref 12.0–15.0)
Immature Granulocytes: 0 %
Lymphocytes Relative: 38 %
Lymphs Abs: 2.1 10*3/uL (ref 0.7–4.0)
MCH: 28.9 pg (ref 26.0–34.0)
MCHC: 33.2 g/dL (ref 30.0–36.0)
MCV: 87.2 fL (ref 80.0–100.0)
Monocytes Absolute: 0.7 10*3/uL (ref 0.1–1.0)
Monocytes Relative: 12 %
Neutro Abs: 2.5 10*3/uL (ref 1.7–7.7)
Neutrophils Relative %: 45 %
Platelet Count: 205 10*3/uL (ref 150–400)
RBC: 4.22 MIL/uL (ref 3.87–5.11)
RDW: 14.6 % (ref 11.5–15.5)
WBC Count: 5.6 10*3/uL (ref 4.0–10.5)
nRBC: 0 % (ref 0.0–0.2)
nRBC: 0 /100{WBCs}

## 2023-06-14 LAB — CMP (CANCER CENTER ONLY)
ALT: 26 U/L (ref 0–44)
AST: 28 U/L (ref 15–41)
Albumin: 4.4 g/dL (ref 3.5–5.0)
Alkaline Phosphatase: 107 U/L (ref 38–126)
Anion gap: 11 (ref 5–15)
BUN: 22 mg/dL (ref 8–23)
CO2: 23 mmol/L (ref 22–32)
Calcium: 9.4 mg/dL (ref 8.9–10.3)
Chloride: 107 mmol/L (ref 98–111)
Creatinine: 1.24 mg/dL — ABNORMAL HIGH (ref 0.44–1.00)
GFR, Estimated: 42 mL/min — ABNORMAL LOW (ref >60)
Glucose, Bld: 107 mg/dL — ABNORMAL HIGH (ref 70–99)
Potassium: 4.4 mmol/L (ref 3.5–5.1)
Sodium: 141 mmol/L (ref 135–145)
Total Bilirubin: 0.4 mg/dL (ref 0.0–1.2)
Total Protein: 6.4 g/dL — ABNORMAL LOW (ref 6.5–8.1)

## 2023-06-14 LAB — TSH: TSH: 1.92 u[IU]/mL (ref 0.350–4.500)

## 2023-06-14 MED ORDER — SODIUM CHLORIDE 0.9 % IV SOLN: Freq: Once | INTRAVENOUS | Status: AC

## 2023-06-14 MED ORDER — FLUOROURACIL CHEMO INJECTION 2.5 GM/50ML
320.0000 mg/m2 | Freq: Once | INTRAVENOUS | Status: AC
  Administered 2023-06-14: 650 mg via INTRAVENOUS
  Filled 2023-06-14: qty 13

## 2023-06-14 MED ORDER — DEXAMETHASONE SODIUM PHOSPHATE 10 MG/ML IJ SOLN
10.0000 mg | Freq: Once | INTRAMUSCULAR | Status: AC
  Administered 2023-06-14: 10 mg via INTRAVENOUS
  Filled 2023-06-14: qty 1

## 2023-06-14 MED ORDER — SODIUM CHLORIDE 0.9 % IV SOLN
240.0000 mg | Freq: Once | INTRAVENOUS | Status: AC
  Administered 2023-06-14: 240 mg via INTRAVENOUS
  Filled 2023-06-14: qty 24

## 2023-06-14 MED ORDER — SODIUM CHLORIDE 0.9 % IV SOLN
1960.0000 mg/m2 | INTRAVENOUS | Status: DC
  Administered 2023-06-14: 3850 mg via INTRAVENOUS
  Filled 2023-06-14: qty 77

## 2023-06-14 MED ORDER — LEUCOVORIN CALCIUM INJECTION 350 MG
320.0000 mg/m2 | Freq: Once | INTRAVENOUS | Status: AC
  Administered 2023-06-14: 630 mg via INTRAVENOUS
  Filled 2023-06-14: qty 31.5

## 2023-06-14 MED ORDER — PALONOSETRON HCL INJECTION 0.25 MG/5ML
0.2500 mg | Freq: Once | INTRAVENOUS | Status: AC
  Administered 2023-06-14: 0.25 mg via INTRAVENOUS
  Filled 2023-06-14: qty 5

## 2023-06-14 NOTE — Patient Instructions (Signed)
Fluorouracil Injection What is this medication? FLUOROURACIL (flure oh YOOR a sil) treats some types of cancer. It works by slowing down the growth of cancer cells. This medicine may be used for other purposes; ask your health care provider or pharmacist if you have questions. COMMON BRAND NAME(S): Adrucil What should I tell my care team before I take this medication? They need to know if you have any of these conditions: Blood disorders Dihydropyrimidine dehydrogenase (DPD) deficiency Infection, such as chickenpox, cold sores, herpes Kidney disease Liver disease Poor nutrition Recent or ongoing radiation therapy An unusual or allergic reaction to fluorouracil, other medications, foods, dyes, or preservatives If you or your partner are pregnant or trying to get pregnant Breast-feeding How should I use this medication? This medication is injected into a vein. It is administered by your care team in a hospital or clinic setting. Talk to your care team about the use of this medication in children. Special care may be needed. Overdosage: If you think you have taken too much of this medicine contact a poison control center or emergency room at once. NOTE: This medicine is only for you. Do not share this medicine with others. What if I miss a dose? Keep appointments for follow-up doses. It is important not to miss your dose. Call your care team if you are unable to keep an appointment. What may interact with this medication? Do not take this medication with any of the following: Live virus vaccines This medication may also interact with the following: Medications that treat or prevent blood clots, such as warfarin, enoxaparin, dalteparin This list may not describe all possible interactions. Give your health care provider a list of all the medicines, herbs, non-prescription drugs, or dietary supplements you use. Also tell them if you smoke, drink alcohol, or use illegal drugs. Some items may  interact with your medicine. What should I watch for while using this medication? Your condition will be monitored carefully while you are receiving this medication. This medication may make you feel generally unwell. This is not uncommon as chemotherapy can affect healthy cells as well as cancer cells. Report any side effects. Continue your course of treatment even though you feel ill unless your care team tells you to stop. In some cases, you may be given additional medications to help with side effects. Follow all directions for their use. This medication may increase your risk of getting an infection. Call your care team for advice if you get a fever, chills, sore throat, or other symptoms of a cold or flu. Do not treat yourself. Try to avoid being around people who are sick. This medication may increase your risk to bruise or bleed. Call your care team if you notice any unusual bleeding. Be careful brushing or flossing your teeth or using a toothpick because you may get an infection or bleed more easily. If you have any dental work done, tell your dentist you are receiving this medication. Avoid taking medications that contain aspirin, acetaminophen, ibuprofen, naproxen, or ketoprofen unless instructed by your care team. These medications may hide a fever. Do not treat diarrhea with over the counter products. Contact your care team if you have diarrhea that lasts more than 2 days or if it is severe and watery. This medication can make you more sensitive to the sun. Keep out of the sun. If you cannot avoid being in the sun, wear protective clothing and sunscreen. Do not use sun lamps, tanning beds, or tanning booths. Talk to   your care team if you or your partner wish to become pregnant or think you might be pregnant. This medication can cause serious birth defects if taken during pregnancy and for 3 months after the last dose. A reliable form of contraception is recommended while taking this  medication and for 3 months after the last dose. Talk to your care team about effective forms of contraception. Do not father a child while taking this medication and for 3 months after the last dose. Use a condom while having sex during this time period. Do not breastfeed while taking this medication. This medication may cause infertility. Talk to your care team if you are concerned about your fertility. What side effects may I notice from receiving this medication? Side effects that you should report to your care team as soon as possible: Allergic reactions--skin rash, itching, hives, swelling of the face, lips, tongue, or throat Heart attack--pain or tightness in the chest, shoulders, arms, or jaw, nausea, shortness of breath, cold or clammy skin, feeling faint or lightheaded Heart failure--shortness of breath, swelling of the ankles, feet, or hands, sudden weight gain, unusual weakness or fatigue Heart rhythm changes--fast or irregular heartbeat, dizziness, feeling faint or lightheaded, chest pain, trouble breathing High ammonia level--unusual weakness or fatigue, confusion, loss of appetite, nausea, vomiting, seizures Infection--fever, chills, cough, sore throat, wounds that don't heal, pain or trouble when passing urine, general feeling of discomfort or being unwell Low red blood cell level--unusual weakness or fatigue, dizziness, headache, trouble breathing Pain, tingling, or numbness in the hands or feet, muscle weakness, change in vision, confusion or trouble speaking, loss of balance or coordination, trouble walking, seizures Redness, swelling, and blistering of the skin over hands and feet Severe or prolonged diarrhea Unusual bruising or bleeding Side effects that usually do not require medical attention (report to your care team if they continue or are bothersome): Dry skin Headache Increased tears Nausea Pain, redness, or swelling with sores inside the mouth or throat Sensitivity  to light Vomiting This list may not describe all possible side effects. Call your doctor for medical advice about side effects. You may report side effects to FDA at 1-800-FDA-1088. Where should I keep my medication? This medication is given in a hospital or clinic. It will not be stored at home. NOTE: This sheet is a summary. It may not cover all possible information. If you have questions about this medicine, talk to your doctor, pharmacist, or health care provider.  2024 [employer] Standard (2021-06-09 00:00:00) Leucovorin Injection What is this medication? LEUCOVORIN (loo koe VOR in) prevents side effects from certain medications, such as methotrexate. It works by increasing folate levels. This helps protect healthy cells in your body. It may also be used to treat anemia caused by low levels of folate. It can also be used with fluorouracil, a type of chemotherapy, to treat colorectal cancer. It works by increasing the effects of fluorouracil in the body. This medicine may be used for other purposes; ask your health care provider or pharmacist if you have questions. What should I tell my care team before I take this medication? They need to know if you have any of these conditions: Anemia from low levels of vitamin B12 in the blood An unusual or allergic reaction to leucovorin, folic acid, other medications, foods, dyes, or preservatives Pregnant or trying to get pregnant Breastfeeding How should I use this medication? This medication is injected into a vein or a muscle. It is given by your care team   in a hospital or clinic setting. Talk to your care team about the use of this medication in children. Special care may be needed. Overdosage: If you think you have taken too much of this medicine contact a poison control center or emergency room at once. NOTE: This medicine is only for you. Do not share this medicine with others. What if I miss a dose? Keep appointments for follow-up doses.  It is important not to miss your dose. Call your care team if you are unable to keep an appointment. What may interact with this medication? Capecitabine Fluorouracil Phenobarbital Phenytoin Primidone Trimethoprim;sulfamethoxazole This list may not describe all possible interactions. Give your health care provider a list of all the medicines, herbs, non-prescription drugs, or dietary supplements you use. Also tell them if you smoke, drink alcohol, or use illegal drugs. Some items may interact with your medicine. What should I watch for while using this medication? Your condition will be monitored carefully while you are receiving this medication. This medication may increase the side effects of 5-fluorouracil. Tell your care team if you have diarrhea or mouth sores that do not get better or that get worse. What side effects may I notice from receiving this medication? Side effects that you should report to your care team as soon as possible: Allergic reactions--skin rash, itching, hives, swelling of the face, lips, tongue, or throat This list may not describe all possible side effects. Call your doctor for medical advice about side effects. You may report side effects to FDA at 1-800-FDA-1088. Where should I keep my medication? This medication is given in a hospital or clinic. It will not be stored at home. NOTE: This sheet is a summary. It may not cover all possible information. If you have questions about this medicine, talk to your doctor, pharmacist, or health care provider.  2024 [employer] Standard (2021-07-07 00:00:00) Nivolumab Injection What is this medication? NIVOLUMAB (nye VOL ue mab) treats some types of cancer. It works by helping your immune system slow or stop the spread of cancer cells. It is a monoclonal antibody. This medicine may be used for other purposes; ask your health care provider or pharmacist if you have questions. COMMON BRAND NAME(S): Opdivo What should I  tell my care team before I take this medication? They need to know if you have any of these conditions: Allogeneic stem cell transplant (uses someone else's stem cells) Autoimmune diseases, such as Crohn disease, ulcerative colitis, lupus History of chest radiation Nervous system problems, such as Guillain-Barre syndrome or myasthenia gravis Organ transplant An unusual or allergic reaction to nivolumab, other medications, foods, dyes, or preservatives Pregnant or trying to get pregnant Breast-feeding How should I use this medication? This medication is infused into a vein. It is given in a hospital or clinic setting. A special MedGuide will be given to you before each treatment. Be sure to read this information carefully each time. Talk to your care team about the use of this medication in children. While it may be prescribed for children as young as 12 years for selected conditions, precautions do apply. Overdosage: If you think you have taken too much of this medicine contact a poison control center or emergency room at once. NOTE: This medicine is only for you. Do not share this medicine with others. What if I miss a dose? Keep appointments for follow-up doses. It is important not to miss your dose. Call your care team if you are unable to keep an appointment. What may   interact with this medication? Interactions have not been studied. This list may not describe all possible interactions. Give your health care provider a list of all the medicines, herbs, non-prescription drugs, or dietary supplements you use. Also tell them if you smoke, drink alcohol, or use illegal drugs. Some items may interact with your medicine. What should I watch for while using this medication? Your condition will be monitored carefully while you are receiving this medication. You may need blood work while taking this medication. This medication may cause serious skin reactions. They can happen weeks to months after  starting the medication. Contact your care team right away if you notice fevers or flu-like symptoms with a rash. The rash may be red or purple and then turn into blisters or peeling of the skin. You may also notice a red rash with swelling of the face, lips, or lymph nodes in your neck or under your arms. Tell your care team right away if you have any change in your eyesight. Talk to your care team if you are pregnant or think you might be pregnant. A negative pregnancy test is required before starting this medication. A reliable form of contraception is recommended while taking this medication and for 5 months after the last dose. Talk to your care team about effective forms of contraception. Do not breast-feed while taking this medication and for 5 months after the last dose. What side effects may I notice from receiving this medication? Side effects that you should report to your care team as soon as possible: Allergic reactions--skin rash, itching, hives, swelling of the face, lips, tongue, or throat Dry cough, shortness of breath or trouble breathing Eye pain, redness, irritation, or discharge with blurry or decreased vision Heart muscle inflammation--unusual weakness or fatigue, shortness of breath, chest pain, fast or irregular heartbeat, dizziness, swelling of the ankles, feet, or hands Hormone gland problems--headache, sensitivity to light, unusual weakness or fatigue, dizziness, fast or irregular heartbeat, increased sensitivity to cold or heat, excessive sweating, constipation, hair loss, increased thirst or amount of urine, tremors or shaking, irritability Infusion reactions--chest pain, shortness of breath or trouble breathing, feeling faint or lightheaded Kidney injury (glomerulonephritis)--decrease in the amount of urine, red or dark brown urine, foamy or bubbly urine, swelling of the ankles, hands, or feet Liver injury--right upper belly pain, loss of appetite, nausea, light-colored  stool, dark yellow or brown urine, yellowing skin or eyes, unusual weakness or fatigue Pain, tingling, or numbness in the hands or feet, muscle weakness, change in vision, confusion or trouble speaking, loss of balance or coordination, trouble walking, seizures Rash, fever, and swollen lymph nodes Redness, blistering, peeling, or loosening of the skin, including inside the mouth Sudden or severe stomach pain, bloody diarrhea, fever, nausea, vomiting Side effects that usually do not require medical attention (report these to your care team if they continue or are bothersome): Bone, joint, or muscle pain Diarrhea Fatigue Loss of appetite Nausea Skin rash This list may not describe all possible side effects. Call your doctor for medical advice about side effects. You may report side effects to FDA at 1-800-FDA-1088. Where should I keep my medication? This medication is given in a hospital or clinic. It will not be stored at home. NOTE: This sheet is a summary. It may not cover all possible information. If you have questions about this medicine, talk to your doctor, pharmacist, or health care provider.  2024 [employer] Standard (2021-06-01 00:00:00)  

## 2023-06-14 NOTE — Progress Notes (Signed)
 [name]  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 Stain for HER2 was negative. PET scan revealed omental involvement, but no distant metastasis was seen. She is receiving palliative chemotherapy with FOLFOX/nivolumab  (5-fluorouracil /leucovorin /oxaliplatin /nivolumab ), which now consists of 5-fluorouracil /leucovorin /nivolumab . Oxaliplatin  was discontinued after 11 cycles. PET in February 2024 revealed resolution of metabolic activity associated with the stomach. Decrease in size of omental nodularity. No associated metabolic activity. EGD in March revealed residual disease.   CT chest, abdomen and pelvis in July, 2024 revealed stable disease with persistent mild gastric wall thickening and subtle adjacent omental nodularity.  CT chest, abdomen pelvis and October revealed persistent wall thickening along the distal stomach with the adjacent stranding and soft tissue thickening, no additional areas of peritoneal nodularity or developing lymph node enlargement. Once again changes of interstitial lung disease with fibrosis and bronchiectasis were seen.  Her disease remains stable with treatment but still persists, so we recommended we continue the maintenance treatment and repeat imaging in 3 months. She continues to tolerate treatment well. 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 CT chest, abdomen, and pelvis done on 04/07/2023 revealed similar mild distal gastric wall thickening with perigastric fat stranding/soft tissue thickening but no new suspicious  finding, no evidence of metastatic disease within the chest, abdomen, or pelvis, stable small solid pulmonary nodules in the left lung base measuring up to 6 mm which are favored to be benign and diffuse hepatic steatosis. She will proceed with her 37th cycle of 5-fluorouracil /leucovorin /nivolumab  cycle today but we changed to every 3 weeks now. She reports feeling much better having that extra week off.    Drug-induced interstitial nephritis History immune mediated nephritis from immunotherapy in February, which resolved with prednisone  and holding the nivolumab . She has had chronic kidney disease since that episode. Her creatinine has increased again, but still within previous range. We continue to give her IV fluids the week of her chemotherapy and plan to do so weekly.    Right knee pain Recurrent acute right knee pain. No calf pain or tenderness. Previous X-ray of the right knee revealed mild degenerative changes with osteophyte formation. She has bilateral lower extremity swelling and notes she has a cardiac appointment in a couple of weeks.    Deep Venous Thrombosis On 05/03/2023 she was found to have an occlusive deep venous thrombosis along the popliteal and peroneal vein was started on apixaban  10 mg BID for 7 days then apixaban  5 mg BID indefinitely and was discontinued on aspirin 81 mg daily.  Plan: On 05/03/2023 she was found to have an occlusive deep venous thrombosis along the popliteal and peroneal vein and was started on apixaban  10 mg BID for 7 days then apixaban  5 mg BID indefinitely and was discontinued on aspirin 81 mg daily. I explained  to her that she will likely need to stay on anticoagulation since she still has persistent malignancy. She will receive day 1 cycle 37 of FOLFOX and Nivolumab  is scheduled on 06/14/2023 and her day 3 pump D/C cycle 37 is scheduled on 06/16/2023. We will continue weekly IV fluids. She has  a WBC of 5.6, hemoglobin of 12.2, and platelet count of  206,000. Her CMP is normal other than a elevated creatinine of 1.24 and low total protein of 6.4. Her thyroid  levels are also pending and I will call her with the results. She will repeat scans towards the end of May. She will return to clinic in 3 weeks for CT review and  CBC, CMP, T4, and TSH for her next cycle of treatment. The patient understands the plans discussed today and is in agreement with them.  She knows to contact our office if she develops concerns prior to her next appointment.  The patient understands the plans discussed today and is in agreement with them.  She knows to contact our office if she develops concerns prior to her next appointment.   I provided 30 minutes of face-to-face time during this encounter and > 50% was spent counseling as documented under my assessment and plan.    Mackenzie Adjutant, NP   [facility] [address] [name] Dept: [phone] Dept Fax: [phone]   No orders of the defined types were placed in this encounter.     CHIEF COMPLAINT:  CC: Gastric cancer (HCC) Stage IVB (cT4b, cN0, cM1)  Current Treatment:  FOLFOX + Nivolumab  q21 days  HISTORY OF PRESENT ILLNESS:   Oncology History  Gastric cancer (HCC)  09/10/2021 Initial Diagnosis   Gastric cancer (HCC)   09/10/2021 Cancer Staging   Staging form: Stomach, AJCC 8th Edition - Clinical stage from 09/10/2021: Stage IVB (cT4b, cN0, cM1) - Signed by Mackenzie Baumgartner, MD on 09/10/2021 Histopathologic type: Adenocarcinoma, NOS Stage prefix: Initial diagnosis Total positive nodes: 0 Histologic grade (G): G3 Histologic grading system: 3 grade system Sites of metastasis: Peritoneal surface Diagnostic confirmation: Positive histology PLUS positive immunophenotyping and/or positive genetic studies Specimen type: Endoscopy with Biopsy Staged by: Managing physician Carcinoembryonic antigen (CEA)  (ng/mL): 2.8 Carbohydrate antigen 19-9 (CA 19-9) (U/mL): 4.9 HER2 status: Unknown Microsatellite instability (MSI): Unknown Tumor location in stomach: Other Clinical staging modalities: Biopsy, Endoscopy Stage used in treatment planning: Yes National guidelines used in treatment planning: Yes Type of national guideline used in treatment planning: NCCN   09/28/2021 - 10/14/2021 Chemotherapy   Patient is on Treatment Plan : GASTROESOPHAGEAL FOLFOX + Nivolumab  q14d     09/28/2021 -  Chemotherapy   Patient is on Treatment Plan : GASTROESOPHAGEAL FOLFOX + Nivolumab  q21d (changed from q14d on 04/12/23)     12/09/2021 Genetic Testing   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  Report date is 12/09/2021.   The Multi-Cancer + RNA Panel offered by [facility] includes sequencing and/or deletion/duplication analysis of the following 84 genes:  AIP*, ALK, APC*, ATM*, AXIN2*, BAP1*, BARD1*, BLM*, BMPR1A*, BRCA1*, BRCA2*, BRIP1*, CASR, CDC73*, CDH1*, CDK4, CDKN1B*, CDKN1C*, CDKN2A, CEBPA, CHEK2*, CTNNA1*, DICER1*, DIS3L2*, EGFR, EPCAM, FH*, FLCN*, GATA2*, GPC3, GREM1, HOXB13, HRAS, KIT, MAX*, MEN1*, MET, MITF, MLH1*, MSH2*, MSH3*, MSH6*, MUTYH*, NBN*, NF1*, NF2*, NTHL1*, PALB2*, PDGFRA, PHOX2B, PMS2*, POLD1*, POLE*, POT1*, PRKAR1A*, PTCH1*, PTEN*, RAD50*, RAD51C*, RAD51D*, RB1*, RECQL4, RET, RUNX1*, SDHA*, SDHAF2*, SDHB*, SDHC*, SDHD*, SMAD4*, SMARCA4*, SMARCB1*, SMARCE1*, STK11*, SUFU*, TERC, TERT, TMEM127*, Tp53*, TSC1*, TSC2*, VHL*, WRN*, and WT1.  RNA analysis is performed for * genes.       INTERVAL HISTORY:  Mackenzie Lewis is here today for repeat clinical assessment. She denies fevers or chills. She denies pain. Her appetite is  good. Her weight has been stable.  REVIEW OF SYSTEMS:  Review of Systems  Constitutional:  Positive for fatigue.  HENT:  Negative.    Eyes: Negative.   Respiratory: Negative.    Cardiovascular:  Positive for leg swelling.   Gastrointestinal: Negative.   Endocrine: Negative.   Genitourinary: Negative.    Musculoskeletal: Negative.   Skin: Negative.   Neurological: Negative.   Hematological: Negative.   Psychiatric/Behavioral: Negative.       VITALS:  There were no vitals taken for this visit.  Wt Readings from Last 3 Encounters:  06/07/23 214 lb 12.8 oz (97.4 kg)  05/24/23 213 lb 4.8 oz (96.8 kg)  05/03/23 212 lb 9.6 oz (96.4 kg)    There is no height or weight on file to calculate BMI.  Performance status (ECOG): 1 - Symptomatic but completely ambulatory  PHYSICAL EXAM:  Physical Exam Constitutional:      Appearance: Normal appearance. She is normal weight.  HENT:     Head: Normocephalic.     Nose: Nose normal.     Mouth/Throat:     Mouth: Mucous membranes are moist.  Eyes:     Pupils: Pupils are equal, round, and reactive to light.  Cardiovascular:     Rate and Rhythm: Normal rate and regular rhythm.  Pulmonary:     Effort: Pulmonary effort is normal.     Breath sounds: Normal breath sounds.  Abdominal:     General: Bowel sounds are normal.     Palpations: Abdomen is soft.  Musculoskeletal:        General: Normal range of motion.     Cervical back: Normal range of motion.  Skin:    General: Skin is warm and dry.  Neurological:     General: No focal deficit present.     Mental Status: She is alert and oriented to person, place, and time. Mental status is at baseline.  Psychiatric:        Mood and Affect: Mood normal.        Behavior: Behavior normal.        Thought Content: Thought content normal.        Judgment: Judgment normal.     LABS:      Latest Ref Rng & Units 05/24/2023    8:33 AM 05/03/2023    8:38 AM 04/07/2023    9:43 AM  CBC  WBC 4.0 - 10.5 K/uL 5.3  6.9  8.1   Hemoglobin 12.0 - 15.0 g/dL 40.9  81.1  91.4   Hematocrit 36.0 - 46.0 % 36.1  35.9  36.8   Platelets 150 - 400 K/uL 206  192  188       Latest Ref Rng & Units 05/24/2023    8:33 AM 05/03/2023    8:38  AM 04/14/2023    9:39 AM  CMP  Glucose 70 - 99 mg/dL 782  956  213   BUN 8 - 23 mg/dL 18  26  28    Creatinine 0.44 - 1.00 mg/dL 0.86  5.78  4.69   Sodium 135 - 145 mmol/L 138  138  139   Potassium 3.5 - 5.1 mmol/L 4.0  4.1  4.1   Chloride 98 - 111 mmol/L 107  104  105   CO2 22 - 32 mmol/L 21  22  21    Calcium  8.9 - 10.3 mg/dL 9.2  9.6  9.3   Total Protein 6.5 - 8.1 g/dL 6.2  6.3    Total Bilirubin  0.0 - 1.2 mg/dL 0.4  0.4    Alkaline Phos 38 - 126 U/L 99  101    AST 15 - 41 U/L 29  29    ALT 0 - 44 U/L 27  29       Lab Results  Component Value Date   CEA1 2.9 09/10/2021   /  CEA  Date Value Ref Range Status  09/10/2021 2.9 0.0 - 4.7 ng/mL Final    Comment:    (NOTE)                             Nonsmokers          <3.9                             Smokers             <5.6 [employer] Electrochemiluminescence Immunoassay (ECLIA) Values obtained with different assay methods or kits cannot be used interchangeably.  Results cannot be interpreted as absolute evidence of the presence or absence of malignant disease. Performed At: [facility] [address] [place], [place] [id number] Pearlean Botts MD WU:[id number]    No results found for: "PSA1" No results found for: "[id number]" No results found for: "CAN125"  No results found for: "TOTALPROTELP", "ALBUMINELP", "A1GS", "A2GS", "BETS", "BETA2SER", "GAMS", "MSPIKE", "SPEI" No results found for: "TIBC", "FERRITIN", "IRONPCTSAT" No results found for: "LDH"  STUDIES:  No results found.    HISTORY:   Past Medical History:  Diagnosis Date   Abdominal pain 01/17/2023   Appendicitis with peritonitis 04/10/2016   Atypical chest pain 09/09/2016   Benign hypertensive renal disease 09/01/2016   Bilateral primary osteoarthritis of knee 01/20/2016   Borderline diabetes 09/09/2016   CKD (chronic kidney disease), stage II 09/01/2016   Cyclic citrullinated peptide (CCP) antibody positive 01/20/2016   Because she has  positive CCP, I want to make sure we monitor the patient closely and we encouraged the patient to look for symptoms that include increased hand stiffness, swelling and redness to the MCP joint.  If that happens, she is to call us  so that we can schedule her for an ultrasound to look for synovitis.     Essential hypertension 09/09/2016   Gastric cancer (HCC) 09/10/2021   Hyperlipidemia 09/01/2016   Hypertension    Hypothyroidism 09/01/2016   Osteoarthritis of both feet 01/20/2016   Osteoarthritis, hand 01/20/2016   Thyroid  disease     Past Surgical History:  Procedure Laterality Date   APPENDECTOMY     LAPAROSCOPIC APPENDECTOMY N/A 04/10/2016   Procedure: APPENDECTOMY LAPAROSCOPIC;  Surgeon: Oza Blumenthal, MD;  Location: [facility] OR;  Service: General;  Laterality: N/A;    Family History  Problem Relation Age of Onset   Hypertension Mother    Prostate cancer Father        metastatic; d. 52   Brain cancer Sister 81   Breast cancer Sister 40   AAA (abdominal aortic aneurysm) Brother    Leukemia Cousin        x2 maternal female cousins; d. before 92   Breast cancer Daughter 105       DCIS    Social History:  reports that she has never smoked. She has never used smokeless tobacco. She reports that she does not currently use alcohol . She reports that she does not use drugs.The patient is accompanied by friend today.  Allergies:  Allergies  Allergen Reactions   Doxycycline Rash   Sulfa Antibiotics Rash and Other (See Comments)    Other reaction(s): Other (See Comments)  "Made me feel weird"    Current Medications: Current Outpatient Medications  Medication Sig Dispense Refill   amLODipine (NORVASC) 5 MG tablet Take 5 mg by mouth daily.     apixaban  (ELIQUIS ) 5 MG TABS tablet Take 1 tablet (5 mg total) by mouth 2 (two) times daily. 180 tablet 1   b complex vitamins capsule Take 1 capsule by mouth daily.     Calcium  Carbonate (CALCIUM  600 PO) Take 1 tablet by mouth daily.      Cholecalciferol (VITAMIN D3) 5000 units CAPS Take 1 capsule by mouth daily.     famotidine (PEPCID) 40 MG tablet Take 40 mg by mouth at bedtime.     hydrochlorothiazide (HYDRODIURIL) 12.5 MG tablet Take 12.5 mg by mouth daily.     KRILL OIL PO Take 1 capsule by mouth daily. Unknown strenght     Lactobacillus TABS Take 1 tablet by mouth 2 (two) times daily.     levothyroxine  (SYNTHROID , LEVOTHROID) 75 MCG tablet Take 75 mcg by mouth daily before breakfast.      LORazepam  (ATIVAN ) 0.5 MG tablet Take 1 tablet (0.5 mg total) by mouth every 8 (eight) hours. 30 tablet 0   NON FORMULARY Take 1 Dose by mouth See admin instructions. MMW: 3 parts Maalox 2 parts Benadryl  1 part viscious lidicaine  Disp. 6oz  Instructions: 5ml swish and swallow every 3-4 hours     omeprazole  (PRILOSEC) 40 MG capsule Take 1 capsule (40 mg total) by mouth 2 (two) times daily. 60 capsule 5   ondansetron  (ZOFRAN -ODT) 4 MG disintegrating tablet Take 1 tablet (4 mg total) by mouth every 8 (eight) hours as needed for nausea or vomiting. 90 tablet 0   polyethylene glycol powder (GLYCOLAX /MIRALAX ) 17 GM/SCOOP powder Take 1 Container by mouth daily.     potassium chloride  SA (KLOR-CON  M) 20 MEQ tablet TAKE 1 TABLET TWICE A DAY 180 tablet 3   pravastatin  (PRAVACHOL ) 20 MG tablet Take 1 tablet (20 mg total) by mouth every evening. 90 tablet 1   Probiotic Product (PROBIOTIC DAILY PO) Take 1 tablet by mouth daily.     prochlorperazine  (COMPAZINE ) 10 MG tablet Take 1 tablet (10 mg total) by mouth every 6 (six) hours as needed for nausea or vomiting. 90 tablet 3   valsartan (DIOVAN) 160 MG tablet Take 160 mg by mouth daily.     No current facility-administered medications for this visit.

## 2023-06-15 DIAGNOSIS — Z0000 Encounter for general adult medical examination without abnormal findings: Secondary | ICD-10-CM | POA: Diagnosis not present

## 2023-06-15 DIAGNOSIS — I10 Essential (primary) hypertension: Secondary | ICD-10-CM | POA: Diagnosis not present

## 2023-06-15 DIAGNOSIS — Z6831 Body mass index (BMI) 31.0-31.9, adult: Secondary | ICD-10-CM | POA: Diagnosis not present

## 2023-06-15 DIAGNOSIS — K219 Gastro-esophageal reflux disease without esophagitis: Secondary | ICD-10-CM | POA: Diagnosis not present

## 2023-06-15 DIAGNOSIS — E785 Hyperlipidemia, unspecified: Secondary | ICD-10-CM | POA: Diagnosis not present

## 2023-06-15 DIAGNOSIS — E063 Autoimmune thyroiditis: Secondary | ICD-10-CM | POA: Diagnosis not present

## 2023-06-15 DIAGNOSIS — Z1331 Encounter for screening for depression: Secondary | ICD-10-CM | POA: Diagnosis not present

## 2023-06-15 DIAGNOSIS — G4733 Obstructive sleep apnea (adult) (pediatric): Secondary | ICD-10-CM | POA: Diagnosis not present

## 2023-06-15 LAB — T4: T4, Total: 9.4 ug/dL (ref 4.5–12.0)

## 2023-06-16 ENCOUNTER — Inpatient Hospital Stay: Attending: Hematology and Oncology

## 2023-06-16 VITALS — BP 148/55 | HR 78 | Temp 98.2°F | Resp 18

## 2023-06-16 DIAGNOSIS — Z5111 Encounter for antineoplastic chemotherapy: Secondary | ICD-10-CM | POA: Diagnosis not present

## 2023-06-16 DIAGNOSIS — C168 Malignant neoplasm of overlapping sites of stomach: Secondary | ICD-10-CM

## 2023-06-16 DIAGNOSIS — Z86718 Personal history of other venous thrombosis and embolism: Secondary | ICD-10-CM | POA: Insufficient documentation

## 2023-06-16 DIAGNOSIS — C169 Malignant neoplasm of stomach, unspecified: Secondary | ICD-10-CM | POA: Insufficient documentation

## 2023-06-16 DIAGNOSIS — Z7901 Long term (current) use of anticoagulants: Secondary | ICD-10-CM | POA: Insufficient documentation

## 2023-06-16 DIAGNOSIS — E039 Hypothyroidism, unspecified: Secondary | ICD-10-CM | POA: Insufficient documentation

## 2023-06-16 DIAGNOSIS — Z79899 Other long term (current) drug therapy: Secondary | ICD-10-CM | POA: Diagnosis not present

## 2023-06-16 MED ORDER — SODIUM CHLORIDE 0.9% FLUSH
10.0000 mL | INTRAVENOUS | Status: DC | PRN
  Administered 2023-06-16: 10 mL

## 2023-06-16 MED ORDER — HEPARIN SOD (PORK) LOCK FLUSH 100 UNIT/ML IV SOLN
500.0000 [IU] | Freq: Once | INTRAVENOUS | Status: AC | PRN
  Administered 2023-06-16: 500 [IU]

## 2023-06-16 MED ORDER — SODIUM CHLORIDE 0.9 % IV SOLN
Freq: Once | INTRAVENOUS | Status: AC
Start: 2023-06-16 — End: 2023-06-16

## 2023-06-16 NOTE — Patient Instructions (Signed)
 Fluorouracil Injection What is this medication? FLUOROURACIL (flure oh YOOR a sil) treats some types of cancer. It works by slowing down the growth of cancer cells. This medicine may be used for other purposes; ask your health care provider or pharmacist if you have questions. COMMON BRAND NAME(S): Adrucil What should I tell my care team before I take this medication? They need to know if you have any of these conditions: Blood disorders Dihydropyrimidine dehydrogenase (DPD) deficiency Infection, such as chickenpox, cold sores, herpes Kidney disease Liver disease Poor nutrition Recent or ongoing radiation therapy An unusual or allergic reaction to fluorouracil, other medications, foods, dyes, or preservatives If you or your partner are pregnant or trying to get pregnant Breast-feeding How should I use this medication? This medication is injected into a vein. It is administered by your care team in a hospital or clinic setting. Talk to your care team about the use of this medication in children. Special care may be needed. Overdosage: If you think you have taken too much of this medicine contact a poison control center or emergency room at once. NOTE: This medicine is only for you. Do not share this medicine with others. What if I miss a dose? Keep appointments for follow-up doses. It is important not to miss your dose. Call your care team if you are unable to keep an appointment. What may interact with this medication? Do not take this medication with any of the following: Live virus vaccines This medication may also interact with the following: Medications that treat or prevent blood clots, such as warfarin, enoxaparin, dalteparin This list may not describe all possible interactions. Give your health care provider a list of all the medicines, herbs, non-prescription drugs, or dietary supplements you use. Also tell them if you smoke, drink alcohol, or use illegal drugs. Some items may  interact with your medicine. What should I watch for while using this medication? Your condition will be monitored carefully while you are receiving this medication. This medication may make you feel generally unwell. This is not uncommon as chemotherapy can affect healthy cells as well as cancer cells. Report any side effects. Continue your course of treatment even though you feel ill unless your care team tells you to stop. In some cases, you may be given additional medications to help with side effects. Follow all directions for their use. This medication may increase your risk of getting an infection. Call your care team for advice if you get a fever, chills, sore throat, or other symptoms of a cold or flu. Do not treat yourself. Try to avoid being around people who are sick. This medication may increase your risk to bruise or bleed. Call your care team if you notice any unusual bleeding. Be careful brushing or flossing your teeth or using a toothpick because you may get an infection or bleed more easily. If you have any dental work done, tell your dentist you are receiving this medication. Avoid taking medications that contain aspirin, acetaminophen, ibuprofen, naproxen, or ketoprofen unless instructed by your care team. These medications may hide a fever. Do not treat diarrhea with over the counter products. Contact your care team if you have diarrhea that lasts more than 2 days or if it is severe and watery. This medication can make you more sensitive to the sun. Keep out of the sun. If you cannot avoid being in the sun, wear protective clothing and sunscreen. Do not use sun lamps, tanning beds, or tanning booths. Talk to  your care team if you or your partner wish to become pregnant or think you might be pregnant. This medication can cause serious birth defects if taken during pregnancy and for 3 months after the last dose. A reliable form of contraception is recommended while taking this  medication and for 3 months after the last dose. Talk to your care team about effective forms of contraception. Do not father a child while taking this medication and for 3 months after the last dose. Use a condom while having sex during this time period. Do not breastfeed while taking this medication. This medication may cause infertility. Talk to your care team if you are concerned about your fertility. What side effects may I notice from receiving this medication? Side effects that you should report to your care team as soon as possible: Allergic reactions--skin rash, itching, hives, swelling of the face, lips, tongue, or throat Heart attack--pain or tightness in the chest, shoulders, arms, or jaw, nausea, shortness of breath, cold or clammy skin, feeling faint or lightheaded Heart failure--shortness of breath, swelling of the ankles, feet, or hands, sudden weight gain, unusual weakness or fatigue Heart rhythm changes--fast or irregular heartbeat, dizziness, feeling faint or lightheaded, chest pain, trouble breathing High ammonia level--unusual weakness or fatigue, confusion, loss of appetite, nausea, vomiting, seizures Infection--fever, chills, cough, sore throat, wounds that don't heal, pain or trouble when passing urine, general feeling of discomfort or being unwell Low red blood cell level--unusual weakness or fatigue, dizziness, headache, trouble breathing Pain, tingling, or numbness in the hands or feet, muscle weakness, change in vision, confusion or trouble speaking, loss of balance or coordination, trouble walking, seizures Redness, swelling, and blistering of the skin over hands and feet Severe or prolonged diarrhea Unusual bruising or bleeding Side effects that usually do not require medical attention (report to your care team if they continue or are bothersome): Dry skin Headache Increased tears Nausea Pain, redness, or swelling with sores inside the mouth or throat Sensitivity  to light Vomiting This list may not describe all possible side effects. Call your doctor for medical advice about side effects. You may report side effects to FDA at 1-800-FDA-1088. Where should I keep my medication? This medication is given in a hospital or clinic. It will not be stored at home. NOTE: This sheet is a summary. It may not cover all possible information. If you have questions about this medicine, talk to your doctor, pharmacist, or health care provider.  2024 [employer] (2021-06-09 00:00:00)

## 2023-06-21 ENCOUNTER — Inpatient Hospital Stay

## 2023-06-21 VITALS — BP 134/69 | HR 99 | Temp 98.3°F | Resp 18

## 2023-06-21 DIAGNOSIS — E860 Dehydration: Secondary | ICD-10-CM

## 2023-06-21 DIAGNOSIS — Z7901 Long term (current) use of anticoagulants: Secondary | ICD-10-CM | POA: Diagnosis not present

## 2023-06-21 DIAGNOSIS — R7989 Other specified abnormal findings of blood chemistry: Secondary | ICD-10-CM

## 2023-06-21 DIAGNOSIS — E039 Hypothyroidism, unspecified: Secondary | ICD-10-CM | POA: Diagnosis not present

## 2023-06-21 DIAGNOSIS — Z79899 Other long term (current) drug therapy: Secondary | ICD-10-CM | POA: Diagnosis not present

## 2023-06-21 DIAGNOSIS — C169 Malignant neoplasm of stomach, unspecified: Secondary | ICD-10-CM | POA: Diagnosis not present

## 2023-06-21 DIAGNOSIS — E876 Hypokalemia: Secondary | ICD-10-CM

## 2023-06-21 DIAGNOSIS — Z86718 Personal history of other venous thrombosis and embolism: Secondary | ICD-10-CM | POA: Diagnosis not present

## 2023-06-21 DIAGNOSIS — Z5111 Encounter for antineoplastic chemotherapy: Secondary | ICD-10-CM | POA: Diagnosis not present

## 2023-06-21 MED ORDER — SODIUM CHLORIDE 0.9 % IV SOLN: INTRAVENOUS | Status: DC

## 2023-06-21 MED ORDER — HEPARIN SOD (PORK) LOCK FLUSH 100 UNIT/ML IV SOLN
250.0000 [IU] | Freq: Once | INTRAVENOUS | Status: AC | PRN
  Administered 2023-06-21: 500 [IU]

## 2023-06-21 MED ORDER — SODIUM CHLORIDE 0.9% FLUSH
10.0000 mL | Freq: Once | INTRAVENOUS | Status: AC | PRN
  Administered 2023-06-21: 10 mL

## 2023-06-21 NOTE — Patient Instructions (Signed)

## 2023-06-28 ENCOUNTER — Inpatient Hospital Stay

## 2023-06-28 VITALS — BP 171/86 | HR 100 | Temp 97.9°F | Resp 18 | Ht 64.0 in | Wt 208.0 lb

## 2023-06-28 DIAGNOSIS — Z86718 Personal history of other venous thrombosis and embolism: Secondary | ICD-10-CM | POA: Diagnosis not present

## 2023-06-28 DIAGNOSIS — C169 Malignant neoplasm of stomach, unspecified: Secondary | ICD-10-CM | POA: Diagnosis not present

## 2023-06-28 DIAGNOSIS — R7989 Other specified abnormal findings of blood chemistry: Secondary | ICD-10-CM

## 2023-06-28 DIAGNOSIS — Z7901 Long term (current) use of anticoagulants: Secondary | ICD-10-CM | POA: Diagnosis not present

## 2023-06-28 DIAGNOSIS — Z5111 Encounter for antineoplastic chemotherapy: Secondary | ICD-10-CM | POA: Diagnosis not present

## 2023-06-28 DIAGNOSIS — E860 Dehydration: Secondary | ICD-10-CM

## 2023-06-28 DIAGNOSIS — E039 Hypothyroidism, unspecified: Secondary | ICD-10-CM | POA: Diagnosis not present

## 2023-06-28 DIAGNOSIS — E876 Hypokalemia: Secondary | ICD-10-CM

## 2023-06-28 DIAGNOSIS — Z79899 Other long term (current) drug therapy: Secondary | ICD-10-CM | POA: Diagnosis not present

## 2023-06-28 MED ORDER — SODIUM CHLORIDE 0.9% FLUSH
3.0000 mL | Freq: Once | INTRAVENOUS | Status: AC | PRN
  Administered 2023-06-28: 10 mL

## 2023-06-28 MED ORDER — SODIUM CHLORIDE 0.9 % IV SOLN: INTRAVENOUS | Status: DC

## 2023-06-28 MED ORDER — HEPARIN SOD (PORK) LOCK FLUSH 100 UNIT/ML IV SOLN
500.0000 [IU] | Freq: Once | INTRAVENOUS | Status: AC | PRN
  Administered 2023-06-28: 500 [IU]

## 2023-06-28 NOTE — Patient Instructions (Signed)

## 2023-06-30 ENCOUNTER — Ambulatory Visit (INDEPENDENT_AMBULATORY_CARE_PROVIDER_SITE_OTHER)
Admission: RE | Admit: 2023-06-30 | Discharge: 2023-06-30 | Disposition: A | Discharge status: Home / Self Care | Source: Ambulatory Visit | Attending: Oncology | Admitting: Oncology

## 2023-06-30 ENCOUNTER — Inpatient Hospital Stay

## 2023-06-30 DIAGNOSIS — Z5111 Encounter for antineoplastic chemotherapy: Secondary | ICD-10-CM | POA: Diagnosis not present

## 2023-06-30 DIAGNOSIS — Z86718 Personal history of other venous thrombosis and embolism: Secondary | ICD-10-CM | POA: Diagnosis not present

## 2023-06-30 DIAGNOSIS — C169 Malignant neoplasm of stomach, unspecified: Secondary | ICD-10-CM | POA: Diagnosis not present

## 2023-06-30 DIAGNOSIS — Z79899 Other long term (current) drug therapy: Secondary | ICD-10-CM | POA: Diagnosis not present

## 2023-06-30 DIAGNOSIS — C168 Malignant neoplasm of overlapping sites of stomach: Secondary | ICD-10-CM

## 2023-06-30 DIAGNOSIS — E039 Hypothyroidism, unspecified: Secondary | ICD-10-CM | POA: Diagnosis not present

## 2023-06-30 DIAGNOSIS — Z7901 Long term (current) use of anticoagulants: Secondary | ICD-10-CM | POA: Diagnosis not present

## 2023-06-30 LAB — CMP (CANCER CENTER ONLY)
ALT: 22 U/L (ref 0–44)
AST: 21 U/L (ref 15–41)
Albumin: 4 g/dL (ref 3.5–5.0)
Alkaline Phosphatase: 98 U/L (ref 38–126)
Anion gap: 14 (ref 5–15)
BUN: 21 mg/dL (ref 8–23)
CO2: 22 mmol/L (ref 22–32)
Calcium: 9.6 mg/dL (ref 8.9–10.3)
Chloride: 104 mmol/L (ref 98–111)
Creatinine: 1.45 mg/dL — ABNORMAL HIGH (ref 0.44–1.00)
GFR, Estimated: 35 mL/min — ABNORMAL LOW (ref >60)
Glucose, Bld: 117 mg/dL — ABNORMAL HIGH (ref 70–99)
Potassium: 3.8 mmol/L (ref 3.5–5.1)
Sodium: 140 mmol/L (ref 135–145)
Total Bilirubin: 0.6 mg/dL (ref 0.0–1.2)
Total Protein: 6.3 g/dL — ABNORMAL LOW (ref 6.5–8.1)

## 2023-06-30 LAB — CBC WITH DIFFERENTIAL (CANCER CENTER ONLY)
Abs Immature Granulocytes: 0.02 10*3/uL (ref 0.00–0.07)
Basophils Absolute: 0 10*3/uL (ref 0.0–0.1)
Basophils Relative: 1 %
Eosinophils Absolute: 0.2 10*3/uL (ref 0.0–0.5)
Eosinophils Relative: 3 %
HCT: 36.9 % (ref 36.0–46.0)
Hemoglobin: 12.4 g/dL (ref 12.0–15.0)
Immature Granulocytes: 0 %
Lymphocytes Relative: 33 %
Lymphs Abs: 2.1 10*3/uL (ref 0.7–4.0)
MCH: 28.7 pg (ref 26.0–34.0)
MCHC: 33.6 g/dL (ref 30.0–36.0)
MCV: 85.4 fL (ref 80.0–100.0)
Monocytes Absolute: 0.7 10*3/uL (ref 0.1–1.0)
Monocytes Relative: 11 %
Neutro Abs: 3.4 10*3/uL (ref 1.7–7.7)
Neutrophils Relative %: 52 %
Platelet Count: 180 10*3/uL (ref 150–400)
RBC: 4.32 MIL/uL (ref 3.87–5.11)
RDW: 14.6 % (ref 11.5–15.5)
WBC Count: 6.4 10*3/uL (ref 4.0–10.5)
nRBC: 0 % (ref 0.0–0.2)

## 2023-06-30 LAB — T4, FREE: Free T4: 1.38 ng/dL — ABNORMAL HIGH (ref 0.61–1.12)

## 2023-06-30 LAB — TSH: TSH: 1.647 u[IU]/mL (ref 0.350–4.500)

## 2023-06-30 MED ORDER — HEPARIN SOD (PORK) LOCK FLUSH 100 UNIT/ML IV SOLN
500.0000 [IU] | Freq: Once | INTRAVENOUS | Status: AC
  Administered 2023-06-30: 500 [IU] via INTRAVENOUS

## 2023-06-30 MED ORDER — IOHEXOL 300 MG/ML  SOLN
100.0000 mL | Freq: Once | INTRAMUSCULAR | Status: AC | PRN
  Administered 2023-06-30: 80 mL via INTRAVENOUS

## 2023-07-01 NOTE — Progress Notes (Signed)
 [facility]  [address] [place],  [place]  [address] [phone] Clinic Day:07/05/23  Referring physician: Olan Bering, MD  ASSESSMENT & PLAN:  Assessment: Gastric cancer (HCC) Stage IVB (T4 N0 M1) poorly differentiated adenocarcinoma of the stomach with signet ring features and ulceration, metastatic to the omentum. Stain for HER2 was negative. PET scan revealed omental involvement, but no distant metastasis was seen. She is receiving palliative chemotherapy with FOLFOX/nivolumab  (5-fluorouracil /leucovorin /oxaliplatin /nivolumab ), which now consists of 5-fluorouracil /leucovorin /nivolumab . Oxaliplatin  was discontinued after 11 cycles. PET in February 2024 revealed resolution of metabolic activity associated with the stomach. Decrease in size of omental nodularity. No associated metabolic activity. EGD in March revealed residual disease.   CT chest, abdomen and pelvis in July, 2024 revealed stable disease with persistent mild gastric wall thickening and subtle adjacent omental nodularity.  CT chest, abdomen pelvis and October revealed persistent wall thickening along the distal stomach with the adjacent stranding and soft tissue thickening, no additional areas of peritoneal nodularity or developing lymph node enlargement. Once again changes of interstitial lung disease with fibrosis and bronchiectasis were seen.  Her disease remained stable. 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 CT chest, abdomen, and pelvis done on 04/07/2023 revealed similar mild distal gastric wall thickening with perigastric fat stranding/soft tissue thickening, stable small solid pulmonary nodules in the left lung base measuring up to 6 mm which are favored to be benign and diffuse hepatic steatosis. CT chest, abdomen, and pelvis done on 06/30/2023 revealed persistent nonspecific  diffuse gastric wall thickening with persistent subtle peritoneal nodularity inferior to the gastric body and antrum, stable pulmonary nodules measuring up to 6 mm, and no convincing evidence for metastatic disease within the chest.    Drug-induced interstitial nephritis History immune mediated nephritis from immunotherapy in February, which resolved with prednisone  and holding the nivolumab . She has had chronic kidney disease since that episode. Her creatinine has increased again, but still within previous range. We continue to give her IV fluids weekly. Creatinine a little higher today at 1.45.   Right knee pain Recurrent acute right knee pain. No calf pain or tenderness, no leg swelling. X-ray of the right knee revealed mild degenerative changes with osteophyte formation.   Deep Venous Thrombosis On 05/03/2023 she was found to have an occlusive deep venous thrombosis along the popliteal and peroneal vein was started on apixaban  10 mg BID for 7 days then apixaban  5 mg BID indefinitely and was discontinued on aspirin 81 mg daily.   Plan: I suggested she take a oral B6 or B-complex supplement to help aid in her neuropathy. She informed me that her PCP discontinued one of her BP medications and increased her hydrochlorothiazide to 25 mg due to worsening swelling of the lower extremities. Her BP was elevated at 154/77 and repeat was 136/73. She had a CT chest, abdomen, and pelvis done on 06/30/2023 which revealed persistent nonspecific diffuse gastric wall thickening with persistent subtle peritoneal nodularity inferior to the gastric body and antrum, stable pulmonary nodules measuring up to 6 mm, and no convincing evidence for metastatic disease within the chest. Her day 1 cycle 38 of FOLFOX and Nivolumab  is scheduled on 07/05/2023 which she receives every 3 weeks now. She had a WBC of 6.4, hemoglobin of 12.4, and platelet count of 180,000. Her CMP is fairly normal other than a elevated creatinine of 1.45  up from 1.24  and low total protein of 6.3. She continues an oral potassium supplement BID. She informed me that she will be going on vacation on June, 28th-July, 5th, I will postpone her treatment by 1 week until she gets back. Since her scans remain stable, we can continue 3 week interval with her treatment. [name] back in 3 weeks with CBC and CMP. However, her June treatment will be postponed 1 week until she returns July, 8th. We will continue weekly IV fluids to keep her renal function stable. The patient understands the plans discussed today and is in agreement with them.  She knows to contact our office if she develops concerns prior to her next appointment.  I provided 25 minutes of face-to-face time during this encounter and > 50% was spent counseling as documented under my assessment and plan.   Nolia Baumgartner, MD [name] [address] [place] [address] Dept: [phone] Dept Fax: [phone]   No orders of the defined types were placed in this encounter.   CHIEF COMPLAINT:  CC: Stage IVB gastric cancer  Current Treatment:  Maintenance fluorouracil /leucovorin /nivolumab  every 21 days  HISTORY OF PRESENT ILLNESS:   Oncology History  Gastric cancer (HCC)  09/10/2021 Initial Diagnosis   Gastric cancer (HCC)   09/10/2021 Cancer Staging   Staging form: Stomach, AJCC 8th Edition - Clinical stage from 09/10/2021: Stage IVB (cT4b, cN0, cM1) - Signed by Nolia Baumgartner, MD on 09/10/2021 Histopathologic type: Adenocarcinoma, NOS Stage prefix: Initial diagnosis Total positive nodes: 0 Histologic grade (G): G3 Histologic grading system: 3 grade system Sites of metastasis: Peritoneal surface Diagnostic confirmation: Positive histology PLUS positive immunophenotyping and/or positive genetic studies Specimen type: Endoscopy with Biopsy Staged by: Managing physician Carcinoembryonic  antigen (CEA) (ng/mL): 2.8 Carbohydrate antigen 19-9 (CA 19-9) (U/mL): 4.9 HER2 status: Unknown Microsatellite instability (MSI): Unknown Tumor location in stomach: Other Clinical staging modalities: Biopsy, Endoscopy Stage used in treatment planning: Yes National guidelines used in treatment planning: Yes Type of national guideline used in treatment planning: NCCN   09/28/2021 - 10/14/2021 Chemotherapy   Patient is on Treatment Plan : GASTROESOPHAGEAL FOLFOX + Nivolumab  q14d     09/28/2021 -  Chemotherapy   Patient is on Treatment Plan : GASTROESOPHAGEAL FOLFOX + Nivolumab  q21d (changed from q14d on 04/12/23)     12/09/2021 Genetic Testing   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  Report date is 12/09/2021.   The Multi-Cancer + RNA Panel offered by [facility] includes sequencing and/or deletion/duplication analysis of the following 84 genes:  AIP*, ALK, APC*, ATM*, AXIN2*, BAP1*, BARD1*, BLM*, BMPR1A*, BRCA1*, BRCA2*, BRIP1*, CASR, CDC73*, CDH1*, CDK4, CDKN1B*, CDKN1C*, CDKN2A, CEBPA, CHEK2*, CTNNA1*, DICER1*, DIS3L2*, EGFR, EPCAM, FH*, FLCN*, GATA2*, GPC3, GREM1, HOXB13, HRAS, KIT, MAX*, MEN1*, MET, MITF, MLH1*, MSH2*, MSH3*, MSH6*, MUTYH*, NBN*, NF1*, NF2*, NTHL1*, PALB2*, PDGFRA, PHOX2B, PMS2*, POLD1*, POLE*, POT1*, PRKAR1A*, PTCH1*, PTEN*, RAD50*, RAD51C*, RAD51D*, RB1*, RECQL4, RET, RUNX1*, SDHA*, SDHAF2*, SDHB*, SDHC*, SDHD*, SMAD4*, SMARCA4*, SMARCB1*, SMARCE1*, STK11*, SUFU*, TERC, TERT, TMEM127*, Tp53*, TSC1*, TSC2*, VHL*, WRN*, and WT1.  RNA analysis is performed for * genes.     INTERVAL HISTORY:  Ayanni is here today for repeat clinical assessment of her stage IVB gastric cancer. Patient states that she feels well but complains of fatigue, intermittent nausea, neuropathy, and left shoulder pain. I suggested she take a oral B6 or B-complex supplement to help aid in her  neuropathy. She informed me that her PCP discontinued one of her BP  medications and increased her hydrochlorothiazide to 25 mg due to worsening swelling of the lower extremities. Her BP was elevated at 154/77 and repeat was 136/73. She had a CT chest, abdomen, and pelvis done on 06/30/2023 which revealed persistent nonspecific diffuse gastric wall thickening with persistent subtle peritoneal nodularity inferior to the gastric body and antrum, stable pulmonary nodules measuring up to 6 mm, and no convincing evidence for metastatic disease within the chest. Her day 1 cycle 38 of FOLFOX and Nivolumab  is scheduled on 07/05/2023 which she receives every 3 weeks. She had a WBC of 6.4, hemoglobin of 12.4, and platelet count of 180,000. Her CMP is fairly normal other than a elevated creatinine of 1.45 up from 1.24 and low total protein of 6.3. She continues an oral potassium supplement BID. She informed me that she will be going on vacation on June, 28th-July, 5th, I will postpone her treatment by 1 week until she gets back. Since her scans remain stable, we can continue 3 week interval with her treatment. [name] back in 3 weeks with CBC and CMP. However, her next treatment will be postponed 1 week until she returns July, 8th. We will continue weekly IV fluids to keep her renal function stable. She denies fever, chills, night sweats, or other signs of infection. She denies cardiorespiratory and gastrointestinal issues. She  denies pain. Her appetite is ok and Her weight has decreased 1 pounds over last 3 weeks. This patient is accompanied in the office by her daughter.   REVIEW OF SYSTEMS:  Review of Systems  Constitutional:  Positive for fatigue. Negative for appetite change, chills, diaphoresis, fever and unexpected weight change.  HENT:  Negative.  Negative for hearing loss, lump/mass, mouth sores, nosebleeds, sore throat, tinnitus, trouble swallowing and voice change.   Eyes: Negative.  Negative for eye problems and icterus.  Respiratory: Negative.  Negative for chest  tightness, cough, hemoptysis, shortness of breath and wheezing.   Cardiovascular:  Positive for leg swelling (improved). Negative for chest pain and palpitations.  Gastrointestinal:  Positive for nausea (intermittent). Negative for abdominal distention, abdominal pain, blood in stool, constipation, diarrhea, rectal pain and vomiting.  Endocrine: Negative.  Negative for hot flashes.  Genitourinary:  Negative for bladder incontinence, difficulty urinating, dyspareunia, dysuria, frequency, hematuria, menstrual problem, nocturia, pelvic pain, vaginal bleeding and vaginal discharge.   Musculoskeletal:  Positive for arthralgias (right knee, left shoulder rating 7-8/10), back pain (intermittent lower back with exertion) and myalgias (bilateral legs right>left). Negative for flank pain, gait problem, neck pain and neck stiffness.       Left shoulder pain  Skin: Negative.  Negative for itching, rash and wound.  Neurological:  Positive for light-headedness (when stands up quickly) and numbness (neuropathy hands and feet). Negative for dizziness, extremity weakness, gait problem, headaches, seizures and speech difficulty.  Hematological: Negative.  Negative for adenopathy. Does not bruise/bleed easily.  Psychiatric/Behavioral:  Positive for sleep disturbance (associated with shoulder pain). Negative for confusion, decreased concentration, depression and suicidal ideas. The patient is not nervous/anxious.     VITALS:  Blood pressure 136/73, pulse 85, temperature 98.2 F (36.8 C), temperature source Oral, resp. rate 18, height 5\' 4"  (1.626 m), weight 211 lb 14.4 oz (96.1 kg), SpO2 95%.  Wt Readings from Last 3 Encounters:  07/07/23 212 lb 1.3 oz (96.2 kg)  07/05/23 211 lb 14.4 oz (96.1 kg)  06/28/23 208 lb (94.3 kg)  Body mass index is 36.37 kg/m.  Performance status (ECOG): 1 - Symptomatic but completely ambulatory  PHYSICAL EXAM:  Physical Exam Vitals and nursing note reviewed. Exam conducted with a  chaperone present.  Constitutional:      General: She is not in acute distress.    Appearance: Normal appearance. She is normal weight. She is not ill-appearing, toxic-appearing or diaphoretic.  HENT:     Head: Normocephalic and atraumatic.     Right Ear: Tympanic membrane, ear canal and external ear normal. There is no impacted cerumen.     Left Ear: Tympanic membrane, ear canal and external ear normal. There is no impacted cerumen.     Nose: Nose normal. No congestion or rhinorrhea.     Mouth/Throat:     Mouth: Mucous membranes are moist.     Pharynx: Oropharynx is clear. No oropharyngeal exudate or posterior oropharyngeal erythema.  Eyes:     General: No scleral icterus.       Right eye: No discharge.        Left eye: No discharge.     Extraocular Movements: Extraocular movements intact.     Conjunctiva/sclera: Conjunctivae normal.     Pupils: Pupils are equal, round, and reactive to light.  Neck:     Vascular: No carotid bruit.  Cardiovascular:     Rate and Rhythm: Normal rate and regular rhythm.     Pulses: Normal pulses.     Heart sounds: Normal heart sounds. No murmur heard.    No friction rub. No gallop.  Pulmonary:     Effort: Pulmonary effort is normal. No respiratory distress.     Breath sounds: Normal breath sounds. No stridor. No wheezing, rhonchi or rales.  Chest:     Chest wall: No tenderness.  Abdominal:     General: Bowel sounds are normal. There is no distension.     Palpations: Abdomen is soft. There is no hepatomegaly, splenomegaly or mass.     Tenderness: There is no abdominal tenderness. There is no right CVA tenderness, left CVA tenderness, guarding or rebound.     Hernia: No hernia is present.  Musculoskeletal:        General: No swelling, tenderness, deformity or signs of injury. Normal range of motion.     Cervical back: Normal range of motion and neck supple. No rigidity or tenderness.     Right lower leg: 1+ Edema present.     Left lower leg: 1+  Edema present.  Lymphadenopathy:     Cervical: No cervical adenopathy.     Right cervical: No superficial, deep or posterior cervical adenopathy.    Left cervical: No superficial, deep or posterior cervical adenopathy.     Upper Body:     Right upper body: No supraclavicular, axillary or pectoral adenopathy.     Left upper body: No supraclavicular, axillary or pectoral adenopathy.     Lower Body: No right inguinal adenopathy. No left inguinal adenopathy.  Skin:    General: Skin is warm and dry.     Coloration: Skin is not jaundiced or pale.     Findings: No bruising, erythema, lesion or rash.  Neurological:     General: No focal deficit present.     Mental Status: She is alert and oriented to person, place, and time. Mental status is at baseline.     Cranial Nerves: No cranial nerve deficit.     Sensory: No sensory deficit.     Motor: No weakness.  Coordination: Coordination normal.     Gait: Gait normal.     Deep Tendon Reflexes: Reflexes normal.  Psychiatric:        Mood and Affect: Mood normal.        Behavior: Behavior normal.        Thought Content: Thought content normal.        Judgment: Judgment normal.    LABS:      Latest Ref Rng & Units 07/12/2023    9:23 AM 06/30/2023    9:00 AM 06/14/2023    9:38 AM  CBC  WBC 4.0 - 10.5 K/uL 5.3  6.4  5.6   Hemoglobin 12.0 - 15.0 g/dL 16.1  09.6  04.5   Hematocrit 36.0 - 46.0 % 37.3  36.9  36.8   Platelets 150 - 400 K/uL 181  180  205       Latest Ref Rng & Units 07/12/2023    9:23 AM 06/30/2023    9:00 AM 06/14/2023    9:38 AM  CMP  Glucose 70 - 99 mg/dL 409  811  914   BUN 8 - 23 mg/dL 22  21  22    Creatinine 0.44 - 1.00 mg/dL 7.82  9.56  2.13   Sodium 135 - 145 mmol/L 139  140  141   Potassium 3.5 - 5.1 mmol/L 3.6  3.8  4.4   Chloride 98 - 111 mmol/L 106  104  107   CO2 22 - 32 mmol/L 20  22  23    Calcium  8.9 - 10.3 mg/dL 9.3  9.6  9.4   Total Protein 6.5 - 8.1 g/dL 6.2  6.3  6.4   Total Bilirubin 0.0 - 1.2 mg/dL  0.4  0.6  0.4   Alkaline Phos 38 - 126 U/L 91  98  107   AST 15 - 41 U/L 17  21  28    ALT 0 - 44 U/L 18  22  26     Lab Results  Component Value Date   CEA1 2.9 09/10/2021   /  CEA  Date Value Ref Range Status  09/10/2021 2.9 0.0 - 4.7 ng/mL Final    Comment:    (NOTE)                             Nonsmokers          <3.9                             Smokers             <5.6 [employer] Electrochemiluminescence Immunoassay (ECLIA) Values obtained with different assay methods or kits cannot be used interchangeably.  Results cannot be interpreted as absolute evidence of the presence or absence of malignant disease. Performed At: [facility] [address] [place], [place] [id number] Pearlean Botts MD GE:[id number]    Lab Results  Component Value Date   TSH 1.647 06/30/2023   T4TOTAL 9.4 06/14/2023   STUDIES:  CT CHEST ABDOMEN PELVIS W CONTRAST Result Date: 06/30/2023 EXAMINATION: CT CHEST ABDOMEN PELVIS W CONTRAST CLINICAL INDICATION: Female, 88 years old. Gastric cancer, monitor TECHNIQUE: Axial CT of the chest, abdomen, and pelvis with 80 mL Omnipaque  300 intravenous contrast. Multiplanar reformations provided. Unless otherwise specified, incidental thyroid , adrenal, renal lesions do not require dedicated imaging follow up. Additionally, any mentioned pulmonary nodules do not require dedicated imaging follow-up based  on the Fleischner guidelines unless otherwise specified. Coronary calcifications are not identified unless otherwise specified. COMPARISON: 04/07/2023 FINDINGS: CHEST: Left chest wall Mediport catheter tip terminates in the SVC. The visualized thyroid  is normal. The thoracic aorta is normal in caliber. Scattered atherosclerotic changes are present in the main pulmonary artery is normal in caliber. The heart is normal in size. There is no free fluid or pathologic lymphadenopathy by size criteria. Calcified mediastinal and right hilar lymph nodes are noted. The  trachea and mainstem bronchi are patent. There is scarring in the lung bases. There is similar pulmonary nodules in the lung bases with the largest measuring 6 mm in the left lower lobe. ABDOMEN/PELVIS: The liver appears normal. The gallbladder is normal. The spleen is normal. There is similar nonspecific gastric wall thickening without discrete focal mass appreciated. The adrenals are normal. The pancreas appears normal. The kidneys are normal other than a right renal cyst. The abdominal aorta is normal in caliber. Scattered atherosclerotic changes are present. Bladder is normal. The uterus is surgically absent. Large and small bowel loops are otherwise within normal limits. There is no free fluid. No pathologic adenopathy by size criteria. Subtle peritoneal nodularity is noted inferior to the gastric antrum and body. This appears overall similar compared to the prior study. BONES: There are degenerative changes of the spine and bony pelvis. IMPRESSION: Persistent nonspecific diffuse gastric wall thickening with persistent subtle peritoneal nodularity inferior to the gastric body and antrum. The possibility of early peritoneal carcinomatosis cannot entirely be excluded. No convincing evidence for metastatic disease within the chest. Stable pulmonary nodules measuring up to 6 mm. Attention on follow-up. DOSE REDUCTION: All CT scans are performed using radiation dose reduction techniques, when applicable. Technical factors are evaluated and adjusted to ensure appropriate moderation of exposure. Electronically signed by: [place] Engel MD 06/30/2023 03:06 PM EDT RP Workstation: [id number]    EXAM: 05/03/2023 Right LOWER EXTREMITY VENOUS DOPPLER ULTRASOUND IMPRESSION: Occlusive thrombus along the popliteal vein and peroneal vein. Critical Value/emergent results were called by telephone at the time of interpretation on 05/03/2023 at 1:44 pm to provider [facility] , who verbally acknowledged these results.  EXAM:  03/07/2023 CT CHEST, ABDOMEN, AND PELVIS WITH CONTRAST IMPRESSION: 1. Similar mild distal gastric wall thickening with perigastric fat stranding/soft tissue thickening. No new suspicious finding. 2. No evidence of metastatic disease within the chest, abdomen, or pelvis. 3. Stable small solid pulmonary nodules in the left lung base measuring up to 6 mm, favored benign. 4. Diffuse hepatic steatosis. 5.  Aortic Atherosclerosis (ICD10-I70.0).  EXAM: 03/07/2023 DIGITAL SCREENING BILATERAL MAMMOGRAM WITH TOMOSYNTHESIS AND CAD IMPRESSION: No mammographic evidence of malignancy. A result letter of this screening mammogram will be mailed directly to the patient.  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  If that happens, she is to call us  so that we can schedule her for an ultrasound to look for synovitis.     Essential hypertension 09/09/2016  Gastric cancer (HCC) 09/10/2021   Hyperlipidemia 09/01/2016   Hypertension    Hypothyroidism 09/01/2016   Osteoarthritis of both feet 01/20/2016   Osteoarthritis, hand 01/20/2016   Thyroid  disease     Past Surgical History:  Procedure Laterality Date   APPENDECTOMY     LAPAROSCOPIC APPENDECTOMY N/A 04/10/2016   Procedure: APPENDECTOMY LAPAROSCOPIC;  Surgeon: Oza Blumenthal, MD;  Location: [facility] OR;  Service: General;  Laterality: N/A;    Family History  Problem Relation Age  of Onset   Hypertension Mother    Prostate cancer Father        metastatic; d. 55   Brain cancer Sister 103   Breast cancer Sister 71   AAA (abdominal aortic aneurysm) Brother    Leukemia Cousin        x2 maternal female cousins; d. before 10   Breast cancer Daughter 62       DCIS    Social History:  reports that she has never smoked. She has never used smokeless tobacco. She reports that she does not currently use alcohol . She reports that she does not use drugs.The patient is accompanied by her daughter today.  Allergies:  Allergies  Allergen Reactions   Doxycycline Rash   Sulfa Antibiotics Other (See Comments) and Rash    Other reaction(s): Other (See Comments)  "Made me feel weird"    Current Medications: Current Outpatient Medications  Medication Sig Dispense Refill   hydrochlorothiazide (HYDRODIURIL) 25 MG tablet Take 25 mg by mouth daily.     amLODipine (NORVASC) 5 MG tablet Take 5 mg by mouth daily.     apixaban  (ELIQUIS ) 5 MG TABS tablet Take 1 tablet (5 mg total) by mouth 2 (two) times daily. 180 tablet 1   b complex vitamins capsule Take 1 capsule by mouth daily.     Calcium  Carbonate (CALCIUM  600 PO) Take 1 tablet by mouth daily.     Cholecalciferol (VITAMIN D3) 5000 units CAPS Take 1 capsule by mouth daily.     famotidine (PEPCID) 40 MG tablet Take 40 mg by mouth at bedtime.     KRILL OIL PO Take 1 capsule by mouth daily. Unknown strenght     Lactobacillus TABS Take 1 tablet by mouth 2 (two) times daily.     levothyroxine  (SYNTHROID , LEVOTHROID) 75 MCG tablet Take 75 mcg by mouth daily before breakfast.      LORazepam  (ATIVAN ) 0.5 MG tablet Take 1 tablet (0.5 mg total) by mouth every 8 (eight) hours. 30 tablet 0   NON FORMULARY Take 1 Dose by mouth See admin instructions. MMW: 3 parts Maalox 2 parts Benadryl  1 part viscious lidicaine  Disp. 6oz  Instructions: 5ml swish and swallow every 3-4 hours     omeprazole  (PRILOSEC) 40 MG capsule Take 1 capsule (40 mg  total) by mouth 2 (two) times daily. 60 capsule 5   ondansetron  (ZOFRAN -ODT) 4 MG disintegrating tablet Take 1 tablet (4 mg total) by mouth every 8 (eight) hours as needed for nausea or vomiting. 90 tablet 0   polyethylene glycol powder (GLYCOLAX /MIRALAX ) 17 GM/SCOOP powder Take 1 Container by mouth daily.     potassium chloride  SA (KLOR-CON  M) 20 MEQ tablet TAKE 1 TABLET TWICE A DAY 180 tablet 3   pravastatin  (PRAVACHOL ) 20 MG tablet Take 1 tablet (20 mg total) by mouth every evening. 90 tablet 1   Probiotic Product (PROBIOTIC DAILY PO) Take 1 tablet by mouth daily.     prochlorperazine  (COMPAZINE ) 10 MG tablet Take 1 tablet (  10 mg total) by mouth every 6 (six) hours as needed for nausea or vomiting. 90 tablet 3   valsartan (DIOVAN) 160 MG tablet Take 160 mg by mouth daily.     No current facility-administered medications for this visit.   Facility-Administered Medications Ordered in Other Visits  Medication Dose Route Frequency Provider Last Rate Last Admin   diphenhydrAMINE  (BENADRYL ) capsule 25 mg  25 mg Oral Q4H PRN Setsuko Robins H, MD   25 mg at 07/12/23 8119   heparin  lock flush 100 unit/mL  500 Units Intracatheter Once PRN Nolia Baumgartner, MD       sodium chloride  flush (NS) 0.9 % injection 3 mL  3 mL Intracatheter Once PRN Melchor Kirchgessner H, MD       I,Jasmine M Lassiter,acting as a scribe for Nolia Baumgartner, MD.,have documented all relevant documentation on the behalf of Nolia Baumgartner, MD,as directed by  Nolia Baumgartner, MD while in the presence of Nolia Baumgartner, MD.

## 2023-07-05 ENCOUNTER — Inpatient Hospital Stay (HOSPITAL_BASED_OUTPATIENT_CLINIC_OR_DEPARTMENT_OTHER): Admitting: Oncology

## 2023-07-05 ENCOUNTER — Encounter: Payer: Self-pay | Admitting: Oncology

## 2023-07-05 ENCOUNTER — Inpatient Hospital Stay

## 2023-07-05 VITALS — BP 136/73 | HR 85 | Temp 98.2°F | Resp 18 | Ht 64.0 in | Wt 211.9 lb

## 2023-07-05 DIAGNOSIS — C169 Malignant neoplasm of stomach, unspecified: Secondary | ICD-10-CM | POA: Diagnosis not present

## 2023-07-05 DIAGNOSIS — Z7901 Long term (current) use of anticoagulants: Secondary | ICD-10-CM | POA: Diagnosis not present

## 2023-07-05 DIAGNOSIS — R7989 Other specified abnormal findings of blood chemistry: Secondary | ICD-10-CM

## 2023-07-05 DIAGNOSIS — E860 Dehydration: Secondary | ICD-10-CM

## 2023-07-05 DIAGNOSIS — C168 Malignant neoplasm of overlapping sites of stomach: Secondary | ICD-10-CM

## 2023-07-05 DIAGNOSIS — Z79899 Other long term (current) drug therapy: Secondary | ICD-10-CM | POA: Diagnosis not present

## 2023-07-05 DIAGNOSIS — E876 Hypokalemia: Secondary | ICD-10-CM

## 2023-07-05 DIAGNOSIS — Z86718 Personal history of other venous thrombosis and embolism: Secondary | ICD-10-CM | POA: Diagnosis not present

## 2023-07-05 DIAGNOSIS — E039 Hypothyroidism, unspecified: Secondary | ICD-10-CM | POA: Diagnosis not present

## 2023-07-05 DIAGNOSIS — Z5111 Encounter for antineoplastic chemotherapy: Secondary | ICD-10-CM | POA: Diagnosis not present

## 2023-07-05 MED ORDER — DEXTROSE 5 % IV SOLN
320.0000 mg/m2 | Freq: Once | INTRAVENOUS | Status: AC
  Administered 2023-07-05: 630 mg via INTRAVENOUS
  Filled 2023-07-05: qty 31.5

## 2023-07-05 MED ORDER — FLUOROURACIL CHEMO INJECTION 2.5 GM/50ML
320.0000 mg/m2 | Freq: Once | INTRAVENOUS | Status: AC
  Administered 2023-07-05: 650 mg via INTRAVENOUS
  Filled 2023-07-05: qty 13

## 2023-07-05 MED ORDER — DEXAMETHASONE SODIUM PHOSPHATE 10 MG/ML IJ SOLN
10.0000 mg | Freq: Once | INTRAMUSCULAR | Status: AC
  Administered 2023-07-05: 10 mg via INTRAVENOUS
  Filled 2023-07-05: qty 1

## 2023-07-05 MED ORDER — PALONOSETRON HCL INJECTION 0.25 MG/5ML
0.2500 mg | Freq: Once | INTRAVENOUS | Status: AC
  Administered 2023-07-05: 0.25 mg via INTRAVENOUS
  Filled 2023-07-05: qty 5

## 2023-07-05 MED ORDER — DEXTROSE 5 % IV SOLN: Freq: Once | INTRAVENOUS | Status: AC

## 2023-07-05 MED ORDER — SODIUM CHLORIDE 0.9 % IV SOLN
1960.0000 mg/m2 | INTRAVENOUS | Status: DC
  Administered 2023-07-05: 3850 mg via INTRAVENOUS
  Filled 2023-07-05: qty 77

## 2023-07-05 MED ORDER — SODIUM CHLORIDE 0.9 % IV SOLN: Freq: Once | INTRAVENOUS | Status: DC

## 2023-07-05 MED ORDER — SODIUM CHLORIDE 0.9 % IV SOLN: Freq: Once | INTRAVENOUS | Status: AC

## 2023-07-05 MED ORDER — SODIUM CHLORIDE 0.9 % IV SOLN
240.0000 mg | Freq: Once | INTRAVENOUS | Status: AC
  Administered 2023-07-05: 240 mg via INTRAVENOUS
  Filled 2023-07-05: qty 24

## 2023-07-05 NOTE — Patient Instructions (Signed)
The chemotherapy medication bag should finish at 46 hours For example, if your pump is scheduled for 46 hours and it was put on at 4:00 p.m., it should finish at 2:00 p.m. the day it is scheduled to come off regardless of your appointment time.     Estimated time to finish at 1100.   If the display on your pump reads "Low Volume" and it is beeping, take the batteries out of the pump and come to the cancer center for it to be taken off.   If the pump alarms go off prior to the pump reading "Low Volume" then call [phone] and someone can assist you.  If the plunger comes out and the chemotherapy medication is leaking out, please use your home chemo spill kit to clean up the spill. Do NOT use paper towels or other household products.  If you have problems or questions regarding your pump, please call either [phone] (24 hours a day) or the cancer center Monday-Friday 8:00 a.m.- 4:30 p.m. at the clinic number and we will assist you. If you are unable to get assistance, then go to the nearest Emergency Department and ask the staff to contact the IV team for assistance.

## 2023-07-07 ENCOUNTER — Inpatient Hospital Stay

## 2023-07-07 ENCOUNTER — Encounter: Payer: Self-pay | Admitting: Oncology

## 2023-07-07 VITALS — BP 136/76 | HR 76 | Temp 97.9°F | Resp 18 | Ht 64.0 in | Wt 212.1 lb

## 2023-07-07 DIAGNOSIS — Z5111 Encounter for antineoplastic chemotherapy: Secondary | ICD-10-CM | POA: Diagnosis not present

## 2023-07-07 DIAGNOSIS — E039 Hypothyroidism, unspecified: Secondary | ICD-10-CM | POA: Diagnosis not present

## 2023-07-07 DIAGNOSIS — Z7901 Long term (current) use of anticoagulants: Secondary | ICD-10-CM | POA: Diagnosis not present

## 2023-07-07 DIAGNOSIS — C169 Malignant neoplasm of stomach, unspecified: Secondary | ICD-10-CM | POA: Diagnosis not present

## 2023-07-07 DIAGNOSIS — Z86718 Personal history of other venous thrombosis and embolism: Secondary | ICD-10-CM | POA: Diagnosis not present

## 2023-07-07 DIAGNOSIS — C168 Malignant neoplasm of overlapping sites of stomach: Secondary | ICD-10-CM

## 2023-07-07 DIAGNOSIS — Z79899 Other long term (current) drug therapy: Secondary | ICD-10-CM | POA: Diagnosis not present

## 2023-07-07 MED ORDER — SODIUM CHLORIDE 0.9 % IV SOLN: Freq: Once | INTRAVENOUS | Status: AC

## 2023-07-07 MED ORDER — HEPARIN SOD (PORK) LOCK FLUSH 100 UNIT/ML IV SOLN
500.0000 [IU] | Freq: Once | INTRAVENOUS | Status: AC | PRN
  Administered 2023-07-07: 500 [IU]

## 2023-07-07 MED ORDER — PROCHLORPERAZINE MALEATE 10 MG PO TABS
10.0000 mg | ORAL_TABLET | Freq: Once | ORAL | Status: AC
  Administered 2023-07-07: 10 mg via ORAL
  Filled 2023-07-07: qty 1

## 2023-07-07 MED ORDER — SODIUM CHLORIDE 0.9% FLUSH
10.0000 mL | INTRAVENOUS | Status: DC | PRN
  Administered 2023-07-07: 10 mL

## 2023-07-07 NOTE — Patient Instructions (Signed)

## 2023-07-07 NOTE — Progress Notes (Signed)
 Pt requesting nausea medication.  Kelli Mosher, PA notified-order received.

## 2023-07-12 ENCOUNTER — Encounter: Payer: Self-pay | Admitting: Oncology

## 2023-07-12 ENCOUNTER — Inpatient Hospital Stay

## 2023-07-12 VITALS — BP 151/86 | HR 70 | Temp 97.8°F | Resp 18

## 2023-07-12 DIAGNOSIS — E860 Dehydration: Secondary | ICD-10-CM

## 2023-07-12 DIAGNOSIS — Z79899 Other long term (current) drug therapy: Secondary | ICD-10-CM | POA: Diagnosis not present

## 2023-07-12 DIAGNOSIS — Z86718 Personal history of other venous thrombosis and embolism: Secondary | ICD-10-CM | POA: Diagnosis not present

## 2023-07-12 DIAGNOSIS — E039 Hypothyroidism, unspecified: Secondary | ICD-10-CM | POA: Diagnosis not present

## 2023-07-12 DIAGNOSIS — Z5111 Encounter for antineoplastic chemotherapy: Secondary | ICD-10-CM | POA: Diagnosis not present

## 2023-07-12 DIAGNOSIS — C169 Malignant neoplasm of stomach, unspecified: Secondary | ICD-10-CM | POA: Diagnosis not present

## 2023-07-12 DIAGNOSIS — E876 Hypokalemia: Secondary | ICD-10-CM

## 2023-07-12 DIAGNOSIS — Z7901 Long term (current) use of anticoagulants: Secondary | ICD-10-CM | POA: Diagnosis not present

## 2023-07-12 DIAGNOSIS — R7989 Other specified abnormal findings of blood chemistry: Secondary | ICD-10-CM

## 2023-07-12 LAB — CMP (CANCER CENTER ONLY)
ALT: 18 U/L (ref 0–44)
AST: 17 U/L (ref 15–41)
Albumin: 3.9 g/dL (ref 3.5–5.0)
Alkaline Phosphatase: 91 U/L (ref 38–126)
Anion gap: 14 (ref 5–15)
BUN: 22 mg/dL (ref 8–23)
CO2: 20 mmol/L — ABNORMAL LOW (ref 22–32)
Calcium: 9.3 mg/dL (ref 8.9–10.3)
Chloride: 106 mmol/L (ref 98–111)
Creatinine: 1.34 mg/dL — ABNORMAL HIGH (ref 0.44–1.00)
GFR, Estimated: 38 mL/min — ABNORMAL LOW (ref >60)
Glucose, Bld: 193 mg/dL — ABNORMAL HIGH (ref 70–99)
Potassium: 3.6 mmol/L (ref 3.5–5.1)
Sodium: 139 mmol/L (ref 135–145)
Total Bilirubin: 0.4 mg/dL (ref 0.0–1.2)
Total Protein: 6.2 g/dL — ABNORMAL LOW (ref 6.5–8.1)

## 2023-07-12 LAB — CBC WITH DIFFERENTIAL (CANCER CENTER ONLY)
Abs Immature Granulocytes: 0.02 10*3/uL (ref 0.00–0.07)
Basophils Absolute: 0 10*3/uL (ref 0.0–0.1)
Basophils Relative: 1 %
Eosinophils Absolute: 0.3 10*3/uL (ref 0.0–0.5)
Eosinophils Relative: 6 %
HCT: 37.3 % (ref 36.0–46.0)
Hemoglobin: 12.5 g/dL (ref 12.0–15.0)
Immature Granulocytes: 0 %
Lymphocytes Relative: 34 %
Lymphs Abs: 1.8 10*3/uL (ref 0.7–4.0)
MCH: 28.7 pg (ref 26.0–34.0)
MCHC: 33.5 g/dL (ref 30.0–36.0)
MCV: 85.7 fL (ref 80.0–100.0)
Monocytes Absolute: 0.5 10*3/uL (ref 0.1–1.0)
Monocytes Relative: 9 %
Neutro Abs: 2.7 10*3/uL (ref 1.7–7.7)
Neutrophils Relative %: 50 %
Platelet Count: 181 10*3/uL (ref 150–400)
RBC: 4.35 MIL/uL (ref 3.87–5.11)
RDW: 13.9 % (ref 11.5–15.5)
WBC Count: 5.3 10*3/uL (ref 4.0–10.5)
nRBC: 0 % (ref 0.0–0.2)

## 2023-07-12 MED ORDER — HEPARIN SOD (PORK) LOCK FLUSH 100 UNIT/ML IV SOLN
500.0000 [IU] | Freq: Once | INTRAVENOUS | Status: AC | PRN
  Administered 2023-07-12: 500 [IU]

## 2023-07-12 MED ORDER — SODIUM CHLORIDE 0.9 % IV SOLN: Freq: Once | INTRAVENOUS | Status: AC

## 2023-07-12 MED ORDER — ALTEPLASE 2 MG IJ SOLR
2.0000 mg | Freq: Once | INTRAMUSCULAR | Status: AC | PRN
  Administered 2023-07-12: 2 mg
  Filled 2023-07-12: qty 2

## 2023-07-12 MED ORDER — SODIUM CHLORIDE 0.9% FLUSH: 3.0000 mL | Freq: Once | INTRAVENOUS | Status: DC | PRN

## 2023-07-12 MED ORDER — DIPHENHYDRAMINE HCL 25 MG PO CAPS
25.0000 mg | ORAL_CAPSULE | ORAL | Status: DC | PRN
  Administered 2023-07-12: 25 mg via ORAL
  Filled 2023-07-12: qty 1

## 2023-07-12 NOTE — Patient Instructions (Signed)

## 2023-07-14 DIAGNOSIS — H40019 Open angle with borderline findings, low risk, unspecified eye: Secondary | ICD-10-CM | POA: Diagnosis not present

## 2023-07-19 ENCOUNTER — Inpatient Hospital Stay: Attending: Hematology and Oncology

## 2023-07-19 ENCOUNTER — Inpatient Hospital Stay

## 2023-07-19 VITALS — BP 162/79 | HR 86 | Temp 98.9°F | Resp 20

## 2023-07-19 DIAGNOSIS — R197 Diarrhea, unspecified: Secondary | ICD-10-CM | POA: Insufficient documentation

## 2023-07-19 DIAGNOSIS — W19XXXA Unspecified fall, initial encounter: Secondary | ICD-10-CM | POA: Insufficient documentation

## 2023-07-19 DIAGNOSIS — C168 Malignant neoplasm of overlapping sites of stomach: Secondary | ICD-10-CM

## 2023-07-19 DIAGNOSIS — Z5111 Encounter for antineoplastic chemotherapy: Secondary | ICD-10-CM | POA: Insufficient documentation

## 2023-07-19 DIAGNOSIS — D72829 Elevated white blood cell count, unspecified: Secondary | ICD-10-CM | POA: Insufficient documentation

## 2023-07-19 DIAGNOSIS — E876 Hypokalemia: Secondary | ICD-10-CM

## 2023-07-19 DIAGNOSIS — E860 Dehydration: Secondary | ICD-10-CM

## 2023-07-19 DIAGNOSIS — Z79899 Other long term (current) drug therapy: Secondary | ICD-10-CM | POA: Insufficient documentation

## 2023-07-19 DIAGNOSIS — R7989 Other specified abnormal findings of blood chemistry: Secondary | ICD-10-CM

## 2023-07-19 DIAGNOSIS — C169 Malignant neoplasm of stomach, unspecified: Secondary | ICD-10-CM | POA: Insufficient documentation

## 2023-07-19 DIAGNOSIS — S9032XA Contusion of left foot, initial encounter: Secondary | ICD-10-CM | POA: Insufficient documentation

## 2023-07-19 DIAGNOSIS — Z7901 Long term (current) use of anticoagulants: Secondary | ICD-10-CM | POA: Diagnosis not present

## 2023-07-19 MED ORDER — HEPARIN SOD (PORK) LOCK FLUSH 100 UNIT/ML IV SOLN
500.0000 [IU] | Freq: Once | INTRAVENOUS | Status: AC | PRN
  Administered 2023-07-19: 500 [IU]

## 2023-07-19 MED ORDER — SODIUM CHLORIDE 0.9 % IV SOLN: Freq: Once | INTRAVENOUS | Status: AC

## 2023-07-19 NOTE — Patient Instructions (Signed)

## 2023-07-25 ENCOUNTER — Ambulatory Visit: Attending: Cardiology | Admitting: Cardiology

## 2023-07-25 ENCOUNTER — Encounter: Payer: Self-pay | Admitting: Cardiology

## 2023-07-25 VITALS — BP 142/80 | HR 101 | Ht 64.0 in | Wt 210.4 lb

## 2023-07-25 DIAGNOSIS — I10 Essential (primary) hypertension: Secondary | ICD-10-CM | POA: Insufficient documentation

## 2023-07-25 DIAGNOSIS — R7303 Prediabetes: Secondary | ICD-10-CM | POA: Insufficient documentation

## 2023-07-25 DIAGNOSIS — E782 Mixed hyperlipidemia: Secondary | ICD-10-CM | POA: Diagnosis not present

## 2023-07-25 DIAGNOSIS — C168 Malignant neoplasm of overlapping sites of stomach: Secondary | ICD-10-CM | POA: Insufficient documentation

## 2023-07-25 DIAGNOSIS — R0609 Other forms of dyspnea: Secondary | ICD-10-CM | POA: Insufficient documentation

## 2023-07-25 NOTE — Patient Instructions (Signed)

## 2023-07-25 NOTE — Progress Notes (Unsigned)
 Cardiology Office Note:    Date:  07/25/2023   ID:  Mackenzie Lewis, DOB August 05, 1935, MRN [id number]  PCP:  Olan Bering, MD  Cardiologist:  Ralene Burger, MD    Referring MD: Olan Bering, MD   Chief Complaint  Patient presents with   Follow-up    History of Present Illness:    Mackenzie Lewis is a 88 y.o. female past medical history significant for essential hypertension, chronic kidney failure, dyslipidemia initially she was sent to me because of epigastric discomfort but eventually she was found to have stomach cancer that being aggressively treated by oncology team she seems to be doing fine.  Recently she also ended up having a right lower extremity DVT treated with anticoagulation. Comes today 2 months for follow-up overall cardiac wise doing well.  Denies have any chest pain tightness squeezing pressure burning chest no palpitation dizziness swelling of lower extremities after admit for her condition and her age she is doing quite well  Past Medical History:  Diagnosis Date   Abdominal pain 01/17/2023   Appendicitis with peritonitis 04/10/2016   Atypical chest pain 09/09/2016   Benign hypertensive renal disease 09/01/2016   Bilateral primary osteoarthritis of knee 01/20/2016   Borderline diabetes 09/09/2016   CKD (chronic kidney disease), stage II 09/01/2016   Cyclic citrullinated peptide (CCP) antibody positive 01/20/2016   Because she has positive CCP, I want to make sure we monitor the patient closely and we encouraged the patient to look for symptoms that include increased hand stiffness, swelling and redness to the MCP joint.  If that happens, she is to call us  so that we can schedule her for an ultrasound to look for synovitis.     Essential hypertension 09/09/2016   Gastric cancer (HCC) 09/10/2021   Hyperlipidemia 09/01/2016   Hypertension    Hypothyroidism 09/01/2016   Osteoarthritis of both feet 01/20/2016   Osteoarthritis, hand 01/20/2016   Thyroid   disease     Past Surgical History:  Procedure Laterality Date   APPENDECTOMY     LAPAROSCOPIC APPENDECTOMY N/A 04/10/2016   Procedure: APPENDECTOMY LAPAROSCOPIC;  Surgeon: Oza Blumenthal, MD;  Location: [facility] OR;  Service: General;  Laterality: N/A;    Current Medications: Current Meds  Medication Sig   apixaban  (ELIQUIS ) 5 MG TABS tablet Take 1 tablet (5 mg total) by mouth 2 (two) times daily.   b complex vitamins capsule Take 1 capsule by mouth daily.   Calcium  Carbonate (CALCIUM  600 PO) Take 1 tablet by mouth daily.   Cholecalciferol (VITAMIN D3) 5000 units CAPS Take 1 capsule by mouth daily.   famotidine (PEPCID) 40 MG tablet Take 40 mg by mouth at bedtime.   hydrochlorothiazide (HYDRODIURIL) 25 MG tablet Take 25 mg by mouth daily.   KRILL OIL PO Take 1 capsule by mouth daily. Unknown strenght   Lactobacillus TABS Take 1 tablet by mouth 2 (two) times daily.   levothyroxine  (SYNTHROID , LEVOTHROID) 75 MCG tablet Take 75 mcg by mouth daily before breakfast.    LORazepam  (ATIVAN ) 0.5 MG tablet Take 1 tablet (0.5 mg total) by mouth every 8 (eight) hours.   NON FORMULARY Take 1 Dose by mouth See admin instructions. MMW: 3 parts Maalox 2 parts Benadryl  1 part viscious lidicaine  Disp. 6oz  Instructions: 5ml swish and swallow every 3-4 hours   omeprazole  (PRILOSEC) 40 MG capsule Take 1 capsule (40 mg total) by mouth 2 (two) times daily.   ondansetron  (ZOFRAN -ODT) 4 MG disintegrating tablet Take 1 tablet (4 mg  total) by mouth every 8 (eight) hours as needed for nausea or vomiting.   polyethylene glycol powder (GLYCOLAX /MIRALAX ) 17 GM/SCOOP powder Take 1 Container by mouth daily.   potassium chloride  SA (KLOR-CON  M) 20 MEQ tablet TAKE 1 TABLET TWICE A DAY   pravastatin  (PRAVACHOL ) 20 MG tablet Take 1 tablet (20 mg total) by mouth every evening.   Probiotic Product (PROBIOTIC DAILY PO) Take 1 tablet by mouth daily.   prochlorperazine  (COMPAZINE ) 10 MG tablet Take 1 tablet (10 mg total)  by mouth every 6 (six) hours as needed for nausea or vomiting.   valsartan (DIOVAN) 160 MG tablet Take 160 mg by mouth daily.   [DISCONTINUED] amLODipine (NORVASC) 5 MG tablet Take 5 mg by mouth daily.     Allergies:   Doxycycline and Sulfa antibiotics   Social History   Socioeconomic History   Marital status: Divorced    Spouse name: Not on file   Number of children: 2   Years of education: 12   Highest education level: 12th grade  Occupational History   Occupation: RETIRED  Tobacco Use   Smoking status: Never   Smokeless tobacco: Never  Vaping Use   Vaping status: Never Used  Substance and Sexual Activity   Alcohol  use: Not Currently    Comment: occasionally/ 1 time per month   Drug use: Never   Sexual activity: Not Currently  Other Topics Concern   Not on file  Social History Narrative   Not on file   Social Drivers of Health   Financial Resource Strain: Low Risk  (01/04/2022)   Overall Financial Resource Strain (CARDIA)    Difficulty of Paying Living Expenses: Not hard at all  Food Insecurity: No Food Insecurity (01/04/2022)   Hunger Vital Sign    Worried About Running Out of Food in the Last Year: Never true    Ran Out of Food in the Last Year: Never true  Transportation Needs: No Transportation Needs (01/04/2022)   PRAPARE - Administrator, Civil Service (Medical): No    Lack of Transportation (Non-Medical): No  Physical Activity: Inactive (01/04/2022)   Exercise Vital Sign    Days of Exercise per Week: 0 days    Minutes of Exercise per Session: 0 min  Stress: No Stress Concern Present (01/04/2022)   [employer] of Occupational Health - Occupational Stress Questionnaire    Feeling of Stress : Only a little  Social Connections: Socially Integrated (01/04/2022)   Social Connection and Isolation Panel [NHANES]    Frequency of Communication with Friends and Family: More than three times a week    Frequency of Social Gatherings with Friends and  Family: More than three times a week    Attends Religious Services: More than 4 times per year    Active Member of [employer] or Organizations: Yes    Attends Engineer, structural: More than 4 times per year    Marital Status: Living with partner     Family History: The patient's family history includes AAA (abdominal aortic aneurysm) in her brother; Brain cancer (age of onset: 16) in her sister; Breast cancer (age of onset: 105) in her daughter; Breast cancer (age of onset: 63) in her sister; Hypertension in her mother; Leukemia in her cousin; Prostate cancer in her father. ROS:   Please see the history of present illness.    All 14 point review of systems negative except as described per history of present illness  EKGs/Labs/Other Studies Reviewed:  EKG Interpretation Date/Time:  Monday July 25 2023 08:24:34 EDT Ventricular Rate:  101 PR Interval:  178 QRS Duration:  100 QT Interval:  352 QTC Calculation: 456 R Axis:   61  Text Interpretation: Sinus tachycardia with frequent and consecutive Premature ventricular complexes Abnormal ECG When compared with ECG of 10-Apr-2016 21:28, PREVIOUS ECG IS PRESENT Confirmed by Ralene Burger [phone]) on 07/25/2023 8:48:19 AM    Recent Labs: 06/30/2023: TSH 1.647 07/12/2023: ALT 18; BUN 22; Creatinine 1.34; Hemoglobin 12.5; Platelet Count 181; Potassium 3.6; Sodium 139  Recent Lipid Panel    Component Value Date/Time   CHOL 176 06/10/2022 0853   CHOL 181 03/19/2020 1148   TRIG 145 06/10/2022 0853   HDL 77 06/10/2022 0853   HDL 64 03/19/2020 1148   CHOLHDL 2.3 06/10/2022 0853   VLDL 29 06/10/2022 0853   LDLCALC 70 06/10/2022 0853   LDLCALC 92 03/19/2020 1148    Physical Exam:    VS:  BP (!) 142/80 (BP Location: Left Arm, Patient Position: Sitting)   Pulse (!) 101   Ht 5\' 4"  (1.626 m)   Wt 210 lb 6.4 oz (95.4 kg)   SpO2 94%   BMI 36.12 kg/m     Wt Readings from Last 3 Encounters:  07/25/23 210 lb 6.4 oz (95.4 kg)   07/07/23 212 lb 1.3 oz (96.2 kg)  07/05/23 211 lb 14.4 oz (96.1 kg)     GEN:  Well nourished, well developed in no acute distress HEENT: Normal NECK: No JVD; No carotid bruits LYMPHATICS: No lymphadenopathy CARDIAC: RRR, no murmurs, no rubs, no gallops RESPIRATORY:  Clear to auscultation without rales, wheezing or rhonchi  ABDOMEN: Soft, non-tender, non-distended MUSCULOSKELETAL:  No edema; No deformity  SKIN: Warm and dry LOWER EXTREMITIES: no swelling NEUROLOGIC:  Alert and oriented x 3 PSYCHIATRIC:  Normal affect   ASSESSMENT:    1. Essential hypertension   2. Mixed hyperlipidemia   3. Borderline diabetes   4. Malignant neoplasm of overlapping sites of stomach (HCC)    PLAN:    In order of problems listed above:  Essential hypertension blood pressure well-controlled continue present management. Dyslipidemia need to be KPN LDL 70 HDL 67 continue present management. Malignant stomach cancer stable treated excellently like always by our oncology team. History of DVT.  On anticoagulation will try will continue. Swelling of lower extremities minimal today I suspect it was related to DVT however I will do echocardiogram to assess left ventricle ejection fraction   Medication Adjustments/Labs and Tests Ordered: Current medicines are reviewed at length with the patient today.  Concerns regarding medicines are outlined above.  Orders Placed This Encounter  Procedures   EKG 12-Lead   Medication changes: No orders of the defined types were placed in this encounter.   Signed, Manfred Seed, MD, [facility] 07/25/2023 9:02 AM    [facility]

## 2023-07-26 ENCOUNTER — Inpatient Hospital Stay

## 2023-07-26 ENCOUNTER — Encounter: Payer: Self-pay | Admitting: Oncology

## 2023-07-26 ENCOUNTER — Ambulatory Visit (HOSPITAL_BASED_OUTPATIENT_CLINIC_OR_DEPARTMENT_OTHER)
Admission: RE | Admit: 2023-07-26 | Discharge: 2023-07-26 | Disposition: A | Discharge status: Home / Self Care | Source: Ambulatory Visit | Attending: Hematology and Oncology | Admitting: Hematology and Oncology

## 2023-07-26 ENCOUNTER — Inpatient Hospital Stay (HOSPITAL_BASED_OUTPATIENT_CLINIC_OR_DEPARTMENT_OTHER): Admitting: Hematology and Oncology

## 2023-07-26 ENCOUNTER — Encounter: Payer: Self-pay | Admitting: Hematology and Oncology

## 2023-07-26 ENCOUNTER — Other Ambulatory Visit: Payer: Self-pay

## 2023-07-26 VITALS — BP 138/64 | HR 92 | Temp 98.0°F | Resp 18 | Ht 64.0 in | Wt 210.5 lb

## 2023-07-26 DIAGNOSIS — I82431 Acute embolism and thrombosis of right popliteal vein: Secondary | ICD-10-CM | POA: Insufficient documentation

## 2023-07-26 DIAGNOSIS — S9032XA Contusion of left foot, initial encounter: Secondary | ICD-10-CM | POA: Diagnosis not present

## 2023-07-26 DIAGNOSIS — C168 Malignant neoplasm of overlapping sites of stomach: Secondary | ICD-10-CM

## 2023-07-26 DIAGNOSIS — R197 Diarrhea, unspecified: Secondary | ICD-10-CM | POA: Diagnosis not present

## 2023-07-26 DIAGNOSIS — M79672 Pain in left foot: Secondary | ICD-10-CM

## 2023-07-26 DIAGNOSIS — D72829 Elevated white blood cell count, unspecified: Secondary | ICD-10-CM | POA: Diagnosis not present

## 2023-07-26 DIAGNOSIS — C169 Malignant neoplasm of stomach, unspecified: Secondary | ICD-10-CM | POA: Diagnosis not present

## 2023-07-26 DIAGNOSIS — Z7901 Long term (current) use of anticoagulants: Secondary | ICD-10-CM | POA: Diagnosis not present

## 2023-07-26 DIAGNOSIS — M7732 Calcaneal spur, left foot: Secondary | ICD-10-CM | POA: Diagnosis not present

## 2023-07-26 DIAGNOSIS — N142 Nephropathy induced by unspecified drug, medicament or biological substance: Secondary | ICD-10-CM | POA: Diagnosis not present

## 2023-07-26 DIAGNOSIS — Z5111 Encounter for antineoplastic chemotherapy: Secondary | ICD-10-CM | POA: Diagnosis not present

## 2023-07-26 LAB — CBC WITH DIFFERENTIAL (CANCER CENTER ONLY)
Abs Immature Granulocytes: 0.02 10*3/uL (ref 0.00–0.07)
Basophils Absolute: 0 10*3/uL (ref 0.0–0.1)
Basophils Relative: 1 %
Eosinophils Absolute: 0.2 10*3/uL (ref 0.0–0.5)
Eosinophils Relative: 4 %
HCT: 36.4 % (ref 36.0–46.0)
Hemoglobin: 11.8 g/dL — ABNORMAL LOW (ref 12.0–15.0)
Immature Granulocytes: 0 %
Lymphocytes Relative: 30 %
Lymphs Abs: 2 10*3/uL (ref 0.7–4.0)
MCH: 28 pg (ref 26.0–34.0)
MCHC: 32.4 g/dL (ref 30.0–36.0)
MCV: 86.3 fL (ref 80.0–100.0)
Monocytes Absolute: 0.8 10*3/uL (ref 0.1–1.0)
Monocytes Relative: 13 %
Neutro Abs: 3.5 10*3/uL (ref 1.7–7.7)
Neutrophils Relative %: 52 %
Platelet Count: 209 10*3/uL (ref 150–400)
RBC: 4.22 MIL/uL (ref 3.87–5.11)
RDW: 14.4 % (ref 11.5–15.5)
WBC Count: 6.6 10*3/uL (ref 4.0–10.5)
nRBC: 0 % (ref 0.0–0.2)

## 2023-07-26 LAB — CMP (CANCER CENTER ONLY)
ALT: 21 U/L (ref 0–44)
AST: 22 U/L (ref 15–41)
Albumin: 4.3 g/dL (ref 3.5–5.0)
Alkaline Phosphatase: 104 U/L (ref 38–126)
Anion gap: 12 (ref 5–15)
BUN: 22 mg/dL (ref 8–23)
CO2: 22 mmol/L (ref 22–32)
Calcium: 9.6 mg/dL (ref 8.9–10.3)
Chloride: 106 mmol/L (ref 98–111)
Creatinine: 1.49 mg/dL — ABNORMAL HIGH (ref 0.44–1.00)
GFR, Estimated: 34 mL/min — ABNORMAL LOW (ref >60)
Glucose, Bld: 168 mg/dL — ABNORMAL HIGH (ref 70–99)
Potassium: 3.9 mmol/L (ref 3.5–5.1)
Sodium: 140 mmol/L (ref 135–145)
Total Bilirubin: 0.4 mg/dL (ref 0.0–1.2)
Total Protein: 6.2 g/dL — ABNORMAL LOW (ref 6.5–8.1)

## 2023-07-26 LAB — T4, FREE: Free T4: 1.09 ng/dL (ref 0.61–1.12)

## 2023-07-26 LAB — TSH: TSH: 1.97 u[IU]/mL (ref 0.350–4.500)

## 2023-07-26 MED ORDER — SODIUM CHLORIDE 0.9 % IV SOLN: Freq: Once | INTRAVENOUS | Status: AC

## 2023-07-26 MED ORDER — PALONOSETRON HCL INJECTION 0.25 MG/5ML
0.2500 mg | Freq: Once | INTRAVENOUS | Status: AC
  Administered 2023-07-26: 0.25 mg via INTRAVENOUS
  Filled 2023-07-26: qty 5

## 2023-07-26 MED ORDER — SODIUM CHLORIDE 0.9% FLUSH: 10.0000 mL | INTRAVENOUS | Status: DC | PRN

## 2023-07-26 MED ORDER — SODIUM CHLORIDE 0.9 % IV SOLN
1960.0000 mg/m2 | INTRAVENOUS | Status: DC
  Administered 2023-07-26: 3850 mg via INTRAVENOUS
  Filled 2023-07-26: qty 77

## 2023-07-26 MED ORDER — LEUCOVORIN CALCIUM INJECTION 350 MG
320.0000 mg/m2 | Freq: Once | INTRAVENOUS | Status: AC
  Administered 2023-07-26: 630 mg via INTRAVENOUS
  Filled 2023-07-26: qty 31.5

## 2023-07-26 MED ORDER — DEXAMETHASONE SODIUM PHOSPHATE 10 MG/ML IJ SOLN
10.0000 mg | Freq: Once | INTRAMUSCULAR | Status: AC
  Administered 2023-07-26: 10 mg via INTRAVENOUS
  Filled 2023-07-26: qty 1

## 2023-07-26 MED ORDER — HEPARIN SOD (PORK) LOCK FLUSH 100 UNIT/ML IV SOLN
500.0000 [IU] | Freq: Once | INTRAVENOUS | Status: DC | PRN
Start: 2023-07-26 — End: 2023-07-26

## 2023-07-26 MED ORDER — FLUOROURACIL CHEMO INJECTION 2.5 GM/50ML
320.0000 mg/m2 | Freq: Once | INTRAVENOUS | Status: AC
  Administered 2023-07-26: 650 mg via INTRAVENOUS
  Filled 2023-07-26: qty 13

## 2023-07-26 MED ORDER — DEXTROSE 5 % IV SOLN: Freq: Once | INTRAVENOUS | Status: AC

## 2023-07-26 MED ORDER — SODIUM CHLORIDE 0.9 % IV SOLN
240.0000 mg | Freq: Once | INTRAVENOUS | Status: AC
  Administered 2023-07-26: 240 mg via INTRAVENOUS
  Filled 2023-07-26: qty 24

## 2023-07-26 NOTE — Progress Notes (Signed)
 [facility]  [address] [place],  [place]  [address] [phone]  Clinic Day:  07/26/2023  Referring physician: Olan Bering, MD  ASSESSMENT & PLAN:   Assessment & Plan: Gastric cancer (HCC) Stage IVB (T4 N0 M1) poorly differentiated adenocarcinoma of the stomach with signet ring features and ulceration, metastatic to the omentum.  Stain for HER2 was negative.  PET scan revealed omental involvement. She received palliative chemotherapy with FOLFOX/nivolumab  (5-fluorouracil /leucovorin /oxaliplatin /nivolumab ). Oxaliplatin  was discontinued after 11 cycles, and she continues 5-fluorouracil /leucovorin /nivolumab .  PET in February 2024 revealed resolution of metabolic activity associated with the stomach.  Decrease in size of omental nodularity with no associated metabolic activity.   EGD in March revealed residual disease.  CT imaging every few months has remained stable. 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  She receives extra IV fluids with her infusion to prevent recurrent acute kidney injury.  She otherwise continues to tolerate treatment well.  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   Her disease has remained stable with treatment but still persists, so we recommended we continue the maintenance treatment and repeat imaging in 3 months. Most recent CT chest, abdomen and pelvis in May revealed persistent nonspecific diffuse gastric wall thickening with persistent subtle peritoneal nodularity inferior to the gastric body and antrum. Stable small solid pulmonary nodules in the left lung base measuring up to 6 mm, favored benign etiology. Diffuse hepatic steatosis. Following that, we changed her schedule from every 2 weeks to every 3  weeks.  She will proceed with 39th cycle of 5-fluorouracil /leucovorin /nivolumab  cycle today.  Her creatinine is higher than previous, so she will get additional IV fluids today in addition to Thursday with her pump DC.  She will continue to receive IV fluids weekly except for the week she is on vacation.  We will plan to see her back in 4 weeks with a CBC, comprehensive metabolic panel, and TSH prior to a 40th cycle.  Drug-induced interstitial nephritis History immune mediated nephritis from immunotherapy in February, which resolved with prednisone .  She has had chronic kidney disease since that episode.  Her creatinine is slightly higher than previous at 1.49.  She will receive IV fluids today and after her pump DC on Thursday.  She will continue to get weekly fluids.  She will skip the week that she is on vacation.  She knows to push clear fluids.    Acute deep vein thrombosis (DVT) of popliteal vein of right lower extremity (HCC) Occlusive deep venous thrombosis along the popliteal and peroneal vein in March 2025. She was placed on apixaban  10 mg twice daily for 7 days, then apixaban  5 mg twice daily indefinitely.  Aspirin 81 mg daily was discontinued.  The patient states she continues apixaban  5 mg twice daily without difficulty.  Left foot pain Pain and tenderness of the left foot arch after twisting injury and fall yesterday.  I will have her go for plain films today to evaluate.  She has bruise of the left medial knee with slight abrasion since the fall.  There is no calf tenderness or increased swelling of the left leg.    The patient understands the plans discussed today and is in agreement with them.  She knows to contact our office if she develops concerns prior to  her next appointment.   I provided 30 minutes of face-to-face time during this encounter and > 50% was spent counseling as documented under my assessment and plan.    Alfonso Ike, PA-C  [facility]  [facility] [place] - A DEPT OF [facility] [address] [place] [address] Dept: [phone] Dept Fax: [phone]   Orders Placed This Encounter  Procedures   DG Foot Complete Left    Standing Status:   Future    Number of Occurrences:   1    Expected Date:   07/26/2023    Expiration Date:   07/25/2024    Reason for Exam (SYMPTOM  OR DIAGNOSIS REQUIRED):   left foot pain with ambulation after fall    Preferred imaging location?:   [facility]      CHIEF COMPLAINT:  CC: Stage IVB gastric cancer  Current Treatment: 5-fluorouracil /leucovorin /nivolumab   HISTORY OF PRESENT ILLNESS:   Oncology History  Gastric cancer (HCC)  09/10/2021 Initial Diagnosis   Gastric cancer (HCC)   09/10/2021 Cancer Staging   Staging form: Stomach, AJCC 8th Edition - Clinical stage from 09/10/2021: Stage IVB (cT4b, cN0, cM1) - Signed by Nolia Baumgartner, MD on 09/10/2021 Histopathologic type: Adenocarcinoma, NOS Stage prefix: Initial diagnosis Total positive nodes: 0 Histologic grade (G): G3 Histologic grading system: 3 grade system Sites of metastasis: Peritoneal surface Diagnostic confirmation: Positive histology PLUS positive immunophenotyping and/or positive genetic studies Specimen type: Endoscopy with Biopsy Staged by: Managing physician Carcinoembryonic antigen (CEA) (ng/mL): 2.8 Carbohydrate antigen 19-9 (CA 19-9) (U/mL): 4.9 HER2 status: Unknown Microsatellite instability (MSI): Unknown Tumor location in stomach: Other Clinical staging modalities: Biopsy, Endoscopy Stage used in treatment planning: Yes National guidelines used in treatment planning: Yes Type of national guideline used in treatment planning: NCCN   09/28/2021 - 10/14/2021 Chemotherapy   Patient is on Treatment Plan : GASTROESOPHAGEAL FOLFOX + Nivolumab  q14d     09/28/2021 -  Chemotherapy   Patient is on Treatment Plan : GASTROESOPHAGEAL FOLFOX + Nivolumab  q21d (changed from q14d on  04/12/23)     12/09/2021 Genetic Testing   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  Report date is 12/09/2021.   The Multi-Cancer + RNA Panel offered by [facility] includes sequencing and/or deletion/duplication analysis of the following 84 genes:  AIP*, ALK, APC*, ATM*, AXIN2*, BAP1*, BARD1*, BLM*, BMPR1A*, BRCA1*, BRCA2*, BRIP1*, CASR, CDC73*, CDH1*, CDK4, CDKN1B*, CDKN1C*, CDKN2A, CEBPA, CHEK2*, CTNNA1*, DICER1*, DIS3L2*, EGFR, EPCAM, FH*, FLCN*, GATA2*, GPC3, GREM1, HOXB13, HRAS, KIT, MAX*, MEN1*, MET, MITF, MLH1*, MSH2*, MSH3*, MSH6*, MUTYH*, NBN*, NF1*, NF2*, NTHL1*, PALB2*, PDGFRA, PHOX2B, PMS2*, POLD1*, POLE*, POT1*, PRKAR1A*, PTCH1*, PTEN*, RAD50*, RAD51C*, RAD51D*, RB1*, RECQL4, RET, RUNX1*, SDHA*, SDHAF2*, SDHB*, SDHC*, SDHD*, SMAD4*, SMARCA4*, SMARCB1*, SMARCE1*, STK11*, SUFU*, TERC, TERT, TMEM127*, Tp53*, TSC1*, TSC2*, VHL*, WRN*, and WT1.  RNA analysis is performed for * genes.       INTERVAL HISTORY:   Mackenzie Lewis is here today for repeat clinical assessment. She reports some increased nausea since the last cycle, relieved with medication.  She denies diarrhea.  She denies cough.  She denies skin rashes or pruritus.  She denies fevers or chills.  Her appetite is decreased. Her weight has been stable.  She fell yesterday in the yard twisting her left foot, and bruising her left knee and right arm.  She states the arch of left foot hurts when she walks.  She is taking vitamin B complex daily as recommended for her  neuropathy.  REVIEW OF SYSTEMS:   Review of Systems  Constitutional:  Negative for appetite change, chills, diaphoresis, fatigue, fever and unexpected weight change.  HENT:   Negative for lump/mass, mouth sores, sore throat and trouble swallowing.   Respiratory:  Negative for cough and shortness of breath.   Cardiovascular:  Negative for chest pain and leg swelling.  Gastrointestinal:  Positive for nausea. Negative for abdominal pain,  constipation, diarrhea and vomiting.  Genitourinary:  Negative for difficulty urinating, dysuria, frequency and hematuria.   Musculoskeletal:  Positive for arthralgias (left foot). Negative for back pain, gait problem, myalgias and neck pain.  Skin:  Negative for itching and rash.  Neurological:  Negative for dizziness, extremity weakness, gait problem, headaches, light-headedness and numbness.  Hematological:  Negative for adenopathy. Does not bruise/bleed easily.  Psychiatric/Behavioral:  Negative for depression and sleep disturbance. The patient is not nervous/anxious.      VITALS:   Blood pressure 138/64, pulse 92, temperature 98 F (36.7 C), temperature source Oral, resp. rate 18, height 5\' 4"  (1.626 m), weight 210 lb 8 oz (95.5 kg), SpO2 95%.  Wt Readings from Last 3 Encounters:  07/26/23 210 lb 8 oz (95.5 kg)  07/25/23 210 lb 6.4 oz (95.4 kg)  07/07/23 212 lb 1.3 oz (96.2 kg)    Body mass index is 36.13 kg/m.  Performance status (ECOG): 1 - Symptomatic but completely ambulatory    PHYSICAL EXAM:   Physical Exam Vitals and nursing note reviewed.  Constitutional:      General: She is not in acute distress.    Appearance: Normal appearance.  HENT:     Head: Normocephalic and atraumatic.     Mouth/Throat:     Mouth: Mucous membranes are dry.     Pharynx: Oropharynx is clear. No oropharyngeal exudate or posterior oropharyngeal erythema.  Eyes:     General: No scleral icterus.    Extraocular Movements: Extraocular movements intact.     Conjunctiva/sclera: Conjunctivae normal.     Pupils: Pupils are equal, round, and reactive to light.  Cardiovascular:     Rate and Rhythm: Normal rate and regular rhythm.     Heart sounds: Normal heart sounds. No murmur heard.    No friction rub. No gallop.  Pulmonary:     Effort: Pulmonary effort is normal.     Breath sounds: Normal breath sounds. No wheezing, rhonchi or rales.  Abdominal:     General: There is no distension.      Palpations: Abdomen is soft. There is no hepatomegaly, splenomegaly or mass.     Tenderness: There is no abdominal tenderness.  Musculoskeletal:        General: Normal range of motion.     Cervical back: Normal range of motion and neck supple. No tenderness.     Right lower leg: No tenderness. Edema (trace) present.     Left lower leg: No tenderness. Edema (trace) present.     Comments: 2 to 3 cm bruise/hematoma of the left medial knee.  No calf tenderness bilaterally.  Feet:     Comments: Tenderness of the left foot arch Lymphadenopathy:     Cervical: No cervical adenopathy.     Upper Body:     Right upper body: No supraclavicular or axillary adenopathy.     Left upper body: No supraclavicular or axillary adenopathy.     Lower Body: No right inguinal adenopathy. No left inguinal adenopathy.  Skin:    General: Skin is warm and dry.  Coloration: Skin is not jaundiced.     Findings: Bruising (left medial knee with hematoma) and ecchymosis (right distal arm) present. No rash.  Neurological:     Mental Status: She is alert and oriented to person, place, and time.     Cranial Nerves: No cranial nerve deficit.  Psychiatric:        Mood and Affect: Mood normal.        Behavior: Behavior normal.        Thought Content: Thought content normal.     LABS:      Latest Ref Rng & Units 07/26/2023    8:42 AM 07/12/2023    9:23 AM 06/30/2023    9:00 AM  CBC  WBC 4.0 - 10.5 K/uL 6.6  5.3  6.4   Hemoglobin 12.0 - 15.0 g/dL 16.1  09.6  04.5   Hematocrit 36.0 - 46.0 % 36.4  37.3  36.9   Platelets 150 - 400 K/uL 209  181  180       Latest Ref Rng & Units 07/26/2023    8:42 AM 07/12/2023    9:23 AM 06/30/2023    9:00 AM  CMP  Glucose 70 - 99 mg/dL 409  811  914   BUN 8 - 23 mg/dL 22  22  21    Creatinine 0.44 - 1.00 mg/dL 7.82  9.56  2.13   Sodium 135 - 145 mmol/L 140  139  140   Potassium 3.5 - 5.1 mmol/L 3.9  3.6  3.8   Chloride 98 - 111 mmol/L 106  106  104   CO2 22 - 32 mmol/L 22  20   22    Calcium  8.9 - 10.3 mg/dL 9.6  9.3  9.6   Total Protein 6.5 - 8.1 g/dL 6.2  6.2  6.3   Total Bilirubin 0.0 - 1.2 mg/dL 0.4  0.4  0.6   Alkaline Phos 38 - 126 U/L 104  91  98   AST 15 - 41 U/L 22  17  21    ALT 0 - 44 U/L 21  18  22       Lab Results  Component Value Date   CEA1 2.9 09/10/2021   /  CEA  Date Value Ref Range Status  09/10/2021 2.9 0.0 - 4.7 ng/mL Final    Comment:    (NOTE)                             Nonsmokers          <3.9                             Smokers             <5.6 [employer] Electrochemiluminescence Immunoassay (ECLIA) Values obtained with different assay methods or kits cannot be used interchangeably.  Results cannot be interpreted as absolute evidence of the presence or absence of malignant disease. Performed At: [facility] [address] [place], [place] [id number] Pearlean Botts MD GE:[id number]    No results found for: "PSA1" No results found for: "[id number]" No results found for: "CAN125"  No results found for: "TOTALPROTELP", "ALBUMINELP", "A1GS", "A2GS", "BETS", "BETA2SER", "GAMS", "MSPIKE", "SPEI" No results found for: "TIBC", "FERRITIN", "IRONPCTSAT" No results found for: "LDH"  STUDIES:   CT CHEST ABDOMEN PELVIS W CONTRAST Result Date: 06/30/2023 EXAMINATION: CT CHEST ABDOMEN PELVIS W CONTRAST CLINICAL INDICATION: Female, 88  years old. Gastric cancer, monitor TECHNIQUE: Axial CT of the chest, abdomen, and pelvis with 80 mL Omnipaque  300 intravenous contrast. Multiplanar reformations provided. Unless otherwise specified, incidental thyroid , adrenal, renal lesions do not require dedicated imaging follow up. Additionally, any mentioned pulmonary nodules do not require dedicated imaging follow-up based on the Fleischner guidelines unless otherwise specified. Coronary calcifications are not identified unless otherwise specified. COMPARISON: 04/07/2023 FINDINGS: CHEST: Left chest wall Mediport catheter tip terminates in the  SVC. The visualized thyroid  is normal. The thoracic aorta is normal in caliber. Scattered atherosclerotic changes are present in the main pulmonary artery is normal in caliber. The heart is normal in size. There is no free fluid or pathologic lymphadenopathy by size criteria. Calcified mediastinal and right hilar lymph nodes are noted. The trachea and mainstem bronchi are patent. There is scarring in the lung bases. There is similar pulmonary nodules in the lung bases with the largest measuring 6 mm in the left lower lobe. ABDOMEN/PELVIS: The liver appears normal. The gallbladder is normal. The spleen is normal. There is similar nonspecific gastric wall thickening without discrete focal mass appreciated. The adrenals are normal. The pancreas appears normal. The kidneys are normal other than a right renal cyst. The abdominal aorta is normal in caliber. Scattered atherosclerotic changes are present. Bladder is normal. The uterus is surgically absent. Large and small bowel loops are otherwise within normal limits. There is no free fluid. No pathologic adenopathy by size criteria. Subtle peritoneal nodularity is noted inferior to the gastric antrum and body. This appears overall similar compared to the prior study. BONES: There are degenerative changes of the spine and bony pelvis. IMPRESSION: Persistent nonspecific diffuse gastric wall thickening with persistent subtle peritoneal nodularity inferior to the gastric body and antrum. The possibility of early peritoneal carcinomatosis cannot entirely be excluded. No convincing evidence for metastatic disease within the chest. Stable pulmonary nodules measuring up to 6 mm. Attention on follow-up. DOSE REDUCTION: All CT scans are performed using radiation dose reduction techniques, when applicable. Technical factors are evaluated and adjusted to ensure appropriate moderation of exposure. 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  If that happens, she is to call us  so that we can schedule her for an ultrasound to look for synovitis.     Essential hypertension 09/09/2016   Gastric cancer (HCC) 09/10/2021   Hyperlipidemia 09/01/2016   Hypertension    Hypothyroidism 09/01/2016   Osteoarthritis of both feet 01/20/2016   Osteoarthritis, hand 01/20/2016   Thyroid  disease     Past Surgical History:  Procedure Laterality Date   APPENDECTOMY     LAPAROSCOPIC APPENDECTOMY N/A 04/10/2016   Procedure: APPENDECTOMY LAPAROSCOPIC;  Surgeon: Oza Blumenthal, MD;  Location: [facility] OR;  Service: General;  Laterality: N/A;    Family History  Problem Relation Age of Onset   Hypertension Mother  Prostate cancer Father        metastatic; d. 44   Brain cancer Sister 15   Breast cancer Sister 11   AAA (abdominal aortic aneurysm) Brother    Leukemia Cousin        x2 maternal female cousins; d. before 4   Breast cancer Daughter 78       DCIS    Social History:  reports that she has never smoked. She has never used smokeless tobacco. She reports that she does not currently use alcohol . She reports that she does not use drugs.The patient is accompanied by her daughter today.  Allergies:  Allergies  Allergen Reactions   Doxycycline Rash   Sulfa Antibiotics Other (See Comments) and Rash    Other  reaction(s): Other (See Comments)  "Made me feel weird"    Current Medications: Current Outpatient Medications  Medication Sig Dispense Refill   apixaban  (ELIQUIS ) 5 MG TABS tablet Take 1 tablet (5 mg total) by mouth 2 (two) times daily. 180 tablet 1   b complex vitamins capsule Take 1 capsule by mouth daily.     Calcium  Carbonate (CALCIUM  600 PO) Take 1 tablet by mouth daily.     Cholecalciferol (VITAMIN D3) 5000 units CAPS Take 1 capsule by mouth daily.     famotidine (PEPCID) 40 MG tablet Take 40 mg by mouth at bedtime.     hydrochlorothiazide (HYDRODIURIL) 25 MG tablet Take 25 mg by mouth daily.     KRILL OIL PO Take 1 capsule by mouth daily. Unknown strenght     Lactobacillus TABS Take 1 tablet by mouth 2 (two) times daily.     levothyroxine  (SYNTHROID , LEVOTHROID) 75 MCG tablet Take 75 mcg by mouth daily before breakfast.      LORazepam  (ATIVAN ) 0.5 MG tablet Take 1 tablet (0.5 mg total) by mouth every 8 (eight) hours. 30 tablet 0   NON FORMULARY Take 1 Dose by mouth See admin instructions. MMW: 3 parts Maalox 2 parts Benadryl  1 part viscious lidicaine  Disp. 6oz  Instructions: 5ml swish and swallow every 3-4 hours     omeprazole  (PRILOSEC) 40 MG capsule Take 1 capsule (40 mg total) by mouth 2 (two) times daily. 60 capsule 5   ondansetron  (ZOFRAN -ODT) 4 MG disintegrating tablet Take 1 tablet (4 mg total) by mouth every 8 (eight) hours as needed for nausea or vomiting. 90 tablet 0   polyethylene glycol powder (GLYCOLAX /MIRALAX ) 17 GM/SCOOP powder Take 1 Container by mouth daily.     potassium chloride  SA (KLOR-CON  M) 20 MEQ tablet TAKE 1 TABLET TWICE A DAY 180 tablet 3   pravastatin  (PRAVACHOL ) 20 MG tablet Take 1 tablet (20 mg total) by mouth every evening. 90 tablet 1   Probiotic Product (PROBIOTIC DAILY PO) Take 1 tablet by mouth daily.     prochlorperazine  (COMPAZINE ) 10 MG tablet Take 1 tablet (10 mg total) by mouth every 6 (six) hours as needed for nausea or vomiting. 90  tablet 3   valsartan (DIOVAN) 160 MG tablet Take 160 mg by mouth daily.     No current facility-administered medications for this visit.

## 2023-07-26 NOTE — Assessment & Plan Note (Signed)
 Occlusive deep venous thrombosis along the popliteal and peroneal vein in March 2025. She was placed on apixaban  10 mg twice daily for 7 days, then apixaban  5 mg twice daily indefinitely.  Aspirin 81 mg daily was discontinued.  The patient states she continues apixaban  5 mg twice daily without difficulty.

## 2023-07-26 NOTE — Assessment & Plan Note (Addendum)
 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  Stain for HER2 was negative.  PET scan revealed omental involvement. She received palliative chemotherapy with FOLFOX/nivolumab  (5-fluorouracil /leucovorin /oxaliplatin /nivolumab ). Oxaliplatin  was discontinued after 11 cycles, and she continues 5-fluorouracil /leucovorin /nivolumab .  PET in February 2024 revealed resolution of metabolic activity associated with the stomach.  Decrease in size of omental nodularity with no associated metabolic activity.   EGD in March revealed residual disease.  CT imaging every few months has remained stable. 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  She receives extra IV fluids with her infusion to prevent recurrent acute kidney injury.  She otherwise continues to tolerate treatment well.  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   Her disease has remained stable with treatment but still persists, so we recommended we continue the maintenance treatment and repeat imaging in 3 months. Most recent CT chest, abdomen and pelvis in May revealed persistent nonspecific diffuse gastric wall thickening with persistent subtle peritoneal nodularity inferior to the gastric body and antrum. Stable small solid pulmonary nodules in the left lung base measuring up to 6 mm, favored benign etiology. Diffuse hepatic steatosis. Following that, we changed her schedule from every 2 weeks to every 3 weeks.  She will proceed with 39th cycle of 5-fluorouracil /leucovorin /nivolumab  cycle today.  Her creatinine is higher than previous, so she will get additional IV fluids today in addition to Thursday with her pump DC.  She will  continue to receive IV fluids weekly except for the week she is on vacation.  We will plan to see her back in 4 weeks with a CBC, comprehensive metabolic panel, and TSH prior to a 40th cycle.

## 2023-07-26 NOTE — Assessment & Plan Note (Signed)
 Pain and tenderness of the left foot arch after twisting injury and fall yesterday.  I will have her go for plain films today to evaluate.  She has bruise of the left medial knee with slight abrasion since the fall.  There is no calf tenderness or increased swelling of the left leg.

## 2023-07-26 NOTE — Assessment & Plan Note (Signed)
 History immune mediated nephritis from immunotherapy in February, which resolved with prednisone .  She has had chronic kidney disease since that episode.  Her creatinine is slightly higher than previous at 1.49.  She will receive IV fluids today and after her pump DC on Thursday.  She will continue to get weekly fluids.  She will skip the week that she is on vacation.  She knows to push clear fluids.

## 2023-07-26 NOTE — Patient Instructions (Signed)
Fluorouracil Injection What is this medication? FLUOROURACIL (flure oh YOOR a sil) treats some types of cancer. It works by slowing down the growth of cancer cells. This medicine may be used for other purposes; ask your health care provider or pharmacist if you have questions. COMMON BRAND NAME(S): Adrucil What should I tell my care team before I take this medication? They need to know if you have any of these conditions: Blood disorders Dihydropyrimidine dehydrogenase (DPD) deficiency Infection, such as chickenpox, cold sores, herpes Kidney disease Liver disease Poor nutrition Recent or ongoing radiation therapy An unusual or allergic reaction to fluorouracil, other medications, foods, dyes, or preservatives If you or your partner are pregnant or trying to get pregnant Breast-feeding How should I use this medication? This medication is injected into a vein. It is administered by your care team in a hospital or clinic setting. Talk to your care team about the use of this medication in children. Special care may be needed. Overdosage: If you think you have taken too much of this medicine contact a poison control center or emergency room at once. NOTE: This medicine is only for you. Do not share this medicine with others. What if I miss a dose? Keep appointments for follow-up doses. It is important not to miss your dose. Call your care team if you are unable to keep an appointment. What may interact with this medication? Do not take this medication with any of the following: Live virus vaccines This medication may also interact with the following: Medications that treat or prevent blood clots, such as warfarin, enoxaparin, dalteparin This list may not describe all possible interactions. Give your health care provider a list of all the medicines, herbs, non-prescription drugs, or dietary supplements you use. Also tell them if you smoke, drink alcohol, or use illegal drugs. Some items may  interact with your medicine. What should I watch for while using this medication? Your condition will be monitored carefully while you are receiving this medication. This medication may make you feel generally unwell. This is not uncommon as chemotherapy can affect healthy cells as well as cancer cells. Report any side effects. Continue your course of treatment even though you feel ill unless your care team tells you to stop. In some cases, you may be given additional medications to help with side effects. Follow all directions for their use. This medication may increase your risk of getting an infection. Call your care team for advice if you get a fever, chills, sore throat, or other symptoms of a cold or flu. Do not treat yourself. Try to avoid being around people who are sick. This medication may increase your risk to bruise or bleed. Call your care team if you notice any unusual bleeding. Be careful brushing or flossing your teeth or using a toothpick because you may get an infection or bleed more easily. If you have any dental work done, tell your dentist you are receiving this medication. Avoid taking medications that contain aspirin, acetaminophen, ibuprofen, naproxen, or ketoprofen unless instructed by your care team. These medications may hide a fever. Do not treat diarrhea with over the counter products. Contact your care team if you have diarrhea that lasts more than 2 days or if it is severe and watery. This medication can make you more sensitive to the sun. Keep out of the sun. If you cannot avoid being in the sun, wear protective clothing and sunscreen. Do not use sun lamps, tanning beds, or tanning booths. Talk to   your care team if you or your partner wish to become pregnant or think you might be pregnant. This medication can cause serious birth defects if taken during pregnancy and for 3 months after the last dose. A reliable form of contraception is recommended while taking this  medication and for 3 months after the last dose. Talk to your care team about effective forms of contraception. Do not father a child while taking this medication and for 3 months after the last dose. Use a condom while having sex during this time period. Do not breastfeed while taking this medication. This medication may cause infertility. Talk to your care team if you are concerned about your fertility. What side effects may I notice from receiving this medication? Side effects that you should report to your care team as soon as possible: Allergic reactions--skin rash, itching, hives, swelling of the face, lips, tongue, or throat Heart attack--pain or tightness in the chest, shoulders, arms, or jaw, nausea, shortness of breath, cold or clammy skin, feeling faint or lightheaded Heart failure--shortness of breath, swelling of the ankles, feet, or hands, sudden weight gain, unusual weakness or fatigue Heart rhythm changes--fast or irregular heartbeat, dizziness, feeling faint or lightheaded, chest pain, trouble breathing High ammonia level--unusual weakness or fatigue, confusion, loss of appetite, nausea, vomiting, seizures Infection--fever, chills, cough, sore throat, wounds that don't heal, pain or trouble when passing urine, general feeling of discomfort or being unwell Low red blood cell level--unusual weakness or fatigue, dizziness, headache, trouble breathing Pain, tingling, or numbness in the hands or feet, muscle weakness, change in vision, confusion or trouble speaking, loss of balance or coordination, trouble walking, seizures Redness, swelling, and blistering of the skin over hands and feet Severe or prolonged diarrhea Unusual bruising or bleeding Side effects that usually do not require medical attention (report to your care team if they continue or are bothersome): Dry skin Headache Increased tears Nausea Pain, redness, or swelling with sores inside the mouth or throat Sensitivity  to light Vomiting This list may not describe all possible side effects. Call your doctor for medical advice about side effects. You may report side effects to FDA at 1-800-FDA-1088. Where should I keep my medication? This medication is given in a hospital or clinic. It will not be stored at home. NOTE: This sheet is a summary. It may not cover all possible information. If you have questions about this medicine, talk to your doctor, pharmacist, or health care provider.  2024 [employer] Standard (2021-06-09 00:00:00) Leucovorin Injection What is this medication? LEUCOVORIN (loo koe VOR in) prevents side effects from certain medications, such as methotrexate. It works by increasing folate levels. This helps protect healthy cells in your body. It may also be used to treat anemia caused by low levels of folate. It can also be used with fluorouracil, a type of chemotherapy, to treat colorectal cancer. It works by increasing the effects of fluorouracil in the body. This medicine may be used for other purposes; ask your health care provider or pharmacist if you have questions. What should I tell my care team before I take this medication? They need to know if you have any of these conditions: Anemia from low levels of vitamin B12 in the blood An unusual or allergic reaction to leucovorin, folic acid, other medications, foods, dyes, or preservatives Pregnant or trying to get pregnant Breastfeeding How should I use this medication? This medication is injected into a vein or a muscle. It is given by your care team   in a hospital or clinic setting. Talk to your care team about the use of this medication in children. Special care may be needed. Overdosage: If you think you have taken too much of this medicine contact a poison control center or emergency room at once. NOTE: This medicine is only for you. Do not share this medicine with others. What if I miss a dose? Keep appointments for follow-up doses.  It is important not to miss your dose. Call your care team if you are unable to keep an appointment. What may interact with this medication? Capecitabine Fluorouracil Phenobarbital Phenytoin Primidone Trimethoprim;sulfamethoxazole This list may not describe all possible interactions. Give your health care provider a list of all the medicines, herbs, non-prescription drugs, or dietary supplements you use. Also tell them if you smoke, drink alcohol, or use illegal drugs. Some items may interact with your medicine. What should I watch for while using this medication? Your condition will be monitored carefully while you are receiving this medication. This medication may increase the side effects of 5-fluorouracil. Tell your care team if you have diarrhea or mouth sores that do not get better or that get worse. What side effects may I notice from receiving this medication? Side effects that you should report to your care team as soon as possible: Allergic reactions--skin rash, itching, hives, swelling of the face, lips, tongue, or throat This list may not describe all possible side effects. Call your doctor for medical advice about side effects. You may report side effects to FDA at 1-800-FDA-1088. Where should I keep my medication? This medication is given in a hospital or clinic. It will not be stored at home. NOTE: This sheet is a summary. It may not cover all possible information. If you have questions about this medicine, talk to your doctor, pharmacist, or health care provider.  2024 [employer] Standard (2021-07-07 00:00:00) Nivolumab Injection What is this medication? NIVOLUMAB (nye VOL ue mab) treats some types of cancer. It works by helping your immune system slow or stop the spread of cancer cells. It is a monoclonal antibody. This medicine may be used for other purposes; ask your health care provider or pharmacist if you have questions. COMMON BRAND NAME(S): Opdivo What should I  tell my care team before I take this medication? They need to know if you have any of these conditions: Allogeneic stem cell transplant (uses someone else's stem cells) Autoimmune diseases, such as Crohn disease, ulcerative colitis, lupus History of chest radiation Nervous system problems, such as Guillain-Barre syndrome or myasthenia gravis Organ transplant An unusual or allergic reaction to nivolumab, other medications, foods, dyes, or preservatives Pregnant or trying to get pregnant Breast-feeding How should I use this medication? This medication is infused into a vein. It is given in a hospital or clinic setting. A special MedGuide will be given to you before each treatment. Be sure to read this information carefully each time. Talk to your care team about the use of this medication in children. While it may be prescribed for children as young as 12 years for selected conditions, precautions do apply. Overdosage: If you think you have taken too much of this medicine contact a poison control center or emergency room at once. NOTE: This medicine is only for you. Do not share this medicine with others. What if I miss a dose? Keep appointments for follow-up doses. It is important not to miss your dose. Call your care team if you are unable to keep an appointment. What may   interact with this medication? Interactions have not been studied. This list may not describe all possible interactions. Give your health care provider a list of all the medicines, herbs, non-prescription drugs, or dietary supplements you use. Also tell them if you smoke, drink alcohol, or use illegal drugs. Some items may interact with your medicine. What should I watch for while using this medication? Your condition will be monitored carefully while you are receiving this medication. You may need blood work while taking this medication. This medication may cause serious skin reactions. They can happen weeks to months after  starting the medication. Contact your care team right away if you notice fevers or flu-like symptoms with a rash. The rash may be red or purple and then turn into blisters or peeling of the skin. You may also notice a red rash with swelling of the face, lips, or lymph nodes in your neck or under your arms. Tell your care team right away if you have any change in your eyesight. Talk to your care team if you are pregnant or think you might be pregnant. A negative pregnancy test is required before starting this medication. A reliable form of contraception is recommended while taking this medication and for 5 months after the last dose. Talk to your care team about effective forms of contraception. Do not breast-feed while taking this medication and for 5 months after the last dose. What side effects may I notice from receiving this medication? Side effects that you should report to your care team as soon as possible: Allergic reactions--skin rash, itching, hives, swelling of the face, lips, tongue, or throat Dry cough, shortness of breath or trouble breathing Eye pain, redness, irritation, or discharge with blurry or decreased vision Heart muscle inflammation--unusual weakness or fatigue, shortness of breath, chest pain, fast or irregular heartbeat, dizziness, swelling of the ankles, feet, or hands Hormone gland problems--headache, sensitivity to light, unusual weakness or fatigue, dizziness, fast or irregular heartbeat, increased sensitivity to cold or heat, excessive sweating, constipation, hair loss, increased thirst or amount of urine, tremors or shaking, irritability Infusion reactions--chest pain, shortness of breath or trouble breathing, feeling faint or lightheaded Kidney injury (glomerulonephritis)--decrease in the amount of urine, red or dark brown urine, foamy or bubbly urine, swelling of the ankles, hands, or feet Liver injury--right upper belly pain, loss of appetite, nausea, light-colored  stool, dark yellow or brown urine, yellowing skin or eyes, unusual weakness or fatigue Pain, tingling, or numbness in the hands or feet, muscle weakness, change in vision, confusion or trouble speaking, loss of balance or coordination, trouble walking, seizures Rash, fever, and swollen lymph nodes Redness, blistering, peeling, or loosening of the skin, including inside the mouth Sudden or severe stomach pain, bloody diarrhea, fever, nausea, vomiting Side effects that usually do not require medical attention (report these to your care team if they continue or are bothersome): Bone, joint, or muscle pain Diarrhea Fatigue Loss of appetite Nausea Skin rash This list may not describe all possible side effects. Call your doctor for medical advice about side effects. You may report side effects to FDA at 1-800-FDA-1088. Where should I keep my medication? This medication is given in a hospital or clinic. It will not be stored at home. NOTE: This sheet is a summary. It may not cover all possible information. If you have questions about this medicine, talk to your doctor, pharmacist, or health care provider.  2024 [employer] Standard (2021-06-01 00:00:00)  

## 2023-07-27 ENCOUNTER — Telehealth: Payer: Self-pay

## 2023-07-27 NOTE — Telephone Encounter (Signed)
-----   Message from Alfonso Ike sent at 07/27/2023  8:07 AM EDT ----- Please let her know foot xray doesn't show any fracture. There is swelling, so might be strain or sprain. If swelling or pain goes up the leg, please let us  know. Thanks

## 2023-07-27 NOTE — Telephone Encounter (Signed)
 Patient notified and voiced understanding. Voiced a little soreness and bruised no new changes.

## 2023-07-28 ENCOUNTER — Inpatient Hospital Stay

## 2023-07-28 VITALS — BP 158/76 | HR 78 | Temp 98.0°F | Resp 18

## 2023-07-28 DIAGNOSIS — S9032XA Contusion of left foot, initial encounter: Secondary | ICD-10-CM | POA: Diagnosis not present

## 2023-07-28 DIAGNOSIS — E876 Hypokalemia: Secondary | ICD-10-CM

## 2023-07-28 DIAGNOSIS — Z7901 Long term (current) use of anticoagulants: Secondary | ICD-10-CM | POA: Diagnosis not present

## 2023-07-28 DIAGNOSIS — C169 Malignant neoplasm of stomach, unspecified: Secondary | ICD-10-CM | POA: Diagnosis not present

## 2023-07-28 DIAGNOSIS — R7989 Other specified abnormal findings of blood chemistry: Secondary | ICD-10-CM

## 2023-07-28 DIAGNOSIS — E860 Dehydration: Secondary | ICD-10-CM

## 2023-07-28 DIAGNOSIS — Z5111 Encounter for antineoplastic chemotherapy: Secondary | ICD-10-CM | POA: Diagnosis not present

## 2023-07-28 DIAGNOSIS — C168 Malignant neoplasm of overlapping sites of stomach: Secondary | ICD-10-CM

## 2023-07-28 DIAGNOSIS — D72829 Elevated white blood cell count, unspecified: Secondary | ICD-10-CM | POA: Diagnosis not present

## 2023-07-28 DIAGNOSIS — R197 Diarrhea, unspecified: Secondary | ICD-10-CM | POA: Diagnosis not present

## 2023-07-28 MED ORDER — SODIUM CHLORIDE 0.9 % IV SOLN: Freq: Once | INTRAVENOUS | Status: AC

## 2023-07-28 MED ORDER — SODIUM CHLORIDE 0.9% FLUSH
10.0000 mL | INTRAVENOUS | Status: DC | PRN
  Administered 2023-07-28: 10 mL

## 2023-07-28 MED ORDER — HEPARIN SOD (PORK) LOCK FLUSH 100 UNIT/ML IV SOLN
500.0000 [IU] | Freq: Once | INTRAVENOUS | Status: AC | PRN
  Administered 2023-07-28: 500 [IU]

## 2023-08-01 DIAGNOSIS — H40019 Open angle with borderline findings, low risk, unspecified eye: Secondary | ICD-10-CM | POA: Diagnosis not present

## 2023-08-02 ENCOUNTER — Inpatient Hospital Stay

## 2023-08-02 VITALS — BP 133/57 | HR 89 | Temp 98.7°F | Resp 18 | Ht 64.0 in

## 2023-08-02 DIAGNOSIS — E876 Hypokalemia: Secondary | ICD-10-CM

## 2023-08-02 DIAGNOSIS — D72829 Elevated white blood cell count, unspecified: Secondary | ICD-10-CM | POA: Diagnosis not present

## 2023-08-02 DIAGNOSIS — Z7901 Long term (current) use of anticoagulants: Secondary | ICD-10-CM | POA: Diagnosis not present

## 2023-08-02 DIAGNOSIS — S9032XA Contusion of left foot, initial encounter: Secondary | ICD-10-CM | POA: Diagnosis not present

## 2023-08-02 DIAGNOSIS — R7989 Other specified abnormal findings of blood chemistry: Secondary | ICD-10-CM

## 2023-08-02 DIAGNOSIS — E860 Dehydration: Secondary | ICD-10-CM

## 2023-08-02 DIAGNOSIS — C169 Malignant neoplasm of stomach, unspecified: Secondary | ICD-10-CM | POA: Diagnosis not present

## 2023-08-02 DIAGNOSIS — Z5111 Encounter for antineoplastic chemotherapy: Secondary | ICD-10-CM | POA: Diagnosis not present

## 2023-08-02 DIAGNOSIS — R197 Diarrhea, unspecified: Secondary | ICD-10-CM | POA: Diagnosis not present

## 2023-08-02 MED ORDER — HEPARIN SOD (PORK) LOCK FLUSH 100 UNIT/ML IV SOLN
500.0000 [IU] | Freq: Once | INTRAVENOUS | Status: AC | PRN
  Administered 2023-08-02: 500 [IU]

## 2023-08-02 MED ORDER — HEPARIN SOD (PORK) LOCK FLUSH 100 UNIT/ML IV SOLN
250.0000 [IU] | Freq: Once | INTRAVENOUS | Status: DC | PRN
Start: 2023-08-02 — End: 2023-08-02

## 2023-08-02 MED ORDER — SODIUM CHLORIDE 0.9 % IV SOLN: Freq: Once | INTRAVENOUS | Status: AC

## 2023-08-02 MED ORDER — SODIUM CHLORIDE 0.9% FLUSH
10.0000 mL | INTRAVENOUS | Status: DC | PRN
  Administered 2023-08-02: 10 mL via INTRAVENOUS

## 2023-08-02 MED ORDER — SODIUM CHLORIDE 0.9% FLUSH
3.0000 mL | Freq: Once | INTRAVENOUS | Status: DC | PRN
Start: 2023-08-02 — End: 2023-08-02

## 2023-08-02 NOTE — Progress Notes (Signed)
 0936 NO LABS NEEDED PER KELLI MOSHER, PAC.

## 2023-08-02 NOTE — Patient Instructions (Signed)
 Dehydration, Adult Dehydration is a condition in which there is not enough water or other fluids in the body. This happens when a person loses more fluids than they take in. Important organs cannot work right without the right amount of fluids. Any loss of fluids from the body can cause dehydration. Dehydration can be mild, worse, or very bad. It should be treated right away to keep it from getting very bad. What are the causes? Conditions that cause loss of water in the body. They include: Watery poop (diarrhea). Vomiting. Sweating a lot. Fever. Infection. Peeing (urinating) a lot. Not drinking enough fluids. Certain medicines, such as medicines that take extra fluid out of the body (diuretics). Lack of safe drinking water. Not being able to get enough water and food. What increases the risk? Having a long-term (chronic) illness that has not been treated the right way, such as: Diabetes. Heart disease. Kidney disease. Being 25 years of age or older. Having a disability. Living in a place that is high above the ground or sea (high in altitude). The thinner, drier air causes more fluid loss. Doing exercises that put stress on your body for a long time. Being active when in hot places. What are the signs or symptoms? Symptoms of dehydration depend on how bad it is. Mild or worse dehydration Thirst. Dry lips or dry mouth. Feeling dizzy or light-headed. Muscle cramps. Passing little pee or dark pee. Pee may be the color of tea. Headache. Very bad dehydration Changes in skin. Skin may: Be cold to the touch (clammy). Be blotchy or pale. Not go back to normal right after you pinch it and let it go. Little or no tears, pee, or sweat. Fast breathing. Low blood pressure. Weak pulse. Pulse that is more than 100 beats a minute when you are sitting still. Other changes, such as: Feeling very thirsty. Eyes that look hollow (sunken). Cold hands and feet. Being confused. Being very  tired (lethargic) or having trouble waking from sleep. Losing weight. Loss of consciousness. How is this treated? Treatment for this condition depends on how bad your dehydration is. Treatment should start right away. Do not wait until your condition gets very bad. Very bad dehydration is an emergency. You will need to go to a hospital. Mild or worse dehydration can be treated at home. You may be asked to: Drink more fluids. Drink an oral rehydration solution (ORS). This drink gives you the right amount of fluids, salts, and minerals (electrolytes). Very bad dehydration can be treated: With fluids through an IV tube. By correcting low levels of electrolytes in the body. By treating the problem that caused your dehydration. Follow these instructions at home: Oral rehydration solution If told by your doctor, drink an ORS: Make an ORS. Use instructions on the package. Start by drinking small amounts, about  cup (120 mL) every 5-10 minutes. Slowly drink more until you have had the amount that your doctor said to have.  Eating and drinking  Drink enough clear fluid to keep your pee pale yellow. If you were told to drink an ORS, finish the ORS first. Then, start slowly drinking other clear fluids. Drink fluids such as: Water. Do not drink only water. Doing that can make the salt (sodium) level in your body get too low. Water from ice chips you suck on. Fruit juice that you have added water to (diluted). Low-calorie sports drinks. Eat foods that have the right amounts of salts and minerals, such as bananas, oranges, potatoes,  tomatoes, or spinach. Do not drink alcohol. Avoid drinks that have caffeine or sugar. These include:: High-calorie sports drinks. Fruit juice that you did not add water to. Soda. Coffee or energy drinks. Avoid foods that are greasy or have a lot of fat or sugar. General instructions Take over-the-counter and prescription medicines only as told by your doctor. Do  not take sodium tablets. Doing that can make the salt level in your body get too high. Return to your normal activities as told by your doctor. Ask your doctor what activities are safe for you. Keep all follow-up visits. Your doctor may check and change your treatment. Contact a doctor if: You have pain in your belly (abdomen) and the pain: Gets worse. Stays in one place. You have a rash. You have a stiff neck. You get angry or annoyed more easily than normal. You are more tired or have a harder time waking than normal. You feel weak or dizzy. You feel very thirsty. Get help right away if: You have any symptoms of very bad dehydration. You vomit every time you eat or drink. Your vomiting gets worse, does not go away, or you vomit blood or green stuff. You are getting treatment, but symptoms are getting worse. You have a fever. You have a very bad headache. You have: Diarrhea that gets worse or does not go away. Blood in your poop (stool). This may cause poop to look black and tarry. No pee in 6-8 hours. Only a small amount of pee in 6-8 hours, and the pee is very dark. You have trouble breathing. These symptoms may be an emergency. Get help right away. Call 911. Do not wait to see if the symptoms will go away. Do not drive yourself to the hospital. This information is not intended to replace advice given to you by your health care provider. Make sure you discuss any questions you have with your health care provider. Document Revised: 08/31/2021 Document Reviewed: 08/31/2021 Elsevier Patient Education  2024 [employer].

## 2023-08-09 ENCOUNTER — Inpatient Hospital Stay

## 2023-08-09 VITALS — BP 147/70 | HR 95 | Temp 97.9°F | Resp 18

## 2023-08-09 DIAGNOSIS — D72829 Elevated white blood cell count, unspecified: Secondary | ICD-10-CM | POA: Diagnosis not present

## 2023-08-09 DIAGNOSIS — Z7901 Long term (current) use of anticoagulants: Secondary | ICD-10-CM | POA: Diagnosis not present

## 2023-08-09 DIAGNOSIS — E860 Dehydration: Secondary | ICD-10-CM

## 2023-08-09 DIAGNOSIS — R7989 Other specified abnormal findings of blood chemistry: Secondary | ICD-10-CM

## 2023-08-09 DIAGNOSIS — E876 Hypokalemia: Secondary | ICD-10-CM

## 2023-08-09 DIAGNOSIS — S9032XA Contusion of left foot, initial encounter: Secondary | ICD-10-CM | POA: Diagnosis not present

## 2023-08-09 DIAGNOSIS — Z5111 Encounter for antineoplastic chemotherapy: Secondary | ICD-10-CM | POA: Diagnosis not present

## 2023-08-09 DIAGNOSIS — Z95828 Presence of other vascular implants and grafts: Secondary | ICD-10-CM

## 2023-08-09 DIAGNOSIS — C169 Malignant neoplasm of stomach, unspecified: Secondary | ICD-10-CM | POA: Diagnosis not present

## 2023-08-09 DIAGNOSIS — R197 Diarrhea, unspecified: Secondary | ICD-10-CM | POA: Diagnosis not present

## 2023-08-09 MED ORDER — SODIUM CHLORIDE 0.9% FLUSH
10.0000 mL | Freq: Once | INTRAVENOUS | Status: AC
  Administered 2023-08-09: 10 mL via INTRAVENOUS

## 2023-08-09 MED ORDER — HEPARIN SOD (PORK) LOCK FLUSH 100 UNIT/ML IV SOLN
500.0000 [IU] | Freq: Once | INTRAVENOUS | Status: AC | PRN
Start: 2023-08-09 — End: 2023-08-09
  Administered 2023-08-09: 500 [IU]

## 2023-08-09 MED ORDER — SODIUM CHLORIDE 0.9 % IV SOLN: Freq: Once | INTRAVENOUS | Status: AC

## 2023-08-09 NOTE — Patient Instructions (Signed)

## 2023-08-10 ENCOUNTER — Inpatient Hospital Stay

## 2023-08-10 ENCOUNTER — Other Ambulatory Visit: Payer: Self-pay | Admitting: Hematology and Oncology

## 2023-08-10 ENCOUNTER — Inpatient Hospital Stay (HOSPITAL_BASED_OUTPATIENT_CLINIC_OR_DEPARTMENT_OTHER): Admitting: Hematology and Oncology

## 2023-08-10 ENCOUNTER — Encounter: Payer: Self-pay | Admitting: Hematology and Oncology

## 2023-08-10 ENCOUNTER — Telehealth: Payer: Self-pay

## 2023-08-10 VITALS — BP 123/78 | HR 110 | Temp 98.8°F | Resp 18 | Ht 64.0 in

## 2023-08-10 DIAGNOSIS — S9032XA Contusion of left foot, initial encounter: Secondary | ICD-10-CM | POA: Diagnosis not present

## 2023-08-10 DIAGNOSIS — E860 Dehydration: Secondary | ICD-10-CM

## 2023-08-10 DIAGNOSIS — C168 Malignant neoplasm of overlapping sites of stomach: Secondary | ICD-10-CM

## 2023-08-10 DIAGNOSIS — M79672 Pain in left foot: Secondary | ICD-10-CM

## 2023-08-10 DIAGNOSIS — E876 Hypokalemia: Secondary | ICD-10-CM

## 2023-08-10 DIAGNOSIS — R197 Diarrhea, unspecified: Secondary | ICD-10-CM | POA: Diagnosis not present

## 2023-08-10 DIAGNOSIS — D72829 Elevated white blood cell count, unspecified: Secondary | ICD-10-CM | POA: Diagnosis not present

## 2023-08-10 DIAGNOSIS — Z7901 Long term (current) use of anticoagulants: Secondary | ICD-10-CM | POA: Diagnosis not present

## 2023-08-10 DIAGNOSIS — Z5111 Encounter for antineoplastic chemotherapy: Secondary | ICD-10-CM | POA: Diagnosis not present

## 2023-08-10 DIAGNOSIS — R7989 Other specified abnormal findings of blood chemistry: Secondary | ICD-10-CM

## 2023-08-10 DIAGNOSIS — C169 Malignant neoplasm of stomach, unspecified: Secondary | ICD-10-CM | POA: Diagnosis not present

## 2023-08-10 LAB — CBC WITH DIFFERENTIAL (CANCER CENTER ONLY)
Abs Immature Granulocytes: 0.04 10*3/uL (ref 0.00–0.07)
Basophils Absolute: 0 10*3/uL (ref 0.0–0.1)
Basophils Relative: 0 %
Eosinophils Absolute: 0.2 10*3/uL (ref 0.0–0.5)
Eosinophils Relative: 1 %
HCT: 36.9 % (ref 36.0–46.0)
Hemoglobin: 12 g/dL (ref 12.0–15.0)
Immature Granulocytes: 0 %
Lymphocytes Relative: 15 %
Lymphs Abs: 2 10*3/uL (ref 0.7–4.0)
MCH: 27.7 pg (ref 26.0–34.0)
MCHC: 32.5 g/dL (ref 30.0–36.0)
MCV: 85.2 fL (ref 80.0–100.0)
Monocytes Absolute: 1.1 10*3/uL — ABNORMAL HIGH (ref 0.1–1.0)
Monocytes Relative: 8 %
Neutro Abs: 9.9 10*3/uL — ABNORMAL HIGH (ref 1.7–7.7)
Neutrophils Relative %: 76 %
Platelet Count: 190 10*3/uL (ref 150–400)
RBC: 4.33 MIL/uL (ref 3.87–5.11)
RDW: 15.1 % (ref 11.5–15.5)
WBC Count: 13.3 10*3/uL — ABNORMAL HIGH (ref 4.0–10.5)
nRBC: 0 % (ref 0.0–0.2)

## 2023-08-10 LAB — CMP (CANCER CENTER ONLY)
ALT: 17 U/L (ref 0–44)
AST: 17 U/L (ref 15–41)
Albumin: 4.2 g/dL (ref 3.5–5.0)
Alkaline Phosphatase: 100 U/L (ref 38–126)
Anion gap: 12 (ref 5–15)
BUN: 17 mg/dL (ref 8–23)
CO2: 21 mmol/L — ABNORMAL LOW (ref 22–32)
Calcium: 9 mg/dL (ref 8.9–10.3)
Chloride: 104 mmol/L (ref 98–111)
Creatinine: 1.33 mg/dL — ABNORMAL HIGH (ref 0.44–1.00)
GFR, Estimated: 39 mL/min — ABNORMAL LOW (ref >60)
Glucose, Bld: 124 mg/dL — ABNORMAL HIGH (ref 70–99)
Potassium: 4 mmol/L (ref 3.5–5.1)
Sodium: 137 mmol/L (ref 135–145)
Total Bilirubin: 0.5 mg/dL (ref 0.0–1.2)
Total Protein: 6.2 g/dL — ABNORMAL LOW (ref 6.5–8.1)

## 2023-08-10 LAB — C DIFFICILE QUICK SCREEN W PCR REFLEX
C Diff antigen: NEGATIVE
C Diff interpretation: NOT DETECTED
C Diff toxin: NEGATIVE

## 2023-08-10 MED ORDER — SODIUM CHLORIDE 0.9 % IV SOLN: Freq: Once | INTRAVENOUS | Status: AC

## 2023-08-10 MED ORDER — CIPROFLOXACIN HCL 500 MG PO TABS: 500.0000 mg | ORAL_TABLET | Freq: Two times a day (BID) | ORAL | 0 refills | Status: DC

## 2023-08-10 MED ORDER — HEPARIN SOD (PORK) LOCK FLUSH 100 UNIT/ML IV SOLN: 500.0000 [IU] | Freq: Once | INTRAVENOUS | Status: DC | PRN

## 2023-08-10 NOTE — Progress Notes (Signed)
 [facility]  [address] [place],  [place]  [address] [phone]  Clinic Day:  08/10/2023  Referring physician: Jefferey Fitch, MD  ASSESSMENT & PLAN:   Assessment & Plan: Diarrhea New onset diarrhea every 30 minutes since waking.  She reports chills and low-grade temperature 99-100 yesterday evening.  She has leukocytosis.  Stool studies are pending.  I will place her on ciprofloxacin  500 mg twice daily for presumed infectious diarrhea.  Although her creatinine has improved, I will give her IV fluids today.  We will have remainder of the results tomorrow and plan to contact her.  She has plans to go out of town Saturday for a week.  She is scheduled for IV fluids again on Friday and I will plan to repeat a CBC and BMP at that time.  Gastric cancer (HCC) Stage IVB (T4 N0 M1) poorly differentiated adenocarcinoma of the stomach with signet ring features and ulceration, metastatic to the omentum diagnosed in July 2023.  Stain for HER2 was negative.  PET scan revealed omental involvement. She received palliative chemotherapy with FOLFOX/nivolumab  (5-fluorouracil /leucovorin /oxaliplatin /nivolumab ). Oxaliplatin  was discontinued after 11 cycles, and she continues 5-fluorouracil /leucovorin /nivolumab .  PET in February 2024 revealed resolution of metabolic activity associated with the stomach.  Decrease in size of omental nodularity with no associated metabolic activity.   EGD in March revealed residual disease.  CT imaging every few months has remained stable. She has had chronic kidney disease since developing immune mediated nephritis in February 2024, which resolved with high-dose steroids and holding nivolumab .  She receives extra IV fluids weekly to prevent recurrent acute kidney injury.  She otherwise has tolerated treatment well.  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   Her disease has remained stable with maintenance fluorouracil /leucovorin /nivolumab .  Most recent CT chest, abdomen and pelvis in May revealed persistent nonspecific diffuse gastric wall thickening with persistent subtle peritoneal nodularity inferior to the gastric body and antrum. Stable small solid pulmonary nodules in the left lung base measuring up to 6 mm, favored benign etiology. Diffuse hepatic steatosis was seen. Following that, we changed her schedule from every 2 weeks to every 3 weeks.  She received 39th cycle of 5-fluorouracil /leucovorin /nivolumab  cycle 2 weeks ago.  She now has diarrhea, which is likely infectious in nature based on the low-grade temperature, chills and leukocytosis.  I will start her on ciprofloxacin  pending the stool studies.  She will keep her appointment on Friday for IV fluids, but I will repeat labs that day.  We will plan to see her back in 2 weeks with a CBC, comprehensive metabolic panel, and TSH prior to a 40th cycle of fluorouracil /leucovorin /nivolumab .  Left foot pain Persistent pain and tenderness of the left foot arch after twisting injury and fall 2 weeks ago.  Plain films today did not reveal any fracture. Bruising has resolved.  Shehas tenderness of the ankle and mild swelling today.  She has fractured her left ankle before.  She is on anticoagulation.  Will continue to monitor this.    The patient understands the plans discussed today and is in agreement with them.  She knows to contact our office if she develops concerns prior to her next appointment.   I provided 30 minutes of face-to-face time during this encounter and > 50% was spent counseling as documented under my assessment and plan.    [name],  PA-C  [facility] [place] - A DEPT OF [facility] [address] [place] [address] Dept: [phone] Dept Fax: [phone]   No orders of the defined types were  placed in this encounter.     CHIEF COMPLAINT:  CC: New onset diarrhea  Current Treatment: Fluorouracil /leucovorin /nivolumab   HISTORY OF PRESENT ILLNESS:   Oncology History  Gastric cancer (HCC)  09/10/2021 Initial Diagnosis   Gastric cancer (HCC)   09/10/2021 Cancer Staging   Staging form: Stomach, AJCC 8th Edition - Clinical stage from 09/10/2021: Stage IVB (cT4b, cN0, cM1) - Signed by Cornelius Wanda VEAR, MD on 09/10/2021 Histopathologic type: Adenocarcinoma, NOS Stage prefix: Initial diagnosis Total positive nodes: 0 Histologic grade (G): G3 Histologic grading system: 3 grade system Sites of metastasis: Peritoneal surface Diagnostic confirmation: Positive histology PLUS positive immunophenotyping and/or positive genetic studies Specimen type: Endoscopy with Biopsy Staged by: Managing physician Carcinoembryonic antigen (CEA) (ng/mL): 2.8 Carbohydrate antigen 19-9 (CA 19-9) (U/mL): 4.9 HER2 status: Unknown Microsatellite instability (MSI): Unknown Tumor location in stomach: Other Clinical staging modalities: Biopsy, Endoscopy Stage used in treatment planning: Yes National guidelines used in treatment planning: Yes Type of national guideline used in treatment planning: NCCN   09/28/2021 - 10/14/2021 Chemotherapy   Patient is on Treatment Plan : GASTROESOPHAGEAL FOLFOX + Nivolumab  q14d     09/28/2021 -  Chemotherapy   Patient is on Treatment Plan : GASTROESOPHAGEAL FOLFOX + Nivolumab  q21d (changed from q14d on 04/12/23)     12/09/2021 Genetic Testing   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  Report date is 12/09/2021.   The Multi-Cancer + RNA Panel offered by [facility] includes sequencing and/or deletion/duplication analysis of the following 84 genes:  AIP*, ALK, APC*, ATM*, AXIN2*, BAP1*, BARD1*, BLM*, BMPR1A*, BRCA1*, BRCA2*, BRIP1*, CASR, CDC73*, CDH1*, CDK4, CDKN1B*, CDKN1C*, CDKN2A, CEBPA, CHEK2*, CTNNA1*, DICER1*, DIS3L2*,  EGFR, EPCAM, FH*, FLCN*, GATA2*, GPC3, GREM1, HOXB13, HRAS, KIT, MAX*, MEN1*, MET, MITF, MLH1*, MSH2*, MSH3*, MSH6*, MUTYH*, NBN*, NF1*, NF2*, NTHL1*, PALB2*, PDGFRA, PHOX2B, PMS2*, POLD1*, POLE*, POT1*, PRKAR1A*, PTCH1*, PTEN*, RAD50*, RAD51C*, RAD51D*, RB1*, RECQL4, RET, RUNX1*, SDHA*, SDHAF2*, SDHB*, SDHC*, SDHD*, SMAD4*, SMARCA4*, SMARCB1*, SMARCE1*, STK11*, SUFU*, TERC, TERT, TMEM127*, Tp53*, TSC1*, TSC2*, VHL*, WRN*, and WT1.  RNA analysis is performed for * genes.       INTERVAL HISTORY:   Vora is added to the schedule today as she telephoned reporting diarrhea every 30 minutes since waking.  She reports chills and a low-grade temperature between 99 and 100 last night which resolved with Tylenol .  She has not had recurrent chills or fever.  She states she has been in bed most of the day.  She has not had much to drink or eat today.  She denies lightheadedness.  She denies eating something that may have made her sick. Her appetite is decreased.  She has persistent pain of the left foot after injury.  Plain films did not reveal any fracture.  REVIEW OF SYSTEMS:   Review of Systems  Constitutional:  Positive for appetite change, chills, fatigue and fever.  HENT:   Negative for sore throat.   Respiratory:  Negative for cough and shortness of breath.   Cardiovascular:  Negative for chest pain, leg swelling and palpitations.  Gastrointestinal:  Positive for diarrhea. Negative for abdominal pain, constipation, nausea and vomiting.  Genitourinary:  Negative for bladder incontinence, difficulty urinating, dysuria, frequency and hematuria.   Musculoskeletal:  Positive for arthralgias (left foot/ankle).  Skin:  Negative for rash.  Neurological:  Negative for dizziness and light-headedness.     VITALS:    08/10/2023  BASIC VITALS   BP 123/78   BP 114/48 !   Site Right Arm   Site Right Arm   Patient Position Sitting   Patient Position Sitting   Pulse Rate 110 !   Pulse Rate 36 (L)   Resp  18   Resp 18   Temp 98.8 F (37.1 C)   Height 5' 4   Weight   BMI (Calculated)   ORTHOSTATICS   Patient Position Sitting   Patient Position Sitting   OXIMETRY   SpO2 95 %   SpO2 95 %     Legend: ! Abnormal (L) Low  Wt Readings from Last 3 Encounters:  07/26/23 210 lb 8 oz (95.5 kg)  07/25/23 210 lb 6.4 oz (95.4 kg)  07/07/23 212 lb 1.3 oz (96.2 kg)    There is no height or weight on file to calculate BMI.  Performance status (ECOG): 3 - Symptomatic, >50% confined to bed    PHYSICAL EXAM:   Physical Exam Constitutional:      General: She is not in acute distress.    Appearance: Normal appearance. She is not ill-appearing.  HENT:     Mouth/Throat:     Mouth: Mucous membranes are dry.     Pharynx: Oropharynx is clear.   Cardiovascular:     Rate and Rhythm: Normal rate. Rhythm irregularly irregular. Frequent Extrasystoles are present.    Heart sounds: Normal heart sounds.     Comments: Pulse is 80 sitting. Pulse of 35 automated does not correlate with physical exam. EKG from [facility] on June 9 revealed sinus tachycardia with frequent and consecutive premature ventricular complexes. Abdominal:     General: There is no distension.     Palpations: Abdomen is soft.     Tenderness: There is no abdominal tenderness.   Musculoskeletal:     Right lower leg: No edema.     Left lower leg: Edema (trace pedal/ankle edema) present.   Neurological:     Mental Status: She is alert.    LABS:      Latest Ref Rng & Units 08/10/2023    3:27 PM 07/26/2023    8:42 AM 07/12/2023    9:23 AM  CBC  WBC 4.0 - 10.5 K/uL 13.3  6.6  5.3   Hemoglobin 12.0 - 15.0 g/dL 87.9  88.1  87.4   Hematocrit 36.0 - 46.0 % 36.9  36.4  37.3   Platelets 150 - 400 K/uL 190  209  181       Latest Ref Rng & Units 08/10/2023    3:27 PM 07/26/2023    8:42 AM 07/12/2023    9:23 AM  CMP  Glucose 70 - 99 mg/dL 875  831  806   BUN 8 - 23 mg/dL 17  22  22    Creatinine 0.44 - 1.00 mg/dL  8.66  8.50  8.65   Sodium 135 - 145 mmol/L 137  140  139   Potassium 3.5 - 5.1 mmol/L 4.0  3.9  3.6   Chloride 98 - 111 mmol/L 104  106  106   CO2 22 - 32 mmol/L 21  22  20    Calcium  8.9 - 10.3 mg/dL 9.0  9.6  9.3   Total Protein 6.5 - 8.1 g/dL 6.2  6.2  6.2   Total Bilirubin 0.0 - 1.2 mg/dL 0.5  0.4  0.4   Alkaline Phos 38 - 126 U/L 100  104  91   AST 15 - 41 U/L 17  22  17    ALT 0 - 44 U/L 17  21  18       Lab Results  Component Value Date   CEA1 2.9 09/10/2021   /  CEA  Date Value Ref Range Status  09/10/2021 2.9 0.0 - 4.7 ng/mL Final    Comment:    (NOTE)                             Nonsmokers          <3.9                             Smokers             <5.6 [employer] Electrochemiluminescence Immunoassay (ECLIA) Values obtained with different assay methods or kits cannot be used interchangeably.  Results cannot be interpreted as absolute evidence of the presence or absence of malignant disease. Performed At: [facility] [address] [place], [place] [id number] Jennette Shorter MD Ey:[phone]     STUDIES:   DG Foot Complete Left Result Date: 07/26/2023 CLINICAL DATA:  Left foot pain with ambulation after fall. Lateral aspect. Left foot pain on lateral aspect after falling yesterday. EXAM: LEFT FOOT - COMPLETE 3+ VIEW COMPARISON:  Left foot radiographs 04/27/2021 FINDINGS: Small plantar and posterior calcaneal heel spurs. Mild navicular-medial cuneiform joint space narrowing and subchondral sclerosis, similar to prior. No acute fracture or dislocation. Mild diffuse soft tissue swelling is increased from prior. IMPRESSION: 1. No acute fracture. 2. Small plantar calcaneal heel spur. 3. Mild diffuse soft tissue swelling, increased from prior. Electronically Signed   By: Tanda Lyons M.D.   On: 07/26/2023 21:00      HISTORY:   Past Medical History:  Diagnosis Date  . Abdominal pain 01/17/2023  . Appendicitis with peritonitis 04/10/2016  . Atypical  chest pain 09/09/2016  . Benign hypertensive renal disease 09/01/2016  . Bilateral primary osteoarthritis of knee 01/20/2016  . Borderline diabetes 09/09/2016  . CKD (chronic kidney disease), stage II 09/01/2016  . Cyclic citrullinated peptide (CCP) antibody positive 01/20/2016   Because she has positive CCP, I want to make sure we monitor the patient closely and we encouraged the patient to look for symptoms that include increased hand stiffness, swelling and redness to the MCP joint.  If that happens, she is to call us  so that we can schedule her for an ultrasound to look for synovitis.    . Essential hypertension 09/09/2016  . Gastric cancer (HCC) 09/10/2021  . Hyperlipidemia 09/01/2016  . Hypertension   . Hypothyroidism 09/01/2016  . Osteoarthritis of both feet 01/20/2016  . Osteoarthritis, hand 01/20/2016  . Thyroid  disease     Past Surgical History:  Procedure Laterality Date  . APPENDECTOMY    . LAPAROSCOPIC APPENDECTOMY N/A 04/10/2016   Procedure: APPENDECTOMY LAPAROSCOPIC;  Surgeon: Vicenta Poli, MD;  Location: [facility] OR;  Service: General;  Laterality: N/A;    Family History  Problem Relation Age of Onset  . Hypertension Mother   . Prostate cancer Father        metastatic; d. 56  . Brain cancer Sister 44  . Breast cancer Sister 66  . AAA (abdominal aortic aneurysm) Brother   . Leukemia Cousin  x2 maternal female cousins; d. before 68  . Breast cancer Daughter 75       DCIS    Social History:  reports that she has never smoked. She has never used smokeless tobacco. She reports that she does not currently use alcohol . She reports that she does not use drugs.The patient is accompanied by her grandson, Eva, today.  Allergies:  Allergies  Allergen Reactions  . Doxycycline Rash  . Sulfa Antibiotics Other (See Comments) and Rash    Other reaction(s): Other (See Comments)  Made me feel weird    Current Medications: Current Outpatient Medications   Medication Sig Dispense Refill  . apixaban  (ELIQUIS ) 5 MG TABS tablet Take 1 tablet (5 mg total) by mouth 2 (two) times daily. 180 tablet 1  . b complex vitamins capsule Take 1 capsule by mouth daily.    . Calcium  Carbonate (CALCIUM  600 PO) Take 1 tablet by mouth daily.    . Cholecalciferol (VITAMIN D3) 5000 units CAPS Take 1 capsule by mouth daily.    . ciprofloxacin  (CIPRO ) 500 MG tablet Take 1 tablet (500 mg total) by mouth 2 (two) times daily. 14 tablet 0  . famotidine (PEPCID) 40 MG tablet Take 40 mg by mouth at bedtime.    . hydrochlorothiazide (HYDRODIURIL) 25 MG tablet Take 25 mg by mouth daily.    SABRA KRILL OIL PO Take 1 capsule by mouth daily. Unknown strenght    . Lactobacillus TABS Take 1 tablet by mouth 2 (two) times daily.    . levothyroxine  (SYNTHROID , LEVOTHROID) 75 MCG tablet Take 75 mcg by mouth daily before breakfast.     . LORazepam  (ATIVAN ) 0.5 MG tablet Take 1 tablet (0.5 mg total) by mouth every 8 (eight) hours. 30 tablet 0  . NON FORMULARY Take 1 Dose by mouth See admin instructions. MMW: 3 parts Maalox 2 parts Benadryl  1 part viscious lidicaine  Disp. 6oz  Instructions: 5ml swish and swallow every 3-4 hours    . omeprazole  (PRILOSEC) 40 MG capsule Take 1 capsule (40 mg total) by mouth 2 (two) times daily. 60 capsule 5  . ondansetron  (ZOFRAN -ODT) 4 MG disintegrating tablet Take 1 tablet (4 mg total) by mouth every 8 (eight) hours as needed for nausea or vomiting. 90 tablet 0  . polyethylene glycol powder (GLYCOLAX /MIRALAX ) 17 GM/SCOOP powder Take 1 Container by mouth daily.    . potassium chloride  SA (KLOR-CON  M) 20 MEQ tablet TAKE 1 TABLET TWICE A DAY 180 tablet 3  . pravastatin  (PRAVACHOL ) 20 MG tablet Take 1 tablet (20 mg total) by mouth every evening. 90 tablet 1  . Probiotic Product (PROBIOTIC DAILY PO) Take 1 tablet by mouth daily.    . prochlorperazine  (COMPAZINE ) 10 MG tablet Take 1 tablet (10 mg total) by mouth every 6 (six) hours as needed for nausea or  vomiting. 90 tablet 3  . valsartan (DIOVAN) 160 MG tablet Take 160 mg by mouth daily.     No current facility-administered medications for this visit.   Facility-Administered Medications Ordered in Other Visits  Medication Dose Route Frequency Provider Last Rate Last Admin  . heparin  lock flush 100 unit/mL  500 Units Intracatheter Once PRN Cornelius Wanda DEL, MD

## 2023-08-10 NOTE — Assessment & Plan Note (Signed)
 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  Stain for HER2 was negative.  PET scan revealed omental involvement. She received palliative chemotherapy with FOLFOX/nivolumab  (5-fluorouracil /leucovorin /oxaliplatin /nivolumab ). Oxaliplatin  was discontinued after 11 cycles, and she continues 5-fluorouracil /leucovorin /nivolumab .  PET in February 2024 revealed resolution of metabolic activity associated with the stomach.  Decrease in size of omental nodularity with no associated metabolic activity.   EGD in March revealed residual disease.  CT imaging every few months has remained stable. She has had chronic kidney disease since developing immune mediated nephritis in February 2024, which resolved with high-dose steroids and holding nivolumab .  She receives extra IV fluids weekly to prevent recurrent acute kidney injury.  She otherwise has tolerated treatment well.  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   Her disease has remained stable with maintenance fluorouracil /leucovorin /nivolumab .  Most recent CT chest, abdomen and pelvis in May revealed persistent nonspecific diffuse gastric wall thickening with persistent subtle peritoneal nodularity inferior to the gastric body and antrum. Stable small solid pulmonary nodules in the left lung base measuring up to 6 mm, favored benign etiology. Diffuse hepatic steatosis was seen. Following that, we changed her schedule from every 2 weeks to every 3 weeks.  She received 39th cycle of 5-fluorouracil /leucovorin /nivolumab  cycle 2 weeks ago.  She now has diarrhea, which is likely infectious in nature based on the low-grade temperature, chills and leukocytosis.  I will start her on ciprofloxacin  pending the stool studies.  She will  keep her appointment on Friday for IV fluids, but I will repeat labs that day.  We will plan to see her back in 2 weeks with a CBC, comprehensive metabolic panel, and TSH prior to a 40th cycle of fluorouracil /leucovorin /nivolumab .

## 2023-08-10 NOTE — Assessment & Plan Note (Signed)
 Persistent pain and tenderness of the left foot arch after twisting injury and fall 2 weeks ago.  Plain films today did not reveal any fracture. Bruising has resolved.  Shehas tenderness of the ankle and mild swelling today.  She has fractured her left ankle before.  She is on anticoagulation.  Will continue to monitor this.

## 2023-08-10 NOTE — Telephone Encounter (Signed)
 Pt calling to ask if she needs to come in? She is having diarrhea every 30 minutes, low grade fever T99.6 (normal temp 97-98 per pt), and chills. Pt hasn't taken any Imodium at all- acted like she never heard of it/been told about it. States, I've always had to deal with constipation.  Pt is contacting someone to bring her in asap for labs, and possible IVF per Mosher,PA. I told her how to take the Imodium and she wants to write down, but wanted to go ahead and call for ride. Infusion nurses notified to please give her another copy of the Imodium instruction sheet.

## 2023-08-10 NOTE — Assessment & Plan Note (Signed)
 New onset diarrhea every 30 minutes since waking.  She reports chills and low-grade temperature 99-100 yesterday evening.  She has leukocytosis.  Stool studies are pending.  I will place her on ciprofloxacin  500 mg twice daily for presumed infectious diarrhea.  Although her creatinine has improved, I will give her IV fluids today.  We will have remainder of the results tomorrow and plan to contact her.  She has plans to go out of town Saturday for a week.  She is scheduled for IV fluids again on Friday and I will plan to repeat a CBC and BMP at that time.

## 2023-08-10 NOTE — Patient Instructions (Signed)
 Dehydration, Adult Dehydration is a condition in which there is not enough water or other fluids in the body. This happens when a person loses more fluids than they take in. Important organs cannot work right without the right amount of fluids. Any loss of fluids from the body can cause dehydration. Dehydration can be mild, worse, or very bad. It should be treated right away to keep it from getting very bad. What are the causes? Conditions that cause loss of water in the body. They include: Watery poop (diarrhea). Vomiting. Sweating a lot. Fever. Infection. Peeing (urinating) a lot. Not drinking enough fluids. Certain medicines, such as medicines that take extra fluid out of the body (diuretics). Lack of safe drinking water. Not being able to get enough water and food. What increases the risk? Having a long-term (chronic) illness that has not been treated the right way, such as: Diabetes. Heart disease. Kidney disease. Being 25 years of age or older. Having a disability. Living in a place that is high above the ground or sea (high in altitude). The thinner, drier air causes more fluid loss. Doing exercises that put stress on your body for a long time. Being active when in hot places. What are the signs or symptoms? Symptoms of dehydration depend on how bad it is. Mild or worse dehydration Thirst. Dry lips or dry mouth. Feeling dizzy or light-headed. Muscle cramps. Passing little pee or dark pee. Pee may be the color of tea. Headache. Very bad dehydration Changes in skin. Skin may: Be cold to the touch (clammy). Be blotchy or pale. Not go back to normal right after you pinch it and let it go. Little or no tears, pee, or sweat. Fast breathing. Low blood pressure. Weak pulse. Pulse that is more than 100 beats a minute when you are sitting still. Other changes, such as: Feeling very thirsty. Eyes that look hollow (sunken). Cold hands and feet. Being confused. Being very  tired (lethargic) or having trouble waking from sleep. Losing weight. Loss of consciousness. How is this treated? Treatment for this condition depends on how bad your dehydration is. Treatment should start right away. Do not wait until your condition gets very bad. Very bad dehydration is an emergency. You will need to go to a hospital. Mild or worse dehydration can be treated at home. You may be asked to: Drink more fluids. Drink an oral rehydration solution (ORS). This drink gives you the right amount of fluids, salts, and minerals (electrolytes). Very bad dehydration can be treated: With fluids through an IV tube. By correcting low levels of electrolytes in the body. By treating the problem that caused your dehydration. Follow these instructions at home: Oral rehydration solution If told by your doctor, drink an ORS: Make an ORS. Use instructions on the package. Start by drinking small amounts, about  cup (120 mL) every 5-10 minutes. Slowly drink more until you have had the amount that your doctor said to have.  Eating and drinking  Drink enough clear fluid to keep your pee pale yellow. If you were told to drink an ORS, finish the ORS first. Then, start slowly drinking other clear fluids. Drink fluids such as: Water. Do not drink only water. Doing that can make the salt (sodium) level in your body get too low. Water from ice chips you suck on. Fruit juice that you have added water to (diluted). Low-calorie sports drinks. Eat foods that have the right amounts of salts and minerals, such as bananas, oranges, potatoes,  tomatoes, or spinach. Do not drink alcohol. Avoid drinks that have caffeine or sugar. These include:: High-calorie sports drinks. Fruit juice that you did not add water to. Soda. Coffee or energy drinks. Avoid foods that are greasy or have a lot of fat or sugar. General instructions Take over-the-counter and prescription medicines only as told by your doctor. Do  not take sodium tablets. Doing that can make the salt level in your body get too high. Return to your normal activities as told by your doctor. Ask your doctor what activities are safe for you. Keep all follow-up visits. Your doctor may check and change your treatment. Contact a doctor if: You have pain in your belly (abdomen) and the pain: Gets worse. Stays in one place. You have a rash. You have a stiff neck. You get angry or annoyed more easily than normal. You are more tired or have a harder time waking than normal. You feel weak or dizzy. You feel very thirsty. Get help right away if: You have any symptoms of very bad dehydration. You vomit every time you eat or drink. Your vomiting gets worse, does not go away, or you vomit blood or green stuff. You are getting treatment, but symptoms are getting worse. You have a fever. You have a very bad headache. You have: Diarrhea that gets worse or does not go away. Blood in your poop (stool). This may cause poop to look black and tarry. No pee in 6-8 hours. Only a small amount of pee in 6-8 hours, and the pee is very dark. You have trouble breathing. These symptoms may be an emergency. Get help right away. Call 911. Do not wait to see if the symptoms will go away. Do not drive yourself to the hospital. This information is not intended to replace advice given to you by your health care provider. Make sure you discuss any questions you have with your health care provider. Document Revised: 08/31/2021 Document Reviewed: 08/31/2021 Elsevier Patient Education  2024 [employer].

## 2023-08-11 ENCOUNTER — Ambulatory Visit

## 2023-08-11 ENCOUNTER — Telehealth: Payer: Self-pay

## 2023-08-11 LAB — GASTROINTESTINAL PANEL BY PCR, STOOL (REPLACES STOOL CULTURE)

## 2023-08-11 NOTE — Telephone Encounter (Signed)
 Chiquita, lab employee @ [facility] called to make sure we were aware that the pt's GI panel came back with +Campylobacter

## 2023-08-12 ENCOUNTER — Encounter: Payer: Self-pay | Admitting: Oncology

## 2023-08-12 ENCOUNTER — Inpatient Hospital Stay

## 2023-08-12 VITALS — BP 145/67 | HR 94 | Temp 98.6°F | Resp 18

## 2023-08-12 DIAGNOSIS — D72829 Elevated white blood cell count, unspecified: Secondary | ICD-10-CM | POA: Diagnosis not present

## 2023-08-12 DIAGNOSIS — R7989 Other specified abnormal findings of blood chemistry: Secondary | ICD-10-CM

## 2023-08-12 DIAGNOSIS — E876 Hypokalemia: Secondary | ICD-10-CM

## 2023-08-12 DIAGNOSIS — Z7901 Long term (current) use of anticoagulants: Secondary | ICD-10-CM | POA: Diagnosis not present

## 2023-08-12 DIAGNOSIS — Z5111 Encounter for antineoplastic chemotherapy: Secondary | ICD-10-CM | POA: Diagnosis not present

## 2023-08-12 DIAGNOSIS — Z95828 Presence of other vascular implants and grafts: Secondary | ICD-10-CM

## 2023-08-12 DIAGNOSIS — R197 Diarrhea, unspecified: Secondary | ICD-10-CM

## 2023-08-12 DIAGNOSIS — E860 Dehydration: Secondary | ICD-10-CM

## 2023-08-12 DIAGNOSIS — C169 Malignant neoplasm of stomach, unspecified: Secondary | ICD-10-CM | POA: Diagnosis not present

## 2023-08-12 DIAGNOSIS — S9032XA Contusion of left foot, initial encounter: Secondary | ICD-10-CM | POA: Diagnosis not present

## 2023-08-12 LAB — BASIC METABOLIC PANEL - CANCER CENTER ONLY
Anion gap: 12 (ref 5–15)
BUN: 16 mg/dL (ref 8–23)
CO2: 20 mmol/L — ABNORMAL LOW (ref 22–32)
Calcium: 9.1 mg/dL (ref 8.9–10.3)
Chloride: 105 mmol/L (ref 98–111)
Creatinine: 1.45 mg/dL — ABNORMAL HIGH (ref 0.44–1.00)
GFR, Estimated: 35 mL/min — ABNORMAL LOW (ref >60)
Glucose, Bld: 132 mg/dL — ABNORMAL HIGH (ref 70–99)
Potassium: 3.6 mmol/L (ref 3.5–5.1)
Sodium: 138 mmol/L (ref 135–145)

## 2023-08-12 LAB — CBC WITH DIFFERENTIAL (CANCER CENTER ONLY)
Abs Immature Granulocytes: 0.02 10*3/uL (ref 0.00–0.07)
Basophils Absolute: 0 10*3/uL (ref 0.0–0.1)
Basophils Relative: 1 %
Eosinophils Absolute: 0.2 10*3/uL (ref 0.0–0.5)
Eosinophils Relative: 3 %
HCT: 35.8 % — ABNORMAL LOW (ref 36.0–46.0)
Hemoglobin: 11.9 g/dL — ABNORMAL LOW (ref 12.0–15.0)
Immature Granulocytes: 0 %
Lymphocytes Relative: 31 %
Lymphs Abs: 2.2 10*3/uL (ref 0.7–4.0)
MCH: 28.1 pg (ref 26.0–34.0)
MCHC: 33.2 g/dL (ref 30.0–36.0)
MCV: 84.6 fL (ref 80.0–100.0)
Monocytes Absolute: 0.9 10*3/uL (ref 0.1–1.0)
Monocytes Relative: 12 %
Neutro Abs: 3.9 10*3/uL (ref 1.7–7.7)
Neutrophils Relative %: 53 %
Platelet Count: 202 10*3/uL (ref 150–400)
RBC: 4.23 MIL/uL (ref 3.87–5.11)
RDW: 15.2 % (ref 11.5–15.5)
WBC Count: 7.4 10*3/uL (ref 4.0–10.5)
nRBC: 0 % (ref 0.0–0.2)

## 2023-08-12 MED ORDER — HEPARIN SOD (PORK) LOCK FLUSH 100 UNIT/ML IV SOLN
500.0000 [IU] | Freq: Once | INTRAVENOUS | Status: AC | PRN
  Administered 2023-08-12: 500 [IU]

## 2023-08-12 MED ORDER — SODIUM CHLORIDE 0.9 % IV SOLN: Freq: Once | INTRAVENOUS | Status: AC

## 2023-08-12 MED ORDER — SODIUM CHLORIDE FLUSH 0.9 % IV SOLN
10.0000 mL | Freq: Once | INTRAVENOUS | Status: AC
  Administered 2023-08-12: 10 mL via INTRAVENOUS

## 2023-08-12 NOTE — Patient Instructions (Signed)

## 2023-08-22 NOTE — Progress Notes (Signed)
 [facility]  [address] [place],  [place]  [address] [phone]  Clinic Day:  08/23/2023  Referring physician: Jefferey Fitch, MD  ASSESSMENT & PLAN:   Assessment & Plan: Gastric cancer [facility]) Stage IVB (T4 N0 M1) poorly differentiated adenocarcinoma of the stomach with signet ring features and ulceration, metastatic to the omentum diagnosed in July 2023.  Stain for HER2 was negative.  PET scan revealed omental involvement. She received palliative chemotherapy with FOLFOX/nivolumab  (5-fluorouracil /leucovorin /oxaliplatin /nivolumab ). Oxaliplatin  was discontinued after 11 cycles, and she continues 5-fluorouracil /leucovorin /nivolumab .  PET in February 2024 revealed resolution of metabolic activity associated with the stomach.  Decrease in size of omental nodularity with no associated metabolic activity.   EGD in March revealed residual disease.  CT imaging every few months has remained stable. She has had chronic kidney disease since developing immune mediated nephritis in February 2024, which resolved with high-dose steroids and holding nivolumab .  She receives extra IV fluids weekly to prevent recurrent acute kidney injury.  She otherwise has tolerated treatment well.  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   Her disease has remained stable with maintenance fluorouracil /leucovorin /nivolumab .  Most recent CT chest, abdomen and pelvis in May revealed persistent nonspecific diffuse gastric wall thickening with persistent subtle peritoneal nodularity inferior to the gastric body and antrum. Stable small solid pulmonary nodules in the left lung base measuring up to 6 mm, favored benign etiology. Diffuse hepatic steatosis was seen. Following that, we changed her schedule from every 2 weeks to every 3 weeks.  She was found to have Campylobacter in the  stool after her 39th cycle of 5-fluorouracil /leucovorin /nivolumab  cycle.  She was treated with ciprofloxacin  with resolution of her diarrhea.  She has had persistent epigastric pain, so I recommended she see Dr. Larene for consideration of repeat EGD.  She states Dr. Cornelius was going to send her last time she saw her. I will refer again.  I advised her to take her famotidine at lunch, as she is taking omeprazole  morning and night.  She will get additional IV fluids today as she is clinically dehydrated. She will proceed with a 40th cycle of fluorouracil /leucovorin /nivolumab  today.   We will continue to give her IV fluids weekly.  We will plan to see her back in 4 weeks with a CBC, comprehensive metabolic panel, and TSH prior to a 41st cycle of fluorouracil /leucovorin /nivolumab .  Drug-induced interstitial nephritis History immune mediated nephritis from immunotherapy in February, which resolved with prednisone .  She has had chronic kidney disease since that episode.  She will continue to get weekly fluids.  She will skip the week that she is on vacation.  She knows to push clear fluids.    Acute deep vein thrombosis (DVT) of popliteal vein of right lower extremity (HCC) Occlusive deep venous thrombosis along the popliteal and peroneal vein diagnosed in March 2025. She was placed on apixaban  10 mg twice daily for 7 days, then apixaban  5 mg twice daily indefinitely.  Aspirin 81 mg daily was discontinued.  The patient states she continues apixaban  5 mg twice daily without difficulty.  Left leg swelling Worsening left leg swelling since fall in June. Although she is on anticoagulation, I will obtain and venous ultrasound today to evaluate for deep venous thrombosis.  Acute left ankle pain She previously reported left foot pain and x-ray was negative.  She reports ankle pain, with increased  left leg swelling.  I will obtain plain x-ray of her left ankle today.    The patient understands the plans  discussed today and is in agreement with them.  She knows to contact our office if she develops concerns prior to her next appointment.   I provided 30 minutes of face-to-face time during this encounter and > 50% was spent counseling as documented under my assessment and plan.    Mackenzie DELENA Foy, PA-C  [facility] [place] [facility] DEPT OF [facility] [address] [place] [address] Dept: [phone] Dept Fax: [phone]   Orders Placed This Encounter  Procedures   DG Ankle 2 Views Left    Standing Status:   Future    Expected Date:   08/23/2023    Expiration Date:   08/22/2024    Reason for Exam (SYMPTOM  OR DIAGNOSIS REQUIRED):   ankle pain    Preferred imaging location?:   [facility]   US  Venous Img Lower Unilateral Left (DVT)    Standing Status:   Future    Expected Date:   08/23/2023    Expiration Date:   08/22/2024    Reason for Exam (SYMPTOM  OR DIAGNOSIS REQUIRED):   increased swelling of left leg on apixaban     Preferred imaging location?:   [facility]      CHIEF COMPLAINT:  CC: Stage IVB gastric cancer  Current Treatment: 5-fluorouracil /leucovorin /nivolumab   HISTORY OF PRESENT ILLNESS:   Oncology History  Gastric cancer (HCC)  09/10/2021 Initial Diagnosis   Gastric cancer (HCC)   09/10/2021 Cancer Staging   Staging form: Stomach, AJCC 8th Edition - Clinical stage from 09/10/2021: Stage IVB (cT4b, cN0, cM1) - Signed by Mackenzie Wanda DEL, MD on 09/10/2021 Histopathologic type: Adenocarcinoma, NOS Stage prefix: Initial diagnosis Total positive nodes: 0 Histologic grade (G): G3 Histologic grading system: 3 grade system Sites of metastasis: Peritoneal surface Diagnostic confirmation: Positive histology PLUS positive immunophenotyping and/or positive genetic studies Specimen type: Endoscopy with Biopsy Staged by: Managing physician Carcinoembryonic antigen (CEA) (ng/mL): 2.8 Carbohydrate antigen 19-9 (CA  19-9) (U/mL): 4.9 HER2 status: Unknown Microsatellite instability (MSI): Unknown Tumor location in stomach: Other Clinical staging modalities: Biopsy, Endoscopy Stage used in treatment planning: Yes National guidelines used in treatment planning: Yes Type of national guideline used in treatment planning: NCCN   09/28/2021 - 10/14/2021 Chemotherapy   Patient is on Treatment Plan : GASTROESOPHAGEAL FOLFOX + Nivolumab  q14d     09/28/2021 -  Chemotherapy   Patient is on Treatment Plan : GASTROESOPHAGEAL FOLFOX + Nivolumab  q21d (changed from q14d on 04/12/23)     12/09/2021 Genetic Testing   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  Report date is 12/09/2021.   The Multi-Cancer + RNA Panel offered by [facility] includes sequencing and/or deletion/duplication analysis of the following 84 genes:  AIP*, ALK, APC*, ATM*, AXIN2*, BAP1*, BARD1*, BLM*, BMPR1A*, BRCA1*, BRCA2*, BRIP1*, CASR, CDC73*, CDH1*, CDK4, CDKN1B*, CDKN1C*, CDKN2A, CEBPA, CHEK2*, CTNNA1*, DICER1*, DIS3L2*, EGFR, EPCAM, FH*, FLCN*, GATA2*, GPC3, GREM1, HOXB13, HRAS, KIT, MAX*, MEN1*, MET, MITF, MLH1*, MSH2*, MSH3*, MSH6*, MUTYH*, NBN*, NF1*, NF2*, NTHL1*, PALB2*, PDGFRA, PHOX2B, PMS2*, POLD1*, POLE*, POT1*, PRKAR1A*, PTCH1*, PTEN*, RAD50*, RAD51C*, RAD51D*, RB1*, RECQL4, RET, RUNX1*, SDHA*, SDHAF2*, SDHB*, SDHC*, SDHD*, SMAD4*, SMARCA4*, SMARCB1*, SMARCE1*, STK11*, SUFU*, TERC, TERT, TMEM127*, Tp53*, TSC1*, TSC2*, VHL*, WRN*, and WT1.  RNA analysis is performed for * genes.       INTERVAL HISTORY:   Mackenzie Lewis  is here today for repeat clinical assessment prior to 40th cycle of 5-fluorouracil /leucovorin /nivolumab  which she is now receiving every 3 weeks.  It has been 4 weeks since her last treatment as she was on vacation.  On June 25, she was here for weekly IV fluids and reported severe diarrhea.  She was found to have Campylobacter in the stool and was treated with ciprofloxacin .  She denies  diarrhea, and states she has several small stools a day.  She reports more frequent epigastric pain. She is taking omeprazole  twice daily and famotidine at night with the omeprazole . I instructed her to take the famotidine at lunch.  She denies fevers or chills. She denies pain. Her appetite is decreased. Her weight has decreased 4 pounds over last 4 weeks. She had a fairly good vacation., but did not feel well enough to fish or crab.  REVIEW OF SYSTEMS:   Review of Systems  Constitutional:  Positive for appetite change (decreased). Negative for chills, diaphoresis, fatigue, fever and unexpected weight change.  HENT:   Negative for lump/mass, mouth sores and sore throat.   Respiratory:  Negative for cough, hemoptysis and shortness of breath.   Cardiovascular:  Negative for chest pain and leg swelling.  Gastrointestinal:  Positive for nausea (intermittent, mild). Negative for abdominal pain, blood in stool, constipation, diarrhea and vomiting.  Endocrine: Negative for hot flashes.  Genitourinary:  Negative for difficulty urinating, dysuria, frequency, hematuria and vaginal bleeding.   Musculoskeletal:  Positive for arthralgias (left shoulder) and gait problem (due to left foot and ankle pain). Negative for back pain, myalgias and neck pain.  Skin:  Negative for rash.  Neurological:  Positive for gait problem (due to left foot and ankle pain) and numbness (neuropathy feet). Negative for dizziness, extremity weakness, headaches and light-headedness.  Hematological:  Negative for adenopathy. Does not bruise/bleed easily.  Psychiatric/Behavioral:  Negative for depression and sleep disturbance. The patient is not nervous/anxious.      VITALS:   Blood pressure 132/63, pulse 95, temperature 97.9 F (36.6 C), temperature source Oral, resp. rate 16, height 5' 4 (1.626 m), weight 206 lb 11.2 oz (93.8 kg), SpO2 98%.  Wt Readings from Last 3 Encounters:  08/23/23 206 lb 11.2 oz (93.8 kg)  07/26/23 210  lb 8 oz (95.5 kg)  07/25/23 210 lb 6.4 oz (95.4 kg)    Body mass index is 35.48 kg/m.  Performance status (ECOG): 1 - Symptomatic but completely ambulatory    PHYSICAL EXAM:   Physical Exam Vitals and nursing note reviewed.  Constitutional:      General: She is not in acute distress.    Appearance: Normal appearance.  HENT:     Head: Normocephalic and atraumatic.     Mouth/Throat:     Mouth: Mucous membranes are moist.     Pharynx: Oropharynx is clear. No oropharyngeal exudate or posterior oropharyngeal erythema.  Eyes:     General: No scleral icterus.    Extraocular Movements: Extraocular movements intact.     Conjunctiva/sclera: Conjunctivae normal.     Pupils: Pupils are equal, round, and reactive to light.  Cardiovascular:     Rate and Rhythm: Normal rate and regular rhythm.     Heart sounds: Normal heart sounds. No murmur heard.    No friction rub. No gallop.  Pulmonary:     Effort: Pulmonary effort is normal.     Breath sounds: Normal breath sounds. No wheezing, rhonchi or rales.  Abdominal:     General: Bowel sounds are  normal. There is no distension.     Palpations: Abdomen is soft. There is no hepatomegaly, splenomegaly or mass.     Tenderness: There is abdominal tenderness in the epigastric area.  Musculoskeletal:        General: Normal range of motion.     Cervical back: Normal range of motion and neck supple. No tenderness.     Right lower leg: No edema.     Left lower leg: Edema present.     Left ankle: Swelling present. Tenderness present.  Lymphadenopathy:     Cervical: No cervical adenopathy.     Upper Body:     Right upper body: No supraclavicular or axillary adenopathy.     Left upper body: No supraclavicular or axillary adenopathy.     Lower Body: No right inguinal adenopathy. No left inguinal adenopathy.  Skin:    General: Skin is warm and dry.     Coloration: Skin is not jaundiced.     Findings: No rash.  Neurological:     Mental Status: She  is alert and oriented to person, place, and time.     Cranial Nerves: No cranial nerve deficit.  Psychiatric:        Mood and Affect: Mood normal.        Behavior: Behavior normal.        Thought Content: Thought content normal.     LABS:      Latest Ref Rng & Units 08/23/2023    8:34 AM 08/12/2023   12:56 PM 08/10/2023    3:27 PM  CBC  WBC 4.0 - 10.5 K/uL 9.4  7.4  13.3   Hemoglobin 12.0 - 15.0 g/dL 88.1  88.0  87.9   Hematocrit 36.0 - 46.0 % 36.2  35.8  36.9   Platelets 150 - 400 K/uL 185  202  190       Latest Ref Rng & Units 08/23/2023    8:34 AM 08/12/2023   12:56 PM 08/10/2023    3:27 PM  CMP  Glucose 70 - 99 mg/dL 829  867  875   BUN 8 - 23 mg/dL 24  16  17    Creatinine 0.44 - 1.00 mg/dL 8.71  8.54  8.66   Sodium 135 - 145 mmol/L 138  138  137   Potassium 3.5 - 5.1 mmol/L 4.2  3.6  4.0   Chloride 98 - 111 mmol/L 105  105  104   CO2 22 - 32 mmol/L 22  20  21    Calcium  8.9 - 10.3 mg/dL 9.2  9.1  9.0   Total Protein 6.5 - 8.1 g/dL 6.2   6.2   Total Bilirubin 0.0 - 1.2 mg/dL 0.5   0.5   Alkaline Phos 38 - 126 U/L 95   100   AST 15 - 41 U/L 24   17   ALT 0 - 44 U/L 19   17      Lab Results  Component Value Date   CEA1 2.9 09/10/2021   /  CEA  Date Value Ref Range Status  09/10/2021 2.9 0.0 - 4.7 ng/mL Final    Comment:    (NOTE)                             Nonsmokers          <3.9  Smokers             <5.6 [employer] Electrochemiluminescence Immunoassay (ECLIA) Values obtained with different assay methods or kits cannot be used interchangeably.  Results cannot be interpreted as absolute evidence of the presence or absence of malignant disease. Performed At: [facility] [address] [place], [place] [id number] Jennette Shorter MD Ey:[phone]     STUDIES:   DG Foot Complete Left Result Date: 07/26/2023 CLINICAL DATA:  Left foot pain with ambulation after fall. Lateral aspect. Left foot pain on lateral aspect  after falling yesterday. EXAM: LEFT FOOT - COMPLETE 3+ VIEW COMPARISON:  Left foot radiographs 04/27/2021 FINDINGS: Small plantar and posterior calcaneal heel spurs. Mild navicular-medial cuneiform joint space narrowing and subchondral sclerosis, similar to prior. No acute fracture or dislocation. Mild diffuse soft tissue swelling is increased from prior. IMPRESSION: 1. No acute fracture. 2. Small plantar calcaneal heel spur. 3. Mild diffuse soft tissue swelling, increased from prior. Electronically Signed   By: Tanda Lyons M.D.   On: 07/26/2023 21:00      HISTORY:   Past Medical History:  Diagnosis Date   Abdominal pain 01/17/2023   Appendicitis with peritonitis 04/10/2016   Atypical chest pain 09/09/2016   Benign hypertensive renal disease 09/01/2016   Bilateral primary osteoarthritis of knee 01/20/2016   Borderline diabetes 09/09/2016   CKD (chronic kidney disease), stage II 09/01/2016   Cyclic citrullinated peptide (CCP) antibody positive 01/20/2016   Because she has positive CCP, I want to make sure we monitor the patient closely and we encouraged the patient to look for symptoms that include increased hand stiffness, swelling and redness to the MCP joint.  If that happens, she is to call us  so that we can schedule her for an ultrasound to look for synovitis.     Essential hypertension 09/09/2016   Gastric cancer (HCC) 09/10/2021   Hyperlipidemia 09/01/2016   Hypertension    Hypothyroidism 09/01/2016   Osteoarthritis of both feet 01/20/2016   Osteoarthritis, hand 01/20/2016   Thyroid  disease     Past Surgical History:  Procedure Laterality Date   APPENDECTOMY     LAPAROSCOPIC APPENDECTOMY N/A 04/10/2016   Procedure: APPENDECTOMY LAPAROSCOPIC;  Surgeon: Vicenta Poli, MD;  Location: [facility] OR;  Service: General;  Laterality: N/A;    Family History  Problem Relation Age of Onset   Hypertension Mother    Prostate cancer Father        metastatic; d. 58   Brain cancer Sister  76   Breast cancer Sister 19   AAA (abdominal aortic aneurysm) Brother    Leukemia Cousin        x2 maternal female cousins; d. before 38   Breast cancer Daughter 32       DCIS    Social History:  reports that she has never smoked. She has never used smokeless tobacco. She reports that she does not currently use alcohol . She reports that she does not use drugs.The patient is alone today.  Allergies:  Allergies  Allergen Reactions   Doxycycline Rash   Sulfa Antibiotics Other (See Comments) and Rash    Other reaction(s): Other (See Comments)  Made me feel weird    Current Medications: Current Outpatient Medications  Medication Sig Dispense Refill   apixaban  (ELIQUIS ) 5 MG TABS tablet Take 1 tablet (5 mg total) by mouth 2 (two) times daily. 180 tablet 1   b complex vitamins capsule Take 1 capsule by mouth daily.     Calcium  Carbonate (  CALCIUM  600 PO) Take 1 tablet by mouth daily.     Cholecalciferol (VITAMIN D3) 5000 units CAPS Take 1 capsule by mouth daily.     ciprofloxacin  (CIPRO ) 500 MG tablet Take 1 tablet (500 mg total) by mouth 2 (two) times daily. 14 tablet 0   famotidine (PEPCID) 40 MG tablet Take 40 mg by mouth at bedtime.     hydrochlorothiazide (HYDRODIURIL) 25 MG tablet Take 25 mg by mouth daily.     KRILL OIL PO Take 1 capsule by mouth daily. Unknown strenght     Lactobacillus TABS Take 1 tablet by mouth 2 (two) times daily.     levothyroxine  (SYNTHROID , LEVOTHROID) 75 MCG tablet Take 75 mcg by mouth daily before breakfast.      LORazepam  (ATIVAN ) 0.5 MG tablet Take 1 tablet (0.5 mg total) by mouth every 8 (eight) hours. 30 tablet 0   NON FORMULARY Take 1 Dose by mouth See admin instructions. MMW: 3 parts Maalox 2 parts Benadryl  1 part viscious lidicaine  Disp. 6oz  Instructions: 5ml swish and swallow every 3-4 hours     omeprazole  (PRILOSEC) 40 MG capsule Take 1 capsule (40 mg total) by mouth 2 (two) times daily. 60 capsule 5   ondansetron  (ZOFRAN -ODT) 4 MG  disintegrating tablet Take 1 tablet (4 mg total) by mouth every 8 (eight) hours as needed for nausea or vomiting. 90 tablet 0   polyethylene glycol powder (GLYCOLAX /MIRALAX ) 17 GM/SCOOP powder Take 1 Container by mouth daily.     potassium chloride  SA (KLOR-CON  M) 20 MEQ tablet TAKE 1 TABLET TWICE A DAY 180 tablet 3   pravastatin  (PRAVACHOL ) 20 MG tablet Take 1 tablet (20 mg total) by mouth every evening. 90 tablet 1   Probiotic Product (PROBIOTIC DAILY PO) Take 1 tablet by mouth daily.     prochlorperazine  (COMPAZINE ) 10 MG tablet Take 1 tablet (10 mg total) by mouth every 6 (six) hours as needed for nausea or vomiting. 90 tablet 3   valsartan (DIOVAN) 160 MG tablet Take 160 mg by mouth daily.     No current facility-administered medications for this visit.   Facility-Administered Medications Ordered in Other Visits  Medication Dose Route Frequency Provider Last Rate Last Admin   0.9 %  sodium chloride  infusion   Intravenous Once Mackenzie Wanda DEL, MD 500 mL/hr at 08/23/23 1005 New Bag at 08/23/23 1005   0.9 %  sodium chloride  infusion   Intravenous Once Mackenzie Wanda DEL, MD       dexamethasone  (DECADRON ) injection 10 mg  10 mg Intravenous Once Mackenzie Wanda DEL, MD       fluorouracil  (ADRUCIL ) 3,850 mg in sodium chloride  0.9 % 73 mL chemo infusion  1,960 mg/m2 (Order-Specific) Intravenous 1 day or 1 dose Mackenzie Wanda DEL, MD       fluorouracil  (ADRUCIL ) chemo injection 650 mg  320 mg/m2 (Order-Specific) Intravenous Once Mackenzie Wanda DEL, MD       leucovorin  630 mg in dextrose  5 % 250 mL infusion  320 mg/m2 (Order-Specific) Intravenous Once Mackenzie Wanda DEL, MD       nivolumab  (OPDIVO ) 240 mg in sodium chloride  0.9 % 100 mL chemo infusion  240 mg Intravenous Once Mackenzie Wanda DEL, MD       palonosetron  (ALOXI ) injection 0.25 mg  0.25 mg Intravenous Once Mackenzie Wanda DEL, MD

## 2023-08-22 NOTE — Assessment & Plan Note (Signed)
 Occlusive deep venous thrombosis along the popliteal and peroneal vein diagnosed in March 2025. She was placed on apixaban  10 mg twice daily for 7 days, then apixaban  5 mg twice daily indefinitely.  Aspirin 81 mg daily was discontinued.  The patient states she continues apixaban  5 mg twice daily without difficulty.

## 2023-08-22 NOTE — Assessment & Plan Note (Signed)
 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  Stain for HER2 was negative.  PET scan revealed omental involvement. She received palliative chemotherapy with FOLFOX/nivolumab  (5-fluorouracil /leucovorin /oxaliplatin /nivolumab ). Oxaliplatin  was discontinued after 11 cycles, and she continues 5-fluorouracil /leucovorin /nivolumab .  PET in February 2024 revealed resolution of metabolic activity associated with the stomach.  Decrease in size of omental nodularity with no associated metabolic activity.   EGD in March revealed residual disease.  CT imaging every few months has remained stable. She has had chronic kidney disease since developing immune mediated nephritis in February 2024, which resolved with high-dose steroids and holding nivolumab .  She receives extra IV fluids weekly to prevent recurrent acute kidney injury.  She otherwise has tolerated treatment well.  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   Her disease has remained stable with maintenance fluorouracil /leucovorin /nivolumab .  Most recent CT chest, abdomen and pelvis in May revealed persistent nonspecific diffuse gastric wall thickening with persistent subtle peritoneal nodularity inferior to the gastric body and antrum. Stable small solid pulmonary nodules in the left lung base measuring up to 6 mm, favored benign etiology. Diffuse hepatic steatosis was seen. Following that, we changed her schedule from every 2 weeks to every 3 weeks.  She was found to have Campylobacter in the stool after her 39th cycle of 5-fluorouracil /leucovorin /nivolumab  cycle.  She was treated with ciprofloxacin  with resolution of her symptoms.  She will proceed with a 40th cycle today.  We will continue to give her IV fluids  weekly.  We will plan to see her back in 4 weeks with a CBC, comprehensive metabolic panel, and TSH prior to a 41st cycle of fluorouracil /leucovorin /nivolumab .

## 2023-08-22 NOTE — Assessment & Plan Note (Signed)
 History immune mediated nephritis from immunotherapy in February, which resolved with prednisone .  She has had chronic kidney disease since that episode.  She will continue to get weekly fluids.  She will skip the week that she is on vacation.  She knows to push clear fluids.

## 2023-08-23 ENCOUNTER — Encounter: Payer: Self-pay | Admitting: Oncology

## 2023-08-23 ENCOUNTER — Telehealth: Payer: Self-pay | Admitting: Oncology

## 2023-08-23 ENCOUNTER — Inpatient Hospital Stay: Attending: Hematology and Oncology

## 2023-08-23 ENCOUNTER — Inpatient Hospital Stay (HOSPITAL_BASED_OUTPATIENT_CLINIC_OR_DEPARTMENT_OTHER): Admitting: Hematology and Oncology

## 2023-08-23 ENCOUNTER — Telehealth: Payer: Self-pay | Admitting: Hematology and Oncology

## 2023-08-23 ENCOUNTER — Inpatient Hospital Stay

## 2023-08-23 ENCOUNTER — Encounter: Payer: Self-pay | Admitting: Hematology and Oncology

## 2023-08-23 VITALS — BP 132/63 | HR 95 | Temp 97.9°F | Resp 16 | Ht 64.0 in | Wt 206.7 lb

## 2023-08-23 DIAGNOSIS — Z79899 Other long term (current) drug therapy: Secondary | ICD-10-CM | POA: Insufficient documentation

## 2023-08-23 DIAGNOSIS — E860 Dehydration: Secondary | ICD-10-CM | POA: Insufficient documentation

## 2023-08-23 DIAGNOSIS — I82431 Acute embolism and thrombosis of right popliteal vein: Secondary | ICD-10-CM

## 2023-08-23 DIAGNOSIS — K760 Fatty (change of) liver, not elsewhere classified: Secondary | ICD-10-CM | POA: Diagnosis not present

## 2023-08-23 DIAGNOSIS — Z86718 Personal history of other venous thrombosis and embolism: Secondary | ICD-10-CM | POA: Diagnosis not present

## 2023-08-23 DIAGNOSIS — C168 Malignant neoplasm of overlapping sites of stomach: Secondary | ICD-10-CM | POA: Diagnosis not present

## 2023-08-23 DIAGNOSIS — N182 Chronic kidney disease, stage 2 (mild): Secondary | ICD-10-CM | POA: Diagnosis not present

## 2023-08-23 DIAGNOSIS — Z7901 Long term (current) use of anticoagulants: Secondary | ICD-10-CM | POA: Diagnosis not present

## 2023-08-23 DIAGNOSIS — N142 Nephropathy induced by unspecified drug, medicament or biological substance: Secondary | ICD-10-CM | POA: Diagnosis not present

## 2023-08-23 DIAGNOSIS — E785 Hyperlipidemia, unspecified: Secondary | ICD-10-CM | POA: Insufficient documentation

## 2023-08-23 DIAGNOSIS — R7989 Other specified abnormal findings of blood chemistry: Secondary | ICD-10-CM

## 2023-08-23 DIAGNOSIS — M7989 Other specified soft tissue disorders: Secondary | ICD-10-CM | POA: Diagnosis not present

## 2023-08-23 DIAGNOSIS — C169 Malignant neoplasm of stomach, unspecified: Secondary | ICD-10-CM | POA: Diagnosis not present

## 2023-08-23 DIAGNOSIS — Z5111 Encounter for antineoplastic chemotherapy: Secondary | ICD-10-CM | POA: Insufficient documentation

## 2023-08-23 DIAGNOSIS — I129 Hypertensive chronic kidney disease with stage 1 through stage 4 chronic kidney disease, or unspecified chronic kidney disease: Secondary | ICD-10-CM | POA: Diagnosis not present

## 2023-08-23 DIAGNOSIS — M25572 Pain in left ankle and joints of left foot: Secondary | ICD-10-CM | POA: Insufficient documentation

## 2023-08-23 DIAGNOSIS — E039 Hypothyroidism, unspecified: Secondary | ICD-10-CM | POA: Diagnosis not present

## 2023-08-23 DIAGNOSIS — E876 Hypokalemia: Secondary | ICD-10-CM

## 2023-08-23 LAB — TSH: TSH: 2.919 u[IU]/mL (ref 0.350–4.500)

## 2023-08-23 LAB — CMP (CANCER CENTER ONLY)
ALT: 19 U/L (ref 0–44)
AST: 24 U/L (ref 15–41)
Albumin: 3.8 g/dL (ref 3.5–5.0)
Alkaline Phosphatase: 95 U/L (ref 38–126)
Anion gap: 11 (ref 5–15)
BUN: 24 mg/dL — ABNORMAL HIGH (ref 8–23)
CO2: 22 mmol/L (ref 22–32)
Calcium: 9.2 mg/dL (ref 8.9–10.3)
Chloride: 105 mmol/L (ref 98–111)
Creatinine: 1.28 mg/dL — ABNORMAL HIGH (ref 0.44–1.00)
GFR, Estimated: 41 mL/min — ABNORMAL LOW (ref >60)
Glucose, Bld: 170 mg/dL — ABNORMAL HIGH (ref 70–99)
Potassium: 4.2 mmol/L (ref 3.5–5.1)
Sodium: 138 mmol/L (ref 135–145)
Total Bilirubin: 0.5 mg/dL (ref 0.0–1.2)
Total Protein: 6.2 g/dL — ABNORMAL LOW (ref 6.5–8.1)

## 2023-08-23 LAB — CBC WITH DIFFERENTIAL (CANCER CENTER ONLY)
Abs Immature Granulocytes: 0.03 K/uL (ref 0.00–0.07)
Basophils Absolute: 0.1 K/uL (ref 0.0–0.1)
Basophils Relative: 1 %
Eosinophils Absolute: 0.3 K/uL (ref 0.0–0.5)
Eosinophils Relative: 4 %
HCT: 36.2 % (ref 36.0–46.0)
Hemoglobin: 11.8 g/dL — ABNORMAL LOW (ref 12.0–15.0)
Immature Granulocytes: 0 %
Lymphocytes Relative: 22 %
Lymphs Abs: 2.1 K/uL (ref 0.7–4.0)
MCH: 27.6 pg (ref 26.0–34.0)
MCHC: 32.6 g/dL (ref 30.0–36.0)
MCV: 84.8 fL (ref 80.0–100.0)
Monocytes Absolute: 0.7 K/uL (ref 0.1–1.0)
Monocytes Relative: 7 %
Neutro Abs: 6.2 K/uL (ref 1.7–7.7)
Neutrophils Relative %: 66 %
Platelet Count: 185 K/uL (ref 150–400)
RBC: 4.27 MIL/uL (ref 3.87–5.11)
RDW: 14.9 % (ref 11.5–15.5)
WBC Count: 9.4 K/uL (ref 4.0–10.5)
nRBC: 0 % (ref 0.0–0.2)

## 2023-08-23 MED ORDER — SODIUM CHLORIDE 0.9 % IV SOLN
240.0000 mg | Freq: Once | INTRAVENOUS | Status: AC
  Administered 2023-08-23: 240 mg via INTRAVENOUS
  Filled 2023-08-23: qty 24

## 2023-08-23 MED ORDER — DEXAMETHASONE SODIUM PHOSPHATE 10 MG/ML IJ SOLN
10.0000 mg | Freq: Once | INTRAMUSCULAR | Status: AC
  Administered 2023-08-23: 10 mg via INTRAVENOUS
  Filled 2023-08-23: qty 1

## 2023-08-23 MED ORDER — SODIUM CHLORIDE 0.9 % IV SOLN
1960.0000 mg/m2 | INTRAVENOUS | Status: DC
  Administered 2023-08-23: 3850 mg via INTRAVENOUS
  Filled 2023-08-23: qty 77

## 2023-08-23 MED ORDER — LEUCOVORIN CALCIUM INJECTION 350 MG
320.0000 mg/m2 | Freq: Once | INTRAVENOUS | Status: AC
  Administered 2023-08-23: 630 mg via INTRAVENOUS
  Filled 2023-08-23: qty 31.5

## 2023-08-23 MED ORDER — SODIUM CHLORIDE 0.9 % IV SOLN: Freq: Once | INTRAVENOUS | Status: AC

## 2023-08-23 MED ORDER — FLUOROURACIL CHEMO INJECTION 2.5 GM/50ML
320.0000 mg/m2 | Freq: Once | INTRAVENOUS | Status: AC
  Administered 2023-08-23: 650 mg via INTRAVENOUS
  Filled 2023-08-23: qty 13

## 2023-08-23 MED ORDER — PALONOSETRON HCL INJECTION 0.25 MG/5ML
0.2500 mg | Freq: Once | INTRAVENOUS | Status: AC
  Administered 2023-08-23: 0.25 mg via INTRAVENOUS
  Filled 2023-08-23: qty 5

## 2023-08-23 NOTE — Telephone Encounter (Signed)
 Patient has been scheduled for follow-up visit per 08/23/23 LOS.  Pt given an appt calendar with date and time.

## 2023-08-23 NOTE — Patient Instructions (Signed)
Fluorouracil Injection What is this medication? FLUOROURACIL (flure oh YOOR a sil) treats some types of cancer. It works by slowing down the growth of cancer cells. This medicine may be used for other purposes; ask your health care provider or pharmacist if you have questions. COMMON BRAND NAME(S): Adrucil What should I tell my care team before I take this medication? They need to know if you have any of these conditions: Blood disorders Dihydropyrimidine dehydrogenase (DPD) deficiency Infection, such as chickenpox, cold sores, herpes Kidney disease Liver disease Poor nutrition Recent or ongoing radiation therapy An unusual or allergic reaction to fluorouracil, other medications, foods, dyes, or preservatives If you or your partner are pregnant or trying to get pregnant Breast-feeding How should I use this medication? This medication is injected into a vein. It is administered by your care team in a hospital or clinic setting. Talk to your care team about the use of this medication in children. Special care may be needed. Overdosage: If you think you have taken too much of this medicine contact a poison control center or emergency room at once. NOTE: This medicine is only for you. Do not share this medicine with others. What if I miss a dose? Keep appointments for follow-up doses. It is important not to miss your dose. Call your care team if you are unable to keep an appointment. What may interact with this medication? Do not take this medication with any of the following: Live virus vaccines This medication may also interact with the following: Medications that treat or prevent blood clots, such as warfarin, enoxaparin, dalteparin This list may not describe all possible interactions. Give your health care provider a list of all the medicines, herbs, non-prescription drugs, or dietary supplements you use. Also tell them if you smoke, drink alcohol, or use illegal drugs. Some items may  interact with your medicine. What should I watch for while using this medication? Your condition will be monitored carefully while you are receiving this medication. This medication may make you feel generally unwell. This is not uncommon as chemotherapy can affect healthy cells as well as cancer cells. Report any side effects. Continue your course of treatment even though you feel ill unless your care team tells you to stop. In some cases, you may be given additional medications to help with side effects. Follow all directions for their use. This medication may increase your risk of getting an infection. Call your care team for advice if you get a fever, chills, sore throat, or other symptoms of a cold or flu. Do not treat yourself. Try to avoid being around people who are sick. This medication may increase your risk to bruise or bleed. Call your care team if you notice any unusual bleeding. Be careful brushing or flossing your teeth or using a toothpick because you may get an infection or bleed more easily. If you have any dental work done, tell your dentist you are receiving this medication. Avoid taking medications that contain aspirin, acetaminophen, ibuprofen, naproxen, or ketoprofen unless instructed by your care team. These medications may hide a fever. Do not treat diarrhea with over the counter products. Contact your care team if you have diarrhea that lasts more than 2 days or if it is severe and watery. This medication can make you more sensitive to the sun. Keep out of the sun. If you cannot avoid being in the sun, wear protective clothing and sunscreen. Do not use sun lamps, tanning beds, or tanning booths. Talk to   your care team if you or your partner wish to become pregnant or think you might be pregnant. This medication can cause serious birth defects if taken during pregnancy and for 3 months after the last dose. A reliable form of contraception is recommended while taking this  medication and for 3 months after the last dose. Talk to your care team about effective forms of contraception. Do not father a child while taking this medication and for 3 months after the last dose. Use a condom while having sex during this time period. Do not breastfeed while taking this medication. This medication may cause infertility. Talk to your care team if you are concerned about your fertility. What side effects may I notice from receiving this medication? Side effects that you should report to your care team as soon as possible: Allergic reactions--skin rash, itching, hives, swelling of the face, lips, tongue, or throat Heart attack--pain or tightness in the chest, shoulders, arms, or jaw, nausea, shortness of breath, cold or clammy skin, feeling faint or lightheaded Heart failure--shortness of breath, swelling of the ankles, feet, or hands, sudden weight gain, unusual weakness or fatigue Heart rhythm changes--fast or irregular heartbeat, dizziness, feeling faint or lightheaded, chest pain, trouble breathing High ammonia level--unusual weakness or fatigue, confusion, loss of appetite, nausea, vomiting, seizures Infection--fever, chills, cough, sore throat, wounds that don't heal, pain or trouble when passing urine, general feeling of discomfort or being unwell Low red blood cell level--unusual weakness or fatigue, dizziness, headache, trouble breathing Pain, tingling, or numbness in the hands or feet, muscle weakness, change in vision, confusion or trouble speaking, loss of balance or coordination, trouble walking, seizures Redness, swelling, and blistering of the skin over hands and feet Severe or prolonged diarrhea Unusual bruising or bleeding Side effects that usually do not require medical attention (report to your care team if they continue or are bothersome): Dry skin Headache Increased tears Nausea Pain, redness, or swelling with sores inside the mouth or throat Sensitivity  to light Vomiting This list may not describe all possible side effects. Call your doctor for medical advice about side effects. You may report side effects to FDA at 1-800-FDA-1088. Where should I keep my medication? This medication is given in a hospital or clinic. It will not be stored at home. NOTE: This sheet is a summary. It may not cover all possible information. If you have questions about this medicine, talk to your doctor, pharmacist, or health care provider.  2024 [employer] Standard (2021-06-09 00:00:00) Leucovorin Injection What is this medication? LEUCOVORIN (loo koe VOR in) prevents side effects from certain medications, such as methotrexate. It works by increasing folate levels. This helps protect healthy cells in your body. It may also be used to treat anemia caused by low levels of folate. It can also be used with fluorouracil, a type of chemotherapy, to treat colorectal cancer. It works by increasing the effects of fluorouracil in the body. This medicine may be used for other purposes; ask your health care provider or pharmacist if you have questions. What should I tell my care team before I take this medication? They need to know if you have any of these conditions: Anemia from low levels of vitamin B12 in the blood An unusual or allergic reaction to leucovorin, folic acid, other medications, foods, dyes, or preservatives Pregnant or trying to get pregnant Breastfeeding How should I use this medication? This medication is injected into a vein or a muscle. It is given by your care team   in a hospital or clinic setting. Talk to your care team about the use of this medication in children. Special care may be needed. Overdosage: If you think you have taken too much of this medicine contact a poison control center or emergency room at once. NOTE: This medicine is only for you. Do not share this medicine with others. What if I miss a dose? Keep appointments for follow-up doses.  It is important not to miss your dose. Call your care team if you are unable to keep an appointment. What may interact with this medication? Capecitabine Fluorouracil Phenobarbital Phenytoin Primidone Trimethoprim;sulfamethoxazole This list may not describe all possible interactions. Give your health care provider a list of all the medicines, herbs, non-prescription drugs, or dietary supplements you use. Also tell them if you smoke, drink alcohol, or use illegal drugs. Some items may interact with your medicine. What should I watch for while using this medication? Your condition will be monitored carefully while you are receiving this medication. This medication may increase the side effects of 5-fluorouracil. Tell your care team if you have diarrhea or mouth sores that do not get better or that get worse. What side effects may I notice from receiving this medication? Side effects that you should report to your care team as soon as possible: Allergic reactions--skin rash, itching, hives, swelling of the face, lips, tongue, or throat This list may not describe all possible side effects. Call your doctor for medical advice about side effects. You may report side effects to FDA at 1-800-FDA-1088. Where should I keep my medication? This medication is given in a hospital or clinic. It will not be stored at home. NOTE: This sheet is a summary. It may not cover all possible information. If you have questions about this medicine, talk to your doctor, pharmacist, or health care provider.  2024 [employer] Standard (2021-07-07 00:00:00) Nivolumab Injection What is this medication? NIVOLUMAB (nye VOL ue mab) treats some types of cancer. It works by helping your immune system slow or stop the spread of cancer cells. It is a monoclonal antibody. This medicine may be used for other purposes; ask your health care provider or pharmacist if you have questions. COMMON BRAND NAME(S): Opdivo What should I  tell my care team before I take this medication? They need to know if you have any of these conditions: Allogeneic stem cell transplant (uses someone else's stem cells) Autoimmune diseases, such as Crohn disease, ulcerative colitis, lupus History of chest radiation Nervous system problems, such as Guillain-Barre syndrome or myasthenia gravis Organ transplant An unusual or allergic reaction to nivolumab, other medications, foods, dyes, or preservatives Pregnant or trying to get pregnant Breast-feeding How should I use this medication? This medication is infused into a vein. It is given in a hospital or clinic setting. A special MedGuide will be given to you before each treatment. Be sure to read this information carefully each time. Talk to your care team about the use of this medication in children. While it may be prescribed for children as young as 12 years for selected conditions, precautions do apply. Overdosage: If you think you have taken too much of this medicine contact a poison control center or emergency room at once. NOTE: This medicine is only for you. Do not share this medicine with others. What if I miss a dose? Keep appointments for follow-up doses. It is important not to miss your dose. Call your care team if you are unable to keep an appointment. What may   interact with this medication? Interactions have not been studied. This list may not describe all possible interactions. Give your health care provider a list of all the medicines, herbs, non-prescription drugs, or dietary supplements you use. Also tell them if you smoke, drink alcohol, or use illegal drugs. Some items may interact with your medicine. What should I watch for while using this medication? Your condition will be monitored carefully while you are receiving this medication. You may need blood work while taking this medication. This medication may cause serious skin reactions. They can happen weeks to months after  starting the medication. Contact your care team right away if you notice fevers or flu-like symptoms with a rash. The rash may be red or purple and then turn into blisters or peeling of the skin. You may also notice a red rash with swelling of the face, lips, or lymph nodes in your neck or under your arms. Tell your care team right away if you have any change in your eyesight. Talk to your care team if you are pregnant or think you might be pregnant. A negative pregnancy test is required before starting this medication. A reliable form of contraception is recommended while taking this medication and for 5 months after the last dose. Talk to your care team about effective forms of contraception. Do not breast-feed while taking this medication and for 5 months after the last dose. What side effects may I notice from receiving this medication? Side effects that you should report to your care team as soon as possible: Allergic reactions--skin rash, itching, hives, swelling of the face, lips, tongue, or throat Dry cough, shortness of breath or trouble breathing Eye pain, redness, irritation, or discharge with blurry or decreased vision Heart muscle inflammation--unusual weakness or fatigue, shortness of breath, chest pain, fast or irregular heartbeat, dizziness, swelling of the ankles, feet, or hands Hormone gland problems--headache, sensitivity to light, unusual weakness or fatigue, dizziness, fast or irregular heartbeat, increased sensitivity to cold or heat, excessive sweating, constipation, hair loss, increased thirst or amount of urine, tremors or shaking, irritability Infusion reactions--chest pain, shortness of breath or trouble breathing, feeling faint or lightheaded Kidney injury (glomerulonephritis)--decrease in the amount of urine, red or dark brown urine, foamy or bubbly urine, swelling of the ankles, hands, or feet Liver injury--right upper belly pain, loss of appetite, nausea, light-colored  stool, dark yellow or brown urine, yellowing skin or eyes, unusual weakness or fatigue Pain, tingling, or numbness in the hands or feet, muscle weakness, change in vision, confusion or trouble speaking, loss of balance or coordination, trouble walking, seizures Rash, fever, and swollen lymph nodes Redness, blistering, peeling, or loosening of the skin, including inside the mouth Sudden or severe stomach pain, bloody diarrhea, fever, nausea, vomiting Side effects that usually do not require medical attention (report these to your care team if they continue or are bothersome): Bone, joint, or muscle pain Diarrhea Fatigue Loss of appetite Nausea Skin rash This list may not describe all possible side effects. Call your doctor for medical advice about side effects. You may report side effects to FDA at 1-800-FDA-1088. Where should I keep my medication? This medication is given in a hospital or clinic. It will not be stored at home. NOTE: This sheet is a summary. It may not cover all possible information. If you have questions about this medicine, talk to your doctor, pharmacist, or health care provider.  2024 [employer] Standard (2021-06-01 00:00:00)  

## 2023-08-23 NOTE — Assessment & Plan Note (Signed)
 Worsening left leg swelling since fall in June. Although she is on anticoagulation, I will obtain and venous ultrasound today to evaluate for deep venous thrombosis.

## 2023-08-23 NOTE — Addendum Note (Signed)
 Addended by: Sereen Schaff M on: 08/23/2023 03:37 PM   Modules accepted: Orders

## 2023-08-23 NOTE — Assessment & Plan Note (Signed)
 She previously reported left foot pain and x-ray was negative.  She reports ankle pain, with increased left leg swelling.  I will obtain plain x-ray of her left ankle today.

## 2023-08-24 ENCOUNTER — Other Ambulatory Visit: Payer: Self-pay

## 2023-08-24 LAB — T4: T4, Total: 8.9 ug/dL (ref 4.5–12.0)

## 2023-08-25 ENCOUNTER — Inpatient Hospital Stay

## 2023-08-25 VITALS — BP 136/61 | HR 84 | Temp 98.1°F | Resp 18

## 2023-08-25 DIAGNOSIS — C168 Malignant neoplasm of overlapping sites of stomach: Secondary | ICD-10-CM

## 2023-08-25 DIAGNOSIS — E785 Hyperlipidemia, unspecified: Secondary | ICD-10-CM | POA: Diagnosis not present

## 2023-08-25 DIAGNOSIS — C169 Malignant neoplasm of stomach, unspecified: Secondary | ICD-10-CM | POA: Diagnosis not present

## 2023-08-25 DIAGNOSIS — E039 Hypothyroidism, unspecified: Secondary | ICD-10-CM | POA: Diagnosis not present

## 2023-08-25 DIAGNOSIS — I129 Hypertensive chronic kidney disease with stage 1 through stage 4 chronic kidney disease, or unspecified chronic kidney disease: Secondary | ICD-10-CM | POA: Diagnosis not present

## 2023-08-25 DIAGNOSIS — E860 Dehydration: Secondary | ICD-10-CM | POA: Diagnosis not present

## 2023-08-25 DIAGNOSIS — Z5111 Encounter for antineoplastic chemotherapy: Secondary | ICD-10-CM | POA: Diagnosis not present

## 2023-08-25 MED ORDER — HEPARIN SOD (PORK) LOCK FLUSH 100 UNIT/ML IV SOLN
500.0000 [IU] | Freq: Once | INTRAVENOUS | Status: AC | PRN
  Administered 2023-08-25: 500 [IU]

## 2023-08-25 MED ORDER — SODIUM CHLORIDE 0.9 % IV SOLN
Freq: Once | INTRAVENOUS | Status: AC
Start: 2023-08-25 — End: 2023-08-25

## 2023-08-25 MED ORDER — SODIUM CHLORIDE 0.9% FLUSH
10.0000 mL | INTRAVENOUS | Status: DC | PRN
  Administered 2023-08-25: 10 mL

## 2023-08-25 NOTE — Patient Instructions (Signed)

## 2023-08-26 ENCOUNTER — Other Ambulatory Visit: Payer: Self-pay

## 2023-08-29 LAB — MISCELLANEOUS TEST

## 2023-08-30 ENCOUNTER — Encounter: Payer: Self-pay | Admitting: Hematology and Oncology

## 2023-08-30 ENCOUNTER — Inpatient Hospital Stay

## 2023-08-30 ENCOUNTER — Ambulatory Visit: Attending: Cardiology

## 2023-08-30 VITALS — BP 131/71 | HR 99 | Temp 97.7°F | Resp 18

## 2023-08-30 DIAGNOSIS — E860 Dehydration: Secondary | ICD-10-CM | POA: Diagnosis not present

## 2023-08-30 DIAGNOSIS — E785 Hyperlipidemia, unspecified: Secondary | ICD-10-CM | POA: Diagnosis not present

## 2023-08-30 DIAGNOSIS — R0609 Other forms of dyspnea: Secondary | ICD-10-CM | POA: Diagnosis not present

## 2023-08-30 DIAGNOSIS — Z5111 Encounter for antineoplastic chemotherapy: Secondary | ICD-10-CM | POA: Diagnosis not present

## 2023-08-30 DIAGNOSIS — I129 Hypertensive chronic kidney disease with stage 1 through stage 4 chronic kidney disease, or unspecified chronic kidney disease: Secondary | ICD-10-CM | POA: Diagnosis not present

## 2023-08-30 DIAGNOSIS — C169 Malignant neoplasm of stomach, unspecified: Secondary | ICD-10-CM | POA: Diagnosis not present

## 2023-08-30 DIAGNOSIS — E039 Hypothyroidism, unspecified: Secondary | ICD-10-CM | POA: Diagnosis not present

## 2023-08-30 DIAGNOSIS — R7989 Other specified abnormal findings of blood chemistry: Secondary | ICD-10-CM

## 2023-08-30 DIAGNOSIS — E876 Hypokalemia: Secondary | ICD-10-CM

## 2023-08-30 LAB — ECHOCARDIOGRAM COMPLETE
AR max vel: 1.76 cm2
AV Area VTI: 1.56 cm2
AV Area mean vel: 1.74 cm2
AV Mean grad: 8 mmHg
AV Peak grad: 14.3 mmHg
Ao pk vel: 1.89 m/s
Area-P 1/2: 3.93 cm2
MV M vel: 5.63 m/s
MV Peak grad: 126.8 mmHg
S' Lateral: 3 cm

## 2023-08-30 MED ORDER — HEPARIN SOD (PORK) LOCK FLUSH 100 UNIT/ML IV SOLN
500.0000 [IU] | Freq: Once | INTRAVENOUS | Status: AC | PRN
  Administered 2023-08-30: 500 [IU]

## 2023-08-30 MED ORDER — SODIUM CHLORIDE 0.9 % IV SOLN
Freq: Once | INTRAVENOUS | Status: AC
Start: 2023-08-30 — End: 2023-08-30

## 2023-08-30 MED ORDER — SODIUM CHLORIDE 0.9% FLUSH
3.0000 mL | Freq: Once | INTRAVENOUS | Status: AC | PRN
  Administered 2023-08-30: 10 mL

## 2023-08-30 NOTE — Patient Instructions (Signed)

## 2023-09-02 ENCOUNTER — Telehealth: Payer: Self-pay

## 2023-09-02 ENCOUNTER — Ambulatory Visit: Payer: Self-pay | Admitting: Cardiology

## 2023-09-02 NOTE — Telephone Encounter (Signed)
 LVM per DPR- per Dr. Vanetta Shawl note regarding normal Echo results. Encouraged to call with any questions. Routed to PCP.

## 2023-09-06 ENCOUNTER — Other Ambulatory Visit: Payer: Self-pay | Admitting: Oncology

## 2023-09-06 ENCOUNTER — Inpatient Hospital Stay

## 2023-09-06 DIAGNOSIS — C168 Malignant neoplasm of overlapping sites of stomach: Secondary | ICD-10-CM

## 2023-09-09 NOTE — Progress Notes (Signed)
 [facility]  [address] [place],  [place]  [address] [phone]  Clinic Day:  09/13/23  Referring physician: Jefferey Fitch, MD  ASSESSMENT & PLAN:  Assessment: Gastric cancer (HCC) Stage IVB (T4 N0 M1) poorly differentiated adenocarcinoma of the stomach with signet ring features and ulceration, metastatic to the omentum diagnosed in July 2023.  Stain for HER2 was negative.  PET scan revealed omental involvement. She received palliative chemotherapy with FOLFOX/nivolumab  (5-fluorouracil /leucovorin /oxaliplatin /nivolumab ). Oxaliplatin  was discontinued after 11 cycles, and she continues 5-fluorouracil /leucovorin /nivolumab .  PET in February 2024 revealed resolution of metabolic activity associated with the stomach.  Decrease in size of omental nodularity with no associated metabolic activity.   EGD in March revealed residual disease. CT imaging every few months has remained stable. She has had chronic kidney disease since developing immune mediated nephritis in February 2024, which resolved with high-dose steroids and holding nivolumab .  She receives extra IV fluids weekly to prevent recurrent acute kidney injury.   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   Her disease has remained stable with maintenance fluorouracil /leucovorin /nivolumab .  Most recent CT chest, abdomen and pelvis in May of 2025 revealed persistent nonspecific diffuse gastric wall thickening with persistent subtle peritoneal nodularity inferior to the gastric body and antrum. Stable small solid pulmonary nodules in the left lung base measuring up to 6 mm, favored benign etiology. Following that, we changed her schedule from every 2 weeks to every 3 weeks.She was found to have Campylobacter in the stool after her 39th cycle of 5-fluorouracil /leucovorin /nivolumab  cycle.  She was treated with ciprofloxacin  with resolution of her diarrhea.  She has had persistent epigastric pain, so I recommended she see Dr.  Larene for consideration of repeat EGD. I am concerned by her decreased appetite and increased epigastric discomfort so we will get scans in early August.    Drug-induced interstitial nephritis History immune mediated nephritis from immunotherapy in February, which resolved with prednisone .  She has had chronic kidney disease since that episode.  She will continue to get weekly fluids.  She knows to push oral fluids.     Acute deep vein thrombosis (DVT) of popliteal vein of right lower extremity (HCC) Occlusive deep venous thrombosis along the popliteal and peroneal vein diagnosed in March 2025. She was placed on apixaban  10 mg twice daily for 7 days, then apixaban  5 mg twice daily indefinitely.  Aspirin 81 mg daily was discontinued.  The patient states she continues apixaban  5 mg twice daily without difficulty.   Plan: I will refill her Zofran  4 mg today. Her last scan was at the end of May and I recommended a repeat scan in August. She inquired if she should still have an EGD done and I encouraged her to still schedule this as this will give us  more information than a scan. Patient had an echocardiogram done on 08/30/2023 which revealed an ejection fraction of 60-65% with an gls of -12.2%. Her day 1 cycle 41 of FOLFOX and nivolumab  is scheduled on 09/13/2023 and her day 3 pump D/C, cycle 41 is scheduled on 09/15/2023. She has a WBC of 5.3, low hemoglobin of 11.6 down from 11.8, and platelet count of 181,000. Her CMP is normal other than an elevated creatinine of 1.29 and low total protein of 6.2. Her TSH, T4, and free T4 today are pending. She receives weekly IV fluids and [name] back in 3 weeks with CBC, CMP, TSH, T4, and CT of chest, abdomen and pelvis.  The patient understands the  plans discussed today and is in agreement with them.  She knows to contact our office if she develops concerns prior to her next appointment.  I provided 19 minutes of face-to-face time during this encounter  and > 50% was spent counseling as documented under my assessment and plan.   [name], MD  [facility] - A DEPT OF [facility] [address] [place] [address] Dept: [phone] Dept Fax: [phone]    Orders Placed This Encounter  Procedures   Ambulatory referral to Gastroenterology    Referral Priority:   Routine    Referral Type:   Consultation    Referral Reason:   Specialty Services Required    Referred to Provider:   Misenheimer, Evalene, MD    Number of Visits Requested:   1      CHIEF COMPLAINT:  CC: Stage IVB gastric cancer  Current Treatment: 5-fluorouracil /leucovorin /nivolumab   HISTORY OF PRESENT ILLNESS:   Oncology History  Gastric cancer (HCC)  09/10/2021 Initial Diagnosis   Gastric cancer (HCC)   09/10/2021 Cancer Staging   Staging form: Stomach, AJCC 8th Edition - Clinical stage from 09/10/2021: Stage IVB (cT4b, cN0, cM1) - Signed by Cornish Wanda VEAR, MD on 09/10/2021 Histopathologic type: Adenocarcinoma, NOS Stage prefix: Initial diagnosis Total positive nodes: 0 Histologic grade (G): G3 Histologic grading system: 3 grade system Sites of metastasis: Peritoneal surface Diagnostic confirmation: Positive histology PLUS positive immunophenotyping and/or positive genetic studies Specimen type: Endoscopy with Biopsy Staged by: Managing physician Carcinoembryonic antigen (CEA) (ng/mL): 2.8 Carbohydrate antigen 19-9 (CA 19-9) (U/mL): 4.9 HER2 status: Unknown Microsatellite instability (MSI): Unknown Tumor location in stomach: Other Clinical staging modalities: Biopsy, Endoscopy Stage used in treatment planning: Yes National guidelines used in treatment planning: Yes Type of national guideline used in treatment planning: NCCN   09/28/2021 - 10/14/2021 Chemotherapy   Patient is on Treatment Plan : GASTROESOPHAGEAL FOLFOX + Nivolumab  q14d     09/28/2021 -  Chemotherapy   Patient is on  Treatment Plan : GASTROESOPHAGEAL FOLFOX + Nivolumab  q21d (changed from q14d on 04/12/23)     12/09/2021 Genetic Testing   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  Report date is 12/09/2021.   The Multi-Cancer + RNA Panel offered by [facility] includes sequencing and/or deletion/duplication analysis of the following 84 genes:  AIP*, ALK, APC*, ATM*, AXIN2*, BAP1*, BARD1*, BLM*, BMPR1A*, BRCA1*, BRCA2*, BRIP1*, CASR, CDC73*, CDH1*, CDK4, CDKN1B*, CDKN1C*, CDKN2A, CEBPA, CHEK2*, CTNNA1*, DICER1*, DIS3L2*, EGFR, EPCAM, FH*, FLCN*, GATA2*, GPC3, GREM1, HOXB13, HRAS, KIT, MAX*, MEN1*, MET, MITF, MLH1*, MSH2*, MSH3*, MSH6*, MUTYH*, NBN*, NF1*, NF2*, NTHL1*, PALB2*, PDGFRA, PHOX2B, PMS2*, POLD1*, POLE*, POT1*, PRKAR1A*, PTCH1*, PTEN*, RAD50*, RAD51C*, RAD51D*, RB1*, RECQL4, RET, RUNX1*, SDHA*, SDHAF2*, SDHB*, SDHC*, SDHD*, SMAD4*, SMARCA4*, SMARCB1*, SMARCE1*, STK11*, SUFU*, TERC, TERT, TMEM127*, Tp53*, TSC1*, TSC2*, VHL*, WRN*, and WT1.  RNA analysis is performed for * genes.     INTERVAL HISTORY:  Maegan is here today for repeat clinical assessment for his stage IVB gastric cancer. Patient states that she feels ok but complains of decreased appetite and abdominal discomfort. I will refill her Zofran  4 mg today. Her last scan was at the end of May and I recommended a repeat scan in August. She inquired if she should still have an EGD done and I encouraged her to still schedule this as this will give us  more information than a scan. Patient had an echocardiogram done on 08/30/2023 which revealed an ejection  fraction of 60-65% with an gls of -12.2%. Her day 1 cycle 41 of FOLFOX and nivolumab  is scheduled on 09/13/2023 and her day 3 pump D/C, cycle 41 is scheduled on 09/15/2023. She has a WBC of 5.3, low hemoglobin of 11.6 down from 11.8, and platelet count of 181,000. Her CMP is normal other than an elevated creatinine of 1.29 and low total protein of 6.2. Her TSH, T4, and  free T4 today are pending. She receives weekly IV fluids and [name] back in 3 weeks with CBC, CMP, TSH, T4 and CT of chest, abdomen and pelvis. She denies fever, chills, night sweats, or other signs of infection. She denies cardiorespiratory issues. Her appetite is poor and she makes herself eat. Her weight has been stable.  REVIEW OF SYSTEMS:   Review of Systems  Constitutional:  Positive for appetite change (poor). Negative for chills, diaphoresis, fatigue, fever and unexpected weight change.  HENT:  Negative.  Negative for lump/mass, mouth sores and sore throat.   Eyes: Negative.   Respiratory: Negative.  Negative for chest tightness, cough, hemoptysis, shortness of breath and wheezing.   Cardiovascular: Negative.  Negative for chest pain, leg swelling and palpitations.  Gastrointestinal:  Positive for abdominal pain (discomfort) and nausea (intermittent, mild). Negative for abdominal distention, blood in stool, constipation, diarrhea and vomiting.  Endocrine: Negative.  Negative for hot flashes.  Genitourinary: Negative.  Negative for difficulty urinating, dysuria, frequency, hematuria and vaginal bleeding.   Musculoskeletal:  Positive for arthralgias (left shoulder) and gait problem (due to left foot and ankle pain). Negative for back pain, flank pain, myalgias and neck pain.  Skin: Negative.  Negative for rash.  Neurological:  Positive for gait problem (due to left foot and ankle pain) and numbness (neuropathy feet). Negative for dizziness, extremity weakness, headaches, light-headedness, seizures and speech difficulty.  Hematological: Negative.  Negative for adenopathy. Does not bruise/bleed easily.  Psychiatric/Behavioral: Negative.  Negative for depression and sleep disturbance. The patient is not nervous/anxious.      VITALS:   Blood pressure (!) 149/75, pulse 93, temperature 98.2 F (36.8 C), temperature source Oral, resp. rate 18, height 5' 4 (1.626 m), weight 206 lb 12.8  oz (93.8 kg).  Wt Readings from Last 3 Encounters:  09/15/23 207 lb 1.3 oz (93.9 kg)  09/13/23 206 lb 12.8 oz (93.8 kg)  08/23/23 206 lb 11.2 oz (93.8 kg)    Body mass index is 35.5 kg/m.  Performance status (ECOG): 1 - Symptomatic but completely ambulatory  PHYSICAL EXAM:   Physical Exam Vitals and nursing note reviewed.  Constitutional:      General: She is not in acute distress.    Appearance: Normal appearance.  HENT:     Head: Normocephalic and atraumatic.     Right Ear: Tympanic membrane, ear canal and external ear normal. There is no impacted cerumen.     Left Ear: Tympanic membrane, ear canal and external ear normal. There is no impacted cerumen.     Nose: Nose normal. No congestion or rhinorrhea.     Mouth/Throat:     Mouth: Mucous membranes are moist.     Pharynx: Oropharynx is clear. No oropharyngeal exudate or posterior oropharyngeal erythema.  Eyes:     General: No scleral icterus.    Extraocular Movements: Extraocular movements intact.     Conjunctiva/sclera: Conjunctivae normal.     Pupils: Pupils are equal, round, and reactive to light.  Cardiovascular:     Rate and Rhythm: Normal rate and  regular rhythm.     Pulses: Normal pulses.     Heart sounds: Normal heart sounds. No murmur heard.    No friction rub. No gallop.  Pulmonary:     Effort: Pulmonary effort is normal.     Breath sounds: Normal breath sounds. No wheezing, rhonchi or rales.  Abdominal:     General: Bowel sounds are normal. There is no distension.     Palpations: Abdomen is soft. There is no hepatomegaly, splenomegaly or mass.     Tenderness: There is abdominal tenderness in the epigastric area.     Comments: Slight firmness at the epigastric area  Musculoskeletal:        General: Normal range of motion.     Cervical back: Normal range of motion and neck supple. No tenderness.     Right lower leg: No edema.     Left lower leg: No edema.     Left ankle: No swelling. No tenderness.   Lymphadenopathy:     Cervical: No cervical adenopathy.     Upper Body:     Right upper body: No supraclavicular or axillary adenopathy.     Left upper body: No supraclavicular or axillary adenopathy.     Lower Body: No right inguinal adenopathy. No left inguinal adenopathy.  Skin:    General: Skin is warm and dry.     Coloration: Skin is not jaundiced.     Findings: No rash.  Neurological:     Mental Status: She is alert and oriented to person, place, and time.     Cranial Nerves: No cranial nerve deficit.  Psychiatric:        Mood and Affect: Mood normal.        Behavior: Behavior normal.        Thought Content: Thought content normal.     LABS:      Latest Ref Rng & Units 09/13/2023    8:47 AM 08/23/2023    8:34 AM 08/12/2023   12:56 PM  CBC  WBC 4.0 - 10.5 K/uL 5.3  9.4  7.4   Hemoglobin 12.0 - 15.0 g/dL 88.3  88.1  88.0   Hematocrit 36.0 - 46.0 % 35.4  36.2  35.8   Platelets 150 - 400 K/uL 181  185  202       Latest Ref Rng & Units 09/13/2023    8:47 AM 08/23/2023    8:34 AM 08/12/2023   12:56 PM  CMP  Glucose 70 - 99 mg/dL 811  829  867   BUN 8 - 23 mg/dL 22  24  16    Creatinine 0.44 - 1.00 mg/dL 8.70  8.71  8.54   Sodium 135 - 145 mmol/L 138  138  138   Potassium 3.5 - 5.1 mmol/L 3.8  4.2  3.6   Chloride 98 - 111 mmol/L 104  105  105   CO2 22 - 32 mmol/L 22  22  20    Calcium  8.9 - 10.3 mg/dL 9.3  9.2  9.1   Total Protein 6.5 - 8.1 g/dL 6.2  6.2    Total Bilirubin 0.0 - 1.2 mg/dL 0.5  0.5    Alkaline Phos 38 - 126 U/L 101  95    AST 15 - 41 U/L 24  24    ALT 0 - 44 U/L 23  19     Lab Results  Component Value Date   TSH 1.459 09/13/2023   T4TOTAL 10.3 09/13/2023   Lab Results  Component  Value Date   CEA1 2.9 09/10/2021   /  CEA  Date Value Ref Range Status  09/10/2021 2.9 0.0 - 4.7 ng/mL Final    Comment:    (NOTE)                             Nonsmokers          <3.9                             Smokers             <5.6 [employer]  Electrochemiluminescence Immunoassay (ECLIA) Values obtained with different assay methods or kits cannot be used interchangeably.  Results cannot be interpreted as absolute evidence of the presence or absence of malignant disease. Performed At: [facility] [address] [place], [place] [id number] Jennette Shorter MD Ey:[phone]     STUDIES:   ECHOCARDIOGRAM COMPLETE Result Date: 08/30/2023    ECHOCARDIOGRAM REPORT   Patient Name:   ARYANAH ENSLOW Date of Exam: 08/30/2023 Medical Rec #:  [id number]      Height:       64.0 in Accession #:    [id number]     Weight:       206.7 lb Date of Birth:  1935-03-29      BSA:          1.984 m Patient Age:    88 years       BP:           136/61 mmHg Patient Gender: F              HR:           84 bpm. Exam Location:  [facility] [facility] Procedure: 2D Echo, Cardiac Doppler, Color Doppler and Strain Analysis (Both            Spectral and Color Flow Doppler were utilized during procedure). Indications:    Dyspnea on exertion [R06.09 (ICD-10-CM)]  History:        Patient has prior history of Echocardiogram examinations, most                 recent 07/02/2022. Signs/Symptoms:Edema; Risk                 Factors:Hypertension and Dyslipidemia. History of DVT.  Sonographer:    Charlie Jointer RDCS Referring Phys: [id number] ROBERT J KRASOWSKI IMPRESSIONS  1. Left ventricular ejection fraction, by estimation, is 60 to 65%. The left ventricle has normal function. The left ventricle has no regional wall motion abnormalities. Left ventricular diastolic parameters are consistent with Grade I diastolic dysfunction (impaired relaxation). The average left ventricular global longitudinal strain is -12.2 %. The global longitudinal strain is abnormal.  2. Right ventricular systolic function is normal. The right ventricular size is normal.  3. The mitral valve is normal in structure. No evidence of mitral valve regurgitation. No evidence of mitral stenosis.  4. The aortic valve is normal in  structure. Aortic valve regurgitation is not visualized. No aortic stenosis is present.  5. The inferior vena cava is normal in size with greater than 50% respiratory variability, suggesting right atrial pressure of 3 mmHg. FINDINGS  Left Ventricle: Left ventricular ejection fraction, by estimation, is 60 to 65%. The left ventricle has normal function. The left ventricle has no regional wall motion abnormalities. The average left ventricular global longitudinal strain is -12.2 %.  Strain was performed and the global longitudinal strain is abnormal. The left ventricular internal cavity size was normal in size. There is no left ventricular hypertrophy. Left ventricular diastolic parameters are consistent with Grade I diastolic dysfunction (impaired relaxation). Right Ventricle: The right ventricular size is normal. No increase in right ventricular wall thickness. Right ventricular systolic function is normal. Left Atrium: Left atrial size was normal in size. Right Atrium: Right atrial size was normal in size. Pericardium: There is no evidence of pericardial effusion. Mitral Valve: The mitral valve is normal in structure. No evidence of mitral valve regurgitation. No evidence of mitral valve stenosis. Tricuspid Valve: The tricuspid valve is normal in structure. Tricuspid valve regurgitation is not demonstrated. No evidence of tricuspid stenosis. Aortic Valve: The aortic valve is normal in structure. Aortic valve regurgitation is not visualized. No aortic stenosis is present. Aortic valve mean gradient measures 8.0 mmHg. Aortic valve peak gradient measures 14.3 mmHg. Aortic valve area, by VTI measures 1.56 cm. Pulmonic Valve: The pulmonic valve was normal in structure. Pulmonic valve regurgitation is not visualized. No evidence of pulmonic stenosis. Aorta: The aortic root is normal in size and structure. Venous: The inferior vena cava is normal in size with greater than 50% respiratory variability, suggesting right  atrial pressure of 3 mmHg. IAS/Shunts: No atrial level shunt detected by color flow Doppler.  LEFT VENTRICLE PLAX 2D LVIDd:         4.50 cm   Diastology LVIDs:         3.00 cm   LV e' medial:    6.53 cm/s LV PW:         1.20 cm   LV E/e' medial:  10.3 LV IVS:        1.20 cm   LV e' lateral:   7.18 cm/s LVOT diam:     2.00 cm   LV E/e' lateral: 9.4 LV SV:         58 LV SV Index:   29        2D Longitudinal Strain LVOT Area:     3.14 cm  2D Strain GLS Avg:     -12.2 %  RIGHT VENTRICLE             IVC RV Basal diam:  3.10 cm     IVC diam: 1.40 cm RV Mid diam:    2.50 cm RV S prime:     16.30 cm/s TAPSE (M-mode): 2.4 cm LEFT ATRIUM             Index        RIGHT ATRIUM           Index LA diam:        3.40 cm 1.71 cm/m   RA Area:     12.60 cm LA Vol (A2C):   36.4 ml 18.35 ml/m  RA Volume:   25.40 ml  12.80 ml/m LA Vol (A4C):   51.1 ml 25.76 ml/m LA Biplane Vol: 43.3 ml 21.82 ml/m  AORTIC VALVE AV Area (Vmax):    1.76 cm AV Area (Vmean):   1.74 cm AV Area (VTI):     1.56 cm AV Vmax:           189.00 cm/s AV Vmean:          135.000 cm/s AV VTI:            0.373 m AV Peak Grad:      14.3 mmHg AV Mean Grad:  8.0 mmHg LVOT Vmax:         106.17 cm/s LVOT Vmean:        74.833 cm/s LVOT VTI:          0.185 m LVOT/AV VTI ratio: 0.50  AORTA Ao Root diam: 2.70 cm Ao Asc diam:  3.10 cm Ao Desc diam: 2.00 cm MITRAL VALVE MV Area (PHT): 3.93 cm     SHUNTS MV Decel Time: 193 msec     Systemic VTI:  0.18 m MR Peak grad: 126.8 mmHg    Systemic Diam: 2.00 cm MR Vmax:      563.00 cm/s MV E velocity: 67.20 cm/s MV A velocity: 131.00 cm/s MV E/A ratio:  0.51 Jennifer Crape MD Electronically signed by Jennifer Crape MD Signature Date/Time: 08/30/2023/12:31:16 PM    Final     Exam: 06/30/2023 CT CHEST ABDOMEN PELVIS W CONTRAST  IMPRESSION: Persistent nonspecific diffuse gastric wall thickening with persistent subtle peritoneal nodularity inferior to the gastric body and antrum. The possibility of early peritoneal  carcinomatosis cannot entirely be excluded. No convincing evidence for metastatic disease within the chest. Stable pulmonary nodules measuring up to 6 mm. Attention on follow-up.    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  If that happens, she is to call us  so that we can schedule her for an ultrasound to look for synovitis.     Essential hypertension 09/09/2016   Gastric cancer (HCC) 09/10/2021   Hyperlipidemia 09/01/2016   Hypertension    Hypothyroidism 09/01/2016   Osteoarthritis of both feet 01/20/2016   Osteoarthritis, hand 01/20/2016   Thyroid  disease     Past Surgical History:  Procedure Laterality Date   APPENDECTOMY     LAPAROSCOPIC APPENDECTOMY N/A 04/10/2016   Procedure: APPENDECTOMY LAPAROSCOPIC;  Surgeon: Vicenta Poli, MD;  Location: [facility] OR;  Service: General;  Laterality: N/A;    Family History  Problem Relation Age of Onset   Hypertension Mother    Prostate cancer Father        metastatic; d. 62   Brain cancer Sister 65   Breast cancer Sister 7   AAA (abdominal aortic aneurysm) Brother    Leukemia Cousin        x2 maternal female cousins; d. before 92   Breast cancer Daughter 7       DCIS    Social History:  reports that she has never smoked. She has never used smokeless tobacco. She reports that she does not currently use alcohol . She reports that she does not use drugs.The patient  is alone today.  Allergies:  Allergies  Allergen Reactions   Doxycycline Rash   Sulfa Antibiotics Other (See Comments) and Rash    Other reaction(s): Other (See Comments)  Made me feel weird    Current Medications: Current Outpatient Medications  Medication Sig Dispense Refill   apixaban  (ELIQUIS ) 5 MG TABS tablet Take 1 tablet (5 mg total) by mouth 2 (two) times daily.  180 tablet 1   b complex vitamins capsule Take 1 capsule by mouth daily.     Calcium  Carbonate (CALCIUM  600 PO) Take 1 tablet by mouth daily.     Cholecalciferol (VITAMIN D3) 5000 units CAPS Take 1 capsule by mouth daily.     famotidine (PEPCID) 40 MG tablet Take 40 mg by mouth at bedtime.     hydrochlorothiazide (HYDRODIURIL) 25 MG tablet Take 25 mg by mouth daily.     KRILL OIL PO Take 1 capsule by mouth daily. Unknown strenght     Lactobacillus TABS Take 1 tablet by mouth 2 (two) times daily.     levothyroxine  (SYNTHROID , LEVOTHROID) 75 MCG tablet Take 75 mcg by mouth daily before breakfast.      loratadine (CLARITIN) 10 MG tablet Take 10 mg by mouth daily.     LORazepam  (ATIVAN ) 0.5 MG tablet Take 1 tablet (0.5 mg total) by mouth every 8 (eight) hours. 30 tablet 0   NON FORMULARY Take 1 Dose by mouth See admin instructions. MMW: 3 parts Maalox 2 parts Benadryl  1 part viscious lidicaine  Disp. 6oz  Instructions: 5ml swish and swallow every 3-4 hours     omeprazole  (PRILOSEC) 40 MG capsule Take 1 capsule (40 mg total) by mouth 2 (two) times daily. 60 capsule 5   polyethylene glycol powder (GLYCOLAX /MIRALAX ) 17 GM/SCOOP powder Take 1 Container by mouth daily.     potassium chloride  SA (KLOR-CON  M) 20 MEQ tablet TAKE 1 TABLET TWICE A DAY 180 tablet 3   pravastatin  (PRAVACHOL ) 20 MG tablet Take 1 tablet (20 mg total) by mouth every evening. 90 tablet 1   Probiotic Product (PROBIOTIC DAILY PO) Take 1 tablet by mouth daily.     prochlorperazine  (COMPAZINE ) 10 MG tablet Take 1 tablet (10 mg total) by mouth every 6  (six) hours as needed for nausea or vomiting. 90 tablet 3   valsartan (DIOVAN) 160 MG tablet Take 160 mg by mouth daily.     azithromycin  (ZITHROMAX ) 250 MG tablet Take as directed on packet 6 each 0   ondansetron  (ZOFRAN -ODT) 4 MG disintegrating tablet Take 1 tablet (4 mg total) by mouth every 8 (eight) hours as needed for nausea or vomiting. 90 tablet 3   No current facility-administered medications for this visit.      I,Jasmine M Lassiter,acting as a scribe for [name], MD.,have documented all relevant documentation on the behalf of [name], MD,as directed by  [name], MD while in the presence of [name], MD.

## 2023-09-13 ENCOUNTER — Other Ambulatory Visit: Payer: Self-pay | Admitting: Oncology

## 2023-09-13 ENCOUNTER — Inpatient Hospital Stay

## 2023-09-13 ENCOUNTER — Inpatient Hospital Stay (HOSPITAL_BASED_OUTPATIENT_CLINIC_OR_DEPARTMENT_OTHER): Admitting: Oncology

## 2023-09-13 ENCOUNTER — Telehealth: Payer: Self-pay | Admitting: Oncology

## 2023-09-13 ENCOUNTER — Other Ambulatory Visit: Payer: Self-pay

## 2023-09-13 VITALS — BP 149/75 | HR 93 | Temp 98.2°F | Resp 18 | Ht 64.0 in | Wt 206.8 lb

## 2023-09-13 DIAGNOSIS — I129 Hypertensive chronic kidney disease with stage 1 through stage 4 chronic kidney disease, or unspecified chronic kidney disease: Secondary | ICD-10-CM | POA: Diagnosis not present

## 2023-09-13 DIAGNOSIS — E860 Dehydration: Secondary | ICD-10-CM | POA: Diagnosis not present

## 2023-09-13 DIAGNOSIS — E785 Hyperlipidemia, unspecified: Secondary | ICD-10-CM | POA: Diagnosis not present

## 2023-09-13 DIAGNOSIS — C168 Malignant neoplasm of overlapping sites of stomach: Secondary | ICD-10-CM

## 2023-09-13 DIAGNOSIS — C169 Malignant neoplasm of stomach, unspecified: Secondary | ICD-10-CM | POA: Diagnosis not present

## 2023-09-13 DIAGNOSIS — E039 Hypothyroidism, unspecified: Secondary | ICD-10-CM | POA: Diagnosis not present

## 2023-09-13 DIAGNOSIS — Z5111 Encounter for antineoplastic chemotherapy: Secondary | ICD-10-CM | POA: Diagnosis not present

## 2023-09-13 LAB — CBC WITH DIFFERENTIAL (CANCER CENTER ONLY)
Abs Immature Granulocytes: 0.01 K/uL (ref 0.00–0.07)
Basophils Absolute: 0 K/uL (ref 0.0–0.1)
Basophils Relative: 0 %
Eosinophils Absolute: 0.4 K/uL (ref 0.0–0.5)
Eosinophils Relative: 7 %
HCT: 35.4 % — ABNORMAL LOW (ref 36.0–46.0)
Hemoglobin: 11.6 g/dL — ABNORMAL LOW (ref 12.0–15.0)
Immature Granulocytes: 0 %
Lymphocytes Relative: 35 %
Lymphs Abs: 1.8 K/uL (ref 0.7–4.0)
MCH: 27.5 pg (ref 26.0–34.0)
MCHC: 32.8 g/dL (ref 30.0–36.0)
MCV: 83.9 fL (ref 80.0–100.0)
Monocytes Absolute: 0.5 K/uL (ref 0.1–1.0)
Monocytes Relative: 10 %
Neutro Abs: 2.6 K/uL (ref 1.7–7.7)
Neutrophils Relative %: 48 %
Platelet Count: 181 K/uL (ref 150–400)
RBC: 4.22 MIL/uL (ref 3.87–5.11)
RDW: 14.9 % (ref 11.5–15.5)
WBC Count: 5.3 K/uL (ref 4.0–10.5)
nRBC: 0 % (ref 0.0–0.2)

## 2023-09-13 LAB — CMP (CANCER CENTER ONLY)
ALT: 23 U/L (ref 0–44)
AST: 24 U/L (ref 15–41)
Albumin: 3.9 g/dL (ref 3.5–5.0)
Alkaline Phosphatase: 101 U/L (ref 38–126)
Anion gap: 12 (ref 5–15)
BUN: 22 mg/dL (ref 8–23)
CO2: 22 mmol/L (ref 22–32)
Calcium: 9.3 mg/dL (ref 8.9–10.3)
Chloride: 104 mmol/L (ref 98–111)
Creatinine: 1.29 mg/dL — ABNORMAL HIGH (ref 0.44–1.00)
GFR, Estimated: 40 mL/min — ABNORMAL LOW (ref >60)
Glucose, Bld: 188 mg/dL — ABNORMAL HIGH (ref 70–99)
Potassium: 3.8 mmol/L (ref 3.5–5.1)
Sodium: 138 mmol/L (ref 135–145)
Total Bilirubin: 0.5 mg/dL (ref 0.0–1.2)
Total Protein: 6.2 g/dL — ABNORMAL LOW (ref 6.5–8.1)

## 2023-09-13 LAB — TSH: TSH: 1.459 u[IU]/mL (ref 0.350–4.500)

## 2023-09-13 LAB — T4, FREE: Free T4: 1.15 ng/dL — ABNORMAL HIGH (ref 0.61–1.12)

## 2023-09-13 MED ORDER — SODIUM CHLORIDE 0.9 % IV SOLN
1960.0000 mg/m2 | INTRAVENOUS | Status: DC
  Administered 2023-09-13: 3850 mg via INTRAVENOUS
  Filled 2023-09-13: qty 77

## 2023-09-13 MED ORDER — SODIUM CHLORIDE 0.9 % IV SOLN
Freq: Once | INTRAVENOUS | Status: AC
Start: 2023-09-13 — End: 2023-09-13

## 2023-09-13 MED ORDER — LEUCOVORIN CALCIUM INJECTION 350 MG
320.0000 mg/m2 | Freq: Once | INTRAVENOUS | Status: AC
  Administered 2023-09-13: 630 mg via INTRAVENOUS
  Filled 2023-09-13: qty 31.5

## 2023-09-13 MED ORDER — SODIUM CHLORIDE 0.9 % IV SOLN
240.0000 mg | Freq: Once | INTRAVENOUS | Status: AC
  Administered 2023-09-13: 240 mg via INTRAVENOUS
  Filled 2023-09-13: qty 24

## 2023-09-13 MED ORDER — DEXAMETHASONE SODIUM PHOSPHATE 10 MG/ML IJ SOLN
10.0000 mg | Freq: Once | INTRAMUSCULAR | Status: AC
  Administered 2023-09-13: 10 mg via INTRAVENOUS
  Filled 2023-09-13: qty 1

## 2023-09-13 MED ORDER — FLUOROURACIL CHEMO INJECTION 2.5 GM/50ML
320.0000 mg/m2 | Freq: Once | INTRAVENOUS | Status: AC
  Administered 2023-09-13: 650 mg via INTRAVENOUS
  Filled 2023-09-13: qty 13

## 2023-09-13 MED ORDER — ONDANSETRON 4 MG PO TBDP: 4.0000 mg | ORAL_TABLET | Freq: Three times a day (TID) | ORAL | 3 refills | Status: AC | PRN

## 2023-09-13 MED ORDER — PALONOSETRON HCL INJECTION 0.25 MG/5ML
0.2500 mg | Freq: Once | INTRAVENOUS | Status: AC
  Administered 2023-09-13: 0.25 mg via INTRAVENOUS
  Filled 2023-09-13: qty 5

## 2023-09-13 NOTE — Telephone Encounter (Signed)
 Patient has been scheduled for follow-up visit per 09/13/23 LOS.  Pt given an appt calendar with date and time.

## 2023-09-13 NOTE — Patient Instructions (Signed)
Fluorouracil Injection What is this medication? FLUOROURACIL (flure oh YOOR a sil) treats some types of cancer. It works by slowing down the growth of cancer cells. This medicine may be used for other purposes; ask your health care provider or pharmacist if you have questions. COMMON BRAND NAME(S): Adrucil What should I tell my care team before I take this medication? They need to know if you have any of these conditions: Blood disorders Dihydropyrimidine dehydrogenase (DPD) deficiency Infection, such as chickenpox, cold sores, herpes Kidney disease Liver disease Poor nutrition Recent or ongoing radiation therapy An unusual or allergic reaction to fluorouracil, other medications, foods, dyes, or preservatives If you or your partner are pregnant or trying to get pregnant Breast-feeding How should I use this medication? This medication is injected into a vein. It is administered by your care team in a hospital or clinic setting. Talk to your care team about the use of this medication in children. Special care may be needed. Overdosage: If you think you have taken too much of this medicine contact a poison control center or emergency room at once. NOTE: This medicine is only for you. Do not share this medicine with others. What if I miss a dose? Keep appointments for follow-up doses. It is important not to miss your dose. Call your care team if you are unable to keep an appointment. What may interact with this medication? Do not take this medication with any of the following: Live virus vaccines This medication may also interact with the following: Medications that treat or prevent blood clots, such as warfarin, enoxaparin, dalteparin This list may not describe all possible interactions. Give your health care provider a list of all the medicines, herbs, non-prescription drugs, or dietary supplements you use. Also tell them if you smoke, drink alcohol, or use illegal drugs. Some items may  interact with your medicine. What should I watch for while using this medication? Your condition will be monitored carefully while you are receiving this medication. This medication may make you feel generally unwell. This is not uncommon as chemotherapy can affect healthy cells as well as cancer cells. Report any side effects. Continue your course of treatment even though you feel ill unless your care team tells you to stop. In some cases, you may be given additional medications to help with side effects. Follow all directions for their use. This medication may increase your risk of getting an infection. Call your care team for advice if you get a fever, chills, sore throat, or other symptoms of a cold or flu. Do not treat yourself. Try to avoid being around people who are sick. This medication may increase your risk to bruise or bleed. Call your care team if you notice any unusual bleeding. Be careful brushing or flossing your teeth or using a toothpick because you may get an infection or bleed more easily. If you have any dental work done, tell your dentist you are receiving this medication. Avoid taking medications that contain aspirin, acetaminophen, ibuprofen, naproxen, or ketoprofen unless instructed by your care team. These medications may hide a fever. Do not treat diarrhea with over the counter products. Contact your care team if you have diarrhea that lasts more than 2 days or if it is severe and watery. This medication can make you more sensitive to the sun. Keep out of the sun. If you cannot avoid being in the sun, wear protective clothing and sunscreen. Do not use sun lamps, tanning beds, or tanning booths. Talk to   your care team if you or your partner wish to become pregnant or think you might be pregnant. This medication can cause serious birth defects if taken during pregnancy and for 3 months after the last dose. A reliable form of contraception is recommended while taking this  medication and for 3 months after the last dose. Talk to your care team about effective forms of contraception. Do not father a child while taking this medication and for 3 months after the last dose. Use a condom while having sex during this time period. Do not breastfeed while taking this medication. This medication may cause infertility. Talk to your care team if you are concerned about your fertility. What side effects may I notice from receiving this medication? Side effects that you should report to your care team as soon as possible: Allergic reactions--skin rash, itching, hives, swelling of the face, lips, tongue, or throat Heart attack--pain or tightness in the chest, shoulders, arms, or jaw, nausea, shortness of breath, cold or clammy skin, feeling faint or lightheaded Heart failure--shortness of breath, swelling of the ankles, feet, or hands, sudden weight gain, unusual weakness or fatigue Heart rhythm changes--fast or irregular heartbeat, dizziness, feeling faint or lightheaded, chest pain, trouble breathing High ammonia level--unusual weakness or fatigue, confusion, loss of appetite, nausea, vomiting, seizures Infection--fever, chills, cough, sore throat, wounds that don't heal, pain or trouble when passing urine, general feeling of discomfort or being unwell Low red blood cell level--unusual weakness or fatigue, dizziness, headache, trouble breathing Pain, tingling, or numbness in the hands or feet, muscle weakness, change in vision, confusion or trouble speaking, loss of balance or coordination, trouble walking, seizures Redness, swelling, and blistering of the skin over hands and feet Severe or prolonged diarrhea Unusual bruising or bleeding Side effects that usually do not require medical attention (report to your care team if they continue or are bothersome): Dry skin Headache Increased tears Nausea Pain, redness, or swelling with sores inside the mouth or throat Sensitivity  to light Vomiting This list may not describe all possible side effects. Call your doctor for medical advice about side effects. You may report side effects to FDA at 1-800-FDA-1088. Where should I keep my medication? This medication is given in a hospital or clinic. It will not be stored at home. NOTE: This sheet is a summary. It may not cover all possible information. If you have questions about this medicine, talk to your doctor, pharmacist, or health care provider.  2024 [employer] Standard (2021-06-09 00:00:00) Leucovorin Injection What is this medication? LEUCOVORIN (loo koe VOR in) prevents side effects from certain medications, such as methotrexate. It works by increasing folate levels. This helps protect healthy cells in your body. It may also be used to treat anemia caused by low levels of folate. It can also be used with fluorouracil, a type of chemotherapy, to treat colorectal cancer. It works by increasing the effects of fluorouracil in the body. This medicine may be used for other purposes; ask your health care provider or pharmacist if you have questions. What should I tell my care team before I take this medication? They need to know if you have any of these conditions: Anemia from low levels of vitamin B12 in the blood An unusual or allergic reaction to leucovorin, folic acid, other medications, foods, dyes, or preservatives Pregnant or trying to get pregnant Breastfeeding How should I use this medication? This medication is injected into a vein or a muscle. It is given by your care team   in a hospital or clinic setting. Talk to your care team about the use of this medication in children. Special care may be needed. Overdosage: If you think you have taken too much of this medicine contact a poison control center or emergency room at once. NOTE: This medicine is only for you. Do not share this medicine with others. What if I miss a dose? Keep appointments for follow-up doses.  It is important not to miss your dose. Call your care team if you are unable to keep an appointment. What may interact with this medication? Capecitabine Fluorouracil Phenobarbital Phenytoin Primidone Trimethoprim;sulfamethoxazole This list may not describe all possible interactions. Give your health care provider a list of all the medicines, herbs, non-prescription drugs, or dietary supplements you use. Also tell them if you smoke, drink alcohol, or use illegal drugs. Some items may interact with your medicine. What should I watch for while using this medication? Your condition will be monitored carefully while you are receiving this medication. This medication may increase the side effects of 5-fluorouracil. Tell your care team if you have diarrhea or mouth sores that do not get better or that get worse. What side effects may I notice from receiving this medication? Side effects that you should report to your care team as soon as possible: Allergic reactions--skin rash, itching, hives, swelling of the face, lips, tongue, or throat This list may not describe all possible side effects. Call your doctor for medical advice about side effects. You may report side effects to FDA at 1-800-FDA-1088. Where should I keep my medication? This medication is given in a hospital or clinic. It will not be stored at home. NOTE: This sheet is a summary. It may not cover all possible information. If you have questions about this medicine, talk to your doctor, pharmacist, or health care provider.  2024 [employer] Standard (2021-07-07 00:00:00) Nivolumab Injection What is this medication? NIVOLUMAB (nye VOL ue mab) treats some types of cancer. It works by helping your immune system slow or stop the spread of cancer cells. It is a monoclonal antibody. This medicine may be used for other purposes; ask your health care provider or pharmacist if you have questions. COMMON BRAND NAME(S): Opdivo What should I  tell my care team before I take this medication? They need to know if you have any of these conditions: Allogeneic stem cell transplant (uses someone else's stem cells) Autoimmune diseases, such as Crohn disease, ulcerative colitis, lupus History of chest radiation Nervous system problems, such as Guillain-Barre syndrome or myasthenia gravis Organ transplant An unusual or allergic reaction to nivolumab, other medications, foods, dyes, or preservatives Pregnant or trying to get pregnant Breast-feeding How should I use this medication? This medication is infused into a vein. It is given in a hospital or clinic setting. A special MedGuide will be given to you before each treatment. Be sure to read this information carefully each time. Talk to your care team about the use of this medication in children. While it may be prescribed for children as young as 12 years for selected conditions, precautions do apply. Overdosage: If you think you have taken too much of this medicine contact a poison control center or emergency room at once. NOTE: This medicine is only for you. Do not share this medicine with others. What if I miss a dose? Keep appointments for follow-up doses. It is important not to miss your dose. Call your care team if you are unable to keep an appointment. What may   interact with this medication? Interactions have not been studied. This list may not describe all possible interactions. Give your health care provider a list of all the medicines, herbs, non-prescription drugs, or dietary supplements you use. Also tell them if you smoke, drink alcohol, or use illegal drugs. Some items may interact with your medicine. What should I watch for while using this medication? Your condition will be monitored carefully while you are receiving this medication. You may need blood work while taking this medication. This medication may cause serious skin reactions. They can happen weeks to months after  starting the medication. Contact your care team right away if you notice fevers or flu-like symptoms with a rash. The rash may be red or purple and then turn into blisters or peeling of the skin. You may also notice a red rash with swelling of the face, lips, or lymph nodes in your neck or under your arms. Tell your care team right away if you have any change in your eyesight. Talk to your care team if you are pregnant or think you might be pregnant. A negative pregnancy test is required before starting this medication. A reliable form of contraception is recommended while taking this medication and for 5 months after the last dose. Talk to your care team about effective forms of contraception. Do not breast-feed while taking this medication and for 5 months after the last dose. What side effects may I notice from receiving this medication? Side effects that you should report to your care team as soon as possible: Allergic reactions--skin rash, itching, hives, swelling of the face, lips, tongue, or throat Dry cough, shortness of breath or trouble breathing Eye pain, redness, irritation, or discharge with blurry or decreased vision Heart muscle inflammation--unusual weakness or fatigue, shortness of breath, chest pain, fast or irregular heartbeat, dizziness, swelling of the ankles, feet, or hands Hormone gland problems--headache, sensitivity to light, unusual weakness or fatigue, dizziness, fast or irregular heartbeat, increased sensitivity to cold or heat, excessive sweating, constipation, hair loss, increased thirst or amount of urine, tremors or shaking, irritability Infusion reactions--chest pain, shortness of breath or trouble breathing, feeling faint or lightheaded Kidney injury (glomerulonephritis)--decrease in the amount of urine, red or dark brown urine, foamy or bubbly urine, swelling of the ankles, hands, or feet Liver injury--right upper belly pain, loss of appetite, nausea, light-colored  stool, dark yellow or brown urine, yellowing skin or eyes, unusual weakness or fatigue Pain, tingling, or numbness in the hands or feet, muscle weakness, change in vision, confusion or trouble speaking, loss of balance or coordination, trouble walking, seizures Rash, fever, and swollen lymph nodes Redness, blistering, peeling, or loosening of the skin, including inside the mouth Sudden or severe stomach pain, bloody diarrhea, fever, nausea, vomiting Side effects that usually do not require medical attention (report these to your care team if they continue or are bothersome): Bone, joint, or muscle pain Diarrhea Fatigue Loss of appetite Nausea Skin rash This list may not describe all possible side effects. Call your doctor for medical advice about side effects. You may report side effects to FDA at 1-800-FDA-1088. Where should I keep my medication? This medication is given in a hospital or clinic. It will not be stored at home. NOTE: This sheet is a summary. It may not cover all possible information. If you have questions about this medicine, talk to your doctor, pharmacist, or health care provider.  2024 [employer] Standard (2021-06-01 00:00:00)  

## 2023-09-14 ENCOUNTER — Other Ambulatory Visit: Payer: Self-pay

## 2023-09-14 ENCOUNTER — Ambulatory Visit: Admitting: Podiatry

## 2023-09-14 LAB — T4: T4, Total: 10.3 ug/dL (ref 4.5–12.0)

## 2023-09-15 ENCOUNTER — Inpatient Hospital Stay

## 2023-09-15 VITALS — BP 138/54 | HR 84 | Temp 98.6°F | Resp 20 | Ht 64.0 in | Wt 207.1 lb

## 2023-09-15 DIAGNOSIS — I129 Hypertensive chronic kidney disease with stage 1 through stage 4 chronic kidney disease, or unspecified chronic kidney disease: Secondary | ICD-10-CM | POA: Diagnosis not present

## 2023-09-15 DIAGNOSIS — C169 Malignant neoplasm of stomach, unspecified: Secondary | ICD-10-CM | POA: Diagnosis not present

## 2023-09-15 DIAGNOSIS — E039 Hypothyroidism, unspecified: Secondary | ICD-10-CM | POA: Diagnosis not present

## 2023-09-15 DIAGNOSIS — E860 Dehydration: Secondary | ICD-10-CM | POA: Diagnosis not present

## 2023-09-15 DIAGNOSIS — C168 Malignant neoplasm of overlapping sites of stomach: Secondary | ICD-10-CM

## 2023-09-15 DIAGNOSIS — E785 Hyperlipidemia, unspecified: Secondary | ICD-10-CM | POA: Diagnosis not present

## 2023-09-15 DIAGNOSIS — Z5111 Encounter for antineoplastic chemotherapy: Secondary | ICD-10-CM | POA: Diagnosis not present

## 2023-09-15 MED ORDER — SODIUM CHLORIDE 0.9% FLUSH
10.0000 mL | INTRAVENOUS | Status: DC | PRN
  Administered 2023-09-15: 10 mL

## 2023-09-15 MED ORDER — SODIUM CHLORIDE 0.9 % IV SOLN
Freq: Once | INTRAVENOUS | Status: AC
Start: 2023-09-15 — End: 2023-09-15

## 2023-09-15 MED ORDER — HEPARIN SOD (PORK) LOCK FLUSH 100 UNIT/ML IV SOLN
500.0000 [IU] | Freq: Once | INTRAVENOUS | Status: AC | PRN
Start: 2023-09-15 — End: 2023-09-15
  Administered 2023-09-15: 500 [IU]

## 2023-09-15 NOTE — Patient Instructions (Signed)

## 2023-09-20 ENCOUNTER — Other Ambulatory Visit: Payer: Self-pay | Admitting: Hematology and Oncology

## 2023-09-20 ENCOUNTER — Inpatient Hospital Stay: Attending: Hematology and Oncology

## 2023-09-20 VITALS — BP 132/77 | HR 88 | Temp 98.2°F | Resp 18

## 2023-09-20 DIAGNOSIS — Z5111 Encounter for antineoplastic chemotherapy: Secondary | ICD-10-CM | POA: Insufficient documentation

## 2023-09-20 DIAGNOSIS — Z79899 Other long term (current) drug therapy: Secondary | ICD-10-CM | POA: Diagnosis not present

## 2023-09-20 DIAGNOSIS — C169 Malignant neoplasm of stomach, unspecified: Secondary | ICD-10-CM | POA: Diagnosis not present

## 2023-09-20 DIAGNOSIS — E860 Dehydration: Secondary | ICD-10-CM

## 2023-09-20 DIAGNOSIS — E876 Hypokalemia: Secondary | ICD-10-CM

## 2023-09-20 DIAGNOSIS — R7989 Other specified abnormal findings of blood chemistry: Secondary | ICD-10-CM

## 2023-09-20 MED ORDER — AZITHROMYCIN 250 MG PO TABS: ORAL_TABLET | ORAL | 0 refills | Status: DC

## 2023-09-20 MED ORDER — SODIUM CHLORIDE 0.9 % IV SOLN
Freq: Once | INTRAVENOUS | Status: AC
Start: 2023-09-20 — End: 2023-09-20

## 2023-09-20 NOTE — Patient Instructions (Signed)

## 2023-09-25 ENCOUNTER — Encounter: Payer: Self-pay | Admitting: Oncology

## 2023-09-27 ENCOUNTER — Ambulatory Visit

## 2023-09-27 ENCOUNTER — Inpatient Hospital Stay

## 2023-09-27 ENCOUNTER — Ambulatory Visit: Admitting: Oncology

## 2023-09-27 ENCOUNTER — Other Ambulatory Visit

## 2023-09-27 VITALS — BP 139/66 | HR 97 | Temp 99.0°F | Resp 18

## 2023-09-27 DIAGNOSIS — Z5111 Encounter for antineoplastic chemotherapy: Secondary | ICD-10-CM | POA: Diagnosis not present

## 2023-09-27 DIAGNOSIS — R7989 Other specified abnormal findings of blood chemistry: Secondary | ICD-10-CM

## 2023-09-27 DIAGNOSIS — E876 Hypokalemia: Secondary | ICD-10-CM

## 2023-09-27 DIAGNOSIS — Z79899 Other long term (current) drug therapy: Secondary | ICD-10-CM | POA: Diagnosis not present

## 2023-09-27 DIAGNOSIS — E860 Dehydration: Secondary | ICD-10-CM

## 2023-09-27 DIAGNOSIS — C169 Malignant neoplasm of stomach, unspecified: Secondary | ICD-10-CM | POA: Diagnosis not present

## 2023-09-27 MED ORDER — SODIUM CHLORIDE 0.9 % IV SOLN
Freq: Once | INTRAVENOUS | Status: AC
Start: 2023-09-27 — End: 2023-09-27

## 2023-09-27 NOTE — Patient Instructions (Signed)

## 2023-09-27 NOTE — Progress Notes (Signed)
 [facility]  [address] [place],  [place]  [address] [phone]  Clinic Day: 10/04/23  Referring physician: Jefferey Fitch, MD  ASSESSMENT & PLAN:  Assessment: Gastric cancer (HCC) Stage IVB (T4 N0 M1) poorly differentiated adenocarcinoma of the stomach with signet ring features and ulceration, metastatic to the omentum diagnosed in July 2023.  Stain for HER2 was negative.  PET scan revealed omental involvement. She received palliative chemotherapy with FOLFOX/nivolumab  (5-fluorouracil /leucovorin /oxaliplatin /nivolumab ). Oxaliplatin  was discontinued after 11 cycles, and she continues 5-fluorouracil /leucovorin /nivolumab .  PET in February 2024 revealed resolution of metabolic activity associated with the stomach.  Decrease in size of omental nodularity with no associated metabolic activity.   EGD in March revealed residual disease. CT imaging every few months has remained stable. She has had chronic kidney disease since developing immune mediated nephritis in February 2024, which resolved with high-dose steroids and holding nivolumab .  She receives extra IV fluids weekly to prevent recurrent acute kidney injury.   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   Her disease has remained stable with maintenance fluorouracil /leucovorin /nivolumab . CT chest, abdomen and pelvis in May of 2025 revealed persistent nonspecific diffuse gastric wall thickening with persistent subtle peritoneal nodularity inferior to the gastric body and antrum. Stable small solid pulmonary nodules in the left lung base measuring up to 6 mm, favored benign etiology. Following that, we changed her schedule from every 2 weeks to every 3 weeks.She was found to have Campylobacter in the stool after her 39th cycle of 5-fluorouracil /leucovorin /nivolumab  cycle.  She was treated with ciprofloxacin  with resolution of her diarrhea. I recommended she see Dr. Larene for consideration of repeat EGD. CT chest, abdomen, and  pelvis done on 09/29/2023 which revealed  redemonstration of irregular moderate thickening of the distal stomach body/pylorus, which is grossly similar to the prior study, there is associated mild perigastric fat stranding and multiple subcentimeter sized lymph nodes that are also grossly similar to the prior study from 06/30/2023 but progressed since the PET scan from 03/22/2022. There is no new metastatic disease identified within the chest, abdomen or pelvis and multiple other nonacute observations.   Drug-induced interstitial nephritis History immune mediated nephritis from immunotherapy in February, which resolved with prednisone .  She has had chronic kidney disease since that episode.  She will continue to get weekly IV fluids.  She knows to push oral fluids.     Acute deep vein thrombosis (DVT) of popliteal vein of right lower extremity (HCC) Occlusive deep venous thrombosis along the popliteal and peroneal vein diagnosed in March 2025. She was placed on apixaban  10 mg twice daily for 7 days, then apixaban  5 mg twice daily indefinitely.  Aspirin 81 mg daily was discontinued.  The patient states she continues apixaban  5 mg twice daily without difficulty.   Plan: She had a CT chest, abdomen, and pelvis done on 09/29/2023 which revealed  redemonstration of irregular moderate thickening of the distal stomach body/pylorus, which is grossly similar to the prior study, there is associated mild perigastric fat stranding and multiple subcentimeter sized lymph nodes that are also grossly similar to the prior study from 06/30/2023 but progressed since the PET scan from 03/22/2022. There is no new metastatic disease identified within the chest, abdomen or pelvis and multiple other nonacute observations. Her day 1 cycle 42 of FOLFOX and Nivolumab  is scheduled on 10/04/2023 and she will receive IV fluids once weekly. She had a WBC of 6.9, low hemoglobin of 11.4 down from 11.6, and platelet count of 201,000. Her  CMP  was fairly normal other than an elevated creatinine of 1.23 and a low total protein of 6.4. Her TSH level was normal at 1.615, T4 at 9.7, and T4, free at 1.11. She informed me that she has an appointment scheduled with Dr. Larene in mid September to arrange an EGD. [name] back in 3 weeks with CBC, CMP, TSH, and T4. The patient understands the plans discussed today and is in agreement with them.  She knows to contact our office if she develops concerns prior to her next appointment.  I provided 13 minutes of face-to-face time during this encounter and > 50% was spent counseling as documented under my assessment and plan.   Mackenzie VEAR Cornish, MD  [facility] A DEPT OF [facility] [address] [place] [address] Dept: [phone] Dept Fax: [phone]   No orders of the defined types were placed in this encounter.   CHIEF COMPLAINT:  CC: Stage IVB gastric cancer  Current Treatment: 5-fluorouracil /leucovorin /nivolumab   HISTORY OF PRESENT ILLNESS:   Oncology History  Gastric cancer (HCC)  09/10/2021 Initial Diagnosis   Gastric cancer (HCC)   09/10/2021 Cancer Staging   Staging form: Stomach, AJCC 8th Edition - Clinical stage from 09/10/2021: Stage IVB (cT4b, cN0, cM1) - Signed by Lewis Mackenzie VEAR, MD on 09/10/2021 Histopathologic type: Adenocarcinoma, NOS Stage prefix: Initial diagnosis Total positive nodes: 0 Histologic grade (G): G3 Histologic grading system: 3 grade system Sites of metastasis: Peritoneal surface Diagnostic confirmation: Positive histology PLUS positive immunophenotyping and/or positive genetic studies Specimen type: Endoscopy with Biopsy Staged by: Managing physician Carcinoembryonic antigen (CEA) (ng/mL): 2.8 Carbohydrate antigen 19-9 (CA 19-9) (U/mL): 4.9 HER2 status: Unknown Microsatellite instability (MSI): Unknown Tumor location in stomach: Other Clinical staging  modalities: Biopsy, Endoscopy Stage used in treatment planning: Yes National guidelines used in treatment planning: Yes Type of national guideline used in treatment planning: NCCN   09/28/2021 - 10/14/2021 Chemotherapy   Patient is on Treatment Plan : GASTROESOPHAGEAL FOLFOX + Nivolumab  q14d     09/28/2021 -  Chemotherapy   Patient is on Treatment Plan : GASTROESOPHAGEAL FOLFOX + Nivolumab  q21d (changed from q14d on 04/12/23)     12/09/2021 Genetic Testing   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  Report date is 12/09/2021.   The Multi-Cancer + RNA Panel offered by [facility] includes sequencing and/or deletion/duplication analysis of the following 84 genes:  AIP*, ALK, APC*, ATM*, AXIN2*, BAP1*, BARD1*, BLM*, BMPR1A*, BRCA1*, BRCA2*, BRIP1*, CASR, CDC73*, CDH1*, CDK4, CDKN1B*, CDKN1C*, CDKN2A, CEBPA, CHEK2*, CTNNA1*, DICER1*, DIS3L2*, EGFR, EPCAM, FH*, FLCN*, GATA2*, GPC3, GREM1, HOXB13, HRAS, KIT, MAX*, MEN1*, MET, MITF, MLH1*, MSH2*, MSH3*, MSH6*, MUTYH*, NBN*, NF1*, NF2*, NTHL1*, PALB2*, PDGFRA, PHOX2B, PMS2*, POLD1*, POLE*, POT1*, PRKAR1A*, PTCH1*, PTEN*, RAD50*, RAD51C*, RAD51D*, RB1*, RECQL4, RET, RUNX1*, SDHA*, SDHAF2*, SDHB*, SDHC*, SDHD*, SMAD4*, SMARCA4*, SMARCB1*, SMARCE1*, STK11*, SUFU*, TERC, TERT, TMEM127*, Tp53*, TSC1*, TSC2*, VHL*, WRN*, and WT1.  RNA analysis is performed for * genes.     INTERVAL HISTORY:  Mackenzie Lewis is here today for repeat clinical assessment for his stage IVB gastric cancer. Patient states that she feels well but complains of neuropathy of the feet and still a decreased appetite. She had a CT chest, abdomen, and pelvis done on 09/29/2023 which revealed  redemonstration of irregular moderate thickening of the distal stomach body/pylorus, which is grossly similar to the prior study, there is associated mild perigastric fat stranding and multiple subcentimeter sized lymph  nodes that are also grossly similar to the prior study  from 06/30/2023 but progressed since the PET scan from 03/22/2022. There is no new metastatic disease identified within the chest, abdomen or pelvis and multiple other nonacute observations. Her day 1 cycle 42 of FOLFOX and Nivolumab  is scheduled on 10/04/2023 and she will receive IV fluids once weekly. She had a WBC of 6.9, low hemoglobin of 11.4 down from 11.6, and platelet count of 201,000. Her CMP was fairly normal other than an elevated creatinine of 1.23 and a low total protein of 6.4. Her TSH level was normal at 1.615, T4 at 9.7, and T4, free at 1.11. She informed me that she has an appointment scheduled with Dr. Larene in mid September to arrange an EGD. [name] back in 3 weeks with CBC, CMP, TSH, and T4. She denies fever, chills, night sweats, or other signs of infection. She denies cardiorespiratory and gastrointestinal issues. Her appetite is still decreased but better and Her weight has increased 3 pounds over last 3 weeks.   REVIEW OF SYSTEMS:   Review of Systems  Constitutional:  Positive for appetite change (poor). Negative for chills, diaphoresis, fatigue, fever and unexpected weight change.  HENT:  Negative.  Negative for lump/mass, mouth sores and sore throat.   Eyes: Negative.   Respiratory: Negative.  Negative for chest tightness, cough, hemoptysis, shortness of breath and wheezing.   Cardiovascular: Negative.  Negative for chest pain, leg swelling and palpitations.  Gastrointestinal:  Positive for abdominal pain (discomfort) and nausea (intermittent, mild). Negative for abdominal distention, blood in stool, constipation, diarrhea and vomiting.  Endocrine: Negative.  Negative for hot flashes.  Genitourinary: Negative.  Negative for difficulty urinating, dysuria, frequency, hematuria and vaginal bleeding.   Musculoskeletal:  Positive for arthralgias (left shoulder) and gait problem (due to left foot and ankle pain). Negative for back pain, flank pain, myalgias and neck  pain.  Skin: Negative.  Negative for rash.  Neurological:  Positive for gait problem (due to left foot and ankle pain) and numbness (neuropathy feet). Negative for dizziness, extremity weakness, headaches, light-headedness, seizures and speech difficulty.  Hematological: Negative.  Negative for adenopathy. Does not bruise/bleed easily.  Psychiatric/Behavioral: Negative.  Negative for depression and sleep disturbance. The patient is not nervous/anxious.      VITALS:   Blood pressure (!) 142/74, pulse 93, temperature 97.8 F (36.6 C), temperature source Oral, resp. rate 16, height 5' 4 (1.626 m), weight 209 lb 3.2 oz (94.9 kg), SpO2 96%.  Wt Readings from Last 3 Encounters:  10/25/23 209 lb 12.8 oz (95.2 kg)  10/11/23 207 lb 8 oz (94.1 kg)  10/06/23 211 lb (95.7 kg)    Body mass index is 35.91 kg/m.  Performance status (ECOG): 1 - Symptomatic but completely ambulatory  PHYSICAL EXAM:  Physical Exam Vitals and nursing note reviewed.  Constitutional:      General: She is not in acute distress.    Appearance: Normal appearance.  HENT:     Head: Normocephalic and atraumatic.     Right Ear: Tympanic membrane, ear canal and external ear normal. There is no impacted cerumen.     Left Ear: Tympanic membrane, ear canal and external ear normal. There is no impacted cerumen.     Nose: Nose normal. No congestion or rhinorrhea.     Mouth/Throat:     Mouth: Mucous membranes are moist.     Pharynx: Oropharynx is clear. No oropharyngeal exudate or posterior oropharyngeal erythema.  Eyes:  General: No scleral icterus.    Extraocular Movements: Extraocular movements intact.     Conjunctiva/sclera: Conjunctivae normal.     Pupils: Pupils are equal, round, and reactive to light.  Cardiovascular:     Rate and Rhythm: Normal rate and regular rhythm.     Pulses: Normal pulses.     Heart sounds: Normal heart sounds. No murmur heard.    No friction rub. No gallop.  Pulmonary:     Effort:  Pulmonary effort is normal.     Breath sounds: Normal breath sounds. No wheezing, rhonchi or rales.  Abdominal:     General: Bowel sounds are normal. There is no distension.     Palpations: Abdomen is soft. There is no hepatomegaly, splenomegaly or mass.     Tenderness: There is abdominal tenderness in the epigastric area.     Comments: Mild firmness on deep palpation at the epigastrium  Musculoskeletal:        General: Normal range of motion.     Cervical back: Normal range of motion and neck supple. No tenderness.     Right lower leg: 1+ Edema present.     Left lower leg: 1+ Edema present.     Left ankle: No swelling. No tenderness.  Lymphadenopathy:     Cervical: No cervical adenopathy.     Upper Body:     Right upper body: No supraclavicular or axillary adenopathy.     Left upper body: No supraclavicular or axillary adenopathy.     Lower Body: No right inguinal adenopathy. No left inguinal adenopathy.  Skin:    General: Skin is warm and dry.     Coloration: Skin is not jaundiced.     Findings: No rash.  Neurological:     Mental Status: She is alert and oriented to person, place, and time.     Cranial Nerves: No cranial nerve deficit.  Psychiatric:        Mood and Affect: Mood normal.        Behavior: Behavior normal.        Thought Content: Thought content normal.     LABS:      Latest Ref Rng & Units 10/25/2023    8:59 AM 09/29/2023    2:03 PM 09/13/2023    8:47 AM  CBC  WBC 4.0 - 10.5 K/uL 5.9  6.9  5.3   Hemoglobin 12.0 - 15.0 g/dL 88.3  88.5  88.3   Hematocrit 36.0 - 46.0 % 34.9  34.6  35.4   Platelets 150 - 400 K/uL 226  201  181       Latest Ref Rng & Units 10/25/2023    8:59 AM 09/29/2023    2:02 PM 09/13/2023    8:47 AM  CMP  Glucose 70 - 99 mg/dL 882  864  811   BUN 8 - 23 mg/dL 24  20  22    Creatinine 0.44 - 1.00 mg/dL 8.72  8.76  8.70   Sodium 135 - 145 mmol/L 136  137  138   Potassium 3.5 - 5.1 mmol/L 4.0  4.2  3.8   Chloride 98 - 111 mmol/L 103  103   104   CO2 22 - 32 mmol/L 22  21  22    Calcium  8.9 - 10.3 mg/dL 9.4  9.5  9.3   Total Protein 6.5 - 8.1 g/dL 6.5  6.4  6.2   Total Bilirubin 0.0 - 1.2 mg/dL 0.4  0.6  0.5   Alkaline Phos 38 -  126 U/L 95  97  101   AST 15 - 41 U/L 17  21  24    ALT 0 - 44 U/L 21  20  23     Lab Results  Component Value Date   TSH 1.880 10/25/2023   T4TOTAL 8.4 10/25/2023   Lab Results  Component Value Date   CEA1 2.9 09/10/2021   /  CEA  Date Value Ref Range Status  09/10/2021 2.9 0.0 - 4.7 ng/mL Final    Comment:    (NOTE)                             Nonsmokers          <3.9                             Smokers             <5.6 [employer] Electrochemiluminescence Immunoassay (ECLIA) Values obtained with different assay methods or kits cannot be used interchangeably.  Results cannot be interpreted as absolute evidence of the presence or absence of malignant disease. Performed At: [facility] [address] [place], [place] [id number] Jennette Shorter MD Ey:[phone]     STUDIES:   CT CHEST ABDOMEN PELVIS W CONTRAST Result Date: 10/04/2023 CLINICAL DATA:  Gastric cancer, monitor. * Tracking Code: BO * EXAM: CT CHEST, ABDOMEN, AND PELVIS WITH CONTRAST TECHNIQUE: Multidetector CT imaging of the chest, abdomen and pelvis was performed following the standard protocol during bolus administration of intravenous contrast. RADIATION DOSE REDUCTION: This exam was performed according to the departmental dose-optimization program which includes automated exposure control, adjustment of the mA and/or kV according to patient size and/or use of iterative reconstruction technique. CONTRAST:  80mL OMNIPAQUE  IOHEXOL  300 MG/ML  SOLN COMPARISON:  CT scan chest, abdomen and pelvis from 06/30/2023. FINDINGS: CT CHEST FINDINGS Cardiovascular: Normal cardiac size. No pericardial effusion. No aortic aneurysm. There are mild peripheral atherosclerotic vascular calcifications of thoracic aorta and its major  branches. Mediastinum/Nodes: Visualized thyroid  gland appears grossly unremarkable. No solid / cystic mediastinal masses. The esophagus is nondistended precluding optimal assessment. There are few mildly prominent mediastinal lymph nodes, which do not meet the size criteria for lymphadenopathy and appear grossly similar to the prior study. There are few calcified mediastinal and right hilar lymph nodes, nonspecific but likely sequela of prior granulomatous infection. Otherwise, no axillary or hilar lymphadenopathy by size criteria. Lungs/Pleura: The central tracheo-bronchial tree is patent. Redemonstration of subpleural scarring/atelectasis mainly in the middle lobe and right lower lobe, essentially similar to the prior study. There are also bronchiectatic changes mainly in the right lung lower lobe. No mass or consolidation. No pleural effusion or pneumothorax. Redemonstration of subpleural noncalcified nodules in the left lung lower lobe with largest measuring up to 4 x 7 mm. No new or suspicious lung nodule. Musculoskeletal: A CT Port-a-Cath is seen in the right upper chest wall with the catheter terminating in the cavo-atrial junction region. Visualized soft tissues of the chest wall are otherwise grossly unremarkable. No suspicious osseous lesions. There are mild multilevel degenerative changes in the visualized spine. CT ABDOMEN PELVIS FINDINGS Hepatobiliary: The liver is normal in size. Non-cirrhotic configuration. No suspicious mass. These is mild diffuse hepatic steatosis. No intrahepatic or extrahepatic bile duct dilation. Gallbladder is partially distended. Redemonstration of irregular asymmetric thickening and hyperattenuation of the anterior wall of the gallbladder. There is small outpouching  in the fundus region, which may represent underlying Phrygian cap. No calcified gallstones. No pericholecystic fat stranding. Findings are of indeterminate etiology but unchanged since several prior studies and  favored benign in etiology. Pancreas: Unremarkable. No pancreatic ductal dilatation or surrounding inflammatory changes. Spleen: Within normal limits. No focal lesion. Adrenals/Urinary Tract: Adrenal glands are unremarkable. No suspicious renal mass. There are several subcentimeter sized hypoattenuating foci in bilateral kidneys, which are too small to adequately characterize. No hydronephrosis. No renal or ureteric calculi. Unremarkable urinary bladder. Stomach/Bowel: There is small sliding hiatal hernia. Redemonstration of irregular moderate thickening of the distal stomach body/pylorus, grossly similar to the prior study. There is associated mild perigastric fat stranding and multiple subcentimeter sized lymph nodes, grossly similar to the prior study from 06/30/2023 but progressed since the PET scan from 03/22/2022. These are concerning for locoregional metastatic disease. No disproportionate dilation of the small or large bowel loops. No evidence of abnormal bowel wall thickening or inflammatory changes. The appendix is surgically absent. Vascular/Lymphatic: No ascites or pneumoperitoneum. No new abdominal or pelvic lymphadenopathy, by size criteria. No aneurysmal dilation of the major abdominal arteries. There are moderate peripheral atherosclerotic vascular calcifications of the aorta and its major branches. Reproductive: The uterus is surgically absent. No large adnexal mass. Other: There is a tiny fat containing umbilical hernia. The soft tissues and abdominal wall are otherwise unremarkable. Musculoskeletal: No suspicious osseous lesions. There are mild multilevel degenerative changes in the visualized spine. IMPRESSION: 1. Redemonstration of irregular moderate thickening of the distal stomach body/pylorus, grossly similar to the prior study. There is associated mild perigastric fat stranding and multiple subcentimeter sized lymph nodes, grossly similar to the prior study from 06/30/2023 but progressed  since the PET scan from 03/22/2022. These are concerning for locoregional metastatic disease. 2. No new metastatic disease identified within the chest, abdomen or pelvis. 3. Multiple other nonacute observations, as described above. Aortic Atherosclerosis (ICD10-I70.0). Electronically Signed   By: Ree Molt M.D.   On: 10/04/2023 08:46    Exam: 06/30/2023 CT CHEST ABDOMEN PELVIS W CONTRAST  IMPRESSION: Persistent nonspecific diffuse gastric wall thickening with persistent subtle peritoneal nodularity inferior to the gastric body and antrum. The possibility of early peritoneal carcinomatosis cannot entirely be excluded. No convincing evidence for metastatic disease within the chest. Stable pulmonary nodules measuring up to 6 mm. Attention on follow-up.    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  If that happens, she is to call us  so that we can schedule her for an ultrasound to look for synovitis.     Essential hypertension 09/09/2016   Gastric cancer (HCC) 09/10/2021   Hyperlipidemia 09/01/2016   Hypertension    Hypothyroidism 09/01/2016   Osteoarthritis of both feet 01/20/2016   Osteoarthritis, hand 01/20/2016   Thyroid  disease     Past Surgical History:  Procedure Laterality Date   APPENDECTOMY     LAPAROSCOPIC APPENDECTOMY N/A 04/10/2016   Procedure: APPENDECTOMY LAPAROSCOPIC;  Surgeon: Vicenta Poli,  MD;  Location: [facility] OR;  Service: General;  Laterality: N/A;    Family History  Problem Relation Age of Onset   Hypertension Mother    Prostate cancer Father        metastatic; d. 25   Brain cancer Sister 45   Breast cancer Sister 16   AAA (abdominal aortic aneurysm) Brother    Leukemia Cousin        x2 maternal female cousins; d. before 77   Breast cancer Daughter 54       DCIS    Social History:  reports that she has never smoked. She has never used smokeless tobacco. She reports that she does not currently use alcohol . She reports that she does not use drugs.The patient is alone today.  Allergies:  Allergies  Allergen Reactions   Doxycycline Rash   Sulfa Antibiotics Other (See Comments) and Rash    Other reaction(s): Other (See Comments)  Made me feel weird    Current Medications: Current Outpatient Medications  Medication Sig Dispense Refill   b complex vitamins capsule Take 1 capsule by mouth daily.     Calcium  Carbonate (CALCIUM  600 PO) Take 1 tablet by mouth daily.     Cholecalciferol (VITAMIN D3) 5000 units CAPS Take 1 capsule by mouth daily.     ELIQUIS  5 MG TABS tablet TAKE 1 TABLET TWICE A DAY 180 tablet 3   famotidine (PEPCID) 40 MG tablet Take 40 mg by mouth at bedtime.     hydrochlorothiazide (HYDRODIURIL) 25 MG tablet Take 25 mg by mouth daily.     KRILL OIL PO Take 1 capsule by mouth daily. Unknown strenght     Lactobacillus TABS Take 1 tablet by mouth 2 (two) times daily.     levothyroxine  (SYNTHROID , LEVOTHROID) 75 MCG tablet Take 75 mcg by mouth daily before breakfast.      loratadine (CLARITIN) 10 MG tablet Take 10 mg by mouth daily.     LORazepam  (ATIVAN ) 0.5 MG tablet Take 1 tablet (0.5 mg total) by mouth every 8 (eight) hours. 30 tablet 0   NON FORMULARY Take 1 Dose by mouth See admin instructions. MMW: 3 parts Maalox 2 parts Benadryl  1 part viscious lidicaine  Disp. 6oz  Instructions: 5ml swish and swallow every 3-4 hours     omeprazole   (PRILOSEC) 40 MG capsule Take 1 capsule (40 mg total) by mouth 2 (two) times daily. 60 capsule 5   ondansetron  (ZOFRAN -ODT) 4 MG disintegrating tablet Take 1 tablet (4 mg total) by mouth every 8 (eight) hours as needed for nausea or vomiting. 90 tablet 3   polyethylene glycol powder (GLYCOLAX /MIRALAX ) 17 GM/SCOOP powder Take 1 Container by mouth daily.     potassium chloride  SA (KLOR-CON  M) 20 MEQ tablet TAKE 1 TABLET TWICE A DAY 180 tablet 3   pravastatin  (PRAVACHOL ) 20 MG tablet Take 1 tablet (20 mg total) by mouth every evening. 90 tablet 1   Probiotic Product (PROBIOTIC DAILY PO) Take 1 tablet by mouth daily.     prochlorperazine  (COMPAZINE ) 10 MG tablet Take 1 tablet (10 mg total) by mouth every 6 (six) hours as needed for nausea or vomiting. 90 tablet 3   valsartan (DIOVAN) 160 MG tablet Take 160 mg by mouth daily.     No current facility-administered medications for this visit.      I,Jasmine M Lassiter,acting as a scribe for Mackenzie VEAR Cornish, MD.,have documented all relevant documentation on the behalf of Mackenzie VEAR Cornish, MD,as  directed by  Mackenzie VEAR Cornish, MD while in the presence of Mackenzie VEAR Cornish, MD.

## 2023-09-29 ENCOUNTER — Encounter

## 2023-09-29 ENCOUNTER — Ambulatory Visit (HOSPITAL_BASED_OUTPATIENT_CLINIC_OR_DEPARTMENT_OTHER)
Admission: RE | Admit: 2023-09-29 | Discharge: 2023-09-29 | Disposition: A | Discharge status: Home / Self Care | Source: Ambulatory Visit | Attending: Oncology | Admitting: Oncology

## 2023-09-29 ENCOUNTER — Inpatient Hospital Stay

## 2023-09-29 DIAGNOSIS — Z79899 Other long term (current) drug therapy: Secondary | ICD-10-CM | POA: Diagnosis not present

## 2023-09-29 DIAGNOSIS — C168 Malignant neoplasm of overlapping sites of stomach: Secondary | ICD-10-CM

## 2023-09-29 DIAGNOSIS — K760 Fatty (change of) liver, not elsewhere classified: Secondary | ICD-10-CM | POA: Diagnosis not present

## 2023-09-29 DIAGNOSIS — I700 Atherosclerosis of aorta: Secondary | ICD-10-CM | POA: Diagnosis not present

## 2023-09-29 DIAGNOSIS — K828 Other specified diseases of gallbladder: Secondary | ICD-10-CM | POA: Diagnosis not present

## 2023-09-29 DIAGNOSIS — Z5111 Encounter for antineoplastic chemotherapy: Secondary | ICD-10-CM | POA: Diagnosis not present

## 2023-09-29 DIAGNOSIS — C169 Malignant neoplasm of stomach, unspecified: Secondary | ICD-10-CM | POA: Diagnosis not present

## 2023-09-29 LAB — CMP (CANCER CENTER ONLY)
ALT: 20 U/L (ref 0–44)
AST: 21 U/L (ref 15–41)
Albumin: 4.2 g/dL (ref 3.5–5.0)
Alkaline Phosphatase: 97 U/L (ref 38–126)
Anion gap: 12 (ref 5–15)
BUN: 20 mg/dL (ref 8–23)
CO2: 21 mmol/L — ABNORMAL LOW (ref 22–32)
Calcium: 9.5 mg/dL (ref 8.9–10.3)
Chloride: 103 mmol/L (ref 98–111)
Creatinine: 1.23 mg/dL — ABNORMAL HIGH (ref 0.44–1.00)
GFR, Estimated: 43 mL/min — ABNORMAL LOW (ref >60)
Glucose, Bld: 135 mg/dL — ABNORMAL HIGH (ref 70–99)
Potassium: 4.2 mmol/L (ref 3.5–5.1)
Sodium: 137 mmol/L (ref 135–145)
Total Bilirubin: 0.6 mg/dL (ref 0.0–1.2)
Total Protein: 6.4 g/dL — ABNORMAL LOW (ref 6.5–8.1)

## 2023-09-29 LAB — CBC WITH DIFFERENTIAL (CANCER CENTER ONLY)
Abs Immature Granulocytes: 0.02 K/uL (ref 0.00–0.07)
Basophils Absolute: 0 K/uL (ref 0.0–0.1)
Basophils Relative: 1 %
Eosinophils Absolute: 0.2 K/uL (ref 0.0–0.5)
Eosinophils Relative: 3 %
HCT: 34.6 % — ABNORMAL LOW (ref 36.0–46.0)
Hemoglobin: 11.4 g/dL — ABNORMAL LOW (ref 12.0–15.0)
Immature Granulocytes: 0 %
Lymphocytes Relative: 24 %
Lymphs Abs: 1.7 K/uL (ref 0.7–4.0)
MCH: 27.2 pg (ref 26.0–34.0)
MCHC: 32.9 g/dL (ref 30.0–36.0)
MCV: 82.6 fL (ref 80.0–100.0)
Monocytes Absolute: 0.7 K/uL (ref 0.1–1.0)
Monocytes Relative: 11 %
Neutro Abs: 4.3 K/uL (ref 1.7–7.7)
Neutrophils Relative %: 61 %
Platelet Count: 201 K/uL (ref 150–400)
RBC: 4.19 MIL/uL (ref 3.87–5.11)
RDW: 15.8 % — ABNORMAL HIGH (ref 11.5–15.5)
WBC Count: 6.9 K/uL (ref 4.0–10.5)
nRBC: 0 % (ref 0.0–0.2)

## 2023-09-29 LAB — TSH: TSH: 1.615 u[IU]/mL (ref 0.350–4.500)

## 2023-09-29 LAB — T4, FREE: Free T4: 1.11 ng/dL (ref 0.61–1.12)

## 2023-09-29 MED ORDER — IOHEXOL 300 MG/ML  SOLN
80.0000 mL | Freq: Once | INTRAMUSCULAR | Status: AC | PRN
  Administered 2023-09-29: 80 mL via INTRAVENOUS

## 2023-09-29 MED ORDER — HEPARIN SOD (PORK) LOCK FLUSH 100 UNIT/ML IV SOLN
500.0000 [IU] | Freq: Once | INTRAVENOUS | Status: AC
  Administered 2023-09-29: 500 [IU] via INTRAVENOUS

## 2023-09-30 LAB — T4: T4, Total: 9.7 ug/dL (ref 4.5–12.0)

## 2023-10-04 ENCOUNTER — Encounter: Payer: Self-pay | Admitting: Oncology

## 2023-10-04 ENCOUNTER — Inpatient Hospital Stay

## 2023-10-04 ENCOUNTER — Telehealth: Payer: Self-pay | Admitting: Oncology

## 2023-10-04 ENCOUNTER — Inpatient Hospital Stay (HOSPITAL_BASED_OUTPATIENT_CLINIC_OR_DEPARTMENT_OTHER): Admitting: Oncology

## 2023-10-04 ENCOUNTER — Ambulatory Visit

## 2023-10-04 ENCOUNTER — Other Ambulatory Visit

## 2023-10-04 ENCOUNTER — Other Ambulatory Visit: Payer: Self-pay | Admitting: Oncology

## 2023-10-04 VITALS — BP 142/74 | HR 93 | Temp 97.8°F | Resp 16 | Ht 64.0 in | Wt 209.2 lb

## 2023-10-04 DIAGNOSIS — Z5111 Encounter for antineoplastic chemotherapy: Secondary | ICD-10-CM | POA: Diagnosis not present

## 2023-10-04 DIAGNOSIS — C168 Malignant neoplasm of overlapping sites of stomach: Secondary | ICD-10-CM | POA: Diagnosis not present

## 2023-10-04 DIAGNOSIS — Z79899 Other long term (current) drug therapy: Secondary | ICD-10-CM | POA: Diagnosis not present

## 2023-10-04 DIAGNOSIS — C169 Malignant neoplasm of stomach, unspecified: Secondary | ICD-10-CM | POA: Diagnosis not present

## 2023-10-04 MED ORDER — SODIUM CHLORIDE 0.9 % IV SOLN
Freq: Once | INTRAVENOUS | Status: AC
Start: 2023-10-04 — End: 2023-10-04

## 2023-10-04 MED ORDER — PALONOSETRON HCL INJECTION 0.25 MG/5ML
0.2500 mg | Freq: Once | INTRAVENOUS | Status: AC
  Administered 2023-10-04: 0.25 mg via INTRAVENOUS
  Filled 2023-10-04: qty 5

## 2023-10-04 MED ORDER — SODIUM CHLORIDE 0.9 % IV SOLN
240.0000 mg | Freq: Once | INTRAVENOUS | Status: AC
  Administered 2023-10-04: 240 mg via INTRAVENOUS
  Filled 2023-10-04: qty 24

## 2023-10-04 MED ORDER — FLUOROURACIL CHEMO INJECTION 2.5 GM/50ML
320.0000 mg/m2 | Freq: Once | INTRAVENOUS | Status: AC
  Administered 2023-10-04: 650 mg via INTRAVENOUS
  Filled 2023-10-04: qty 13

## 2023-10-04 MED ORDER — SODIUM CHLORIDE 0.9 % IV SOLN
1960.0000 mg/m2 | INTRAVENOUS | Status: DC
  Administered 2023-10-04: 3850 mg via INTRAVENOUS
  Filled 2023-10-04: qty 77

## 2023-10-04 MED ORDER — LEUCOVORIN CALCIUM INJECTION 350 MG
320.0000 mg/m2 | Freq: Once | INTRAVENOUS | Status: AC
  Administered 2023-10-04: 630 mg via INTRAVENOUS
  Filled 2023-10-04: qty 31.5

## 2023-10-04 MED ORDER — DEXAMETHASONE SODIUM PHOSPHATE 10 MG/ML IJ SOLN
10.0000 mg | Freq: Once | INTRAMUSCULAR | Status: AC
  Administered 2023-10-04: 10 mg via INTRAVENOUS
  Filled 2023-10-04: qty 1

## 2023-10-04 NOTE — Telephone Encounter (Signed)
 Patient has been scheduled for follow-up visit per 10/04/23 LOS.  Pt given an appt calendar with date and time.

## 2023-10-04 NOTE — Patient Instructions (Signed)
Fluorouracil Injection What is this medication? FLUOROURACIL (flure oh YOOR a sil) treats some types of cancer. It works by slowing down the growth of cancer cells. This medicine may be used for other purposes; ask your health care provider or pharmacist if you have questions. COMMON BRAND NAME(S): Adrucil What should I tell my care team before I take this medication? They need to know if you have any of these conditions: Blood disorders Dihydropyrimidine dehydrogenase (DPD) deficiency Infection, such as chickenpox, cold sores, herpes Kidney disease Liver disease Poor nutrition Recent or ongoing radiation therapy An unusual or allergic reaction to fluorouracil, other medications, foods, dyes, or preservatives If you or your partner are pregnant or trying to get pregnant Breast-feeding How should I use this medication? This medication is injected into a vein. It is administered by your care team in a hospital or clinic setting. Talk to your care team about the use of this medication in children. Special care may be needed. Overdosage: If you think you have taken too much of this medicine contact a poison control center or emergency room at once. NOTE: This medicine is only for you. Do not share this medicine with others. What if I miss a dose? Keep appointments for follow-up doses. It is important not to miss your dose. Call your care team if you are unable to keep an appointment. What may interact with this medication? Do not take this medication with any of the following: Live virus vaccines This medication may also interact with the following: Medications that treat or prevent blood clots, such as warfarin, enoxaparin, dalteparin This list may not describe all possible interactions. Give your health care provider a list of all the medicines, herbs, non-prescription drugs, or dietary supplements you use. Also tell them if you smoke, drink alcohol, or use illegal drugs. Some items may  interact with your medicine. What should I watch for while using this medication? Your condition will be monitored carefully while you are receiving this medication. This medication may make you feel generally unwell. This is not uncommon as chemotherapy can affect healthy cells as well as cancer cells. Report any side effects. Continue your course of treatment even though you feel ill unless your care team tells you to stop. In some cases, you may be given additional medications to help with side effects. Follow all directions for their use. This medication may increase your risk of getting an infection. Call your care team for advice if you get a fever, chills, sore throat, or other symptoms of a cold or flu. Do not treat yourself. Try to avoid being around people who are sick. This medication may increase your risk to bruise or bleed. Call your care team if you notice any unusual bleeding. Be careful brushing or flossing your teeth or using a toothpick because you may get an infection or bleed more easily. If you have any dental work done, tell your dentist you are receiving this medication. Avoid taking medications that contain aspirin, acetaminophen, ibuprofen, naproxen, or ketoprofen unless instructed by your care team. These medications may hide a fever. Do not treat diarrhea with over the counter products. Contact your care team if you have diarrhea that lasts more than 2 days or if it is severe and watery. This medication can make you more sensitive to the sun. Keep out of the sun. If you cannot avoid being in the sun, wear protective clothing and sunscreen. Do not use sun lamps, tanning beds, or tanning booths. Talk to   your care team if you or your partner wish to become pregnant or think you might be pregnant. This medication can cause serious birth defects if taken during pregnancy and for 3 months after the last dose. A reliable form of contraception is recommended while taking this  medication and for 3 months after the last dose. Talk to your care team about effective forms of contraception. Do not father a child while taking this medication and for 3 months after the last dose. Use a condom while having sex during this time period. Do not breastfeed while taking this medication. This medication may cause infertility. Talk to your care team if you are concerned about your fertility. What side effects may I notice from receiving this medication? Side effects that you should report to your care team as soon as possible: Allergic reactions--skin rash, itching, hives, swelling of the face, lips, tongue, or throat Heart attack--pain or tightness in the chest, shoulders, arms, or jaw, nausea, shortness of breath, cold or clammy skin, feeling faint or lightheaded Heart failure--shortness of breath, swelling of the ankles, feet, or hands, sudden weight gain, unusual weakness or fatigue Heart rhythm changes--fast or irregular heartbeat, dizziness, feeling faint or lightheaded, chest pain, trouble breathing High ammonia level--unusual weakness or fatigue, confusion, loss of appetite, nausea, vomiting, seizures Infection--fever, chills, cough, sore throat, wounds that don't heal, pain or trouble when passing urine, general feeling of discomfort or being unwell Low red blood cell level--unusual weakness or fatigue, dizziness, headache, trouble breathing Pain, tingling, or numbness in the hands or feet, muscle weakness, change in vision, confusion or trouble speaking, loss of balance or coordination, trouble walking, seizures Redness, swelling, and blistering of the skin over hands and feet Severe or prolonged diarrhea Unusual bruising or bleeding Side effects that usually do not require medical attention (report to your care team if they continue or are bothersome): Dry skin Headache Increased tears Nausea Pain, redness, or swelling with sores inside the mouth or throat Sensitivity  to light Vomiting This list may not describe all possible side effects. Call your doctor for medical advice about side effects. You may report side effects to FDA at 1-800-FDA-1088. Where should I keep my medication? This medication is given in a hospital or clinic. It will not be stored at home. NOTE: This sheet is a summary. It may not cover all possible information. If you have questions about this medicine, talk to your doctor, pharmacist, or health care provider.  2024 [employer] Standard (2021-06-09 00:00:00) Leucovorin Injection What is this medication? LEUCOVORIN (loo koe VOR in) prevents side effects from certain medications, such as methotrexate. It works by increasing folate levels. This helps protect healthy cells in your body. It may also be used to treat anemia caused by low levels of folate. It can also be used with fluorouracil, a type of chemotherapy, to treat colorectal cancer. It works by increasing the effects of fluorouracil in the body. This medicine may be used for other purposes; ask your health care provider or pharmacist if you have questions. What should I tell my care team before I take this medication? They need to know if you have any of these conditions: Anemia from low levels of vitamin B12 in the blood An unusual or allergic reaction to leucovorin, folic acid, other medications, foods, dyes, or preservatives Pregnant or trying to get pregnant Breastfeeding How should I use this medication? This medication is injected into a vein or a muscle. It is given by your care team   in a hospital or clinic setting. Talk to your care team about the use of this medication in children. Special care may be needed. Overdosage: If you think you have taken too much of this medicine contact a poison control center or emergency room at once. NOTE: This medicine is only for you. Do not share this medicine with others. What if I miss a dose? Keep appointments for follow-up doses.  It is important not to miss your dose. Call your care team if you are unable to keep an appointment. What may interact with this medication? Capecitabine Fluorouracil Phenobarbital Phenytoin Primidone Trimethoprim;sulfamethoxazole This list may not describe all possible interactions. Give your health care provider a list of all the medicines, herbs, non-prescription drugs, or dietary supplements you use. Also tell them if you smoke, drink alcohol, or use illegal drugs. Some items may interact with your medicine. What should I watch for while using this medication? Your condition will be monitored carefully while you are receiving this medication. This medication may increase the side effects of 5-fluorouracil. Tell your care team if you have diarrhea or mouth sores that do not get better or that get worse. What side effects may I notice from receiving this medication? Side effects that you should report to your care team as soon as possible: Allergic reactions--skin rash, itching, hives, swelling of the face, lips, tongue, or throat This list may not describe all possible side effects. Call your doctor for medical advice about side effects. You may report side effects to FDA at 1-800-FDA-1088. Where should I keep my medication? This medication is given in a hospital or clinic. It will not be stored at home. NOTE: This sheet is a summary. It may not cover all possible information. If you have questions about this medicine, talk to your doctor, pharmacist, or health care provider.  2024 [employer] Standard (2021-07-07 00:00:00) Nivolumab Injection What is this medication? NIVOLUMAB (nye VOL ue mab) treats some types of cancer. It works by helping your immune system slow or stop the spread of cancer cells. It is a monoclonal antibody. This medicine may be used for other purposes; ask your health care provider or pharmacist if you have questions. COMMON BRAND NAME(S): Opdivo What should I  tell my care team before I take this medication? They need to know if you have any of these conditions: Allogeneic stem cell transplant (uses someone else's stem cells) Autoimmune diseases, such as Crohn disease, ulcerative colitis, lupus History of chest radiation Nervous system problems, such as Guillain-Barre syndrome or myasthenia gravis Organ transplant An unusual or allergic reaction to nivolumab, other medications, foods, dyes, or preservatives Pregnant or trying to get pregnant Breast-feeding How should I use this medication? This medication is infused into a vein. It is given in a hospital or clinic setting. A special MedGuide will be given to you before each treatment. Be sure to read this information carefully each time. Talk to your care team about the use of this medication in children. While it may be prescribed for children as young as 12 years for selected conditions, precautions do apply. Overdosage: If you think you have taken too much of this medicine contact a poison control center or emergency room at once. NOTE: This medicine is only for you. Do not share this medicine with others. What if I miss a dose? Keep appointments for follow-up doses. It is important not to miss your dose. Call your care team if you are unable to keep an appointment. What may   interact with this medication? Interactions have not been studied. This list may not describe all possible interactions. Give your health care provider a list of all the medicines, herbs, non-prescription drugs, or dietary supplements you use. Also tell them if you smoke, drink alcohol, or use illegal drugs. Some items may interact with your medicine. What should I watch for while using this medication? Your condition will be monitored carefully while you are receiving this medication. You may need blood work while taking this medication. This medication may cause serious skin reactions. They can happen weeks to months after  starting the medication. Contact your care team right away if you notice fevers or flu-like symptoms with a rash. The rash may be red or purple and then turn into blisters or peeling of the skin. You may also notice a red rash with swelling of the face, lips, or lymph nodes in your neck or under your arms. Tell your care team right away if you have any change in your eyesight. Talk to your care team if you are pregnant or think you might be pregnant. A negative pregnancy test is required before starting this medication. A reliable form of contraception is recommended while taking this medication and for 5 months after the last dose. Talk to your care team about effective forms of contraception. Do not breast-feed while taking this medication and for 5 months after the last dose. What side effects may I notice from receiving this medication? Side effects that you should report to your care team as soon as possible: Allergic reactions--skin rash, itching, hives, swelling of the face, lips, tongue, or throat Dry cough, shortness of breath or trouble breathing Eye pain, redness, irritation, or discharge with blurry or decreased vision Heart muscle inflammation--unusual weakness or fatigue, shortness of breath, chest pain, fast or irregular heartbeat, dizziness, swelling of the ankles, feet, or hands Hormone gland problems--headache, sensitivity to light, unusual weakness or fatigue, dizziness, fast or irregular heartbeat, increased sensitivity to cold or heat, excessive sweating, constipation, hair loss, increased thirst or amount of urine, tremors or shaking, irritability Infusion reactions--chest pain, shortness of breath or trouble breathing, feeling faint or lightheaded Kidney injury (glomerulonephritis)--decrease in the amount of urine, red or dark brown urine, foamy or bubbly urine, swelling of the ankles, hands, or feet Liver injury--right upper belly pain, loss of appetite, nausea, light-colored  stool, dark yellow or brown urine, yellowing skin or eyes, unusual weakness or fatigue Pain, tingling, or numbness in the hands or feet, muscle weakness, change in vision, confusion or trouble speaking, loss of balance or coordination, trouble walking, seizures Rash, fever, and swollen lymph nodes Redness, blistering, peeling, or loosening of the skin, including inside the mouth Sudden or severe stomach pain, bloody diarrhea, fever, nausea, vomiting Side effects that usually do not require medical attention (report these to your care team if they continue or are bothersome): Bone, joint, or muscle pain Diarrhea Fatigue Loss of appetite Nausea Skin rash This list may not describe all possible side effects. Call your doctor for medical advice about side effects. You may report side effects to FDA at 1-800-FDA-1088. Where should I keep my medication? This medication is given in a hospital or clinic. It will not be stored at home. NOTE: This sheet is a summary. It may not cover all possible information. If you have questions about this medicine, talk to your doctor, pharmacist, or health care provider.  2024 [employer] Standard (2021-06-01 00:00:00)  

## 2023-10-05 ENCOUNTER — Other Ambulatory Visit: Payer: Self-pay

## 2023-10-06 ENCOUNTER — Other Ambulatory Visit: Payer: Self-pay | Admitting: Hematology and Oncology

## 2023-10-06 ENCOUNTER — Inpatient Hospital Stay

## 2023-10-06 VITALS — BP 139/64 | HR 88 | Temp 98.1°F | Resp 18 | Ht 64.0 in | Wt 211.0 lb

## 2023-10-06 DIAGNOSIS — Z79899 Other long term (current) drug therapy: Secondary | ICD-10-CM | POA: Diagnosis not present

## 2023-10-06 DIAGNOSIS — I82431 Acute embolism and thrombosis of right popliteal vein: Secondary | ICD-10-CM

## 2023-10-06 DIAGNOSIS — C169 Malignant neoplasm of stomach, unspecified: Secondary | ICD-10-CM | POA: Diagnosis not present

## 2023-10-06 DIAGNOSIS — C168 Malignant neoplasm of overlapping sites of stomach: Secondary | ICD-10-CM

## 2023-10-06 DIAGNOSIS — Z5111 Encounter for antineoplastic chemotherapy: Secondary | ICD-10-CM | POA: Diagnosis not present

## 2023-10-06 MED ORDER — SODIUM CHLORIDE 0.9 % IV SOLN
Freq: Once | INTRAVENOUS | Status: AC
Start: 2023-10-06 — End: 2023-10-06

## 2023-10-06 NOTE — Patient Instructions (Signed)
 Dehydration, Adult Dehydration is a condition in which there is not enough water or other fluids in the body. This happens when a person loses more fluids than they take in. Important organs cannot work right without the right amount of fluids. Any loss of fluids from the body can cause dehydration. Dehydration can be mild, worse, or very bad. It should be treated right away to keep it from getting very bad. What are the causes? Conditions that cause loss of water in the body. They include: Watery poop (diarrhea). Vomiting. Sweating a lot. Fever. Infection. Peeing (urinating) a lot. Not drinking enough fluids. Certain medicines, such as medicines that take extra fluid out of the body (diuretics). Lack of safe drinking water. Not being able to get enough water and food. What increases the risk? Having a long-term (chronic) illness that has not been treated the right way, such as: Diabetes. Heart disease. Kidney disease. Being 25 years of age or older. Having a disability. Living in a place that is high above the ground or sea (high in altitude). The thinner, drier air causes more fluid loss. Doing exercises that put stress on your body for a long time. Being active when in hot places. What are the signs or symptoms? Symptoms of dehydration depend on how bad it is. Mild or worse dehydration Thirst. Dry lips or dry mouth. Feeling dizzy or light-headed. Muscle cramps. Passing little pee or dark pee. Pee may be the color of tea. Headache. Very bad dehydration Changes in skin. Skin may: Be cold to the touch (clammy). Be blotchy or pale. Not go back to normal right after you pinch it and let it go. Little or no tears, pee, or sweat. Fast breathing. Low blood pressure. Weak pulse. Pulse that is more than 100 beats a minute when you are sitting still. Other changes, such as: Feeling very thirsty. Eyes that look hollow (sunken). Cold hands and feet. Being confused. Being very  tired (lethargic) or having trouble waking from sleep. Losing weight. Loss of consciousness. How is this treated? Treatment for this condition depends on how bad your dehydration is. Treatment should start right away. Do not wait until your condition gets very bad. Very bad dehydration is an emergency. You will need to go to a hospital. Mild or worse dehydration can be treated at home. You may be asked to: Drink more fluids. Drink an oral rehydration solution (ORS). This drink gives you the right amount of fluids, salts, and minerals (electrolytes). Very bad dehydration can be treated: With fluids through an IV tube. By correcting low levels of electrolytes in the body. By treating the problem that caused your dehydration. Follow these instructions at home: Oral rehydration solution If told by your doctor, drink an ORS: Make an ORS. Use instructions on the package. Start by drinking small amounts, about  cup (120 mL) every 5-10 minutes. Slowly drink more until you have had the amount that your doctor said to have.  Eating and drinking  Drink enough clear fluid to keep your pee pale yellow. If you were told to drink an ORS, finish the ORS first. Then, start slowly drinking other clear fluids. Drink fluids such as: Water. Do not drink only water. Doing that can make the salt (sodium) level in your body get too low. Water from ice chips you suck on. Fruit juice that you have added water to (diluted). Low-calorie sports drinks. Eat foods that have the right amounts of salts and minerals, such as bananas, oranges, potatoes,  tomatoes, or spinach. Do not drink alcohol. Avoid drinks that have caffeine or sugar. These include:: High-calorie sports drinks. Fruit juice that you did not add water to. Soda. Coffee or energy drinks. Avoid foods that are greasy or have a lot of fat or sugar. General instructions Take over-the-counter and prescription medicines only as told by your doctor. Do  not take sodium tablets. Doing that can make the salt level in your body get too high. Return to your normal activities as told by your doctor. Ask your doctor what activities are safe for you. Keep all follow-up visits. Your doctor may check and change your treatment. Contact a doctor if: You have pain in your belly (abdomen) and the pain: Gets worse. Stays in one place. You have a rash. You have a stiff neck. You get angry or annoyed more easily than normal. You are more tired or have a harder time waking than normal. You feel weak or dizzy. You feel very thirsty. Get help right away if: You have any symptoms of very bad dehydration. You vomit every time you eat or drink. Your vomiting gets worse, does not go away, or you vomit blood or green stuff. You are getting treatment, but symptoms are getting worse. You have a fever. You have a very bad headache. You have: Diarrhea that gets worse or does not go away. Blood in your poop (stool). This may cause poop to look black and tarry. No pee in 6-8 hours. Only a small amount of pee in 6-8 hours, and the pee is very dark. You have trouble breathing. These symptoms may be an emergency. Get help right away. Call 911. Do not wait to see if the symptoms will go away. Do not drive yourself to the hospital. This information is not intended to replace advice given to you by your health care provider. Make sure you discuss any questions you have with your health care provider. Document Revised: 08/31/2021 Document Reviewed: 08/31/2021 Elsevier Patient Education  2024 [employer].

## 2023-10-07 ENCOUNTER — Encounter: Payer: Self-pay | Admitting: Oncology

## 2023-10-11 ENCOUNTER — Inpatient Hospital Stay

## 2023-10-11 VITALS — BP 149/69 | HR 91 | Temp 98.5°F | Resp 18 | Ht 64.0 in | Wt 207.5 lb

## 2023-10-11 DIAGNOSIS — E876 Hypokalemia: Secondary | ICD-10-CM

## 2023-10-11 DIAGNOSIS — E860 Dehydration: Secondary | ICD-10-CM

## 2023-10-11 DIAGNOSIS — R7989 Other specified abnormal findings of blood chemistry: Secondary | ICD-10-CM

## 2023-10-11 DIAGNOSIS — Z5111 Encounter for antineoplastic chemotherapy: Secondary | ICD-10-CM | POA: Diagnosis not present

## 2023-10-11 DIAGNOSIS — Z79899 Other long term (current) drug therapy: Secondary | ICD-10-CM | POA: Diagnosis not present

## 2023-10-11 DIAGNOSIS — C169 Malignant neoplasm of stomach, unspecified: Secondary | ICD-10-CM | POA: Diagnosis not present

## 2023-10-11 MED ORDER — SODIUM CHLORIDE 0.9 % IV SOLN: Freq: Once | INTRAVENOUS | Status: AC

## 2023-10-11 NOTE — Patient Instructions (Signed)

## 2023-10-18 ENCOUNTER — Inpatient Hospital Stay: Attending: Hematology and Oncology

## 2023-10-18 VITALS — BP 143/70 | HR 94 | Temp 98.5°F | Resp 18

## 2023-10-18 DIAGNOSIS — Z7982 Long term (current) use of aspirin: Secondary | ICD-10-CM | POA: Diagnosis not present

## 2023-10-18 DIAGNOSIS — I129 Hypertensive chronic kidney disease with stage 1 through stage 4 chronic kidney disease, or unspecified chronic kidney disease: Secondary | ICD-10-CM | POA: Diagnosis not present

## 2023-10-18 DIAGNOSIS — C169 Malignant neoplasm of stomach, unspecified: Secondary | ICD-10-CM | POA: Diagnosis not present

## 2023-10-18 DIAGNOSIS — Z79899 Other long term (current) drug therapy: Secondary | ICD-10-CM | POA: Insufficient documentation

## 2023-10-18 DIAGNOSIS — R7989 Other specified abnormal findings of blood chemistry: Secondary | ICD-10-CM

## 2023-10-18 DIAGNOSIS — Z7901 Long term (current) use of anticoagulants: Secondary | ICD-10-CM | POA: Insufficient documentation

## 2023-10-18 DIAGNOSIS — N182 Chronic kidney disease, stage 2 (mild): Secondary | ICD-10-CM | POA: Diagnosis not present

## 2023-10-18 DIAGNOSIS — Z86718 Personal history of other venous thrombosis and embolism: Secondary | ICD-10-CM | POA: Diagnosis not present

## 2023-10-18 DIAGNOSIS — Z5111 Encounter for antineoplastic chemotherapy: Secondary | ICD-10-CM | POA: Insufficient documentation

## 2023-10-18 DIAGNOSIS — E860 Dehydration: Secondary | ICD-10-CM

## 2023-10-18 DIAGNOSIS — E876 Hypokalemia: Secondary | ICD-10-CM

## 2023-10-18 MED ORDER — SODIUM CHLORIDE 0.9 % IV SOLN: Freq: Once | INTRAVENOUS | Status: AC

## 2023-10-18 NOTE — Patient Instructions (Signed)

## 2023-10-19 ENCOUNTER — Ambulatory Visit (INDEPENDENT_AMBULATORY_CARE_PROVIDER_SITE_OTHER): Admitting: Podiatry

## 2023-10-19 DIAGNOSIS — M79675 Pain in left toe(s): Secondary | ICD-10-CM | POA: Diagnosis not present

## 2023-10-19 DIAGNOSIS — M79674 Pain in right toe(s): Secondary | ICD-10-CM

## 2023-10-19 DIAGNOSIS — B351 Tinea unguium: Secondary | ICD-10-CM

## 2023-10-19 NOTE — Progress Notes (Signed)
    Subjective:  Patient ID: Mackenzie Lewis, female    DOB: 09/10/1935,  MRN: [id number]  Mackenzie Lewis presents to clinic today for:  Chief Complaint  Patient presents with   RFC    RFC, no callous Not diabetic Eliquis    Patient notes nails are thick, discolored, elongated and painful in shoegear when trying to ambulate.  States her daughter has been trimming her toenails occasionally.  PCP is Jefferey Fitch, MD.  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  If that happens, she is to call us  so that we can schedule her for an ultrasound to look for synovitis.     Essential hypertension 09/09/2016   Gastric cancer (HCC) 09/10/2021   Hyperlipidemia 09/01/2016   Hypertension    Hypothyroidism 09/01/2016   Osteoarthritis of both feet 01/20/2016   Osteoarthritis, hand 01/20/2016   Thyroid  disease    Past Surgical History:  Procedure Laterality Date   APPENDECTOMY     LAPAROSCOPIC APPENDECTOMY N/A 04/10/2016   Procedure: APPENDECTOMY LAPAROSCOPIC;  Surgeon: Vicenta Poli, MD;  Location: [facility] OR;  Service: General;  Laterality: N/A;   Allergies  Allergen Reactions   Doxycycline Rash   Sulfa Antibiotics Other (See Comments) and Rash    Other reaction(s): Other (See Comments)  Made me feel weird    Review of Systems: Negative except as noted in the HPI.  Objective:  Mackenzie Lewis is a pleasant 88 y.o. female  in NAD. AAO x 3.  Vascular Examination: Capillary refill time is 3-5 seconds to toes bilateral. Palpable pedal pulses b/l LE. Digital hair present b/l.  Skin temperature gradient WNL b/l. No varicosities b/l. No cyanosis noted b/l.   Dermatological Examination: Pedal skin with normal turgor, texture and tone b/l. No open wounds. No interdigital macerations b/l. Toenails x10 are 3mm thick, discolored, dystrophic with subungual debris. There is pain with compression of the nail plates.  They are elongated x10  Assessment/Plan: 1. Pain due to onychomycosis of toenails of both feet     The mycotic toenails were sharply debrided x10 with sterile nail nippers and a power debriding burr to decrease bulk/thickness and length.  Recommended over-the-counter antifungal solution to the toenails once daily.  She can try the over-the-counter Fungi-Nail solution.  Return in about 3 months (around 01/18/2024) for RFC.   Awanda CHARM Imperial, DPM, FACFAS [facility]     [address] [place], [place] [address]                Office [phone]  Fax [phone]

## 2023-10-20 ENCOUNTER — Other Ambulatory Visit: Payer: Self-pay

## 2023-10-24 DIAGNOSIS — G4733 Obstructive sleep apnea (adult) (pediatric): Secondary | ICD-10-CM | POA: Diagnosis not present

## 2023-10-25 ENCOUNTER — Inpatient Hospital Stay

## 2023-10-25 ENCOUNTER — Other Ambulatory Visit: Payer: Self-pay

## 2023-10-25 ENCOUNTER — Inpatient Hospital Stay (HOSPITAL_BASED_OUTPATIENT_CLINIC_OR_DEPARTMENT_OTHER): Admitting: Hematology and Oncology

## 2023-10-25 VITALS — BP 130/74 | HR 93 | Temp 98.2°F | Resp 16 | Ht 64.0 in | Wt 209.8 lb

## 2023-10-25 DIAGNOSIS — N182 Chronic kidney disease, stage 2 (mild): Secondary | ICD-10-CM | POA: Diagnosis not present

## 2023-10-25 DIAGNOSIS — C168 Malignant neoplasm of overlapping sites of stomach: Secondary | ICD-10-CM

## 2023-10-25 DIAGNOSIS — Z86718 Personal history of other venous thrombosis and embolism: Secondary | ICD-10-CM | POA: Diagnosis not present

## 2023-10-25 DIAGNOSIS — C169 Malignant neoplasm of stomach, unspecified: Secondary | ICD-10-CM | POA: Diagnosis not present

## 2023-10-25 DIAGNOSIS — Z7901 Long term (current) use of anticoagulants: Secondary | ICD-10-CM | POA: Diagnosis not present

## 2023-10-25 DIAGNOSIS — Z5111 Encounter for antineoplastic chemotherapy: Secondary | ICD-10-CM | POA: Diagnosis not present

## 2023-10-25 DIAGNOSIS — I129 Hypertensive chronic kidney disease with stage 1 through stage 4 chronic kidney disease, or unspecified chronic kidney disease: Secondary | ICD-10-CM | POA: Diagnosis not present

## 2023-10-25 LAB — CBC WITH DIFFERENTIAL (CANCER CENTER ONLY)
Abs Immature Granulocytes: 0.02 K/uL (ref 0.00–0.07)
Basophils Absolute: 0.1 K/uL (ref 0.0–0.1)
Basophils Relative: 1 %
Eosinophils Absolute: 0.2 K/uL (ref 0.0–0.5)
Eosinophils Relative: 4 %
HCT: 34.9 % — ABNORMAL LOW (ref 36.0–46.0)
Hemoglobin: 11.6 g/dL — ABNORMAL LOW (ref 12.0–15.0)
Immature Granulocytes: 0 %
Lymphocytes Relative: 35 %
Lymphs Abs: 2.1 K/uL (ref 0.7–4.0)
MCH: 27.2 pg (ref 26.0–34.0)
MCHC: 33.2 g/dL (ref 30.0–36.0)
MCV: 81.9 fL (ref 80.0–100.0)
Monocytes Absolute: 0.9 K/uL (ref 0.1–1.0)
Monocytes Relative: 16 %
Neutro Abs: 2.6 K/uL (ref 1.7–7.7)
Neutrophils Relative %: 44 %
Platelet Count: 226 K/uL (ref 150–400)
RBC: 4.26 MIL/uL (ref 3.87–5.11)
RDW: 15.6 % — ABNORMAL HIGH (ref 11.5–15.5)
WBC Count: 5.9 K/uL (ref 4.0–10.5)
nRBC: 0 % (ref 0.0–0.2)

## 2023-10-25 LAB — CMP (CANCER CENTER ONLY)
ALT: 21 U/L (ref 0–44)
AST: 17 U/L (ref 15–41)
Albumin: 4.3 g/dL (ref 3.5–5.0)
Alkaline Phosphatase: 95 U/L (ref 38–126)
Anion gap: 12 (ref 5–15)
BUN: 24 mg/dL — ABNORMAL HIGH (ref 8–23)
CO2: 22 mmol/L (ref 22–32)
Calcium: 9.4 mg/dL (ref 8.9–10.3)
Chloride: 103 mmol/L (ref 98–111)
Creatinine: 1.27 mg/dL — ABNORMAL HIGH (ref 0.44–1.00)
GFR, Estimated: 41 mL/min — ABNORMAL LOW (ref >60)
Glucose, Bld: 117 mg/dL — ABNORMAL HIGH (ref 70–99)
Potassium: 4 mmol/L (ref 3.5–5.1)
Sodium: 136 mmol/L (ref 135–145)
Total Bilirubin: 0.4 mg/dL (ref 0.0–1.2)
Total Protein: 6.5 g/dL (ref 6.5–8.1)

## 2023-10-25 LAB — TSH: TSH: 1.88 u[IU]/mL (ref 0.350–4.500)

## 2023-10-25 MED ORDER — SODIUM CHLORIDE 0.9 % IV SOLN: Freq: Once | INTRAVENOUS | Status: AC

## 2023-10-25 MED ORDER — DEXAMETHASONE SODIUM PHOSPHATE 10 MG/ML IJ SOLN
10.0000 mg | Freq: Once | INTRAMUSCULAR | Status: AC
  Administered 2023-10-25: 10 mg via INTRAVENOUS
  Filled 2023-10-25: qty 1

## 2023-10-25 MED ORDER — PALONOSETRON HCL INJECTION 0.25 MG/5ML
0.2500 mg | Freq: Once | INTRAVENOUS | Status: AC
  Administered 2023-10-25: 0.25 mg via INTRAVENOUS
  Filled 2023-10-25: qty 5

## 2023-10-25 MED ORDER — SODIUM CHLORIDE 0.9 % IV SOLN
240.0000 mg | Freq: Once | INTRAVENOUS | Status: AC
  Administered 2023-10-25: 240 mg via INTRAVENOUS
  Filled 2023-10-25: qty 24

## 2023-10-25 MED ORDER — FLUOROURACIL CHEMO INJECTION 2.5 GM/50ML
320.0000 mg/m2 | Freq: Once | INTRAVENOUS | Status: AC
  Administered 2023-10-25: 650 mg via INTRAVENOUS
  Filled 2023-10-25: qty 13

## 2023-10-25 MED ORDER — SODIUM CHLORIDE 0.9% FLUSH: 10.0000 mL | INTRAVENOUS | Status: DC | PRN

## 2023-10-25 MED ORDER — SODIUM CHLORIDE 0.9 % IV SOLN
1960.0000 mg/m2 | INTRAVENOUS | Status: DC
  Administered 2023-10-25: 3850 mg via INTRAVENOUS
  Filled 2023-10-25: qty 77

## 2023-10-25 MED ORDER — LEUCOVORIN CALCIUM INJECTION 350 MG
320.0000 mg/m2 | Freq: Once | INTRAVENOUS | Status: AC
  Administered 2023-10-25: 630 mg via INTRAVENOUS
  Filled 2023-10-25: qty 31.5

## 2023-10-25 NOTE — Patient Instructions (Signed)
The chemotherapy medication bag should finish at 46 hours. For example, if your pump is scheduled for 46 hours and it was put on at 4:00 p.m., it should finish at 2:00 p.m. the day it is scheduled to come off regardless of your appointment time.     Estimated time to finish at 1200.   If the display on your pump reads "Low Volume" and it is beeping, take the batteries out of the pump and come to the cancer center for it to be taken off.   If the pump alarms go off prior to the pump reading "Low Volume" then call [phone] and someone can assist you.  If the plunger comes out and the chemotherapy medication is leaking out, please use your home chemo spill kit to clean up the spill. Do NOT use paper towels or other household products.  If you have problems or questions regarding your pump, please call either [phone] (24 hours a day) or the cancer center Monday-Friday 8:00 a.m.- 4:30 p.m. at the clinic number and we will assist you. If you are unable to get assistance, then go to the nearest Emergency Department and ask the staff to contact the IV team for assistance.   

## 2023-10-25 NOTE — Progress Notes (Signed)
 [facility]  [address] 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  Stain for HER2 was negative.  PET scan revealed omental involvement. She received palliative chemotherapy with FOLFOX/nivolumab  (5-fluorouracil /leucovorin /oxaliplatin /nivolumab ). Oxaliplatin  was discontinued after 11 cycles, and she continues 5-fluorouracil /leucovorin /nivolumab .  PET in February 2024 revealed resolution of metabolic activity associated with the stomach.  Decrease in size of omental nodularity with no associated metabolic activity.   EGD in March revealed residual disease. CT imaging every few months has remained stable. She has had chronic kidney disease since developing immune mediated nephritis in February 2024, which resolved with high-dose steroids and holding nivolumab .  She receives extra IV fluids weekly to prevent recurrent acute kidney injury.   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   Her disease has remained stable with maintenance fluorouracil /leucovorin /nivolumab . CT chest, abdomen and pelvis in May of 2025 revealed persistent nonspecific diffuse gastric wall thickening with persistent subtle peritoneal nodularity inferior to the gastric body and antrum. Stable small solid pulmonary nodules in the left lung base measuring up to 6 mm, favored benign etiology. Following that, we changed her schedule from every 2 weeks to every 3 weeks.She was found to have Campylobacter in the stool after her 39th cycle of 5-fluorouracil /leucovorin /nivolumab  cycle.  She was treated with ciprofloxacin  with resolution of her diarrhea. I recommended she see Dr. Larene for consideration of repeat EGD. CT chest, abdomen,  and pelvis done on 09/29/2023 which revealed  redemonstration of irregular moderate thickening of the distal stomach body/pylorus, which is grossly similar to the prior study, there is associated mild perigastric fat stranding and multiple subcentimeter sized lymph nodes that are also grossly similar to the prior study from 06/30/2023 but progressed since the PET scan from 03/22/2022. There is no new metastatic disease identified within the chest, abdomen or pelvis and multiple other nonacute observations.   Drug-induced interstitial nephritis History immune mediated nephritis from immunotherapy in February, which resolved with prednisone .  She has had chronic kidney disease since that episode.  She will continue to get weekly IV fluids.  She knows to push oral fluids.     Acute deep vein thrombosis (DVT) of popliteal vein of right lower extremity (HCC) Occlusive deep venous thrombosis along the popliteal and peroneal vein diagnosed in March 2025. She was placed on apixaban  10 mg twice daily for 7 days, then apixaban  5 mg twice daily indefinitely.  Aspirin 81 mg daily was discontinued.  The patient states she continues apixaban  5 mg twice daily without difficulty.   Plan: She had a CT chest, abdomen, and pelvis done on 09/29/2023 which revealed  redemonstration of irregular moderate thickening of the distal stomach body/pylorus, which is grossly similar to the prior study, there is associated mild perigastric fat stranding and multiple subcentimeter sized lymph nodes that are also grossly similar to the prior study from 06/30/2023 but progressed since the PET scan from 03/22/2022. There is no new metastatic disease identified within the chest, abdomen or pelvis and multiple other nonacute observations.She informed me that she has an appointment scheduled with Dr. Larene in mid September to arrange an EGD. She will proceed with treatment today. [name] back in 3 weeks with CBC, CMP, TSH, and T4.  The patient understands the plans discussed today and is in agreement with them.  She knows to contact our office if she develops concerns prior to her next appointment.  I provided 20 minutes of face-to-face time during this encounter and > 50% was spent counseling as documented under my assessment and plan.   Eleanor Bach, FNP-BC [facility] [place] - A DEPT OF [facility]. [facility] [address] [place] [address] Dept: [phone] Dept Fax: [phone]   No orders of the defined types were placed in this encounter.   CHIEF COMPLAINT:  CC: Stage IVB gastric cancer  Current Treatment: 5-fluorouracil /leucovorin /nivolumab   HISTORY OF PRESENT ILLNESS:   Oncology History  Gastric cancer (HCC)  09/10/2021 Initial Diagnosis   Gastric cancer (HCC)   09/10/2021 Cancer Staging   Staging form: Stomach, AJCC 8th Edition - Clinical stage from 09/10/2021: Stage IVB (cT4b, cN0, cM1) - Signed by Cornelius Wanda VEAR, MD on 09/10/2021 Histopathologic type: Adenocarcinoma, NOS Stage prefix: Initial diagnosis Total positive nodes: 0 Histologic grade (G): G3 Histologic grading system: 3 grade system Sites of metastasis: Peritoneal surface Diagnostic confirmation: Positive histology PLUS positive immunophenotyping and/or positive genetic studies Specimen type: Endoscopy with Biopsy Staged by: Managing physician Carcinoembryonic antigen (CEA) (ng/mL): 2.8 Carbohydrate antigen 19-9 (CA 19-9) (U/mL): 4.9 HER2 status: Unknown Microsatellite instability (MSI): Unknown Tumor location in stomach: Other Clinical staging modalities: Biopsy, Endoscopy Stage used in treatment planning: Yes National guidelines used in treatment planning: Yes Type of national guideline used in treatment planning: NCCN   09/28/2021 - 10/14/2021 Chemotherapy   Patient is on Treatment Plan : GASTROESOPHAGEAL FOLFOX + Nivolumab  q14d     09/28/2021 -  Chemotherapy   Patient is on  Treatment Plan : GASTROESOPHAGEAL FOLFOX + Nivolumab  q21d (changed from q14d on 04/12/23)     12/09/2021 Genetic Testing   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  Report date is 12/09/2021.   The Multi-Cancer + RNA Panel offered by [facility] includes sequencing and/or deletion/duplication analysis of the following 84 genes:  AIP*, ALK, APC*, ATM*, AXIN2*, BAP1*, BARD1*, BLM*, BMPR1A*, BRCA1*, BRCA2*, BRIP1*, CASR, CDC73*, CDH1*, CDK4, CDKN1B*, CDKN1C*, CDKN2A, CEBPA, CHEK2*, CTNNA1*, DICER1*, DIS3L2*, EGFR, EPCAM, FH*, FLCN*, GATA2*, GPC3, GREM1, HOXB13, HRAS, KIT, MAX*, MEN1*, MET, MITF, MLH1*, MSH2*, MSH3*, MSH6*, MUTYH*, NBN*, NF1*, NF2*, NTHL1*, PALB2*, PDGFRA, PHOX2B, PMS2*, POLD1*, POLE*, POT1*, PRKAR1A*, PTCH1*, PTEN*, RAD50*, RAD51C*, RAD51D*, RB1*, RECQL4, RET, RUNX1*, SDHA*, SDHAF2*, SDHB*, SDHC*, SDHD*, SMAD4*, SMARCA4*, SMARCB1*, SMARCE1*, STK11*, SUFU*, TERC, TERT, TMEM127*, Tp53*, TSC1*, TSC2*, VHL*, WRN*, and WT1.  RNA analysis is performed for * genes.     INTERVAL HISTORY:  Mackenzie Lewis is here today for repeat clinical assessment for his stage IVB gastric cancer. Patient states that she feels well but complains of neuropathy of the feet and still a decreased appetite. She had a CT chest, abdomen, and pelvis done on 09/29/2023 which revealed  redemonstration of irregular moderate thickening of the distal stomach body/pylorus, which is grossly similar to the prior study, there is associated mild perigastric fat stranding and multiple subcentimeter sized lymph nodes that are also grossly similar to the prior study from 06/30/2023 but progressed since the PET scan from 03/22/2022. There is no new metastatic disease identified within the chest, abdomen or pelvis and multiple other nonacute observations.  She will receive IV fluids once weekly. She informed me that she has an appointment scheduled with Dr. Larene in mid September to arrange an EGD. I  will see her back in 3 weeks with CBC, CMP, TSH, and T4.   She denies fever, chills, night sweats, or other signs of infection. She denies cardiorespiratory and gastrointestinal issues. Her appetite is still decreased but better and Her weight has increased 3 pounds over last 3 weeks.   REVIEW OF SYSTEMS:   Review of Systems  Constitutional:  Positive for appetite change (poor). Negative for chills, diaphoresis, fatigue, fever and unexpected weight change.  HENT:  Negative.  Negative for lump/mass, mouth sores and sore throat.   Eyes: Negative.   Respiratory: Negative.  Negative for chest tightness, cough, hemoptysis, shortness of breath and wheezing.   Cardiovascular: Negative.  Negative for chest pain, leg swelling and palpitations.  Gastrointestinal:  Positive for abdominal pain (discomfort) and nausea (intermittent, mild). Negative for abdominal distention, blood in stool, constipation, diarrhea and vomiting.  Endocrine: Negative.  Negative for hot flashes.  Genitourinary: Negative.  Negative for difficulty urinating, dysuria, frequency, hematuria and vaginal bleeding.   Musculoskeletal:  Positive for arthralgias (left shoulder) and gait problem (due to left foot and ankle pain). Negative for back pain, flank pain, myalgias and neck pain.  Skin: Negative.  Negative for rash.  Neurological:  Positive for gait problem (due to left foot and ankle pain) and numbness (neuropathy feet). Negative for dizziness, extremity weakness, headaches, light-headedness, seizures and speech difficulty.  Hematological: Negative.  Negative for adenopathy. Does not bruise/bleed easily.  Psychiatric/Behavioral: Negative.  Negative for depression and sleep disturbance. The patient is not nervous/anxious.      VITALS:   Blood pressure 130/74, pulse 93, temperature 98.2 F (36.8 C), temperature source Oral, resp. rate 16, height 5' 4 (1.626 m), weight 209 lb 12.8 oz (95.2 kg), SpO2 97%.  Wt Readings from Last 3  Encounters:  10/25/23 209 lb 12.8 oz (95.2 kg)  10/11/23 207 lb 8 oz (94.1 kg)  10/06/23 211 lb (95.7 kg)    Body mass index is 36.01 kg/m.  Performance status (ECOG): 1 - Symptomatic but completely ambulatory  PHYSICAL EXAM:  Physical Exam Vitals and nursing note reviewed.  Constitutional:      General: She is not in acute distress.    Appearance: Normal appearance.  HENT:     Head: Normocephalic and atraumatic.     Right Ear: Tympanic membrane, ear canal and external ear normal. There is no impacted cerumen.     Left Ear: Tympanic membrane, ear canal and external ear normal. There is no impacted cerumen.     Nose: Nose normal. No congestion or rhinorrhea.     Mouth/Throat:     Mouth: Mucous membranes are moist.     Pharynx: Oropharynx is clear. No oropharyngeal exudate or posterior oropharyngeal erythema.  Eyes:     General: No scleral icterus.    Extraocular Movements: Extraocular movements intact.     Conjunctiva/sclera: Conjunctivae normal.     Pupils: Pupils are equal, round, and reactive to light.  Cardiovascular:     Rate and Rhythm: Normal rate and regular rhythm.     Pulses: Normal pulses.     Heart sounds: Normal heart sounds. No murmur heard.    No friction rub. No gallop.  Pulmonary:     Effort: Pulmonary effort is normal.     Breath sounds: Normal breath sounds. No wheezing, rhonchi or rales.  Abdominal:     General: Bowel sounds are normal. There is no distension.     Palpations: Abdomen is soft. There is no hepatomegaly, splenomegaly or mass.  Tenderness: There is abdominal tenderness in the epigastric area.     Comments: Mild firmness on deep palpation at the epigastrium  Musculoskeletal:        General: Normal range of motion.     Cervical back: Normal range of motion and neck supple. No tenderness.     Right lower leg: 1+ Edema present.     Left lower leg: 1+ Edema present.     Left ankle: No swelling. No tenderness.  Lymphadenopathy:      Cervical: No cervical adenopathy.     Upper Body:     Right upper body: No supraclavicular or axillary adenopathy.     Left upper body: No supraclavicular or axillary adenopathy.     Lower Body: No right inguinal adenopathy. No left inguinal adenopathy.  Skin:    General: Skin is warm and dry.     Coloration: Skin is not jaundiced.     Findings: No rash.  Neurological:     Mental Status: She is alert and oriented to person, place, and time.     Cranial Nerves: No cranial nerve deficit.  Psychiatric:        Mood and Affect: Mood normal.        Behavior: Behavior normal.        Thought Content: Thought content normal.     LABS:      Latest Ref Rng & Units 10/25/2023    8:59 AM 09/29/2023    2:03 PM 09/13/2023    8:47 AM  CBC  WBC 4.0 - 10.5 K/uL 5.9  6.9  5.3   Hemoglobin 12.0 - 15.0 g/dL 88.3  88.5  88.3   Hematocrit 36.0 - 46.0 % 34.9  34.6  35.4   Platelets 150 - 400 K/uL 226  201  181       Latest Ref Rng & Units 10/25/2023    8:59 AM 09/29/2023    2:02 PM 09/13/2023    8:47 AM  CMP  Glucose 70 - 99 mg/dL 882  864  811   BUN 8 - 23 mg/dL 24  20  22    Creatinine 0.44 - 1.00 mg/dL 8.72  8.76  8.70   Sodium 135 - 145 mmol/L 136  137  138   Potassium 3.5 - 5.1 mmol/L 4.0  4.2  3.8   Chloride 98 - 111 mmol/L 103  103  104   CO2 22 - 32 mmol/L 22  21  22    Calcium  8.9 - 10.3 mg/dL 9.4  9.5  9.3   Total Protein 6.5 - 8.1 g/dL 6.5  6.4  6.2   Total Bilirubin 0.0 - 1.2 mg/dL 0.4  0.6  0.5   Alkaline Phos 38 - 126 U/L 95  97  101   AST 15 - 41 U/L 17  21  24    ALT 0 - 44 U/L 21  20  23     Lab Results  Component Value Date   TSH 1.615 09/29/2023   T4TOTAL 9.7 09/29/2023   Lab Results  Component Value Date   CEA1 2.9 09/10/2021   /  CEA  Date Value Ref Range Status  09/10/2021 2.9 0.0 - 4.7 ng/mL Final    Comment:    (NOTE)                             Nonsmokers          <3.9  Smokers             <5.6 [employer]  Electrochemiluminescence Immunoassay (ECLIA) Values obtained with different assay methods or kits cannot be used interchangeably.  Results cannot be interpreted as absolute evidence of the presence or absence of malignant disease. Performed At: [facility] [address] [place], [place] [id number] Jennette Shorter MD Ey:[phone]     STUDIES:   CT CHEST ABDOMEN PELVIS W CONTRAST Result Date: 10/04/2023 CLINICAL DATA:  Gastric cancer, monitor. * Tracking Code: BO * EXAM: CT CHEST, ABDOMEN, AND PELVIS WITH CONTRAST TECHNIQUE: Multidetector CT imaging of the chest, abdomen and pelvis was performed following the standard protocol during bolus administration of intravenous contrast. RADIATION DOSE REDUCTION: This exam was performed according to the departmental dose-optimization program which includes automated exposure control, adjustment of the mA and/or kV according to patient size and/or use of iterative reconstruction technique. CONTRAST:  80mL OMNIPAQUE  IOHEXOL  300 MG/ML  SOLN COMPARISON:  CT scan chest, abdomen and pelvis from 06/30/2023. FINDINGS: CT CHEST FINDINGS Cardiovascular: Normal cardiac size. No pericardial effusion. No aortic aneurysm. There are mild peripheral atherosclerotic vascular calcifications of thoracic aorta and its major branches. Mediastinum/Nodes: Visualized thyroid  gland appears grossly unremarkable. No solid / cystic mediastinal masses. The esophagus is nondistended precluding optimal assessment. There are few mildly prominent mediastinal lymph nodes, which do not meet the size criteria for lymphadenopathy and appear grossly similar to the prior study. There are few calcified mediastinal and right hilar lymph nodes, nonspecific but likely sequela of prior granulomatous infection. Otherwise, no axillary or hilar lymphadenopathy by size criteria. Lungs/Pleura: The central tracheo-bronchial tree is patent. Redemonstration of subpleural scarring/atelectasis mainly in  the middle lobe and right lower lobe, essentially similar to the prior study. There are also bronchiectatic changes mainly in the right lung lower lobe. No mass or consolidation. No pleural effusion or pneumothorax. Redemonstration of subpleural noncalcified nodules in the left lung lower lobe with largest measuring up to 4 x 7 mm. No new or suspicious lung nodule. Musculoskeletal: A CT Port-a-Cath is seen in the right upper chest wall with the catheter terminating in the cavo-atrial junction region. Visualized soft tissues of the chest wall are otherwise grossly unremarkable. No suspicious osseous lesions. There are mild multilevel degenerative changes in the visualized spine. CT ABDOMEN PELVIS FINDINGS Hepatobiliary: The liver is normal in size. Non-cirrhotic configuration. No suspicious mass. These is mild diffuse hepatic steatosis. No intrahepatic or extrahepatic bile duct dilation. Gallbladder is partially distended. Redemonstration of irregular asymmetric thickening and hyperattenuation of the anterior wall of the gallbladder. There is small outpouching in the fundus region, which may represent underlying Phrygian cap. No calcified gallstones. No pericholecystic fat stranding. Findings are of indeterminate etiology but unchanged since several prior studies and favored benign in etiology. Pancreas: Unremarkable. No pancreatic ductal dilatation or surrounding inflammatory changes. Spleen: Within normal limits. No focal lesion. Adrenals/Urinary Tract: Adrenal glands are unremarkable. No suspicious renal mass. There are several subcentimeter sized hypoattenuating foci in bilateral kidneys, which are too small to adequately characterize. No hydronephrosis. No renal or ureteric calculi. Unremarkable urinary bladder. Stomach/Bowel: There is small sliding hiatal hernia. Redemonstration of irregular moderate thickening of the distal stomach body/pylorus, grossly similar to the prior study. There is associated mild  perigastric fat stranding and multiple subcentimeter sized lymph nodes, grossly similar to the prior study from 06/30/2023 but progressed since the PET scan from 03/22/2022. These are concerning for locoregional metastatic disease. No disproportionate dilation of the small or large bowel  loops. No evidence of abnormal bowel wall thickening or inflammatory changes. The appendix is surgically absent. Vascular/Lymphatic: No ascites or pneumoperitoneum. No new abdominal or pelvic lymphadenopathy, by size criteria. No aneurysmal dilation of the major abdominal arteries. There are moderate peripheral atherosclerotic vascular calcifications of the aorta and its major branches. Reproductive: The uterus is surgically absent. No large adnexal mass. Other: There is a tiny fat containing umbilical hernia. The soft tissues and abdominal wall are otherwise unremarkable. Musculoskeletal: No suspicious osseous lesions. There are mild multilevel degenerative changes in the visualized spine. IMPRESSION: 1. Redemonstration of irregular moderate thickening of the distal stomach body/pylorus, grossly similar to the prior study. There is associated mild perigastric fat stranding and multiple subcentimeter sized lymph nodes, grossly similar to the prior study from 06/30/2023 but progressed since the PET scan from 03/22/2022. These are concerning for locoregional metastatic disease. 2. No new metastatic disease identified within the chest, abdomen or pelvis. 3. Multiple other nonacute observations, as described above. Aortic Atherosclerosis (ICD10-I70.0). Electronically Signed   By: Ree Molt M.D.   On: 10/04/2023 08:46    Exam: 06/30/2023 CT CHEST ABDOMEN PELVIS W CONTRAST  IMPRESSION: Persistent nonspecific diffuse gastric wall thickening with persistent subtle peritoneal nodularity inferior to the gastric body and antrum. The possibility of early peritoneal carcinomatosis cannot entirely be excluded. No convincing evidence  for metastatic disease within the chest. Stable pulmonary nodules measuring up to 6 mm. Attention on follow-up.    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  If that happens, she is to call us  so that we can schedule her for an ultrasound to look for synovitis.     Essential hypertension 09/09/2016   Gastric cancer (HCC) 09/10/2021   Hyperlipidemia 09/01/2016   Hypertension    Hypothyroidism 09/01/2016   Osteoarthritis of both feet 01/20/2016   Osteoarthritis, hand 01/20/2016   Thyroid  disease     Past Surgical History:  Procedure Laterality Date   APPENDECTOMY     LAPAROSCOPIC APPENDECTOMY N/A 04/10/2016   Procedure: APPENDECTOMY LAPAROSCOPIC;  Surgeon: Vicenta Poli, MD;  Location: [facility] OR;  Service: General;  Laterality: N/A;    Family History  Problem Relation Age of Onset   Hypertension Mother    Prostate cancer Father        metastatic; d. 33   Brain cancer Sister 76   Breast cancer Sister 24   AAA (abdominal aortic aneurysm) Brother    Leukemia Cousin        x2 maternal female cousins; d. before 10   Breast cancer Daughter 55       DCIS    Social History:  reports that she has never smoked. She has never used smokeless tobacco. She reports that she does not currently use alcohol . She reports that she does not use drugs.The patient is alone today.  Allergies:  Allergies  Allergen Reactions    Doxycycline Rash   Sulfa Antibiotics Other (See Comments) and Rash  Other reaction(s): Other (See Comments)  Made me feel weird    Current Medications: Current Outpatient Medications  Medication Sig Dispense Refill   b complex vitamins capsule Take 1 capsule by mouth daily.     Calcium  Carbonate (CALCIUM  600 PO) Take 1 tablet by mouth daily.     Cholecalciferol (VITAMIN D3) 5000 units CAPS Take 1 capsule by mouth daily.     ELIQUIS  5 MG TABS tablet TAKE 1 TABLET TWICE A DAY 180 tablet 3   famotidine (PEPCID) 40 MG tablet Take 40 mg by mouth at bedtime.     hydrochlorothiazide (HYDRODIURIL) 25 MG tablet Take 25 mg by mouth daily.     KRILL OIL PO Take 1 capsule by mouth daily. Unknown strenght     Lactobacillus TABS Take 1 tablet by mouth 2 (two) times daily.     levothyroxine  (SYNTHROID , LEVOTHROID) 75 MCG tablet Take 75 mcg by mouth daily before breakfast.      loratadine (CLARITIN) 10 MG tablet Take 10 mg by mouth daily.     LORazepam  (ATIVAN ) 0.5 MG tablet Take 1 tablet (0.5 mg total) by mouth every 8 (eight) hours. 30 tablet 0   NON FORMULARY Take 1 Dose by mouth See admin instructions. MMW: 3 parts Maalox 2 parts Benadryl  1 part viscious lidicaine  Disp. 6oz  Instructions: 5ml swish and swallow every 3-4 hours     omeprazole  (PRILOSEC) 40 MG capsule Take 1 capsule (40 mg total) by mouth 2 (two) times daily. 60 capsule 5   ondansetron  (ZOFRAN -ODT) 4 MG disintegrating tablet Take 1 tablet (4 mg total) by mouth every 8 (eight) hours as needed for nausea or vomiting. 90 tablet 3   polyethylene glycol powder (GLYCOLAX /MIRALAX ) 17 GM/SCOOP powder Take 1 Container by mouth daily.     potassium chloride  SA (KLOR-CON  M) 20 MEQ tablet TAKE 1 TABLET TWICE A DAY 180 tablet 3   pravastatin  (PRAVACHOL ) 20 MG tablet Take 1 tablet (20 mg total) by mouth every evening. 90 tablet 1   Probiotic Product (PROBIOTIC DAILY PO) Take 1 tablet by mouth daily.     prochlorperazine  (COMPAZINE ) 10 MG  tablet Take 1 tablet (10 mg total) by mouth every 6 (six) hours as needed for nausea or vomiting. 90 tablet 3   valsartan (DIOVAN) 160 MG tablet Take 160 mg by mouth daily.     No current facility-administered medications for this visit.   Facility-Administered Medications Ordered in Other Visits  Medication Dose Route Frequency Provider Last Rate Last Admin   fluorouracil  (ADRUCIL ) 3,850 mg in sodium chloride  0.9 % 73 mL chemo infusion  1,960 mg/m2 (Order-Specific) Intravenous 1 day or 1 dose Cornelius Wanda DEL, MD   Infusion Verify at 10/25/23 1314   sodium chloride  flush (NS) 0.9 % injection 10 mL  10 mL Intracatheter PRN Cornelius Wanda DEL, MD

## 2023-10-26 ENCOUNTER — Encounter: Payer: Self-pay | Admitting: Oncology

## 2023-10-26 LAB — T4: T4, Total: 8.4 ug/dL (ref 4.5–12.0)

## 2023-10-27 ENCOUNTER — Inpatient Hospital Stay

## 2023-10-27 VITALS — HR 94 | Temp 98.5°F | Resp 22

## 2023-10-27 DIAGNOSIS — Z86718 Personal history of other venous thrombosis and embolism: Secondary | ICD-10-CM | POA: Diagnosis not present

## 2023-10-27 DIAGNOSIS — N182 Chronic kidney disease, stage 2 (mild): Secondary | ICD-10-CM | POA: Diagnosis not present

## 2023-10-27 DIAGNOSIS — Z7901 Long term (current) use of anticoagulants: Secondary | ICD-10-CM | POA: Diagnosis not present

## 2023-10-27 DIAGNOSIS — I129 Hypertensive chronic kidney disease with stage 1 through stage 4 chronic kidney disease, or unspecified chronic kidney disease: Secondary | ICD-10-CM | POA: Diagnosis not present

## 2023-10-27 DIAGNOSIS — C169 Malignant neoplasm of stomach, unspecified: Secondary | ICD-10-CM | POA: Diagnosis not present

## 2023-10-27 DIAGNOSIS — Z5111 Encounter for antineoplastic chemotherapy: Secondary | ICD-10-CM | POA: Diagnosis not present

## 2023-10-27 DIAGNOSIS — C168 Malignant neoplasm of overlapping sites of stomach: Secondary | ICD-10-CM

## 2023-10-27 MED ORDER — SODIUM CHLORIDE 0.9 % IV SOLN: Freq: Once | INTRAVENOUS | Status: AC

## 2023-10-27 NOTE — Patient Instructions (Signed)

## 2023-11-01 ENCOUNTER — Inpatient Hospital Stay

## 2023-11-01 VITALS — BP 138/62 | HR 94 | Temp 98.3°F | Resp 20

## 2023-11-01 DIAGNOSIS — E876 Hypokalemia: Secondary | ICD-10-CM

## 2023-11-01 DIAGNOSIS — E860 Dehydration: Secondary | ICD-10-CM

## 2023-11-01 DIAGNOSIS — N182 Chronic kidney disease, stage 2 (mild): Secondary | ICD-10-CM | POA: Diagnosis not present

## 2023-11-01 DIAGNOSIS — C169 Malignant neoplasm of stomach, unspecified: Secondary | ICD-10-CM | POA: Diagnosis not present

## 2023-11-01 DIAGNOSIS — R7989 Other specified abnormal findings of blood chemistry: Secondary | ICD-10-CM

## 2023-11-01 DIAGNOSIS — Z7901 Long term (current) use of anticoagulants: Secondary | ICD-10-CM | POA: Diagnosis not present

## 2023-11-01 DIAGNOSIS — Z5111 Encounter for antineoplastic chemotherapy: Secondary | ICD-10-CM | POA: Diagnosis not present

## 2023-11-01 DIAGNOSIS — Z86718 Personal history of other venous thrombosis and embolism: Secondary | ICD-10-CM | POA: Diagnosis not present

## 2023-11-01 DIAGNOSIS — I129 Hypertensive chronic kidney disease with stage 1 through stage 4 chronic kidney disease, or unspecified chronic kidney disease: Secondary | ICD-10-CM | POA: Diagnosis not present

## 2023-11-01 MED ORDER — SODIUM CHLORIDE 0.9 % IV SOLN: Freq: Once | INTRAVENOUS | Status: AC

## 2023-11-01 NOTE — Patient Instructions (Signed)

## 2023-11-08 ENCOUNTER — Inpatient Hospital Stay

## 2023-11-08 VITALS — BP 148/72 | HR 113 | Temp 97.8°F | Resp 20

## 2023-11-08 DIAGNOSIS — E876 Hypokalemia: Secondary | ICD-10-CM

## 2023-11-08 DIAGNOSIS — Z5111 Encounter for antineoplastic chemotherapy: Secondary | ICD-10-CM | POA: Diagnosis not present

## 2023-11-08 DIAGNOSIS — Z86718 Personal history of other venous thrombosis and embolism: Secondary | ICD-10-CM | POA: Diagnosis not present

## 2023-11-08 DIAGNOSIS — E860 Dehydration: Secondary | ICD-10-CM

## 2023-11-08 DIAGNOSIS — C169 Malignant neoplasm of stomach, unspecified: Secondary | ICD-10-CM | POA: Diagnosis not present

## 2023-11-08 DIAGNOSIS — I129 Hypertensive chronic kidney disease with stage 1 through stage 4 chronic kidney disease, or unspecified chronic kidney disease: Secondary | ICD-10-CM | POA: Diagnosis not present

## 2023-11-08 DIAGNOSIS — Z7901 Long term (current) use of anticoagulants: Secondary | ICD-10-CM | POA: Diagnosis not present

## 2023-11-08 DIAGNOSIS — R7989 Other specified abnormal findings of blood chemistry: Secondary | ICD-10-CM

## 2023-11-08 DIAGNOSIS — N182 Chronic kidney disease, stage 2 (mild): Secondary | ICD-10-CM | POA: Diagnosis not present

## 2023-11-08 MED ORDER — SODIUM CHLORIDE 0.9 % IV SOLN: Freq: Once | INTRAVENOUS | Status: AC

## 2023-11-08 NOTE — Patient Instructions (Signed)
 Managing Chemotherapy Side Effects, Adult Chemotherapy is a treatment that uses medicine to kill cancer cells. However, in addition to killing cancer cells, the medicines can also damage healthy cells. The damage to healthy cells can lead to side effects. The exact side effects depend on the specific medicines used. Most of the side effects of chemotherapy go away once treatment is finished. Until then, work closely with your health care providers and take an active role in managing your side effects. What are common side effects of chemotherapy? Increased risk of infection, bruising, or bleeding. Nausea and vomiting. Constipation or diarrhea. Loss of appetite. Hair loss. Mouth or throat sores. Tiredness (fatigue). Tingling, pain, or numbness in the hands and feet. Dry, sensitive, itchy, or sore skin. Sleep disturbances, such as excessive sleepiness. Confusion, anxiety, or mood swings. Memory changes. How to manage the side effects of chemotherapy Medicines Take over-the-counter and prescription medicines only as told by your health care provider. Talk with your health care provider before taking vitamins, herbs, supplements, or over-the-counter medicines. Some of these can interfere with chemotherapy. Activity Get plenty of rest. Get regular exercise by doing activities such as walking, gentle yoga, or tai chi. Return to your normal activities as told by your health care provider. Ask your health care provider what activities are safe for you. Eating and drinking  Talk to a dietitian about what you should eat and drink during cancer treatment. Drink enough fluid to keep your urine pale yellow. If you have side effects that affect eating, these tips may help: Eat smaller meals and snacks often. Drink high-nutrition and high-calorie shakes or supplements. Choose bland and soft foods that are easy to eat. Do not eat foods that are hot, spicy, or hard to swallow. Do not eat raw or  undercooked meat, eggs, or seafood. Always wash fresh fruits and vegetables well before eating them. Skin care If you have sore or itchy skin: Wear soft, comfortable clothing. Apply creams and ointments to your skin as told by your health care provider. If you lose your hair, consider wearing a wig, hat, or scarf to cover your head. You may want to have someone shave your head as you start to lose hair. During outdoor activities, protect your head and skin from the sun by using sunscreen with an SPF of 30 or higher or by wearing protective clothing and a hat. Meet with a hair and skin care specialist for makeup and skin care tips. Apply sunscreen to your scalp as told by your health care provider. General tips Learn as much as you can about your condition. If you are struggling emotionally, talk with a mental health care provider or join a support group. Keep all follow-up visits. This is important. How to prevent infection and bleeding Chemotherapy may lower your blood counts and put you at risk for infection and bleeding. Here are some ways to help prevent problems. Vaccines Talk to your health care provider about vaccines. You should not get any live vaccines, such as the polio, MMR, chickenpox, and shingles vaccines until your health care provider says that it is safe to do so. Do not be around people who have had live vaccines for as long as your health care provider recommends. Make sure you get a yearly flu shot. People who will be near you should also get a yearly flu shot. Social activity Stay away from crowded places where you could be exposed to germs. Do not be around people who may be sick or  people who have symptoms of a fever until they have been fever-free for at least 24 hours. Do not share food, cups, straws, or utensils with other people. Wear a mask when outside the home if your blood counts are low. Cleanliness  Wash your hands often for at least 20 seconds. Also make  sure that other members of your household wash their hands often. Brush your teeth twice daily using a soft toothbrush. Use mouth rinse only as told by your health care provider. Take a bath or shower daily unless your health care provider gives different instructions. General tips Take your temperature regularly, especially if you have chills or feel warm. Check with your health care provider: Before you travel. Before you have a dental procedure. Before you use a swimming pool, hot tub, or swim in a lake or ocean. If you get chemotherapy through an IV or port, check the site every day for signs of infection. Check for redness, swelling, pain, fluid, and warmth. Avoid activities that put you at risk for injury or bleeding. Use an electric razor to shave instead of a blade. Questions to ask your health care provider What are the most common side effects of my treatment? How will they affect my daily life? What can I do to manage them? What are some possible long-term side effects? What are possible complications? What support services are available? What number can I call with questions or concerns? Where to find support Cancer affects the entire family. Find out what family support resources are available from your cancer treatment center. For more support, turn to: Your cancer care team. Friends and family. Your religious community. Other people with cancer. Community-based or online support groups. Where to find more information National Cancer Institute: www.cancer.gov American Cancer Society: www.cancer.org Contact a health care provider if: You bleed or bruise more often. You notice blood in your urine or stool. You have any of these symptoms: A skin rash, or dry or itchy skin. A headache or stiff neck. Cold or flu symptoms. A cough. Persistent nausea or vomiting. Persistent diarrhea. Frequent urination, burning when passing urine, or foul-smelling urine. You cannot eat  because of mouth or throat pain. You are sad, confused, anxious, or depressed. Get help right away if: You have any of these symptoms: A fever or chills. Your health care provider should know about this right away. Redness, swelling, pain, fluid, or warmth near an IV site. Bleeding that you cannot stop. A seizure. You cannot swallow. You have chest pain. You have trouble breathing. A family member or caregiver should get help right away if you have a sudden or unusual change in behavior. These symptoms may be an emergency. Get help right away. Call 911. Do not wait to see if the symptoms will go away. Do not drive yourself to the hospital. Summary Chemotherapy is a treatment that uses medicine to kill cancer cells and can cause side effects. The specific side effects depend on the specific medicines used. Learn as much as you can about your condition. Ask about side effects to watch for and how to treat them. Seek out support and resources from others. Find out what family support resources are available from your cancer treatment center. Let your health care provider know if you notice any new, unusual, or worsening symptoms, especially fever or chills. This information is not intended to replace advice given to you by your health care provider. Make sure you discuss any questions you have with your health  care provider. Document Revised: 01/22/2021 Document Reviewed: 01/22/2021 Elsevier Patient Education  2024 [employer].

## 2023-11-10 DIAGNOSIS — K219 Gastro-esophageal reflux disease without esophagitis: Secondary | ICD-10-CM | POA: Diagnosis not present

## 2023-11-15 ENCOUNTER — Inpatient Hospital Stay

## 2023-11-15 ENCOUNTER — Encounter: Payer: Self-pay | Admitting: Oncology

## 2023-11-15 ENCOUNTER — Other Ambulatory Visit: Payer: Self-pay | Admitting: Pharmacist

## 2023-11-15 ENCOUNTER — Other Ambulatory Visit: Payer: Self-pay | Admitting: Oncology

## 2023-11-15 ENCOUNTER — Inpatient Hospital Stay: Admitting: Oncology

## 2023-11-15 VITALS — BP 140/80 | HR 87 | Temp 97.7°F | Resp 16 | Ht 64.0 in | Wt 210.2 lb

## 2023-11-15 DIAGNOSIS — C168 Malignant neoplasm of overlapping sites of stomach: Secondary | ICD-10-CM

## 2023-11-15 DIAGNOSIS — I129 Hypertensive chronic kidney disease with stage 1 through stage 4 chronic kidney disease, or unspecified chronic kidney disease: Secondary | ICD-10-CM | POA: Diagnosis not present

## 2023-11-15 DIAGNOSIS — Z5111 Encounter for antineoplastic chemotherapy: Secondary | ICD-10-CM | POA: Diagnosis not present

## 2023-11-15 DIAGNOSIS — C169 Malignant neoplasm of stomach, unspecified: Secondary | ICD-10-CM | POA: Diagnosis not present

## 2023-11-15 DIAGNOSIS — Z86718 Personal history of other venous thrombosis and embolism: Secondary | ICD-10-CM | POA: Diagnosis not present

## 2023-11-15 DIAGNOSIS — Z7901 Long term (current) use of anticoagulants: Secondary | ICD-10-CM | POA: Diagnosis not present

## 2023-11-15 DIAGNOSIS — N182 Chronic kidney disease, stage 2 (mild): Secondary | ICD-10-CM | POA: Diagnosis not present

## 2023-11-15 LAB — CBC WITH DIFFERENTIAL (CANCER CENTER ONLY)
Abs Immature Granulocytes: 0.02 K/uL (ref 0.00–0.07)
Basophils Absolute: 0.1 K/uL (ref 0.0–0.1)
Basophils Relative: 1 %
Eosinophils Absolute: 0.2 K/uL (ref 0.0–0.5)
Eosinophils Relative: 4 %
HCT: 35.4 % — ABNORMAL LOW (ref 36.0–46.0)
Hemoglobin: 11.7 g/dL — ABNORMAL LOW (ref 12.0–15.0)
Immature Granulocytes: 0 %
Lymphocytes Relative: 35 %
Lymphs Abs: 2.2 K/uL (ref 0.7–4.0)
MCH: 27 pg (ref 26.0–34.0)
MCHC: 33.1 g/dL (ref 30.0–36.0)
MCV: 81.6 fL (ref 80.0–100.0)
Monocytes Absolute: 0.9 K/uL (ref 0.1–1.0)
Monocytes Relative: 14 %
Neutro Abs: 2.9 K/uL (ref 1.7–7.7)
Neutrophils Relative %: 46 %
Platelet Count: 233 K/uL (ref 150–400)
RBC: 4.34 MIL/uL (ref 3.87–5.11)
RDW: 15.9 % — ABNORMAL HIGH (ref 11.5–15.5)
WBC Count: 6.3 K/uL (ref 4.0–10.5)
nRBC: 0 % (ref 0.0–0.2)

## 2023-11-15 LAB — CMP (CANCER CENTER ONLY)
ALT: 17 U/L (ref 0–44)
AST: 18 U/L (ref 15–41)
Albumin: 3.8 g/dL (ref 3.5–5.0)
Alkaline Phosphatase: 98 U/L (ref 38–126)
Anion gap: 13 (ref 5–15)
BUN: 19 mg/dL (ref 8–23)
CO2: 22 mmol/L (ref 22–32)
Calcium: 9.3 mg/dL (ref 8.9–10.3)
Chloride: 102 mmol/L (ref 98–111)
Creatinine: 1.25 mg/dL — ABNORMAL HIGH (ref 0.44–1.00)
GFR, Estimated: 42 mL/min — ABNORMAL LOW (ref >60)
Glucose, Bld: 112 mg/dL — ABNORMAL HIGH (ref 70–99)
Potassium: 4 mmol/L (ref 3.5–5.1)
Sodium: 137 mmol/L (ref 135–145)
Total Bilirubin: 0.4 mg/dL (ref 0.0–1.2)
Total Protein: 6.4 g/dL — ABNORMAL LOW (ref 6.5–8.1)

## 2023-11-15 LAB — TSH: TSH: 2.65 u[IU]/mL (ref 0.350–4.500)

## 2023-11-15 MED ORDER — FLUOROURACIL CHEMO INJECTION 500 MG/10ML
256.0000 mg/m2 | Freq: Once | INTRAVENOUS | Status: AC
  Administered 2023-11-15: 500 mg via INTRAVENOUS
  Filled 2023-11-15: qty 10

## 2023-11-15 MED ORDER — DEXAMETHASONE SODIUM PHOSPHATE 10 MG/ML IJ SOLN
10.0000 mg | Freq: Once | INTRAMUSCULAR | Status: AC
  Administered 2023-11-15: 10 mg via INTRAVENOUS
  Filled 2023-11-15: qty 1

## 2023-11-15 MED ORDER — PROCHLORPERAZINE MALEATE 10 MG PO TABS: 10.0000 mg | ORAL_TABLET | Freq: Four times a day (QID) | ORAL | 3 refills | Status: DC | PRN

## 2023-11-15 MED ORDER — SODIUM CHLORIDE 0.9 % IV SOLN: Freq: Once | INTRAVENOUS | Status: AC

## 2023-11-15 MED ORDER — PALONOSETRON HCL INJECTION 0.25 MG/5ML
0.2500 mg | Freq: Once | INTRAVENOUS | Status: AC
  Administered 2023-11-15: 0.25 mg via INTRAVENOUS
  Filled 2023-11-15: qty 5

## 2023-11-15 MED ORDER — DEXTROSE 5 % IV SOLN: Freq: Once | INTRAVENOUS | Status: DC

## 2023-11-15 MED ORDER — LEUCOVORIN CALCIUM INJECTION 350 MG
256.0000 mg/m2 | Freq: Once | INTRAVENOUS | Status: AC
  Administered 2023-11-15: 504 mg via INTRAVENOUS
  Filled 2023-11-15: qty 25.2

## 2023-11-15 MED ORDER — SODIUM CHLORIDE 0.9 % IV SOLN
1536.0000 mg/m2 | INTRAVENOUS | Status: DC
  Administered 2023-11-15: 3050 mg via INTRAVENOUS
  Filled 2023-11-15: qty 61

## 2023-11-15 MED ORDER — LORAZEPAM 0.5 MG PO TABS: 0.5000 mg | ORAL_TABLET | Freq: Three times a day (TID) | ORAL | 0 refills | Status: DC

## 2023-11-15 MED ORDER — SODIUM CHLORIDE 0.9 % IV SOLN
240.0000 mg | Freq: Once | INTRAVENOUS | Status: AC
  Administered 2023-11-15: 240 mg via INTRAVENOUS
  Filled 2023-11-15: qty 24

## 2023-11-15 NOTE — Progress Notes (Signed)
 Reduce doses of leucovorin  and fluorouracil  (push and pump) by an additional 20% due to persistent nausea/vomiting and diarrhea per Dr. Cornelius.

## 2023-11-15 NOTE — Progress Notes (Signed)
 [facility]  [address] 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  Stain for HER2 was negative.  PET scan revealed omental involvement. She received palliative chemotherapy with FOLFOX/nivolumab  (5-fluorouracil /leucovorin /oxaliplatin /nivolumab ). Oxaliplatin  was discontinued after 11 cycles, and she continues 5-fluorouracil /leucovorin /nivolumab .  PET in February 2024 revealed resolution of metabolic activity associated with the stomach.  Decrease in size of omental nodularity with no associated metabolic activity.   EGD in March revealed residual disease. CT imaging every few months has remained stable. She has had chronic kidney disease since developing immune mediated nephritis in February 2024, which resolved with high-dose steroids and holding nivolumab .  She receives extra IV fluids weekly to prevent recurrent acute kidney injury.   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   Her disease has remained stable with maintenance fluorouracil /leucovorin /nivolumab . CT chest, abdomen and pelvis in May of 2025 revealed persistent nonspecific diffuse gastric wall thickening with persistent subtle peritoneal nodularity inferior to the gastric body and antrum. Stable small solid pulmonary nodules in the left lung base measuring up to 6 mm, favored benign etiology. Following that, we changed her schedule from every 2 weeks to every 3 weeks.She was found to have Campylobacter in the stool after her 39th cycle of 5-fluorouracil /leucovorin /nivolumab  cycle.  She was treated with ciprofloxacin  with resolution of her diarrhea. I recommended she see Dr. Larene for consideration of repeat EGD. CT chest, abdomen,  and pelvis done on 09/29/2023 which revealed  redemonstration of irregular moderate thickening of the distal stomach body/pylorus, which is grossly similar to the prior study, there is associated mild perigastric fat stranding and multiple subcentimeter sized lymph nodes that are also grossly similar to the prior study from 06/30/2023 but progressed since the PET scan from 03/22/2022. There is no new metastatic disease identified within the chest, abdomen or pelvis.   Drug-induced interstitial nephritis History immune mediated nephritis from immunotherapy in February, which resolved with prednisone .  She has had chronic kidney disease since that episode.  She will continue to get weekly IV fluids.  She knows to push oral fluids.     Acute deep vein thrombosis (DVT) of popliteal vein of right lower extremity (HCC) Occlusive deep venous thrombosis along the popliteal and peroneal vein diagnosed in March 2025. She was placed on apixaban  10 mg twice daily for 7 days, then apixaban  5 mg twice daily indefinitely.  Aspirin 81 mg daily was discontinued.  The patient states she continues apixaban  5 mg twice daily without difficulty.   Malaise She complains of dizziness and light headedness and feeling weak. She is having increasing nausea and diarrhea from chemotherapy. I think this mainly represents toxicities from her treatment since her last scan in August was relatively stable. I will therefore reduce her chemotherapy dose by another 20% to see if it is more tolerable. I discussed the fact that most likely we will need to change treatment in the near future but will wait to see what the EGD shows. She does have a mutation to Claudin 18 so there is a new therapy that can target this mutation.   Plan: She informed me that after her last cycle of treatment she experienced nausea and diarrhea, for which she took imodium to alleviate her symptoms. I will prescribe  compazine  and lorazepam  today. Her scan in August  showed no sign of disease progression so I believe her recent symptoms are a side effect of her treatment. I explained to her that I can lower her dose of FOLFOX to allow her to tolerate this more which she agreed to. Her day 1 cycle 44 of FOLFOX and Nivolumab  is scheduled on 11/15/2023 and day 3 pump D/C, cycle 44 is scheduled on 11/17/2023. She has a WBC of 6.3, stable low hemoglobin of 11.7, and platelet count of 233,000. Her CMP is normal other than an chronic elevated creatinine of 1.25 and low total protein of 6.4. Her TSH and T4 levels today are pending. She will continue to get IV fluids once weekly. Patient informed me that she has an endoscopy scheduled on November 21st with Dr. Larene. [name] in 3 weeks with CBC, CMP, TSH, and T4. The patient understands the plans discussed today and is in agreement.  She knows to contact our office if she develops concerns prior to her next appointment.  I provided 22 minutes of face-to-face time during this encounter and > 50% was spent counseling as documented under my assessment and plan.   Mackenzie Lewis Cornish, MD  [facility] - A DEPT OF [facility] [address] [place] [address] Dept: [phone] Dept Fax: [phone]   No orders of the defined types were placed in this encounter.   CHIEF COMPLAINT:  CC: Stage IVB gastric cancer  Current Treatment: 5-fluorouracil /leucovorin /nivolumab   HISTORY OF PRESENT ILLNESS:   Oncology History  Gastric cancer (HCC)  09/10/2021 Initial Diagnosis   Gastric cancer (HCC)   09/10/2021 Cancer Staging   Staging form: Stomach, AJCC 8th Edition - Clinical stage from 09/10/2021: Stage IVB (cT4b, cN0, cM1) - Signed by Mackenzie Mackenzie VEAR, MD on 09/10/2021 Histopathologic type: Adenocarcinoma, NOS Stage prefix: Initial diagnosis Total positive nodes: 0 Histologic grade (G): G3 Histologic grading system: 3 grade system Sites of  metastasis: Peritoneal surface Diagnostic confirmation: Positive histology PLUS positive immunophenotyping and/or positive genetic studies Specimen type: Endoscopy with Biopsy Staged by: Managing physician Carcinoembryonic antigen (CEA) (ng/mL): 2.8 Carbohydrate antigen 19-9 (CA 19-9) (U/mL): 4.9 HER2 status: Unknown Microsatellite instability (MSI): Unknown Tumor location in stomach: Other Clinical staging modalities: Biopsy, Endoscopy Stage used in treatment planning: Yes National guidelines used in treatment planning: Yes Type of national guideline used in treatment planning: NCCN   09/28/2021 - 10/14/2021 Chemotherapy   Patient is on Treatment Plan : GASTROESOPHAGEAL FOLFOX + Nivolumab  q14d     09/28/2021 -  Chemotherapy   Patient is on Treatment Plan : GASTROESOPHAGEAL FOLFOX + Nivolumab  q21d (changed from q14d on 04/12/23)     12/09/2021 Genetic Testing   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  Report date is 12/09/2021.   The Multi-Cancer + RNA Panel offered by [facility] includes sequencing and/or deletion/duplication analysis of the following 84 genes:  AIP*, ALK, APC*, ATM*, AXIN2*, BAP1*, BARD1*, BLM*, BMPR1A*, BRCA1*, BRCA2*, BRIP1*, CASR, CDC73*, CDH1*, CDK4, CDKN1B*, CDKN1C*, CDKN2A, CEBPA, CHEK2*, CTNNA1*, DICER1*, DIS3L2*, EGFR, EPCAM, FH*, FLCN*, GATA2*, GPC3, GREM1, HOXB13, HRAS, KIT, MAX*, MEN1*, MET, MITF, MLH1*, MSH2*, MSH3*, MSH6*, MUTYH*, NBN*, NF1*, NF2*, NTHL1*, PALB2*, PDGFRA, PHOX2B, PMS2*, POLD1*, POLE*, POT1*, PRKAR1A*, PTCH1*, PTEN*, RAD50*, RAD51C*, RAD51D*, RB1*, RECQL4, RET, RUNX1*, SDHA*, SDHAF2*, SDHB*, SDHC*, SDHD*, SMAD4*, SMARCA4*, SMARCB1*, SMARCE1*, STK11*, SUFU*, TERC, TERT, TMEM127*, Tp53*, TSC1*, TSC2*, VHL*, WRN*, and WT1.  RNA analysis is  performed for * genes.     INTERVAL HISTORY:  Mackenzie Mackenzie is here today for repeat clinical assessment for his stage IVB gastric cancer. Patient states that she has not felt  the best lately and complains of back aches, dizziness, light-headedness, weakness, sleep disturbance, nausea, and diarrhea. She informed me that after her last cycle of treatment she experienced nausea and diarrhea, for which she took imodium to alleviate her symptoms. I will prescribe compazine  and lorazepam  today. Her scan in August showed no sign of disease progression so I believe her recent symptoms are a side effect of her treatment. I explained to her that I can lower her dose of FOLFOX to allow her to tolerate this more which she agreed to. Her day 1 cycle 44 of FOLFOX and Nivolumab  is scheduled on 11/15/2023 and day 3 pump D/C, cycle 44 is scheduled on 11/17/2023. She has a WBC of 6.3, stable low hemoglobin of 11.7, and platelet count of 233,000. Her CMP is normal other than an chronic elevated creatinine of 1.25 and low total protein of 6.4. Her TSH and T4 levels today are pending. She will continue to get IV fluids once weekly. Patient informed me that she has an endoscopy scheduled on November 21st with Dr. Larene. [name] in 3 weeks with CBC, CMP, TSH, and T4.   She denies fever, chills, night sweats, or other signs of infection. She denies cardiorespiratory issues. Her appetite is ok and she makes herself eat. Her weight has increased 1 pounds over last 3 weeks. This patient is accompanied in the office by her daughter.   REVIEW OF SYSTEMS:  Review of Systems  Constitutional:  Negative for appetite change, chills, diaphoresis, fatigue, fever and unexpected weight change.  HENT:  Negative.  Negative for hearing loss, lump/mass, mouth sores, nosebleeds, sore throat, tinnitus, trouble swallowing and voice change.   Eyes: Negative.   Respiratory: Negative.  Negative for chest tightness, cough, hemoptysis, shortness of breath and wheezing.   Cardiovascular: Negative.  Negative for chest pain, leg swelling and palpitations.  Gastrointestinal:  Positive for abdominal pain (discomfort),  diarrhea and nausea (intermittent, mild). Negative for abdominal distention, blood in stool, constipation, rectal pain and vomiting.  Endocrine: Negative.  Negative for hot flashes.  Genitourinary:  Negative for bladder incontinence, difficulty urinating, dyspareunia, dysuria, frequency, hematuria, menstrual problem, nocturia, pelvic pain, vaginal bleeding and vaginal discharge.   Musculoskeletal:  Positive for arthralgias (left shoulder), back pain (aching pain) and gait problem (due to left foot and ankle pain). Negative for flank pain, myalgias, neck pain and neck stiffness.  Skin: Negative.  Negative for itching and rash.  Neurological:  Positive for dizziness, extremity weakness (generalized weakness), gait problem (due to left foot and ankle pain), light-headedness and numbness (neuropathy feet). Negative for headaches, seizures and speech difficulty.  Hematological: Negative.  Negative for adenopathy. Does not bruise/bleed easily.  Psychiatric/Behavioral:  Positive for sleep disturbance. Negative for confusion, decreased concentration, depression and suicidal ideas. The patient is not nervous/anxious.      VITALS:   Blood pressure (!) 140/80, pulse 87, temperature 97.7 F (36.5 C), temperature source Oral, resp. rate 16, height 5' 4 (1.626 m), weight 210 lb 3.2 oz (95.3 kg), SpO2 97%.  Wt Readings from Last 3 Encounters:  11/15/23 210 lb 3.2 oz (95.3 kg)  10/25/23 209 lb 12.8 oz (95.2 kg)  10/11/23 207 lb 8 oz (94.1 kg)    Body mass index is 36.08 kg/m.  Performance status (ECOG): 1 -  Symptomatic but completely ambulatory  PHYSICAL EXAM:  Physical Exam Vitals and nursing note reviewed. Exam conducted with a chaperone present.  Constitutional:      General: She is not in acute distress.    Appearance: Normal appearance.  HENT:     Head: Normocephalic and atraumatic.     Right Ear: Tympanic membrane, ear canal and external ear normal. There is no impacted cerumen.     Left Ear:  Tympanic membrane, ear canal and external ear normal. There is no impacted cerumen.     Nose: Nose normal. No congestion or rhinorrhea.     Mouth/Throat:     Mouth: Mucous membranes are moist.     Pharynx: Oropharynx is clear. No oropharyngeal exudate or posterior oropharyngeal erythema.  Eyes:     General: No scleral icterus.    Extraocular Movements: Extraocular movements intact.     Conjunctiva/sclera: Conjunctivae normal.     Pupils: Pupils are equal, round, and reactive to light.  Cardiovascular:     Rate and Rhythm: Normal rate and regular rhythm.     Pulses: Normal pulses.     Heart sounds: Normal heart sounds. No murmur heard.    No friction rub. No gallop.  Pulmonary:     Effort: Pulmonary effort is normal.     Breath sounds: Normal breath sounds. No wheezing, rhonchi or rales.  Abdominal:     General: Bowel sounds are normal. There is no distension.     Palpations: Abdomen is soft. There is no hepatomegaly, splenomegaly or mass.     Tenderness: There is abdominal tenderness in the epigastric area.     Comments: Firmness in the upper mid abdomen.   Musculoskeletal:        General: Normal range of motion.     Cervical back: Normal range of motion and neck supple. No tenderness.     Right lower leg: Edema (trace) present.     Left lower leg: Edema (trace) present.     Left ankle: No swelling. No tenderness.  Lymphadenopathy:     Cervical: No cervical adenopathy.     Upper Body:     Right upper body: No supraclavicular or axillary adenopathy.     Left upper body: No supraclavicular or axillary adenopathy.     Lower Body: No right inguinal adenopathy. No left inguinal adenopathy.  Skin:    General: Skin is warm and dry.     Coloration: Skin is not jaundiced.     Findings: No rash.  Neurological:     Mental Status: She is alert and oriented to person, place, and time.     Cranial Nerves: No cranial nerve deficit.  Psychiatric:        Mood and Affect: Mood normal.         Behavior: Behavior normal.        Thought Content: Thought content normal.     LABS:      Latest Ref Rng & Units 11/15/2023    8:18 AM 10/25/2023    8:59 AM 09/29/2023    2:03 PM  CBC  WBC 4.0 - 10.5 K/uL 6.3  5.9  6.9   Hemoglobin 12.0 - 15.0 g/dL 88.2  88.3  88.5   Hematocrit 36.0 - 46.0 % 35.4  34.9  34.6   Platelets 150 - 400 K/uL 233  226  201       Latest Ref Rng & Units 11/15/2023    8:18 AM 10/25/2023    8:59 AM 09/29/2023  2:02 PM  CMP  Glucose 70 - 99 mg/dL 887  882  864   BUN 8 - 23 mg/dL 19  24  20    Creatinine 0.44 - 1.00 mg/dL 8.74  8.72  8.76   Sodium 135 - 145 mmol/L 137  136  137   Potassium 3.5 - 5.1 mmol/L 4.0  4.0  4.2   Chloride 98 - 111 mmol/L 102  103  103   CO2 22 - 32 mmol/L 22  22  21    Calcium  8.9 - 10.3 mg/dL 9.3  9.4  9.5   Total Protein 6.5 - 8.1 g/dL 6.4  6.5  6.4   Total Bilirubin 0.0 - 1.2 mg/dL 0.4  0.4  0.6   Alkaline Phos 38 - 126 U/L 98  95  97   AST 15 - 41 U/L 18  17  21    ALT 0 - 44 U/L 17  21  20     Lab Results  Component Value Date   TSH 2.650 11/15/2023   T4TOTAL 10.3 11/15/2023   Lab Results  Component Value Date   CEA1 2.9 09/10/2021   /  CEA  Date Value Ref Range Status  09/10/2021 2.9 0.0 - 4.7 ng/mL Final    Comment:    (NOTE)                             Nonsmokers          <3.9                             Smokers             <5.6 [employer] Electrochemiluminescence Immunoassay (ECLIA) Values obtained with different assay methods or kits cannot be used interchangeably.  Results cannot be interpreted as absolute evidence of the presence or absence of malignant disease. Performed At: [facility] [address] [place], [place] [id number] Jennette Shorter MD Ey:[phone]    STUDIES:  EXAM: 09/29/2023 CT CHEST, ABDOMEN, AND PELVIS WITH CONTRAST IMPRESSION: 1. Redemonstration of irregular moderate thickening of the distal stomach body/pylorus, grossly similar to the prior study. There  is associated mild perigastric fat stranding and multiple subcentimeter sized lymph nodes, grossly similar to the prior study from 06/30/2023 but progressed since the PET scan from 03/22/2022. These are concerning for locoregional metastatic disease. 2. No new metastatic disease identified within the chest, abdomen or pelvis. 3. Multiple other nonacute observations, as described above.  Exam: 06/30/2023 CT CHEST ABDOMEN PELVIS W CONTRAST  IMPRESSION: Persistent nonspecific diffuse gastric wall thickening with persistent subtle peritoneal nodularity inferior to the gastric body and antrum. The possibility of early peritoneal carcinomatosis cannot entirely be excluded. No convincing evidence for metastatic disease within the chest. Stable pulmonary nodules measuring up to 6 mm. Attention on follow-up.    HISTORY:   Past Medical History:  Diagnosis Date   Abdominal pain 01/17/2023   Appendicitis with peritonitis 04/10/2016   Atypical chest pain 09/09/2016   Benign hypertensive renal disease 09/01/2016   Bilateral primary osteoarthritis of knee 01/20/2016   Borderline diabetes 09/09/2016   CKD (chronic kidney disease), stage II 09/01/2016   Cyclic citrullinated peptide (CCP) antibody positive 01/20/2016   Because she has positive CCP, I want to make sure we monitor the patient closely and we encouraged the patient to look for symptoms that include increased hand stiffness, swelling and redness to  the MCP joint.  If that happens, she is to call us  so that we can schedule her for an ultrasound to look for synovitis.     Essential hypertension 09/09/2016   Gastric cancer (HCC) 09/10/2021   Hyperlipidemia 09/01/2016   Hypertension    Hypothyroidism 09/01/2016   Osteoarthritis of both feet 01/20/2016   Osteoarthritis, hand 01/20/2016   Thyroid  disease     Past Surgical History:  Procedure Laterality Date   APPENDECTOMY     LAPAROSCOPIC APPENDECTOMY N/A 04/10/2016   Procedure:  APPENDECTOMY LAPAROSCOPIC;  Surgeon: Vicenta Poli, MD;  Location: [facility] OR;  Service: General;  Laterality: N/A;    Family History  Problem Relation Age of Onset   Hypertension Mother    Prostate cancer Father        metastatic; d. 48   Brain cancer Sister 51   Breast cancer Sister 67   AAA (abdominal aortic aneurysm) Brother    Leukemia Cousin        x2 maternal female cousins; d. before 46   Breast cancer Daughter 77       DCIS    Social History:  reports that she has never smoked. She has never used smokeless tobacco. She reports that she does not currently use alcohol . She reports that she does not use drugs.The patient is alone today.  Allergies:  Allergies  Allergen Reactions   Doxycycline Rash   Sulfa Antibiotics Other (See Comments) and Rash    Other reaction(s): Other (See Comments)  Made me feel weird    Current Medications: Current Outpatient Medications  Medication Sig Dispense Refill   b complex vitamins capsule Take 1 capsule by mouth daily.     Calcium  Carbonate (CALCIUM  600 PO) Take 1 tablet by mouth daily.     Cholecalciferol (VITAMIN D3) 5000 units CAPS Take 1 capsule by mouth daily.     ELIQUIS  5 MG TABS tablet TAKE 1 TABLET TWICE A DAY 180 tablet 3   famotidine (PEPCID) 40 MG tablet Take 40 mg by mouth at bedtime.     hydrochlorothiazide (HYDRODIURIL) 25 MG tablet Take 25 mg by mouth daily.     KRILL OIL PO Take 1 capsule by mouth daily. Unknown strenght     Lactobacillus TABS Take 1 tablet by mouth 2 (two) times daily.     levothyroxine  (SYNTHROID , LEVOTHROID) 75 MCG tablet Take 75 mcg by mouth daily before breakfast.      loratadine (CLARITIN) 10 MG tablet Take 10 mg by mouth daily.     LORazepam  (ATIVAN ) 0.5 MG tablet Take 1 tablet (0.5 mg total) by mouth every 8 (eight) hours. 50 tablet 0   NON FORMULARY Take 1 Dose by mouth See admin instructions. MMW: 3 parts Maalox 2 parts Benadryl  1 part viscious lidicaine  Disp. 6oz  Instructions: 5ml  swish and swallow every 3-4 hours     omeprazole  (PRILOSEC) 40 MG capsule Take 1 capsule (40 mg total) by mouth 2 (two) times daily. 60 capsule 5   ondansetron  (ZOFRAN -ODT) 4 MG disintegrating tablet Take 1 tablet (4 mg total) by mouth every 8 (eight) hours as needed for nausea or vomiting. 90 tablet 3   polyethylene glycol powder (GLYCOLAX /MIRALAX ) 17 GM/SCOOP powder Take 1 Container by mouth daily.     potassium chloride  SA (KLOR-CON  M) 20 MEQ tablet TAKE 1 TABLET TWICE A DAY 180 tablet 3   pravastatin  (PRAVACHOL ) 20 MG tablet Take 1 tablet (20 mg total) by mouth every evening. 90 tablet 1  Probiotic Product (PROBIOTIC DAILY PO) Take 1 tablet by mouth daily.     prochlorperazine  (COMPAZINE ) 10 MG tablet Take 1 tablet (10 mg total) by mouth every 6 (six) hours as needed for nausea or vomiting. 90 tablet 3   valsartan (DIOVAN) 160 MG tablet Take 160 mg by mouth daily.     No current facility-administered medications for this visit.    I,Jasmine M Lassiter,acting as a scribe for Mackenzie Lewis Cornish, MD.,have documented all relevant documentation on the behalf of Mackenzie Lewis Cornish, MD,as directed by  Mackenzie Lewis Cornish, MD while in the presence of Mackenzie Lewis Cornish, MD.

## 2023-11-15 NOTE — Patient Instructions (Signed)
Fluorouracil Injection What is this medication? FLUOROURACIL (flure oh YOOR a sil) treats some types of cancer. It works by slowing down the growth of cancer cells. This medicine may be used for other purposes; ask your health care provider or pharmacist if you have questions. COMMON BRAND NAME(S): Adrucil What should I tell my care team before I take this medication? They need to know if you have any of these conditions: Blood disorders Dihydropyrimidine dehydrogenase (DPD) deficiency Infection, such as chickenpox, cold sores, herpes Kidney disease Liver disease Poor nutrition Recent or ongoing radiation therapy An unusual or allergic reaction to fluorouracil, other medications, foods, dyes, or preservatives If you or your partner are pregnant or trying to get pregnant Breast-feeding How should I use this medication? This medication is injected into a vein. It is administered by your care team in a hospital or clinic setting. Talk to your care team about the use of this medication in children. Special care may be needed. Overdosage: If you think you have taken too much of this medicine contact a poison control center or emergency room at once. NOTE: This medicine is only for you. Do not share this medicine with others. What if I miss a dose? Keep appointments for follow-up doses. It is important not to miss your dose. Call your care team if you are unable to keep an appointment. What may interact with this medication? Do not take this medication with any of the following: Live virus vaccines This medication may also interact with the following: Medications that treat or prevent blood clots, such as warfarin, enoxaparin, dalteparin This list may not describe all possible interactions. Give your health care provider a list of all the medicines, herbs, non-prescription drugs, or dietary supplements you use. Also tell them if you smoke, drink alcohol, or use illegal drugs. Some items may  interact with your medicine. What should I watch for while using this medication? Your condition will be monitored carefully while you are receiving this medication. This medication may make you feel generally unwell. This is not uncommon as chemotherapy can affect healthy cells as well as cancer cells. Report any side effects. Continue your course of treatment even though you feel ill unless your care team tells you to stop. In some cases, you may be given additional medications to help with side effects. Follow all directions for their use. This medication may increase your risk of getting an infection. Call your care team for advice if you get a fever, chills, sore throat, or other symptoms of a cold or flu. Do not treat yourself. Try to avoid being around people who are sick. This medication may increase your risk to bruise or bleed. Call your care team if you notice any unusual bleeding. Be careful brushing or flossing your teeth or using a toothpick because you may get an infection or bleed more easily. If you have any dental work done, tell your dentist you are receiving this medication. Avoid taking medications that contain aspirin, acetaminophen, ibuprofen, naproxen, or ketoprofen unless instructed by your care team. These medications may hide a fever. Do not treat diarrhea with over the counter products. Contact your care team if you have diarrhea that lasts more than 2 days or if it is severe and watery. This medication can make you more sensitive to the sun. Keep out of the sun. If you cannot avoid being in the sun, wear protective clothing and sunscreen. Do not use sun lamps, tanning beds, or tanning booths. Talk to   your care team if you or your partner wish to become pregnant or think you might be pregnant. This medication can cause serious birth defects if taken during pregnancy and for 3 months after the last dose. A reliable form of contraception is recommended while taking this  medication and for 3 months after the last dose. Talk to your care team about effective forms of contraception. Do not father a child while taking this medication and for 3 months after the last dose. Use a condom while having sex during this time period. Do not breastfeed while taking this medication. This medication may cause infertility. Talk to your care team if you are concerned about your fertility. What side effects may I notice from receiving this medication? Side effects that you should report to your care team as soon as possible: Allergic reactions--skin rash, itching, hives, swelling of the face, lips, tongue, or throat Heart attack--pain or tightness in the chest, shoulders, arms, or jaw, nausea, shortness of breath, cold or clammy skin, feeling faint or lightheaded Heart failure--shortness of breath, swelling of the ankles, feet, or hands, sudden weight gain, unusual weakness or fatigue Heart rhythm changes--fast or irregular heartbeat, dizziness, feeling faint or lightheaded, chest pain, trouble breathing High ammonia level--unusual weakness or fatigue, confusion, loss of appetite, nausea, vomiting, seizures Infection--fever, chills, cough, sore throat, wounds that don't heal, pain or trouble when passing urine, general feeling of discomfort or being unwell Low red blood cell level--unusual weakness or fatigue, dizziness, headache, trouble breathing Pain, tingling, or numbness in the hands or feet, muscle weakness, change in vision, confusion or trouble speaking, loss of balance or coordination, trouble walking, seizures Redness, swelling, and blistering of the skin over hands and feet Severe or prolonged diarrhea Unusual bruising or bleeding Side effects that usually do not require medical attention (report to your care team if they continue or are bothersome): Dry skin Headache Increased tears Nausea Pain, redness, or swelling with sores inside the mouth or throat Sensitivity  to light Vomiting This list may not describe all possible side effects. Call your doctor for medical advice about side effects. You may report side effects to FDA at 1-800-FDA-1088. Where should I keep my medication? This medication is given in a hospital or clinic. It will not be stored at home. NOTE: This sheet is a summary. It may not cover all possible information. If you have questions about this medicine, talk to your doctor, pharmacist, or health care provider.  2024 [employer] Standard (2021-06-09 00:00:00) Leucovorin Injection What is this medication? LEUCOVORIN (loo koe VOR in) prevents side effects from certain medications, such as methotrexate. It works by increasing folate levels. This helps protect healthy cells in your body. It may also be used to treat anemia caused by low levels of folate. It can also be used with fluorouracil, a type of chemotherapy, to treat colorectal cancer. It works by increasing the effects of fluorouracil in the body. This medicine may be used for other purposes; ask your health care provider or pharmacist if you have questions. What should I tell my care team before I take this medication? They need to know if you have any of these conditions: Anemia from low levels of vitamin B12 in the blood An unusual or allergic reaction to leucovorin, folic acid, other medications, foods, dyes, or preservatives Pregnant or trying to get pregnant Breastfeeding How should I use this medication? This medication is injected into a vein or a muscle. It is given by your care team   in a hospital or clinic setting. Talk to your care team about the use of this medication in children. Special care may be needed. Overdosage: If you think you have taken too much of this medicine contact a poison control center or emergency room at once. NOTE: This medicine is only for you. Do not share this medicine with others. What if I miss a dose? Keep appointments for follow-up doses.  It is important not to miss your dose. Call your care team if you are unable to keep an appointment. What may interact with this medication? Capecitabine Fluorouracil Phenobarbital Phenytoin Primidone Trimethoprim;sulfamethoxazole This list may not describe all possible interactions. Give your health care provider a list of all the medicines, herbs, non-prescription drugs, or dietary supplements you use. Also tell them if you smoke, drink alcohol, or use illegal drugs. Some items may interact with your medicine. What should I watch for while using this medication? Your condition will be monitored carefully while you are receiving this medication. This medication may increase the side effects of 5-fluorouracil. Tell your care team if you have diarrhea or mouth sores that do not get better or that get worse. What side effects may I notice from receiving this medication? Side effects that you should report to your care team as soon as possible: Allergic reactions--skin rash, itching, hives, swelling of the face, lips, tongue, or throat This list may not describe all possible side effects. Call your doctor for medical advice about side effects. You may report side effects to FDA at 1-800-FDA-1088. Where should I keep my medication? This medication is given in a hospital or clinic. It will not be stored at home. NOTE: This sheet is a summary. It may not cover all possible information. If you have questions about this medicine, talk to your doctor, pharmacist, or health care provider.  2024 [employer] Standard (2021-07-07 00:00:00) Nivolumab Injection What is this medication? NIVOLUMAB (nye VOL ue mab) treats some types of cancer. It works by helping your immune system slow or stop the spread of cancer cells. It is a monoclonal antibody. This medicine may be used for other purposes; ask your health care provider or pharmacist if you have questions. COMMON BRAND NAME(S): Opdivo What should I  tell my care team before I take this medication? They need to know if you have any of these conditions: Allogeneic stem cell transplant (uses someone else's stem cells) Autoimmune diseases, such as Crohn disease, ulcerative colitis, lupus History of chest radiation Nervous system problems, such as Guillain-Barre syndrome or myasthenia gravis Organ transplant An unusual or allergic reaction to nivolumab, other medications, foods, dyes, or preservatives Pregnant or trying to get pregnant Breast-feeding How should I use this medication? This medication is infused into a vein. It is given in a hospital or clinic setting. A special MedGuide will be given to you before each treatment. Be sure to read this information carefully each time. Talk to your care team about the use of this medication in children. While it may be prescribed for children as young as 12 years for selected conditions, precautions do apply. Overdosage: If you think you have taken too much of this medicine contact a poison control center or emergency room at once. NOTE: This medicine is only for you. Do not share this medicine with others. What if I miss a dose? Keep appointments for follow-up doses. It is important not to miss your dose. Call your care team if you are unable to keep an appointment. What may   interact with this medication? Interactions have not been studied. This list may not describe all possible interactions. Give your health care provider a list of all the medicines, herbs, non-prescription drugs, or dietary supplements you use. Also tell them if you smoke, drink alcohol, or use illegal drugs. Some items may interact with your medicine. What should I watch for while using this medication? Your condition will be monitored carefully while you are receiving this medication. You may need blood work while taking this medication. This medication may cause serious skin reactions. They can happen weeks to months after  starting the medication. Contact your care team right away if you notice fevers or flu-like symptoms with a rash. The rash may be red or purple and then turn into blisters or peeling of the skin. You may also notice a red rash with swelling of the face, lips, or lymph nodes in your neck or under your arms. Tell your care team right away if you have any change in your eyesight. Talk to your care team if you are pregnant or think you might be pregnant. A negative pregnancy test is required before starting this medication. A reliable form of contraception is recommended while taking this medication and for 5 months after the last dose. Talk to your care team about effective forms of contraception. Do not breast-feed while taking this medication and for 5 months after the last dose. What side effects may I notice from receiving this medication? Side effects that you should report to your care team as soon as possible: Allergic reactions--skin rash, itching, hives, swelling of the face, lips, tongue, or throat Dry cough, shortness of breath or trouble breathing Eye pain, redness, irritation, or discharge with blurry or decreased vision Heart muscle inflammation--unusual weakness or fatigue, shortness of breath, chest pain, fast or irregular heartbeat, dizziness, swelling of the ankles, feet, or hands Hormone gland problems--headache, sensitivity to light, unusual weakness or fatigue, dizziness, fast or irregular heartbeat, increased sensitivity to cold or heat, excessive sweating, constipation, hair loss, increased thirst or amount of urine, tremors or shaking, irritability Infusion reactions--chest pain, shortness of breath or trouble breathing, feeling faint or lightheaded Kidney injury (glomerulonephritis)--decrease in the amount of urine, red or dark brown urine, foamy or bubbly urine, swelling of the ankles, hands, or feet Liver injury--right upper belly pain, loss of appetite, nausea, light-colored  stool, dark yellow or brown urine, yellowing skin or eyes, unusual weakness or fatigue Pain, tingling, or numbness in the hands or feet, muscle weakness, change in vision, confusion or trouble speaking, loss of balance or coordination, trouble walking, seizures Rash, fever, and swollen lymph nodes Redness, blistering, peeling, or loosening of the skin, including inside the mouth Sudden or severe stomach pain, bloody diarrhea, fever, nausea, vomiting Side effects that usually do not require medical attention (report these to your care team if they continue or are bothersome): Bone, joint, or muscle pain Diarrhea Fatigue Loss of appetite Nausea Skin rash This list may not describe all possible side effects. Call your doctor for medical advice about side effects. You may report side effects to FDA at 1-800-FDA-1088. Where should I keep my medication? This medication is given in a hospital or clinic. It will not be stored at home. NOTE: This sheet is a summary. It may not cover all possible information. If you have questions about this medicine, talk to your doctor, pharmacist, or health care provider.  2024 [employer] Standard (2021-06-01 00:00:00)  

## 2023-11-16 LAB — T4: T4, Total: 10.3 ug/dL (ref 4.5–12.0)

## 2023-11-17 ENCOUNTER — Inpatient Hospital Stay: Attending: Hematology and Oncology

## 2023-11-17 VITALS — BP 141/60 | HR 96 | Temp 98.9°F | Resp 18

## 2023-11-17 DIAGNOSIS — Z5111 Encounter for antineoplastic chemotherapy: Secondary | ICD-10-CM | POA: Diagnosis not present

## 2023-11-17 DIAGNOSIS — C169 Malignant neoplasm of stomach, unspecified: Secondary | ICD-10-CM | POA: Diagnosis not present

## 2023-11-17 DIAGNOSIS — Z79899 Other long term (current) drug therapy: Secondary | ICD-10-CM | POA: Insufficient documentation

## 2023-11-17 DIAGNOSIS — C168 Malignant neoplasm of overlapping sites of stomach: Secondary | ICD-10-CM

## 2023-11-17 MED ORDER — SODIUM CHLORIDE 0.9 % IV SOLN: Freq: Once | INTRAVENOUS | Status: AC

## 2023-11-17 NOTE — Patient Instructions (Signed)

## 2023-11-22 ENCOUNTER — Encounter: Payer: Self-pay | Admitting: Oncology

## 2023-11-22 ENCOUNTER — Inpatient Hospital Stay

## 2023-11-22 VITALS — BP 130/63 | HR 97 | Temp 98.3°F | Resp 18

## 2023-11-22 DIAGNOSIS — E860 Dehydration: Secondary | ICD-10-CM

## 2023-11-22 DIAGNOSIS — C169 Malignant neoplasm of stomach, unspecified: Secondary | ICD-10-CM | POA: Diagnosis not present

## 2023-11-22 DIAGNOSIS — Z79899 Other long term (current) drug therapy: Secondary | ICD-10-CM | POA: Diagnosis not present

## 2023-11-22 DIAGNOSIS — R7989 Other specified abnormal findings of blood chemistry: Secondary | ICD-10-CM

## 2023-11-22 DIAGNOSIS — Z5111 Encounter for antineoplastic chemotherapy: Secondary | ICD-10-CM | POA: Diagnosis not present

## 2023-11-22 DIAGNOSIS — E876 Hypokalemia: Secondary | ICD-10-CM

## 2023-11-22 MED ORDER — SODIUM CHLORIDE 0.9 % IV SOLN: Freq: Once | INTRAVENOUS | Status: AC

## 2023-11-22 NOTE — Patient Instructions (Signed)

## 2023-11-29 ENCOUNTER — Inpatient Hospital Stay

## 2023-11-29 DIAGNOSIS — Z5111 Encounter for antineoplastic chemotherapy: Secondary | ICD-10-CM | POA: Diagnosis not present

## 2023-11-29 DIAGNOSIS — C169 Malignant neoplasm of stomach, unspecified: Secondary | ICD-10-CM | POA: Diagnosis not present

## 2023-11-29 DIAGNOSIS — E860 Dehydration: Secondary | ICD-10-CM

## 2023-11-29 DIAGNOSIS — E876 Hypokalemia: Secondary | ICD-10-CM

## 2023-11-29 DIAGNOSIS — Z79899 Other long term (current) drug therapy: Secondary | ICD-10-CM | POA: Diagnosis not present

## 2023-11-29 DIAGNOSIS — R7989 Other specified abnormal findings of blood chemistry: Secondary | ICD-10-CM

## 2023-11-29 MED ORDER — SODIUM CHLORIDE 0.9 % IV SOLN: Freq: Once | INTRAVENOUS | Status: AC

## 2023-12-06 ENCOUNTER — Inpatient Hospital Stay: Admitting: Oncology

## 2023-12-06 ENCOUNTER — Other Ambulatory Visit: Payer: Self-pay | Admitting: Oncology

## 2023-12-06 ENCOUNTER — Encounter: Payer: Self-pay | Admitting: Oncology

## 2023-12-06 ENCOUNTER — Telehealth: Payer: Self-pay

## 2023-12-06 ENCOUNTER — Inpatient Hospital Stay

## 2023-12-06 ENCOUNTER — Inpatient Hospital Stay (HOSPITAL_BASED_OUTPATIENT_CLINIC_OR_DEPARTMENT_OTHER): Admitting: Oncology

## 2023-12-06 ENCOUNTER — Other Ambulatory Visit: Payer: Self-pay

## 2023-12-06 VITALS — BP 132/76 | HR 98 | Temp 97.7°F | Resp 16 | Ht 64.0 in | Wt 207.8 lb

## 2023-12-06 DIAGNOSIS — C168 Malignant neoplasm of overlapping sites of stomach: Secondary | ICD-10-CM

## 2023-12-06 DIAGNOSIS — N142 Nephropathy induced by unspecified drug, medicament or biological substance: Secondary | ICD-10-CM | POA: Diagnosis not present

## 2023-12-06 DIAGNOSIS — C169 Malignant neoplasm of stomach, unspecified: Secondary | ICD-10-CM | POA: Diagnosis not present

## 2023-12-06 DIAGNOSIS — Z79899 Other long term (current) drug therapy: Secondary | ICD-10-CM | POA: Diagnosis not present

## 2023-12-06 DIAGNOSIS — R008 Other abnormalities of heart beat: Secondary | ICD-10-CM

## 2023-12-06 DIAGNOSIS — Z5111 Encounter for antineoplastic chemotherapy: Secondary | ICD-10-CM | POA: Diagnosis not present

## 2023-12-06 LAB — CMP (CANCER CENTER ONLY)
ALT: 22 U/L (ref 0–44)
AST: 23 U/L (ref 15–41)
Albumin: 4.1 g/dL (ref 3.5–5.0)
Alkaline Phosphatase: 97 U/L (ref 38–126)
Anion gap: 14 (ref 5–15)
BUN: 22 mg/dL (ref 8–23)
CO2: 21 mmol/L — ABNORMAL LOW (ref 22–32)
Calcium: 9.3 mg/dL (ref 8.9–10.3)
Chloride: 103 mmol/L (ref 98–111)
Creatinine: 1.32 mg/dL — ABNORMAL HIGH (ref 0.44–1.00)
GFR, Estimated: 39 mL/min — ABNORMAL LOW (ref >60)
Glucose, Bld: 197 mg/dL — ABNORMAL HIGH (ref 70–99)
Potassium: 4 mmol/L (ref 3.5–5.1)
Sodium: 138 mmol/L (ref 135–145)
Total Bilirubin: 0.6 mg/dL (ref 0.0–1.2)
Total Protein: 6.5 g/dL (ref 6.5–8.1)

## 2023-12-06 LAB — CBC WITH DIFFERENTIAL (CANCER CENTER ONLY)
Abs Immature Granulocytes: 0.02 K/uL (ref 0.00–0.07)
Basophils Absolute: 0 K/uL (ref 0.0–0.1)
Basophils Relative: 1 %
Eosinophils Absolute: 0.2 K/uL (ref 0.0–0.5)
Eosinophils Relative: 3 %
HCT: 35.5 % — ABNORMAL LOW (ref 36.0–46.0)
Hemoglobin: 11.8 g/dL — ABNORMAL LOW (ref 12.0–15.0)
Immature Granulocytes: 0 %
Lymphocytes Relative: 31 %
Lymphs Abs: 1.7 K/uL (ref 0.7–4.0)
MCH: 27.1 pg (ref 26.0–34.0)
MCHC: 33.2 g/dL (ref 30.0–36.0)
MCV: 81.4 fL (ref 80.0–100.0)
Monocytes Absolute: 0.5 K/uL (ref 0.1–1.0)
Monocytes Relative: 10 %
Neutro Abs: 3 K/uL (ref 1.7–7.7)
Neutrophils Relative %: 55 %
Platelet Count: 226 K/uL (ref 150–400)
RBC: 4.36 MIL/uL (ref 3.87–5.11)
RDW: 16 % — ABNORMAL HIGH (ref 11.5–15.5)
WBC Count: 5.5 K/uL (ref 4.0–10.5)
nRBC: 0 % (ref 0.0–0.2)

## 2023-12-06 LAB — TSH: TSH: 2.67 u[IU]/mL (ref 0.350–4.500)

## 2023-12-06 MED ORDER — SODIUM CHLORIDE 0.9 % IV SOLN
240.0000 mg | Freq: Once | INTRAVENOUS | Status: AC
  Administered 2023-12-06: 240 mg via INTRAVENOUS
  Filled 2023-12-06: qty 24

## 2023-12-06 MED ORDER — FLUOROURACIL CHEMO INJECTION 500 MG/10ML
256.0000 mg/m2 | Freq: Once | INTRAVENOUS | Status: AC
  Administered 2023-12-06: 500 mg via INTRAVENOUS
  Filled 2023-12-06: qty 10

## 2023-12-06 MED ORDER — SODIUM CHLORIDE 0.9 % IV SOLN
1536.0000 mg/m2 | INTRAVENOUS | Status: DC
  Administered 2023-12-06: 3050 mg via INTRAVENOUS
  Filled 2023-12-06: qty 61

## 2023-12-06 MED ORDER — SODIUM CHLORIDE 0.9% FLUSH: 10.0000 mL | INTRAVENOUS | Status: DC | PRN

## 2023-12-06 MED ORDER — SODIUM CHLORIDE 0.9 % IV SOLN
256.0000 mg/m2 | Freq: Once | INTRAVENOUS | Status: AC
  Administered 2023-12-06: 504 mg via INTRAVENOUS
  Filled 2023-12-06: qty 25.2

## 2023-12-06 MED ORDER — PALONOSETRON HCL INJECTION 0.25 MG/5ML
0.2500 mg | Freq: Once | INTRAVENOUS | Status: AC
  Administered 2023-12-06: 0.25 mg via INTRAVENOUS
  Filled 2023-12-06: qty 5

## 2023-12-06 MED ORDER — DEXTROSE 5 % IV SOLN: Freq: Once | INTRAVENOUS | Status: DC

## 2023-12-06 MED ORDER — SODIUM CHLORIDE 0.9 % IV SOLN: Freq: Once | INTRAVENOUS | Status: AC

## 2023-12-06 MED ORDER — DEXAMETHASONE SOD PHOSPHATE PF 10 MG/ML IJ SOLN
10.0000 mg | Freq: Once | INTRAMUSCULAR | Status: AC
  Administered 2023-12-06: 10 mg via INTRAVENOUS

## 2023-12-06 NOTE — Telephone Encounter (Signed)
-----   Message from [name] sent at 12/06/2023  9:57 AM EDT ----- Regarding: refer She sees Dr. Bernie - we need to send him the EKG and set up f/u with him to eval arrhythmia - seems like trigeminy, no symptoms except slightly elevated BP 152/60

## 2023-12-06 NOTE — Telephone Encounter (Signed)
 Message sent to Dr. Karry office for appointment as soon as possible.

## 2023-12-06 NOTE — Patient Instructions (Signed)
 The chemotherapy medication bag should finish at 46 hours. For example, if your pump is scheduled for 46 hours and it was put on at 4:00 p.m., it should finish at 2:00 p.m. the day it is scheduled to come off regardless of your appointment time.     Estimated time to finish at 1000.   If the display on your pump reads "Low Volume" and it is beeping, take the batteries out of the pump and come to the cancer center for it to be taken off.   If the pump alarms go off prior to the pump reading "Low Volume" then call [phone] and someone can assist you.  If the plunger comes out and the chemotherapy medication is leaking out, please use your home chemo spill kit to clean up the spill. Do NOT use paper towels or other household products.  If you have problems or questions regarding your pump, please call either [phone] (24 hours a day) or the cancer center Monday-Friday 8:00 a.m.- 4:30 p.m. at the clinic number and we will assist you. If you are unable to get assistance, then go to the nearest Emergency Department and ask the staff to contact the IV team for assistance.

## 2023-12-06 NOTE — Progress Notes (Signed)
 [facility]  [address] 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  Stain for HER2 was negative.  PET scan revealed omental involvement. She received palliative chemotherapy with FOLFOX/nivolumab  (5-fluorouracil /leucovorin /oxaliplatin /nivolumab ). Oxaliplatin  was discontinued after 11 cycles, and she continues 5-fluorouracil /leucovorin /nivolumab .  PET in February 2024 revealed resolution of metabolic activity associated with the stomach.  Decrease in size of omental nodularity with no associated metabolic activity.   EGD in March revealed residual disease. CT imaging every few months has remained stable. She has had chronic kidney disease since developing immune mediated nephritis in February 2024, which resolved with high-dose steroids and holding nivolumab .  She receives extra IV fluids weekly to prevent recurrent acute kidney injury.   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   Her disease has remained stable with maintenance fluorouracil /leucovorin /nivolumab . CT chest, abdomen and pelvis in May of 2025 revealed persistent nonspecific diffuse gastric wall thickening with persistent subtle peritoneal nodularity inferior to the gastric body and antrum. Stable small solid pulmonary nodules in the left lung base measuring up to 6 mm, favored benign etiology. Following that, we changed her schedule from every 2 weeks to every 3 weeks.She was found to have Campylobacter in the stool after her 39th cycle of 5-fluorouracil /leucovorin /nivolumab  cycle.  She was treated with ciprofloxacin  with resolution of her diarrhea. I recommended she see Dr. Larene for consideration of repeat EGD. CT chest, abdomen, and  pelvis done on 09/29/2023 which revealed  redemonstration of irregular moderate thickening of the distal stomach body/pylorus, which is grossly similar to the prior study, there is associated mild perigastric fat stranding and multiple subcentimeter sized lymph nodes that are also grossly similar to the prior study from 06/30/2023 but progressed since the PET scan from 03/22/2022. There is no new metastatic disease identified within the chest, abdomen or pelvis. She had leucovorin  and fluorouracil  reduced by an additional 20% due to persistent nausea/vomiting and diarrhea at the end of September.    Drug-induced interstitial nephritis History immune mediated nephritis from immunotherapy in February, which resolved with prednisone .  She has had chronic kidney disease since that episode.  She will continue to get weekly IV fluids.  She knows to push oral fluids.     Acute deep vein thrombosis (DVT) of popliteal vein of right lower extremity (HCC) Occlusive deep venous thrombosis along the popliteal and peroneal vein diagnosed in March 2025. She was placed on apixaban  10 mg twice daily for 7 days, then apixaban  5 mg twice daily indefinitely.  Aspirin 81 mg daily was discontinued.  The patient states she continues apixaban  5 mg twice daily without difficulty.   Malaise She complains of dizziness and light headedness and feeling weak. She is having increasing nausea and diarrhea from chemotherapy. I think this mainly represents toxicities from her treatment since her last scan in August was relatively stable. I will therefore reduce her chemotherapy dose by another 20% to see if it is more tolerable. I discussed the fact that most likely we will need to change treatment in the near future but will wait to see what the EGD shows. She does have a mutation to Claudin 18 so there is a new therapy that can target this mutation.   Cardiac Arrhthymias EKG shows occasional PVC's  but she was in trigeminy when I  listened during her exam. She is essentially asymptomatic from this but we will run it by Dr. Bernie.   Plan: she informed me that she has been taking Mylanta for stomach upset and sleep disturbance. She is scheduled to have an EGD done by Dr. Larene on 01/06/2024. During her physical exam I noticed trigeminy which is an new finding. Patient notes being a little dizzy when standing yesterday. She last saw her cardiologist Dr. Bernie in July and had an normal echocardiogram with an ejection fraction of 60-65%. I will have an EKG done today and schedule an appointment with her cardiologist for further evaluation. We had reduced her dose of leucovorin  and fluorouracil  by an additional 20% due to persistent nausea, vomiting, and diarrhea. Patient states that she noticed a significant difference. Her day 1 cycle 45 of FOLFOX and Nivolumab  is scheduled on 12/06/2023 and her day 3 pump D/C, cycle 45 is scheduled on 12/08/2023. She has a WBC of 5.5, low hemoglobin of 11.8, and platelet count of 226,000. Her CMP is normal other than an elevated glucose of 197 and creatinine of 1.32. She will receive 1L of normal saline and will continue to weekly. Her TSH and T4 levels today are pending but last readings in September were normal. [name] back in 3 weeks with CBC, CMP, TSH, and T4. The patient understands the plans discussed today and is in agreement.  She knows to contact our office if she develops concerns prior to her next appointment.  Review of her EKG today does reveal occasional PVC's but these are occurring less frequently than when I examined her earlier. I will forward this to  Dr. Krasowski.  I provided 19 minutes of face-to-face time during this encounter and > 50% was spent counseling as documented under my assessment and plan.   [name], MD  [facility] - A DEPT OF [facility] [address] [place]  [address] Dept: [phone] Dept Fax: [phone]   No orders of the defined types were placed in this encounter.   CHIEF COMPLAINT:  CC: Stage IVB gastric cancer  Current Treatment: 5-fluorouracil /leucovorin /nivolumab   HISTORY OF PRESENT ILLNESS:   Oncology History  Gastric cancer (HCC)  09/10/2021 Initial Diagnosis   Gastric cancer (HCC)   09/10/2021 Cancer Staging   Staging form: Stomach, AJCC 8th Edition - Clinical stage from 09/10/2021: Stage IVB (cT4b, cN0, cM1) - Signed by Cornish Wanda VEAR, MD on 09/10/2021 Histopathologic type: Adenocarcinoma, NOS Stage prefix: Initial diagnosis Total positive nodes: 0 Histologic grade (G): G3 Histologic grading system: 3 grade system Sites of metastasis: Peritoneal surface Diagnostic confirmation: Positive histology PLUS positive immunophenotyping and/or positive genetic studies Specimen type: Endoscopy with Biopsy Staged by: Managing physician Carcinoembryonic antigen (CEA) (ng/mL): 2.8 Carbohydrate antigen 19-9 (CA 19-9) (U/mL): 4.9 HER2 status: Unknown Microsatellite instability (MSI): Unknown Tumor location in stomach: Other Clinical staging modalities: Biopsy, Endoscopy Stage used in treatment planning: Yes National guidelines used in treatment planning: Yes Type of national guideline used in treatment planning: NCCN   09/28/2021 - 10/14/2021 Chemotherapy   Patient is on Treatment Plan : GASTROESOPHAGEAL FOLFOX + Nivolumab  q14d     09/28/2021 -  Chemotherapy   Patient is on Treatment Plan : GASTROESOPHAGEAL FOLFOX + Nivolumab  q21d (changed from q14d on 04/12/23)     12/09/2021 Genetic Testing   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  Report date is 12/09/2021.   The Multi-Cancer + RNA Panel offered by [facility] includes sequencing and/or deletion/duplication analysis of the following 84 genes:  AIP*, ALK, APC*, ATM*, AXIN2*, BAP1*, BARD1*, BLM*, BMPR1A*, BRCA1*, BRCA2*, BRIP1*,  CASR, CDC73*, CDH1*, CDK4, CDKN1B*, CDKN1C*, CDKN2A, CEBPA, CHEK2*, CTNNA1*, DICER1*, DIS3L2*, EGFR, EPCAM, FH*, FLCN*, GATA2*, GPC3, GREM1, HOXB13, HRAS, KIT, MAX*, MEN1*, MET, MITF, MLH1*, MSH2*, MSH3*, MSH6*, MUTYH*, NBN*, NF1*, NF2*, NTHL1*, PALB2*, PDGFRA, PHOX2B, PMS2*, POLD1*, POLE*, POT1*, PRKAR1A*, PTCH1*, PTEN*, RAD50*, RAD51C*, RAD51D*, RB1*, RECQL4, RET, RUNX1*, SDHA*, SDHAF2*, SDHB*, SDHC*, SDHD*, SMAD4*, SMARCA4*, SMARCB1*, SMARCE1*, STK11*, SUFU*, TERC, TERT, TMEM127*, Tp53*, TSC1*, TSC2*, VHL*, WRN*, and WT1.  RNA analysis is performed for * genes.     INTERVAL HISTORY:  Lory is here today for repeat clinical assessment for his stage IVB gastric cancer. Patient states that she feels ok and has no current complaints of pain. However, she informed me that she has been taking Mylanta for stomach upset and sleep disturbance. She is scheduled to have an EGD done by Dr. Larene on 01/06/2024. During her physical exam I noticed trigeminy which is an new finding. Patient notes being a little dizzy when standing yesterday. She last saw her cardiologist Dr. Bernie in July and had an normal echocardiogram with an ejection fraction of 60-65%. I will have an EKG done today and schedule an appointment with her cardiologist for further evaluation. We had reduced her dose of leucovorin  and fluorouracil  by an additional 20% due to persistent nausea, vomiting, and diarrhea. Patient states that she noticed an significant difference. Her day 1 cycle 45 of FOLFOX and Nivolumab  is scheduled on 12/06/2023 and her day 3 pump D/C, cycle 45 is scheduled on 12/08/2023. She has a WBC of 5.5, low hemoglobin of 11.8, and platelet count of 226,000. Her CMP is normal other than an elevated glucose of 197 and creatinine of 1.32. She will receive 1L of normal saline and will continue to weekly. Her TSH and T4 levels today are pending but last readings in September were normal. [name] back in 3 weeks with CBC,  CMP, TSH, and T4.   Review of her EKG today does reveal occasional PVC's but these are occurring less frequently than when I examined her earlier. I will forward this to  Dr. Krasowski.   She denies fever, chills, night sweats, or other signs of infection. She denies cardiorespiratory issues. Her appetite is same as usual and Her weight has decreased 3 pounds over last 3 weeks.This patient is accompanied in the office by her daughter.   REVIEW OF SYSTEMS:  Review of Systems  Constitutional:  Positive for appetite change (early satiety). Negative for chills, diaphoresis, fatigue, fever and unexpected weight change.  HENT:  Negative.  Negative for hearing loss, lump/mass, mouth sores, nosebleeds, sore throat, tinnitus, trouble swallowing and voice change.   Eyes: Negative.   Respiratory: Negative.  Negative for chest tightness, cough, hemoptysis, shortness of breath and wheezing.   Cardiovascular: Negative.  Negative for chest pain, leg swelling and palpitations.  Gastrointestinal:  Positive for abdominal pain (discomfort), diarrhea (improved) and nausea (intermittent, mild). Negative for abdominal distention, blood in stool, constipation, rectal pain and vomiting.  Endocrine: Negative.  Negative for hot flashes.  Genitourinary:  Negative for bladder incontinence, difficulty urinating, dyspareunia, dysuria, frequency, hematuria, menstrual problem, nocturia, pelvic pain, vaginal bleeding and vaginal discharge.   Musculoskeletal:  Positive for arthralgias (left shoulder), back pain (aching pain) and gait problem (due to left foot and ankle  pain). Negative for flank pain, myalgias, neck pain and neck stiffness.  Skin: Negative.  Negative for itching and rash.  Neurological:  Positive for dizziness, extremity weakness (generalized weakness), gait problem (due to left foot and ankle pain), light-headedness and numbness (neuropathy feet). Negative for headaches, seizures and speech difficulty.   Hematological: Negative.  Negative for adenopathy. Does not bruise/bleed easily.  Psychiatric/Behavioral:  Positive for sleep disturbance. Negative for confusion, decreased concentration, depression and suicidal ideas. The patient is not nervous/anxious.      VITALS:  Blood pressure 132/76, pulse 98, temperature 97.7 F (36.5 C), temperature source Oral, resp. rate 16, height 5' 4 (1.626 m), weight 207 lb 12.8 oz (94.3 kg), SpO2 96%.  Wt Readings from Last 3 Encounters:  12/20/23 211 lb (95.7 kg)  12/14/23 211 lb (95.7 kg)  12/08/23 212 lb (96.2 kg)    Body mass index is 35.67 kg/m.  Performance status (ECOG): 1 - Symptomatic but completely ambulatory  PHYSICAL EXAM:  Physical Exam Vitals and nursing note reviewed. Exam conducted with a chaperone present.  Constitutional:      General: She is not in acute distress.    Appearance: Normal appearance. She is normal weight.  HENT:     Head: Normocephalic and atraumatic.     Right Ear: Tympanic membrane, ear canal and external ear normal. There is no impacted cerumen.     Left Ear: Tympanic membrane, ear canal and external ear normal. There is no impacted cerumen.     Nose: Nose normal. No congestion or rhinorrhea.     Mouth/Throat:     Mouth: Mucous membranes are moist.     Pharynx: Oropharynx is clear. No oropharyngeal exudate or posterior oropharyngeal erythema.  Eyes:     General: No scleral icterus.    Extraocular Movements: Extraocular movements intact.     Conjunctiva/sclera: Conjunctivae normal.     Pupils: Pupils are equal, round, and reactive to light.  Cardiovascular:     Rate and Rhythm: Normal rate and regular rhythm.     Pulses: Normal pulses.     Heart sounds: Normal heart sounds. No murmur heard.    No friction rub. No gallop.  Pulmonary:     Effort: Pulmonary effort is normal.     Breath sounds: Normal breath sounds. No wheezing, rhonchi or rales.  Abdominal:     General: Bowel sounds are normal. There is  no distension.     Palpations: Abdomen is soft. There is no hepatomegaly, splenomegaly or mass.     Tenderness: There is abdominal tenderness in the epigastric area.     Comments: Firmness in the upper mid abdomen, possibly increased.   Musculoskeletal:        General: Normal range of motion.     Cervical back: Normal range of motion and neck supple. No tenderness.     Right lower leg: Edema (trace) present.     Left lower leg: Edema (trace) present.     Left ankle: No swelling. No tenderness.  Lymphadenopathy:     Cervical: No cervical adenopathy.     Upper Body:     Right upper body: No supraclavicular or axillary adenopathy.     Left upper body: No supraclavicular or axillary adenopathy.     Lower Body: No right inguinal adenopathy. No left inguinal adenopathy.  Skin:    General: Skin is warm and dry.     Coloration: Skin is not jaundiced.     Findings: No rash.  Neurological:  Mental Status: She is alert and oriented to person, place, and time.     Cranial Nerves: No cranial nerve deficit.  Psychiatric:        Mood and Affect: Mood normal.        Behavior: Behavior normal.        Thought Content: Thought content normal.     LABS:      Latest Ref Rng & Units 12/06/2023    9:08 AM 11/15/2023    8:18 AM 10/25/2023    8:59 AM  CBC  WBC 4.0 - 10.5 K/uL 5.5  6.3  5.9   Hemoglobin 12.0 - 15.0 g/dL 88.1  88.2  88.3   Hematocrit 36.0 - 46.0 % 35.5  35.4  34.9   Platelets 150 - 400 K/uL 226  233  226       Latest Ref Rng & Units 12/06/2023    9:08 AM 11/15/2023    8:18 AM 10/25/2023    8:59 AM  CMP  Glucose 70 - 99 mg/dL 802  887  882   BUN 8 - 23 mg/dL 22  19  24    Creatinine 0.44 - 1.00 mg/dL 8.67  8.74  8.72   Sodium 135 - 145 mmol/L 138  137  136   Potassium 3.5 - 5.1 mmol/L 4.0  4.0  4.0   Chloride 98 - 111 mmol/L 103  102  103   CO2 22 - 32 mmol/L 21  22  22    Calcium  8.9 - 10.3 mg/dL 9.3  9.3  9.4   Total Protein 6.5 - 8.1 g/dL 6.5  6.4  6.5   Total Bilirubin  0.0 - 1.2 mg/dL 0.6  0.4  0.4   Alkaline Phos 38 - 126 U/L 97  98  95   AST 15 - 41 U/L 23  18  17    ALT 0 - 44 U/L 22  17  21     Lab Results  Component Value Date   TSH 2.670 12/06/2023   T4TOTAL 8.7 12/06/2023   Lab Results  Component Value Date   CEA1 2.9 09/10/2021   /  CEA  Date Value Ref Range Status  09/10/2021 2.9 0.0 - 4.7 ng/mL Final    Comment:    (NOTE)                             Nonsmokers          <3.9                             Smokers             <5.6 [employer] Electrochemiluminescence Immunoassay (ECLIA) Values obtained with different assay methods or kits cannot be used interchangeably.  Results cannot be interpreted as absolute evidence of the presence or absence of malignant disease. Performed At: [facility] [address] [place], [place] [id number] Jennette Shorter MD Ey:[phone]    STUDIES:  EXAM: 09/29/2023 CT CHEST, ABDOMEN, AND PELVIS WITH CONTRAST IMPRESSION: 1. Redemonstration of irregular moderate thickening of the distal stomach body/pylorus, grossly similar to the prior study. There is associated mild perigastric fat stranding and multiple subcentimeter sized lymph nodes, grossly similar to the prior study from 06/30/2023 but progressed since the PET scan from 03/22/2022. These are concerning for locoregional metastatic disease. 2. No new metastatic disease identified within the chest, abdomen or pelvis. 3. Multiple  other nonacute observations, as described above.  Exam: 06/30/2023 CT CHEST ABDOMEN PELVIS W CONTRAST  IMPRESSION: Persistent nonspecific diffuse gastric wall thickening with persistent subtle peritoneal nodularity inferior to the gastric body and antrum. The possibility of early peritoneal carcinomatosis cannot entirely be excluded. No convincing evidence for metastatic disease within the chest. Stable pulmonary nodules measuring up to 6 mm. Attention on follow-up.    HISTORY:   Past Medical  History:  Diagnosis Date   Abdominal pain 01/17/2023   Appendicitis with peritonitis 04/10/2016   Atypical chest pain 09/09/2016   Benign hypertensive renal disease 09/01/2016   Bilateral primary osteoarthritis of knee 01/20/2016   Borderline diabetes 09/09/2016   CKD (chronic kidney disease), stage II 09/01/2016   Cyclic citrullinated peptide (CCP) antibody positive 01/20/2016   Because she has positive CCP, I want to make sure we monitor the patient closely and we encouraged the patient to look for symptoms that include increased hand stiffness, swelling and redness to the MCP joint.  If that happens, she is to call us  so that we can schedule her for an ultrasound to look for synovitis.     Essential hypertension 09/09/2016   Gastric cancer (HCC) 09/10/2021   Hyperlipidemia 09/01/2016   Hypertension    Hypothyroidism 09/01/2016   Osteoarthritis of both feet 01/20/2016   Osteoarthritis, hand 01/20/2016   Thyroid  disease     Past Surgical History:  Procedure Laterality Date   APPENDECTOMY     LAPAROSCOPIC APPENDECTOMY N/A 04/10/2016   Procedure: APPENDECTOMY LAPAROSCOPIC;  Surgeon: Vicenta Poli, MD;  Location: [facility] OR;  Service: General;  Laterality: N/A;    Family History  Problem Relation Age of Onset   Hypertension Mother    Prostate cancer Father        metastatic; d. 53   Brain cancer Sister 41   Breast cancer Sister 25   AAA (abdominal aortic aneurysm) Brother    Leukemia Cousin        x2 maternal female cousins; d. before 22   Breast cancer Daughter 69       DCIS    Social History:  reports that she has never smoked. She has never used smokeless tobacco. She reports that she does not currently use alcohol . She reports that she does not use drugs.The patient is alone today.  Allergies:  Allergies  Allergen Reactions   Doxycycline Rash   Sulfa Antibiotics Other (See Comments) and Rash    Other reaction(s): Other (See Comments)  Made me feel weird     Current Medications: Current Outpatient Medications  Medication Sig Dispense Refill   b complex vitamins capsule Take 1 capsule by mouth daily.     Calcium  Carbonate (CALCIUM  600 PO) Take 1 tablet by mouth daily.     Cholecalciferol (VITAMIN D3) 5000 units CAPS Take 1 capsule by mouth daily.     ELIQUIS  5 MG TABS tablet TAKE 1 TABLET TWICE A DAY 180 tablet 3   famotidine (PEPCID) 40 MG tablet Take 40 mg by mouth at bedtime.     hydrochlorothiazide (HYDRODIURIL) 25 MG tablet Take 25 mg by mouth daily.     KRILL OIL PO Take 1 capsule by mouth daily. Unknown strenght     Lactobacillus TABS Take 1 tablet by mouth 2 (two) times daily.     levothyroxine  (SYNTHROID , LEVOTHROID) 75 MCG tablet Take 75 mcg by mouth daily before breakfast.      loratadine (CLARITIN) 10 MG tablet Take 10 mg by mouth daily.  LORazepam  (ATIVAN ) 0.5 MG tablet Take 1 tablet (0.5 mg total) by mouth every 8 (eight) hours. 50 tablet 0   metoprolol succinate (TOPROL XL) 25 MG 24 hr tablet Take 1 tablet (25 mg total) by mouth daily. 90 tablet 3   NON FORMULARY Take 1 Dose by mouth See admin instructions. MMW: 3 parts Maalox 2 parts Benadryl  1 part viscious lidicaine  Disp. 6oz  Instructions: 5ml swish and swallow every 3-4 hours     omeprazole  (PRILOSEC) 40 MG capsule Take 1 capsule (40 mg total) by mouth 2 (two) times daily. 60 capsule 5   ondansetron  (ZOFRAN -ODT) 4 MG disintegrating tablet Take 1 tablet (4 mg total) by mouth every 8 (eight) hours as needed for nausea or vomiting. 90 tablet 3   polyethylene glycol powder (GLYCOLAX /MIRALAX ) 17 GM/SCOOP powder Take 1 Container by mouth daily.     potassium chloride  SA (KLOR-CON  M) 20 MEQ tablet TAKE 1 TABLET TWICE A DAY 180 tablet 3   pravastatin  (PRAVACHOL ) 20 MG tablet TAKE 1 TABLET BY MOUTH EVERY DAY IN THE EVENING 90 tablet 2   Probiotic Product (PROBIOTIC DAILY PO) Take 1 tablet by mouth daily.     prochlorperazine  (COMPAZINE ) 10 MG tablet Take 1 tablet (10 mg  total) by mouth every 6 (six) hours as needed for nausea or vomiting. 90 tablet 3   valsartan (DIOVAN) 160 MG tablet Take 160 mg by mouth daily.     No current facility-administered medications for this visit.    I,Jasmine M Lassiter,acting as a scribe for [name], MD.,have documented all relevant documentation on the behalf of [name], MD,as directed by  [name], MD while in the presence of [name], MD.

## 2023-12-07 ENCOUNTER — Other Ambulatory Visit: Payer: Self-pay | Admitting: Cardiology

## 2023-12-07 LAB — T4: T4, Total: 8.7 ug/dL (ref 4.5–12.0)

## 2023-12-08 ENCOUNTER — Inpatient Hospital Stay

## 2023-12-08 VITALS — BP 135/64 | HR 88 | Temp 97.9°F | Resp 18 | Ht 64.0 in | Wt 212.0 lb

## 2023-12-08 DIAGNOSIS — Z79899 Other long term (current) drug therapy: Secondary | ICD-10-CM | POA: Diagnosis not present

## 2023-12-08 DIAGNOSIS — C169 Malignant neoplasm of stomach, unspecified: Secondary | ICD-10-CM | POA: Diagnosis not present

## 2023-12-08 DIAGNOSIS — Z5111 Encounter for antineoplastic chemotherapy: Secondary | ICD-10-CM | POA: Diagnosis not present

## 2023-12-08 DIAGNOSIS — C168 Malignant neoplasm of overlapping sites of stomach: Secondary | ICD-10-CM

## 2023-12-08 MED ORDER — SODIUM CHLORIDE 0.9 % IV SOLN: Freq: Once | INTRAVENOUS | Status: AC

## 2023-12-08 NOTE — Patient Instructions (Signed)

## 2023-12-12 DIAGNOSIS — E785 Hyperlipidemia, unspecified: Secondary | ICD-10-CM | POA: Diagnosis not present

## 2023-12-12 DIAGNOSIS — Z6835 Body mass index (BMI) 35.0-35.9, adult: Secondary | ICD-10-CM | POA: Diagnosis not present

## 2023-12-12 DIAGNOSIS — I10 Essential (primary) hypertension: Secondary | ICD-10-CM | POA: Diagnosis not present

## 2023-12-12 DIAGNOSIS — G4733 Obstructive sleep apnea (adult) (pediatric): Secondary | ICD-10-CM | POA: Diagnosis not present

## 2023-12-12 DIAGNOSIS — E038 Other specified hypothyroidism: Secondary | ICD-10-CM | POA: Diagnosis not present

## 2023-12-12 DIAGNOSIS — C168 Malignant neoplasm of overlapping sites of stomach: Secondary | ICD-10-CM | POA: Diagnosis not present

## 2023-12-13 ENCOUNTER — Inpatient Hospital Stay

## 2023-12-13 VITALS — BP 141/97 | HR 94 | Temp 97.9°F | Resp 18 | Ht 64.0 in

## 2023-12-13 DIAGNOSIS — R7989 Other specified abnormal findings of blood chemistry: Secondary | ICD-10-CM

## 2023-12-13 DIAGNOSIS — Z79899 Other long term (current) drug therapy: Secondary | ICD-10-CM | POA: Diagnosis not present

## 2023-12-13 DIAGNOSIS — C169 Malignant neoplasm of stomach, unspecified: Secondary | ICD-10-CM | POA: Diagnosis not present

## 2023-12-13 DIAGNOSIS — E876 Hypokalemia: Secondary | ICD-10-CM

## 2023-12-13 DIAGNOSIS — Z5111 Encounter for antineoplastic chemotherapy: Secondary | ICD-10-CM | POA: Diagnosis not present

## 2023-12-13 DIAGNOSIS — E860 Dehydration: Secondary | ICD-10-CM

## 2023-12-13 MED ORDER — SODIUM CHLORIDE 0.9 % IV SOLN: Freq: Once | INTRAVENOUS | Status: AC

## 2023-12-13 NOTE — Patient Instructions (Signed)
 Dehydration, Adult Dehydration is a condition in which there is not enough water or other fluids in the body. This happens when a person loses more fluids than they take in. Important organs cannot work right without the right amount of fluids. Any loss of fluids from the body can cause dehydration. Dehydration can be mild, worse, or very bad. It should be treated right away to keep it from getting very bad. What are the causes? Conditions that cause loss of water in the body. They include: Watery poop (diarrhea). Vomiting. Sweating a lot. Fever. Infection. Peeing (urinating) a lot. Not drinking enough fluids. Certain medicines, such as medicines that take extra fluid out of the body (diuretics). Lack of safe drinking water. Not being able to get enough water and food. What increases the risk? Having a long-term (chronic) illness that has not been treated the right way, such as: Diabetes. Heart disease. Kidney disease. Being 25 years of age or older. Having a disability. Living in a place that is high above the ground or sea (high in altitude). The thinner, drier air causes more fluid loss. Doing exercises that put stress on your body for a long time. Being active when in hot places. What are the signs or symptoms? Symptoms of dehydration depend on how bad it is. Mild or worse dehydration Thirst. Dry lips or dry mouth. Feeling dizzy or light-headed. Muscle cramps. Passing little pee or dark pee. Pee may be the color of tea. Headache. Very bad dehydration Changes in skin. Skin may: Be cold to the touch (clammy). Be blotchy or pale. Not go back to normal right after you pinch it and let it go. Little or no tears, pee, or sweat. Fast breathing. Low blood pressure. Weak pulse. Pulse that is more than 100 beats a minute when you are sitting still. Other changes, such as: Feeling very thirsty. Eyes that look hollow (sunken). Cold hands and feet. Being confused. Being very  tired (lethargic) or having trouble waking from sleep. Losing weight. Loss of consciousness. How is this treated? Treatment for this condition depends on how bad your dehydration is. Treatment should start right away. Do not wait until your condition gets very bad. Very bad dehydration is an emergency. You will need to go to a hospital. Mild or worse dehydration can be treated at home. You may be asked to: Drink more fluids. Drink an oral rehydration solution (ORS). This drink gives you the right amount of fluids, salts, and minerals (electrolytes). Very bad dehydration can be treated: With fluids through an IV tube. By correcting low levels of electrolytes in the body. By treating the problem that caused your dehydration. Follow these instructions at home: Oral rehydration solution If told by your doctor, drink an ORS: Make an ORS. Use instructions on the package. Start by drinking small amounts, about  cup (120 mL) every 5-10 minutes. Slowly drink more until you have had the amount that your doctor said to have.  Eating and drinking  Drink enough clear fluid to keep your pee pale yellow. If you were told to drink an ORS, finish the ORS first. Then, start slowly drinking other clear fluids. Drink fluids such as: Water. Do not drink only water. Doing that can make the salt (sodium) level in your body get too low. Water from ice chips you suck on. Fruit juice that you have added water to (diluted). Low-calorie sports drinks. Eat foods that have the right amounts of salts and minerals, such as bananas, oranges, potatoes,  tomatoes, or spinach. Do not drink alcohol. Avoid drinks that have caffeine or sugar. These include:: High-calorie sports drinks. Fruit juice that you did not add water to. Soda. Coffee or energy drinks. Avoid foods that are greasy or have a lot of fat or sugar. General instructions Take over-the-counter and prescription medicines only as told by your doctor. Do  not take sodium tablets. Doing that can make the salt level in your body get too high. Return to your normal activities as told by your doctor. Ask your doctor what activities are safe for you. Keep all follow-up visits. Your doctor may check and change your treatment. Contact a doctor if: You have pain in your belly (abdomen) and the pain: Gets worse. Stays in one place. You have a rash. You have a stiff neck. You get angry or annoyed more easily than normal. You are more tired or have a harder time waking than normal. You feel weak or dizzy. You feel very thirsty. Get help right away if: You have any symptoms of very bad dehydration. You vomit every time you eat or drink. Your vomiting gets worse, does not go away, or you vomit blood or green stuff. You are getting treatment, but symptoms are getting worse. You have a fever. You have a very bad headache. You have: Diarrhea that gets worse or does not go away. Blood in your poop (stool). This may cause poop to look black and tarry. No pee in 6-8 hours. Only a small amount of pee in 6-8 hours, and the pee is very dark. You have trouble breathing. These symptoms may be an emergency. Get help right away. Call 911. Do not wait to see if the symptoms will go away. Do not drive yourself to the hospital. This information is not intended to replace advice given to you by your health care provider. Make sure you discuss any questions you have with your health care provider. Document Revised: 08/31/2021 Document Reviewed: 08/31/2021 Elsevier Patient Education  2024 [employer].

## 2023-12-14 ENCOUNTER — Ambulatory Visit: Attending: Cardiology

## 2023-12-14 ENCOUNTER — Encounter: Payer: Self-pay | Admitting: Cardiology

## 2023-12-14 ENCOUNTER — Ambulatory Visit: Attending: Cardiology | Admitting: Cardiology

## 2023-12-14 ENCOUNTER — Telehealth: Payer: Self-pay | Admitting: Cardiology

## 2023-12-14 VITALS — BP 150/84 | HR 90 | Ht 64.0 in | Wt 211.0 lb

## 2023-12-14 DIAGNOSIS — I493 Ventricular premature depolarization: Secondary | ICD-10-CM | POA: Insufficient documentation

## 2023-12-14 DIAGNOSIS — R002 Palpitations: Secondary | ICD-10-CM | POA: Insufficient documentation

## 2023-12-14 DIAGNOSIS — E782 Mixed hyperlipidemia: Secondary | ICD-10-CM | POA: Diagnosis not present

## 2023-12-14 DIAGNOSIS — I10 Essential (primary) hypertension: Secondary | ICD-10-CM | POA: Diagnosis not present

## 2023-12-14 DIAGNOSIS — C168 Malignant neoplasm of overlapping sites of stomach: Secondary | ICD-10-CM | POA: Insufficient documentation

## 2023-12-14 MED ORDER — METOPROLOL SUCCINATE ER 25 MG PO TB24: 25.0000 mg | ORAL_TABLET | Freq: Every day | ORAL | 3 refills | Status: DC

## 2023-12-14 NOTE — Patient Instructions (Addendum)
 Medication Instructions:   START: Metoprolol Succinate 25mg  1 tablet daily on December 21, 2023   Lab Work: None Ordered If you have labs (blood work) drawn today and your tests are completely normal, you will receive your results only by: [employer] (if you have MyChart) OR A paper copy in the mail If you have any lab test that is abnormal or we need to change your treatment, we will call you to review the results.   Testing/Procedures:  WHY IS MY DOCTOR PRESCRIBING ZIO? The Zio system is proven and trusted by physicians to detect and diagnose irregular heart rhythms -- and has been prescribed to hundreds of thousands of patients.  The FDA has cleared the Zio system to monitor for many different kinds of irregular heart rhythms. In a study, physicians were able to reach a diagnosis 90% of the time with the Zio system1.  You can wear the Zio monitor -- a small, discreet, comfortable patch -- during your normal day-to-day activity, including while you sleep, shower, and exercise, while it records every single heartbeat for analysis.  1Barrett, P., et al. Comparison of 24 Hour Holter Monitoring Versus 14 Day Novel Adhesive Patch Electrocardiographic Monitoring. American Journal of Medicine, 2014.  ZIO VS. HOLTER MONITORING The Zio monitor can be comfortably worn for up to 14 days. Holter monitors can be worn for 24 to 48 hours, limiting the time to record any irregular heart rhythms you may have. Zio is able to capture data for the 51% of patients who have their first symptom-triggered arrhythmia after 48 hours.1  LIVE WITHOUT RESTRICTIONS The Zio ambulatory cardiac monitor is a small, unobtrusive, and water-resistant patch--you might even forget you're wearing it. The Zio monitor records and stores every beat of your heart, whether you're sleeping, working out, or showering.     Follow-Up: At [facility], you and your health needs are our priority.  As part of our continuing  mission to provide you with exceptional heart care, we have created designated Provider Care Teams.  These Care Teams include your primary Cardiologist (physician) and Advanced Practice Providers (APPs -  Physician Assistants and Nurse Practitioners) who all work together to provide you with the care you need, when you need it.  We recommend signing up for the patient portal called MyChart.  Sign up information is provided on this After Visit Summary.  MyChart is used to connect with patients for Virtual Visits (Telemedicine).  Patients are able to view lab/test results, encounter notes, upcoming appointments, etc.  Non-urgent messages can be sent to your provider as well.   To learn more about what you can do with MyChart, go to [email or web address].    Your next appointment:   5 month(s)  The format for your next appointment:   In Person  Provider:   Lamar Fitch, MD    Other Instructions NA

## 2023-12-14 NOTE — Telephone Encounter (Signed)
 Pt requesting a c/b too discuss some information from her office visit today.

## 2023-12-14 NOTE — Progress Notes (Signed)
 Cardiology Office Note:    Date:  12/14/2023   ID:  Mackenzie Lewis, DOB 03/19/1935, MRN [id number]  PCP:  Jefferey Fitch, MD  Cardiologist:  Lamar Fitch, MD    Referring MD: Jefferey Fitch, MD   Chief Complaint  Patient presents with   Irregular Heart Beat  Past medical history significant for essential hypertension, chronic kidney failure, dyslipidemia initially was sent to me because of chest pain but then she was find to have stomach cancer being aggressively and excellently treated like always by our oncology team.  She also was found to have DVT treated with anticoagulation she was referred back to us  because on the last visit in oncology she was found to have some ventricular ectopy in form of trigeminy.  She is completely unaware of this she denies have any chest pain tightness squeezing pressure burning chest no dizziness no palpitations no passing out but overall she complained of having some issue with the bowel.  History of Present Illness:    Mackenzie Lewis is a 88 y.o. female    Past Medical History:  Diagnosis Date   Abdominal pain 01/17/2023   Appendicitis with peritonitis 04/10/2016   Atypical chest pain 09/09/2016   Benign hypertensive renal disease 09/01/2016   Bilateral primary osteoarthritis of knee 01/20/2016   Borderline diabetes 09/09/2016   CKD (chronic kidney disease), stage II 09/01/2016   Cyclic citrullinated peptide (CCP) antibody positive 01/20/2016   Because she has positive CCP, I want to make sure we monitor the patient closely and we encouraged the patient to look for symptoms that include increased hand stiffness, swelling and redness to the MCP joint.  If that happens, she is to call us  so that we can schedule her for an ultrasound to look for synovitis.     Essential hypertension 09/09/2016   Gastric cancer (HCC) 09/10/2021   Hyperlipidemia 09/01/2016   Hypertension    Hypothyroidism 09/01/2016   Osteoarthritis of both feet  01/20/2016   Osteoarthritis, hand 01/20/2016   Thyroid  disease     Past Surgical History:  Procedure Laterality Date   APPENDECTOMY     LAPAROSCOPIC APPENDECTOMY N/A 04/10/2016   Procedure: APPENDECTOMY LAPAROSCOPIC;  Surgeon: Vicenta Poli, MD;  Location: [facility] OR;  Service: General;  Laterality: N/A;    Current Medications: Current Meds  Medication Sig   b complex vitamins capsule Take 1 capsule by mouth daily.   Calcium  Carbonate (CALCIUM  600 PO) Take 1 tablet by mouth daily.   Cholecalciferol (VITAMIN D3) 5000 units CAPS Take 1 capsule by mouth daily.   ELIQUIS  5 MG TABS tablet TAKE 1 TABLET TWICE A DAY   famotidine (PEPCID) 40 MG tablet Take 40 mg by mouth at bedtime.   hydrochlorothiazide (HYDRODIURIL) 25 MG tablet Take 25 mg by mouth daily.   KRILL OIL PO Take 1 capsule by mouth daily. Unknown strenght   Lactobacillus TABS Take 1 tablet by mouth 2 (two) times daily.   levothyroxine  (SYNTHROID , LEVOTHROID) 75 MCG tablet Take 75 mcg by mouth daily before breakfast.    loratadine (CLARITIN) 10 MG tablet Take 10 mg by mouth daily.   LORazepam  (ATIVAN ) 0.5 MG tablet Take 1 tablet (0.5 mg total) by mouth every 8 (eight) hours.   NON FORMULARY Take 1 Dose by mouth See admin instructions. MMW: 3 parts Maalox 2 parts Benadryl  1 part viscious lidicaine  Disp. 6oz  Instructions: 5ml swish and swallow every 3-4 hours   omeprazole  (PRILOSEC) 40 MG capsule Take 1 capsule (40  mg total) by mouth 2 (two) times daily.   ondansetron  (ZOFRAN -ODT) 4 MG disintegrating tablet Take 1 tablet (4 mg total) by mouth every 8 (eight) hours as needed for nausea or vomiting.   polyethylene glycol powder (GLYCOLAX /MIRALAX ) 17 GM/SCOOP powder Take 1 Container by mouth daily.   potassium chloride  SA (KLOR-CON  M) 20 MEQ tablet TAKE 1 TABLET TWICE A DAY   pravastatin  (PRAVACHOL ) 20 MG tablet TAKE 1 TABLET BY MOUTH EVERY DAY IN THE EVENING   Probiotic Product (PROBIOTIC DAILY PO) Take 1 tablet by mouth  daily.   prochlorperazine  (COMPAZINE ) 10 MG tablet Take 1 tablet (10 mg total) by mouth every 6 (six) hours as needed for nausea or vomiting.   valsartan (DIOVAN) 160 MG tablet Take 160 mg by mouth daily.   [DISCONTINUED] metoprolol succinate (TOPROL XL) 25 MG 24 hr tablet Take 1 tablet (25 mg total) by mouth daily.     Allergies:   Doxycycline and Sulfa antibiotics   Social History   Socioeconomic History   Marital status: Divorced    Spouse name: Not on file   Number of children: 2   Years of education: 12   Highest education level: 12th grade  Occupational History   Occupation: RETIRED  Tobacco Use   Smoking status: Never   Smokeless tobacco: Never  Vaping Use   Vaping status: Never Used  Substance and Sexual Activity   Alcohol  use: Not Currently    Comment: occasionally/ 1 time per month   Drug use: Never   Sexual activity: Not Currently  Other Topics Concern   Not on file  Social History Narrative   Not on file   Social Drivers of Health   Financial Resource Strain: Low Risk  (01/04/2022)   Overall Financial Resource Strain (CARDIA)    Difficulty of Paying Living Expenses: Not hard at all  Food Insecurity: No Food Insecurity (01/04/2022)   Hunger Vital Sign    Worried About Running Out of Food in the Last Year: Never true    Ran Out of Food in the Last Year: Never true  Transportation Needs: No Transportation Needs (01/04/2022)   PRAPARE - Administrator, Civil Service (Medical): No    Lack of Transportation (Non-Medical): No  Physical Activity: Inactive (01/04/2022)   Exercise Vital Sign    Days of Exercise per Week: 0 days    Minutes of Exercise per Session: 0 min  Stress: No Stress Concern Present (01/04/2022)   [employer] of Occupational Health - Occupational Stress Questionnaire    Feeling of Stress : Only a little  Social Connections: Socially Integrated (01/04/2022)   Social Connection and Isolation Panel    Frequency of  Communication with Friends and Family: More than three times a week    Frequency of Social Gatherings with Friends and Family: More than three times a week    Attends Religious Services: More than 4 times per year    Active Member of [employer] or Organizations: Yes    Attends Engineer, Structural: More than 4 times per year    Marital Status: Living with partner     Family History: The patient's family history includes AAA (abdominal aortic aneurysm) in her brother; Brain cancer (age of onset: 69) in her sister; Breast cancer (age of onset: 40) in her daughter; Breast cancer (age of onset: 62) in her sister; Hypertension in her mother; Leukemia in her cousin; Prostate cancer in her father. ROS:   Please see the history  of present illness.    All 14 point review of systems negative except as described per history of present illness  EKGs/Labs/Other Studies Reviewed:    EKG Interpretation Date/Time:  Wednesday December 14 2023 11:04:11 EDT Ventricular Rate:  90 PR Interval:  166 QRS Duration:  92 QT Interval:  354 QTC Calculation: 433 R Axis:   26  Text Interpretation: Sinus rhythm with occasional Premature ventricular complexes When compared with ECG of 06-Dec-2023 10:16, No significant change was found Confirmed by Bernie Charleston [id number]) on 12/14/2023 11:07:53 AM    Recent Labs: 12/06/2023: ALT 22; BUN 22; Creatinine 1.32; Hemoglobin 11.8; Platelet Count 226; Potassium 4.0; Sodium 138; TSH 2.670  Recent Lipid Panel    Component Value Date/Time   CHOL 176 06/10/2022 0853   CHOL 181 03/19/2020 1148   TRIG 145 06/10/2022 0853   HDL 77 06/10/2022 0853   HDL 64 03/19/2020 1148   CHOLHDL 2.3 06/10/2022 0853   VLDL 29 06/10/2022 0853   LDLCALC 70 06/10/2022 0853   LDLCALC 92 03/19/2020 1148    Physical Exam:    VS:  BP (!) 150/84   Pulse 90   Ht 5' 4 (1.626 m)   Wt 211 lb (95.7 kg)   SpO2 96%   BMI 36.22 kg/m     Wt Readings from Last 3 Encounters:  12/14/23  211 lb (95.7 kg)  12/08/23 212 lb (96.2 kg)  12/06/23 207 lb 12.8 oz (94.3 kg)     GEN:  Well nourished, well developed in no acute distress HEENT: Normal NECK: No JVD; No carotid bruits LYMPHATICS: No lymphadenopathy CARDIAC: RRR, no murmurs, no rubs, no gallops RESPIRATORY:  Clear to auscultation without rales, wheezing or rhonchi  ABDOMEN: Soft, non-tender, non-distended MUSCULOSKELETAL:  No edema; No deformity  SKIN: Warm and dry LOWER EXTREMITIES: no swelling NEUROLOGIC:  Alert and oriented x 3 PSYCHIATRIC:  Normal affect   ASSESSMENT:    1. Essential hypertension   2. Palpitations   3. Ventricular ectopy   4. Mixed hyperlipidemia   5. Malignant neoplasm of overlapping sites of stomach (HCC)    PLAN:    In order of problems listed above:  Extra systole look like ventricular.  I will ask you to wear Zio patch for 2 weeks before first week without any medication second week will be with 25 Metoprolol succinate.  She does have preserved ejection fraction, no symptoms so therefore I think overall is a low risk scenario. Essential hypertension, blood pressure is well-controlled we will continue present management. Stomach cancer followed by oncology team   Medication Adjustments/Labs and Tests Ordered: Current medicines are reviewed at length with the patient today.  Concerns regarding medicines are outlined above.  Orders Placed This Encounter  Procedures   LONG TERM MONITOR (3-14 DAYS)   EKG 12-Lead   Medication changes:  Meds ordered this encounter  Medications   DISCONTD: metoprolol succinate (TOPROL XL) 25 MG 24 hr tablet    Sig: Take 1 tablet (25 mg total) by mouth daily.    Dispense:  90 tablet    Refill:  3   metoprolol succinate (TOPROL XL) 25 MG 24 hr tablet    Sig: Take 1 tablet (25 mg total) by mouth daily.    Dispense:  90 tablet    Refill:  3    Signed, [place] DOROTHA Bernie, MD, [facility] 12/14/2023 11:50 AM    [facility]

## 2023-12-15 ENCOUNTER — Other Ambulatory Visit: Payer: Self-pay

## 2023-12-20 ENCOUNTER — Inpatient Hospital Stay: Attending: Hematology and Oncology

## 2023-12-20 VITALS — BP 171/80 | HR 88 | Temp 97.9°F | Resp 20 | Ht 64.0 in | Wt 211.0 lb

## 2023-12-20 DIAGNOSIS — Z79899 Other long term (current) drug therapy: Secondary | ICD-10-CM | POA: Diagnosis not present

## 2023-12-20 DIAGNOSIS — Z7982 Long term (current) use of aspirin: Secondary | ICD-10-CM | POA: Insufficient documentation

## 2023-12-20 DIAGNOSIS — R7989 Other specified abnormal findings of blood chemistry: Secondary | ICD-10-CM

## 2023-12-20 DIAGNOSIS — Z5111 Encounter for antineoplastic chemotherapy: Secondary | ICD-10-CM | POA: Insufficient documentation

## 2023-12-20 DIAGNOSIS — D649 Anemia, unspecified: Secondary | ICD-10-CM | POA: Diagnosis not present

## 2023-12-20 DIAGNOSIS — C169 Malignant neoplasm of stomach, unspecified: Secondary | ICD-10-CM | POA: Insufficient documentation

## 2023-12-20 DIAGNOSIS — N182 Chronic kidney disease, stage 2 (mild): Secondary | ICD-10-CM | POA: Diagnosis not present

## 2023-12-20 DIAGNOSIS — Z7901 Long term (current) use of anticoagulants: Secondary | ICD-10-CM | POA: Insufficient documentation

## 2023-12-20 DIAGNOSIS — I129 Hypertensive chronic kidney disease with stage 1 through stage 4 chronic kidney disease, or unspecified chronic kidney disease: Secondary | ICD-10-CM | POA: Insufficient documentation

## 2023-12-20 DIAGNOSIS — E860 Dehydration: Secondary | ICD-10-CM

## 2023-12-20 DIAGNOSIS — Z86718 Personal history of other venous thrombosis and embolism: Secondary | ICD-10-CM | POA: Diagnosis not present

## 2023-12-20 DIAGNOSIS — E876 Hypokalemia: Secondary | ICD-10-CM

## 2023-12-20 DIAGNOSIS — R918 Other nonspecific abnormal finding of lung field: Secondary | ICD-10-CM | POA: Insufficient documentation

## 2023-12-20 MED ORDER — SODIUM CHLORIDE 0.9 % IV SOLN: Freq: Once | INTRAVENOUS | Status: AC

## 2023-12-20 NOTE — Patient Instructions (Signed)

## 2023-12-23 ENCOUNTER — Encounter: Payer: Self-pay | Admitting: Oncology

## 2023-12-27 ENCOUNTER — Inpatient Hospital Stay

## 2023-12-27 ENCOUNTER — Inpatient Hospital Stay (HOSPITAL_BASED_OUTPATIENT_CLINIC_OR_DEPARTMENT_OTHER): Admitting: Oncology

## 2023-12-27 ENCOUNTER — Encounter: Payer: Self-pay | Admitting: Oncology

## 2023-12-27 ENCOUNTER — Other Ambulatory Visit: Payer: Self-pay | Admitting: Oncology

## 2023-12-27 VITALS — BP 142/69 | HR 80 | Temp 97.8°F | Resp 18 | Ht 64.0 in | Wt 210.1 lb

## 2023-12-27 DIAGNOSIS — C168 Malignant neoplasm of overlapping sites of stomach: Secondary | ICD-10-CM

## 2023-12-27 DIAGNOSIS — Z5111 Encounter for antineoplastic chemotherapy: Secondary | ICD-10-CM | POA: Diagnosis not present

## 2023-12-27 DIAGNOSIS — N142 Nephropathy induced by unspecified drug, medicament or biological substance: Secondary | ICD-10-CM | POA: Diagnosis not present

## 2023-12-27 DIAGNOSIS — N182 Chronic kidney disease, stage 2 (mild): Secondary | ICD-10-CM | POA: Diagnosis not present

## 2023-12-27 DIAGNOSIS — E038 Other specified hypothyroidism: Secondary | ICD-10-CM | POA: Diagnosis not present

## 2023-12-27 DIAGNOSIS — D649 Anemia, unspecified: Secondary | ICD-10-CM | POA: Diagnosis not present

## 2023-12-27 DIAGNOSIS — E785 Hyperlipidemia, unspecified: Secondary | ICD-10-CM

## 2023-12-27 DIAGNOSIS — R918 Other nonspecific abnormal finding of lung field: Secondary | ICD-10-CM | POA: Diagnosis not present

## 2023-12-27 DIAGNOSIS — C169 Malignant neoplasm of stomach, unspecified: Secondary | ICD-10-CM | POA: Diagnosis not present

## 2023-12-27 DIAGNOSIS — I129 Hypertensive chronic kidney disease with stage 1 through stage 4 chronic kidney disease, or unspecified chronic kidney disease: Secondary | ICD-10-CM | POA: Diagnosis not present

## 2023-12-27 LAB — CBC WITH DIFFERENTIAL (CANCER CENTER ONLY)
Abs Immature Granulocytes: 0.02 K/uL (ref 0.00–0.07)
Basophils Absolute: 0 K/uL (ref 0.0–0.1)
Basophils Relative: 1 %
Eosinophils Absolute: 0.2 K/uL (ref 0.0–0.5)
Eosinophils Relative: 3 %
HCT: 34.9 % — ABNORMAL LOW (ref 36.0–46.0)
Hemoglobin: 11.5 g/dL — ABNORMAL LOW (ref 12.0–15.0)
Immature Granulocytes: 0 %
Lymphocytes Relative: 32 %
Lymphs Abs: 2 K/uL (ref 0.7–4.0)
MCH: 26.7 pg (ref 26.0–34.0)
MCHC: 33 g/dL (ref 30.0–36.0)
MCV: 81.2 fL (ref 80.0–100.0)
Monocytes Absolute: 0.8 K/uL (ref 0.1–1.0)
Monocytes Relative: 14 %
Neutro Abs: 3.1 K/uL (ref 1.7–7.7)
Neutrophils Relative %: 50 %
Platelet Count: 228 K/uL (ref 150–400)
RBC: 4.3 MIL/uL (ref 3.87–5.11)
RDW: 16.2 % — ABNORMAL HIGH (ref 11.5–15.5)
WBC Count: 6.1 K/uL (ref 4.0–10.5)
nRBC: 0 % (ref 0.0–0.2)

## 2023-12-27 LAB — CMP (CANCER CENTER ONLY)
ALT: 20 U/L (ref 0–44)
AST: 20 U/L (ref 15–41)
Albumin: 4.2 g/dL (ref 3.5–5.0)
Alkaline Phosphatase: 97 U/L (ref 38–126)
Anion gap: 10 (ref 5–15)
BUN: 22 mg/dL (ref 8–23)
CO2: 25 mmol/L (ref 22–32)
Calcium: 8.9 mg/dL (ref 8.9–10.3)
Chloride: 104 mmol/L (ref 98–111)
Creatinine: 1.23 mg/dL — ABNORMAL HIGH (ref 0.44–1.00)
GFR, Estimated: 43 mL/min — ABNORMAL LOW (ref >60)
Glucose, Bld: 110 mg/dL — ABNORMAL HIGH (ref 70–99)
Potassium: 3.9 mmol/L (ref 3.5–5.1)
Sodium: 139 mmol/L (ref 135–145)
Total Bilirubin: 0.5 mg/dL (ref 0.0–1.2)
Total Protein: 6.4 g/dL — ABNORMAL LOW (ref 6.5–8.1)

## 2023-12-27 MED ORDER — PALONOSETRON HCL INJECTION 0.25 MG/5ML
0.2500 mg | Freq: Once | INTRAVENOUS | Status: AC
  Administered 2023-12-27: 0.25 mg via INTRAVENOUS
  Filled 2023-12-27: qty 5

## 2023-12-27 MED ORDER — SODIUM CHLORIDE 0.9 % IV SOLN
240.0000 mg | Freq: Once | INTRAVENOUS | Status: AC
  Administered 2023-12-27: 240 mg via INTRAVENOUS
  Filled 2023-12-27: qty 24

## 2023-12-27 MED ORDER — DEXTROSE 5 % IV SOLN: Freq: Once | INTRAVENOUS | Status: DC

## 2023-12-27 MED ORDER — DEXAMETHASONE SOD PHOSPHATE PF 10 MG/ML IJ SOLN
10.0000 mg | Freq: Once | INTRAMUSCULAR | Status: AC
  Administered 2023-12-27: 10 mg via INTRAVENOUS

## 2023-12-27 MED ORDER — LEUCOVORIN CALCIUM INJECTION 350 MG
256.0000 mg/m2 | Freq: Once | INTRAVENOUS | Status: AC
  Administered 2023-12-27: 504 mg via INTRAVENOUS
  Filled 2023-12-27: qty 25.2

## 2023-12-27 MED ORDER — FLUOROURACIL CHEMO INJECTION 500 MG/10ML
256.0000 mg/m2 | Freq: Once | INTRAVENOUS | Status: AC
  Administered 2023-12-27: 500 mg via INTRAVENOUS
  Filled 2023-12-27: qty 10

## 2023-12-27 MED ORDER — SODIUM CHLORIDE 0.9 % IV SOLN: Freq: Once | INTRAVENOUS | Status: AC

## 2023-12-27 MED ORDER — SODIUM CHLORIDE 0.9% FLUSH: 10.0000 mL | INTRAVENOUS | Status: DC | PRN

## 2023-12-27 MED ORDER — SODIUM CHLORIDE 0.9 % IV SOLN
1536.0000 mg/m2 | INTRAVENOUS | Status: DC
  Administered 2023-12-27: 3050 mg via INTRAVENOUS
  Filled 2023-12-27: qty 61

## 2023-12-27 NOTE — Progress Notes (Signed)
 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  Stain for HER2 was negative.  PET scan revealed omental involvement. She received palliative chemotherapy with FOLFOX/nivolumab  (5-fluorouracil /leucovorin /oxaliplatin /nivolumab ). Oxaliplatin  was discontinued after 11 cycles, and she continues 5-fluorouracil /leucovorin /nivolumab .  PET in February 2024 revealed resolution of metabolic activity associated with the stomach.  Decrease in size of omental nodularity with no associated metabolic activity.   EGD in March revealed residual disease. CT imaging every few months has remained stable. She has had chronic kidney disease since developing immune mediated nephritis in February 2024, which resolved with high-dose steroids and holding nivolumab .  She receives extra IV fluids weekly to prevent recurrent acute kidney injury.   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   Her disease has remained stable with maintenance fluorouracil /leucovorin /nivolumab . CT chest, abdomen and pelvis in May of 2025 revealed persistent nonspecific diffuse gastric wall thickening with persistent subtle peritoneal nodularity inferior to the gastric body and antrum. Stable small solid pulmonary nodules in the left lung base measuring up to 6 mm, favored benign etiology. Following that, we changed her schedule from every 2 weeks to every 3 weeks.She was found to have Campylobacter in the stool after her 39th cycle of 5-fluorouracil /leucovorin /nivolumab  cycle. CT chest, abdomen, and pelvis done on 09/29/2023 which revealed  redemonstration of irregular moderate thickening of the distal stomach body/pylorus, which is  grossly similar to the prior study, there is associated mild perigastric fat stranding and multiple subcentimeter sized lymph nodes that are also grossly similar to the prior study from 06/30/2023 but progressed since the PET scan from 03/22/2022. There is no new metastatic disease identified within the chest, abdomen or pelvis. She had leucovorin  and fluorouracil  reduced by an additional 20% due to persistent nausea/vomiting and diarrhea at the end of September. I do feel that her disease is progressing based on her symptoms and physical exam so she will have an EGD and CT scan this month. We will then meet in 3 weeks to discuss results and make decisions on treatment.    Drug-induced interstitial nephritis History immune mediated nephritis from immunotherapy in February, which resolved with prednisone .  She has had chronic kidney disease since that episode.  She will continue to get weekly IV fluids.  She knows to push oral fluids.     Acute deep vein thrombosis (DVT) of popliteal vein of right lower extremity (HCC) Occlusive deep venous thrombosis along the popliteal and peroneal vein diagnosed in March 2025. She was placed on apixaban  10 mg twice daily for 7 days, then apixaban  5 mg twice daily indefinitely.  Aspirin 81 mg daily was discontinued.  The patient states she continues apixaban  5 mg twice daily without difficulty.  Cardiac Arrhthymias EKG shows occasional PVC's but she was in trigeminy when I listened during her exam. She is essentially asymptomatic from this but we will run it by Dr. Bernie. She continues to have PVC's fairly often but is being monitored by Dr. Krasowski.  Plan: She informed me that she takes her omeprazole  in the morning, Pepcid before bedtime, and uses mylanta as needed. She continues Zofran  4 mg Q8hrs prn for her nausea. I instructed her to increase the Zofran  dose to 8 mg every 8hrs and  that she can alternate this with the compazine . She also has had an heart  monitor on since 12/14/2023 and will have this removed tomorrow at Dr. Karry office.  Patient has an EGD scheduled on 11/21. With her increase in symptoms I believe that her cancer is becoming resistant to treatment. We discussed reviewing repeat scans, other treatment options, and reviewing the pathology of her EGD. I will schedule her a CT chest, abdomen, and pelvis on the 24th of this month. Her day 1 cycle 46 of FOLFOX and Nivolumab  is scheduled on 12/27/2023 and her day 3 pump D/C, cycle 46 is scheduled on 12/29/2023, most likely we will be stopping this regimen and moving on to new therapy. We know she had a mutation of Claudin 18 so we can use targeted therapy. She has a WBC of 6.1, low hemoglobin of 11.5 down from 11.8, and platelet count of 228,000. Her CMP is fairly normal other than an improved chronic elevated creatinine of 1.23 and slightly low total protein of 6.4. Her TSh and T4 level today is pending and I will add a lipid panel to her labs today for her PCP, Dr. Jefferey. She will receive weekly IV fluids and [name] back in 3 weeks with CBC, CMP, TSH, and T4. At that time we will discuss the test results and make decisions on treatment. The patient understands the plans discussed today and is in agreement.  She knows to contact our office if she develops concerns prior to her next appointment.  I provided 29 minutes of face-to-face time during this encounter and > 50% was spent counseling as documented under my assessment and plan.   Mackenzie VEAR Cornish, MD  [facility] - A DEPT OF [facility] [address] [place] [address] Dept: [phone] Dept Fax: [phone]   Orders Placed This Encounter  Procedures   Lipid panel    Standing Status:   Future    Number of Occurrences:   1    Expiration Date:   12/26/2024   TSH    Standing Status:   Future    Number of Occurrences:   1    Expiration Date:    12/26/2024    CHIEF COMPLAINT:  CC: Stage IVB gastric cancer  Current Treatment: 5-fluorouracil /leucovorin /nivolumab   HISTORY OF PRESENT ILLNESS:   Oncology History  Gastric cancer (HCC)  09/10/2021 Initial Diagnosis   Gastric cancer (HCC)   09/10/2021 Cancer Staging   Staging form: Stomach, AJCC 8th Edition - Clinical stage from 09/10/2021: Stage IVB (cT4b, cN0, cM1) - Signed by Lewis Mackenzie VEAR, MD on 09/10/2021 Histopathologic type: Adenocarcinoma, NOS Stage prefix: Initial diagnosis Total positive nodes: 0 Histologic grade (G): G3 Histologic grading system: 3 grade system Sites of metastasis: Peritoneal surface Diagnostic confirmation: Positive histology PLUS positive immunophenotyping and/or positive genetic studies Specimen type: Endoscopy with Biopsy Staged by: Managing physician Carcinoembryonic antigen (CEA) (ng/mL): 2.8 Carbohydrate antigen 19-9 (CA 19-9) (U/mL): 4.9 HER2 status: Unknown Microsatellite instability (MSI): Unknown Tumor location in stomach: Other Clinical staging modalities: Biopsy, Endoscopy Stage used in treatment planning: Yes National guidelines used in treatment planning: Yes Type of national guideline used in treatment planning: NCCN   09/28/2021 - 10/14/2021 Chemotherapy   Patient is on Treatment Plan : GASTROESOPHAGEAL FOLFOX + Nivolumab  q14d     09/28/2021 -  Chemotherapy   Patient is on Treatment Plan : GASTROESOPHAGEAL FOLFOX + Nivolumab  q21d (changed from q14d on 04/12/23)  12/09/2021 Genetic Testing   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  Report date is 12/09/2021.   The Multi-Cancer + RNA Panel offered by [facility] includes sequencing and/or deletion/duplication analysis of the following 84 genes:  AIP*, ALK, APC*, ATM*, AXIN2*, BAP1*, BARD1*, BLM*, BMPR1A*, BRCA1*, BRCA2*, BRIP1*, CASR, CDC73*, CDH1*, CDK4, CDKN1B*, CDKN1C*, CDKN2A, CEBPA, CHEK2*, CTNNA1*, DICER1*, DIS3L2*, EGFR, EPCAM,  FH*, FLCN*, GATA2*, GPC3, GREM1, HOXB13, HRAS, KIT, MAX*, MEN1*, MET, MITF, MLH1*, MSH2*, MSH3*, MSH6*, MUTYH*, NBN*, NF1*, NF2*, NTHL1*, PALB2*, PDGFRA, PHOX2B, PMS2*, POLD1*, POLE*, POT1*, PRKAR1A*, PTCH1*, PTEN*, RAD50*, RAD51C*, RAD51D*, RB1*, RECQL4, RET, RUNX1*, SDHA*, SDHAF2*, SDHB*, SDHC*, SDHD*, SMAD4*, SMARCA4*, SMARCB1*, SMARCE1*, STK11*, SUFU*, TERC, TERT, TMEM127*, Tp53*, TSC1*, TSC2*, VHL*, WRN*, and WT1.  RNA analysis is performed for * genes.     INTERVAL HISTORY:  Lorisa is here today for repeat clinical assessment for his stage IVB gastric cancer. Patient states that she doesn't feel the best and complains of nausea, vomiting, an gnawing stomach pain in the evenings, back and shoulder pain. She informed me that she takes her omeprazole  in the morning, Pepcid before bedtime, and uses mylanta as needed. She continues Zofran  4 mg Q8hrs prn for her nausea. I instructed her to increase the Zofran  dose to 8 mg every 8hrs and that she can alternate this with the compazine . She also has had an heart monitor on since 12/14/2023 and will have this removed tomorrow at Dr. Karry office.  Patient has an EGD scheduled on 11/21. With her increase in symptoms I believe that her cancer is becoming resistant to treatment. We discussed reviewing repeat scans, other treatment options, and reviewing the pathology of her EGD. I will schedule her a CT chest, abdomen, and pelvis on the 24th of this month. Her day 1 cycle 46 of FOLFOX and Nivolumab  is scheduled on 12/27/2023 and her day 3 pump D/C, cycle 46 is scheduled on 12/29/2023, most likely we will be stopping this regimen and moving on to new therapy. We know she had a mutation of Claudin 18 so we can use targeted therapy. She has a WBC of 6.1, low hemoglobin of 11.5 down from 11.8, and platelet count of 228,000. Her CMP is fairly normal other than an improved chronic elevated creatinine of 1.23 and slightly low total protein of 6.4. Her TSh and T4 level  today is pending and I will add a lipid panel to her labs today for her PCP, Dr. Jefferey. She will receive weekly IV fluids and [name] back in 3 weeks with CBC, CMP, TSH, and T4. At that time we will discuss the test results and make decisions on treatment.   She denies fever, chills, night sweats, or other signs of infection. She denies cardiorespiratory issues. Her appetite is decreased and Her weight has decreased 1 pounds over last 2 weeks. This patient is accompanied in the office by her daughter.   REVIEW OF SYSTEMS:  Review of Systems  Constitutional:  Positive for appetite change (early satiety, decreased appetite). Negative for chills, diaphoresis, fatigue, fever and unexpected weight change.  HENT:  Negative.  Negative for hearing loss, lump/mass, mouth sores, nosebleeds, sore throat, tinnitus, trouble swallowing and voice change.   Eyes: Negative.   Respiratory: Negative.  Negative for chest tightness, cough, hemoptysis, shortness of breath and wheezing.   Cardiovascular: Negative.  Negative for chest pain, leg swelling and palpitations.  Gastrointestinal:  Positive for abdominal pain (gnawing pain), diarrhea (improved), nausea (intermittent, mild) and  vomiting. Negative for abdominal distention, blood in stool, constipation and rectal pain.  Endocrine: Negative.  Negative for hot flashes.  Genitourinary:  Negative for bladder incontinence, difficulty urinating, dyspareunia, dysuria, frequency, hematuria, menstrual problem, nocturia, pelvic pain, vaginal bleeding and vaginal discharge.   Musculoskeletal:  Positive for arthralgias, back pain (aching pain) and gait problem (due to left foot and ankle pain). Negative for flank pain, myalgias, neck pain and neck stiffness.       Shoulder pain  Skin: Negative.  Negative for itching and rash.  Neurological:  Positive for dizziness, extremity weakness (generalized weakness), gait problem (due to left foot and ankle pain),  light-headedness and numbness (neuropathy feet). Negative for headaches, seizures and speech difficulty.  Hematological: Negative.  Negative for adenopathy. Does not bruise/bleed easily.  Psychiatric/Behavioral:  Positive for sleep disturbance. Negative for confusion, decreased concentration, depression and suicidal ideas. The patient is not nervous/anxious.   All other systems reviewed and are negative.   VITALS:  Blood pressure (!) 142/69, pulse 80, temperature 97.8 F (36.6 C), temperature source Oral, resp. rate 18, height 5' 4 (1.626 m), weight 210 lb 1.6 oz (95.3 kg), SpO2 100%.  Wt Readings from Last 3 Encounters:  12/29/23 211 lb (95.7 kg)  12/27/23 210 lb 1.6 oz (95.3 kg)  12/20/23 211 lb (95.7 kg)    Body mass index is 36.06 kg/m.  Performance status (ECOG): 1 - Symptomatic but completely ambulatory  PHYSICAL EXAM:  Physical Exam Vitals and nursing note reviewed. Exam conducted with a chaperone present.  Constitutional:      General: She is not in acute distress.    Appearance: Normal appearance. She is normal weight.  HENT:     Head: Normocephalic and atraumatic.     Right Ear: Tympanic membrane, ear canal and external ear normal. There is no impacted cerumen.     Left Ear: Tympanic membrane, ear canal and external ear normal. There is no impacted cerumen.     Nose: Nose normal. No congestion or rhinorrhea.     Mouth/Throat:     Mouth: Mucous membranes are moist.     Pharynx: Oropharynx is clear. No oropharyngeal exudate or posterior oropharyngeal erythema.  Eyes:     General: No scleral icterus.    Extraocular Movements: Extraocular movements intact.     Conjunctiva/sclera: Conjunctivae normal.     Pupils: Pupils are equal, round, and reactive to light.  Cardiovascular:     Rate and Rhythm: Normal rate and regular rhythm.     Pulses: Normal pulses.     Heart sounds: Normal heart sounds. No murmur heard.    No friction rub. No gallop.  Pulmonary:     Effort:  Pulmonary effort is normal.     Breath sounds: Normal breath sounds. No wheezing, rhonchi or rales.  Abdominal:     General: Bowel sounds are normal. There is no distension.     Palpations: Abdomen is soft. There is no hepatomegaly, splenomegaly or mass.     Tenderness: There is abdominal tenderness in the epigastric area.     Comments: Firmness in the upper mid abdomen, considerably increased.   Musculoskeletal:        General: Normal range of motion.     Cervical back: Normal range of motion and neck supple. No tenderness.     Right lower leg: Edema (mild) present.     Left lower leg: Edema (mild) present.     Left ankle: No swelling. No tenderness.  Lymphadenopathy:  Cervical: No cervical adenopathy.     Upper Body:     Right upper body: No supraclavicular or axillary adenopathy.     Left upper body: No supraclavicular or axillary adenopathy.     Lower Body: No right inguinal adenopathy. No left inguinal adenopathy.  Skin:    General: Skin is warm and dry.     Coloration: Skin is not jaundiced.     Findings: No rash.  Neurological:     Mental Status: She is alert and oriented to person, place, and time.     Cranial Nerves: No cranial nerve deficit.  Psychiatric:        Mood and Affect: Mood normal.        Behavior: Behavior normal.        Thought Content: Thought content normal.    LABS:      Latest Ref Rng & Units 12/27/2023    9:32 AM 12/06/2023    9:08 AM 11/15/2023    8:18 AM  CBC  WBC 4.0 - 10.5 K/uL 6.1  5.5  6.3   Hemoglobin 12.0 - 15.0 g/dL 88.4  88.1  88.2   Hematocrit 36.0 - 46.0 % 34.9  35.5  35.4   Platelets 150 - 400 K/uL 228  226  233       Latest Ref Rng & Units 12/27/2023    9:32 AM 12/06/2023    9:08 AM 11/15/2023    8:18 AM  CMP  Glucose 70 - 99 mg/dL 889  802  887   BUN 8 - 23 mg/dL 22  22  19    Creatinine 0.44 - 1.00 mg/dL 8.76  8.67  8.74   Sodium 135 - 145 mmol/L 139  138  137   Potassium 3.5 - 5.1 mmol/L 3.9  4.0  4.0   Chloride 98 -  111 mmol/L 104  103  102   CO2 22 - 32 mmol/L 25  21  22    Calcium  8.9 - 10.3 mg/dL 8.9  9.3  9.3   Total Protein 6.5 - 8.1 g/dL 6.4  6.5  6.4   Total Bilirubin 0.0 - 1.2 mg/dL 0.5  0.6  0.4   Alkaline Phos 38 - 126 U/L 97  97  98   AST 15 - 41 U/L 20  23  18    ALT 0 - 44 U/L 20  22  17     Lab Results  Component Value Date   TSH 3.130 12/27/2023   T4TOTAL 8.7 12/06/2023   Lab Results  Component Value Date   CEA1 2.9 09/10/2021   /  CEA  Date Value Ref Range Status  09/10/2021 2.9 0.0 - 4.7 ng/mL Final    Comment:    (NOTE)                             Nonsmokers          <3.9                             Smokers             <5.6 [employer] Electrochemiluminescence Immunoassay (ECLIA) Values obtained with different assay methods or kits cannot be used interchangeably.  Results cannot be interpreted as absolute evidence of the presence or absence of malignant disease. Performed At: [facility] [address] [place], [place] [id number] Jennette Shorter MD Ey:[phone]  STUDIES:  EXAM: 09/29/2023 CT CHEST, ABDOMEN, AND PELVIS WITH CONTRAST IMPRESSION: 1. Redemonstration of irregular moderate thickening of the distal stomach body/pylorus, grossly similar to the prior study. There is associated mild perigastric fat stranding and multiple subcentimeter sized lymph nodes, grossly similar to the prior study from 06/30/2023 but progressed since the PET scan from 03/22/2022. These are concerning for locoregional metastatic disease. 2. No new metastatic disease identified within the chest, abdomen or pelvis. 3. Multiple other nonacute observations, as described above.  Exam: 06/30/2023 CT CHEST ABDOMEN PELVIS W CONTRAST  IMPRESSION: Persistent nonspecific diffuse gastric wall thickening with persistent subtle peritoneal nodularity inferior to the gastric body and antrum. The possibility of early peritoneal carcinomatosis cannot entirely be excluded. No  convincing evidence for metastatic disease within the chest. Stable pulmonary nodules measuring up to 6 mm. Attention on follow-up.    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  If that happens, she is to call us  so that we can schedule her for an ultrasound to look for synovitis.     Essential hypertension 09/09/2016   Gastric cancer (HCC) 09/10/2021   Hyperlipidemia 09/01/2016   Hypertension    Hypothyroidism 09/01/2016   Osteoarthritis of both feet 01/20/2016   Osteoarthritis, hand 01/20/2016   Thyroid  disease     Past Surgical History:  Procedure Laterality Date   APPENDECTOMY     LAPAROSCOPIC APPENDECTOMY N/A 04/10/2016   Procedure: APPENDECTOMY LAPAROSCOPIC;  Surgeon: Vicenta Poli, MD;  Location: [facility] OR;  Service: General;  Laterality: N/A;    Family History  Problem Relation Age of Onset   Hypertension Mother    Prostate cancer Father        metastatic; d. 34   Brain cancer Sister 50   Breast cancer Sister 40   AAA (abdominal aortic aneurysm) Brother    Leukemia Cousin        x2 maternal female cousins; d. before 29   Breast cancer Daughter 50       DCIS    Social History:  reports that she has never smoked. She has never used smokeless tobacco. She reports that she does not currently use alcohol . She reports that she does not use drugs.The patient is alone today.  Allergies:  Allergies   Allergen Reactions   Doxycycline Rash   Sulfa Antibiotics Other (See Comments) and Rash    Other reaction(s): Other (See Comments)  Made me feel weird    Current Medications: Current Outpatient Medications  Medication Sig Dispense Refill   b complex vitamins capsule Take 1 capsule by mouth daily.     Calcium  Carbonate (CALCIUM  600 PO) Take 1 tablet by mouth daily.     Cholecalciferol (VITAMIN D3) 5000 units CAPS Take 1 capsule by mouth daily.     ELIQUIS  5 MG TABS tablet TAKE 1 TABLET TWICE A DAY 180 tablet 3   famotidine (PEPCID) 40 MG tablet Take 40 mg by mouth at bedtime.  hydrochlorothiazide (HYDRODIURIL) 25 MG tablet Take 25 mg by mouth daily.     KRILL OIL PO Take 1 capsule by mouth daily. Unknown strenght     Lactobacillus TABS Take 1 tablet by mouth 2 (two) times daily.     levothyroxine  (SYNTHROID , LEVOTHROID) 75 MCG tablet Take 75 mcg by mouth daily before breakfast.      loratadine (CLARITIN) 10 MG tablet Take 10 mg by mouth daily.     LORazepam  (ATIVAN ) 0.5 MG tablet Take 1 tablet (0.5 mg total) by mouth every 8 (eight) hours. 50 tablet 0   metoprolol  succinate (TOPROL  XL) 25 MG 24 hr tablet Take 1 tablet (25 mg total) by mouth daily. 90 tablet 3   NON FORMULARY Take 1 Dose by mouth See admin instructions. MMW: 3 parts Maalox 2 parts Benadryl  1 part viscious lidicaine  Disp. 6oz  Instructions: 5ml swish and swallow every 3-4 hours     omeprazole  (PRILOSEC) 40 MG capsule Take 1 capsule (40 mg total) by mouth 2 (two) times daily. 60 capsule 5   ondansetron  (ZOFRAN -ODT) 4 MG disintegrating tablet Take 1 tablet (4 mg total) by mouth every 8 (eight) hours as needed for nausea or vomiting. 90 tablet 3   polyethylene glycol powder (GLYCOLAX /MIRALAX ) 17 GM/SCOOP powder Take 1 Container by mouth daily.     potassium chloride  SA (KLOR-CON  M) 20 MEQ tablet TAKE 1 TABLET TWICE A DAY 180 tablet 3   pravastatin  (PRAVACHOL ) 20 MG tablet TAKE 1 TABLET BY MOUTH EVERY DAY IN THE  EVENING 90 tablet 2   Probiotic Product (PROBIOTIC DAILY PO) Take 1 tablet by mouth daily.     prochlorperazine  (COMPAZINE ) 10 MG tablet Take 1 tablet (10 mg total) by mouth every 6 (six) hours as needed for nausea or vomiting. 90 tablet 3   valsartan (DIOVAN) 160 MG tablet Take 160 mg by mouth daily.     No current facility-administered medications for this visit.    I,Jasmine M Lassiter,acting as a scribe for Mackenzie VEAR Cornish, MD.,have documented all relevant documentation on the behalf of Mackenzie VEAR Cornish, MD,as directed by  Mackenzie VEAR Cornish, MD while in the presence of Mackenzie VEAR Cornish, MD.

## 2023-12-27 NOTE — Patient Instructions (Signed)
Fluorouracil Injection What is this medication? FLUOROURACIL (flure oh YOOR a sil) treats some types of cancer. It works by slowing down the growth of cancer cells. This medicine may be used for other purposes; ask your health care provider or pharmacist if you have questions. COMMON BRAND NAME(S): Adrucil What should I tell my care team before I take this medication? They need to know if you have any of these conditions: Blood disorders Dihydropyrimidine dehydrogenase (DPD) deficiency Infection, such as chickenpox, cold sores, herpes Kidney disease Liver disease Poor nutrition Recent or ongoing radiation therapy An unusual or allergic reaction to fluorouracil, other medications, foods, dyes, or preservatives If you or your partner are pregnant or trying to get pregnant Breast-feeding How should I use this medication? This medication is injected into a vein. It is administered by your care team in a hospital or clinic setting. Talk to your care team about the use of this medication in children. Special care may be needed. Overdosage: If you think you have taken too much of this medicine contact a poison control center or emergency room at once. NOTE: This medicine is only for you. Do not share this medicine with others. What if I miss a dose? Keep appointments for follow-up doses. It is important not to miss your dose. Call your care team if you are unable to keep an appointment. What may interact with this medication? Do not take this medication with any of the following: Live virus vaccines This medication may also interact with the following: Medications that treat or prevent blood clots, such as warfarin, enoxaparin, dalteparin This list may not describe all possible interactions. Give your health care provider a list of all the medicines, herbs, non-prescription drugs, or dietary supplements you use. Also tell them if you smoke, drink alcohol, or use illegal drugs. Some items may  interact with your medicine. What should I watch for while using this medication? Your condition will be monitored carefully while you are receiving this medication. This medication may make you feel generally unwell. This is not uncommon as chemotherapy can affect healthy cells as well as cancer cells. Report any side effects. Continue your course of treatment even though you feel ill unless your care team tells you to stop. In some cases, you may be given additional medications to help with side effects. Follow all directions for their use. This medication may increase your risk of getting an infection. Call your care team for advice if you get a fever, chills, sore throat, or other symptoms of a cold or flu. Do not treat yourself. Try to avoid being around people who are sick. This medication may increase your risk to bruise or bleed. Call your care team if you notice any unusual bleeding. Be careful brushing or flossing your teeth or using a toothpick because you may get an infection or bleed more easily. If you have any dental work done, tell your dentist you are receiving this medication. Avoid taking medications that contain aspirin, acetaminophen, ibuprofen, naproxen, or ketoprofen unless instructed by your care team. These medications may hide a fever. Do not treat diarrhea with over the counter products. Contact your care team if you have diarrhea that lasts more than 2 days or if it is severe and watery. This medication can make you more sensitive to the sun. Keep out of the sun. If you cannot avoid being in the sun, wear protective clothing and sunscreen. Do not use sun lamps, tanning beds, or tanning booths. Talk to   your care team if you or your partner wish to become pregnant or think you might be pregnant. This medication can cause serious birth defects if taken during pregnancy and for 3 months after the last dose. A reliable form of contraception is recommended while taking this  medication and for 3 months after the last dose. Talk to your care team about effective forms of contraception. Do not father a child while taking this medication and for 3 months after the last dose. Use a condom while having sex during this time period. Do not breastfeed while taking this medication. This medication may cause infertility. Talk to your care team if you are concerned about your fertility. What side effects may I notice from receiving this medication? Side effects that you should report to your care team as soon as possible: Allergic reactions--skin rash, itching, hives, swelling of the face, lips, tongue, or throat Heart attack--pain or tightness in the chest, shoulders, arms, or jaw, nausea, shortness of breath, cold or clammy skin, feeling faint or lightheaded Heart failure--shortness of breath, swelling of the ankles, feet, or hands, sudden weight gain, unusual weakness or fatigue Heart rhythm changes--fast or irregular heartbeat, dizziness, feeling faint or lightheaded, chest pain, trouble breathing High ammonia level--unusual weakness or fatigue, confusion, loss of appetite, nausea, vomiting, seizures Infection--fever, chills, cough, sore throat, wounds that don't heal, pain or trouble when passing urine, general feeling of discomfort or being unwell Low red blood cell level--unusual weakness or fatigue, dizziness, headache, trouble breathing Pain, tingling, or numbness in the hands or feet, muscle weakness, change in vision, confusion or trouble speaking, loss of balance or coordination, trouble walking, seizures Redness, swelling, and blistering of the skin over hands and feet Severe or prolonged diarrhea Unusual bruising or bleeding Side effects that usually do not require medical attention (report to your care team if they continue or are bothersome): Dry skin Headache Increased tears Nausea Pain, redness, or swelling with sores inside the mouth or throat Sensitivity  to light Vomiting This list may not describe all possible side effects. Call your doctor for medical advice about side effects. You may report side effects to FDA at 1-800-FDA-1088. Where should I keep my medication? This medication is given in a hospital or clinic. It will not be stored at home. NOTE: This sheet is a summary. It may not cover all possible information. If you have questions about this medicine, talk to your doctor, pharmacist, or health care provider.  2024 [employer] Standard (2021-06-09 00:00:00) Leucovorin Injection What is this medication? LEUCOVORIN (loo koe VOR in) prevents side effects from certain medications, such as methotrexate. It works by increasing folate levels. This helps protect healthy cells in your body. It may also be used to treat anemia caused by low levels of folate. It can also be used with fluorouracil, a type of chemotherapy, to treat colorectal cancer. It works by increasing the effects of fluorouracil in the body. This medicine may be used for other purposes; ask your health care provider or pharmacist if you have questions. What should I tell my care team before I take this medication? They need to know if you have any of these conditions: Anemia from low levels of vitamin B12 in the blood An unusual or allergic reaction to leucovorin, folic acid, other medications, foods, dyes, or preservatives Pregnant or trying to get pregnant Breastfeeding How should I use this medication? This medication is injected into a vein or a muscle. It is given by your care team   in a hospital or clinic setting. Talk to your care team about the use of this medication in children. Special care may be needed. Overdosage: If you think you have taken too much of this medicine contact a poison control center or emergency room at once. NOTE: This medicine is only for you. Do not share this medicine with others. What if I miss a dose? Keep appointments for follow-up doses.  It is important not to miss your dose. Call your care team if you are unable to keep an appointment. What may interact with this medication? Capecitabine Fluorouracil Phenobarbital Phenytoin Primidone Trimethoprim;sulfamethoxazole This list may not describe all possible interactions. Give your health care provider a list of all the medicines, herbs, non-prescription drugs, or dietary supplements you use. Also tell them if you smoke, drink alcohol, or use illegal drugs. Some items may interact with your medicine. What should I watch for while using this medication? Your condition will be monitored carefully while you are receiving this medication. This medication may increase the side effects of 5-fluorouracil. Tell your care team if you have diarrhea or mouth sores that do not get better or that get worse. What side effects may I notice from receiving this medication? Side effects that you should report to your care team as soon as possible: Allergic reactions--skin rash, itching, hives, swelling of the face, lips, tongue, or throat This list may not describe all possible side effects. Call your doctor for medical advice about side effects. You may report side effects to FDA at 1-800-FDA-1088. Where should I keep my medication? This medication is given in a hospital or clinic. It will not be stored at home. NOTE: This sheet is a summary. It may not cover all possible information. If you have questions about this medicine, talk to your doctor, pharmacist, or health care provider.  2024 [employer] Standard (2021-07-07 00:00:00) Nivolumab Injection What is this medication? NIVOLUMAB (nye VOL ue mab) treats some types of cancer. It works by helping your immune system slow or stop the spread of cancer cells. It is a monoclonal antibody. This medicine may be used for other purposes; ask your health care provider or pharmacist if you have questions. COMMON BRAND NAME(S): Opdivo What should I  tell my care team before I take this medication? They need to know if you have any of these conditions: Allogeneic stem cell transplant (uses someone else's stem cells) Autoimmune diseases, such as Crohn disease, ulcerative colitis, lupus History of chest radiation Nervous system problems, such as Guillain-Barre syndrome or myasthenia gravis Organ transplant An unusual or allergic reaction to nivolumab, other medications, foods, dyes, or preservatives Pregnant or trying to get pregnant Breast-feeding How should I use this medication? This medication is infused into a vein. It is given in a hospital or clinic setting. A special MedGuide will be given to you before each treatment. Be sure to read this information carefully each time. Talk to your care team about the use of this medication in children. While it may be prescribed for children as young as 12 years for selected conditions, precautions do apply. Overdosage: If you think you have taken too much of this medicine contact a poison control center or emergency room at once. NOTE: This medicine is only for you. Do not share this medicine with others. What if I miss a dose? Keep appointments for follow-up doses. It is important not to miss your dose. Call your care team if you are unable to keep an appointment. What may   interact with this medication? Interactions have not been studied. This list may not describe all possible interactions. Give your health care provider a list of all the medicines, herbs, non-prescription drugs, or dietary supplements you use. Also tell them if you smoke, drink alcohol, or use illegal drugs. Some items may interact with your medicine. What should I watch for while using this medication? Your condition will be monitored carefully while you are receiving this medication. You may need blood work while taking this medication. This medication may cause serious skin reactions. They can happen weeks to months after  starting the medication. Contact your care team right away if you notice fevers or flu-like symptoms with a rash. The rash may be red or purple and then turn into blisters or peeling of the skin. You may also notice a red rash with swelling of the face, lips, or lymph nodes in your neck or under your arms. Tell your care team right away if you have any change in your eyesight. Talk to your care team if you are pregnant or think you might be pregnant. A negative pregnancy test is required before starting this medication. A reliable form of contraception is recommended while taking this medication and for 5 months after the last dose. Talk to your care team about effective forms of contraception. Do not breast-feed while taking this medication and for 5 months after the last dose. What side effects may I notice from receiving this medication? Side effects that you should report to your care team as soon as possible: Allergic reactions--skin rash, itching, hives, swelling of the face, lips, tongue, or throat Dry cough, shortness of breath or trouble breathing Eye pain, redness, irritation, or discharge with blurry or decreased vision Heart muscle inflammation--unusual weakness or fatigue, shortness of breath, chest pain, fast or irregular heartbeat, dizziness, swelling of the ankles, feet, or hands Hormone gland problems--headache, sensitivity to light, unusual weakness or fatigue, dizziness, fast or irregular heartbeat, increased sensitivity to cold or heat, excessive sweating, constipation, hair loss, increased thirst or amount of urine, tremors or shaking, irritability Infusion reactions--chest pain, shortness of breath or trouble breathing, feeling faint or lightheaded Kidney injury (glomerulonephritis)--decrease in the amount of urine, red or dark brown urine, foamy or bubbly urine, swelling of the ankles, hands, or feet Liver injury--right upper belly pain, loss of appetite, nausea, light-colored  stool, dark yellow or brown urine, yellowing skin or eyes, unusual weakness or fatigue Pain, tingling, or numbness in the hands or feet, muscle weakness, change in vision, confusion or trouble speaking, loss of balance or coordination, trouble walking, seizures Rash, fever, and swollen lymph nodes Redness, blistering, peeling, or loosening of the skin, including inside the mouth Sudden or severe stomach pain, bloody diarrhea, fever, nausea, vomiting Side effects that usually do not require medical attention (report these to your care team if they continue or are bothersome): Bone, joint, or muscle pain Diarrhea Fatigue Loss of appetite Nausea Skin rash This list may not describe all possible side effects. Call your doctor for medical advice about side effects. You may report side effects to FDA at 1-800-FDA-1088. Where should I keep my medication? This medication is given in a hospital or clinic. It will not be stored at home. NOTE: This sheet is a summary. It may not cover all possible information. If you have questions about this medicine, talk to your doctor, pharmacist, or health care provider.  2024 [employer] Standard (2021-06-01 00:00:00)  

## 2023-12-28 ENCOUNTER — Inpatient Hospital Stay

## 2023-12-28 ENCOUNTER — Other Ambulatory Visit: Payer: Self-pay

## 2023-12-28 DIAGNOSIS — E038 Other specified hypothyroidism: Secondary | ICD-10-CM

## 2023-12-28 DIAGNOSIS — E785 Hyperlipidemia, unspecified: Secondary | ICD-10-CM

## 2023-12-28 LAB — LIPID PANEL
Cholesterol: 167 mg/dL (ref 0–200)
HDL: 59 mg/dL (ref >40)
LDL Cholesterol: 78 mg/dL (ref 0–99)
Total CHOL/HDL Ratio: 2.8 ratio
Triglycerides: 151 mg/dL — ABNORMAL HIGH (ref <150)
VLDL: 30 mg/dL (ref 0–40)

## 2023-12-28 LAB — TSH: TSH: 3.13 u[IU]/mL (ref 0.350–4.500)

## 2023-12-29 ENCOUNTER — Inpatient Hospital Stay

## 2023-12-29 VITALS — BP 143/70 | HR 78 | Temp 98.0°F | Resp 18 | Ht 64.0 in | Wt 211.0 lb

## 2023-12-29 DIAGNOSIS — R918 Other nonspecific abnormal finding of lung field: Secondary | ICD-10-CM | POA: Diagnosis not present

## 2023-12-29 DIAGNOSIS — D649 Anemia, unspecified: Secondary | ICD-10-CM | POA: Diagnosis not present

## 2023-12-29 DIAGNOSIS — R7989 Other specified abnormal findings of blood chemistry: Secondary | ICD-10-CM

## 2023-12-29 DIAGNOSIS — Z5111 Encounter for antineoplastic chemotherapy: Secondary | ICD-10-CM | POA: Diagnosis not present

## 2023-12-29 DIAGNOSIS — C169 Malignant neoplasm of stomach, unspecified: Secondary | ICD-10-CM | POA: Diagnosis not present

## 2023-12-29 DIAGNOSIS — N182 Chronic kidney disease, stage 2 (mild): Secondary | ICD-10-CM | POA: Diagnosis not present

## 2023-12-29 DIAGNOSIS — E876 Hypokalemia: Secondary | ICD-10-CM

## 2023-12-29 DIAGNOSIS — E860 Dehydration: Secondary | ICD-10-CM

## 2023-12-29 DIAGNOSIS — I129 Hypertensive chronic kidney disease with stage 1 through stage 4 chronic kidney disease, or unspecified chronic kidney disease: Secondary | ICD-10-CM | POA: Diagnosis not present

## 2023-12-29 MED ORDER — SODIUM CHLORIDE 0.9 % IV SOLN: Freq: Once | INTRAVENOUS | Status: AC

## 2023-12-29 NOTE — Patient Instructions (Signed)
 Dehydration, Adult Dehydration is a condition in which there is not enough water or other fluids in the body. This happens when a person loses more fluids than they take in. Important organs cannot work right without the right amount of fluids. Any loss of fluids from the body can cause dehydration. Dehydration can be mild, worse, or very bad. It should be treated right away to keep it from getting very bad. What are the causes? Conditions that cause loss of water in the body. They include: Watery poop (diarrhea). Vomiting. Sweating a lot. Fever. Infection. Peeing (urinating) a lot. Not drinking enough fluids. Certain medicines, such as medicines that take extra fluid out of the body (diuretics). Lack of safe drinking water. Not being able to get enough water and food. What increases the risk? Having a long-term (chronic) illness that has not been treated the right way, such as: Diabetes. Heart disease. Kidney disease. Being 25 years of age or older. Having a disability. Living in a place that is high above the ground or sea (high in altitude). The thinner, drier air causes more fluid loss. Doing exercises that put stress on your body for a long time. Being active when in hot places. What are the signs or symptoms? Symptoms of dehydration depend on how bad it is. Mild or worse dehydration Thirst. Dry lips or dry mouth. Feeling dizzy or light-headed. Muscle cramps. Passing little pee or dark pee. Pee may be the color of tea. Headache. Very bad dehydration Changes in skin. Skin may: Be cold to the touch (clammy). Be blotchy or pale. Not go back to normal right after you pinch it and let it go. Little or no tears, pee, or sweat. Fast breathing. Low blood pressure. Weak pulse. Pulse that is more than 100 beats a minute when you are sitting still. Other changes, such as: Feeling very thirsty. Eyes that look hollow (sunken). Cold hands and feet. Being confused. Being very  tired (lethargic) or having trouble waking from sleep. Losing weight. Loss of consciousness. How is this treated? Treatment for this condition depends on how bad your dehydration is. Treatment should start right away. Do not wait until your condition gets very bad. Very bad dehydration is an emergency. You will need to go to a hospital. Mild or worse dehydration can be treated at home. You may be asked to: Drink more fluids. Drink an oral rehydration solution (ORS). This drink gives you the right amount of fluids, salts, and minerals (electrolytes). Very bad dehydration can be treated: With fluids through an IV tube. By correcting low levels of electrolytes in the body. By treating the problem that caused your dehydration. Follow these instructions at home: Oral rehydration solution If told by your doctor, drink an ORS: Make an ORS. Use instructions on the package. Start by drinking small amounts, about  cup (120 mL) every 5-10 minutes. Slowly drink more until you have had the amount that your doctor said to have.  Eating and drinking  Drink enough clear fluid to keep your pee pale yellow. If you were told to drink an ORS, finish the ORS first. Then, start slowly drinking other clear fluids. Drink fluids such as: Water. Do not drink only water. Doing that can make the salt (sodium) level in your body get too low. Water from ice chips you suck on. Fruit juice that you have added water to (diluted). Low-calorie sports drinks. Eat foods that have the right amounts of salts and minerals, such as bananas, oranges, potatoes,  tomatoes, or spinach. Do not drink alcohol. Avoid drinks that have caffeine or sugar. These include:: High-calorie sports drinks. Fruit juice that you did not add water to. Soda. Coffee or energy drinks. Avoid foods that are greasy or have a lot of fat or sugar. General instructions Take over-the-counter and prescription medicines only as told by your doctor. Do  not take sodium tablets. Doing that can make the salt level in your body get too high. Return to your normal activities as told by your doctor. Ask your doctor what activities are safe for you. Keep all follow-up visits. Your doctor may check and change your treatment. Contact a doctor if: You have pain in your belly (abdomen) and the pain: Gets worse. Stays in one place. You have a rash. You have a stiff neck. You get angry or annoyed more easily than normal. You are more tired or have a harder time waking than normal. You feel weak or dizzy. You feel very thirsty. Get help right away if: You have any symptoms of very bad dehydration. You vomit every time you eat or drink. Your vomiting gets worse, does not go away, or you vomit blood or green stuff. You are getting treatment, but symptoms are getting worse. You have a fever. You have a very bad headache. You have: Diarrhea that gets worse or does not go away. Blood in your poop (stool). This may cause poop to look black and tarry. No pee in 6-8 hours. Only a small amount of pee in 6-8 hours, and the pee is very dark. You have trouble breathing. These symptoms may be an emergency. Get help right away. Call 911. Do not wait to see if the symptoms will go away. Do not drive yourself to the hospital. This information is not intended to replace advice given to you by your health care provider. Make sure you discuss any questions you have with your health care provider. Document Revised: 08/31/2021 Document Reviewed: 08/31/2021 Elsevier Patient Education  2024 [employer].

## 2024-01-02 DIAGNOSIS — R002 Palpitations: Secondary | ICD-10-CM | POA: Diagnosis not present

## 2024-01-03 ENCOUNTER — Inpatient Hospital Stay

## 2024-01-03 VITALS — BP 137/59 | HR 88 | Temp 98.4°F | Resp 18

## 2024-01-03 DIAGNOSIS — Z5111 Encounter for antineoplastic chemotherapy: Secondary | ICD-10-CM | POA: Diagnosis not present

## 2024-01-03 DIAGNOSIS — E860 Dehydration: Secondary | ICD-10-CM

## 2024-01-03 DIAGNOSIS — R7989 Other specified abnormal findings of blood chemistry: Secondary | ICD-10-CM

## 2024-01-03 DIAGNOSIS — N182 Chronic kidney disease, stage 2 (mild): Secondary | ICD-10-CM | POA: Diagnosis not present

## 2024-01-03 DIAGNOSIS — C168 Malignant neoplasm of overlapping sites of stomach: Secondary | ICD-10-CM

## 2024-01-03 DIAGNOSIS — C169 Malignant neoplasm of stomach, unspecified: Secondary | ICD-10-CM | POA: Diagnosis not present

## 2024-01-03 DIAGNOSIS — D649 Anemia, unspecified: Secondary | ICD-10-CM | POA: Diagnosis not present

## 2024-01-03 DIAGNOSIS — E876 Hypokalemia: Secondary | ICD-10-CM

## 2024-01-03 DIAGNOSIS — R918 Other nonspecific abnormal finding of lung field: Secondary | ICD-10-CM | POA: Diagnosis not present

## 2024-01-03 DIAGNOSIS — I129 Hypertensive chronic kidney disease with stage 1 through stage 4 chronic kidney disease, or unspecified chronic kidney disease: Secondary | ICD-10-CM | POA: Diagnosis not present

## 2024-01-03 MED ORDER — SODIUM CHLORIDE 0.9 % IV SOLN: Freq: Once | INTRAVENOUS | Status: AC

## 2024-01-03 NOTE — Patient Instructions (Signed)
 Dehydration, Adult Dehydration is a condition in which there is not enough water or other fluids in the body. This happens when a person loses more fluids than they take in. Important organs cannot work right without the right amount of fluids. Any loss of fluids from the body can cause dehydration. Dehydration can be mild, worse, or very bad. It should be treated right away to keep it from getting very bad. What are the causes? Conditions that cause loss of water in the body. They include: Watery poop (diarrhea). Vomiting. Sweating a lot. Fever. Infection. Peeing (urinating) a lot. Not drinking enough fluids. Certain medicines, such as medicines that take extra fluid out of the body (diuretics). Lack of safe drinking water. Not being able to get enough water and food. What increases the risk? Having a long-term (chronic) illness that has not been treated the right way, such as: Diabetes. Heart disease. Kidney disease. Being 25 years of age or older. Having a disability. Living in a place that is high above the ground or sea (high in altitude). The thinner, drier air causes more fluid loss. Doing exercises that put stress on your body for a long time. Being active when in hot places. What are the signs or symptoms? Symptoms of dehydration depend on how bad it is. Mild or worse dehydration Thirst. Dry lips or dry mouth. Feeling dizzy or light-headed. Muscle cramps. Passing little pee or dark pee. Pee may be the color of tea. Headache. Very bad dehydration Changes in skin. Skin may: Be cold to the touch (clammy). Be blotchy or pale. Not go back to normal right after you pinch it and let it go. Little or no tears, pee, or sweat. Fast breathing. Low blood pressure. Weak pulse. Pulse that is more than 100 beats a minute when you are sitting still. Other changes, such as: Feeling very thirsty. Eyes that look hollow (sunken). Cold hands and feet. Being confused. Being very  tired (lethargic) or having trouble waking from sleep. Losing weight. Loss of consciousness. How is this treated? Treatment for this condition depends on how bad your dehydration is. Treatment should start right away. Do not wait until your condition gets very bad. Very bad dehydration is an emergency. You will need to go to a hospital. Mild or worse dehydration can be treated at home. You may be asked to: Drink more fluids. Drink an oral rehydration solution (ORS). This drink gives you the right amount of fluids, salts, and minerals (electrolytes). Very bad dehydration can be treated: With fluids through an IV tube. By correcting low levels of electrolytes in the body. By treating the problem that caused your dehydration. Follow these instructions at home: Oral rehydration solution If told by your doctor, drink an ORS: Make an ORS. Use instructions on the package. Start by drinking small amounts, about  cup (120 mL) every 5-10 minutes. Slowly drink more until you have had the amount that your doctor said to have.  Eating and drinking  Drink enough clear fluid to keep your pee pale yellow. If you were told to drink an ORS, finish the ORS first. Then, start slowly drinking other clear fluids. Drink fluids such as: Water. Do not drink only water. Doing that can make the salt (sodium) level in your body get too low. Water from ice chips you suck on. Fruit juice that you have added water to (diluted). Low-calorie sports drinks. Eat foods that have the right amounts of salts and minerals, such as bananas, oranges, potatoes,  tomatoes, or spinach. Do not drink alcohol. Avoid drinks that have caffeine or sugar. These include:: High-calorie sports drinks. Fruit juice that you did not add water to. Soda. Coffee or energy drinks. Avoid foods that are greasy or have a lot of fat or sugar. General instructions Take over-the-counter and prescription medicines only as told by your doctor. Do  not take sodium tablets. Doing that can make the salt level in your body get too high. Return to your normal activities as told by your doctor. Ask your doctor what activities are safe for you. Keep all follow-up visits. Your doctor may check and change your treatment. Contact a doctor if: You have pain in your belly (abdomen) and the pain: Gets worse. Stays in one place. You have a rash. You have a stiff neck. You get angry or annoyed more easily than normal. You are more tired or have a harder time waking than normal. You feel weak or dizzy. You feel very thirsty. Get help right away if: You have any symptoms of very bad dehydration. You vomit every time you eat or drink. Your vomiting gets worse, does not go away, or you vomit blood or green stuff. You are getting treatment, but symptoms are getting worse. You have a fever. You have a very bad headache. You have: Diarrhea that gets worse or does not go away. Blood in your poop (stool). This may cause poop to look black and tarry. No pee in 6-8 hours. Only a small amount of pee in 6-8 hours, and the pee is very dark. You have trouble breathing. These symptoms may be an emergency. Get help right away. Call 911. Do not wait to see if the symptoms will go away. Do not drive yourself to the hospital. This information is not intended to replace advice given to you by your health care provider. Make sure you discuss any questions you have with your health care provider. Document Revised: 08/31/2021 Document Reviewed: 08/31/2021 Elsevier Patient Education  2024 [employer].

## 2024-01-05 DIAGNOSIS — H26491 Other secondary cataract, right eye: Secondary | ICD-10-CM | POA: Diagnosis not present

## 2024-01-05 DIAGNOSIS — H40013 Open angle with borderline findings, low risk, bilateral: Secondary | ICD-10-CM | POA: Diagnosis not present

## 2024-01-06 DIAGNOSIS — K219 Gastro-esophageal reflux disease without esophagitis: Secondary | ICD-10-CM | POA: Diagnosis not present

## 2024-01-06 DIAGNOSIS — Z85028 Personal history of other malignant neoplasm of stomach: Secondary | ICD-10-CM | POA: Diagnosis not present

## 2024-01-06 DIAGNOSIS — K222 Esophageal obstruction: Secondary | ICD-10-CM | POA: Diagnosis not present

## 2024-01-06 DIAGNOSIS — I10 Essential (primary) hypertension: Secondary | ICD-10-CM | POA: Diagnosis not present

## 2024-01-06 DIAGNOSIS — Z7901 Long term (current) use of anticoagulants: Secondary | ICD-10-CM | POA: Diagnosis not present

## 2024-01-06 DIAGNOSIS — Z882 Allergy status to sulfonamides status: Secondary | ICD-10-CM | POA: Diagnosis not present

## 2024-01-06 DIAGNOSIS — Z79899 Other long term (current) drug therapy: Secondary | ICD-10-CM | POA: Diagnosis not present

## 2024-01-06 DIAGNOSIS — G4733 Obstructive sleep apnea (adult) (pediatric): Secondary | ICD-10-CM | POA: Diagnosis not present

## 2024-01-06 DIAGNOSIS — R1310 Dysphagia, unspecified: Secondary | ICD-10-CM | POA: Diagnosis not present

## 2024-01-06 DIAGNOSIS — C169 Malignant neoplasm of stomach, unspecified: Secondary | ICD-10-CM | POA: Diagnosis not present

## 2024-01-06 DIAGNOSIS — Z881 Allergy status to other antibiotic agents status: Secondary | ICD-10-CM | POA: Diagnosis not present

## 2024-01-06 DIAGNOSIS — K259 Gastric ulcer, unspecified as acute or chronic, without hemorrhage or perforation: Secondary | ICD-10-CM | POA: Diagnosis not present

## 2024-01-09 ENCOUNTER — Inpatient Hospital Stay

## 2024-01-09 ENCOUNTER — Encounter: Payer: Self-pay | Admitting: Oncology

## 2024-01-09 VITALS — BP 125/59 | HR 89 | Temp 98.0°F | Resp 18

## 2024-01-09 DIAGNOSIS — R7989 Other specified abnormal findings of blood chemistry: Secondary | ICD-10-CM

## 2024-01-09 DIAGNOSIS — E860 Dehydration: Secondary | ICD-10-CM

## 2024-01-09 DIAGNOSIS — R918 Other nonspecific abnormal finding of lung field: Secondary | ICD-10-CM | POA: Diagnosis not present

## 2024-01-09 DIAGNOSIS — N182 Chronic kidney disease, stage 2 (mild): Secondary | ICD-10-CM | POA: Diagnosis not present

## 2024-01-09 DIAGNOSIS — I129 Hypertensive chronic kidney disease with stage 1 through stage 4 chronic kidney disease, or unspecified chronic kidney disease: Secondary | ICD-10-CM | POA: Diagnosis not present

## 2024-01-09 DIAGNOSIS — E876 Hypokalemia: Secondary | ICD-10-CM

## 2024-01-09 DIAGNOSIS — C169 Malignant neoplasm of stomach, unspecified: Secondary | ICD-10-CM | POA: Diagnosis not present

## 2024-01-09 DIAGNOSIS — Z5111 Encounter for antineoplastic chemotherapy: Secondary | ICD-10-CM | POA: Diagnosis not present

## 2024-01-09 DIAGNOSIS — D649 Anemia, unspecified: Secondary | ICD-10-CM | POA: Diagnosis not present

## 2024-01-09 MED ORDER — SODIUM CHLORIDE 0.9 % IV SOLN: Freq: Once | INTRAVENOUS | Status: AC

## 2024-01-09 NOTE — Patient Instructions (Signed)
 Dehydration, Adult Dehydration is a condition in which there is not enough water or other fluids in the body. This happens when a person loses more fluids than they take in. Important organs cannot work right without the right amount of fluids. Any loss of fluids from the body can cause dehydration. Dehydration can be mild, worse, or very bad. It should be treated right away to keep it from getting very bad. What are the causes? Conditions that cause loss of water in the body. They include: Watery poop (diarrhea). Vomiting. Sweating a lot. Fever. Infection. Peeing (urinating) a lot. Not drinking enough fluids. Certain medicines, such as medicines that take extra fluid out of the body (diuretics). Lack of safe drinking water. Not being able to get enough water and food. What increases the risk? Having a long-term (chronic) illness that has not been treated the right way, such as: Diabetes. Heart disease. Kidney disease. Being 25 years of age or older. Having a disability. Living in a place that is high above the ground or sea (high in altitude). The thinner, drier air causes more fluid loss. Doing exercises that put stress on your body for a long time. Being active when in hot places. What are the signs or symptoms? Symptoms of dehydration depend on how bad it is. Mild or worse dehydration Thirst. Dry lips or dry mouth. Feeling dizzy or light-headed. Muscle cramps. Passing little pee or dark pee. Pee may be the color of tea. Headache. Very bad dehydration Changes in skin. Skin may: Be cold to the touch (clammy). Be blotchy or pale. Not go back to normal right after you pinch it and let it go. Little or no tears, pee, or sweat. Fast breathing. Low blood pressure. Weak pulse. Pulse that is more than 100 beats a minute when you are sitting still. Other changes, such as: Feeling very thirsty. Eyes that look hollow (sunken). Cold hands and feet. Being confused. Being very  tired (lethargic) or having trouble waking from sleep. Losing weight. Loss of consciousness. How is this treated? Treatment for this condition depends on how bad your dehydration is. Treatment should start right away. Do not wait until your condition gets very bad. Very bad dehydration is an emergency. You will need to go to a hospital. Mild or worse dehydration can be treated at home. You may be asked to: Drink more fluids. Drink an oral rehydration solution (ORS). This drink gives you the right amount of fluids, salts, and minerals (electrolytes). Very bad dehydration can be treated: With fluids through an IV tube. By correcting low levels of electrolytes in the body. By treating the problem that caused your dehydration. Follow these instructions at home: Oral rehydration solution If told by your doctor, drink an ORS: Make an ORS. Use instructions on the package. Start by drinking small amounts, about  cup (120 mL) every 5-10 minutes. Slowly drink more until you have had the amount that your doctor said to have.  Eating and drinking  Drink enough clear fluid to keep your pee pale yellow. If you were told to drink an ORS, finish the ORS first. Then, start slowly drinking other clear fluids. Drink fluids such as: Water. Do not drink only water. Doing that can make the salt (sodium) level in your body get too low. Water from ice chips you suck on. Fruit juice that you have added water to (diluted). Low-calorie sports drinks. Eat foods that have the right amounts of salts and minerals, such as bananas, oranges, potatoes,  tomatoes, or spinach. Do not drink alcohol. Avoid drinks that have caffeine or sugar. These include:: High-calorie sports drinks. Fruit juice that you did not add water to. Soda. Coffee or energy drinks. Avoid foods that are greasy or have a lot of fat or sugar. General instructions Take over-the-counter and prescription medicines only as told by your doctor. Do  not take sodium tablets. Doing that can make the salt level in your body get too high. Return to your normal activities as told by your doctor. Ask your doctor what activities are safe for you. Keep all follow-up visits. Your doctor may check and change your treatment. Contact a doctor if: You have pain in your belly (abdomen) and the pain: Gets worse. Stays in one place. You have a rash. You have a stiff neck. You get angry or annoyed more easily than normal. You are more tired or have a harder time waking than normal. You feel weak or dizzy. You feel very thirsty. Get help right away if: You have any symptoms of very bad dehydration. You vomit every time you eat or drink. Your vomiting gets worse, does not go away, or you vomit blood or green stuff. You are getting treatment, but symptoms are getting worse. You have a fever. You have a very bad headache. You have: Diarrhea that gets worse or does not go away. Blood in your poop (stool). This may cause poop to look black and tarry. No pee in 6-8 hours. Only a small amount of pee in 6-8 hours, and the pee is very dark. You have trouble breathing. These symptoms may be an emergency. Get help right away. Call 911. Do not wait to see if the symptoms will go away. Do not drive yourself to the hospital. This information is not intended to replace advice given to you by your health care provider. Make sure you discuss any questions you have with your health care provider. Document Revised: 08/31/2021 Document Reviewed: 08/31/2021 Elsevier Patient Education  2024 [employer].

## 2024-01-10 ENCOUNTER — Encounter: Payer: Self-pay | Admitting: Oncology

## 2024-01-11 ENCOUNTER — Ambulatory Visit (INDEPENDENT_AMBULATORY_CARE_PROVIDER_SITE_OTHER)
Admission: RE | Admit: 2024-01-11 | Discharge: 2024-01-11 | Disposition: A | Discharge status: Home / Self Care | Source: Ambulatory Visit | Attending: Oncology | Admitting: Oncology

## 2024-01-11 DIAGNOSIS — R918 Other nonspecific abnormal finding of lung field: Secondary | ICD-10-CM | POA: Diagnosis not present

## 2024-01-11 DIAGNOSIS — C169 Malignant neoplasm of stomach, unspecified: Secondary | ICD-10-CM | POA: Diagnosis not present

## 2024-01-11 DIAGNOSIS — I700 Atherosclerosis of aorta: Secondary | ICD-10-CM | POA: Diagnosis not present

## 2024-01-11 DIAGNOSIS — C168 Malignant neoplasm of overlapping sites of stomach: Secondary | ICD-10-CM | POA: Diagnosis not present

## 2024-01-11 DIAGNOSIS — K828 Other specified diseases of gallbladder: Secondary | ICD-10-CM | POA: Diagnosis not present

## 2024-01-11 MED ORDER — HEPARIN SOD (PORK) LOCK FLUSH 100 UNIT/ML IV SOLN
500.0000 [IU] | Freq: Once | INTRAVENOUS | Status: AC
  Administered 2024-01-11: 500 [IU] via INTRAVENOUS

## 2024-01-11 MED ORDER — IOHEXOL 300 MG/ML  SOLN
100.0000 mL | Freq: Once | INTRAMUSCULAR | Status: AC | PRN
  Administered 2024-01-11: 80 mL via INTRAVENOUS

## 2024-01-16 ENCOUNTER — Ambulatory Visit (INDEPENDENT_AMBULATORY_CARE_PROVIDER_SITE_OTHER): Admitting: Podiatry

## 2024-01-16 ENCOUNTER — Encounter: Payer: Self-pay | Admitting: Podiatry

## 2024-01-16 DIAGNOSIS — B351 Tinea unguium: Secondary | ICD-10-CM | POA: Diagnosis not present

## 2024-01-16 DIAGNOSIS — D689 Coagulation defect, unspecified: Secondary | ICD-10-CM

## 2024-01-16 DIAGNOSIS — M79674 Pain in right toe(s): Secondary | ICD-10-CM

## 2024-01-16 DIAGNOSIS — M79675 Pain in left toe(s): Secondary | ICD-10-CM | POA: Diagnosis not present

## 2024-01-16 NOTE — Progress Notes (Signed)
 Subjective:  Patient ID: Mackenzie Lewis, female    DOB: March 24, 1935,  MRN: [id number]  Mackenzie Lewis presents to clinic today for:  Chief Complaint  Patient presents with   [facility]    RFC with out callous Not diabetic and take Eliquis    Patient notes nails are thick, discolored, elongated and painful in shoegear when trying to ambulate.  States her daughter has been trimming her toenails occasionally. She has her grandson accompanying her today. Reports neuropathy pain. Takes chronic eliiquis.  PCP is Jefferey Fitch, MD.  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  If that happens, she is to call us  so that we can schedule her for an ultrasound to look for synovitis.     Essential hypertension 09/09/2016   Gastric cancer (HCC) 09/10/2021   Hyperlipidemia 09/01/2016   Hypertension    Hypothyroidism 09/01/2016   Osteoarthritis of both feet 01/20/2016   Osteoarthritis, hand 01/20/2016   Thyroid  disease    Past Surgical History:  Procedure Laterality Date   APPENDECTOMY     LAPAROSCOPIC APPENDECTOMY N/A 04/10/2016   Procedure: APPENDECTOMY LAPAROSCOPIC;  Surgeon: Vicenta Poli, MD;  Location: [facility] OR;  Service: General;  Laterality: N/A;   Allergies  Allergen Reactions   Doxycycline Rash   Sulfa Antibiotics Other (See Comments) and Rash    Other reaction(s): Other (See Comments)  Made me feel weird    Review of  Systems: Negative except as noted in the HPI.  Objective:  Mackenzie Lewis is a pleasant 88 y.o. female in NAD. AAO x 3.  Vascular Examination: Capillary refill time is 3-5 seconds to toes bilateral. Palpable pedal pulses b/l LE. Digital hair present b/l.  Skin temperature gradient WNL b/l. No varicosities b/l. No cyanosis noted b/l.   Dermatological Examination: Pedal skin with normal turgor, texture and tone b/l. No open wounds. No interdigital macerations b/l. Toenails x10 are 3mm thick, discolored, dystrophic with subungual debris. There is pain with compression of the nail plates.  They are elongated x10  Assessment/Plan: 1. Pain due to onychomycosis of toenails of both feet   2. Coagulation defect     #Onychomycosis with pain  -Nails palliatively debrided as below. -Educated on self-care -Anticoagulated on eliquis  -Has been applying OTC fungi-nail to the toenails she can continue with this.  Procedure: Nail Debridement Rationale: Pain Type of Debridement: manual, sharp debridement. Instrumentation: Nail nipper, rotary burr. Number of Nails: 10   Return in about 3 months (around 04/15/2024) for Routine Foot Care.   Ethan LITTIE Saddler, DPM, AACFAS [facility]     [address] [place], [place] [address]  Office [phone]  Fax [phone]

## 2024-01-17 ENCOUNTER — Other Ambulatory Visit: Payer: Self-pay | Admitting: Hematology and Oncology

## 2024-01-17 ENCOUNTER — Inpatient Hospital Stay

## 2024-01-17 ENCOUNTER — Inpatient Hospital Stay: Attending: Hematology and Oncology | Admitting: Oncology

## 2024-01-17 ENCOUNTER — Inpatient Hospital Stay: Attending: Hematology and Oncology

## 2024-01-17 ENCOUNTER — Other Ambulatory Visit: Payer: Self-pay | Admitting: Oncology

## 2024-01-17 ENCOUNTER — Encounter: Payer: Self-pay | Admitting: Oncology

## 2024-01-17 ENCOUNTER — Telehealth: Payer: Self-pay | Admitting: Oncology

## 2024-01-17 VITALS — BP 139/70 | HR 93 | Temp 97.7°F | Resp 18 | Ht 64.0 in | Wt 207.7 lb

## 2024-01-17 DIAGNOSIS — E876 Hypokalemia: Secondary | ICD-10-CM | POA: Insufficient documentation

## 2024-01-17 DIAGNOSIS — Z86718 Personal history of other venous thrombosis and embolism: Secondary | ICD-10-CM | POA: Diagnosis not present

## 2024-01-17 DIAGNOSIS — D649 Anemia, unspecified: Secondary | ICD-10-CM | POA: Insufficient documentation

## 2024-01-17 DIAGNOSIS — E860 Dehydration: Secondary | ICD-10-CM

## 2024-01-17 DIAGNOSIS — I129 Hypertensive chronic kidney disease with stage 1 through stage 4 chronic kidney disease, or unspecified chronic kidney disease: Secondary | ICD-10-CM | POA: Diagnosis not present

## 2024-01-17 DIAGNOSIS — C169 Malignant neoplasm of stomach, unspecified: Secondary | ICD-10-CM | POA: Insufficient documentation

## 2024-01-17 DIAGNOSIS — Z5111 Encounter for antineoplastic chemotherapy: Secondary | ICD-10-CM | POA: Diagnosis present

## 2024-01-17 DIAGNOSIS — Z7901 Long term (current) use of anticoagulants: Secondary | ICD-10-CM | POA: Insufficient documentation

## 2024-01-17 DIAGNOSIS — N142 Nephropathy induced by unspecified drug, medicament or biological substance: Secondary | ICD-10-CM

## 2024-01-17 DIAGNOSIS — Z79899 Other long term (current) drug therapy: Secondary | ICD-10-CM | POA: Insufficient documentation

## 2024-01-17 DIAGNOSIS — N182 Chronic kidney disease, stage 2 (mild): Secondary | ICD-10-CM | POA: Diagnosis not present

## 2024-01-17 DIAGNOSIS — R7989 Other specified abnormal findings of blood chemistry: Secondary | ICD-10-CM

## 2024-01-17 DIAGNOSIS — C168 Malignant neoplasm of overlapping sites of stomach: Secondary | ICD-10-CM

## 2024-01-17 LAB — CMP (CANCER CENTER ONLY)
ALT: 23 U/L (ref 0–44)
AST: 26 U/L (ref 15–41)
Albumin: 3.8 g/dL (ref 3.5–5.0)
Alkaline Phosphatase: 96 U/L (ref 38–126)
Anion gap: 13 (ref 5–15)
BUN: 19 mg/dL (ref 8–23)
CO2: 22 mmol/L (ref 22–32)
Calcium: 9.1 mg/dL (ref 8.9–10.3)
Chloride: 102 mmol/L (ref 98–111)
Creatinine: 1.18 mg/dL — ABNORMAL HIGH (ref 0.44–1.00)
GFR, Estimated: 45 mL/min — ABNORMAL LOW (ref >60)
Glucose, Bld: 168 mg/dL — ABNORMAL HIGH (ref 70–99)
Potassium: 3.9 mmol/L (ref 3.5–5.1)
Sodium: 137 mmol/L (ref 135–145)
Total Bilirubin: 0.3 mg/dL (ref 0.0–1.2)
Total Protein: 6.2 g/dL — ABNORMAL LOW (ref 6.5–8.1)

## 2024-01-17 LAB — CBC WITH DIFFERENTIAL (CANCER CENTER ONLY)
Abs Immature Granulocytes: 0.01 K/uL (ref 0.00–0.07)
Basophils Absolute: 0 K/uL (ref 0.0–0.1)
Basophils Relative: 1 %
Eosinophils Absolute: 0.2 K/uL (ref 0.0–0.5)
Eosinophils Relative: 2 %
HCT: 36.2 % (ref 36.0–46.0)
Hemoglobin: 11.5 g/dL — ABNORMAL LOW (ref 12.0–15.0)
Immature Granulocytes: 0 %
Lymphocytes Relative: 25 %
Lymphs Abs: 1.6 K/uL (ref 0.7–4.0)
MCH: 26 pg (ref 26.0–34.0)
MCHC: 31.8 g/dL (ref 30.0–36.0)
MCV: 81.9 fL (ref 80.0–100.0)
Monocytes Absolute: 0.7 K/uL (ref 0.1–1.0)
Monocytes Relative: 11 %
Neutro Abs: 3.9 K/uL (ref 1.7–7.7)
Neutrophils Relative %: 61 %
Platelet Count: 259 K/uL (ref 150–400)
RBC: 4.42 MIL/uL (ref 3.87–5.11)
RDW: 15.9 % — ABNORMAL HIGH (ref 11.5–15.5)
WBC Count: 6.4 K/uL (ref 4.0–10.5)
nRBC: 0 % (ref 0.0–0.2)

## 2024-01-17 LAB — TSH: TSH: 3.3 u[IU]/mL (ref 0.350–4.500)

## 2024-01-17 MED ORDER — ONDANSETRON HCL 4 MG/2ML IJ SOLN
8.0000 mg | Freq: Once | INTRAMUSCULAR | Status: AC
  Administered 2024-01-17: 8 mg via INTRAVENOUS
  Filled 2024-01-17: qty 4

## 2024-01-17 MED ORDER — TRAMADOL HCL 50 MG PO TABS: 50.0000 mg | ORAL_TABLET | Freq: Four times a day (QID) | ORAL | 0 refills | Status: AC | PRN

## 2024-01-17 MED ORDER — SUCRALFATE 1 G PO TABS: 1.0000 g | ORAL_TABLET | Freq: Three times a day (TID) | ORAL | 5 refills | Status: AC

## 2024-01-17 MED ORDER — LORAZEPAM 1 MG PO TABS: 1.0000 mg | ORAL_TABLET | Freq: Three times a day (TID) | ORAL | 0 refills | Status: DC

## 2024-01-17 MED ORDER — SODIUM CHLORIDE 0.9 % IV SOLN: Freq: Once | INTRAVENOUS | Status: AC

## 2024-01-17 MED ORDER — DEXAMETHASONE SOD PHOSPHATE PF 10 MG/ML IJ SOLN
8.0000 mg | Freq: Once | INTRAMUSCULAR | Status: AC
  Administered 2024-01-17: 8 mg via INTRAVENOUS

## 2024-01-17 NOTE — Patient Instructions (Signed)
 Dexamethasone  Injection What is this medication? DEXAMETHASONE  (dex a METH a sone) treats many conditions such as asthma, allergic reactions, arthritis, inflammatory bowel diseases, adrenal, and blood or bone marrow disorders. It works by decreasing inflammation and slowing down an overactive immune system. It belongs to a group of medications called steroids. This medicine may be used for other purposes; ask your health care provider or pharmacist if you have questions. COMMON BRAND NAME(S): Decadron , DEX24, DoubleDex, ReadySharp Dexamethasone , Simplist Dexamethasone , Solurex What should I tell my care team before I take this medication? They need to know if you have any of these conditions: Cushing syndrome Diabetes Glaucoma Heart attack Heart disease High blood pressure Infection, such as tuberculosis (TB), bacterial, fungal, or viral infections Kidney disease Liver disease Mental health condition Myasthenia gravis Osteoporosis Seizures Stomach or intestine problems Thyroid  disease An unusual or allergic reaction to dexamethasone , lactose, other medications, foods, dyes, or preservatives Pregnant or trying to get pregnant Breastfeeding How should I use this medication? This medication is injected into a muscle, joint, lesion, or other tissue. It is given by your care team in a hospital or clinic setting. Talk to your care team about the use of this medication in children. Special care may be needed. Overdosage: If you think you have taken too much of this medicine contact a poison control center or emergency room at once. NOTE: This medicine is only for you. Do not share this medicine with others. What if I miss a dose? This does not apply. What may interact with this medication? Do not take this medication with any of the following: Live virus vaccines This medication may also interact with the following: Aminoglutethimide Amphotericin B Aspirin and aspirin-like  medications Certain antibiotics, such as erythromycin, clarithromycin, troleandomycin Certain antivirals for HIV or hepatitis Certain medications for seizures, such as carbamazepine, phenobarbital , phenytoin  Certain medications to treat myasthenia gravis Cholestyramine Cyclosporine Digoxin Diuretics Ephedrine Estrogen and progestin hormones Insulin or other medications for diabetes Isoniazid Ketoconazole Medications that relax muscles for surgery Mifepristone NSAIDs, medications for pain and inflammation, such as ibuprofen or naproxen Rifampin Skin tests for allergies Thalidomide Vaccines Warfarin This list may not describe all possible interactions. Give your health care provider a list of all the medicines, herbs, non-prescription drugs, or dietary supplements you use. Also tell them if you smoke, drink alcohol, or use illegal drugs. Some items may interact with your medicine. What should I watch for while using this medication? Visit your care team for regular checks on your progress. Tell your care team if your symptoms do not start to get better or if they get worse. Your condition will be monitored carefully while you are receiving this medication. Wear a medical ID bracelet or chain. Carry a card that describes your condition. List the medications and doses you take on the card. This medication may increase your risk of getting an infection. Call your care team for advice if you get a fever, chills, sore throat, or other symptoms of a cold or flu. Do not treat yourself. Try to avoid being around people who are sick. If you have not had the measles or chickenpox vaccines, tell your care team right away if you are around someone with these viruses. If you are going to need surgery or other procedure, tell your care team that you are using this medication. You may need to be on a special diet while you are taking this medication. Ask your care team. Also, find out how many glasses of  fluids you need to drink each day. This medication may increase blood sugar. The risk may be higher in patients who already have diabetes. Ask your care team what you can do to lower your risk of diabetes while taking this medication. What side effects may I notice from receiving this medication? Side effects that you should report to your care team as soon as possible: Allergic reactions--skin rash, itching, hives, swelling of the face, lips, tongue, or throat Cushing syndrome--increased fat around the midsection, upper back, neck, or face, pink or purple stretch marks on the skin, thinning, fragile skin that easily bruises, unexpected hair growth High blood sugar (hyperglycemia)--increased thirst or amount of urine, unusual weakness or fatigue, blurry vision Increase in blood pressure Infection--fever, chills, cough, sore throat, wounds that don't heal, pain or trouble when passing urine, general feeling of discomfort or being unwell Low adrenal gland function--nausea, vomiting, loss of appetite, unusual weakness or fatigue, dizziness Mood and behavior changes--anxiety, nervousness, confusion, hallucinations, irritability, hostility, thoughts of suicide or self-harm, worsening mood, feelings of depression Stomach bleeding--bloody or black, tar-like stools, vomiting blood or brown material that looks like coffee grounds Swelling of the ankles, hands, or feet Side effects that usually do not require medical attention (report to your care team if they continue or are bothersome): Acne General discomfort and fatigue Headache Increase in appetite Nausea Trouble sleeping Weight gain This list may not describe all possible side effects. Call your doctor for medical advice about side effects. You may report side effects to FDA at 1-800-FDA-1088. Where should I keep my medication? This medication is given in a hospital or clinic. It will not be stored at home. NOTE: This sheet is a summary. It may  not cover all possible information. If you have questions about this medicine, talk to your doctor, pharmacist, or health care provider.  2024 Elsevier/Gold Standard (2021-07-09 00:00:00)Ondansetron  Injection What is this medication? ONDANSETRON  (on DAN se tron) prevents nausea and vomiting from chemotherapy, radiation, or surgery. It works by blocking substances in the body that may cause nausea or vomiting. It belongs to a class of medications called antiemetics. This medicine may be used for other purposes; ask your health care provider or pharmacist if you have questions. COMMON BRAND NAME(S): Zofran , Zofran  in Dextrose , Zofran  Solution What should I tell my care team before I take this medication? They need to know if you have any of these conditions: Heart disease History of irregular heartbeat Liver disease Low levels of magnesium or potassium in the blood An unusual or allergic reaction to ondansetron , granisetron, other medications, foods, dyes, or preservatives Pregnant or trying to get pregnant Breast-feeding How should I use this medication? This medication is injected into a vein. It is given by your care team in a hospital or clinic setting. Talk to your care team about the use of this medication in children. Special care may be needed. Overdosage: If you think you have taken too much of this medicine contact a poison control center or emergency room at once. NOTE: This medicine is only for you. Do not share this medicine with others. What if I miss a dose? This does not apply. What may interact with this medication? Do not take this medication with any of the following: Apomorphine Certain medications for fungal infections, such as fluconazole , itraconazole, ketoconazole, posaconazole, voriconazole Cisapride Dronedarone Pimozide Thioridazine This medication may also interact with the following: Carbamazepine Certain medications for depression, anxiety, or mental health  conditions Fentanyl  Linezolid MAOIs, such as  Carbex, Eldepryl, Marplan, Nardil, and Parnate Methylene blue (injected into a vein) Other medications that cause heart rhythm changes, such as dofetilide, ziprasidone Phenytoin  Rifampicin Tramadol This list may not describe all possible interactions. Give your health care provider a list of all the medicines, herbs, non-prescription drugs, or dietary supplements you use. Also tell them if you smoke, drink alcohol, or use illegal drugs. Some items may interact with your medicine. What should I watch for while using this medication? Your condition will be monitored carefully while you are receiving this medication. What side effects may I notice from receiving this medication? Side effects that you should report to your care team as soon as possible: Allergic reactions--skin rash, itching, hives, swelling of the face, lips, tongue, or throat Bowel blockage--stomach cramping, unable to have a bowel movement or pass gas, loss of appetite, vomiting Chest pain (angina)--pain, pressure, or tightness in the chest, neck, back, or arms Heart rhythm changes--fast or irregular heartbeat, dizziness, feeling faint or lightheaded, chest pain, trouble breathing Irritability, confusion, fast or irregular heartbeat, muscle stiffness, twitching muscles, sweating, high fever, seizure, chills, vomiting, diarrhea, which may be signs of serotonin syndrome Side effects that usually do not require medical attention (report to your care team if they continue or are bothersome): Constipation Diarrhea General discomfort and fatigue Headache This list may not describe all possible side effects. Call your doctor for medical advice about side effects. You may report side effects to FDA at 1-800-FDA-1088. Where should I keep my medication? This medication is given in a hospital or clinic and will not be stored at home. NOTE: This sheet is a summary. It may not cover all  possible information. If you have questions about this medicine, talk to your doctor, pharmacist, or health care provider.  2024 Elsevier/Gold Standard (2021-07-02 00:00:00)

## 2024-01-17 NOTE — Progress Notes (Signed)
 [facility]  [address] 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  Stain for HER2 was negative.  PET scan revealed omental involvement. She received palliative chemotherapy with FOLFOX/nivolumab  (5-fluorouracil /leucovorin /oxaliplatin /nivolumab ). Oxaliplatin  was discontinued after 11 cycles, and she continues 5-fluorouracil /leucovorin /nivolumab .  PET in February 2024 revealed resolution of metabolic activity associated with the stomach.  Decrease in size of omental nodularity with no associated metabolic activity.   EGD in March revealed residual disease. CT imaging every few months has remained stable. She has had chronic kidney disease since developing immune mediated nephritis in February 2024, which resolved with high-dose steroids and holding nivolumab .  She receives extra IV fluids weekly to prevent recurrent acute kidney injury.   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   Her disease has remained stable with maintenance fluorouracil /leucovorin /nivolumab . CT chest, abdomen and pelvis in May of 2025 revealed persistent nonspecific diffuse gastric wall thickening with persistent subtle peritoneal nodularity inferior to the gastric body and antrum. Stable small solid pulmonary nodules in the left lung base measuring up to 6 mm, favored benign etiology. Following that, we changed her schedule from every 2 weeks to every 3 weeks.She was found to have Campylobacter in the stool after her 39th cycle of 5-fluorouracil /leucovorin /nivolumab  cycle. CT chest, abdomen, and pelvis done on 09/29/2023 which revealed  redemonstration of irregular moderate thickening of the distal stomach body/pylorus, which is  grossly similar to the prior study, there is associated mild perigastric fat stranding and multiple subcentimeter sized lymph nodes that are also grossly similar to the prior study from 06/30/2023 but progressed since the PET scan from 03/22/2022. There is no new metastatic disease identified within the chest, abdomen or pelvis. She had leucovorin  and fluorouracil  reduced by an additional 20% due to persistent nausea/vomiting and diarrhea at the end of September.   CT scan and EGD of November 2025 confirm progression of disease and we have stopped her current chemotherapy regimen. We discussed options as outlined below and most likely will go with comfort care. We did discuss hospice care and what services they offer. We discussed code status and she wishes DNR in the event of a cardiorespiratory arrest. She will take time to think it over.    Drug-induced interstitial nephritis History immune mediated nephritis from immunotherapy in February, which resolved with prednisone .  She has had chronic kidney disease since that episode.  She will continue to get weekly IV fluids for now.  She knows to push oral fluids.     Acute deep vein thrombosis (DVT) of popliteal vein of right lower extremity (HCC) Occlusive deep venous thrombosis along the popliteal and peroneal vein diagnosed in March 2025. She was placed on apixaban  10 mg twice daily for 7 days, then apixaban  5 mg twice daily indefinitely.  Aspirin 81 mg daily was discontinued.  The patient states she continues apixaban  5 mg twice daily without difficulty.  Cardiac Arrhthymias EKG shows occasional PVC's but she was in trigeminy when I listened during her exam. She is essentially asymptomatic from this but we will run it by Dr. Bernie. She continues to have PVC's fairly often but is being monitored by Dr. Krasowski.  Plan: She is currently not taking anything for pain relief, so I will prescribe her  with tramadol  50 mg Q6 prn and increase her Ativan   to 1 mg, she is mainly using this at night to help her sleep but I explained this can also help her nausea, and she can use 1/2 pill if too sedating. She had a esophagogastroduodenoscopy done by Dr. Larene on 01/06/2024 that revealed an large ulcer in the greater curvature of the stomach consistent with her prior gastric cancer and he performed an empiric esophageal dilation to 50 French. A biopsy was done which confirmed poorly differentiated adenocarcinoma with signet ring cell features in a background of ulceration. Dr. Larene recommended she continue omeprazole  BID, antacids prn, and a soft diet. She is using famotidine and mylanta and I will try adding Carafate . I informed her about Carafate , to dissolve tablet into a slurry, and explained the benefits and will send in the prescription. I reiterated for her to not take this with other medications. CT chest, abdomen, and pelvis done on 01/11/2024 which revealed progressive wall thickening of the gastric antrum/pylorus with nodular areas of hyperenhancement along the superior margin with progressive adjacent fat stranding and increased size of some soft tissue nodule/lymph nodes, as well as progressive nodular stranding along the celiac axis, along the gastrohepatic ligament, and in the anterior omentum, and new focus of nodular thickening along the left pericolic gutter. Progressive ill-defined nodular thickening along the lateral limb of the right adrenal gland measuring 16 mm is also suspicious for metastatic disease involvement. Stable scattered left lower lobe pulmonary nodules were also noted, similar symmetric distal esophageal wall thickening, and no new suspicious pulmonary nodules or masses.   We know she had a mutation of Claudin 18 so we discussed the option of starting a new drug which targets this, however our concern is her tolerating common side effects of nausea, vomiting, and diarrhea. Another option would be to add back Oxaliplatin ,  however, the Oxaliplatin  has the side effect of neuropathy which she already has, fairly severe, of her bilateral feet. A third option would be Taxotere chemotherapy with or without cyramza, but explained the high incidence of diarrhea and neuropathy with some nausea. We then discussed the other option is palliative care. The patient inquired about comfort care and I reassured her that this is always an option. I explained the process of comfort care and Hospice to her and her family. We discussed code status and in the event of an cardiac arrest she declined being resuscitated. I have signed an DNR for her today and she states she will bring in a copy of her Living will for me. We also discussed feeding tube and she prefers not to pursue that. I agree and it would be very difficult to place one. She has a WBC of 6.4, low stable hemoglobin of 11.5, and platelet count of 259,000. Her CMP is fairly normal other than an elevated glucose of 168, creatinine of 1.18, and low total protein of 6.2 down from 6.4. Her TSH and T4 today are pending.  We reviewed her medication list and removed hydrochlorothiazide, vitamin D, krill oil, calcium , B-complex, pravastatin , and decrease her potassium to once daily. She will receive 1L of normal saline, dexamethasone  8 mg IV, and Zofran  8 mg IV. [name] back in 1 week with CBC, CMP, and probable IV fluids. The patient understands the plans discussed today and is in agreement.  She knows to contact our office if she develops concerns prior to her next appointment.  I provided 60 minutes of face-to-face time  during this encounter and > 50% was spent counseling as documented under my assessment and plan.   [name], MD  [facility] OF [facility] [address] [place] [address] Dept: [phone] Dept Fax: [phone]   No orders of the defined types were placed in this encounter.   CHIEF  COMPLAINT:  CC: Stage IVB gastric cancer  Current Treatment: Palliative care  HISTORY OF PRESENT ILLNESS:   Oncology History  Gastric cancer (HCC)  09/10/2021 Initial Diagnosis   Gastric cancer (HCC)   09/10/2021 Cancer Staging   Staging form: Stomach, AJCC 8th Edition - Clinical stage from 09/10/2021: Stage IVB (cT4b, cN0, cM1) - Signed by Cornish Wanda VEAR, MD on 09/10/2021 Histopathologic type: Adenocarcinoma, NOS Stage prefix: Initial diagnosis Total positive nodes: 0 Histologic grade (G): G3 Histologic grading system: 3 grade system Sites of metastasis: Peritoneal surface Diagnostic confirmation: Positive histology PLUS positive immunophenotyping and/or positive genetic studies Specimen type: Endoscopy with Biopsy Staged by: Managing physician Carcinoembryonic antigen (CEA) (ng/mL): 2.8 Carbohydrate antigen 19-9 (CA 19-9) (U/mL): 4.9 HER2 status: Unknown Microsatellite instability (MSI): Unknown Tumor location in stomach: Other Clinical staging modalities: Biopsy, Endoscopy Stage used in treatment planning: Yes National guidelines used in treatment planning: Yes Type of national guideline used in treatment planning: NCCN   09/28/2021 - 10/14/2021 Chemotherapy   Patient is on Treatment Plan : GASTROESOPHAGEAL FOLFOX + Nivolumab  q14d     09/28/2021 - 12/29/2023 Chemotherapy   Patient is on Treatment Plan : GASTROESOPHAGEAL FOLFOX + Nivolumab  q21d (changed from q14d on 04/12/23)     12/09/2021 Genetic Testing   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  Report date is 12/09/2021.   The Multi-Cancer + RNA Panel offered by [facility] includes sequencing and/or deletion/duplication analysis of the following 84 genes:  AIP*, ALK, APC*, ATM*, AXIN2*, BAP1*, BARD1*, BLM*, BMPR1A*, BRCA1*, BRCA2*, BRIP1*, CASR, CDC73*, CDH1*, CDK4, CDKN1B*, CDKN1C*, CDKN2A, CEBPA, CHEK2*, CTNNA1*, DICER1*, DIS3L2*, EGFR, EPCAM, FH*, FLCN*, GATA2*, GPC3, GREM1,  HOXB13, HRAS, KIT, MAX*, MEN1*, MET, MITF, MLH1*, MSH2*, MSH3*, MSH6*, MUTYH*, NBN*, NF1*, NF2*, NTHL1*, PALB2*, PDGFRA, PHOX2B, PMS2*, POLD1*, POLE*, POT1*, PRKAR1A*, PTCH1*, PTEN*, RAD50*, RAD51C*, RAD51D*, RB1*, RECQL4, RET, RUNX1*, SDHA*, SDHAF2*, SDHB*, SDHC*, SDHD*, SMAD4*, SMARCA4*, SMARCB1*, SMARCE1*, STK11*, SUFU*, TERC, TERT, TMEM127*, Tp53*, TSC1*, TSC2*, VHL*, WRN*, and WT1.  RNA analysis is performed for * genes.     INTERVAL HISTORY:  Mackenzie Lewis is here today for repeat clinical assessment for his stage IVB gastric cancer. Patient states that she feels ok but complains of abdominal and back discomfort, 2-3 episodes of headaches with visual changes, some nausea, and 1 episode of vomiting. She is currently not taking anything for pain relief, so I will prescribe tramadol  50 mg Q6 prn and increase her Ativan  to 1 mg, she is mainly using this at night to help her sleep but I explained this can also help her nausea, and she can use 1/2 tablet if it is too sedating. She had a esophagogastroduodenoscopy done by Dr. Larene on 01/06/2024 that revealed an large ulcer in the greater curvature of the stomach consistent with her prior gastric cancer and he performed an empiric esophageal dilation to 50 French. A biopsy was done which confirmed poorly differentiated adenocarcinoma with signet ring cell features in a background of ulceration. Dr. Larene recommended she continue omeprazole  BID, antacids prn, and an soft diet. She is using famotidine and mylanta and I will  try adding carafate . I informed her about Carafate  and explained the benefits and will send in the prescription. I reiterated for her to not take this with other medications. CT chest, abdomen, and pelvis done on 01/11/2024 which revealed progressive wall thickening of the gastric antrum/pylorus with nodular areas of hyperenhancement along the superior margin with progressive adjacent fat stranding and increased size of some soft tissue  nodule/lymph nodes, as well as progressive nodular stranding along the celiac axis, along the gastrohepatic ligament, and in the anterior omentum, and new focus of nodular thickening along the left pericolic gutter. Progressive ill-defined nodular thickening along the lateral limb of the right adrenal gland measuring 16 mm is also suspicious for metastatic disease involvement. Stable scattered left lower lobe pulmonary nodules were also noted, similar symmetric distal esophageal wall thickening, and no new suspicious pulmonary nodules or masses.   We know she had a mutation of Claudin 18 so we discussed the option of starting a new drug which targets this, however our concern is her tolerating common side effects of nausea, vomiting, and diarrhea. Another option would be to add back Oxaliplatin , however, the Oxaliplatin  has the side effect of neuropathy which she already has, fairly severe, of her bilateral feet. A third option would be Taxotere chemotherapy with or without cyramza, but explained the high incidence of diarrhea and neuropathy with some nausea. We then discussed the other option is palliative care. The patient inquired about comfort care and I reassured her that this is always an option. I explained the process of comfort care and Hospice to her and her family. We discussed code status and in the event of an cardiac arrest she declined being resuscitated. I have signed an DNR for her today and she states she will bring in a copy of her living will for me. We also discussed feeding tube and she prefers not to pursue that. I agree and it would be very difficult to place one. She has a WBC of 6.4, low stable hemoglobin of 11.5, and platelet count of 259,000. Her CMP is fairly normal other than an elevated glucose of 168, creatinine of 1.18, and low total protein of 6.2 down from 6.4. Her TSH and T4 today are pending.  We reviewed her medication list and removed hydrochlorothiazide, vitamin D, krill  oil, calcium , B-complex, pravastatin , and decreased her potassium to once daily. She will receive 1L of normal saline, dexamethasone  8 mg IV, and Zofran  8 mg IV. [name] back in 1 week with CBC, CMP, and probable IV fluids. She denies fever, chills, night sweats, or other signs of infection. She denies cardiorespiratory issues. Her appetite is poor and Her weight has decreased 3 pounds over last 3 weeks. This patient is accompanied in the office by her daughter, son, and daughter in-law.   REVIEW OF SYSTEMS:  Review of Systems  Constitutional:  Positive for appetite change (early satiety, poor). Negative for chills, diaphoresis, fatigue, fever and unexpected weight change.  HENT:  Negative.  Negative for hearing loss, lump/mass, mouth sores, nosebleeds, sore throat, tinnitus, trouble swallowing and voice change.   Eyes: Negative.   Respiratory: Negative.  Negative for chest tightness, cough, hemoptysis, shortness of breath and wheezing.   Cardiovascular: Negative.  Negative for chest pain, leg swelling and palpitations.  Gastrointestinal:  Positive for abdominal pain (gnawing pain/discomfort), diarrhea (improved), nausea (intermittent, mild) and vomiting (1 episode). Negative for abdominal distention, blood in stool, constipation and rectal pain.  Endocrine: Negative.  Negative for  hot flashes.  Genitourinary:  Negative for bladder incontinence, difficulty urinating, dyspareunia, dysuria, frequency, hematuria, menstrual problem, nocturia, pelvic pain, vaginal bleeding and vaginal discharge.   Musculoskeletal:  Positive for arthralgias, back pain (aching pain) and gait problem (due to left foot and ankle pain). Negative for flank pain, myalgias, neck pain and neck stiffness.       Shoulder pain  Skin: Negative.  Negative for itching and rash.  Neurological:  Positive for dizziness, extremity weakness (generalized weakness), gait problem (due to left foot and ankle pain), headaches (with visual  changes), light-headedness and numbness (neuropathy feet). Negative for seizures and speech difficulty.       Restless leg syndrome   Hematological: Negative.  Negative for adenopathy. Does not bruise/bleed easily.  Psychiatric/Behavioral:  Positive for sleep disturbance. Negative for confusion, decreased concentration, depression and suicidal ideas. The patient is not nervous/anxious.   All other systems reviewed and are negative.   VITALS:  Blood pressure 139/70, pulse 93, temperature 97.7 F (36.5 C), temperature source Oral, resp. rate 18, height 5' 4 (1.626 m), weight 207 lb 11.2 oz (94.2 kg), SpO2 97%.  Wt Readings from Last 3 Encounters:  01/17/24 207 lb 11.2 oz (94.2 kg)  12/29/23 211 lb (95.7 kg)  12/27/23 210 lb 1.6 oz (95.3 kg)    Body mass index is 35.65 kg/m.  Performance status (ECOG): 1 - Symptomatic but completely ambulatory  PHYSICAL EXAM:  Physical Exam Vitals and nursing note reviewed. Exam conducted with a chaperone present.  Constitutional:      General: She is not in acute distress.    Appearance: Normal appearance. She is normal weight. She is not diaphoretic.  HENT:     Head: Normocephalic and atraumatic.     Right Ear: Tympanic membrane, ear canal and external ear normal. There is no impacted cerumen.     Left Ear: Tympanic membrane, ear canal and external ear normal. There is no impacted cerumen.     Nose: Nose normal. No congestion or rhinorrhea.     Mouth/Throat:     Mouth: Mucous membranes are moist.     Pharynx: Oropharynx is clear. No oropharyngeal exudate or posterior oropharyngeal erythema.  Eyes:     General: No scleral icterus.    Extraocular Movements: Extraocular movements intact.     Conjunctiva/sclera: Conjunctivae normal.     Pupils: Pupils are equal, round, and reactive to light.  Cardiovascular:     Rate and Rhythm: Normal rate and regular rhythm.     Pulses: Normal pulses.     Heart sounds: Normal heart sounds. No murmur heard.     No friction rub. No gallop.  Pulmonary:     Effort: Pulmonary effort is normal.     Breath sounds: Normal breath sounds. No wheezing, rhonchi or rales.  Abdominal:     General: Bowel sounds are normal. There is no distension.     Palpations: Abdomen is soft. There is no hepatomegaly, splenomegaly or mass.     Tenderness: There is abdominal tenderness in the epigastric area.     Comments: Firmness in the upper mid abdomen, considerably increased.   Musculoskeletal:        General: Normal range of motion.     Cervical back: Normal range of motion and neck supple. No tenderness.     Right lower leg: Edema (mild) present.     Left lower leg: Edema (mild) present.     Left ankle: No swelling. No tenderness.  Lymphadenopathy:  Cervical: No cervical adenopathy.     Upper Body:     Right upper body: No supraclavicular or axillary adenopathy.     Left upper body: No supraclavicular or axillary adenopathy.     Lower Body: No right inguinal adenopathy. No left inguinal adenopathy.  Skin:    General: Skin is warm and dry.     Coloration: Skin is not jaundiced.     Findings: No rash.  Neurological:     General: No focal deficit present.     Mental Status: She is alert and oriented to person, place, and time. Mental status is at baseline.     Cranial Nerves: No cranial nerve deficit.  Psychiatric:        Mood and Affect: Mood normal.        Behavior: Behavior normal.        Thought Content: Thought content normal.        Judgment: Judgment normal.    LABS:      Latest Ref Rng & Units 01/17/2024    8:28 AM 12/27/2023    9:32 AM 12/06/2023    9:08 AM  CBC  WBC 4.0 - 10.5 K/uL 6.4  6.1  5.5   Hemoglobin 12.0 - 15.0 g/dL 88.4  88.4  88.1   Hematocrit 36.0 - 46.0 % 36.2  34.9  35.5   Platelets 150 - 400 K/uL 259  228  226       Latest Ref Rng & Units 01/17/2024    8:28 AM 12/27/2023    9:32 AM 12/06/2023    9:08 AM  CMP  Glucose 70 - 99 mg/dL 831  889  802   BUN 8 - 23 mg/dL 19   22  22    Creatinine 0.44 - 1.00 mg/dL 8.81  8.76  8.67   Sodium 135 - 145 mmol/L 137  139  138   Potassium 3.5 - 5.1 mmol/L 3.9  3.9  4.0   Chloride 98 - 111 mmol/L 102  104  103   CO2 22 - 32 mmol/L 22  25  21    Calcium  8.9 - 10.3 mg/dL 9.1  8.9  9.3   Total Protein 6.5 - 8.1 g/dL 6.2  6.4  6.5   Total Bilirubin 0.0 - 1.2 mg/dL 0.3  0.5  0.6   Alkaline Phos 38 - 126 U/L 96  97  97   AST 15 - 41 U/L 26  20  23    ALT 0 - 44 U/L 23  20  22     Lab Results  Component Value Date   TSH 3.300 01/17/2024   T4TOTAL 8.9 01/17/2024   Lab Results  Component Value Date   CEA1 2.9 09/10/2021   /  CEA  Date Value Ref Range Status  09/10/2021 2.9 0.0 - 4.7 ng/mL Final    Comment:    (NOTE)                             Nonsmokers          <3.9                             Smokers             <5.6 [employer] Electrochemiluminescence Immunoassay (ECLIA) Values obtained with different assay methods or kits cannot be used interchangeably.  Results cannot be interpreted as absolute evidence of  the presence or absence of malignant disease. Performed At: [facility] [address] [place], [place] [id number] Jennette Shorter MD Ey:[phone]    STUDIES:  EXAM: 01/11/2024 CT CHEST, ABDOMEN, AND PELVIS WITH CONTRAST IMPRESSION: 1. Progressive wall thickening of the gastric antrum/pylorus with nodular areas of hyperenhancement along the superior margin, with progressive adjacent fat stranding and increased size of some soft tissue nodule/lymph nodes, as well as progressive nodular stranding along the celiac axis, along the gastrohepatic ligament, and in the anterior omentum, and new focus of nodular thickening along the left pericolic gutter. Findings are compatible with recurrent/worsening gastric cancer with peritoneal carcinomatosis. 2. Progressive ill-defined nodular thickening along the lateral limb of the right adrenal gland measuring 16 mm on image 61/301 is suspicious  for metastatic disease involvement. 3. Stable scattered left lower lobe pulmonary nodules common nonspecific suggest continued attention on follow-up imaging. No new suspicious pulmonary nodules or masses. 4. Similar symmetric distal esophageal wall thickening, which may reflect esophagitis.  Exam: 01/06/2024 Esophagogastroduodenoscopy with biopsy and esophageal dilation Impression: Large ulcer with greater curvature in the stomach consistent with her prior gastric cancer.  Empiric esophageal dilation to 50 French.   Exam: 01/06/2024 Pathology: Diagnosis: Poorly differentiated adenocarcinoma with signet ring cell features in a background of ulceration.  EXAM: 09/29/2023 CT CHEST, ABDOMEN, AND PELVIS WITH CONTRAST IMPRESSION: 1. Redemonstration of irregular moderate thickening of the distal stomach body/pylorus, grossly similar to the prior study. There is associated mild perigastric fat stranding and multiple subcentimeter sized lymph nodes, grossly similar to the prior study from 06/30/2023 but progressed since the PET scan from 03/22/2022. These are concerning for locoregional metastatic disease. 2. No new metastatic disease identified within the chest, abdomen or pelvis. 3. Multiple other nonacute observations, as described above.   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  If that happens, she is to call  us  so that we can schedule her for an ultrasound to look for synovitis.     Essential hypertension 09/09/2016   Gastric cancer (HCC) 09/10/2021   Hyperlipidemia 09/01/2016   Hypertension    Hypothyroidism 09/01/2016   Osteoarthritis of both feet 01/20/2016   Osteoarthritis, hand 01/20/2016   Thyroid  disease     Past Surgical History:  Procedure Laterality Date   APPENDECTOMY     LAPAROSCOPIC APPENDECTOMY N/A 04/10/2016   Procedure: APPENDECTOMY LAPAROSCOPIC;  Surgeon: Vicenta Poli, MD;  Location: [facility] OR;  Service: General;  Laterality: N/A;    Family History  Problem Relation Age of Onset   Hypertension Mother    Prostate cancer Father        metastatic; d. 20   Brain cancer Sister 86   Breast cancer Sister 54   AAA (abdominal aortic aneurysm) Brother    Leukemia Cousin        x2 maternal female cousins; d. before 31   Breast cancer Daughter 13       DCIS    Social History:  reports that she has never smoked. She has never used smokeless tobacco. She reports that she does not currently use alcohol . She reports that she does not use drugs.   Allergies:  Allergies  Allergen Reactions   Doxycycline Rash   Sulfa Antibiotics Other (See Comments) and Rash    Other reaction(s): Other (See Comments)  Made me feel weird    Current Medications: Current Outpatient Medications  Medication Sig Dispense Refill   potassium chloride  SA (KLOR-CON  M) 20 MEQ tablet TAKE 1 TABLET TWICE A DAY (Patient taking differently: Take 20 mEq by mouth daily.) 180 tablet 3   b complex vitamins capsule Take 1 capsule by mouth daily.     Calcium  Carbonate (CALCIUM  600 PO) Take 1 tablet by mouth daily.     Cholecalciferol (VITAMIN D3) 5000 units CAPS Take 1 capsule by mouth daily.     ELIQUIS  5 MG TABS tablet TAKE 1 TABLET TWICE A DAY 180 tablet 3   famotidine (PEPCID) 40 MG tablet Take 40 mg by mouth at bedtime.     hydrochlorothiazide (HYDRODIURIL) 25 MG tablet Take 25 mg by mouth daily.      KRILL OIL PO Take 1 capsule by mouth daily. Unknown strenght     Lactobacillus TABS Take 1 tablet by mouth 2 (two) times daily.     levothyroxine  (SYNTHROID , LEVOTHROID) 75 MCG tablet Take 75 mcg by mouth daily before breakfast.      loratadine (CLARITIN) 10 MG tablet Take 10 mg by mouth daily.     LORazepam  (ATIVAN ) 1 MG tablet Take 1 tablet (1 mg total) by mouth every 8 (eight) hours. 60 tablet 0   metoprolol  succinate (TOPROL  XL) 25 MG 24 hr tablet Take 1 tablet (25 mg total) by mouth daily. 90 tablet 3   NON FORMULARY Take 1 Dose by mouth See admin instructions. MMW: 3 parts Maalox 2 parts Benadryl  1 part viscious lidicaine  Disp. 6oz  Instructions: 5ml swish and swallow every 3-4 hours     omeprazole  (PRILOSEC) 40 MG capsule Take 1 capsule (40 mg total) by mouth 2 (two) times daily. 60 capsule 5   ondansetron  (ZOFRAN -ODT) 4 MG disintegrating tablet Take 1 tablet (4 mg total) by mouth every 8 (eight) hours as needed for nausea or vomiting. 90 tablet 3   polyethylene glycol powder (GLYCOLAX /MIRALAX ) 17 GM/SCOOP powder Take 1 Container by mouth daily.     pravastatin  (PRAVACHOL ) 20 MG tablet TAKE 1 TABLET BY MOUTH EVERY DAY IN THE EVENING 90 tablet 2   Probiotic Product (PROBIOTIC DAILY PO) Take 1 tablet by mouth daily.     prochlorperazine  (COMPAZINE ) 10 MG tablet Take 1 tablet (10 mg total) by mouth every 6 (six) hours as needed for nausea or vomiting. 90 tablet 3   sucralfate  (CARAFATE ) 1 g tablet Take 1 tablet (1 g total) by mouth 4 (four) times daily -  with meals and at bedtime. 100 tablet 5   traMADol  (ULTRAM ) 50 MG tablet Take 1 tablet (50 mg total) by mouth every 6 (six) hours as needed. 30 tablet 0   valsartan (DIOVAN) 160 MG tablet Take 160 mg by mouth daily.     No current facility-administered medications for this visit.    I,Jasmine M Lassiter,acting as a scribe for [name], MD.,have documented all relevant documentation on the behalf of [name],  MD,as directed by  [name], MD while in the presence of [name], MD.

## 2024-01-17 NOTE — Telephone Encounter (Signed)
 Patient has been scheduled for follow-up visit per 01/17/2024 LOS.  Pt given an appt calendar with date and time.

## 2024-01-18 ENCOUNTER — Other Ambulatory Visit: Payer: Self-pay | Admitting: Hematology and Oncology

## 2024-01-18 LAB — T4: T4, Total: 8.9 ug/dL (ref 4.5–12.0)

## 2024-01-18 MED ORDER — LORAZEPAM 1 MG PO TABS: 1.0000 mg | ORAL_TABLET | Freq: Three times a day (TID) | ORAL | 0 refills | Status: DC

## 2024-01-19 ENCOUNTER — Inpatient Hospital Stay

## 2024-01-21 ENCOUNTER — Other Ambulatory Visit: Payer: Self-pay | Admitting: Oncology

## 2024-01-21 ENCOUNTER — Encounter: Payer: Self-pay | Admitting: Oncology

## 2024-01-21 DIAGNOSIS — C168 Malignant neoplasm of overlapping sites of stomach: Secondary | ICD-10-CM

## 2024-01-23 ENCOUNTER — Ambulatory Visit: Payer: Self-pay | Admitting: Cardiology

## 2024-01-23 DIAGNOSIS — R002 Palpitations: Secondary | ICD-10-CM

## 2024-01-24 ENCOUNTER — Other Ambulatory Visit: Payer: Self-pay | Admitting: Oncology

## 2024-01-24 ENCOUNTER — Encounter: Payer: Self-pay | Admitting: Oncology

## 2024-01-24 ENCOUNTER — Inpatient Hospital Stay

## 2024-01-24 ENCOUNTER — Inpatient Hospital Stay: Admitting: Oncology

## 2024-01-24 VITALS — BP 139/74 | HR 84 | Temp 97.7°F | Resp 18 | Ht 64.0 in | Wt 208.0 lb

## 2024-01-24 DIAGNOSIS — C168 Malignant neoplasm of overlapping sites of stomach: Secondary | ICD-10-CM

## 2024-01-24 DIAGNOSIS — E860 Dehydration: Secondary | ICD-10-CM

## 2024-01-24 DIAGNOSIS — R7989 Other specified abnormal findings of blood chemistry: Secondary | ICD-10-CM

## 2024-01-24 DIAGNOSIS — Z5111 Encounter for antineoplastic chemotherapy: Secondary | ICD-10-CM | POA: Diagnosis not present

## 2024-01-24 DIAGNOSIS — E876 Hypokalemia: Secondary | ICD-10-CM

## 2024-01-24 LAB — CBC WITH DIFFERENTIAL (CANCER CENTER ONLY)
Abs Immature Granulocytes: 0.05 K/uL (ref 0.00–0.07)
Basophils Absolute: 0 K/uL (ref 0.0–0.1)
Basophils Relative: 0 %
Eosinophils Absolute: 0.2 K/uL (ref 0.0–0.5)
Eosinophils Relative: 3 %
HCT: 34 % — ABNORMAL LOW (ref 36.0–46.0)
Hemoglobin: 11 g/dL — ABNORMAL LOW (ref 12.0–15.0)
Immature Granulocytes: 1 %
Lymphocytes Relative: 24 %
Lymphs Abs: 2.1 K/uL (ref 0.7–4.0)
MCH: 25.9 pg — ABNORMAL LOW (ref 26.0–34.0)
MCHC: 32.4 g/dL (ref 30.0–36.0)
MCV: 80 fL (ref 80.0–100.0)
Monocytes Absolute: 1 K/uL (ref 0.1–1.0)
Monocytes Relative: 11 %
Neutro Abs: 5.3 K/uL (ref 1.7–7.7)
Neutrophils Relative %: 61 %
Platelet Count: 215 K/uL (ref 150–400)
RBC: 4.25 MIL/uL (ref 3.87–5.11)
RDW: 16.2 % — ABNORMAL HIGH (ref 11.5–15.5)
WBC Count: 8.8 K/uL (ref 4.0–10.5)
nRBC: 0 % (ref 0.0–0.2)

## 2024-01-24 LAB — CMP (CANCER CENTER ONLY)
ALT: 18 U/L (ref 0–44)
AST: 19 U/L (ref 15–41)
Albumin: 3.8 g/dL (ref 3.5–5.0)
Alkaline Phosphatase: 96 U/L (ref 38–126)
Anion gap: 10 (ref 5–15)
BUN: 18 mg/dL (ref 8–23)
CO2: 23 mmol/L (ref 22–32)
Calcium: 9 mg/dL (ref 8.9–10.3)
Chloride: 108 mmol/L (ref 98–111)
Creatinine: 1.26 mg/dL — ABNORMAL HIGH (ref 0.44–1.00)
GFR, Estimated: 41 mL/min — ABNORMAL LOW (ref >60)
Glucose, Bld: 115 mg/dL — ABNORMAL HIGH (ref 70–99)
Potassium: 4.2 mmol/L (ref 3.5–5.1)
Sodium: 141 mmol/L (ref 135–145)
Total Bilirubin: 0.4 mg/dL (ref 0.0–1.2)
Total Protein: 6.1 g/dL — ABNORMAL LOW (ref 6.5–8.1)

## 2024-01-24 MED ORDER — SODIUM CHLORIDE 0.9 % IV SOLN: Freq: Once | INTRAVENOUS | Status: AC

## 2024-01-24 MED ORDER — ONDANSETRON HCL 4 MG/2ML IJ SOLN
8.0000 mg | Freq: Once | INTRAMUSCULAR | Status: AC
  Administered 2024-01-24: 8 mg via INTRAVENOUS
  Filled 2024-01-24: qty 4

## 2024-01-24 MED ORDER — DEXAMETHASONE SOD PHOSPHATE PF 10 MG/ML IJ SOLN
8.0000 mg | Freq: Once | INTRAMUSCULAR | Status: AC
  Administered 2024-01-24: 8 mg via INTRAVENOUS

## 2024-01-24 NOTE — Patient Instructions (Signed)

## 2024-01-24 NOTE — Progress Notes (Signed)
 [facility]  [address] 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  Stain for HER2 was negative.  PET scan revealed omental involvement. She received palliative chemotherapy with FOLFOX/nivolumab  (5-fluorouracil /leucovorin /oxaliplatin /nivolumab ). Oxaliplatin  was discontinued after 11 cycles, and she continues 5-fluorouracil /leucovorin /nivolumab .  PET in February 2024 revealed resolution of metabolic activity associated with the stomach.  Decrease in size of omental nodularity with no associated metabolic activity.   EGD in March revealed residual disease. CT imaging every few months has remained stable. She has had chronic kidney disease since developing immune mediated nephritis in February 2024, which resolved with high-dose steroids and holding nivolumab .  She receives extra IV fluids weekly to prevent recurrent acute kidney injury. 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 Her disease remains stable for the last 2 years with palliative chemotherapy, but now has progressed. CT scan and EGD of November 2025 confirm progression of disease and we have stopped her current chemotherapy regimen. We discussed options of further chemotherapy, but she prefers to go with comfort care after discussing the risks and benefits. We did discuss hospice care and what services they offer. We discussed code status and she wishes DNR in the event of a cardiorespiratory arrest. We will provide palliative care.   Drug-induced interstitial nephritis History immune mediated nephritis from immunotherapy in February, which resolved with prednisone .  She has had chronic kidney disease since that episode.  She will continue to  get weekly IV fluids along with antiemetics and steroids. She knows to push oral fluids.     Acute deep vein thrombosis (DVT) of popliteal vein of right lower extremity (HCC) Occlusive deep venous thrombosis along the popliteal and peroneal vein diagnosed in March 2025. She was placed on apixaban  10 mg twice daily for 7 days, then apixaban  5 mg twice daily indefinitely.  Aspirin 81 mg daily was discontinued.  The patient states she continues apixaban  5 mg twice daily without difficulty.  Cardiac Arrhthymias EKG shows occasional PVC's but she was in trigeminy when I listened during her exam. She is essentially asymptomatic from this but we will run it by Dr. Bernie. She continues to have PVC's fairly often but is being monitored by Dr. Krasowski.  Plan: Her stage IVB gastric cancer has progressed on chemotherapy now so it has been stopped. Patient states that she feels fair and had one episode of nausea and vomiting last night. She reports rib pain from the vomiting episode. I prescribed tramadol  for pain as needed, but she has not required it. She is currently taking 10 mg compazine  and Mylanta, so she will hold the Carafate  for now. She has a WBC of 8.8, a low hemoglobin of 11.0 down from 11.5, and platelet count of 215,000. Her CMP is normal other than an elevated creatinine of 1.26 up from 1.18 and a low total protein of 6.1 down from 6.2. She knows to limit potassium in her diet to reduce stomach irritation. She has been eating smaller portions. We discussed the option of palliative care and hospice and she would like to continue palliative care with IV fluids since it is helping her symptoms. I explained that we cannot call in hospice as long as she wishes to continue the IV fluids. I will add steroids  and Zofran  to her IV fluids again today. We will continue this weekly and [name] every 2 weeks. I instructed her to take 25 mg hydrochlorothiazide every other day to control swelling and to  restart vitamin B complex. [name] back in 2 weeks with CBC and CMP.  The patient understands the plans discussed today and is in agreement.  She knows to contact our office if she develops concerns prior to her next appointment.  I provided 27 minutes of face-to-face time during this encounter and > 50% was spent counseling as documented under my assessment and plan.   Mackenzie VEAR Cornish, MD  [facility] DEPT OF [facility] [address] [place] [address] Dept: [phone] Dept Fax: [phone]   No orders of the defined types were placed in this encounter.   CHIEF COMPLAINT:  CC: Stage IVB gastric cancer  Current Treatment: Palliative care  HISTORY OF PRESENT ILLNESS:   Oncology History  Gastric cancer (HCC)  09/10/2021 Initial Diagnosis   Gastric cancer (HCC)   09/10/2021 Cancer Staging   Staging form: Stomach, AJCC 8th Edition - Clinical stage from 09/10/2021: Stage IVB (cT4b, cN0, cM1) - Signed by Lewis Mackenzie VEAR, MD on 09/10/2021 Histopathologic type: Adenocarcinoma, NOS Stage prefix: Initial diagnosis Total positive nodes: 0 Histologic grade (G): G3 Histologic grading system: 3 grade system Sites of metastasis: Peritoneal surface Diagnostic confirmation: Positive histology PLUS positive immunophenotyping and/or positive genetic studies Specimen type: Endoscopy with Biopsy Staged by: Managing physician Carcinoembryonic antigen (CEA) (ng/mL): 2.8 Carbohydrate antigen 19-9 (CA 19-9) (U/mL): 4.9 HER2 status: Unknown Microsatellite instability (MSI): Unknown Tumor location in stomach: Other Clinical staging modalities: Biopsy, Endoscopy Stage used in treatment planning: Yes National guidelines used in treatment planning: Yes Type of national guideline used in treatment planning: NCCN   09/28/2021 - 10/14/2021 Chemotherapy   Patient is on Treatment Plan : GASTROESOPHAGEAL FOLFOX + Nivolumab  q14d      09/28/2021 - 12/29/2023 Chemotherapy   Patient is on Treatment Plan : GASTROESOPHAGEAL FOLFOX + Nivolumab  q21d (changed from q14d on 04/12/23)     12/09/2021 Genetic Testing   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  Report date is 12/09/2021.   The Multi-Cancer + RNA Panel offered by [facility] includes sequencing and/or deletion/duplication analysis of the following 84 genes:  AIP*, ALK, APC*, ATM*, AXIN2*, BAP1*, BARD1*, BLM*, BMPR1A*, BRCA1*, BRCA2*, BRIP1*, CASR, CDC73*, CDH1*, CDK4, CDKN1B*, CDKN1C*, CDKN2A, CEBPA, CHEK2*, CTNNA1*, DICER1*, DIS3L2*, EGFR, EPCAM, FH*, FLCN*, GATA2*, GPC3, GREM1, HOXB13, HRAS, KIT, MAX*, MEN1*, MET, MITF, MLH1*, MSH2*, MSH3*, MSH6*, MUTYH*, NBN*, NF1*, NF2*, NTHL1*, PALB2*, PDGFRA, PHOX2B, PMS2*, POLD1*, POLE*, POT1*, PRKAR1A*, PTCH1*, PTEN*, RAD50*, RAD51C*, RAD51D*, RB1*, RECQL4, RET, RUNX1*, SDHA*, SDHAF2*, SDHB*, SDHC*, SDHD*, SMAD4*, SMARCA4*, SMARCB1*, SMARCE1*, STK11*, SUFU*, TERC, TERT, TMEM127*, Tp53*, TSC1*, TSC2*, VHL*, WRN*, and WT1.  RNA analysis is performed for * genes.     INTERVAL HISTORY:  Mackenzie Lewis is here today for repeat clinical assessment for her stage IVB gastric cancer, which has progressed on chemotherapy now so it has been stopped. Patient states that she feels fair and had one episode of nausea and vomiting last night. She reports rib pain from the vomiting episode. I prescribed tramadol  for pain as needed, but she has not required it. She is currently taking 10 mg compazine  and Mylanta, but she will hold the Carafate  for now. She has a WBC of 8.8, a low  hemoglobin of 11.0 down from 11.5, and platelet count of 215,000. Her CMP is normal other than an elevated creatinine of 1.26 up from 1.18 and a low total protein of 6.1 down from 6.2. She knows to limit potassium in her diet to reduce stomach irritation. She has been eating smaller portions. We discussed the option of palliative care and hospice  and she would like to continue palliative care with IV fluids since it is helping her symptoms.  She does wish DNR status in the event of a cardiorespiratory arrest.  I explained that we cannot call in hospice as long as she wishes to continue the IV fluids. I will add steroids and Zofran  to her IV fluids again today. We will continue this weekly and [name] every 2 weeks. I instructed her to take 25 mg hydrochlorothiazide every other day to control swelling and to restart vitamin B complex. [name] back in 2 weeks with CBC and CMP. She denies fever, chills, night sweats, or other signs of infection. She denies cardiorespiratory and gastrointestinal issues. She  denies pain. Her appetite is poor and Her weight has decreased 1 pound in 1 week. She is accompanied by her daughter.  REVIEW OF SYSTEMS:  Review of Systems  Constitutional:  Positive for appetite change (early satiety, poor). Negative for chills, diaphoresis, fatigue, fever and unexpected weight change.  HENT:  Negative.  Negative for hearing loss, lump/mass, mouth sores, nosebleeds, sore throat, tinnitus, trouble swallowing and voice change.   Eyes: Negative.   Respiratory: Negative.  Negative for chest tightness, cough, hemoptysis, shortness of breath and wheezing.   Cardiovascular: Negative.  Negative for chest pain, leg swelling and palpitations.  Gastrointestinal:  Positive for abdominal pain (gnawing pain/discomfort), diarrhea (improved), nausea (intermittent, mild) and vomiting (1 episode). Negative for abdominal distention, blood in stool, constipation and rectal pain.  Endocrine: Negative.  Negative for hot flashes.  Genitourinary:  Negative for bladder incontinence, difficulty urinating, dyspareunia, dysuria, frequency, hematuria, menstrual problem, nocturia, pelvic pain, vaginal bleeding and vaginal discharge.   Musculoskeletal:  Positive for arthralgias, back pain (aching pain) and gait problem (due to left foot and  ankle pain). Negative for flank pain, myalgias, neck pain and neck stiffness.       Shoulder pain  Skin: Negative.  Negative for itching and rash.  Neurological:  Positive for extremity weakness (generalized weakness), gait problem (due to left foot and ankle pain) and numbness (neuropathy feet). Negative for dizziness, headaches, light-headedness, seizures and speech difficulty.       Restless leg syndrome   Hematological: Negative.  Negative for adenopathy. Does not bruise/bleed easily.  Psychiatric/Behavioral:  Positive for sleep disturbance. Negative for confusion, decreased concentration, depression and suicidal ideas. The patient is not nervous/anxious.   All other systems reviewed and are negative.   VITALS:  Blood pressure 139/74, pulse 84, temperature 97.7 F (36.5 C), temperature source Oral, resp. rate 18, height 5' 4 (1.626 m), weight 208 lb (94.3 kg), SpO2 97%.  Wt Readings from Last 3 Encounters:  01/24/24 208 lb (94.3 kg)  01/17/24 207 lb 11.2 oz (94.2 kg)  12/29/23 211 lb (95.7 kg)    Body mass index is 35.7 kg/m.  Performance status (ECOG): 1 - Symptomatic but completely ambulatory  PHYSICAL EXAM:  Physical Exam Vitals and nursing note reviewed. Exam conducted with a chaperone present.  Constitutional:      General: She is not in acute distress.    Appearance: Normal appearance. She is  normal weight. She is not diaphoretic.  HENT:     Head: Normocephalic and atraumatic.     Right Ear: Tympanic membrane, ear canal and external ear normal. There is no impacted cerumen.     Left Ear: Tympanic membrane, ear canal and external ear normal. There is no impacted cerumen.     Nose: Nose normal. No congestion or rhinorrhea.     Mouth/Throat:     Mouth: Mucous membranes are moist.     Pharynx: Oropharynx is clear. No oropharyngeal exudate or posterior oropharyngeal erythema.  Eyes:     General: No scleral icterus.    Extraocular Movements: Extraocular movements intact.      Conjunctiva/sclera: Conjunctivae normal.     Pupils: Pupils are equal, round, and reactive to light.  Cardiovascular:     Rate and Rhythm: Normal rate and regular rhythm.     Pulses: Normal pulses.     Heart sounds: Normal heart sounds. No murmur heard.    No friction rub. No gallop.  Pulmonary:     Effort: Pulmonary effort is normal.     Breath sounds: Normal breath sounds. No wheezing, rhonchi or rales.  Abdominal:     General: Bowel sounds are normal. There is no distension.     Palpations: Abdomen is soft. There is no hepatomegaly, splenomegaly or mass.     Tenderness: There is abdominal tenderness in the epigastric area.     Comments: Firmness in the upper mid abdomen, considerably increased.   Musculoskeletal:        General: Normal range of motion.     Cervical back: Normal range of motion and neck supple. No tenderness.     Right lower leg: Edema (mild) present.     Left lower leg: Edema (mild) present.     Left ankle: No swelling. No tenderness.  Lymphadenopathy:     Cervical: No cervical adenopathy.     Upper Body:     Right upper body: No supraclavicular or axillary adenopathy.     Left upper body: No supraclavicular or axillary adenopathy.     Lower Body: No right inguinal adenopathy. No left inguinal adenopathy.  Skin:    General: Skin is warm and dry.     Coloration: Skin is not jaundiced.     Findings: No rash.  Neurological:     General: No focal deficit present.     Mental Status: She is alert and oriented to person, place, and time. Mental status is at baseline.     Cranial Nerves: No cranial nerve deficit.  Psychiatric:        Mood and Affect: Mood normal.        Behavior: Behavior normal.        Thought Content: Thought content normal.        Judgment: Judgment normal.    LABS:      Latest Ref Rng & Units 01/31/2024    1:42 PM 01/24/2024   10:44 AM 01/17/2024    8:28 AM  CBC  WBC 4.0 - 10.5 K/uL 11.6  8.8  6.4   Hemoglobin 12.0 - 15.0 g/dL 88.8   88.9  88.4   Hematocrit 36.0 - 46.0 % 35.2  34.0  36.2   Platelets 150 - 400 K/uL 252  215  259       Latest Ref Rng & Units 01/31/2024    1:42 PM 01/24/2024   10:44 AM 01/17/2024    8:28 AM  CMP  Glucose 70 - 99  mg/dL 851  884  831   BUN 8 - 23 mg/dL 19  18  19    Creatinine 0.44 - 1.00 mg/dL 8.74  8.73  8.81   Sodium 135 - 145 mmol/L 138  141  137   Potassium 3.5 - 5.1 mmol/L 4.0  4.2  3.9   Chloride 98 - 111 mmol/L 105  108  102   CO2 22 - 32 mmol/L 23  23  22    Calcium  8.9 - 10.3 mg/dL 9.0  9.0  9.1   Total Protein 6.5 - 8.1 g/dL 6.2  6.1  6.2   Total Bilirubin 0.0 - 1.2 mg/dL 0.3  0.4  0.3   Alkaline Phos 38 - 126 U/L 90  96  96   AST 15 - 41 U/L 23  19  26    ALT 0 - 44 U/L 20  18  23     Lab Results  Component Value Date   TSH 3.300 01/17/2024   T4TOTAL 8.9 01/17/2024   Lab Results  Component Value Date   CEA1 2.9 09/10/2021   /  CEA  Date Value Ref Range Status  09/10/2021 2.9 0.0 - 4.7 ng/mL Final    Comment:    (NOTE)                             Nonsmokers          <3.9                             Smokers             <5.6 [employer] Electrochemiluminescence Immunoassay (ECLIA) Values obtained with different assay methods or kits cannot be used interchangeably.  Results cannot be interpreted as absolute evidence of the presence or absence of malignant disease. Performed At: [facility] [address] [place], [place] [id number] Jennette Shorter MD Ey:[phone]    STUDIES:  EXAM: 01/11/2024 CT CHEST, ABDOMEN, AND PELVIS WITH CONTRAST IMPRESSION: 1. Progressive wall thickening of the gastric antrum/pylorus with nodular areas of hyperenhancement along the superior margin, with progressive adjacent fat stranding and increased size of some soft tissue nodule/lymph nodes, as well as progressive nodular stranding along the celiac axis, along the gastrohepatic ligament, and in the anterior omentum, and new focus of nodular thickening along the  left pericolic gutter. Findings are compatible with recurrent/worsening gastric cancer with peritoneal carcinomatosis. 2. Progressive ill-defined nodular thickening along the lateral limb of the right adrenal gland measuring 16 mm on image 61/301 is suspicious for metastatic disease involvement. 3. Stable scattered left lower lobe pulmonary nodules common nonspecific suggest continued attention on follow-up imaging. No new suspicious pulmonary nodules or masses. 4. Similar symmetric distal esophageal wall thickening, which may reflect esophagitis.  Exam: 01/06/2024 Esophagogastroduodenoscopy with biopsy and esophageal dilation Impression: Large ulcer with greater curvature in the stomach consistent with her prior gastric cancer.  Empiric esophageal dilation to 50 French.   Exam: 01/06/2024 Pathology: Diagnosis: Poorly differentiated adenocarcinoma with signet ring cell features in a background of ulceration.  EXAM: 09/29/2023 CT CHEST, ABDOMEN, AND PELVIS WITH CONTRAST IMPRESSION: 1. Redemonstration of irregular moderate thickening of the distal stomach body/pylorus, grossly similar to the prior study. There is associated mild perigastric fat stranding and multiple subcentimeter sized lymph nodes, grossly similar to the prior study from 06/30/2023 but progressed since the PET scan from 03/22/2022. These are concerning for locoregional  metastatic disease. 2. No new metastatic disease identified within the chest, abdomen or pelvis. 3. Multiple other nonacute observations, as described above.   HISTORY:   Past Medical History:  Diagnosis Date   Abdominal pain 01/17/2023   Appendicitis with peritonitis 04/10/2016   Atypical chest pain 09/09/2016   Benign hypertensive renal disease 09/01/2016   Bilateral primary osteoarthritis of knee 01/20/2016   Borderline diabetes 09/09/2016   CKD (chronic kidney disease), stage II 09/01/2016   Cyclic citrullinated peptide (CCP) antibody  positive 01/20/2016   Because she has positive CCP, I want to make sure we monitor the patient closely and we encouraged the patient to look for symptoms that include increased hand stiffness, swelling and redness to the MCP joint.  If that happens, she is to call us  so that we can schedule her for an ultrasound to look for synovitis.     Essential hypertension 09/09/2016   Gastric cancer (HCC) 09/10/2021   Hyperlipidemia 09/01/2016   Hypertension    Hypothyroidism 09/01/2016   Osteoarthritis of both feet 01/20/2016   Osteoarthritis, hand 01/20/2016   Thyroid  disease     Past Surgical History:  Procedure Laterality Date   APPENDECTOMY     LAPAROSCOPIC APPENDECTOMY N/A 04/10/2016   Procedure: APPENDECTOMY LAPAROSCOPIC;  Surgeon: Vicenta Poli, MD;  Location: [facility] OR;  Service: General;  Laterality: N/A;    Family History  Problem Relation Age of Onset   Hypertension Mother    Prostate cancer Father        metastatic; d. 75   Brain cancer Sister 63   Breast cancer Sister 67   AAA (abdominal aortic aneurysm) Brother    Leukemia Cousin        x2 maternal female cousins; d. before 36   Breast cancer Daughter 19       DCIS    Social History:  reports that she has never smoked. She has never used smokeless tobacco. She reports that she does not currently use alcohol . She reports that she does not use drugs.   Allergies:  Allergies  Allergen Reactions   Doxycycline Rash   Sulfa Antibiotics Other (See Comments) and Rash    Other reaction(s): Other (See Comments)  Made me feel weird    Current Medications: Current Outpatient Medications  Medication Sig Dispense Refill   ELIQUIS  5 MG TABS tablet TAKE 1 TABLET TWICE A DAY 180 tablet 3   famotidine (PEPCID) 40 MG tablet Take 40 mg by mouth at bedtime.     Lactobacillus TABS Take 1 tablet by mouth 2 (two) times daily.     levothyroxine  (SYNTHROID , LEVOTHROID) 75 MCG tablet Take 75 mcg by mouth daily before breakfast.       loratadine (CLARITIN) 10 MG tablet Take 10 mg by mouth daily.     LORazepam  (ATIVAN ) 1 MG tablet Take 1 tablet (1 mg total) by mouth every 8 (eight) hours. 60 tablet 0   metoprolol  succinate (TOPROL  XL) 50 MG 24 hr tablet Take 1 tablet (50 mg total) by mouth daily. 90 tablet 3   NON FORMULARY Take 1 Dose by mouth See admin instructions. MMW: 3 parts Maalox 2 parts Benadryl  1 part viscious lidicaine  Disp. 6oz  Instructions: 5ml swish and swallow every 3-4 hours     omeprazole  (PRILOSEC) 40 MG capsule Take 1 capsule (40 mg total) by mouth 2 (two) times daily. 60 capsule 5   ondansetron  (ZOFRAN -ODT) 4 MG disintegrating tablet Take 1 tablet (4 mg total) by mouth every 8 (  eight) hours as needed for nausea or vomiting. 90 tablet 3   polyethylene glycol powder (GLYCOLAX /MIRALAX ) 17 GM/SCOOP powder Take 1 Container by mouth daily.     potassium chloride  SA (KLOR-CON  M) 20 MEQ tablet TAKE 1 TABLET TWICE A DAY (Patient taking differently: Take 20 mEq by mouth daily.) 180 tablet 3   Probiotic Product (PROBIOTIC DAILY PO) Take 1 tablet by mouth daily.     prochlorperazine  (COMPAZINE ) 10 MG tablet Take 1 tablet (10 mg total) by mouth every 6 (six) hours as needed for nausea or vomiting. 90 tablet 3   sucralfate  (CARAFATE ) 1 g tablet Take 1 tablet (1 g total) by mouth 4 (four) times daily -  with meals and at bedtime. 100 tablet 5   traMADol  (ULTRAM ) 50 MG tablet Take 1 tablet (50 mg total) by mouth every 6 (six) hours as needed. 30 tablet 0   valsartan (DIOVAN) 160 MG tablet Take 160 mg by mouth daily.     No current facility-administered medications for this visit.   I,Keanan Melander H Divonte Senger,acting as a scribe for Mackenzie VEAR Cornish, MD.,have documented all relevant documentation on the behalf of Mackenzie VEAR Cornish, MD,as directed by  Mackenzie VEAR Cornish, MD while in the presence of Mackenzie VEAR Cornish, MD.  I have reviewed this report as typed by the medical scribe, and it is complete and accurate.

## 2024-01-27 MED ORDER — METOPROLOL SUCCINATE ER 50 MG PO TB24: 50.0000 mg | ORAL_TABLET | Freq: Every day | ORAL | 3 refills | Status: AC

## 2024-01-31 ENCOUNTER — Inpatient Hospital Stay

## 2024-01-31 ENCOUNTER — Encounter: Payer: Self-pay | Admitting: Oncology

## 2024-01-31 VITALS — BP 133/76 | HR 83 | Temp 98.4°F | Resp 18

## 2024-01-31 DIAGNOSIS — R7989 Other specified abnormal findings of blood chemistry: Secondary | ICD-10-CM

## 2024-01-31 DIAGNOSIS — E860 Dehydration: Secondary | ICD-10-CM

## 2024-01-31 DIAGNOSIS — Z5111 Encounter for antineoplastic chemotherapy: Secondary | ICD-10-CM | POA: Diagnosis not present

## 2024-01-31 DIAGNOSIS — E876 Hypokalemia: Secondary | ICD-10-CM

## 2024-01-31 DIAGNOSIS — C168 Malignant neoplasm of overlapping sites of stomach: Secondary | ICD-10-CM

## 2024-01-31 LAB — CMP (CANCER CENTER ONLY)
ALT: 20 U/L (ref 0–44)
AST: 23 U/L (ref 15–41)
Albumin: 4 g/dL (ref 3.5–5.0)
Alkaline Phosphatase: 90 U/L (ref 38–126)
Anion gap: 10 (ref 5–15)
BUN: 19 mg/dL (ref 8–23)
CO2: 23 mmol/L (ref 22–32)
Calcium: 9 mg/dL (ref 8.9–10.3)
Chloride: 105 mmol/L (ref 98–111)
Creatinine: 1.25 mg/dL — ABNORMAL HIGH (ref 0.44–1.00)
GFR, Estimated: 42 mL/min — ABNORMAL LOW (ref >60)
Glucose, Bld: 148 mg/dL — ABNORMAL HIGH (ref 70–99)
Potassium: 4 mmol/L (ref 3.5–5.1)
Sodium: 138 mmol/L (ref 135–145)
Total Bilirubin: 0.3 mg/dL (ref 0.0–1.2)
Total Protein: 6.2 g/dL — ABNORMAL LOW (ref 6.5–8.1)

## 2024-01-31 LAB — CBC WITH DIFFERENTIAL (CANCER CENTER ONLY)
Abs Immature Granulocytes: 0.05 K/uL (ref 0.00–0.07)
Basophils Absolute: 0.1 K/uL (ref 0.0–0.1)
Basophils Relative: 0 %
Eosinophils Absolute: 0.2 K/uL (ref 0.0–0.5)
Eosinophils Relative: 2 %
HCT: 35.2 % — ABNORMAL LOW (ref 36.0–46.0)
Hemoglobin: 11.1 g/dL — ABNORMAL LOW (ref 12.0–15.0)
Immature Granulocytes: 0 %
Lymphocytes Relative: 17 %
Lymphs Abs: 2 K/uL (ref 0.7–4.0)
MCH: 25.7 pg — ABNORMAL LOW (ref 26.0–34.0)
MCHC: 31.5 g/dL (ref 30.0–36.0)
MCV: 81.5 fL (ref 80.0–100.0)
Monocytes Absolute: 0.8 K/uL (ref 0.1–1.0)
Monocytes Relative: 7 %
Neutro Abs: 8.5 K/uL — ABNORMAL HIGH (ref 1.7–7.7)
Neutrophils Relative %: 74 %
Platelet Count: 252 K/uL (ref 150–400)
RBC: 4.32 MIL/uL (ref 3.87–5.11)
RDW: 15.9 % — ABNORMAL HIGH (ref 11.5–15.5)
WBC Count: 11.6 K/uL — ABNORMAL HIGH (ref 4.0–10.5)
nRBC: 0 % (ref 0.0–0.2)

## 2024-01-31 MED ORDER — SODIUM CHLORIDE 0.9 % IV SOLN: Freq: Once | INTRAVENOUS | Status: AC

## 2024-01-31 MED ORDER — DEXAMETHASONE SOD PHOSPHATE PF 10 MG/ML IJ SOLN
8.0000 mg | Freq: Once | INTRAMUSCULAR | Status: AC
  Administered 2024-01-31: 15:00:00 8 mg via INTRAVENOUS

## 2024-01-31 MED ORDER — ONDANSETRON HCL 4 MG/2ML IJ SOLN
8.0000 mg | Freq: Once | INTRAMUSCULAR | Status: AC
  Administered 2024-01-31: 15:00:00 8 mg via INTRAVENOUS
  Filled 2024-01-31: qty 4

## 2024-01-31 NOTE — Patient Instructions (Signed)
 Dehydration, Adult Dehydration is a condition in which there is not enough water or other fluids in the body. This happens when a person loses more fluids than they take in. Important organs cannot work right without the right amount of fluids. Any loss of fluids from the body can cause dehydration. Dehydration can be mild, worse, or very bad. It should be treated right away to keep it from getting very bad. What are the causes? Conditions that cause loss of water in the body. They include: Watery poop (diarrhea). Vomiting. Sweating a lot. Fever. Infection. Peeing (urinating) a lot. Not drinking enough fluids. Certain medicines, such as medicines that take extra fluid out of the body (diuretics). Lack of safe drinking water. Not being able to get enough water and food. What increases the risk? Having a long-term (chronic) illness that has not been treated the right way, such as: Diabetes. Heart disease. Kidney disease. Being 25 years of age or older. Having a disability. Living in a place that is high above the ground or sea (high in altitude). The thinner, drier air causes more fluid loss. Doing exercises that put stress on your body for a long time. Being active when in hot places. What are the signs or symptoms? Symptoms of dehydration depend on how bad it is. Mild or worse dehydration Thirst. Dry lips or dry mouth. Feeling dizzy or light-headed. Muscle cramps. Passing little pee or dark pee. Pee may be the color of tea. Headache. Very bad dehydration Changes in skin. Skin may: Be cold to the touch (clammy). Be blotchy or pale. Not go back to normal right after you pinch it and let it go. Little or no tears, pee, or sweat. Fast breathing. Low blood pressure. Weak pulse. Pulse that is more than 100 beats a minute when you are sitting still. Other changes, such as: Feeling very thirsty. Eyes that look hollow (sunken). Cold hands and feet. Being confused. Being very  tired (lethargic) or having trouble waking from sleep. Losing weight. Loss of consciousness. How is this treated? Treatment for this condition depends on how bad your dehydration is. Treatment should start right away. Do not wait until your condition gets very bad. Very bad dehydration is an emergency. You will need to go to a hospital. Mild or worse dehydration can be treated at home. You may be asked to: Drink more fluids. Drink an oral rehydration solution (ORS). This drink gives you the right amount of fluids, salts, and minerals (electrolytes). Very bad dehydration can be treated: With fluids through an IV tube. By correcting low levels of electrolytes in the body. By treating the problem that caused your dehydration. Follow these instructions at home: Oral rehydration solution If told by your doctor, drink an ORS: Make an ORS. Use instructions on the package. Start by drinking small amounts, about  cup (120 mL) every 5-10 minutes. Slowly drink more until you have had the amount that your doctor said to have.  Eating and drinking  Drink enough clear fluid to keep your pee pale yellow. If you were told to drink an ORS, finish the ORS first. Then, start slowly drinking other clear fluids. Drink fluids such as: Water. Do not drink only water. Doing that can make the salt (sodium) level in your body get too low. Water from ice chips you suck on. Fruit juice that you have added water to (diluted). Low-calorie sports drinks. Eat foods that have the right amounts of salts and minerals, such as bananas, oranges, potatoes,  tomatoes, or spinach. Do not drink alcohol. Avoid drinks that have caffeine or sugar. These include:: High-calorie sports drinks. Fruit juice that you did not add water to. Soda. Coffee or energy drinks. Avoid foods that are greasy or have a lot of fat or sugar. General instructions Take over-the-counter and prescription medicines only as told by your doctor. Do  not take sodium tablets. Doing that can make the salt level in your body get too high. Return to your normal activities as told by your doctor. Ask your doctor what activities are safe for you. Keep all follow-up visits. Your doctor may check and change your treatment. Contact a doctor if: You have pain in your belly (abdomen) and the pain: Gets worse. Stays in one place. You have a rash. You have a stiff neck. You get angry or annoyed more easily than normal. You are more tired or have a harder time waking than normal. You feel weak or dizzy. You feel very thirsty. Get help right away if: You have any symptoms of very bad dehydration. You vomit every time you eat or drink. Your vomiting gets worse, does not go away, or you vomit blood or green stuff. You are getting treatment, but symptoms are getting worse. You have a fever. You have a very bad headache. You have: Diarrhea that gets worse or does not go away. Blood in your poop (stool). This may cause poop to look black and tarry. No pee in 6-8 hours. Only a small amount of pee in 6-8 hours, and the pee is very dark. You have trouble breathing. These symptoms may be an emergency. Get help right away. Call 911. Do not wait to see if the symptoms will go away. Do not drive yourself to the hospital. This information is not intended to replace advice given to you by your health care provider. Make sure you discuss any questions you have with your health care provider. Document Revised: 08/31/2021 Document Reviewed: 08/31/2021 Elsevier Patient Education  2024 [employer].

## 2024-02-01 DIAGNOSIS — H26491 Other secondary cataract, right eye: Secondary | ICD-10-CM | POA: Diagnosis not present

## 2024-02-02 ENCOUNTER — Other Ambulatory Visit: Payer: Self-pay | Admitting: Hematology and Oncology

## 2024-02-02 MED ORDER — LORAZEPAM 1 MG PO TABS: 1.0000 mg | ORAL_TABLET | Freq: Three times a day (TID) | ORAL | 0 refills | Status: DC

## 2024-02-07 ENCOUNTER — Inpatient Hospital Stay

## 2024-02-07 ENCOUNTER — Inpatient Hospital Stay (HOSPITAL_BASED_OUTPATIENT_CLINIC_OR_DEPARTMENT_OTHER): Admitting: Oncology

## 2024-02-07 VITALS — BP 130/68 | HR 93 | Temp 97.8°F | Resp 18 | Ht 64.0 in | Wt 207.1 lb

## 2024-02-07 DIAGNOSIS — C168 Malignant neoplasm of overlapping sites of stomach: Secondary | ICD-10-CM

## 2024-02-07 DIAGNOSIS — E860 Dehydration: Secondary | ICD-10-CM

## 2024-02-07 DIAGNOSIS — R7989 Other specified abnormal findings of blood chemistry: Secondary | ICD-10-CM

## 2024-02-07 DIAGNOSIS — Z5111 Encounter for antineoplastic chemotherapy: Secondary | ICD-10-CM | POA: Diagnosis not present

## 2024-02-07 DIAGNOSIS — E876 Hypokalemia: Secondary | ICD-10-CM

## 2024-02-07 LAB — CBC WITH DIFFERENTIAL (CANCER CENTER ONLY)
Abs Immature Granulocytes: 0.04 K/uL (ref 0.00–0.07)
Basophils Absolute: 0.1 K/uL (ref 0.0–0.1)
Basophils Relative: 0 %
Eosinophils Absolute: 0.3 K/uL (ref 0.0–0.5)
Eosinophils Relative: 3 %
HCT: 33.3 % — ABNORMAL LOW (ref 36.0–46.0)
Hemoglobin: 10.9 g/dL — ABNORMAL LOW (ref 12.0–15.0)
Immature Granulocytes: 0 %
Lymphocytes Relative: 18 %
Lymphs Abs: 2.1 K/uL (ref 0.7–4.0)
MCH: 26 pg (ref 26.0–34.0)
MCHC: 32.7 g/dL (ref 30.0–36.0)
MCV: 79.3 fL — ABNORMAL LOW (ref 80.0–100.0)
Monocytes Absolute: 1.1 K/uL — ABNORMAL HIGH (ref 0.1–1.0)
Monocytes Relative: 9 %
Neutro Abs: 7.9 K/uL — ABNORMAL HIGH (ref 1.7–7.7)
Neutrophils Relative %: 70 %
Platelet Count: 257 K/uL (ref 150–400)
RBC: 4.2 MIL/uL (ref 3.87–5.11)
RDW: 15.8 % — ABNORMAL HIGH (ref 11.5–15.5)
WBC Count: 11.5 K/uL — ABNORMAL HIGH (ref 4.0–10.5)
nRBC: 0 % (ref 0.0–0.2)

## 2024-02-07 LAB — CMP (CANCER CENTER ONLY)
ALT: 20 U/L (ref 0–44)
AST: 21 U/L (ref 15–41)
Albumin: 4 g/dL (ref 3.5–5.0)
Alkaline Phosphatase: 93 U/L (ref 38–126)
Anion gap: 11 (ref 5–15)
BUN: 21 mg/dL (ref 8–23)
CO2: 24 mmol/L (ref 22–32)
Calcium: 9.2 mg/dL (ref 8.9–10.3)
Chloride: 101 mmol/L (ref 98–111)
Creatinine: 1.33 mg/dL — ABNORMAL HIGH (ref 0.44–1.00)
GFR, Estimated: 39 mL/min — ABNORMAL LOW
Glucose, Bld: 108 mg/dL — ABNORMAL HIGH (ref 70–99)
Potassium: 3.3 mmol/L — ABNORMAL LOW (ref 3.5–5.1)
Sodium: 136 mmol/L (ref 135–145)
Total Bilirubin: 0.3 mg/dL (ref 0.0–1.2)
Total Protein: 6 g/dL — ABNORMAL LOW (ref 6.5–8.1)

## 2024-02-07 MED ORDER — DEXAMETHASONE SOD PHOSPHATE PF 10 MG/ML IJ SOLN
8.0000 mg | Freq: Once | INTRAMUSCULAR | Status: AC
  Administered 2024-02-07: 8 mg via INTRAVENOUS

## 2024-02-07 MED ORDER — ONDANSETRON HCL 4 MG/2ML IJ SOLN
8.0000 mg | Freq: Once | INTRAMUSCULAR | Status: AC
  Administered 2024-02-07: 8 mg via INTRAVENOUS
  Filled 2024-02-07: qty 4

## 2024-02-07 MED ORDER — SODIUM CHLORIDE 0.9 % IV SOLN: Freq: Once | INTRAVENOUS | Status: AC

## 2024-02-07 MED ORDER — POTASSIUM CHLORIDE 10 MEQ/100ML IV SOLN
10.0000 meq | Freq: Once | INTRAVENOUS | Status: AC
  Administered 2024-02-07: 10 meq via INTRAVENOUS
  Filled 2024-02-07: qty 100

## 2024-02-07 NOTE — Progress Notes (Signed)
 " [facility]  [address] 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  Stain for HER2 was negative.  PET scan revealed omental involvement. She received palliative chemotherapy with FOLFOX/nivolumab  (5-fluorouracil /leucovorin /oxaliplatin /nivolumab ). Oxaliplatin  was discontinued after 11 cycles, and she continues 5-fluorouracil /leucovorin /nivolumab .  PET in February 2024 revealed resolution of metabolic activity associated with the stomach.  Decrease in size of omental nodularity with no associated metabolic activity.   EGD in March revealed residual disease. CT imaging every few months has remained stable. She has had chronic kidney disease since developing immune mediated nephritis in February 2024, which resolved with high-dose steroids and holding nivolumab .  She receives extra IV fluids weekly to prevent recurrent acute kidney injury. 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 Her disease remains stable for the last 2 years with palliative chemotherapy, but now has progressed. CT scan and EGD of November 2025 confirm progression of disease and we have stopped her current chemotherapy regimen. We discussed options of further chemotherapy, but she prefers to go with comfort care after discussing the risks and benefits. We did discuss hospice care and what services they offer. We discussed code status and she wishes DNR in the event of a cardiorespiratory arrest. We will provide palliative care.   Drug-induced interstitial nephritis History immune mediated nephritis from immunotherapy in February, which resolved with prednisone .  She has had chronic kidney disease since that episode.  She will continue to  get weekly IV fluids along with antiemetics and steroids. She knows to push oral fluids.  Her creatinine increased to 1.33 today.    Acute deep vein thrombosis (DVT) of popliteal vein of right lower extremity (HCC) Occlusive deep venous thrombosis along the popliteal and peroneal vein diagnosed in March 2025. She was placed on apixaban  10 mg twice daily for 7 days, then apixaban  5 mg twice daily indefinitely.  Aspirin 81 mg daily was discontinued.  The patient states she continues apixaban  5 mg twice daily without difficulty.  Cardiac Arrhthymias EKG shows occasional PVC's but she was in trigeminy when I listened during her exam. She is essentially asymptomatic from this but we will run it by Dr. Bernie. She continues to have PVC's fairly often but is being monitored by Dr. Krasowski and he has prescribed metoprolol  50mg  once daily.   Hypokalemia She will increase her oral potassium back up to BID but I will also give her 10meq of IV potassium.   Plan: She continues Mylanta without difficulty. She has not taken pain medication but she does have some if it is needed. She has a elevated WBC of 11.5, low hemoglobin of 10.9 down from 11.1, and platelet count of 257,000. Her CMP is fairly normal other than a low potassium of 3.3, total protein of 6.0, and elevated creatinine of 1.33. She currently takes oral potassium once daily and I instructed her to increase this to BID if tolerated. She will receive 1L of normal saline, 8 mg IV dexamethasone , 8 mg IV Zofran , and 10 meq IV potassium. She will return in 1 week for IV fluids and [name] back in 2 weeks with CBC and CMP. The patient understands the plans discussed today and is in agreement.  She knows to contact our office if she  develops concerns prior to her next appointment.  I provided 22 minutes of face-to-face time during this encounter and > 50% was spent counseling as documented under my assessment and plan.   [name], MD   [facility] DE[facility] OF [facility] [address] [place] [address] Dept: [phone] Dept Fax: [phone]   No orders of the defined types were placed in this encounter.   CHIEF COMPLAINT:  CC: Stage IVB gastric cancer  Current Treatment: Palliative care  HISTORY OF PRESENT ILLNESS:  Mackenzie Lewis is a 88 y.o. female with stage IVB (T4b, N0, M1) gastric cancer diagnosed in July 2023.  She was referred by Dr. Velinda Fanti for assessment and management.  She had noticed that she was having regurgitation when eating and had lost over 30 pounds.  An ultrasound was done, revealing hepatic steatosis, and led to an MRI scan in June, which revealed gastric wall thickening with confluent nodularity of the omentum anterior to the stomach measuring 3.5 cm consistent with metastatic tumor.  She also had low-grade edema and wall thickening extending into the duodenum from the stomach.  She was referred to Dr. Velinda Fanti and he did an EGD in July.  This revealed a large ulceration measuring 1.2 cm along the greater curvature.  She also had diffusely edematous and erythematous wall with erosions of the antrum and stiff and friable mucosa with oozing of blood.  These findings extended to the gastric fundus as well.  Pathology revealed a poorly differentiated adenocarcinoma with signet ring features from the biopsies of the ulcer as well as the antrum and the fundus of the stomach.  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  She was placed on omeprazole .  Her test for H. pylori was negative.  She was referred to Dr. Charlie Sar for consideration of surgery, but he felt this was not resectable because of the extensive involvement.  We consider this extending to the duodenum and metastatic to the omentum.  PET scan confirmed these findings.  She wished to pursue systemic intravenous therapy.  CEA  and CA 19-9 were normal. She has been receiving palliative FOLFOX/nivolumab , which now consists of 5-fluorouracil /leucovorin /nivolumab .  Oxaliplatin  was discontinued after 11 cycles.  She has tolerated this fairly well.  CT scan and EGD of November, 2025 confirm progression of disease and we have stopped her current chemotherapy regimen. We discussed options of further chemotherapy, but she prefers to go with comfort care after discussing the risks and benefits. We discussed code status and she wishes DNR in the event of a cardiorespiratory arrest. We will provide palliative care.   Oncology History  Gastric cancer (HCC)  09/10/2021 Initial Diagnosis   Gastric cancer (HCC)   09/10/2021 Cancer Staging   Staging form: Stomach, AJCC 8th Edition - Clinical stage from 09/10/2021: Stage IVB (cT4b, cN0, cM1) - Signed by Cornish Wanda VEAR, MD on 09/10/2021 Histopathologic type: Adenocarcinoma, NOS Stage prefix: Initial diagnosis Total positive nodes: 0 Histologic grade (G): G3 Histologic grading system: 3 grade system Sites of metastasis: Peritoneal surface Diagnostic confirmation: Positive histology PLUS positive immunophenotyping and/or positive genetic studies Specimen type: Endoscopy with Biopsy Staged by: Managing physician Carcinoembryonic antigen (CEA) (ng/mL): 2.8 Carbohydrate antigen 19-9 (CA 19-9) (U/mL): 4.9 HER2 status: Unknown Microsatellite instability (MSI): Unknown Tumor location in stomach: Other Clinical staging modalities: Biopsy, Endoscopy Stage used in treatment planning: Yes National guidelines used in  treatment planning: Yes Type of national guideline used in treatment planning: NCCN   09/28/2021 - 10/14/2021 Chemotherapy   Patient is on Treatment Plan : GASTROESOPHAGEAL FOLFOX + Nivolumab  q14d     09/28/2021 - 12/29/2023 Chemotherapy   Patient is on Treatment Plan : GASTROESOPHAGEAL FOLFOX + Nivolumab  q21d (changed from q14d on 04/12/23)     12/09/2021 Genetic Testing    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  Report date is 12/09/2021.   The Multi-Cancer + RNA Panel offered by [facility] includes sequencing and/or deletion/duplication analysis of the following 84 genes:  AIP*, ALK, APC*, ATM*, AXIN2*, BAP1*, BARD1*, BLM*, BMPR1A*, BRCA1*, BRCA2*, BRIP1*, CASR, CDC73*, CDH1*, CDK4, CDKN1B*, CDKN1C*, CDKN2A, CEBPA, CHEK2*, CTNNA1*, DICER1*, DIS3L2*, EGFR, EPCAM, FH*, FLCN*, GATA2*, GPC3, GREM1, HOXB13, HRAS, KIT, MAX*, MEN1*, MET, MITF, MLH1*, MSH2*, MSH3*, MSH6*, MUTYH*, NBN*, NF1*, NF2*, NTHL1*, PALB2*, PDGFRA, PHOX2B, PMS2*, POLD1*, POLE*, POT1*, PRKAR1A*, PTCH1*, PTEN*, RAD50*, RAD51C*, RAD51D*, RB1*, RECQL4, RET, RUNX1*, SDHA*, SDHAF2*, SDHB*, SDHC*, SDHD*, SMAD4*, SMARCA4*, SMARCB1*, SMARCE1*, STK11*, SUFU*, TERC, TERT, TMEM127*, Tp53*, TSC1*, TSC2*, VHL*, WRN*, and WT1.  RNA analysis is performed for * genes.     INTERVAL HISTORY:  Sunjai is here today for repeat clinical assessment for her stage IVB gastric cancer, which has progressed on chemotherapy/immunotherapy now so it has been stopped. Patient states that she feels ok but complains of an uncomfortable feeling in her stomach. She continues Mylanta without difficulty. She has not taken pain medication but she does have some if it is needed. She has a elevated WBC of 11.5, low hemoglobin of 10.9 down from 11.1, and platelet count of 257,000. Her CMP is fairly normal other than a low potassium of 3.3, total protein of 6.0, and elevated creatinine of 1.33. She currently takes oral potassium once daily and I instructed her to increase this to BID if tolerated. She will receive 1L of normal saline, 8 mg IV dexamethasone , 8 mg IV Zofran , and 10 meq IV potassium. She will return in 1 week for IV fluids and [name] back in 2 weeks with CBC and CMP.  She denies fever, chills, night sweats, or other signs of infection. She denies cardiorespiratory issues. Her appetite is  ok and Her weight has decreased 1 pounds over last 2 weeks. She is accompanied by her daughter.  REVIEW OF SYSTEMS:  Review of Systems  Constitutional:  Positive for appetite change (early satiety, ok). Negative for chills, diaphoresis, fatigue, fever and unexpected weight change.  HENT:  Negative.  Negative for hearing loss, lump/mass, mouth sores, nosebleeds, sore throat, tinnitus, trouble swallowing and voice change.   Eyes: Negative.   Respiratory: Negative.  Negative for chest tightness, cough, hemoptysis, shortness of breath and wheezing.   Cardiovascular: Negative.  Negative for chest pain, leg swelling and palpitations.  Gastrointestinal:  Positive for abdominal pain (gnawing pain/discomfort), diarrhea (improved), nausea (intermittent, mild) and vomiting (1 episode). Negative for abdominal distention, blood in stool, constipation and rectal pain.  Endocrine: Negative.  Negative for hot flashes.  Genitourinary:  Negative for bladder incontinence, difficulty urinating, dyspareunia, dysuria, frequency, hematuria, menstrual problem, nocturia, pelvic pain, vaginal bleeding and vaginal discharge.   Musculoskeletal:  Positive for arthralgias, back pain (aching pain) and gait problem (due to left foot and ankle pain). Negative for flank pain, myalgias, neck pain and neck stiffness.       Shoulder pain  Skin: Negative.  Negative for itching and rash.  Neurological:  Positive for extremity weakness (generalized  weakness), gait problem (due to left foot and ankle pain) and numbness (neuropathy feet). Negative for dizziness, headaches, light-headedness, seizures and speech difficulty.       Restless leg syndrome   Hematological: Negative.  Negative for adenopathy. Does not bruise/bleed easily.  Psychiatric/Behavioral:  Positive for sleep disturbance. Negative for confusion, decreased concentration, depression and suicidal ideas. The patient is not nervous/anxious.   All other systems reviewed and are  negative.   VITALS:  Blood pressure 130/68, pulse 93, temperature 97.8 F (36.6 C), temperature source Oral, resp. rate 18, height 5' 4 (1.626 m), weight 207 lb 1.6 oz (93.9 kg), SpO2 97%.  Wt Readings from Last 3 Encounters:  02/07/24 207 lb 1.6 oz (93.9 kg)  01/24/24 208 lb (94.3 kg)  01/17/24 207 lb 11.2 oz (94.2 kg)    Body mass index is 35.55 kg/m.  Performance status (ECOG): 1 - Symptomatic but completely ambulatory  PHYSICAL EXAM:  Physical Exam Vitals and nursing note reviewed. Exam conducted with a chaperone present.  Constitutional:      General: She is not in acute distress.    Appearance: Normal appearance. She is normal weight. She is not diaphoretic.  HENT:     Head: Normocephalic and atraumatic.     Right Ear: Tympanic membrane, ear canal and external ear normal. There is no impacted cerumen.     Left Ear: Tympanic membrane, ear canal and external ear normal. There is no impacted cerumen.     Nose: Nose normal. No congestion or rhinorrhea.     Mouth/Throat:     Mouth: Mucous membranes are moist.     Pharynx: Oropharynx is clear. No oropharyngeal exudate or posterior oropharyngeal erythema.  Eyes:     General: No scleral icterus.    Extraocular Movements: Extraocular movements intact.     Conjunctiva/sclera: Conjunctivae normal.     Pupils: Pupils are equal, round, and reactive to light.  Cardiovascular:     Rate and Rhythm: Normal rate and regular rhythm.     Pulses: Normal pulses.     Heart sounds: Normal heart sounds. No murmur heard.    No friction rub. No gallop.  Pulmonary:     Effort: Pulmonary effort is normal.     Breath sounds: Normal breath sounds. No wheezing, rhonchi or rales.  Abdominal:     General: Bowel sounds are normal. There is no distension.     Palpations: Abdomen is soft. There is no hepatomegaly, splenomegaly or mass.     Tenderness: There is abdominal tenderness in the epigastric area.     Comments: Firmness in the upper mid  abdomen, stable.   Musculoskeletal:        General: Normal range of motion.     Cervical back: Normal range of motion and neck supple. No tenderness.     Right lower leg: Edema (mild) present.     Left lower leg: Edema (mild) present.     Left ankle: No swelling. No tenderness.  Lymphadenopathy:     Cervical: No cervical adenopathy.     Upper Body:     Right upper body: No supraclavicular or axillary adenopathy.     Left upper body: No supraclavicular or axillary adenopathy.     Lower Body: No right inguinal adenopathy. No left inguinal adenopathy.  Skin:    General: Skin is warm and dry.     Coloration: Skin is not jaundiced.     Findings: No rash.     Comments: Slight decrease in skin  turgor  Neurological:     General: No focal deficit present.     Mental Status: She is alert and oriented to person, place, and time. Mental status is at baseline.     Cranial Nerves: No cranial nerve deficit.  Psychiatric:        Mood and Affect: Mood normal.        Behavior: Behavior normal.        Thought Content: Thought content normal.        Judgment: Judgment normal.    LABS:      Latest Ref Rng & Units 02/14/2024    1:28 PM 02/07/2024   11:02 AM 01/31/2024    1:42 PM  CBC  WBC 4.0 - 10.5 K/uL 12.4  11.5  11.6   Hemoglobin 12.0 - 15.0 g/dL 88.9  89.0  88.8   Hematocrit 36.0 - 46.0 % 33.3  33.3  35.2   Platelets 150 - 400 K/uL 277  257  252       Latest Ref Rng & Units 02/14/2024    1:28 PM 02/07/2024   11:02 AM 01/31/2024    1:42 PM  CMP  Glucose 70 - 99 mg/dL 870  891  851   BUN 8 - 23 mg/dL 19  21  19    Creatinine 0.44 - 1.00 mg/dL 8.85  8.66  8.74   Sodium 135 - 145 mmol/L 136  136  138   Potassium 3.5 - 5.1 mmol/L 3.5  3.3  4.0   Chloride 98 - 111 mmol/L 101  101  105   CO2 22 - 32 mmol/L 24  24  23    Calcium  8.9 - 10.3 mg/dL 9.3  9.2  9.0   Total Protein 6.5 - 8.1 g/dL 6.2  6.0  6.2   Total Bilirubin 0.0 - 1.2 mg/dL 0.3  0.3  0.3   Alkaline Phos 38 - 126 U/L 99  93   90   AST 15 - 41 U/L 18  21  23    ALT 0 - 44 U/L 16  20  20     Lab Results  Component Value Date   TSH 3.300 01/17/2024   T4TOTAL 8.9 01/17/2024   Lab Results  Component Value Date   CEA1 2.9 09/10/2021   /  CEA  Date Value Ref Range Status  09/10/2021 2.9 0.0 - 4.7 ng/mL Final    Comment:    (NOTE)                             Nonsmokers          <3.9                             Smokers             <5.6 [employer] Electrochemiluminescence Immunoassay (ECLIA) Values obtained with different assay methods or kits cannot be used interchangeably.  Results cannot be interpreted as absolute evidence of the presence or absence of malignant disease. Performed At: [facility] [address] [place], [place] [id number] Jennette Shorter MD Ey:[phone]    STUDIES:  EXAM: 01/11/2024 CT CHEST, ABDOMEN, AND PELVIS WITH CONTRAST IMPRESSION: 1. Progressive wall thickening of the gastric antrum/pylorus with nodular areas of hyperenhancement along the superior margin, with progressive adjacent fat stranding and increased size of some soft tissue nodule/lymph nodes, as well as progressive nodular stranding along the  celiac axis, along the gastrohepatic ligament, and in the anterior omentum, and new focus of nodular thickening along the left pericolic gutter. Findings are compatible with recurrent/worsening gastric cancer with peritoneal carcinomatosis. 2. Progressive ill-defined nodular thickening along the lateral limb of the right adrenal gland measuring 16 mm on image 61/301 is suspicious for metastatic disease involvement. 3. Stable scattered left lower lobe pulmonary nodules common nonspecific suggest continued attention on follow-up imaging. No new suspicious pulmonary nodules or masses. 4. Similar symmetric distal esophageal wall thickening, which may reflect esophagitis.  Exam: 01/06/2024 Esophagogastroduodenoscopy with biopsy and esophageal  dilation Impression: Large ulcer with greater curvature in the stomach consistent with her prior gastric cancer.  Empiric esophageal dilation to 50 French.   Exam: 01/06/2024 Pathology: Diagnosis: Poorly differentiated adenocarcinoma with signet ring cell features in a background of ulceration.  EXAM: 09/29/2023 CT CHEST, ABDOMEN, AND PELVIS WITH CONTRAST IMPRESSION: 1. Redemonstration of irregular moderate thickening of the distal stomach body/pylorus, grossly similar to the prior study. There is associated mild perigastric fat stranding and multiple subcentimeter sized lymph nodes, grossly similar to the prior study from 06/30/2023 but progressed since the PET scan from 03/22/2022. These are concerning for locoregional metastatic disease. 2. No new metastatic disease identified within the chest, abdomen or pelvis. 3. Multiple other nonacute observations, as described above.   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  If that happens, she is to call us  so that we can schedule her for an ultrasound to look for synovitis.     Essential hypertension 09/09/2016   Gastric cancer (HCC) 09/10/2021   Hyperlipidemia 09/01/2016   Hypertension    Hypothyroidism 09/01/2016   Osteoarthritis of both feet 01/20/2016   Osteoarthritis, hand 01/20/2016   Thyroid  disease     Past Surgical History:  Procedure Laterality  Date   APPENDECTOMY     LAPAROSCOPIC APPENDECTOMY N/A 04/10/2016   Procedure: APPENDECTOMY LAPAROSCOPIC;  Surgeon: Vicenta Poli, MD;  Location: [facility] OR;  Service: General;  Laterality: N/A;    Family History  Problem Relation Age of Onset   Hypertension Mother    Prostate cancer Father        metastatic; d. 20   Brain cancer Sister 33   Breast cancer Sister 52   AAA (abdominal aortic aneurysm) Brother    Leukemia Cousin        x2 maternal female cousins; d. before 64   Breast cancer Daughter 47       DCIS    Social History:  reports that she has never smoked. She has never used smokeless tobacco. She reports that she does not currently use alcohol . She reports that she does not use drugs.   Allergies:  Allergies  Allergen Reactions   Doxycycline Rash   Sulfa Antibiotics Other (See Comments) and Rash    Other reaction(s): Other (See Comments)  Made me feel weird    Current Medications: Current Outpatient Medications  Medication Sig Dispense  Refill   folic acid (FOLVITE) 1 MG tablet Take 1 mg by mouth daily.     ELIQUIS  5 MG TABS tablet TAKE 1 TABLET TWICE A DAY 180 tablet 3   famotidine (PEPCID) 40 MG tablet Take 40 mg by mouth at bedtime.     Lactobacillus TABS Take 1 tablet by mouth 2 (two) times daily.     levothyroxine  (SYNTHROID , LEVOTHROID) 75 MCG tablet Take 75 mcg by mouth daily before breakfast.      loratadine (CLARITIN) 10 MG tablet Take 10 mg by mouth daily.     LORazepam  (ATIVAN ) 1 MG tablet Take 1 tablet (1 mg total) by mouth every 8 (eight) hours. 60 tablet 0   metoprolol  succinate (TOPROL  XL) 50 MG 24 hr tablet Take 1 tablet (50 mg total) by mouth daily. 90 tablet 3   NON FORMULARY Take 1 Dose by mouth See admin instructions. MMW: 3 parts Maalox 2 parts Benadryl  1 part viscious lidicaine  Disp. 6oz  Instructions: 5ml swish and swallow every 3-4 hours     omeprazole  (PRILOSEC) 40 MG capsule Take 1 capsule (40 mg total) by mouth 2 (two) times daily.  60 capsule 5   ondansetron  (ZOFRAN -ODT) 4 MG disintegrating tablet Take 1 tablet (4 mg total) by mouth every 8 (eight) hours as needed for nausea or vomiting. 90 tablet 3   polyethylene glycol powder (GLYCOLAX /MIRALAX ) 17 GM/SCOOP powder Take 1 Container by mouth daily.     potassium chloride  SA (KLOR-CON  M) 20 MEQ tablet TAKE 1 TABLET TWICE A DAY (Patient taking differently: Take 20 mEq by mouth daily.) 180 tablet 3   Probiotic Product (PROBIOTIC DAILY PO) Take 1 tablet by mouth daily.     prochlorperazine  (COMPAZINE ) 10 MG tablet Take 1 tablet (10 mg total) by mouth every 6 (six) hours as needed for nausea or vomiting. 90 tablet 3   sucralfate  (CARAFATE ) 1 g tablet Take 1 tablet (1 g total) by mouth 4 (four) times daily -  with meals and at bedtime. 100 tablet 5   traMADol  (ULTRAM ) 50 MG tablet Take 1 tablet (50 mg total) by mouth every 6 (six) hours as needed. 30 tablet 0   valsartan (DIOVAN) 160 MG tablet Take 160 mg by mouth daily.     No current facility-administered medications for this visit.   I,Shahira Fiske H Huy Majid,acting as a scribe for [name], MD.,have documented all relevant documentation on the behalf of [name], MD,as directed by  [name], MD while in the presence of [name], MD.  I have reviewed this report as typed by the medical scribe, and it is complete and accurate.  "

## 2024-02-14 ENCOUNTER — Inpatient Hospital Stay

## 2024-02-14 VITALS — BP 154/66 | HR 95 | Temp 97.5°F | Resp 18

## 2024-02-14 DIAGNOSIS — R7989 Other specified abnormal findings of blood chemistry: Secondary | ICD-10-CM

## 2024-02-14 DIAGNOSIS — E860 Dehydration: Secondary | ICD-10-CM

## 2024-02-14 DIAGNOSIS — E876 Hypokalemia: Secondary | ICD-10-CM

## 2024-02-14 DIAGNOSIS — C168 Malignant neoplasm of overlapping sites of stomach: Secondary | ICD-10-CM

## 2024-02-14 DIAGNOSIS — Z5111 Encounter for antineoplastic chemotherapy: Secondary | ICD-10-CM | POA: Diagnosis not present

## 2024-02-14 LAB — CBC WITH DIFFERENTIAL (CANCER CENTER ONLY)
Abs Immature Granulocytes: 0.05 K/uL (ref 0.00–0.07)
Basophils Absolute: 0 K/uL (ref 0.0–0.1)
Basophils Relative: 0 %
Eosinophils Absolute: 0.3 K/uL (ref 0.0–0.5)
Eosinophils Relative: 3 %
HCT: 33.3 % — ABNORMAL LOW (ref 36.0–46.0)
Hemoglobin: 11 g/dL — ABNORMAL LOW (ref 12.0–15.0)
Immature Granulocytes: 0 %
Lymphocytes Relative: 22 %
Lymphs Abs: 2.7 K/uL (ref 0.7–4.0)
MCH: 25.6 pg — ABNORMAL LOW (ref 26.0–34.0)
MCHC: 33 g/dL (ref 30.0–36.0)
MCV: 77.6 fL — ABNORMAL LOW (ref 80.0–100.0)
Monocytes Absolute: 1 K/uL (ref 0.1–1.0)
Monocytes Relative: 8 %
Neutro Abs: 8.2 K/uL — ABNORMAL HIGH (ref 1.7–7.7)
Neutrophils Relative %: 67 %
Platelet Count: 277 K/uL (ref 150–400)
RBC: 4.29 MIL/uL (ref 3.87–5.11)
RDW: 15.8 % — ABNORMAL HIGH (ref 11.5–15.5)
WBC Count: 12.4 K/uL — ABNORMAL HIGH (ref 4.0–10.5)
nRBC: 0 % (ref 0.0–0.2)

## 2024-02-14 LAB — CMP (CANCER CENTER ONLY)
ALT: 16 U/L (ref 0–44)
AST: 18 U/L (ref 15–41)
Albumin: 4 g/dL (ref 3.5–5.0)
Alkaline Phosphatase: 99 U/L (ref 38–126)
Anion gap: 11 (ref 5–15)
BUN: 19 mg/dL (ref 8–23)
CO2: 24 mmol/L (ref 22–32)
Calcium: 9.3 mg/dL (ref 8.9–10.3)
Chloride: 101 mmol/L (ref 98–111)
Creatinine: 1.14 mg/dL — ABNORMAL HIGH (ref 0.44–1.00)
GFR, Estimated: 47 mL/min — ABNORMAL LOW
Glucose, Bld: 129 mg/dL — ABNORMAL HIGH (ref 70–99)
Potassium: 3.5 mmol/L (ref 3.5–5.1)
Sodium: 136 mmol/L (ref 135–145)
Total Bilirubin: 0.3 mg/dL (ref 0.0–1.2)
Total Protein: 6.2 g/dL — ABNORMAL LOW (ref 6.5–8.1)

## 2024-02-14 MED ORDER — ONDANSETRON HCL 4 MG/2ML IJ SOLN
8.0000 mg | Freq: Once | INTRAMUSCULAR | Status: AC
  Administered 2024-02-14: 8 mg via INTRAVENOUS
  Filled 2024-02-14: qty 4

## 2024-02-14 MED ORDER — SODIUM CHLORIDE 0.9 % IV SOLN: Freq: Once | INTRAVENOUS | Status: AC

## 2024-02-14 MED ORDER — DEXAMETHASONE SOD PHOSPHATE PF 10 MG/ML IJ SOLN
8.0000 mg | Freq: Once | INTRAMUSCULAR | Status: AC
  Administered 2024-02-14: 8 mg via INTRAVENOUS

## 2024-02-14 NOTE — Patient Instructions (Signed)
 Dehydration, Adult Dehydration is a condition in which there is not enough water or other fluids in the body. This happens when a person loses more fluids than they take in. Important organs cannot work right without the right amount of fluids. Any loss of fluids from the body can cause dehydration. Dehydration can be mild, worse, or very bad. It should be treated right away to keep it from getting very bad. What are the causes? Conditions that cause loss of water in the body. They include: Watery poop (diarrhea). Vomiting. Sweating a lot. Fever. Infection. Peeing (urinating) a lot. Not drinking enough fluids. Certain medicines, such as medicines that take extra fluid out of the body (diuretics). Lack of safe drinking water. Not being able to get enough water and food. What increases the risk? Having a long-term (chronic) illness that has not been treated the right way, such as: Diabetes. Heart disease. Kidney disease. Being 25 years of age or older. Having a disability. Living in a place that is high above the ground or sea (high in altitude). The thinner, drier air causes more fluid loss. Doing exercises that put stress on your body for a long time. Being active when in hot places. What are the signs or symptoms? Symptoms of dehydration depend on how bad it is. Mild or worse dehydration Thirst. Dry lips or dry mouth. Feeling dizzy or light-headed. Muscle cramps. Passing little pee or dark pee. Pee may be the color of tea. Headache. Very bad dehydration Changes in skin. Skin may: Be cold to the touch (clammy). Be blotchy or pale. Not go back to normal right after you pinch it and let it go. Little or no tears, pee, or sweat. Fast breathing. Low blood pressure. Weak pulse. Pulse that is more than 100 beats a minute when you are sitting still. Other changes, such as: Feeling very thirsty. Eyes that look hollow (sunken). Cold hands and feet. Being confused. Being very  tired (lethargic) or having trouble waking from sleep. Losing weight. Loss of consciousness. How is this treated? Treatment for this condition depends on how bad your dehydration is. Treatment should start right away. Do not wait until your condition gets very bad. Very bad dehydration is an emergency. You will need to go to a hospital. Mild or worse dehydration can be treated at home. You may be asked to: Drink more fluids. Drink an oral rehydration solution (ORS). This drink gives you the right amount of fluids, salts, and minerals (electrolytes). Very bad dehydration can be treated: With fluids through an IV tube. By correcting low levels of electrolytes in the body. By treating the problem that caused your dehydration. Follow these instructions at home: Oral rehydration solution If told by your doctor, drink an ORS: Make an ORS. Use instructions on the package. Start by drinking small amounts, about  cup (120 mL) every 5-10 minutes. Slowly drink more until you have had the amount that your doctor said to have.  Eating and drinking  Drink enough clear fluid to keep your pee pale yellow. If you were told to drink an ORS, finish the ORS first. Then, start slowly drinking other clear fluids. Drink fluids such as: Water. Do not drink only water. Doing that can make the salt (sodium) level in your body get too low. Water from ice chips you suck on. Fruit juice that you have added water to (diluted). Low-calorie sports drinks. Eat foods that have the right amounts of salts and minerals, such as bananas, oranges, potatoes,  tomatoes, or spinach. Do not drink alcohol. Avoid drinks that have caffeine or sugar. These include:: High-calorie sports drinks. Fruit juice that you did not add water to. Soda. Coffee or energy drinks. Avoid foods that are greasy or have a lot of fat or sugar. General instructions Take over-the-counter and prescription medicines only as told by your doctor. Do  not take sodium tablets. Doing that can make the salt level in your body get too high. Return to your normal activities as told by your doctor. Ask your doctor what activities are safe for you. Keep all follow-up visits. Your doctor may check and change your treatment. Contact a doctor if: You have pain in your belly (abdomen) and the pain: Gets worse. Stays in one place. You have a rash. You have a stiff neck. You get angry or annoyed more easily than normal. You are more tired or have a harder time waking than normal. You feel weak or dizzy. You feel very thirsty. Get help right away if: You have any symptoms of very bad dehydration. You vomit every time you eat or drink. Your vomiting gets worse, does not go away, or you vomit blood or green stuff. You are getting treatment, but symptoms are getting worse. You have a fever. You have a very bad headache. You have: Diarrhea that gets worse or does not go away. Blood in your poop (stool). This may cause poop to look black and tarry. No pee in 6-8 hours. Only a small amount of pee in 6-8 hours, and the pee is very dark. You have trouble breathing. These symptoms may be an emergency. Get help right away. Call 911. Do not wait to see if the symptoms will go away. Do not drive yourself to the hospital. This information is not intended to replace advice given to you by your health care provider. Make sure you discuss any questions you have with your health care provider. Document Revised: 08/31/2021 Document Reviewed: 08/31/2021 Elsevier Patient Education  2024 [employer].

## 2024-02-17 ENCOUNTER — Encounter: Payer: Self-pay | Admitting: Oncology

## 2024-02-20 NOTE — Progress Notes (Signed)
 " [facility]  [address] [place],  [place]  [address] [phone]  Clinic Day:  02/21/2024  Referring physician: Jefferey Fitch, MD  ASSESSMENT & PLAN:   Assessment & Plan: Gastric cancer [facility]) Stage IVB (T4 N0 M1) poorly differentiated adenocarcinoma of the stomach with signet ring features and ulceration, metastatic to the omentum diagnosed in July 2023. Stain for HER2 was negative.  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 PET scan revealed omental involvement. She received palliative chemotherapy with FOLFOX/nivolumab  (5-fluorouracil /leucovorin /oxaliplatin /nivolumab ). Oxaliplatin  was discontinued after 11 cycles, and she continued 5-fluorouracil /leucovorin /nivolumab .  She has had fairly good control of her disease.  She developed immune mediated nephritis in February 2024, which resolved with high-dose steroids and holding nivolumab .  She has had chronic kidney disease since that time.  She receives extra IV fluids weekly to prevent recurrent acute kidney injury.  Unfortunately, CT chest, abdomen and pelvis, as well as EGD in November 2025 revealed progression of disease.  Chemoimmunotherapy was discontinued.  We discussed options of further chemotherapy, but she prefers to go with comfort care after discussing the risks and benefits. We did discuss hospice care and what services they offer. We discussed code status and she wished DNR in the event of a cardiorespiratory arrest. We continue to provide provide supportive/palliative care.  She  continues IV fluids weekly.  She is doing fairly well, but has had some increased upper abdominal pain.  She knows to use her tramadol  as needed and if this is not effective to contact our office.  Her creatinine continues to fluctuate up and down.  She will receive IV fluids today.  I will plan to see her back in 2 weeks with a CBC and comprehensive metabolic panel.  Drug-induced interstitial nephritis History immune mediated  nephritis from immunotherapy in February, which resolved with prednisone .  She has had chronic kidney disease since that episode.  She will continue to get weekly fluids.  She knows to push clear fluids.    Microcytic anemia Mild microcytic anemia.  She may have bleeding from her tumor.  Iron studies today reveal iron deficiency.  I will arrange for her to have IV iron when she is here for IV fluids next week.   Acute deep vein thrombosis (DVT) of popliteal vein of right lower extremity (HCC) Occlusive deep venous thrombosis along the popliteal and peroneal vein diagnosed in March 2025. She was placed on apixaban  10 mg twice daily for 7 days, then apixaban  5 mg twice daily indefinitely.  Aspirin 81 mg daily was discontinued.  The patient continues apixaban  5 mg twice daily without difficulty.    The patient understands the plans discussed today and is in agreement with them.  She knows to contact our office if she develops concerns prior to her next appointment.   I provided 20 minutes of face-to-face time during this encounter and > 50% was spent counseling as documented under my assessment and plan.    [facility] A Mackenzie Dunlap, PA-C  [facility] [address] [place] [address] Dept: [phone] Dept Fax: [phone]   Orders Placed This Encounter  Procedures   Ferritin    Standing Status:   Future    Number of Occurrences:   1    Expected Date:   02/21/2024    Expiration Date:   05/21/2024   Iron and TIBC    Standing Status:   Future  Number of Occurrences:   1    Expected Date:   02/21/2024    Expiration Date:   05/21/2024      CHIEF COMPLAINT:  CC: Stage IVB gastric cancer  Current Treatment: Supportive/palliative care  HISTORY OF PRESENT ILLNESS:   Oncology History  Gastric cancer (HCC)  09/10/2021 Initial Diagnosis   Gastric cancer (HCC)   09/10/2021 Cancer Staging   Staging form: Stomach,  AJCC 8th Edition - Clinical stage from 09/10/2021: Stage IVB (cT4b, cN0, cM1) - Signed by Cornelius Wanda DEL, MD on 09/10/2021 Histopathologic type: Adenocarcinoma, NOS Stage prefix: Initial diagnosis Total positive nodes: 0 Histologic grade (G): G3 Histologic grading system: 3 grade system Sites of metastasis: Peritoneal surface Diagnostic confirmation: Positive histology PLUS positive immunophenotyping and/or positive genetic studies Specimen type: Endoscopy with Biopsy Staged by: Managing physician Carcinoembryonic antigen (CEA) (ng/mL): 2.8 Carbohydrate antigen 19-9 (CA 19-9) (U/mL): 4.9 HER2 status: Unknown Microsatellite instability (MSI): Unknown Tumor location in stomach: Other Clinical staging modalities: Biopsy, Endoscopy Stage used in treatment planning: Yes National guidelines used in treatment planning: Yes Type of national guideline used in treatment planning: NCCN   09/28/2021 - 10/14/2021 Chemotherapy   Patient is on Treatment Plan : GASTROESOPHAGEAL FOLFOX + Nivolumab  q14d     09/28/2021 - 12/29/2023 Chemotherapy   Patient is on Treatment Plan : GASTROESOPHAGEAL FOLFOX + Nivolumab  q21d (changed from q14d on 04/12/23)     12/09/2021 Genetic Testing   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  Report date is 12/09/2021.   The Multi-Cancer + RNA Panel offered by [facility] includes sequencing and/or deletion/duplication analysis of the following 84 genes:  AIP*, ALK, APC*, ATM*, AXIN2*, BAP1*, BARD1*, BLM*, BMPR1A*, BRCA1*, BRCA2*, BRIP1*, CASR, CDC73*, CDH1*, CDK4, CDKN1B*, CDKN1C*, CDKN2A, CEBPA, CHEK2*, CTNNA1*, DICER1*, DIS3L2*, EGFR, EPCAM, FH*, FLCN*, GATA2*, GPC3, GREM1, HOXB13, HRAS, KIT, MAX*, MEN1*, MET, MITF, MLH1*, MSH2*, MSH3*, MSH6*, MUTYH*, NBN*, NF1*, NF2*, NTHL1*, PALB2*, PDGFRA, PHOX2B, PMS2*, POLD1*, POLE*, POT1*, PRKAR1A*, PTCH1*, PTEN*, RAD50*, RAD51C*, RAD51D*, RB1*, RECQL4, RET, RUNX1*, SDHA*, SDHAF2*, SDHB*,  SDHC*, SDHD*, SMAD4*, SMARCA4*, SMARCB1*, SMARCE1*, STK11*, SUFU*, TERC, TERT, TMEM127*, Tp53*, TSC1*, TSC2*, VHL*, WRN*, and WT1.  RNA analysis is performed for * genes.       INTERVAL HISTORY:   Mackenzie [facility] is here today for repeat clinical assessment.  She continues IV fluids weekly.  She states she has increased upper mid abdominal pain but really has not been taking any medication for this.  She did take tramadol  this morning with good relief.  She reports intermittent nausea, with the rare episodes of vomiting.  She reports abdominal cramping prior to bowel movements, but she denies constipation.  She denies fevers or chills. She denies pain. Her appetite is fairly good. She was not weighed today.  REVIEW OF SYSTEMS:   Review of Systems  Constitutional:  Positive for appetite change (Slightly decreased) and fatigue (Mild fatigue). Negative for chills, fever and unexpected weight change.  HENT:   Negative for lump/mass, mouth sores, nosebleeds and sore throat.   Respiratory:  Negative for cough, hemoptysis and shortness of breath.   Cardiovascular:  Negative for chest pain and leg swelling.  Gastrointestinal:  Positive for nausea (Intermittent). Negative for abdominal pain, blood in stool, constipation, diarrhea and vomiting.  Endocrine: Negative for hot flashes.  Genitourinary:  Negative for difficulty urinating, dysuria, frequency, hematuria and vaginal bleeding.   Musculoskeletal:  Positive for gait problem (Unsteady). Negative for arthralgias, back pain and myalgias.  Skin:  Negative for rash.  Neurological:  Positive for gait problem (Unsteady). Negative for dizziness, extremity weakness, headaches, light-headedness and numbness.  Hematological:  Negative for adenopathy. Does not bruise/bleed easily.  Psychiatric/Behavioral:  Negative for depression and sleep disturbance. The patient is not nervous/anxious.      VITALS:   Vital signs today include a temperature of 97.7, blood pressure  133/51, pulse 82, respirations 20 and pulse oximetry 95% on room air. Wt Readings from Last 3 Encounters:  02/07/24 207 lb 1.6 oz (93.9 kg)  01/24/24 208 lb (94.3 kg)  01/17/24 207 lb 11.2 oz (94.2 kg)    There is no height or weight on file to calculate BMI.  Performance status (ECOG): 1 - Symptomatic but completely ambulatory    PHYSICAL EXAM:   Physical Exam Vitals and nursing note reviewed.  Constitutional:      General: She is not in acute distress.    Appearance: Normal appearance. She is not ill-appearing.     Comments: She uses a cane to ambulate  HENT:     Head: Normocephalic and atraumatic.     Mouth/Throat:     Mouth: Mucous membranes are moist.     Pharynx: Oropharynx is clear. No oropharyngeal exudate or posterior oropharyngeal erythema.  Eyes:     General: No scleral icterus.    Extraocular Movements: Extraocular movements intact.     Conjunctiva/sclera: Conjunctivae normal.     Pupils: Pupils are equal, round, and reactive to light.  Cardiovascular:     Rate and Rhythm: Normal rate and regular rhythm.     Heart sounds: Normal heart sounds. No murmur heard.    No friction rub. No gallop.  Pulmonary:     Effort: Pulmonary effort is normal.     Breath sounds: Normal breath sounds. No wheezing, rhonchi or rales.  Abdominal:     General: There is no distension.     Palpations: Abdomen is soft. There is no mass.     Tenderness: There is abdominal tenderness in the epigastric area.  Musculoskeletal:        General: Normal range of motion.     Cervical back: Normal range of motion and neck supple. No tenderness.     Right lower leg: No edema.     Left lower leg: No edema.  Lymphadenopathy:     Cervical: No cervical adenopathy.     Upper Body:     Right upper body: No supraclavicular or axillary adenopathy.     Left upper body: No supraclavicular or axillary adenopathy.  Skin:    General: Skin is warm and dry.     Coloration: Skin is not jaundiced.      Findings: No rash.  Neurological:     Mental Status: She is alert and oriented to person, place, and time.     Cranial Nerves: No cranial nerve deficit.  Psychiatric:        Mood and Affect: Mood normal.        Behavior: Behavior normal.        Thought Content: Thought content normal.     LABS:      Latest Ref Rng & Units 02/21/2024   10:46 AM 02/14/2024    1:28 PM 02/07/2024   11:02 AM  CBC  WBC 4.0 - 10.5 K/uL 9.2  12.4  11.5   Hemoglobin 12.0 - 15.0 g/dL 88.7  88.9  89.0   Hematocrit 36.0 - 46.0 % 34.3  33.3  33.3   Platelets 150 - 400 K/uL 261  277  257       Latest Ref Rng & Units 02/21/2024   10:46 AM 02/14/2024    1:28 PM 02/07/2024   11:02 AM  CMP  Glucose 70 - 99 mg/dL 835  870  891   BUN 8 - 23 mg/dL 18  19  21    Creatinine 0.44 - 1.00 mg/dL 8.67  8.85  8.66   Sodium 135 - 145 mmol/L 139  136  136   Potassium 3.5 - 5.1 mmol/L 3.9  3.5  3.3   Chloride 98 - 111 mmol/L 104  101  101   CO2 22 - 32 mmol/L 24  24  24    Calcium  8.9 - 10.3 mg/dL 9.4  9.3  9.2   Total Protein 6.5 - 8.1 g/dL 6.4  6.2  6.0   Total Bilirubin 0.0 - 1.2 mg/dL 0.4  0.3  0.3   Alkaline Phos 38 - 126 U/L 91  99  93   AST 15 - 41 U/L 21  18  21    ALT 0 - 44 U/L 16  16  20       Lab Results  Component Value Date   CEA1 2.9 09/10/2021   /  CEA  Date Value Ref Range Status  09/10/2021 2.9 0.0 - 4.7 ng/mL Final    Comment:    (NOTE)                             Nonsmokers          <3.9                             Smokers             <5.6 [employer] Electrochemiluminescence Immunoassay (ECLIA) Values obtained with different assay methods or kits cannot be used interchangeably.  Results cannot be interpreted as absolute evidence of the presence or absence of malignant disease. Performed At: [facility] [address] [place], [place] [id number] Jennette Shorter MD Ey:[phone]     Lab Results  Component Value Date   TIBC 421 02/21/2024   FERRITIN 14 02/21/2024    IRONPCTSAT 9 (L) 02/21/2024     STUDIES:   No results found.    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  If that happens, she is to call us  so that we can schedule her for an ultrasound to look for synovitis.     Essential hypertension 09/09/2016   Gastric cancer (HCC) 09/10/2021   Hyperlipidemia 09/01/2016   Hypertension    Hypothyroidism 09/01/2016   Osteoarthritis of both feet 01/20/2016   Osteoarthritis, hand 01/20/2016   Thyroid  disease     Past Surgical History:  Procedure Laterality Date   APPENDECTOMY     LAPAROSCOPIC APPENDECTOMY N/A 04/10/2016   Procedure: APPENDECTOMY LAPAROSCOPIC;  Surgeon: Vicenta Poli, MD;  Location: [facility] OR;  Service: General;  Laterality: N/A;    Family History  Problem Relation Age of Onset   Hypertension Mother    Prostate cancer Father        metastatic; d. 56   Brain cancer Sister 47   Breast cancer Sister 90   AAA (abdominal aortic aneurysm) Brother    Leukemia Cousin        x2 maternal female cousins; d. before 34   Breast cancer Daughter 20       DCIS    Social History:  reports that she has never smoked. She has never used smokeless tobacco. She reports that she does not currently use alcohol . She reports that she does not use drugs.The patient is accompanied by her daughter and 2 friends today.  Allergies: Allergies[1]  Current Medications: Current  Outpatient Medications  Medication Sig Dispense Refill   ELIQUIS  5 MG TABS tablet TAKE 1 TABLET TWICE A DAY 180 tablet 3   famotidine (PEPCID) 40 MG tablet Take 40 mg by mouth at bedtime.     folic acid (FOLVITE) 1 MG tablet Take 1 mg by mouth daily.     Lactobacillus TABS Take 1 tablet by mouth 2 (two) times daily.     levothyroxine  (SYNTHROID , LEVOTHROID) 75 MCG tablet Take 75 mcg by mouth daily before breakfast.      loratadine (CLARITIN) 10 MG tablet Take 10 mg by mouth daily.     LORazepam  (ATIVAN ) 1 MG tablet Take 1 tablet (1 mg total) by mouth every 8 (eight) hours. 60 tablet 0   metoprolol  succinate (TOPROL  XL) 50 MG 24 hr tablet Take 1 tablet (50 mg total) by mouth daily. 90 tablet 3   NON FORMULARY Take 1 Dose by mouth See admin instructions. MMW: 3 parts Maalox 2 parts Benadryl  1 part viscious lidicaine  Disp. 6oz  Instructions: 5ml swish and swallow every 3-4 hours     omeprazole  (PRILOSEC) 40 MG capsule Take 1 capsule (40 mg total) by mouth 2 (two) times daily. 60 capsule 5   ondansetron  (ZOFRAN -ODT) 4 MG disintegrating tablet Take 1 tablet (4 mg total) by mouth every 8 (eight) hours as needed for nausea or vomiting. 90 tablet 3   polyethylene glycol powder (GLYCOLAX /MIRALAX ) 17 GM/SCOOP powder Take 1 Container by mouth daily.     potassium chloride  SA (KLOR-CON  M) 20 MEQ tablet TAKE 1 TABLET TWICE A DAY (Patient taking differently: Take 20 mEq by mouth daily.) 180 tablet 3   Probiotic Product (PROBIOTIC DAILY PO) Take 1 tablet by mouth daily.     prochlorperazine  (COMPAZINE ) 10 MG tablet Take 1 tablet (10 mg total) by mouth every 6 (six) hours as needed for nausea or vomiting. 90 tablet 3   sucralfate  (CARAFATE ) 1 g tablet Take 1 tablet (1 g total) by mouth 4 (four) times daily -  with meals and at bedtime. 100 tablet 5   traMADol  (ULTRAM ) 50 MG tablet Take 1 tablet (50 mg total) by mouth every 6 (six) hours as needed. 30 tablet 0   valsartan (DIOVAN) 160 MG tablet Take 160 mg  by mouth daily.     No current facility-administered medications for this visit.             [1]  Allergies Allergen Reactions   Doxycycline Rash   Sulfa Antibiotics Other (See Comments) and Rash    Other reaction(s): Other (See Comments)  Made me feel weird   "

## 2024-02-21 ENCOUNTER — Inpatient Hospital Stay: Admitting: Hematology and Oncology

## 2024-02-21 ENCOUNTER — Inpatient Hospital Stay: Attending: Hematology and Oncology

## 2024-02-21 ENCOUNTER — Inpatient Hospital Stay

## 2024-02-21 ENCOUNTER — Encounter: Payer: Self-pay | Admitting: Hematology and Oncology

## 2024-02-21 ENCOUNTER — Other Ambulatory Visit: Payer: Self-pay

## 2024-02-21 VITALS — BP 133/51 | HR 82 | Temp 97.7°F | Resp 20

## 2024-02-21 DIAGNOSIS — D509 Iron deficiency anemia, unspecified: Secondary | ICD-10-CM | POA: Diagnosis not present

## 2024-02-21 DIAGNOSIS — E876 Hypokalemia: Secondary | ICD-10-CM | POA: Diagnosis not present

## 2024-02-21 DIAGNOSIS — N142 Nephropathy induced by unspecified drug, medicament or biological substance: Secondary | ICD-10-CM

## 2024-02-21 DIAGNOSIS — N182 Chronic kidney disease, stage 2 (mild): Secondary | ICD-10-CM | POA: Insufficient documentation

## 2024-02-21 DIAGNOSIS — C169 Malignant neoplasm of stomach, unspecified: Secondary | ICD-10-CM | POA: Insufficient documentation

## 2024-02-21 DIAGNOSIS — I129 Hypertensive chronic kidney disease with stage 1 through stage 4 chronic kidney disease, or unspecified chronic kidney disease: Secondary | ICD-10-CM | POA: Diagnosis not present

## 2024-02-21 DIAGNOSIS — Z79899 Other long term (current) drug therapy: Secondary | ICD-10-CM | POA: Diagnosis not present

## 2024-02-21 DIAGNOSIS — R7989 Other specified abnormal findings of blood chemistry: Secondary | ICD-10-CM

## 2024-02-21 DIAGNOSIS — C168 Malignant neoplasm of overlapping sites of stomach: Secondary | ICD-10-CM

## 2024-02-21 DIAGNOSIS — E039 Hypothyroidism, unspecified: Secondary | ICD-10-CM | POA: Diagnosis not present

## 2024-02-21 DIAGNOSIS — E860 Dehydration: Secondary | ICD-10-CM

## 2024-02-21 DIAGNOSIS — I82431 Acute embolism and thrombosis of right popliteal vein: Secondary | ICD-10-CM

## 2024-02-21 LAB — CBC WITH DIFFERENTIAL (CANCER CENTER ONLY)
Abs Immature Granulocytes: 0.03 K/uL (ref 0.00–0.07)
Basophils Absolute: 0 K/uL (ref 0.0–0.1)
Basophils Relative: 0 %
Eosinophils Absolute: 0.2 K/uL (ref 0.0–0.5)
Eosinophils Relative: 3 %
HCT: 34.3 % — ABNORMAL LOW (ref 36.0–46.0)
Hemoglobin: 11.2 g/dL — ABNORMAL LOW (ref 12.0–15.0)
Immature Granulocytes: 0 %
Lymphocytes Relative: 19 %
Lymphs Abs: 1.8 K/uL (ref 0.7–4.0)
MCH: 25.6 pg — ABNORMAL LOW (ref 26.0–34.0)
MCHC: 32.7 g/dL (ref 30.0–36.0)
MCV: 78.3 fL — ABNORMAL LOW (ref 80.0–100.0)
Monocytes Absolute: 0.9 K/uL (ref 0.1–1.0)
Monocytes Relative: 10 %
Neutro Abs: 6.2 K/uL (ref 1.7–7.7)
Neutrophils Relative %: 68 %
Platelet Count: 261 K/uL (ref 150–400)
RBC: 4.38 MIL/uL (ref 3.87–5.11)
RDW: 15.5 % (ref 11.5–15.5)
WBC Count: 9.2 K/uL (ref 4.0–10.5)
nRBC: 0 % (ref 0.0–0.2)

## 2024-02-21 LAB — CMP (CANCER CENTER ONLY)
ALT: 16 U/L (ref 0–44)
AST: 21 U/L (ref 15–41)
Albumin: 3.9 g/dL (ref 3.5–5.0)
Alkaline Phosphatase: 91 U/L (ref 38–126)
Anion gap: 11 (ref 5–15)
BUN: 18 mg/dL (ref 8–23)
CO2: 24 mmol/L (ref 22–32)
Calcium: 9.4 mg/dL (ref 8.9–10.3)
Chloride: 104 mmol/L (ref 98–111)
Creatinine: 1.32 mg/dL — ABNORMAL HIGH (ref 0.44–1.00)
GFR, Estimated: 39 mL/min — ABNORMAL LOW
Glucose, Bld: 164 mg/dL — ABNORMAL HIGH (ref 70–99)
Potassium: 3.9 mmol/L (ref 3.5–5.1)
Sodium: 139 mmol/L (ref 135–145)
Total Bilirubin: 0.4 mg/dL (ref 0.0–1.2)
Total Protein: 6.4 g/dL — ABNORMAL LOW (ref 6.5–8.1)

## 2024-02-21 LAB — IRON AND TIBC
Iron: 38 ug/dL (ref 28–170)
Saturation Ratios: 9 % — ABNORMAL LOW (ref 10.4–31.8)
TIBC: 421 ug/dL (ref 250–450)
UIBC: 384 ug/dL

## 2024-02-21 LAB — FERRITIN: Ferritin: 14 ng/mL (ref 11–307)

## 2024-02-21 MED ORDER — SODIUM CHLORIDE 0.9 % IV SOLN: Freq: Once | INTRAVENOUS | Status: AC

## 2024-02-21 MED ORDER — ONDANSETRON HCL 4 MG/2ML IJ SOLN
8.0000 mg | Freq: Once | INTRAMUSCULAR | Status: AC
  Administered 2024-02-21: 8 mg via INTRAVENOUS
  Filled 2024-02-21: qty 4

## 2024-02-21 MED ORDER — DEXAMETHASONE SOD PHOSPHATE PF 10 MG/ML IJ SOLN
8.0000 mg | Freq: Once | INTRAMUSCULAR | Status: AC
  Administered 2024-02-21: 8 mg via INTRAVENOUS

## 2024-02-21 NOTE — Assessment & Plan Note (Signed)
 History immune mediated nephritis from immunotherapy in February, which resolved with prednisone .  She has had chronic kidney disease since that episode.  She will continue to get weekly fluids.  She knows to push clear fluids.

## 2024-02-21 NOTE — Patient Instructions (Signed)
 Dexamethasone  Injection What is this medication? DEXAMETHASONE  (dex a METH a sone) treats many conditions such as asthma, allergic reactions, arthritis, inflammatory bowel diseases, adrenal, and blood or bone marrow disorders. It works by decreasing inflammation and slowing down an overactive immune system. It belongs to a group of medications called steroids. This medicine may be used for other purposes; ask your health care provider or pharmacist if you have questions. COMMON BRAND NAME(S): Decadron , DEX24, DoubleDex, ReadySharp Dexamethasone , Simplist Dexamethasone , Solurex What should I tell my care team before I take this medication? They need to know if you have any of these conditions: Cushing syndrome Diabetes Glaucoma Heart attack Heart disease High blood pressure Infection, such as tuberculosis (TB), bacterial, fungal, or viral infections Kidney disease Liver disease Mental health condition Myasthenia gravis Osteoporosis Seizures Stomach or intestine problems Thyroid  disease An unusual or allergic reaction to dexamethasone , lactose, other medications, foods, dyes, or preservatives Pregnant or trying to get pregnant Breastfeeding How should I use this medication? This medication is injected into a muscle, joint, lesion, or other tissue. It is given by your care team in a hospital or clinic setting. Talk to your care team about the use of this medication in children. Special care may be needed. Overdosage: If you think you have taken too much of this medicine contact a poison control center or emergency room at once. NOTE: This medicine is only for you. Do not share this medicine with others. What if I miss a dose? This does not apply. What may interact with this medication? Do not take this medication with any of the following: Live virus vaccines This medication may also interact with the following: Aminoglutethimide Amphotericin B Aspirin and aspirin-like  medications Certain antibiotics, such as erythromycin, clarithromycin, troleandomycin Certain antivirals for HIV or hepatitis Certain medications for seizures, such as carbamazepine, phenobarbital , phenytoin  Certain medications to treat myasthenia gravis Cholestyramine Cyclosporine Digoxin Diuretics Ephedrine Estrogen and progestin hormones Insulin or other medications for diabetes Isoniazid Ketoconazole Medications that relax muscles for surgery Mifepristone NSAIDs, medications for pain and inflammation, such as ibuprofen or naproxen Rifampin Skin tests for allergies Thalidomide Vaccines Warfarin This list may not describe all possible interactions. Give your health care provider a list of all the medicines, herbs, non-prescription drugs, or dietary supplements you use. Also tell them if you smoke, drink alcohol, or use illegal drugs. Some items may interact with your medicine. What should I watch for while using this medication? Visit your care team for regular checks on your progress. Tell your care team if your symptoms do not start to get better or if they get worse. Your condition will be monitored carefully while you are receiving this medication. Wear a medical ID bracelet or chain. Carry a card that describes your condition. List the medications and doses you take on the card. This medication may increase your risk of getting an infection. Call your care team for advice if you get a fever, chills, sore throat, or other symptoms of a cold or flu. Do not treat yourself. Try to avoid being around people who are sick. If you have not had the measles or chickenpox vaccines, tell your care team right away if you are around someone with these viruses. If you are going to need surgery or other procedure, tell your care team that you are using this medication. You may need to be on a special diet while you are taking this medication. Ask your care team. Also, find out how many glasses of  fluids you need to drink each day. This medication may increase blood sugar. The risk may be higher in patients who already have diabetes. Ask your care team what you can do to lower your risk of diabetes while taking this medication. What side effects may I notice from receiving this medication? Side effects that you should report to your care team as soon as possible: Allergic reactions--skin rash, itching, hives, swelling of the face, lips, tongue, or throat Cushing syndrome--increased fat around the midsection, upper back, neck, or face, pink or purple stretch marks on the skin, thinning, fragile skin that easily bruises, unexpected hair growth High blood sugar (hyperglycemia)--increased thirst or amount of urine, unusual weakness or fatigue, blurry vision Increase in blood pressure Infection--fever, chills, cough, sore throat, wounds that don't heal, pain or trouble when passing urine, general feeling of discomfort or being unwell Low adrenal gland function--nausea, vomiting, loss of appetite, unusual weakness or fatigue, dizziness Mood and behavior changes--anxiety, nervousness, confusion, hallucinations, irritability, hostility, thoughts of suicide or self-harm, worsening mood, feelings of depression Stomach bleeding--bloody or black, tar-like stools, vomiting blood or brown material that looks like coffee grounds Swelling of the ankles, hands, or feet Side effects that usually do not require medical attention (report to your care team if they continue or are bothersome): Acne General discomfort and fatigue Headache Increase in appetite Nausea Trouble sleeping Weight gain This list may not describe all possible side effects. Call your doctor for medical advice about side effects. You may report side effects to FDA at 1-800-FDA-1088. Where should I keep my medication? This medication is given in a hospital or clinic. It will not be stored at home. NOTE: This sheet is a summary. It may  not cover all possible information. If you have questions about this medicine, talk to your doctor, pharmacist, or health care provider.  2024 Elsevier/Gold Standard (2021-07-09 00:00:00)Ondansetron  Injection What is this medication? ONDANSETRON  (on DAN se tron) prevents nausea and vomiting from chemotherapy, radiation, or surgery. It works by blocking substances in the body that may cause nausea or vomiting. It belongs to a class of medications called antiemetics. This medicine may be used for other purposes; ask your health care provider or pharmacist if you have questions. COMMON BRAND NAME(S): Zofran , Zofran  in Dextrose , Zofran  Solution What should I tell my care team before I take this medication? They need to know if you have any of these conditions: Heart disease History of irregular heartbeat Liver disease Low levels of magnesium or potassium in the blood An unusual or allergic reaction to ondansetron , granisetron, other medications, foods, dyes, or preservatives Pregnant or trying to get pregnant Breast-feeding How should I use this medication? This medication is injected into a vein. It is given by your care team in a hospital or clinic setting. Talk to your care team about the use of this medication in children. Special care may be needed. Overdosage: If you think you have taken too much of this medicine contact a poison control center or emergency room at once. NOTE: This medicine is only for you. Do not share this medicine with others. What if I miss a dose? This does not apply. What may interact with this medication? Do not take this medication with any of the following: Apomorphine Certain medications for fungal infections, such as fluconazole , itraconazole, ketoconazole, posaconazole, voriconazole Cisapride Dronedarone Pimozide Thioridazine This medication may also interact with the following: Carbamazepine Certain medications for depression, anxiety, or mental health  conditions Fentanyl  Linezolid MAOIs, such as  Carbex, Eldepryl, Marplan, Nardil, and Parnate Methylene blue (injected into a vein) Other medications that cause heart rhythm changes, such as dofetilide, ziprasidone Phenytoin  Rifampicin Tramadol This list may not describe all possible interactions. Give your health care provider a list of all the medicines, herbs, non-prescription drugs, or dietary supplements you use. Also tell them if you smoke, drink alcohol, or use illegal drugs. Some items may interact with your medicine. What should I watch for while using this medication? Your condition will be monitored carefully while you are receiving this medication. What side effects may I notice from receiving this medication? Side effects that you should report to your care team as soon as possible: Allergic reactions--skin rash, itching, hives, swelling of the face, lips, tongue, or throat Bowel blockage--stomach cramping, unable to have a bowel movement or pass gas, loss of appetite, vomiting Chest pain (angina)--pain, pressure, or tightness in the chest, neck, back, or arms Heart rhythm changes--fast or irregular heartbeat, dizziness, feeling faint or lightheaded, chest pain, trouble breathing Irritability, confusion, fast or irregular heartbeat, muscle stiffness, twitching muscles, sweating, high fever, seizure, chills, vomiting, diarrhea, which may be signs of serotonin syndrome Side effects that usually do not require medical attention (report to your care team if they continue or are bothersome): Constipation Diarrhea General discomfort and fatigue Headache This list may not describe all possible side effects. Call your doctor for medical advice about side effects. You may report side effects to FDA at 1-800-FDA-1088. Where should I keep my medication? This medication is given in a hospital or clinic and will not be stored at home. NOTE: This sheet is a summary. It may not cover all  possible information. If you have questions about this medicine, talk to your doctor, pharmacist, or health care provider.  2024 Elsevier/Gold Standard (2021-07-02 00:00:00)

## 2024-02-21 NOTE — Assessment & Plan Note (Addendum)
 Mild microcytic anemia.  She may have bleeding from her tumor.  Iron studies today reveal iron deficiency.  I will arrange for her to have IV iron when she is here for IV fluids next week.

## 2024-02-21 NOTE — Assessment & Plan Note (Signed)
 Occlusive deep venous thrombosis along the popliteal and peroneal vein diagnosed in March 2025. She was placed on apixaban  10 mg twice daily for 7 days, then apixaban  5 mg twice daily indefinitely.  Aspirin 81 mg daily was discontinued.  The patient continues apixaban  5 mg twice daily without difficulty.

## 2024-02-21 NOTE — Assessment & Plan Note (Signed)
 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 Stain for HER2 was negative.  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 PET scan revealed omental involvement. She received palliative chemotherapy with FOLFOX/nivolumab  (5-fluorouracil /leucovorin /oxaliplatin /nivolumab ). Oxaliplatin  was discontinued after 11 cycles, and she continued 5-fluorouracil /leucovorin /nivolumab .  She has had fairly good control of her disease.  She developed immune mediated nephritis in February 2024, which resolved with high-dose steroids and holding nivolumab .  She has had chronic kidney disease since that time.  She receives extra IV fluids weekly to prevent recurrent acute kidney injury.  Unfortunately, CT chest, abdomen and pelvis, as well as EGD in November 2025 revealed progression of disease.  Chemoimmunotherapy was discontinued.  We discussed options of further chemotherapy, but she prefers to go with comfort care after discussing the risks and benefits. We did discuss hospice care and what services they offer. We discussed code status and she wished DNR in the event of a cardiorespiratory arrest. We continue to provide provide supportive/palliative care.  She  continues IV fluids weekly.  She is doing fairly well, but has had some increased upper abdominal pain.  She knows to use her tramadol  as needed and if this is not effective to contact our office.  Her creatinine continues to fluctuate up and down.  She will receive IV fluids today.  I will plan to see her back in 2 weeks with a CBC and comprehensive metabolic panel.

## 2024-02-23 ENCOUNTER — Telehealth: Payer: Self-pay

## 2024-02-23 NOTE — Telephone Encounter (Signed)
 Pt called to report she woke up this morning with a sore throat. Her daughter told her to call us  and let us  know. She doesn't have fever. She has occasional productive cough - sputum clear to little yellow. No chest congestion. She doesn't take vaccines - so she hasn't had flu vaccine. She reports she had some company @ Christmas, and one of the little ones had a cough. I think I'm ok. I just had labs and they were ok. She is taking an allergy medication OTC. Please advise.

## 2024-02-24 ENCOUNTER — Encounter: Payer: Self-pay | Admitting: Oncology

## 2024-02-24 ENCOUNTER — Other Ambulatory Visit (HOSPITAL_BASED_OUTPATIENT_CLINIC_OR_DEPARTMENT_OTHER): Payer: Self-pay

## 2024-02-24 ENCOUNTER — Ambulatory Visit (HOSPITAL_BASED_OUTPATIENT_CLINIC_OR_DEPARTMENT_OTHER): Admit: 2024-02-24 | Discharge: 2024-02-24 | Disposition: A | Admitting: Radiology

## 2024-02-24 ENCOUNTER — Ambulatory Visit (HOSPITAL_BASED_OUTPATIENT_CLINIC_OR_DEPARTMENT_OTHER)
Admission: EM | Admit: 2024-02-24 | Discharge: 2024-02-24 | Disposition: A | Discharge status: Home / Self Care | Attending: Family Medicine | Admitting: Family Medicine

## 2024-02-24 ENCOUNTER — Other Ambulatory Visit: Payer: Self-pay | Admitting: Hematology and Oncology

## 2024-02-24 ENCOUNTER — Encounter (HOSPITAL_BASED_OUTPATIENT_CLINIC_OR_DEPARTMENT_OTHER): Payer: Self-pay

## 2024-02-24 DIAGNOSIS — R051 Acute cough: Secondary | ICD-10-CM

## 2024-02-24 DIAGNOSIS — R509 Fever, unspecified: Secondary | ICD-10-CM

## 2024-02-24 DIAGNOSIS — J111 Influenza due to unidentified influenza virus with other respiratory manifestations: Secondary | ICD-10-CM

## 2024-02-24 LAB — POC COVID19/FLU A&B COMBO
Covid Antigen, POC: NEGATIVE
Influenza A Antigen, POC: NEGATIVE
Influenza B Antigen, POC: NEGATIVE

## 2024-02-24 MED ORDER — OSELTAMIVIR PHOSPHATE 30 MG PO CAPS
30.0000 mg | ORAL_CAPSULE | Freq: Two times a day (BID) | ORAL | 0 refills | Status: AC
  Filled 2024-02-24: qty 10, 5d supply, fill #0

## 2024-02-24 MED ORDER — PROMETHAZINE-DM 6.25-15 MG/5ML PO SYRP
2.5000 mL | ORAL_SOLUTION | Freq: Four times a day (QID) | ORAL | 0 refills | Status: AC | PRN
  Filled 2024-02-24: qty 118, 12d supply, fill #0

## 2024-02-24 NOTE — Discharge Instructions (Addendum)
 Probable influenza with respiratory manifestations including cough and fever: Tamiflu  30 mg twice daily for 5 days (this is renal dosing adjustment).  Promethazine  DM, 2.5 mL every 6 hours as needed for cough.  Get plenty of fluids and rest.  Chest x-ray looks a little hazy but no consolidation.  Will update the patient and her family if the radiology results differ.  Follow-up with oncology/hematology next week as planned.  Go to an emergency room if significant worsening of symptoms occurs.  Follow-up here as needed.

## 2024-02-24 NOTE — ED Triage Notes (Signed)
 Pt states she started to have nasal congestion yesterday. Today she has started to have a fever-100, cough, chills, and scratchy throat starting today. She has not taken anything for her symptoms.

## 2024-02-24 NOTE — ED Provider Notes (Signed)
 " Mackenzie Lewis    CSN: [id number] Arrival date & time: 02/24/24  1013      History   Chief Complaint Chief Complaint  Patient presents with   Fever    HPI Mackenzie Lewis is a 89 y.o. female.   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 Stain for HER2 was negative.  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 PET scan revealed omental involvement. She received palliative chemotherapy with FOLFOX/nivolumab  (5-fluorouracil /leucovorin /oxaliplatin /nivolumab ). Oxaliplatin  was discontinued after 11 cycles, and she continued 5-fluorouracil /leucovorin /nivolumab .  She has had fairly good control of her disease.  She developed immune mediated nephritis in February 2024, which resolved with high-dose steroids and holding nivolumab .  She has had chronic kidney disease since that time.  She receives extra IV fluids weekly to prevent recurrent acute kidney injury.   Unfortunately, CT chest, abdomen and pelvis, as well as EGD in November 2025 revealed progression of disease.  Chemoimmunotherapy was discontinued.  The patient is on supportive therapy at this point in time.  She is getting lab work and IV fluids weekly and was last seen on 02/21/2024.  At that visit her hemoglobin was low and she will be getting an iron infusion next week.  She started with nasal congestion on 02/23/2024 and has low-grade fevers of 99-100 at home today with chills, cough, sore throat and bodyaches.   Fever Associated symptoms: congestion, cough, rhinorrhea and sore throat   Associated symptoms: no chest pain, no chills, no diarrhea, no dysuria, no ear pain, no nausea, no rash and no vomiting     Past Medical History:  Diagnosis Date   Abdominal pain 01/17/2023   Appendicitis with peritonitis  04/10/2016   Atypical chest pain 09/09/2016   Benign hypertensive renal disease 09/01/2016   Bilateral primary osteoarthritis of knee 01/20/2016   Borderline diabetes 09/09/2016   CKD (chronic kidney disease), stage II 09/01/2016   Cyclic citrullinated peptide (CCP) antibody positive 01/20/2016   Because she has positive CCP, I want to make sure we monitor the patient closely and we encouraged the patient to look for symptoms that include increased hand stiffness, swelling and redness to the MCP joint.  If that happens, she is to call us  so that we can schedule her for an ultrasound to look for synovitis.     Essential hypertension 09/09/2016   Gastric cancer (HCC) 09/10/2021   Hyperlipidemia 09/01/2016   Hypertension    Hypothyroidism 09/01/2016   Osteoarthritis of both feet 01/20/2016   Osteoarthritis, hand 01/20/2016   Thyroid  disease     Patient Active Problem List   Diagnosis Date Noted   Microcytic anemia 02/21/2024   Ventricular ectopy 12/14/2023   Left leg swelling 08/23/2023   Acute left ankle pain 08/23/2023   Diarrhea 08/10/2023   Acute deep vein thrombosis (DVT) of popliteal vein of right lower extremity (HCC) 07/26/2023   Left foot pain 07/26/2023   Tenderness of right calf 05/03/2023   Foul smelling urine 05/03/2023   Right knee pain 03/29/2023   Abdominal pain 01/17/2023   Shoulder pain 08/23/2022   Pain due to onychomycosis of toenails of both feet 08/06/2022   Idiopathic peripheral neuropathy 08/06/2022   Drug-induced interstitial nephritis 04/14/2022    Class: Acute   Dehydration 04/07/2022   Elevated serum creatinine 04/07/2022  Genetic testing 12/23/2021   Monoallelic mutation of CHEK2 gene in female patient 12/23/2021   Hypokalemia 12/03/2021    Class: Diagnosis of   Gastric cancer (HCC) 09/10/2021    Class: Acute   Abdominal distension (gaseous) 04/27/2021   Abnormal weight gain 04/27/2021   Change in bowel habit 04/27/2021   Constipation  04/27/2021   Epigastric pain 04/27/2021   Flatulence, eructation and gas pain 04/27/2021   Gastro-esophageal reflux disease without esophagitis 04/27/2021   Morbid (severe) obesity due to excess calories (HCC) 04/27/2021   Rectal bleeding 04/27/2021   Thyroid  disease    Hypertension    Atypical chest pain 09/09/2016   Essential hypertension 09/09/2016   Borderline diabetes 09/09/2016   Benign hypertensive renal disease 09/01/2016   Hypothyroidism 09/01/2016   Hyperlipidemia 09/01/2016   CKD (chronic kidney disease), stage II 09/01/2016   Appendicitis with peritonitis 04/10/2016   Bilateral primary osteoarthritis of knee 01/20/2016   Osteoarthritis of both feet 01/20/2016   Osteoarthritis, hand 01/20/2016   Cyclic citrullinated peptide (CCP) antibody positive 01/20/2016    Past Surgical History:  Procedure Laterality Date   APPENDECTOMY     LAPAROSCOPIC APPENDECTOMY N/A 04/10/2016   Procedure: APPENDECTOMY LAPAROSCOPIC;  Surgeon: Vicenta Poli, MD;  Location: [facility] OR;  Service: General;  Laterality: N/A;    OB History   No obstetric history on file.      Home Medications    Prior to Admission medications  Medication Sig Start Date End Date Taking? Authorizing Provider  oseltamivir  (TAMIFLU ) 30 MG capsule Take 1 capsule (30 mg total) by mouth 2 (two) times daily for 5 days. 02/24/24 02/29/24 Yes Ival Domino, FNP  promethazine -dextromethorphan (PROMETHAZINE -DM) 6.25-15 MG/5ML syrup Take 2.5 mLs by mouth every 6 (six) hours as needed for cough. Do not use and drive - May make drowsy. 02/24/24  Yes Ival Domino, FNP  ELIQUIS  5 MG TABS tablet TAKE 1 TABLET TWICE A DAY 10/07/23   Mosher, Kelli A, PA-C  famotidine (PEPCID) 40 MG tablet Take 40 mg by mouth at bedtime. 09/01/21   [provider]  folic acid (FOLVITE) 1 MG tablet Take 1 mg by mouth daily.    [provider]  Lactobacillus TABS Take 1 tablet by mouth 2 (two) times daily. 02/05/22   [provider]  levothyroxine  (SYNTHROID , LEVOTHROID) 75 MCG tablet Take 75 mcg by mouth daily before breakfast.     [provider]  loratadine (CLARITIN) 10 MG tablet Take 10 mg by mouth daily.    [provider]  LORazepam  (ATIVAN ) 1 MG tablet Take 1 tablet (1 mg total) by mouth every 8 (eight) hours. 02/02/24   Harl Setter A, NP  metoprolol  succinate (TOPROL  XL) 50 MG 24 hr tablet Take 1 tablet (50 mg total) by mouth daily. 01/27/24   Krasowski, Robert J, MD  NON FORMULARY Take 1 Dose by mouth See admin instructions. MMW: 3 parts Maalox 2 parts Benadryl  1 part viscious lidicaine  Disp. 6oz  Instructions: 5ml swish and swallow every 3-4 hours    Cornelius Wanda DEL, MD  omeprazole  (PRILOSEC) 40 MG capsule Take 1 capsule (40 mg total) by mouth 2 (two) times daily. 12/31/21   Cornelius Wanda DEL, MD  ondansetron  (ZOFRAN -ODT) 4 MG disintegrating tablet Take 1 tablet (4 mg total) by mouth every 8 (eight) hours as needed for nausea or vomiting. 09/13/23   Cornelius Wanda DEL, MD  polyethylene glycol powder (GLYCOLAX /MIRALAX ) 17 GM/SCOOP powder Take 1 Container by mouth daily. 10/12/21  [provider]  potassium chloride  SA (KLOR-CON  M) 20 MEQ tablet TAKE 1 TABLET TWICE A DAY Patient taking differently: Take 20 mEq by mouth daily. 04/19/23   Cornelius Wanda DEL, MD  Probiotic Product (PROBIOTIC DAILY PO) Take 1 tablet by mouth daily.    [provider]  prochlorperazine  (COMPAZINE ) 10 MG tablet Take 1 tablet (10 mg total) by mouth every 6 (six) hours as needed for nausea or vomiting. 11/15/23   Cornelius Wanda DEL, MD  sucralfate  (CARAFATE ) 1 g tablet Take 1 tablet (1 g total) by mouth 4 (four) times daily -  with meals and at bedtime. 01/17/24   Cornelius Wanda DEL, MD  traMADol  (ULTRAM ) 50 MG tablet Take 1 tablet (50 mg total) by mouth every 6 (six) hours as needed. 01/17/24   Parsons, Melissa A, NP  valsartan (DIOVAN) 160 MG tablet Take 160 mg by mouth daily.  04/05/23   [provider]    Family History Family History  Problem Relation Age of Onset   Hypertension Mother    Prostate cancer Father        metastatic; d. 15   Brain cancer Sister 60   Breast cancer Sister 71   AAA (abdominal aortic aneurysm) Brother    Leukemia Cousin        x2 maternal female cousins; d. before 30   Breast cancer Daughter 61       DCIS    Social History Social History[1]   Allergies   Doxycycline and Sulfa antibiotics   Review of Systems Review of Systems  Constitutional:  Positive for fever. Negative for chills.  HENT:  Positive for congestion, postnasal drip, rhinorrhea and sore throat. Negative for ear pain.   Eyes:  Negative for pain and visual disturbance.  Respiratory:  Positive for cough. Negative for shortness of breath.   Cardiovascular:  Negative for chest pain and palpitations.  Gastrointestinal:  Negative for abdominal pain, constipation, diarrhea, nausea and vomiting.  Genitourinary:  Negative for dysuria and hematuria.  Musculoskeletal:  Positive for arthralgias. Negative for back pain.  Skin:  Negative for color change and rash.  Neurological:  Negative for seizures and syncope.  All other systems reviewed and are negative.    Physical Exam Triage Vital Signs ED Triage Vitals  Encounter Vitals Group     BP 02/24/24 1130 121/78     Girls Systolic BP Percentile --      Girls Diastolic BP Percentile --      Boys Systolic BP Percentile --      Boys Diastolic BP Percentile --      Pulse Rate 02/24/24 1130 87     Resp 02/24/24 1130 20     Temp 02/24/24 1130 98.4 F (36.9 C)     Temp Source 02/24/24 1130 Oral     SpO2 02/24/24 1130 97 %     Weight --      Height --      Head Circumference --      Peak Flow --      Pain Score 02/24/24 1128 0     Pain Loc --      Pain Education --      Exclude from Growth Chart --    No data found.  Updated Vital Signs BP 121/78 (BP Location: Right Arm)   Pulse 87   Temp 98.4  F (36.9 C) (Oral)   Resp 20   SpO2 97%   Visual Acuity Right Eye Distance:   Left  Eye Distance:   Bilateral Distance:    Right Eye Near:   Left Eye Near:    Bilateral Near:     Physical Exam Vitals and nursing note reviewed.  Constitutional:      General: She is not in acute distress.    Appearance: She is well-developed. She is ill-appearing. She is not toxic-appearing or diaphoretic.  HENT:     Head: Normocephalic and atraumatic.     Right Ear: Hearing, tympanic membrane, ear canal and external ear normal.     Left Ear: Hearing, tympanic membrane, ear canal and external ear normal.     Nose: Congestion and rhinorrhea present. Rhinorrhea is clear.     Right Sinus: No maxillary sinus tenderness or frontal sinus tenderness.     Left Sinus: No maxillary sinus tenderness or frontal sinus tenderness.     Mouth/Throat:     Lips: Pink.     Mouth: Mucous membranes are moist.     Pharynx: Uvula midline. No oropharyngeal exudate or posterior oropharyngeal erythema.     Tonsils: No tonsillar exudate.  Eyes:     Conjunctiva/sclera: Conjunctivae normal.     Pupils: Pupils are equal, round, and reactive to light.  Cardiovascular:     Rate and Rhythm: Normal rate and regular rhythm.     Heart sounds: S1 normal and S2 normal. No murmur heard. Pulmonary:     Effort: Pulmonary effort is normal. No respiratory distress.     Breath sounds: Examination of the right-upper field reveals rhonchi. Examination of the left-upper field reveals rhonchi. Examination of the right-middle field reveals rhonchi. Examination of the left-middle field reveals rhonchi. Examination of the right-lower field reveals rhonchi. Examination of the left-lower field reveals rhonchi. Rhonchi (Mild, intermittent rhonchi throughout.) present. No decreased breath sounds, wheezing or rales.     Comments: Oxygen saturation is 97% on room air.  Patient has rhonchi throughout. Abdominal:     General: Bowel sounds are normal.      Palpations: Abdomen is soft.     Tenderness: There is no abdominal tenderness.  Musculoskeletal:        General: No swelling.     Cervical back: Neck supple.  Lymphadenopathy:     Head:     Right side of head: No submental, submandibular, tonsillar, preauricular or posterior auricular adenopathy.     Left side of head: No submental, submandibular, tonsillar, preauricular or posterior auricular adenopathy.     Cervical: No cervical adenopathy.     Right cervical: No superficial cervical adenopathy.    Left cervical: No superficial cervical adenopathy.  Skin:    General: Skin is warm and dry.     Capillary Refill: Capillary refill takes less than 2 seconds.     Findings: No rash.  Neurological:     Mental Status: She is alert and oriented to person, place, and time.  Psychiatric:        Mood and Affect: Mood normal.      UC Treatments / Results  Labs (all labs ordered are listed, but only abnormal results are displayed) Labs Reviewed  POC COVID19/FLU A&B COMBO - Normal    Creatinine clearance, original Cockcroft-Gault: 45 mL/min Creatinine clearance modified for overweight patient, using adjusted body weight of 70 kg (155 lbs): 34 mL/min Note: This range uses IBW and adjusted body weight. Controversy exists over which form of weight to use: 26.4-34.0 mL/min  EKG   Radiology No results found.  Procedures Procedures (including critical Lewis time)  Medications  Ordered in UC Medications - No data to display  Initial Impression / Assessment and Plan / UC Course  I have reviewed the triage vital signs and the nursing notes.  Pertinent labs & imaging results that were available during my Lewis of the patient were reviewed by me and considered in my medical decision making (see chart for details).  Plan of Lewis (see discharge instructions for additional patient precautions and education): Probable influenza with respiratory manifestations including cough and fever:  Tamiflu  30 mg twice daily for 5 days (this is renal dosing adjustment).  Promethazine  DM, 2.5 mL every 6 hours as needed for cough.  Get plenty of fluids and rest.  Chest x-ray looks a little hazy but no consolidation.  Will update the patient and her family if the radiology results differ.  I spoke to Burnard Foy, PA-C, and reviewed the patient's current symptoms and recent medical history versus her visit on 02/21/2024 at oncology.  Burnard agreed with the current plan of Lewis for discharge from urgent Lewis.  Follow-up with oncology/hematology next week as planned.  Go to an emergency room if significant worsening of symptoms occurs.  Follow-up here as needed.  I reviewed the plan of Lewis with the patient and/or the patient's guardian.  The patient and/or guardian had time to ask questions and acknowledged that the questions were answered.  Final Clinical Impressions(s) / UC Diagnoses   Final diagnoses:  Acute cough  Fever, unspecified  Influenza with respiratory manifestation     Discharge Instructions      Probable influenza with respiratory manifestations including cough and fever: Tamiflu  30 mg twice daily for 5 days (this is renal dosing adjustment).  Promethazine  DM, 2.5 mL every 6 hours as needed for cough.  Get plenty of fluids and rest.  Chest x-ray looks a little hazy but no consolidation.  Will update the patient and her family if the radiology results differ.  Follow-up with oncology/hematology next week as planned.  Go to an emergency room if significant worsening of symptoms occurs.  Follow-up here as needed.     ED Prescriptions     Medication Sig Dispense Auth. Provider   oseltamivir  (TAMIFLU ) 30 MG capsule Take 1 capsule (30 mg total) by mouth 2 (two) times daily for 5 days. 10 capsule Ival Domino, FNP   promethazine -dextromethorphan (PROMETHAZINE -DM) 6.25-15 MG/5ML syrup Take 2.5 mLs by mouth every 6 (six) hours as needed for cough. Do not use and drive - May make  drowsy. 118 mL Ival Domino, FNP      PDMP not reviewed this encounter.    [1]  Social History Tobacco Use   Smoking status: Never   Smokeless tobacco: Never  Vaping Use   Vaping status: Never Used  Substance Use Topics   Alcohol  use: Not Currently    Comment: occasionally/ 1 time per month   Drug use: Never     Ival Domino, FNP 02/24/24 1401  "

## 2024-02-25 ENCOUNTER — Ambulatory Visit (HOSPITAL_BASED_OUTPATIENT_CLINIC_OR_DEPARTMENT_OTHER): Payer: Self-pay | Admitting: Family Medicine

## 2024-02-25 NOTE — Progress Notes (Signed)
 X-ray is improved from previous x-rays and is negative for pneumonia.  Patient was given this impression during the visit but I also called and left her a voicemail message today to give her the radiology report.  She is to follow-up with oncology next week and could also get the results then.

## 2024-02-28 ENCOUNTER — Inpatient Hospital Stay

## 2024-02-28 ENCOUNTER — Other Ambulatory Visit: Payer: Self-pay | Admitting: Hematology and Oncology

## 2024-02-28 VITALS — BP 122/70 | HR 81 | Temp 99.3°F | Resp 22

## 2024-02-28 DIAGNOSIS — C169 Malignant neoplasm of stomach, unspecified: Secondary | ICD-10-CM | POA: Diagnosis not present

## 2024-02-28 DIAGNOSIS — R7989 Other specified abnormal findings of blood chemistry: Secondary | ICD-10-CM

## 2024-02-28 DIAGNOSIS — E860 Dehydration: Secondary | ICD-10-CM

## 2024-02-28 DIAGNOSIS — C168 Malignant neoplasm of overlapping sites of stomach: Secondary | ICD-10-CM

## 2024-02-28 DIAGNOSIS — E876 Hypokalemia: Secondary | ICD-10-CM

## 2024-02-28 LAB — CBC WITH DIFFERENTIAL (CANCER CENTER ONLY)
Abs Immature Granulocytes: 0.06 K/uL (ref 0.00–0.07)
Basophils Absolute: 0.1 K/uL (ref 0.0–0.1)
Basophils Relative: 0 %
Eosinophils Absolute: 0.3 K/uL (ref 0.0–0.5)
Eosinophils Relative: 2 %
HCT: 35.9 % — ABNORMAL LOW (ref 36.0–46.0)
Hemoglobin: 11.5 g/dL — ABNORMAL LOW (ref 12.0–15.0)
Immature Granulocytes: 1 %
Lymphocytes Relative: 20 %
Lymphs Abs: 2.7 K/uL (ref 0.7–4.0)
MCH: 25.1 pg — ABNORMAL LOW (ref 26.0–34.0)
MCHC: 32 g/dL (ref 30.0–36.0)
MCV: 78.4 fL — ABNORMAL LOW (ref 80.0–100.0)
Monocytes Absolute: 1 K/uL (ref 0.1–1.0)
Monocytes Relative: 8 %
Neutro Abs: 9.2 K/uL — ABNORMAL HIGH (ref 1.7–7.7)
Neutrophils Relative %: 69 %
Platelet Count: 297 K/uL (ref 150–400)
RBC: 4.58 MIL/uL (ref 3.87–5.11)
RDW: 15.1 % (ref 11.5–15.5)
WBC Count: 13.2 K/uL — ABNORMAL HIGH (ref 4.0–10.5)
nRBC: 0 % (ref 0.0–0.2)

## 2024-02-28 LAB — CMP (CANCER CENTER ONLY)
ALT: 17 U/L (ref 0–44)
AST: 20 U/L (ref 15–41)
Albumin: 4 g/dL (ref 3.5–5.0)
Alkaline Phosphatase: 92 U/L (ref 38–126)
Anion gap: 14 (ref 5–15)
BUN: 21 mg/dL (ref 8–23)
CO2: 22 mmol/L (ref 22–32)
Calcium: 9.3 mg/dL (ref 8.9–10.3)
Chloride: 99 mmol/L (ref 98–111)
Creatinine: 1.33 mg/dL — ABNORMAL HIGH (ref 0.44–1.00)
GFR, Estimated: 39 mL/min — ABNORMAL LOW
Glucose, Bld: 120 mg/dL — ABNORMAL HIGH (ref 70–99)
Potassium: 3.4 mmol/L — ABNORMAL LOW (ref 3.5–5.1)
Sodium: 135 mmol/L (ref 135–145)
Total Bilirubin: 0.4 mg/dL (ref 0.0–1.2)
Total Protein: 6.5 g/dL (ref 6.5–8.1)

## 2024-02-28 MED ORDER — DEXAMETHASONE SOD PHOSPHATE PF 10 MG/ML IJ SOLN
10.0000 mg | Freq: Once | INTRAMUSCULAR | Status: AC
  Administered 2024-02-28: 10 mg via INTRAVENOUS
  Filled 2024-02-28: qty 1

## 2024-02-28 MED ORDER — ONDANSETRON HCL 4 MG/2ML IJ SOLN
8.0000 mg | Freq: Once | INTRAMUSCULAR | Status: AC
  Administered 2024-02-28: 8 mg via INTRAVENOUS
  Filled 2024-02-28: qty 4

## 2024-02-28 MED ORDER — SODIUM CHLORIDE 0.9 % IV SOLN: INTRAVENOUS | Status: DC

## 2024-02-28 NOTE — Patient Instructions (Signed)
 Dehydration, Adult Dehydration is a condition in which there is not enough water or other fluids in the body. This happens when a person loses more fluids than they take in. Important organs cannot work right without the right amount of fluids. Any loss of fluids from the body can cause dehydration. Dehydration can be mild, worse, or very bad. It should be treated right away to keep it from getting very bad. What are the causes? Conditions that cause loss of water in the body. They include: Watery poop (diarrhea). Vomiting. Sweating a lot. Fever. Infection. Peeing (urinating) a lot. Not drinking enough fluids. Certain medicines, such as medicines that take extra fluid out of the body (diuretics). Lack of safe drinking water. Not being able to get enough water and food. What increases the risk? Having a long-term (chronic) illness that has not been treated the right way, such as: Diabetes. Heart disease. Kidney disease. Being 25 years of age or older. Having a disability. Living in a place that is high above the ground or sea (high in altitude). The thinner, drier air causes more fluid loss. Doing exercises that put stress on your body for a long time. Being active when in hot places. What are the signs or symptoms? Symptoms of dehydration depend on how bad it is. Mild or worse dehydration Thirst. Dry lips or dry mouth. Feeling dizzy or light-headed. Muscle cramps. Passing little pee or dark pee. Pee may be the color of tea. Headache. Very bad dehydration Changes in skin. Skin may: Be cold to the touch (clammy). Be blotchy or pale. Not go back to normal right after you pinch it and let it go. Little or no tears, pee, or sweat. Fast breathing. Low blood pressure. Weak pulse. Pulse that is more than 100 beats a minute when you are sitting still. Other changes, such as: Feeling very thirsty. Eyes that look hollow (sunken). Cold hands and feet. Being confused. Being very  tired (lethargic) or having trouble waking from sleep. Losing weight. Loss of consciousness. How is this treated? Treatment for this condition depends on how bad your dehydration is. Treatment should start right away. Do not wait until your condition gets very bad. Very bad dehydration is an emergency. You will need to go to a hospital. Mild or worse dehydration can be treated at home. You may be asked to: Drink more fluids. Drink an oral rehydration solution (ORS). This drink gives you the right amount of fluids, salts, and minerals (electrolytes). Very bad dehydration can be treated: With fluids through an IV tube. By correcting low levels of electrolytes in the body. By treating the problem that caused your dehydration. Follow these instructions at home: Oral rehydration solution If told by your doctor, drink an ORS: Make an ORS. Use instructions on the package. Start by drinking small amounts, about  cup (120 mL) every 5-10 minutes. Slowly drink more until you have had the amount that your doctor said to have.  Eating and drinking  Drink enough clear fluid to keep your pee pale yellow. If you were told to drink an ORS, finish the ORS first. Then, start slowly drinking other clear fluids. Drink fluids such as: Water. Do not drink only water. Doing that can make the salt (sodium) level in your body get too low. Water from ice chips you suck on. Fruit juice that you have added water to (diluted). Low-calorie sports drinks. Eat foods that have the right amounts of salts and minerals, such as bananas, oranges, potatoes,  tomatoes, or spinach. Do not drink alcohol. Avoid drinks that have caffeine or sugar. These include:: High-calorie sports drinks. Fruit juice that you did not add water to. Soda. Coffee or energy drinks. Avoid foods that are greasy or have a lot of fat or sugar. General instructions Take over-the-counter and prescription medicines only as told by your doctor. Do  not take sodium tablets. Doing that can make the salt level in your body get too high. Return to your normal activities as told by your doctor. Ask your doctor what activities are safe for you. Keep all follow-up visits. Your doctor may check and change your treatment. Contact a doctor if: You have pain in your belly (abdomen) and the pain: Gets worse. Stays in one place. You have a rash. You have a stiff neck. You get angry or annoyed more easily than normal. You are more tired or have a harder time waking than normal. You feel weak or dizzy. You feel very thirsty. Get help right away if: You have any symptoms of very bad dehydration. You vomit every time you eat or drink. Your vomiting gets worse, does not go away, or you vomit blood or green stuff. You are getting treatment, but symptoms are getting worse. You have a fever. You have a very bad headache. You have: Diarrhea that gets worse or does not go away. Blood in your poop (stool). This may cause poop to look black and tarry. No pee in 6-8 hours. Only a small amount of pee in 6-8 hours, and the pee is very dark. You have trouble breathing. These symptoms may be an emergency. Get help right away. Call 911. Do not wait to see if the symptoms will go away. Do not drive yourself to the hospital. This information is not intended to replace advice given to you by your health care provider. Make sure you discuss any questions you have with your health care provider. Document Revised: 08/31/2021 Document Reviewed: 08/31/2021 Elsevier Patient Education  2024 [employer].

## 2024-02-29 ENCOUNTER — Other Ambulatory Visit: Payer: Self-pay | Admitting: Oncology

## 2024-02-29 DIAGNOSIS — C168 Malignant neoplasm of overlapping sites of stomach: Secondary | ICD-10-CM

## 2024-03-07 ENCOUNTER — Inpatient Hospital Stay

## 2024-03-07 ENCOUNTER — Encounter: Payer: Self-pay | Admitting: Oncology

## 2024-03-07 ENCOUNTER — Other Ambulatory Visit: Payer: Self-pay | Admitting: Oncology

## 2024-03-07 ENCOUNTER — Inpatient Hospital Stay: Admitting: Oncology

## 2024-03-07 VITALS — BP 115/57 | HR 98 | Resp 18

## 2024-03-07 VITALS — BP 123/63 | HR 104 | Temp 98.1°F | Resp 18 | Ht 64.0 in | Wt 197.4 lb

## 2024-03-07 DIAGNOSIS — R7989 Other specified abnormal findings of blood chemistry: Secondary | ICD-10-CM

## 2024-03-07 DIAGNOSIS — E876 Hypokalemia: Secondary | ICD-10-CM

## 2024-03-07 DIAGNOSIS — C168 Malignant neoplasm of overlapping sites of stomach: Secondary | ICD-10-CM | POA: Diagnosis not present

## 2024-03-07 DIAGNOSIS — C169 Malignant neoplasm of stomach, unspecified: Secondary | ICD-10-CM | POA: Diagnosis not present

## 2024-03-07 DIAGNOSIS — E860 Dehydration: Secondary | ICD-10-CM

## 2024-03-07 LAB — CMP (CANCER CENTER ONLY)
ALT: 13 U/L (ref 0–44)
AST: 19 U/L (ref 15–41)
Albumin: 3.8 g/dL (ref 3.5–5.0)
Alkaline Phosphatase: 82 U/L (ref 38–126)
Anion gap: 13 (ref 5–15)
BUN: 20 mg/dL (ref 8–23)
CO2: 22 mmol/L (ref 22–32)
Calcium: 9.5 mg/dL (ref 8.9–10.3)
Chloride: 100 mmol/L (ref 98–111)
Creatinine: 1.29 mg/dL — ABNORMAL HIGH (ref 0.44–1.00)
GFR, Estimated: 40 mL/min — ABNORMAL LOW
Glucose, Bld: 146 mg/dL — ABNORMAL HIGH (ref 70–99)
Potassium: 3.1 mmol/L — ABNORMAL LOW (ref 3.5–5.1)
Sodium: 135 mmol/L (ref 135–145)
Total Bilirubin: 0.4 mg/dL (ref 0.0–1.2)
Total Protein: 6.3 g/dL — ABNORMAL LOW (ref 6.5–8.1)

## 2024-03-07 LAB — CBC WITH DIFFERENTIAL (CANCER CENTER ONLY)
Abs Immature Granulocytes: 0.1 K/uL — ABNORMAL HIGH (ref 0.00–0.07)
Basophils Absolute: 0.1 K/uL (ref 0.0–0.1)
Basophils Relative: 0 %
Eosinophils Absolute: 0.3 K/uL (ref 0.0–0.5)
Eosinophils Relative: 2 %
HCT: 34.8 % — ABNORMAL LOW (ref 36.0–46.0)
Hemoglobin: 11.6 g/dL — ABNORMAL LOW (ref 12.0–15.0)
Immature Granulocytes: 1 %
Lymphocytes Relative: 13 %
Lymphs Abs: 2 K/uL (ref 0.7–4.0)
MCH: 25.3 pg — ABNORMAL LOW (ref 26.0–34.0)
MCHC: 33.3 g/dL (ref 30.0–36.0)
MCV: 75.8 fL — ABNORMAL LOW (ref 80.0–100.0)
Monocytes Absolute: 1.2 K/uL — ABNORMAL HIGH (ref 0.1–1.0)
Monocytes Relative: 8 %
Neutro Abs: 12.1 K/uL — ABNORMAL HIGH (ref 1.7–7.7)
Neutrophils Relative %: 76 %
Platelet Count: 320 K/uL (ref 150–400)
RBC: 4.59 MIL/uL (ref 3.87–5.11)
RDW: 15.2 % (ref 11.5–15.5)
WBC Count: 15.8 K/uL — ABNORMAL HIGH (ref 4.0–10.5)
nRBC: 0 % (ref 0.0–0.2)

## 2024-03-07 MED ORDER — LORATADINE 10 MG PO TABS: 10.0000 mg | ORAL_TABLET | Freq: Once | ORAL | Status: DC

## 2024-03-07 MED ORDER — SODIUM CHLORIDE 0.9 % IV SOLN: Freq: Once | INTRAVENOUS | Status: AC

## 2024-03-07 MED ORDER — DEXAMETHASONE 4 MG PO TABS: 4.0000 mg | ORAL_TABLET | Freq: Every day | ORAL | 5 refills | Status: AC

## 2024-03-07 MED ORDER — ACETAMINOPHEN 325 MG PO TABS
650.0000 mg | ORAL_TABLET | Freq: Once | ORAL | Status: AC
  Administered 2024-03-07: 650 mg via ORAL
  Filled 2024-03-07: qty 2

## 2024-03-07 MED ORDER — DEXAMETHASONE SOD PHOSPHATE PF 10 MG/ML IJ SOLN
8.0000 mg | Freq: Once | INTRAMUSCULAR | Status: AC
  Administered 2024-03-07: 8 mg via INTRAVENOUS
  Filled 2024-03-07: qty 1

## 2024-03-07 MED ORDER — SODIUM CHLORIDE 0.9 % IV SOLN: INTRAVENOUS | Status: DC

## 2024-03-07 MED ORDER — SODIUM CHLORIDE 0.9 % IV SOLN
1000.0000 mg | Freq: Once | INTRAVENOUS | Status: AC
  Administered 2024-03-07: 1000 mg via INTRAVENOUS
  Filled 2024-03-07: qty 1000

## 2024-03-07 MED ORDER — POTASSIUM CHLORIDE 10 MEQ/100ML IV SOLN
10.0000 meq | INTRAVENOUS | Status: AC
  Administered 2024-03-07 (×2): 10 meq via INTRAVENOUS
  Filled 2024-03-07 (×2): qty 100

## 2024-03-07 NOTE — Patient Instructions (Signed)

## 2024-03-07 NOTE — Progress Notes (Signed)
 " [facility]  [address] 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  Stain for HER2 was negative.  PET scan revealed omental involvement. She received palliative chemotherapy with FOLFOX/nivolumab  (5-fluorouracil /leucovorin /oxaliplatin /nivolumab ). Oxaliplatin  was discontinued after 11 cycles, and she continues 5-fluorouracil /leucovorin /nivolumab .  PET in February 2024 revealed resolution of metabolic activity associated with the stomach.  Decrease in size of omental nodularity with no associated metabolic activity.   EGD in March revealed residual disease. CT imaging every few months has remained stable. She has had chronic kidney disease since developing immune mediated nephritis in February 2024, which resolved with high-dose steroids and holding nivolumab .  She receives extra IV fluids weekly to prevent recurrent acute kidney injury. 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 Her disease remains stable for the last 2 years with palliative chemotherapy, but now has progressed. CT scan and EGD of November 2025 confirm progression of disease and we have stopped her current chemotherapy regimen. We discussed options of further chemotherapy, but she prefers to go with comfort care after discussing the risks and benefits. We did discuss hospice care and what services they offer. We discussed code status and she wishes DNR in the event of a cardiorespiratory arrest. We will provide palliative care.   Drug-induced interstitial nephritis History immune mediated nephritis from immunotherapy in February, which resolved with prednisone .  She has had chronic kidney disease since that episode.  She will continue to  get weekly IV fluids along with antiemetics and steroids. She knows to push oral fluids.  Her creatinine increased to 1.33 today.    Acute deep vein thrombosis (DVT) of popliteal vein of right lower extremity (HCC) Occlusive deep venous thrombosis along the popliteal and peroneal vein diagnosed in March 2025. She was placed on apixaban  10 mg twice daily for 7 days, then apixaban  5 mg twice daily indefinitely.  Aspirin 81 mg daily was discontinued.  The patient states she continues apixaban  5 mg twice daily without difficulty.   Cardiac Arrhthymias EKG shows occasional PVC's but she was in trigeminy when I listened during her exam. She is essentially asymptomatic from this but we will run it by Dr. Bernie. She continues to have PVC's fairly often but is being monitored by Dr. Krasowski and he has prescribed metoprolol  50 mg once daily.    Microcytic anemia Mild microcytic anemia.  She may have bleeding from her tumor.  Iron studies today reveal iron deficiency.  I will arrange for her to have IV iron when she is here for IV fluids next week.  Hypokalemia She will increase her oral potassium back up to BID but I will also give her 20 meq of IV potassium.    Plan: I gave her a prescription for a wheelchair today. She continues carafate  about twice daily and I informed her that she can take this about 4 times a day accompanied with her nausea medication throughout the day. I will also prescribe dexamethasone  4 mg. She inquired if at some point will she not be able to eat food and I informed her that there may be a time when she will only be on a liquid diet and that a feeding tube would not be beneficial with her extensive gastric involvement, similar to  a linitis plastica. I doubt it would even be feasible to place a feeding tube. We discussed Hospice and I explained if she was to decide to go into Hospice, then she cannot receive IV fluids, tests, and scans from me, but I would continue to be her  attending physician and could see her as needed. She has decided to think about this for now. She has an elevated WBC of 15.8, low but stable hemoglobin of 11.6, and platelet count of 320,000. Her CMP is fairly normal other than an elevated glucose of 146, creatinine of 1.29, and low total protein of 6.3, and potassium of 3.1. She currently takes one oral potassium supplement and I instructed her to increase this to BID. She was found to be iron deficient on 02/21/2024 and was prescribed oral iron supplement which she has not started yet. I will order IV iron and 20 meq of IV potassium for her to receive today. She will get IV fluids in 1 week. Then she will start weekly follow-ups and labs in February after in view of her declining condition. The patient understands the plans discussed today and is in agreement with them.  She knows to contact our office if she develops concerns prior to her next appointment.   I provided 44 minutes of face-to-face time during this encounter and > 50% was spent counseling as documented under my assessment and plan.   [name], MD  [facility] - A DEPT OF [facility] [address] [place] [address] Dept: [phone] Dept Fax: [phone]   No orders of the defined types were placed in this encounter.   CHIEF COMPLAINT:  CC: Stage IVB gastric cancer  Current Treatment: Supportive/palliative care  HISTORY OF PRESENT ILLNESS:  Mackenzie Lewis is a 89 y.o. female with stage IVB (T4b, N0, M1) gastric cancer diagnosed in July 2023.  She was referred by Dr. Velinda Fanti for assessment and management.  She had noticed that she was having regurgitation when eating and had lost over 30 pounds.  An ultrasound was done, revealing hepatic steatosis, and led to an MRI scan in June, which revealed gastric wall thickening with confluent nodularity of the omentum anterior to the stomach measuring 3.5 cm  consistent with metastatic tumor.  She also had low-grade edema and wall thickening extending into the duodenum from the stomach.  She was referred to Dr. Velinda Fanti and he did an EGD in July.  This revealed a large ulceration measuring 1.2 cm along the greater curvature.  She also had diffusely edematous and erythematous wall with erosions of the antrum and stiff and friable mucosa with oozing of blood.  These findings extended to the gastric fundus as well.  Pathology revealed a poorly differentiated adenocarcinoma with signet ring features from the biopsies of the ulcer as well as the antrum and the fundus of the stomach.  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  She was placed on omeprazole .  Her test for H. pylori was negative.  She was referred to Dr. Charlie Sar for consideration of surgery, but he felt this was not resectable because of the extensive involvement.  We consider this extending to the duodenum and metastatic to the omentum.  PET scan confirmed these findings.  She wished to pursue systemic intravenous therapy.  CEA and CA 19-9 were normal. She had genetic testing and is CHEK2 positive. Testing for HER2  of her tumor was negative. Guardant testing did not reveal any clinically significant mutations.   Oncology History  Gastric cancer (HCC)  09/10/2021 Initial Diagnosis   Gastric cancer (HCC)   09/10/2021 Cancer Staging   Staging form: Stomach, AJCC 8th Edition - Clinical stage from 09/10/2021: Stage IVB (cT4b, cN0, cM1) - Signed by Cornelius Wanda DEL, MD on 09/10/2021 Histopathologic type: Adenocarcinoma, NOS Stage prefix: Initial diagnosis Total positive nodes: 0 Histologic grade (G): G3 Histologic grading system: 3 grade system Sites of metastasis: Peritoneal surface Diagnostic confirmation: Positive histology PLUS positive immunophenotyping and/or positive genetic studies Specimen type: Endoscopy with Biopsy Staged by:  Managing physician Carcinoembryonic antigen (CEA) (ng/mL): 2.8 Carbohydrate antigen 19-9 (CA 19-9) (U/mL): 4.9 HER2 status: Unknown Microsatellite instability (MSI): Unknown Tumor location in stomach: Other Clinical staging modalities: Biopsy, Endoscopy Stage used in treatment planning: Yes National guidelines used in treatment planning: Yes Type of national guideline used in treatment planning: NCCN   09/28/2021 - 10/14/2021 Chemotherapy   Patient is on Treatment Plan : GASTROESOPHAGEAL FOLFOX + Nivolumab  q14d     09/28/2021 - 12/29/2023 Chemotherapy   Patient is on Treatment Plan : GASTROESOPHAGEAL FOLFOX + Nivolumab  q21d (changed from q14d on 04/12/23)     12/09/2021 Genetic Testing   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  Report date is 12/09/2021.   The Multi-Cancer + RNA Panel offered by [facility] includes sequencing and/or deletion/duplication analysis of the following 84 genes:  AIP*, ALK, APC*, ATM*, AXIN2*, BAP1*, BARD1*, BLM*, BMPR1A*, BRCA1*, BRCA2*, BRIP1*, CASR, CDC73*, CDH1*, CDK4, CDKN1B*, CDKN1C*, CDKN2A, CEBPA, CHEK2*, CTNNA1*, DICER1*, DIS3L2*, EGFR, EPCAM, FH*, FLCN*, GATA2*, GPC3, GREM1, HOXB13, HRAS, KIT, MAX*, MEN1*, MET, MITF, MLH1*, MSH2*, MSH3*, MSH6*, MUTYH*, NBN*, NF1*, NF2*, NTHL1*, PALB2*, PDGFRA, PHOX2B, PMS2*, POLD1*, POLE*, POT1*, PRKAR1A*, PTCH1*, PTEN*, RAD50*, RAD51C*, RAD51D*, RB1*, RECQL4, RET, RUNX1*, SDHA*, SDHAF2*, SDHB*, SDHC*, SDHD*, SMAD4*, SMARCA4*, SMARCB1*, SMARCE1*, STK11*, SUFU*, TERC, TERT, TMEM127*, Tp53*, TSC1*, TSC2*, VHL*, WRN*, and WT1.  RNA analysis is performed for * genes.     INTERVAL HISTORY:  Mackenzie Lewis is here today for repeat clinical assessment for her stage IVB gastric cancer. Patient states that she does not feel the best as she is still recovering from the flu. She complains of increased fatigue, lower abdominal discomfort, dizziness, severely poor appetite, nausea, and vomiting. I gave her  an prescription for a wheelchair today. She continues carafate  about twice daily and I informed her that she can take this about 4 times a day accompanied with her nausea medication throughout the day. I will also prescribe her with dexamethasone  4 mg. She inquired if at some point will she be unable to eat food and I informed her that there may be a time when she will only be on a liquid diet and that a feeding tube would not be beneficial with her extensive gastric involvement, similar to a linitis plastica. I doubt it would even be feasible to place a feeding tube. We discussed Hospice and I explained if she was to decide to go into Hospice then she cannot receive IV fluids, tests, and scans from me, but I would still be her attending physician and could see her as needed. She has decided to think about this for now. She has an elevated WBC of 15.8, low but stable hemoglobin of 11.6, and platelet count of 320,000. Her CMP is fairly normal other than an elevated glucose of 146, creatinine of 1.29, and low total protein of 6.3, and potassium of  3.1. She currently takes one oral potassium supplement and I instructed her to increased this to BID. She was found to be iron deficient on 02/21/2024 and was prescribe with oral iron supplement which she has not started yet. I will order IV iron and 20 meq of IV potassium for her to receive today. She will get IV fluids in 1 week. Then she will start weekly follow-ups and labs in February after in view of her declining condition. She denies fever, chills, night sweats, or other signs of infection. She denies cardiorespiratory and gastrointestinal issues. Her appetite is very poor and she states she cannot keep food down without medication. Her weight has decreased 10 pounds over last 4 weeks. This patient is accompanied in the office by her daughter.    REVIEW OF SYSTEMS:  Review of Systems  Constitutional:  Positive for appetite change (poor) and fatigue (Mild  fatigue). Negative for chills, fever and unexpected weight change.  HENT:  Negative.  Negative for hearing loss, lump/mass, mouth sores, nosebleeds, sore throat and trouble swallowing.   Eyes: Negative.   Respiratory: Negative.  Negative for chest tightness, cough, hemoptysis, shortness of breath and wheezing.   Cardiovascular: Negative.  Negative for chest pain, leg swelling and palpitations.  Gastrointestinal:  Positive for abdominal pain (lower abdominal discomfort), nausea (Intermittent) and vomiting (after eating). Negative for abdominal distention, blood in stool, constipation and diarrhea.  Endocrine: Negative.  Negative for hot flashes.  Genitourinary: Negative.  Negative for bladder incontinence, difficulty urinating, dyspareunia, dysuria, frequency, hematuria, menstrual problem, nocturia, pelvic pain, vaginal bleeding and vaginal discharge.   Musculoskeletal:  Positive for gait problem (Unsteady). Negative for arthralgias, back pain, flank pain, myalgias and neck stiffness.  Skin: Negative.  Negative for itching, rash and wound.  Neurological:  Positive for dizziness and gait problem (Unsteady). Negative for extremity weakness, headaches, light-headedness, numbness, seizures and speech difficulty.  Hematological: Negative.  Negative for adenopathy. Does not bruise/bleed easily.  Psychiatric/Behavioral: Negative.  Negative for depression and sleep disturbance. The patient is not nervous/anxious.      VITALS:   Vitals:   03/07/24 1130 03/07/24 1135  BP: 123/64 123/63  Pulse: 96 (!) 104  Resp: 18   Temp: 98.1 F (36.7 C)   SpO2: 97%    Wt Readings from Last 3 Encounters:  03/07/24 197 lb 6.4 oz (89.5 kg)  02/07/24 207 lb 1.6 oz (93.9 kg)  01/24/24 208 lb (94.3 kg)    Body mass index is 33.88 kg/m.  Performance status (ECOG): 1 - Symptomatic but completely ambulatory  PHYSICAL EXAM:  Physical Exam Vitals and nursing note reviewed. Exam conducted with a chaperone present.   Constitutional:      General: She is not in acute distress.    Appearance: Normal appearance. She is normal weight. She is not ill-appearing.  HENT:     Head: Normocephalic and atraumatic.     Right Ear: Tympanic membrane, ear canal and external ear normal. There is no impacted cerumen.     Left Ear: Tympanic membrane, ear canal and external ear normal. There is no impacted cerumen.     Nose: Nose normal. No congestion or rhinorrhea.     Mouth/Throat:     Mouth: Mucous membranes are moist.     Pharynx: Oropharynx is clear. No oropharyngeal exudate or posterior oropharyngeal erythema.  Eyes:     General: No scleral icterus.       Right eye: No discharge.        Left eye:  No discharge.     Extraocular Movements: Extraocular movements intact.     Conjunctiva/sclera: Conjunctivae normal.     Pupils: Pupils are equal, round, and reactive to light.  Cardiovascular:     Rate and Rhythm: Normal rate and regular rhythm.     Pulses: Normal pulses.     Heart sounds: Normal heart sounds. No murmur heard.    No friction rub. No gallop.  Pulmonary:     Effort: Pulmonary effort is normal. No respiratory distress.     Breath sounds: Normal breath sounds. No stridor. No wheezing, rhonchi or rales.  Chest:     Chest wall: No tenderness.  Abdominal:     General: Bowel sounds are normal. There is no distension.     Palpations: Abdomen is soft. There is no hepatomegaly, splenomegaly or mass.     Tenderness: There is abdominal tenderness in the epigastric area. There is no right CVA tenderness, left CVA tenderness, guarding or rebound.     Hernia: No hernia is present.     Comments: Mild increase in the mass of the upper mid abdomen   Musculoskeletal:        General: Normal range of motion.     Cervical back: Normal range of motion and neck supple. No tenderness.     Right lower leg: No edema.     Left lower leg: No edema.  Lymphadenopathy:     Cervical: No cervical adenopathy.     Right  cervical: No superficial, deep or posterior cervical adenopathy.    Left cervical: No superficial, deep or posterior cervical adenopathy.     Upper Body:     Right upper body: No supraclavicular, axillary or pectoral adenopathy.     Left upper body: No supraclavicular, axillary or pectoral adenopathy.  Skin:    General: Skin is warm and dry.     Coloration: Skin is not jaundiced or pale.     Findings: No bruising, erythema, lesion or rash.  Neurological:     General: No focal deficit present.     Mental Status: She is alert and oriented to person, place, and time. Mental status is at baseline.     Cranial Nerves: No cranial nerve deficit.     Sensory: No sensory deficit.     Motor: No weakness.     Coordination: Coordination normal.     Gait: Gait normal.     Deep Tendon Reflexes: Reflexes normal.  Psychiatric:        Mood and Affect: Mood normal.        Behavior: Behavior normal.        Thought Content: Thought content normal.        Judgment: Judgment normal.    LABS:      Latest Ref Rng & Units 03/07/2024   11:07 AM 02/28/2024    1:00 PM 02/21/2024   10:46 AM  CBC  WBC 4.0 - 10.5 K/uL 15.8  13.2  9.2   Hemoglobin 12.0 - 15.0 g/dL 88.3  88.4  88.7   Hematocrit 36.0 - 46.0 % 34.8  35.9  34.3   Platelets 150 - 400 K/uL 320  297  261       Latest Ref Rng & Units 03/07/2024   11:07 AM 02/28/2024    1:00 PM 02/21/2024   10:46 AM  CMP  Glucose 70 - 99 mg/dL 853  879  835   BUN 8 - 23 mg/dL 20  21  18    Creatinine  0.44 - 1.00 mg/dL 8.70  8.66  8.67   Sodium 135 - 145 mmol/L 135  135  139   Potassium 3.5 - 5.1 mmol/L 3.1  3.4  3.9   Chloride 98 - 111 mmol/L 100  99  104   CO2 22 - 32 mmol/L 22  22  24    Calcium  8.9 - 10.3 mg/dL 9.5  9.3  9.4   Total Protein 6.5 - 8.1 g/dL 6.3  6.5  6.4   Total Bilirubin 0.0 - 1.2 mg/dL 0.4  0.4  0.4   Alkaline Phos 38 - 126 U/L 82  92  91   AST 15 - 41 U/L 19  20  21    ALT 0 - 44 U/L 13  17  16     Lab Results  Component Value Date   CEA1  2.9 09/10/2021   /  CEA  Date Value Ref Range Status  09/10/2021 2.9 0.0 - 4.7 ng/mL Final    Comment:    (NOTE)                             Nonsmokers          <3.9                             Smokers             <5.6 [employer] Electrochemiluminescence Immunoassay (ECLIA) Values obtained with different assay methods or kits cannot be used interchangeably.  Results cannot be interpreted as absolute evidence of the presence or absence of malignant disease. Performed At: [facility] [address] [place], [place] [id number] Jennette Shorter MD Ey:[id number]    Lab Results  Component Value Date   TIBC 421 02/21/2024   FERRITIN 14 02/21/2024   IRONPCTSAT 9 (L) 02/21/2024   STUDIES:  DG Chest 2 View Result Date: 02/24/2024 EXAM: 2 VIEW(S) XRAY OF THE CHEST 02/24/2024 01:32:52 PM COMPARISON: CT of 01/11/2024. Plain film of 02/01/2021. Plain film of 02/01/2022 from [facility] also reviewed. CLINICAL HISTORY: cough FINDINGS: LINES, TUBES AND DEVICES: Left Port-A-Cath tip at superior cavoatrial junction. LUNGS AND PLEURA: Improved bibasilar aeration with mild, nonspecific interstitial prominence remaining. No pleural effusion. No pneumothorax. HEART AND MEDIASTINUM: Mild cardiomegaly. BONES AND SOFT TISSUES: No acute osseous abnormality. IMPRESSION: 1. Cardiomegaly with improved bibasilar aeration. 2. No acute findings. Electronically signed by: Rockey Kilts MD MD 02/24/2024 02:36 PM EST RP Workstation: [id number]    EXAM: 01/11/2024 CT CHEST, ABDOMEN, AND PELVIS WITH CONTRAST IMPRESSION: 1. Progressive wall thickening of the gastric antrum/pylorus with nodular areas of hyperenhancement along the superior margin, with progressive adjacent fat stranding and increased size of some soft tissue nodule/lymph nodes, as well as progressive nodular stranding along the celiac axis, along the gastrohepatic ligament, and in the anterior omentum, and new focus of nodular  thickening along the left pericolic gutter. Findings are compatible with recurrent/worsening gastric cancer with peritoneal carcinomatosis. 2. Progressive ill-defined nodular thickening along the lateral limb of the right adrenal gland measuring 16 mm on image 61/301 is suspicious for metastatic disease involvement. 3. Stable scattered left lower lobe pulmonary nodules common nonspecific suggest continued attention on follow-up imaging. No new suspicious pulmonary nodules or masses. 4. Similar symmetric distal esophageal wall thickening, which may reflect esophagitis.  HISTORY:   Past Medical History:  Diagnosis Date   Abdominal pain 01/17/2023   Appendicitis with  peritonitis 04/10/2016   Atypical chest pain 09/09/2016   Benign hypertensive renal disease 09/01/2016   Bilateral primary osteoarthritis of knee 01/20/2016   Borderline diabetes 09/09/2016   CKD (chronic kidney disease), stage II 09/01/2016   Cyclic citrullinated peptide (CCP) antibody positive 01/20/2016   Because she has positive CCP, I want to make sure we monitor the patient closely and we encouraged the patient to look for symptoms that include increased hand stiffness, swelling and redness to the MCP joint.  If that happens, she is to call us  so that we can schedule her for an ultrasound to look for synovitis.     Essential hypertension 09/09/2016   Gastric cancer (HCC) 09/10/2021   Hyperlipidemia 09/01/2016   Hypertension    Hypothyroidism 09/01/2016   Osteoarthritis of both feet 01/20/2016   Osteoarthritis, hand 01/20/2016   Thyroid  disease     Past Surgical History:  Procedure Laterality Date   APPENDECTOMY     LAPAROSCOPIC APPENDECTOMY N/A 04/10/2016   Procedure: APPENDECTOMY LAPAROSCOPIC;  Surgeon: Vicenta Poli, MD;  Location: [facility] OR;  Service: General;  Laterality: N/A;    Family History  Problem Relation Age of Onset   Hypertension Mother    Prostate cancer Father        metastatic; d. 7    Brain cancer Sister 15   Breast cancer Sister 86   AAA (abdominal aortic aneurysm) Brother    Leukemia Cousin        x2 maternal female cousins; d. before 33   Breast cancer Daughter 34       DCIS    Social History:  reports that she has never smoked. She has never used smokeless tobacco. She reports that she does not currently use alcohol . She reports that she does not use drugs.The patient is accompanied by her daughter and 2 friends today.  Allergies: Allergies[1]  Current Medications: Current Outpatient Medications  Medication Sig Dispense Refill   potassium chloride  SA (KLOR-CON  M) 20 MEQ tablet TAKE 1 TABLET TWICE A DAY 180 tablet 3   dexamethasone  (DECADRON ) 4 MG tablet Take 1 tablet (4 mg total) by mouth daily. 30 tablet 5   ELIQUIS  5 MG TABS tablet TAKE 1 TABLET TWICE A DAY 180 tablet 3   famotidine (PEPCID) 40 MG tablet Take 40 mg by mouth at bedtime.     folic acid (FOLVITE) 1 MG tablet Take 1 mg by mouth daily.     Lactobacillus TABS Take 1 tablet by mouth 2 (two) times daily.     levothyroxine  (SYNTHROID , LEVOTHROID) 75 MCG tablet Take 75 mcg by mouth daily before breakfast.      loratadine  (CLARITIN ) 10 MG tablet Take 10 mg by mouth daily.     LORazepam  (ATIVAN ) 1 MG tablet Take 1 tablet (1 mg total) by mouth every 8 (eight) hours. 60 tablet 0   metoprolol  succinate (TOPROL  XL) 50 MG 24 hr tablet Take 1 tablet (50 mg total) by mouth daily. 90 tablet 3   NON FORMULARY Take 1 Dose by mouth See admin instructions. MMW: 3 parts Maalox 2 parts Benadryl  1 part viscious lidicaine  Disp. 6oz  Instructions: 5ml swish and swallow every 3-4 hours     omeprazole  (PRILOSEC) 40 MG capsule Take 1 capsule (40 mg total) by mouth 2 (two) times daily. 60 capsule 5   ondansetron  (ZOFRAN -ODT) 4 MG disintegrating tablet Take 1 tablet (4 mg total) by mouth every 8 (eight) hours as needed for nausea or vomiting. 90 tablet 3  polyethylene glycol powder (GLYCOLAX /MIRALAX ) 17 GM/SCOOP powder  Take 1 Container by mouth daily.     Probiotic Product (PROBIOTIC DAILY PO) Take 1 tablet by mouth daily.     prochlorperazine  (COMPAZINE ) 10 MG tablet TAKE 1 TABLET BY MOUTH EVERY 6 HOURS AS NEEDED FOR NAUSEA OR VOMITING. 90 tablet 3   promethazine -dextromethorphan (PROMETHAZINE -DM) 6.25-15 MG/5ML syrup Take 2.5 mLs by mouth every 6 (six) hours as needed for cough. Do not use and drive - May make drowsy. 118 mL 0   sucralfate  (CARAFATE ) 1 g tablet Take 1 tablet (1 g total) by mouth 4 (four) times daily -  with meals and at bedtime. 100 tablet 5   traMADol  (ULTRAM ) 50 MG tablet Take 1 tablet (50 mg total) by mouth every 6 (six) hours as needed. 30 tablet 0   valsartan (DIOVAN) 160 MG tablet Take 160 mg by mouth daily.     No current facility-administered medications for this visit.    I,Jasmine M Lassiter,acting as a scribe for [name], MD.,have documented all relevant documentation on the behalf of [name], MD,as directed by  [name], MD while in the presence of [name], MD.       [1]  Allergies Allergen Reactions   Doxycycline Rash   Sulfa Antibiotics Other (See Comments) and Rash    Other reaction(s): Other (See Comments)  Made me feel weird   "

## 2024-03-13 ENCOUNTER — Encounter: Payer: Self-pay | Admitting: Oncology

## 2024-03-14 ENCOUNTER — Inpatient Hospital Stay

## 2024-03-14 ENCOUNTER — Other Ambulatory Visit: Payer: Self-pay | Admitting: Pharmacist

## 2024-03-14 VITALS — BP 134/62 | HR 78 | Temp 99.0°F | Resp 18

## 2024-03-14 DIAGNOSIS — C168 Malignant neoplasm of overlapping sites of stomach: Secondary | ICD-10-CM

## 2024-03-14 DIAGNOSIS — E876 Hypokalemia: Secondary | ICD-10-CM

## 2024-03-14 DIAGNOSIS — E860 Dehydration: Secondary | ICD-10-CM

## 2024-03-14 DIAGNOSIS — R7989 Other specified abnormal findings of blood chemistry: Secondary | ICD-10-CM

## 2024-03-14 DIAGNOSIS — C169 Malignant neoplasm of stomach, unspecified: Secondary | ICD-10-CM | POA: Diagnosis not present

## 2024-03-14 LAB — CMP (CANCER CENTER ONLY)
ALT: 19 U/L (ref 0–44)
AST: 16 U/L (ref 15–41)
Albumin: 4.1 g/dL (ref 3.5–5.0)
Alkaline Phosphatase: 81 U/L (ref 38–126)
Anion gap: 13 (ref 5–15)
BUN: 22 mg/dL (ref 8–23)
CO2: 22 mmol/L (ref 22–32)
Calcium: 9.6 mg/dL (ref 8.9–10.3)
Chloride: 103 mmol/L (ref 98–111)
Creatinine: 1.36 mg/dL — ABNORMAL HIGH (ref 0.44–1.00)
GFR, Estimated: 38 mL/min — ABNORMAL LOW
Glucose, Bld: 118 mg/dL — ABNORMAL HIGH (ref 70–99)
Potassium: 3.4 mmol/L — ABNORMAL LOW (ref 3.5–5.1)
Sodium: 137 mmol/L (ref 135–145)
Total Bilirubin: 0.4 mg/dL (ref 0.0–1.2)
Total Protein: 6.5 g/dL (ref 6.5–8.1)

## 2024-03-14 LAB — CBC WITH DIFFERENTIAL (CANCER CENTER ONLY)
Abs Immature Granulocytes: 0.13 10*3/uL — ABNORMAL HIGH (ref 0.00–0.07)
Basophils Absolute: 0 10*3/uL (ref 0.0–0.1)
Basophils Relative: 0 %
Eosinophils Absolute: 0.1 10*3/uL (ref 0.0–0.5)
Eosinophils Relative: 1 %
HCT: 37.2 % (ref 36.0–46.0)
Hemoglobin: 12.1 g/dL (ref 12.0–15.0)
Immature Granulocytes: 1 %
Lymphocytes Relative: 17 %
Lymphs Abs: 2.5 10*3/uL (ref 0.7–4.0)
MCH: 25.5 pg — ABNORMAL LOW (ref 26.0–34.0)
MCHC: 32.5 g/dL (ref 30.0–36.0)
MCV: 78.3 fL — ABNORMAL LOW (ref 80.0–100.0)
Monocytes Absolute: 1.5 10*3/uL — ABNORMAL HIGH (ref 0.1–1.0)
Monocytes Relative: 10 %
Neutro Abs: 11 10*3/uL — ABNORMAL HIGH (ref 1.7–7.7)
Neutrophils Relative %: 71 %
Platelet Count: 307 10*3/uL (ref 150–400)
RBC: 4.75 MIL/uL (ref 3.87–5.11)
RDW: 16.6 % — ABNORMAL HIGH (ref 11.5–15.5)
WBC Count: 15.3 10*3/uL — ABNORMAL HIGH (ref 4.0–10.5)
nRBC: 0 % (ref 0.0–0.2)

## 2024-03-14 MED ORDER — ONDANSETRON HCL 4 MG/2ML IJ SOLN
8.0000 mg | Freq: Once | INTRAMUSCULAR | Status: AC
  Administered 2024-03-14: 8 mg via INTRAVENOUS
  Filled 2024-03-14: qty 4

## 2024-03-14 MED ORDER — SODIUM CHLORIDE 0.9 % IV SOLN: Freq: Once | INTRAVENOUS | Status: AC

## 2024-03-14 MED ORDER — POTASSIUM CHLORIDE 10 MEQ/100ML IV SOLN
10.0000 meq | Freq: Once | INTRAVENOUS | Status: AC
  Administered 2024-03-14: 10 meq via INTRAVENOUS
  Filled 2024-03-14: qty 100

## 2024-03-14 MED ORDER — DEXAMETHASONE SOD PHOSPHATE PF 10 MG/ML IJ SOLN
8.0000 mg | Freq: Once | INTRAMUSCULAR | Status: AC
  Administered 2024-03-14: 8 mg via INTRAVENOUS
  Filled 2024-03-14: qty 1

## 2024-03-14 NOTE — Patient Instructions (Signed)
 Dehydration, Adult Dehydration is a condition in which there is not enough water or other fluids in the body. This happens when a person loses more fluids than they take in. Important organs cannot work right without the right amount of fluids. Any loss of fluids from the body can cause dehydration. Dehydration can be mild, worse, or very bad. It should be treated right away to keep it from getting very bad. What are the causes? Conditions that cause loss of water in the body. They include: Watery poop (diarrhea). Vomiting. Sweating a lot. Fever. Infection. Peeing (urinating) a lot. Not drinking enough fluids. Certain medicines, such as medicines that take extra fluid out of the body (diuretics). Lack of safe drinking water. Not being able to get enough water and food. What increases the risk? Having a long-term (chronic) illness that has not been treated the right way, such as: Diabetes. Heart disease. Kidney disease. Being 25 years of age or older. Having a disability. Living in a place that is high above the ground or sea (high in altitude). The thinner, drier air causes more fluid loss. Doing exercises that put stress on your body for a long time. Being active when in hot places. What are the signs or symptoms? Symptoms of dehydration depend on how bad it is. Mild or worse dehydration Thirst. Dry lips or dry mouth. Feeling dizzy or light-headed. Muscle cramps. Passing little pee or dark pee. Pee may be the color of tea. Headache. Very bad dehydration Changes in skin. Skin may: Be cold to the touch (clammy). Be blotchy or pale. Not go back to normal right after you pinch it and let it go. Little or no tears, pee, or sweat. Fast breathing. Low blood pressure. Weak pulse. Pulse that is more than 100 beats a minute when you are sitting still. Other changes, such as: Feeling very thirsty. Eyes that look hollow (sunken). Cold hands and feet. Being confused. Being very  tired (lethargic) or having trouble waking from sleep. Losing weight. Loss of consciousness. How is this treated? Treatment for this condition depends on how bad your dehydration is. Treatment should start right away. Do not wait until your condition gets very bad. Very bad dehydration is an emergency. You will need to go to a hospital. Mild or worse dehydration can be treated at home. You may be asked to: Drink more fluids. Drink an oral rehydration solution (ORS). This drink gives you the right amount of fluids, salts, and minerals (electrolytes). Very bad dehydration can be treated: With fluids through an IV tube. By correcting low levels of electrolytes in the body. By treating the problem that caused your dehydration. Follow these instructions at home: Oral rehydration solution If told by your doctor, drink an ORS: Make an ORS. Use instructions on the package. Start by drinking small amounts, about  cup (120 mL) every 5-10 minutes. Slowly drink more until you have had the amount that your doctor said to have.  Eating and drinking  Drink enough clear fluid to keep your pee pale yellow. If you were told to drink an ORS, finish the ORS first. Then, start slowly drinking other clear fluids. Drink fluids such as: Water. Do not drink only water. Doing that can make the salt (sodium) level in your body get too low. Water from ice chips you suck on. Fruit juice that you have added water to (diluted). Low-calorie sports drinks. Eat foods that have the right amounts of salts and minerals, such as bananas, oranges, potatoes,  tomatoes, or spinach. Do not drink alcohol. Avoid drinks that have caffeine or sugar. These include:: High-calorie sports drinks. Fruit juice that you did not add water to. Soda. Coffee or energy drinks. Avoid foods that are greasy or have a lot of fat or sugar. General instructions Take over-the-counter and prescription medicines only as told by your doctor. Do  not take sodium tablets. Doing that can make the salt level in your body get too high. Return to your normal activities as told by your doctor. Ask your doctor what activities are safe for you. Keep all follow-up visits. Your doctor may check and change your treatment. Contact a doctor if: You have pain in your belly (abdomen) and the pain: Gets worse. Stays in one place. You have a rash. You have a stiff neck. You get angry or annoyed more easily than normal. You are more tired or have a harder time waking than normal. You feel weak or dizzy. You feel very thirsty. Get help right away if: You have any symptoms of very bad dehydration. You vomit every time you eat or drink. Your vomiting gets worse, does not go away, or you vomit blood or green stuff. You are getting treatment, but symptoms are getting worse. You have a fever. You have a very bad headache. You have: Diarrhea that gets worse or does not go away. Blood in your poop (stool). This may cause poop to look black and tarry. No pee in 6-8 hours. Only a small amount of pee in 6-8 hours, and the pee is very dark. You have trouble breathing. These symptoms may be an emergency. Get help right away. Call 911. Do not wait to see if the symptoms will go away. Do not drive yourself to the hospital. This information is not intended to replace advice given to you by your health care provider. Make sure you discuss any questions you have with your health care provider. Document Revised: 08/31/2021 Document Reviewed: 08/31/2021 Elsevier Patient Education  2024 [employer].

## 2024-03-21 ENCOUNTER — Inpatient Hospital Stay: Admitting: Hematology and Oncology

## 2024-03-21 ENCOUNTER — Inpatient Hospital Stay

## 2024-03-23 ENCOUNTER — Inpatient Hospital Stay: Attending: Hematology and Oncology | Admitting: Hematology and Oncology

## 2024-03-23 ENCOUNTER — Inpatient Hospital Stay

## 2024-03-23 ENCOUNTER — Inpatient Hospital Stay: Attending: Hematology and Oncology

## 2024-03-23 VITALS — BP 115/55 | HR 74 | Temp 98.1°F | Resp 18 | Ht 64.0 in | Wt 190.0 lb

## 2024-03-23 DIAGNOSIS — E876 Hypokalemia: Secondary | ICD-10-CM

## 2024-03-23 DIAGNOSIS — E860 Dehydration: Secondary | ICD-10-CM

## 2024-03-23 DIAGNOSIS — C168 Malignant neoplasm of overlapping sites of stomach: Secondary | ICD-10-CM

## 2024-03-23 DIAGNOSIS — R7989 Other specified abnormal findings of blood chemistry: Secondary | ICD-10-CM

## 2024-03-23 LAB — CMP (CANCER CENTER ONLY)
ALT: 17 U/L (ref 0–44)
AST: 15 U/L (ref 15–41)
Albumin: 3.9 g/dL (ref 3.5–5.0)
Alkaline Phosphatase: 71 U/L (ref 38–126)
Anion gap: 12 (ref 5–15)
BUN: 25 mg/dL — ABNORMAL HIGH (ref 8–23)
CO2: 22 mmol/L (ref 22–32)
Calcium: 9.4 mg/dL (ref 8.9–10.3)
Chloride: 99 mmol/L (ref 98–111)
Creatinine: 1.5 mg/dL — ABNORMAL HIGH (ref 0.44–1.00)
GFR, Estimated: 34 mL/min — ABNORMAL LOW
Glucose, Bld: 160 mg/dL — ABNORMAL HIGH (ref 70–99)
Potassium: 3.4 mmol/L — ABNORMAL LOW (ref 3.5–5.1)
Sodium: 133 mmol/L — ABNORMAL LOW (ref 135–145)
Total Bilirubin: 0.5 mg/dL (ref 0.0–1.2)
Total Protein: 6.2 g/dL — ABNORMAL LOW (ref 6.5–8.1)

## 2024-03-23 LAB — CBC WITH DIFFERENTIAL (CANCER CENTER ONLY)
Abs Immature Granulocytes: 0.15 10*3/uL — ABNORMAL HIGH (ref 0.00–0.07)
Basophils Absolute: 0 10*3/uL (ref 0.0–0.1)
Basophils Relative: 0 %
Eosinophils Absolute: 0 10*3/uL (ref 0.0–0.5)
Eosinophils Relative: 0 %
HCT: 38.2 % (ref 36.0–46.0)
Hemoglobin: 12.9 g/dL (ref 12.0–15.0)
Immature Granulocytes: 1 %
Lymphocytes Relative: 7 %
Lymphs Abs: 1.1 10*3/uL (ref 0.7–4.0)
MCH: 26.3 pg (ref 26.0–34.0)
MCHC: 33.8 g/dL (ref 30.0–36.0)
MCV: 78 fL — ABNORMAL LOW (ref 80.0–100.0)
Monocytes Absolute: 0.4 10*3/uL (ref 0.1–1.0)
Monocytes Relative: 2 %
Neutro Abs: 14.5 10*3/uL — ABNORMAL HIGH (ref 1.7–7.7)
Neutrophils Relative %: 90 %
Platelet Count: 230 10*3/uL (ref 150–400)
RBC: 4.9 MIL/uL (ref 3.87–5.11)
RDW: 17.8 % — ABNORMAL HIGH (ref 11.5–15.5)
WBC Count: 16.1 10*3/uL — ABNORMAL HIGH (ref 4.0–10.5)
nRBC: 0 % (ref 0.0–0.2)

## 2024-03-23 MED ORDER — DEXAMETHASONE SOD PHOSPHATE PF 10 MG/ML IJ SOLN
8.0000 mg | Freq: Once | INTRAMUSCULAR | Status: AC
  Administered 2024-03-23: 8 mg via INTRAVENOUS
  Filled 2024-03-23: qty 1

## 2024-03-23 MED ORDER — LORAZEPAM 1 MG PO TABS: 1.0000 mg | ORAL_TABLET | Freq: Three times a day (TID) | ORAL | 0 refills | Status: AC | PRN

## 2024-03-23 MED ORDER — ONDANSETRON HCL 4 MG/2ML IJ SOLN
8.0000 mg | Freq: Once | INTRAMUSCULAR | Status: AC
  Administered 2024-03-23: 8 mg via INTRAVENOUS
  Filled 2024-03-23: qty 4

## 2024-03-23 MED ORDER — POTASSIUM CHLORIDE 10 MEQ/100ML IV SOLN
10.0000 meq | INTRAVENOUS | Status: AC
  Administered 2024-03-23 (×2): 10 meq via INTRAVENOUS
  Filled 2024-03-23 (×2): qty 100

## 2024-03-23 MED ORDER — SODIUM CHLORIDE 0.9 % IV SOLN: Freq: Once | INTRAVENOUS | Status: AC

## 2024-03-23 NOTE — Patient Instructions (Signed)
 Not Enough Water in the Body (Dehydration) in Adults: What to Know Dehydration is a condition in which there is not enough water or other fluids in the body. This happens when a person loses more fluids than they take in. Important organs cannot work right without the right amount of fluids. Any loss of fluids from the body can cause dehydration. Dehydration can be mild, worse, or very bad. It should be treated right away to keep it from getting very bad. What are the causes? Conditions that cause loss of water in the body. They include: Watery poop (diarrhea). Vomiting. Sweating a lot. Fever. Infection. Peeing (urinating) a lot. Not drinking enough fluids. Certain medicines, such as medicines that take extra fluid out of the body (diuretics). Lack of safe drinking water. Not being able to get enough water and food. What increases the risk? Having a long-term (chronic) illness that has not been treated the right way, such as: Diabetes. Heart disease. Kidney disease. Being 17 years of age or older. Having a disability. Living in a place that is high above the ground or sea (high in altitude). The thinner, drier air causes more fluid loss. Doing exercises that put stress on your body for a long time. Being active when in hot places. What are the signs or symptoms? Symptoms of dehydration depend on how bad it is. Mild or worse dehydration Thirst. Dry lips or dry mouth. Feeling dizzy or light-headed. Muscle cramps. Passing little pee or dark pee. Pee may be the color of tea. Headache. Very bad dehydration Changes in skin. Skin may: Be cold to the touch (clammy). Be blotchy or pale. Not go back to normal right after you pinch it and let it go. Little or no tears, pee, or sweat. Fast breathing. Low blood pressure. Weak pulse. Pulse that is more than 100 beats a minute when you are sitting still. Other changes, such as: Feeling very thirsty. Eyes that look hollow  (sunken). Cold hands and feet. Being confused. Being very tired (lethargic) or having trouble waking from sleep. Losing weight. Loss of consciousness. How is this treated? Treatment for this condition depends on how bad your dehydration is. Treatment should start right away. Do not wait until your condition gets very bad. Very bad dehydration is an emergency. You will need to go to a hospital. Mild or worse dehydration can be treated at home. You may be asked to: Drink more fluids. Drink an oral rehydration solution (ORS). This drink gives you the right amount of fluids, salts, and minerals (electrolytes). Very bad dehydration can be treated: With fluids through an IV tube. By correcting low levels of electrolytes in the body. By treating the problem that caused your dehydration. Follow these instructions at home: Oral rehydration solution If told by your doctor, drink an ORS: Make an ORS. Use instructions on the package. Start by drinking small amounts, about  cup (120 mL) every 5-10 minutes. Slowly drink more until you have had the amount that your doctor said to have.  Eating and drinking  Drink enough clear fluid to keep your pee pale yellow. If you were told to drink an ORS, finish the ORS first. Then, start slowly drinking other clear fluids. Drink fluids such as: Water. Do not drink only water. Doing that can make the salt (sodium) level in your body get too low. Water from ice chips you suck on. Fruit juice that you have added water to (diluted). Low-calorie sports drinks. Eat foods that have the right  amounts of salts and minerals, such as bananas, oranges, potatoes, tomatoes, or spinach. Do not drink alcohol . Avoid drinks that have caffeine or sugar. These include:: High-calorie sports drinks. Fruit juice that you did not add water to. Soda. Coffee or energy drinks. Avoid foods that are greasy or have a lot of fat or sugar. General instructions Take over-the-counter  and prescription medicines only as told by your doctor. Do not take sodium tablets. Doing that can make the salt level in your body get too high. Return to your normal activities as told by your doctor. Ask your doctor what activities are safe for you. Keep all follow-up visits. Your doctor may check and change your treatment. Contact a doctor if: You have pain in your belly (abdomen) and the pain: Gets worse. Stays in one place. You have a rash. You have a stiff neck. You get angry or annoyed more easily than normal. You are more tired or have a harder time waking than normal. You feel weak or dizzy. You feel very thirsty. Get help right away if: You have any symptoms of very bad dehydration. You vomit every time you eat or drink. Your vomiting gets worse, does not go away, or you vomit blood or green stuff. You are getting treatment, but symptoms are getting worse. You have a fever. You have a very bad headache. You have: Diarrhea that gets worse or does not go away. Blood in your poop (stool). This may cause poop to look black and tarry. No pee in 6-8 hours. Only a small amount of pee in 6-8 hours, and the pee is very dark. You have trouble breathing. These symptoms may be an emergency. Get help right away. Call 911. Do not wait to see if the symptoms will go away. Do not drive yourself to the hospital. This information is not intended to replace advice given to you by your health care provider. Make sure you discuss any questions you have with your health care provider. Document Revised: 12/09/2023 Document Reviewed: 08/31/2021 Elsevier Patient Education  2025 [employer].

## 2024-03-23 NOTE — Progress Notes (Unsigned)
 " [facility]  [address] [place],  [place]  [address] [phone]  Clinic Day:  03/23/2024  Referring physician: Jefferey Fitch, MD  ASSESSMENT & PLAN:   Assessment & Plan: No problem-specific Assessment & Plan notes found for this encounter.    The patient understands the plans discussed today and is in agreement with them.  She knows to contact our office if she develops concerns prior to her next appointment.   I provided *** minutes of face-to-face time during this encounter and > 50% was spent counseling as documented under my assessment and plan.    Jearldean Gutt A Yahshua Thibault, PA-C   [facility] [place] - A DEPT OF [facility]. [facility] [address] [place] [address] Dept: [phone] Dept Fax: [phone]   No orders of the defined types were placed in this encounter.     CHIEF COMPLAINT:  CC: ***  Current Treatment:  ***  HISTORY OF PRESENT ILLNESS:   Oncology History  Gastric cancer (HCC)  09/10/2021 Initial Diagnosis   Gastric cancer (HCC)   09/10/2021 Cancer Staging   Staging form: Stomach, AJCC 8th Edition - Clinical stage from 09/10/2021: Stage IVB (cT4b, cN0, cM1) - Signed by Cornelius Wanda VEAR, MD on 09/10/2021 Histopathologic type: Adenocarcinoma, NOS Stage prefix: Initial diagnosis Total positive nodes: 0 Histologic grade (G): G3 Histologic grading system: 3 grade system Sites of metastasis: Peritoneal surface Diagnostic confirmation: Positive histology PLUS positive immunophenotyping and/or positive genetic studies Specimen type: Endoscopy with Biopsy Staged by: Managing physician Carcinoembryonic antigen (CEA) (ng/mL): 2.8 Carbohydrate antigen 19-9 (CA 19-9) (U/mL): 4.9 HER2 status: Unknown Microsatellite instability (MSI): Unknown Tumor location in stomach: Other Clinical staging modalities: Biopsy, Endoscopy Stage used in treatment planning: Yes National guidelines used in  treatment planning: Yes Type of national guideline used in treatment planning: NCCN   09/28/2021 - 10/14/2021 Chemotherapy   Patient is on Treatment Plan : GASTROESOPHAGEAL FOLFOX + Nivolumab  q14d     09/28/2021 - 12/29/2023 Chemotherapy   Patient is on Treatment Plan : GASTROESOPHAGEAL FOLFOX + Nivolumab  q21d (changed from q14d on 04/12/23)     12/09/2021 Genetic Testing   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  Report date is 12/09/2021.   The Multi-Cancer + RNA Panel offered by [facility] includes sequencing and/or deletion/duplication analysis of the following 84 genes:  AIP*, ALK, APC*, ATM*, AXIN2*, BAP1*, BARD1*, BLM*, BMPR1A*, BRCA1*, BRCA2*, BRIP1*, CASR, CDC73*, CDH1*, CDK4, CDKN1B*, CDKN1C*, CDKN2A, CEBPA, CHEK2*, CTNNA1*, DICER1*, DIS3L2*, EGFR, EPCAM, FH*, FLCN*, GATA2*, GPC3, GREM1, HOXB13, HRAS, KIT, MAX*, MEN1*, MET, MITF, MLH1*, MSH2*, MSH3*, MSH6*, MUTYH*, NBN*, NF1*, NF2*, NTHL1*, PALB2*, PDGFRA, PHOX2B, PMS2*, POLD1*, POLE*, POT1*, PRKAR1A*, PTCH1*, PTEN*, RAD50*, RAD51C*, RAD51D*, RB1*, RECQL4, RET, RUNX1*, SDHA*, SDHAF2*, SDHB*, SDHC*, SDHD*, SMAD4*, SMARCA4*, SMARCB1*, SMARCE1*, STK11*, SUFU*, TERC, TERT, TMEM127*, Tp53*, TSC1*, TSC2*, VHL*, WRN*, and WT1.  RNA analysis is performed for * genes.       INTERVAL HISTORY:   Inayah is here today for repeat clinical assessment. She denies fevers or chills. She denies pain. Her appetite is good. Her weight {Weight change:10426}.  REVIEW OF SYSTEMS:   Review of Systems - Oncology   VITALS:   Blood pressure (!) 115/55, pulse 74, temperature 98.1 F (36.7 C), temperature source Oral, resp. rate 18, height 5' 4 (1.626 m), weight 190 lb (86.2 kg), SpO2 97%.  Wt Readings from Last 3 Encounters:  03/23/24 190 lb (86.2 kg)  03/07/24 197 lb  6.4 oz (89.5 kg)  02/07/24 207 lb 1.6 oz (93.9 kg)    Body mass index is 32.61 kg/m.  Performance status (ECOG): {CHL ONC  [id number]    PHYSICAL EXAM:   Physical Exam  LABS:      Latest Ref Rng & Units 03/23/2024   11:11 AM 03/14/2024   12:57 PM 03/07/2024   11:07 AM  CBC  WBC 4.0 - 10.5 K/uL 16.1  15.3  15.8   Hemoglobin 12.0 - 15.0 g/dL 87.0  87.8  88.3   Hematocrit 36.0 - 46.0 % 38.2  37.2  34.8   Platelets 150 - 400 K/uL 230  307  320       Latest Ref Rng & Units 03/23/2024   11:11 AM 03/14/2024   12:57 PM 03/07/2024   11:07 AM  CMP  Glucose 70 - 99 mg/dL 839  881  853   BUN 8 - 23 mg/dL 25  22  20    Creatinine 0.44 - 1.00 mg/dL 8.49  8.63  8.70   Sodium 135 - 145 mmol/L 133  137  135   Potassium 3.5 - 5.1 mmol/L 3.4  3.4  3.1   Chloride 98 - 111 mmol/L 99  103  100   CO2 22 - 32 mmol/L 22  22  22    Calcium  8.9 - 10.3 mg/dL 9.4  9.6  9.5   Total Protein 6.5 - 8.1 g/dL 6.2  6.5  6.3   Total Bilirubin 0.0 - 1.2 mg/dL 0.5  0.4  0.4   Alkaline Phos 38 - 126 U/L 71  81  82   AST 15 - 41 U/L 15  16  19    ALT 0 - 44 U/L 17  19  13       Lab Results  Component Value Date   CEA1 2.9 09/10/2021   /  CEA  Date Value Ref Range Status  09/10/2021 2.9 0.0 - 4.7 ng/mL Final    Comment:    (NOTE)                             Nonsmokers          <3.9                             Smokers             <5.6 [employer] Electrochemiluminescence Immunoassay (ECLIA) Values obtained with different assay methods or kits cannot be used interchangeably.  Results cannot be interpreted as absolute evidence of the presence or absence of malignant disease. Performed At: [facility] [address] [place], [place] [id number] Jennette Shorter MD Ey:[phone]    No results found for: PSA No results found for: CAN199 No results found for: CAN125  No results found for: TOTALPROTELP, ALBUMINELP, A1GS, A2GS, BETS, BETA2SER, GAMS, MSPIKE, SPEI Lab Results  Component Value Date   TIBC 421 02/21/2024   FERRITIN 14 02/21/2024   IRONPCTSAT 9 (L) 02/21/2024   No results  found for: LDH  STUDIES:   DG Chest 2 View Result Date: 02/24/2024 EXAM: 2 VIEW(S) XRAY OF THE CHEST 02/24/2024 01:32:52 PM COMPARISON: CT of 01/11/2024. Plain film of 02/01/2021. Plain film of 02/01/2022 from [facility] also reviewed. CLINICAL HISTORY: cough FINDINGS: LINES, TUBES AND DEVICES: Left Port-A-Cath tip at superior cavoatrial junction. LUNGS AND PLEURA: Improved bibasilar aeration with mild, nonspecific interstitial prominence remaining. No  pleural effusion. No pneumothorax. HEART AND MEDIASTINUM: Mild cardiomegaly. BONES AND SOFT TISSUES: No acute osseous abnormality. IMPRESSION: 1. Cardiomegaly with improved bibasilar aeration. 2. No acute findings. 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  If that happens, she is to call us  so that we can schedule her for an ultrasound to look for synovitis.     Essential hypertension 09/09/2016   Gastric cancer (HCC) 09/10/2021   Hyperlipidemia 09/01/2016   Hypertension    Hypothyroidism 09/01/2016   Osteoarthritis of both feet 01/20/2016   Osteoarthritis, hand 01/20/2016   Thyroid  disease     Past Surgical History:  Procedure Laterality Date   APPENDECTOMY     LAPAROSCOPIC APPENDECTOMY N/A 04/10/2016   Procedure: APPENDECTOMY LAPAROSCOPIC;  Surgeon: Vicenta Poli, MD;  Location: [facility] OR;  Service: General;  Laterality: N/A;    Family History  Problem Relation Age of Onset   Hypertension Mother    Prostate cancer Father        metastatic; d. 55   Brain cancer Sister 49   Breast cancer Sister 72   AAA (abdominal aortic aneurysm) Brother    Leukemia Cousin        x2 maternal female cousins; d. before 57   Breast cancer Daughter 26       DCIS    Social History:  reports that she has never smoked. She has never used smokeless tobacco. She reports that she does not currently use alcohol . She reports that she does not use drugs.The patient is {Blank single:19197::alone,accompanied by} *** today.  Allergies: Allergies[1]  Current Medications: Current Outpatient Medications  Medication Sig Dispense Refill   metoprolol  succinate (TOPROL -XL) 25 MG 24 hr tablet Take 25 mg by mouth daily.     pravastatin  (PRAVACHOL ) 20 MG tablet Take 20 mg by mouth daily.     dexamethasone  (DECADRON ) 4 MG tablet Take 1 tablet (4 mg total) by mouth daily. 30 tablet 5   ELIQUIS  5 MG TABS tablet TAKE 1 TABLET TWICE A DAY 180 tablet 3   famotidine (PEPCID) 40 MG tablet Take 40 mg by mouth at bedtime.     folic acid (FOLVITE) 1 MG tablet Take 1 mg by mouth daily.     Lactobacillus TABS Take 1 tablet by mouth 2 (two) times daily.     levothyroxine  (SYNTHROID , LEVOTHROID) 75 MCG tablet Take 75 mcg by mouth daily before breakfast.      loratadine  (CLARITIN ) 10 MG tablet Take 10 mg by mouth daily.     LORazepam  (ATIVAN ) 1 MG tablet Take 1 tablet (1 mg total) by mouth every 8 (eight) hours as needed for sleep. Anxiety or Nausea/vomiting 60 tablet 0   metoprolol  succinate (TOPROL  XL) 50 MG 24 hr tablet Take 1 tablet (50 mg total) by mouth daily. 90 tablet 3   NON FORMULARY Take 1  Dose by mouth See admin instructions. MMW: 3 parts Maalox 2 parts Benadryl  1 part viscious lidicaine  Disp. 6oz  Instructions: 5ml swish and swallow every 3-4 hours     omeprazole   (PRILOSEC) 40 MG capsule Take 1 capsule (40 mg total) by mouth 2 (two) times daily. 60 capsule 5   ondansetron  (ZOFRAN -ODT) 4 MG disintegrating tablet Take 1 tablet (4 mg total) by mouth every 8 (eight) hours as needed for nausea or vomiting. 90 tablet 3   polyethylene glycol powder (GLYCOLAX /MIRALAX ) 17 GM/SCOOP powder Take 1 Container by mouth daily.     potassium chloride  SA (KLOR-CON  M) 20 MEQ tablet TAKE 1 TABLET TWICE A DAY 180 tablet 3   Probiotic Product (PROBIOTIC DAILY PO) Take 1 tablet by mouth daily.     prochlorperazine  (COMPAZINE ) 10 MG tablet TAKE 1 TABLET BY MOUTH EVERY 6 HOURS AS NEEDED FOR NAUSEA OR VOMITING. 90 tablet 3   promethazine -dextromethorphan (PROMETHAZINE -DM) 6.25-15 MG/5ML syrup Take 2.5 mLs by mouth every 6 (six) hours as needed for cough. Do not use and drive - May make drowsy. 118 mL 0   sucralfate  (CARAFATE ) 1 g tablet Take 1 tablet (1 g total) by mouth 4 (four) times daily -  with meals and at bedtime. 100 tablet 5   traMADol  (ULTRAM ) 50 MG tablet Take 1 tablet (50 mg total) by mouth every 6 (six) hours as needed. 30 tablet 0   valsartan (DIOVAN) 160 MG tablet Take 160 mg by mouth daily.     No current facility-administered medications for this visit.            [1]  Allergies Allergen Reactions   Doxycycline Rash   Sulfa Antibiotics Other (See Comments) and Rash    Other reaction(s): Other (See Comments)  Made me feel weird   "

## 2024-03-28 ENCOUNTER — Inpatient Hospital Stay: Attending: Hematology and Oncology

## 2024-03-28 ENCOUNTER — Inpatient Hospital Stay

## 2024-03-28 ENCOUNTER — Inpatient Hospital Stay: Admitting: Oncology

## 2024-04-04 ENCOUNTER — Inpatient Hospital Stay: Admitting: Oncology

## 2024-04-04 ENCOUNTER — Inpatient Hospital Stay

## 2024-04-11 ENCOUNTER — Inpatient Hospital Stay

## 2024-04-11 ENCOUNTER — Inpatient Hospital Stay: Admitting: Hematology and Oncology

## 2024-04-16 ENCOUNTER — Ambulatory Visit: Admitting: Podiatry

## 2024-05-10 ENCOUNTER — Ambulatory Visit: Admitting: Cardiology
